# Patient Record
Sex: Male | Born: 1945 | Race: White | Hispanic: No | Marital: Married | Smoking: Former smoker
Health system: Southern US, Community
[De-identification: ages and names within clinical notes are randomized; demographics above are authoritative.]

## PROBLEM LIST (undated history)

## (undated) DIAGNOSIS — K219 Gastro-esophageal reflux disease without esophagitis: Secondary | ICD-10-CM

## (undated) DIAGNOSIS — K649 Unspecified hemorrhoids: Secondary | ICD-10-CM

## (undated) DIAGNOSIS — I10 Essential (primary) hypertension: Secondary | ICD-10-CM

## (undated) DIAGNOSIS — E119 Type 2 diabetes mellitus without complications: Secondary | ICD-10-CM

## (undated) DIAGNOSIS — C801 Malignant (primary) neoplasm, unspecified: Secondary | ICD-10-CM

## (undated) DIAGNOSIS — N400 Enlarged prostate without lower urinary tract symptoms: Secondary | ICD-10-CM

## (undated) DIAGNOSIS — E785 Hyperlipidemia, unspecified: Secondary | ICD-10-CM

## (undated) DIAGNOSIS — G4733 Obstructive sleep apnea (adult) (pediatric): Secondary | ICD-10-CM

## (undated) DIAGNOSIS — Z953 Presence of xenogenic heart valve: Secondary | ICD-10-CM

## (undated) DIAGNOSIS — F329 Major depressive disorder, single episode, unspecified: Secondary | ICD-10-CM

## (undated) DIAGNOSIS — D649 Anemia, unspecified: Secondary | ICD-10-CM

## (undated) DIAGNOSIS — N3010 Interstitial cystitis (chronic) without hematuria: Secondary | ICD-10-CM

## (undated) DIAGNOSIS — E0590 Thyrotoxicosis, unspecified without thyrotoxic crisis or storm: Secondary | ICD-10-CM

## (undated) DIAGNOSIS — K0530 Chronic periodontitis, unspecified: Secondary | ICD-10-CM

## (undated) DIAGNOSIS — K449 Diaphragmatic hernia without obstruction or gangrene: Secondary | ICD-10-CM

## (undated) DIAGNOSIS — K5730 Diverticulosis of large intestine without perforation or abscess without bleeding: Secondary | ICD-10-CM

## (undated) DIAGNOSIS — F32A Depression, unspecified: Secondary | ICD-10-CM

## (undated) DIAGNOSIS — R51 Headache: Secondary | ICD-10-CM

## (undated) DIAGNOSIS — R05 Cough: Secondary | ICD-10-CM

## (undated) DIAGNOSIS — M1990 Unspecified osteoarthritis, unspecified site: Secondary | ICD-10-CM

## (undated) DIAGNOSIS — M109 Gout, unspecified: Secondary | ICD-10-CM

## (undated) DIAGNOSIS — T8203XA Leakage of heart valve prosthesis, initial encounter: Secondary | ICD-10-CM

## (undated) DIAGNOSIS — Z9289 Personal history of other medical treatment: Secondary | ICD-10-CM

## (undated) DIAGNOSIS — K589 Irritable bowel syndrome without diarrhea: Secondary | ICD-10-CM

## (undated) DIAGNOSIS — H269 Unspecified cataract: Secondary | ICD-10-CM

## (undated) DIAGNOSIS — R053 Chronic cough: Secondary | ICD-10-CM

## (undated) DIAGNOSIS — C9000 Multiple myeloma not having achieved remission: Secondary | ICD-10-CM

## (undated) DIAGNOSIS — I350 Nonrheumatic aortic (valve) stenosis: Secondary | ICD-10-CM

## (undated) DIAGNOSIS — J45909 Unspecified asthma, uncomplicated: Secondary | ICD-10-CM

## (undated) DIAGNOSIS — K922 Gastrointestinal hemorrhage, unspecified: Secondary | ICD-10-CM

## (undated) DIAGNOSIS — G4700 Insomnia, unspecified: Secondary | ICD-10-CM

## (undated) DIAGNOSIS — G2581 Restless legs syndrome: Secondary | ICD-10-CM

## (undated) HISTORY — PX: CATARACT EXTRACTION: SUR2

## (undated) HISTORY — DX: Benign prostatic hyperplasia without lower urinary tract symptoms: N40.0

## (undated) HISTORY — DX: Essential (primary) hypertension: I10

## (undated) HISTORY — DX: Unspecified hemorrhoids: K64.9

## (undated) HISTORY — DX: Diaphragmatic hernia without obstruction or gangrene: K44.9

## (undated) HISTORY — PX: HERNIA REPAIR: SHX51

## (undated) HISTORY — DX: Unspecified osteoarthritis, unspecified site: M19.90

## (undated) HISTORY — PX: AORTA - FEMORAL ARTERY BYPASS GRAFT: SUR173

## (undated) HISTORY — DX: Chronic cough: R05.3

## (undated) HISTORY — DX: Gastro-esophageal reflux disease without esophagitis: K21.9

## (undated) HISTORY — PX: REFRACTIVE SURGERY: SHX103

## (undated) HISTORY — DX: Personal history of other medical treatment: Z92.89

## (undated) HISTORY — DX: Depression, unspecified: F32.A

## (undated) HISTORY — PX: NASAL SEPTOPLASTY W/ TURBINOPLASTY: SHX2070

## (undated) HISTORY — DX: Cough: R05

## (undated) HISTORY — PX: OTHER SURGICAL HISTORY: SHX169

## (undated) HISTORY — DX: Leakage of heart valve prosthesis, initial encounter: T82.03XA

## (undated) HISTORY — DX: Unspecified cataract: H26.9

## (undated) HISTORY — DX: Major depressive disorder, single episode, unspecified: F32.9

## (undated) HISTORY — DX: Irritable bowel syndrome, unspecified: K58.9

## (undated) HISTORY — DX: Unspecified asthma, uncomplicated: J45.909

## (undated) HISTORY — PX: CARDIAC SURGERY: SHX584

## (undated) HISTORY — DX: Interstitial cystitis (chronic) without hematuria: N30.10

## (undated) HISTORY — DX: Chronic periodontitis, unspecified: K05.30

## (undated) HISTORY — DX: Gout, unspecified: M10.9

## (undated) HISTORY — DX: Hyperlipidemia, unspecified: E78.5

## (undated) HISTORY — DX: Obstructive sleep apnea (adult) (pediatric): G47.33

## (undated) HISTORY — DX: Insomnia, unspecified: G47.00

## (undated) HISTORY — PX: BUNIONECTOMY: SHX129

---

## 1997-08-13 ENCOUNTER — Encounter: Admission: RE | Admit: 1997-08-13 | Discharge: 1997-08-13 | Payer: Self-pay | Admitting: *Deleted

## 1998-10-18 ENCOUNTER — Emergency Department (HOSPITAL_COMMUNITY): Admission: EM | Admit: 1998-10-18 | Discharge: 1998-10-18 | Payer: Self-pay | Admitting: Emergency Medicine

## 1999-03-30 ENCOUNTER — Inpatient Hospital Stay (HOSPITAL_COMMUNITY): Admission: AD | Admit: 1999-03-30 | Discharge: 1999-03-31 | Payer: Self-pay | Admitting: Cardiology

## 1999-07-09 ENCOUNTER — Encounter: Admission: RE | Admit: 1999-07-09 | Discharge: 1999-07-09 | Payer: Self-pay | Admitting: *Deleted

## 1999-12-10 ENCOUNTER — Inpatient Hospital Stay (HOSPITAL_COMMUNITY): Admission: EM | Admit: 1999-12-10 | Discharge: 1999-12-11 | Payer: Self-pay | Admitting: *Deleted

## 2000-06-07 ENCOUNTER — Encounter: Admission: RE | Admit: 2000-06-07 | Discharge: 2000-06-07 | Payer: Self-pay | Admitting: Family Medicine

## 2000-06-07 ENCOUNTER — Encounter: Payer: Self-pay | Admitting: Family Medicine

## 2002-11-30 ENCOUNTER — Emergency Department (HOSPITAL_COMMUNITY): Admission: EM | Admit: 2002-11-30 | Discharge: 2002-11-30 | Payer: Self-pay | Admitting: Emergency Medicine

## 2004-09-09 ENCOUNTER — Ambulatory Visit: Payer: Self-pay

## 2004-09-10 ENCOUNTER — Ambulatory Visit: Payer: Self-pay | Admitting: Cardiology

## 2004-09-13 ENCOUNTER — Ambulatory Visit: Payer: Self-pay | Admitting: Internal Medicine

## 2004-09-13 ENCOUNTER — Inpatient Hospital Stay (HOSPITAL_BASED_OUTPATIENT_CLINIC_OR_DEPARTMENT_OTHER): Admission: RE | Admit: 2004-09-13 | Discharge: 2004-09-13 | Payer: Self-pay | Admitting: Cardiology

## 2004-09-15 ENCOUNTER — Ambulatory Visit: Payer: Self-pay | Admitting: Cardiology

## 2004-09-30 ENCOUNTER — Encounter (HOSPITAL_COMMUNITY): Admission: RE | Admit: 2004-09-30 | Discharge: 2004-09-30 | Payer: Self-pay | Admitting: Dentistry

## 2004-09-30 ENCOUNTER — Ambulatory Visit: Payer: Self-pay | Admitting: Dentistry

## 2004-10-06 ENCOUNTER — Ambulatory Visit: Payer: Self-pay | Admitting: Dentistry

## 2004-10-13 ENCOUNTER — Inpatient Hospital Stay (HOSPITAL_COMMUNITY)
Admission: RE | Admit: 2004-10-13 | Discharge: 2004-10-18 | Payer: Self-pay | Admitting: Thoracic Surgery (Cardiothoracic Vascular Surgery)

## 2004-10-13 ENCOUNTER — Encounter (INDEPENDENT_AMBULATORY_CARE_PROVIDER_SITE_OTHER): Payer: Self-pay | Admitting: Specialist

## 2004-10-13 HISTORY — PX: AORTIC VALVE REPLACEMENT: SHX41

## 2004-10-22 ENCOUNTER — Ambulatory Visit: Payer: Self-pay | Admitting: Cardiology

## 2004-10-29 ENCOUNTER — Ambulatory Visit: Payer: Self-pay | Admitting: Cardiology

## 2004-10-31 ENCOUNTER — Emergency Department (HOSPITAL_COMMUNITY): Admission: EM | Admit: 2004-10-31 | Discharge: 2004-11-01 | Payer: Self-pay | Admitting: Emergency Medicine

## 2004-11-02 ENCOUNTER — Ambulatory Visit: Payer: Self-pay | Admitting: Internal Medicine

## 2004-11-02 ENCOUNTER — Inpatient Hospital Stay (HOSPITAL_COMMUNITY): Admission: EM | Admit: 2004-11-02 | Discharge: 2004-11-10 | Payer: Self-pay | Admitting: Emergency Medicine

## 2004-11-02 ENCOUNTER — Ambulatory Visit: Payer: Self-pay | Admitting: Cardiology

## 2004-11-03 ENCOUNTER — Encounter: Payer: Self-pay | Admitting: Cardiology

## 2004-11-08 ENCOUNTER — Encounter: Payer: Self-pay | Admitting: Cardiology

## 2004-11-24 ENCOUNTER — Ambulatory Visit: Payer: Self-pay | Admitting: Gastroenterology

## 2004-11-26 ENCOUNTER — Ambulatory Visit: Payer: Self-pay | Admitting: Cardiology

## 2004-12-02 ENCOUNTER — Encounter (HOSPITAL_COMMUNITY): Admission: RE | Admit: 2004-12-02 | Discharge: 2005-03-02 | Payer: Self-pay | Admitting: Cardiology

## 2005-01-03 ENCOUNTER — Ambulatory Visit: Payer: Self-pay | Admitting: Cardiology

## 2005-01-11 ENCOUNTER — Ambulatory Visit: Payer: Self-pay | Admitting: Cardiology

## 2005-02-03 ENCOUNTER — Ambulatory Visit: Payer: Self-pay | Admitting: Cardiology

## 2005-03-03 ENCOUNTER — Encounter (HOSPITAL_COMMUNITY): Admission: RE | Admit: 2005-03-03 | Discharge: 2005-06-01 | Payer: Self-pay | Admitting: Cardiology

## 2005-07-14 ENCOUNTER — Encounter: Admission: RE | Admit: 2005-07-14 | Discharge: 2005-07-14 | Payer: Self-pay | Admitting: Family Medicine

## 2005-07-18 ENCOUNTER — Ambulatory Visit: Payer: Self-pay | Admitting: Cardiology

## 2005-12-05 ENCOUNTER — Encounter: Admission: RE | Admit: 2005-12-05 | Discharge: 2005-12-05 | Payer: Self-pay | Admitting: Family Medicine

## 2005-12-19 ENCOUNTER — Ambulatory Visit: Payer: Self-pay | Admitting: Cardiology

## 2005-12-30 ENCOUNTER — Ambulatory Visit: Payer: Self-pay

## 2005-12-30 ENCOUNTER — Encounter: Payer: Self-pay | Admitting: Cardiology

## 2006-01-23 ENCOUNTER — Encounter: Admission: RE | Admit: 2006-01-23 | Discharge: 2006-01-23 | Payer: Self-pay | Admitting: Family Medicine

## 2006-03-17 ENCOUNTER — Ambulatory Visit: Payer: Self-pay | Admitting: Cardiology

## 2006-04-05 ENCOUNTER — Ambulatory Visit: Payer: Self-pay | Admitting: Emergency Medicine

## 2006-05-08 ENCOUNTER — Encounter: Admission: RE | Admit: 2006-05-08 | Discharge: 2006-05-08 | Payer: Self-pay | Admitting: Family Medicine

## 2006-05-17 ENCOUNTER — Ambulatory Visit: Payer: Self-pay | Admitting: Emergency Medicine

## 2006-06-20 ENCOUNTER — Ambulatory Visit: Payer: Self-pay | Admitting: Emergency Medicine

## 2006-06-27 ENCOUNTER — Ambulatory Visit (HOSPITAL_COMMUNITY): Admission: RE | Admit: 2006-06-27 | Discharge: 2006-06-27 | Payer: Self-pay | Admitting: Emergency Medicine

## 2006-08-01 ENCOUNTER — Ambulatory Visit: Payer: Self-pay | Admitting: Emergency Medicine

## 2006-08-08 ENCOUNTER — Encounter: Admission: RE | Admit: 2006-08-08 | Discharge: 2006-08-08 | Payer: Self-pay | Admitting: Neurology

## 2006-08-10 ENCOUNTER — Ambulatory Visit: Payer: Self-pay | Admitting: Internal Medicine

## 2006-09-08 ENCOUNTER — Ambulatory Visit: Payer: Self-pay | Admitting: Emergency Medicine

## 2006-09-11 ENCOUNTER — Ambulatory Visit: Payer: Self-pay | Admitting: Internal Medicine

## 2006-09-15 ENCOUNTER — Ambulatory Visit: Admission: RE | Admit: 2006-09-15 | Discharge: 2006-09-15 | Payer: Self-pay | Admitting: Emergency Medicine

## 2006-09-15 ENCOUNTER — Ambulatory Visit: Payer: Self-pay | Admitting: Emergency Medicine

## 2006-10-18 ENCOUNTER — Ambulatory Visit: Payer: Self-pay | Admitting: Internal Medicine

## 2006-10-20 ENCOUNTER — Ambulatory Visit: Payer: Self-pay | Admitting: Emergency Medicine

## 2006-11-07 ENCOUNTER — Ambulatory Visit (HOSPITAL_BASED_OUTPATIENT_CLINIC_OR_DEPARTMENT_OTHER): Admission: RE | Admit: 2006-11-07 | Discharge: 2006-11-07 | Payer: Self-pay | Admitting: Emergency Medicine

## 2006-11-18 ENCOUNTER — Ambulatory Visit: Payer: Self-pay | Admitting: Pulmonary Disease

## 2006-11-27 ENCOUNTER — Ambulatory Visit: Payer: Self-pay | Admitting: Internal Medicine

## 2006-12-02 DIAGNOSIS — N400 Enlarged prostate without lower urinary tract symptoms: Secondary | ICD-10-CM

## 2006-12-02 DIAGNOSIS — Z952 Presence of prosthetic heart valve: Secondary | ICD-10-CM | POA: Insufficient documentation

## 2006-12-02 DIAGNOSIS — R05 Cough: Secondary | ICD-10-CM

## 2006-12-02 DIAGNOSIS — M1990 Unspecified osteoarthritis, unspecified site: Secondary | ICD-10-CM | POA: Insufficient documentation

## 2006-12-04 ENCOUNTER — Ambulatory Visit: Payer: Self-pay | Admitting: Emergency Medicine

## 2006-12-05 ENCOUNTER — Encounter: Payer: Self-pay | Admitting: Internal Medicine

## 2006-12-05 ENCOUNTER — Ambulatory Visit (HOSPITAL_COMMUNITY): Admission: RE | Admit: 2006-12-05 | Discharge: 2006-12-05 | Payer: Self-pay | Admitting: Internal Medicine

## 2006-12-08 ENCOUNTER — Ambulatory Visit: Payer: Self-pay | Admitting: Internal Medicine

## 2006-12-13 ENCOUNTER — Ambulatory Visit (HOSPITAL_BASED_OUTPATIENT_CLINIC_OR_DEPARTMENT_OTHER): Admission: RE | Admit: 2006-12-13 | Discharge: 2006-12-13 | Payer: Self-pay | Admitting: Emergency Medicine

## 2006-12-19 ENCOUNTER — Ambulatory Visit: Payer: Self-pay | Admitting: Pulmonary Disease

## 2006-12-27 ENCOUNTER — Ambulatory Visit: Payer: Self-pay | Admitting: Cardiology

## 2006-12-29 ENCOUNTER — Telehealth (INDEPENDENT_AMBULATORY_CARE_PROVIDER_SITE_OTHER): Payer: Self-pay | Admitting: *Deleted

## 2007-01-01 ENCOUNTER — Encounter: Payer: Self-pay | Admitting: Cardiology

## 2007-01-01 ENCOUNTER — Ambulatory Visit: Payer: Self-pay

## 2007-01-12 ENCOUNTER — Telehealth (INDEPENDENT_AMBULATORY_CARE_PROVIDER_SITE_OTHER): Payer: Self-pay | Admitting: *Deleted

## 2007-01-15 ENCOUNTER — Ambulatory Visit: Payer: Self-pay | Admitting: Emergency Medicine

## 2007-01-15 DIAGNOSIS — G4700 Insomnia, unspecified: Secondary | ICD-10-CM | POA: Insufficient documentation

## 2007-01-15 DIAGNOSIS — G4733 Obstructive sleep apnea (adult) (pediatric): Secondary | ICD-10-CM

## 2007-01-15 DIAGNOSIS — J309 Allergic rhinitis, unspecified: Secondary | ICD-10-CM | POA: Insufficient documentation

## 2007-01-23 ENCOUNTER — Ambulatory Visit: Payer: Self-pay | Admitting: Cardiology

## 2007-01-23 LAB — CONVERTED CEMR LAB
BUN: 7 mg/dL (ref 6–23)
CO2: 31 meq/L (ref 19–32)
Calcium: 9.1 mg/dL (ref 8.4–10.5)
GFR calc Af Amer: 111 mL/min
GFR calc non Af Amer: 91 mL/min
Glucose, Bld: 187 mg/dL — ABNORMAL HIGH (ref 70–99)
Potassium: 3.3 meq/L — ABNORMAL LOW (ref 3.5–5.1)

## 2007-01-29 ENCOUNTER — Ambulatory Visit: Payer: Self-pay | Admitting: Cardiology

## 2007-01-29 LAB — CONVERTED CEMR LAB
Calcium: 9.5 mg/dL (ref 8.4–10.5)
Chloride: 95 meq/L — ABNORMAL LOW (ref 96–112)
GFR calc Af Amer: 111 mL/min
GFR calc non Af Amer: 91 mL/min
Glucose, Bld: 145 mg/dL — ABNORMAL HIGH (ref 70–99)
Sodium: 132 meq/L — ABNORMAL LOW (ref 135–145)

## 2007-02-08 DIAGNOSIS — D649 Anemia, unspecified: Secondary | ICD-10-CM

## 2007-02-08 HISTORY — DX: Anemia, unspecified: D64.9

## 2007-02-15 ENCOUNTER — Telehealth: Payer: Self-pay | Admitting: Emergency Medicine

## 2007-03-07 ENCOUNTER — Ambulatory Visit: Payer: Self-pay | Admitting: Cardiology

## 2007-03-07 ENCOUNTER — Observation Stay (HOSPITAL_COMMUNITY): Admission: EM | Admit: 2007-03-07 | Discharge: 2007-03-09 | Payer: Self-pay | Admitting: Emergency Medicine

## 2007-04-17 ENCOUNTER — Ambulatory Visit: Payer: Self-pay | Admitting: Internal Medicine

## 2007-04-25 ENCOUNTER — Telehealth: Payer: Self-pay | Admitting: Emergency Medicine

## 2007-05-16 ENCOUNTER — Ambulatory Visit: Payer: Self-pay | Admitting: Cardiovascular Disease

## 2007-05-16 ENCOUNTER — Ambulatory Visit: Payer: Self-pay | Admitting: Cardiology

## 2007-05-16 LAB — CONVERTED CEMR LAB
Calcium: 9.5 mg/dL (ref 8.4–10.5)
Creatinine, Ser: 1 mg/dL (ref 0.4–1.5)
GFR calc non Af Amer: 81 mL/min
Sodium: 138 meq/L (ref 135–145)

## 2007-06-01 ENCOUNTER — Encounter: Payer: Self-pay | Admitting: Emergency Medicine

## 2007-06-27 ENCOUNTER — Ambulatory Visit: Payer: Self-pay | Admitting: Cardiology

## 2007-07-23 ENCOUNTER — Ambulatory Visit: Payer: Self-pay | Admitting: Internal Medicine

## 2007-07-26 ENCOUNTER — Ambulatory Visit: Payer: Self-pay | Admitting: Cardiology

## 2007-10-28 ENCOUNTER — Emergency Department (HOSPITAL_COMMUNITY): Admission: EM | Admit: 2007-10-28 | Discharge: 2007-10-28 | Payer: Self-pay | Admitting: Emergency Medicine

## 2007-11-09 ENCOUNTER — Ambulatory Visit (HOSPITAL_BASED_OUTPATIENT_CLINIC_OR_DEPARTMENT_OTHER): Admission: RE | Admit: 2007-11-09 | Discharge: 2007-11-09 | Payer: Self-pay | Admitting: Urology

## 2007-11-09 ENCOUNTER — Encounter (INDEPENDENT_AMBULATORY_CARE_PROVIDER_SITE_OTHER): Payer: Self-pay | Admitting: Urology

## 2008-06-02 ENCOUNTER — Telehealth: Payer: Self-pay | Admitting: Cardiology

## 2008-07-02 DIAGNOSIS — I10 Essential (primary) hypertension: Secondary | ICD-10-CM | POA: Insufficient documentation

## 2008-07-02 DIAGNOSIS — K219 Gastro-esophageal reflux disease without esophagitis: Secondary | ICD-10-CM | POA: Insufficient documentation

## 2008-07-02 DIAGNOSIS — E785 Hyperlipidemia, unspecified: Secondary | ICD-10-CM

## 2008-09-19 ENCOUNTER — Inpatient Hospital Stay (HOSPITAL_COMMUNITY): Admission: EM | Admit: 2008-09-19 | Discharge: 2008-09-20 | Payer: Self-pay | Admitting: Emergency Medicine

## 2008-09-24 ENCOUNTER — Ambulatory Visit: Payer: Self-pay | Admitting: Gastroenterology

## 2008-11-20 ENCOUNTER — Telehealth: Payer: Self-pay | Admitting: Cardiology

## 2009-01-05 ENCOUNTER — Observation Stay (HOSPITAL_COMMUNITY): Admission: EM | Admit: 2009-01-05 | Discharge: 2009-01-06 | Payer: Self-pay | Admitting: Emergency Medicine

## 2009-02-09 ENCOUNTER — Ambulatory Visit: Payer: Self-pay | Admitting: Cardiovascular Disease

## 2009-02-10 ENCOUNTER — Ambulatory Visit: Payer: Self-pay | Admitting: Surgery

## 2009-02-10 ENCOUNTER — Inpatient Hospital Stay (HOSPITAL_COMMUNITY): Admission: EM | Admit: 2009-02-10 | Discharge: 2009-02-11 | Payer: Self-pay | Admitting: Emergency Medicine

## 2009-02-10 ENCOUNTER — Encounter (INDEPENDENT_AMBULATORY_CARE_PROVIDER_SITE_OTHER): Payer: Self-pay | Admitting: Internal Medicine

## 2009-03-30 ENCOUNTER — Telehealth: Payer: Self-pay | Admitting: Cardiology

## 2009-03-31 ENCOUNTER — Ambulatory Visit: Payer: Self-pay | Admitting: Cardiology

## 2009-06-29 ENCOUNTER — Telehealth (INDEPENDENT_AMBULATORY_CARE_PROVIDER_SITE_OTHER): Payer: Self-pay | Admitting: *Deleted

## 2009-07-02 ENCOUNTER — Encounter (INDEPENDENT_AMBULATORY_CARE_PROVIDER_SITE_OTHER): Payer: Self-pay | Admitting: *Deleted

## 2009-07-22 ENCOUNTER — Encounter: Payer: Self-pay | Admitting: Cardiology

## 2010-01-05 ENCOUNTER — Encounter (INDEPENDENT_AMBULATORY_CARE_PROVIDER_SITE_OTHER): Payer: Self-pay | Admitting: *Deleted

## 2010-02-28 ENCOUNTER — Encounter: Payer: Self-pay | Admitting: Cardiology

## 2010-03-09 ENCOUNTER — Encounter: Payer: Self-pay | Admitting: Cardiology

## 2010-03-09 NOTE — Progress Notes (Signed)
Summary: b/p machine - irregular heart beat  Phone Note Call from Patient Call back at Home Phone [phone]   Caller: Patient Reason for Call: Talk to Nurse Details for Reason: Per pt calling, pt took his  b/p  by machine , the machine telling pt he's irregular heart beat. bp reading last night 132/83.  Initial call taken by: Lorne Skeens,  March 30, 2009 9:39 AM  Follow-up for Phone Call        spoke with pt, he currently has no insurance and has been seeing the [place]. he is concerned about his bp and heart. he has been using a home monitor and when he checks his heart rate it will say irregular heart beats detected. last night and this am it was not there, he checked his pulse and it feels regular at present. his bp is 115/71. he wants to make a follow up appt to see dr Jens Som. appt made for this week.  Follow-up by: Deliah Goody, RN,  March 30, 2009 11:40 AM

## 2010-03-09 NOTE — Assessment & Plan Note (Signed)
Summary: ROV/F/U BP AND HEART RATE/DM   Primary Provider:  Marcy Panning   History of Present Illness: [name] is a pleasant gentleman who has a history of aortic valve replacement with porcine valve.  His most recent echocardiogram was performed in January of 2011. He had hyperdynamic LV function. There was a mildly elevated gradient across his prosthetic aortic valve of 18 mm of mercury. His right atrium and right ventricle were mildly enlarged.  He did have a CTA of his aorta on July 23, 2007.  There was further evolution of the postsurgical changes, status post aortic valve replacement and grafting.  There was no recurrent aneurysm or dissection.  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  Since I last saw him in June of 2009 he has been admitted with problems with orthostasis and anemia. He occasionally has dyspnea but this is not clearly with exertion. There is no associated chest pain. There is no orthopnea or PND but there is occasional mild pedal edema. He has had occasional syncopal episodes. These typically occur with cough or laughing hard. There was an episode earlier this year when he was having diarrhea and was dehydrated. He is not having palpitations but does note an irregular heart beat on his monitor.  Current Medications (verified): 1)  Cozaar 100 Mg Tabs (Losartan Potassium) .Marland Kitchen.. 1 Tab By Mouth Once Daily 2)  Metoprolol Tartrate 50 Mg Tabs (Metoprolol Tartrate) .... Take One Tablet By Mouth Twice A Day 3)  Zegerid 40-1100 Mg  Caps (Omeprazole-Sodium Bicarbonate) .... Take 1 Capsule By Mouth Two Times A Day 4)  Proair Hfa 108 (90 Base) Mcg/act  Aers (Albuterol Sulfate) .... Inhale 2 Puffs Every 4 Hrs As Needed 5)  Adult Aspirin Low Strength 81 Mg  Tbdp (Aspirin) .... Take 1 Tablet By Mouth Once A Day 6)  Amlodipine Besylate 10 Mg Tabs (Amlodipine Besylate) .... Take One Tablet By Mouth Daily 7)  Acetaminophen 500 Mg  Caps (Acetaminophen) .... 2 Tab By Mouth As Needed 8)  Finasteride 5 Mg Tabs (Finasteride) .... Monthly 9)  Ropinirole Hcl 0.5 Mg Tabs (Ropinirole Hcl) .... 3 Tabs By Mouth At Bedtime 10)  Ferrous Sulfate 325 (65 Fe) Mg  Tabs (Ferrous Sulfate) .Marland Kitchen.. 1 Tab By Mouth Once Daily 11)  Potassium 99 Mg Tabs (Potassium) .Marland Kitchen.. 1 Tab By Mouth Once Daily 12)  Vitamin C 500 Mg Tabs (Ascorbic Acid) .Marland Kitchen.. 1 Tab By Mouth Once Daily 13)  Ketotifen Fumarate 0.025 % Soln (Ketotifen Fumarate) .... Both Eyes 14)  Artificial Tears  Soln (Artificial Tear Solution) .... As Directed 15)  Tussin Dm 100-10 Mg/48ml Syrp (Dextromethorphan-Guaifenesin) .... As Needed  Allergies: 1)  ! Codeine 2)  ! Avodart (Dutasteride) 3)  Codeine Phosphate (Codeine Phosphate)  Past History:  Past Medical History: HYPERTENSION (ICD-401.9) GERD (ICD-530.81) HYPERLIPIDEMIA-MIXED (ICD-272.4) EXTERNAL HEMORRHOIDS (ICD-455.3) INSOMNIA (ICD-780.52) ALLERGIC  RHINITIS (ICD-477.9) OBSTRUCTIVE SLEEP APNEA (ICD-327.23) OSTEOARTHRITIS (ICD-715.90) HIATAL HERNIA (ICD-553.3) AORTIC VALVE REPLACEMENT, HX OF (ICD-V43.3) BENIGN PROSTATIC HYPERTROPHY, HX OF (ICD-V13.8) COUGH, CHRONIC (ICD-786.2) History of cough syncope  Past Surgical History: Reviewed history from 04/30/2007 and no changes required. NASAL SEPTOPLASTY HERNIA REPAIR RT KNEE ARTHROSCOPY BUNIONECTOMY  Social History: Reviewed history from 07/02/2008 and no changes required. Married  Tobacco Use - Former.  Alcohol Use - no Regular Exercise - no Drug Use - no  Review of Systems       Dyspnea and Nonproductive Cough but no fevers or chills, productive cough, hemoptysis, dysphasia, odynophagia,  melena, hematochezia, dysuria, hematuria, rash, seizure activity, orthopnea, PND,  claudication. Remaining systems are negative.   Vital Signs:  Patient profile:   65 year old male Height:      70 inches Weight:      215 pounds BMI:     30.96 Pulse rate:   60 /  minute Resp:     12 per minute BP sitting:   120 / 90  (left arm)  Vitals Entered By: [name] (March 31, 2009 8:41 AM)  Physical Exam  General:  Well-developed well-nourished in no acute distress.  Skin is warm and dry.  HEENT is normal.  Neck is supple. No thyromegaly.  Chest is clear to auscultation with normal expansion.  Cardiovascular exam is regular rate and rhythm. 2-3/6 systolic murmur left sternal border. No diastolic murmur. Abdominal exam nontender or distended. No masses palpated. Extremities show no edema. neuro grossly intact    EKG  Procedure date:  03/31/2009  Findings:      Sinus rhythm at a rate of 60. Occasional PVC. No ST changes.  Impression & Recommendations:  Problem # 1:  HYPERTENSION (ICD-401.9) Blood pressure mildly elevated. He will follow this at home. If his diastolic remains greater than 85 then we will consider adding additional medications. I am hesitant to do this at present given his history of syncope and apparently he has had multiple medication adjustments recently. The following medications were removed from the medication list:    Hydrochlorothiazide 12.5 Mg Tabs (Hydrochlorothiazide) .Marland Kitchen... 1 tab by mouth two times a day His updated medication list for this problem includes:    Cozaar 100 Mg Tabs (Losartan potassium) .Marland Kitchen... 1 tab by mouth once daily    Metoprolol Tartrate 50 Mg Tabs (Metoprolol tartrate) .Marland Kitchen... Take one tablet by mouth twice a day    Adult Aspirin Low Strength 81 Mg Tbdp (Aspirin) .Marland Kitchen... Take 1 tablet by mouth once a day    Amlodipine Besylate 10 Mg Tabs (Amlodipine besylate) .Marland Kitchen... Take one tablet by mouth daily  Problem # 2:  HYPERLIPIDEMIA-MIXED (ICD-272.4) Lipids and liver monitored by primary care. The following medications were removed from the medication list:    Lescol Xl 80 Mg Tb24 (Fluvastatin sodium) .Marland Kitchen... Take 3 tablets once daily  Problem # 3:  AORTIC VALVE REPLACEMENT, HX OF (ICD-V43.3) Recent  echocardiogram noted above. Continue SBE prophylaxis. I will most likely repeat his CT scan in one year when he returns.  Problem # 4:  COUGH SYNCOPE (ICD-786.2) Patient has had problems with this previously. His LV function is normal. His syncope always occurs with coughing or laughing. Treatment would be therapy for cough. His updated medication list for this problem includes:    Metoprolol Tartrate 50 Mg Tabs (Metoprolol tartrate) .Marland Kitchen... Take one tablet by mouth twice a day    Adult Aspirin Low Strength 81 Mg Tbdp (Aspirin) .Marland Kitchen... Take 1 tablet by mouth once a day    Amlodipine Besylate 10 Mg Tabs (Amlodipine besylate) .Marland Kitchen... Take one tablet by mouth daily  Problem # 5:  BENIGN PROSTATIC HYPERTROPHY, HX OF (ICD-V13.8)  Problem # 6:  OBSTRUCTIVE SLEEP APNEA (ICD-327.23)  Problem # 7:  GERD (ICD-530.81)  His updated medication list for this problem includes:    Zegerid 40-1100 Mg Caps (Omeprazole-sodium bicarbonate) .Marland Kitchen... Take 1 capsule by mouth two times a day  Patient Instructions: 1)  Your physician recommends that you schedule a follow-up appointment in: ONE YEAR

## 2010-03-09 NOTE — Progress Notes (Signed)
  Walk in Patient Form Recieved " Pt left Self typed Letter" sent to Message Nurse" [facility]  Jun 29, 2009 10:57 AM

## 2010-03-09 NOTE — Letter (Signed)
Summary: Colonoscopy Letter  [facility]  [address] [place], [place] [address]   Phone: [phone]  Fax: [phone]      January 05, 2010 MRN: [id number]   LAMERE LIGHTNER [address] [place], [place]  [address]   Dear Mr. Galeana,   According to your medical record, it is time for you to schedule a Colonoscopy. The American Cancer Society recommends this procedure as a method to detect early colon cancer. Patients with a family history of colon cancer, or a personal history of colon polyps or inflammatory bowel disease are at increased risk.  This letter has been generated based on the recommendations made at the time of your procedure. If you feel that in your particular situation this may no longer apply, please contact our office.  Please call our office at [phone] to schedule this appointment or to update your records at your earliest convenience.  Thank you for cooperating with [place] to provide you with the very best care possible.   Sincerely,   Iva Boop, M.D.  [facility] [phone]

## 2010-03-09 NOTE — Letter (Signed)
Summary: Generic Letter  [facility] Main Office  [address]   [place], [place] [address]   Phone: [phone]  Fax: [phone]        Jul 02, 2009 MRN: [id number]    [name] [address] [place], [place]  [address]    To Whom it May Concern,          [name] does not carry a diagnosis of Ischemic Heart Disease. His cardiac catherization in 2006, at the time of his valve replacement surgery, showed normal coronary arteries. Please contact us with any questions or concerns.   Sincerely,  Deliah Goody, RN/Dr [name]

## 2010-04-25 LAB — POCT CARDIAC MARKERS
CKMB, poc: 4.4 ng/mL (ref 1.0–8.0)
Myoglobin, poc: 123 ng/mL (ref 12–200)
Troponin i, poc: <0.05 ng/mL (ref 0.00–0.09)

## 2010-04-25 LAB — GLUCOSE, CAPILLARY
Glucose-Capillary: 100 mg/dL — ABNORMAL HIGH (ref 70–99)
Glucose-Capillary: 112 mg/dL — ABNORMAL HIGH (ref 70–99)
Glucose-Capillary: 113 mg/dL — ABNORMAL HIGH (ref 70–99)
Glucose-Capillary: 121 mg/dL — ABNORMAL HIGH (ref 70–99)
Glucose-Capillary: 123 mg/dL — ABNORMAL HIGH (ref 70–99)
Glucose-Capillary: 133 mg/dL — ABNORMAL HIGH (ref 70–99)
Glucose-Capillary: 133 mg/dL — ABNORMAL HIGH (ref 70–99)
Glucose-Capillary: 141 mg/dL — ABNORMAL HIGH (ref 70–99)

## 2010-04-25 LAB — DIFFERENTIAL
Basophils Absolute: 0 10*3/uL (ref 0.0–0.1)
Eosinophils Relative: 0 % (ref 0–5)
Lymphocytes Relative: 13 % (ref 12–46)
Lymphs Abs: 1.1 10*3/uL (ref 0.7–4.0)
Neutro Abs: 7.2 10*3/uL (ref 1.7–7.7)
Neutrophils Relative %: 82 % — ABNORMAL HIGH (ref 43–77)

## 2010-04-25 LAB — CBC
HCT: 35.4 % — ABNORMAL LOW (ref 39.0–52.0)
Hemoglobin: 12.5 g/dL — ABNORMAL LOW (ref 13.0–17.0)
MCHC: 35.2 g/dL (ref 30.0–36.0)
Platelets: 115 10*3/uL — ABNORMAL LOW (ref 150–400)
RBC: 4.03 MIL/uL — ABNORMAL LOW (ref 4.22–5.81)
RDW: 13.4 % (ref 11.5–15.5)
RDW: 13.9 % (ref 11.5–15.5)
WBC: 7.2 10*3/uL (ref 4.0–10.5)
WBC: 8.8 10*3/uL (ref 4.0–10.5)

## 2010-04-25 LAB — CARDIAC PANEL(CRET KIN+CKTOT+MB+TROPI)
CK, MB: 7 ng/mL (ref 0.3–4.0)
CK, MB: 7.6 ng/mL (ref 0.3–4.0)
Relative Index: 3.8 — ABNORMAL HIGH (ref 0.0–2.5)
Total CK: 177 U/L (ref 7–232)
Total CK: 264 U/L — ABNORMAL HIGH (ref 7–232)
Troponin I: 0.02 ng/mL (ref 0.00–0.06)

## 2010-04-25 LAB — FECAL LACTOFERRIN, QUANT: Fecal Lactoferrin: NEGATIVE

## 2010-04-25 LAB — URINALYSIS, ROUTINE W REFLEX MICROSCOPIC
Glucose, UA: NEGATIVE mg/dL
Ketones, ur: NEGATIVE mg/dL
Protein, ur: NEGATIVE mg/dL
Urobilinogen, UA: 1 mg/dL (ref 0.0–1.0)

## 2010-04-25 LAB — BASIC METABOLIC PANEL
CO2: 26 mEq/L (ref 19–32)
CO2: 28 mEq/L (ref 19–32)
Chloride: 102 mEq/L (ref 96–112)
GFR calc Af Amer: >60 mL/min (ref >60)
Glucose, Bld: 120 mg/dL — ABNORMAL HIGH (ref 70–99)
Potassium: 3.5 mEq/L (ref 3.5–5.1)
Sodium: 134 mEq/L — ABNORMAL LOW (ref 135–145)
Sodium: 136 mEq/L (ref 135–145)

## 2010-04-25 LAB — CK TOTAL AND CKMB (NOT AT ARMC)
CK, MB: 6.9 ng/mL (ref 0.3–4.0)
Relative Index: 5.2 — ABNORMAL HIGH (ref 0.0–2.5)
Total CK: 133 U/L (ref 7–232)

## 2010-04-25 LAB — CULTURE, BLOOD (ROUTINE X 2)
Culture: NO GROWTH
Culture: NO GROWTH

## 2010-04-25 LAB — POCT I-STAT, CHEM 8
BUN: 9 mg/dL (ref 6–23)
Calcium, Ion: 1.16 mmol/L (ref 1.12–1.32)
Chloride: 96 mEq/L (ref 96–112)
Creatinine, Ser: 0.9 mg/dL (ref 0.4–1.5)
Glucose, Bld: 166 mg/dL — ABNORMAL HIGH (ref 70–99)
HCT: 47 % (ref 39.0–52.0)
Hemoglobin: 16 g/dL (ref 13.0–17.0)
Potassium: 3.5 mEq/L (ref 3.5–5.1)
Sodium: 133 mEq/L — ABNORMAL LOW (ref 135–145)
TCO2: 28 mmol/L (ref 0–100)

## 2010-04-25 LAB — CLOSTRIDIUM DIFFICILE EIA: C difficile Toxins A+B, EIA: NEGATIVE

## 2010-04-25 LAB — HEMOGLOBIN A1C
Hgb A1c MFr Bld: 7 % — ABNORMAL HIGH (ref 4.6–6.1)
Mean Plasma Glucose: 154 mg/dL

## 2010-04-25 LAB — STOOL CULTURE

## 2010-04-25 LAB — HIV ANTIBODY (ROUTINE TESTING W REFLEX): HIV: NONREACTIVE

## 2010-04-25 LAB — TROPONIN I: Troponin I: 0.03 ng/mL (ref 0.00–0.06)

## 2010-05-12 LAB — URINALYSIS, ROUTINE W REFLEX MICROSCOPIC
Bilirubin Urine: NEGATIVE
Glucose, UA: NEGATIVE mg/dL
Hgb urine dipstick: NEGATIVE
Ketones, ur: NEGATIVE mg/dL
Protein, ur: NEGATIVE mg/dL

## 2010-05-12 LAB — CBC
MCHC: 34 g/dL (ref 30.0–36.0)
Platelets: 157 10*3/uL (ref 150–400)
RBC: 4.63 MIL/uL (ref 4.22–5.81)
RBC: 4.83 MIL/uL (ref 4.22–5.81)
WBC: 7.8 10*3/uL (ref 4.0–10.5)

## 2010-05-12 LAB — DIFFERENTIAL
Eosinophils Absolute: 0.1 10*3/uL (ref 0.0–0.7)
Eosinophils Relative: 2 % (ref 0–5)
Lymphs Abs: 1.6 10*3/uL (ref 0.7–4.0)

## 2010-05-12 LAB — COMPREHENSIVE METABOLIC PANEL
ALT: 37 U/L (ref 0–53)
AST: 44 U/L — ABNORMAL HIGH (ref 0–37)
CO2: 30 mEq/L (ref 19–32)
Calcium: 9.5 mg/dL (ref 8.4–10.5)
Chloride: 95 mEq/L — ABNORMAL LOW (ref 96–112)
GFR calc Af Amer: >60 mL/min (ref >60)
GFR calc non Af Amer: >60 mL/min (ref >60)
Sodium: 133 mEq/L — ABNORMAL LOW (ref 135–145)
Total Bilirubin: 0.6 mg/dL (ref 0.3–1.2)

## 2010-05-12 LAB — POCT CARDIAC MARKERS
CKMB, poc: 2.7 ng/mL (ref 1.0–8.0)
Myoglobin, poc: 127 ng/mL (ref 12–200)
Troponin i, poc: <0.05 ng/mL (ref 0.00–0.09)

## 2010-05-12 LAB — BASIC METABOLIC PANEL
CO2: 28 mEq/L (ref 19–32)
Calcium: 9.1 mg/dL (ref 8.4–10.5)
Chloride: 100 mEq/L (ref 96–112)
GFR calc Af Amer: >60 mL/min (ref >60)
Sodium: 135 mEq/L (ref 135–145)

## 2010-05-12 LAB — POCT I-STAT, CHEM 8
Chloride: 92 mEq/L — ABNORMAL LOW (ref 96–112)
Glucose, Bld: 153 mg/dL — ABNORMAL HIGH (ref 70–99)
HCT: 44 % (ref 39.0–52.0)
Potassium: 3.4 mEq/L — ABNORMAL LOW (ref 3.5–5.1)
Sodium: 133 mEq/L — ABNORMAL LOW (ref 135–145)

## 2010-05-12 LAB — HEMOCCULT GUIAC POC 1CARD (OFFICE): Fecal Occult Bld: POSITIVE

## 2010-05-12 LAB — PROTIME-INR: Prothrombin Time: 13.1 seconds (ref 11.6–15.2)

## 2010-05-12 LAB — CROSSMATCH: Antibody Screen: NEGATIVE

## 2010-05-12 LAB — CARDIAC PANEL(CRET KIN+CKTOT+MB+TROPI)
CK, MB: 3.9 ng/mL (ref 0.3–4.0)
Total CK: 113 U/L (ref 7–232)
Total CK: 123 U/L (ref 7–232)
Troponin I: 0.02 ng/mL (ref 0.00–0.06)
Troponin I: 0.03 ng/mL (ref 0.00–0.06)

## 2010-05-12 LAB — HEMOGLOBIN A1C: Mean Plasma Glucose: 148 mg/dL

## 2010-05-16 LAB — COMPREHENSIVE METABOLIC PANEL
ALT: 33 U/L (ref 0–53)
AST: 47 U/L — ABNORMAL HIGH (ref 0–37)
Alkaline Phosphatase: 61 U/L (ref 39–117)
BUN: 13 mg/dL (ref 6–23)
CO2: 27 mEq/L (ref 19–32)
CO2: 29 mEq/L (ref 19–32)
Calcium: 9.1 mg/dL (ref 8.4–10.5)
Chloride: 92 mEq/L — ABNORMAL LOW (ref 96–112)
GFR calc Af Amer: >60 mL/min (ref >60)
GFR calc non Af Amer: >60 mL/min (ref >60)
GFR calc non Af Amer: >60 mL/min (ref >60)
Glucose, Bld: 160 mg/dL — ABNORMAL HIGH (ref 70–99)
Glucose, Bld: 167 mg/dL — ABNORMAL HIGH (ref 70–99)
Sodium: 129 mEq/L — ABNORMAL LOW (ref 135–145)
Total Bilirubin: 0.8 mg/dL (ref 0.3–1.2)
Total Protein: 6.3 g/dL (ref 6.0–8.3)

## 2010-05-16 LAB — MAGNESIUM: Magnesium: 1.8 mg/dL (ref 1.5–2.5)

## 2010-05-16 LAB — URINALYSIS, ROUTINE W REFLEX MICROSCOPIC
Bilirubin Urine: NEGATIVE
Glucose, UA: NEGATIVE mg/dL
Hgb urine dipstick: NEGATIVE
Ketones, ur: NEGATIVE mg/dL
Protein, ur: NEGATIVE mg/dL

## 2010-05-16 LAB — CROSSMATCH

## 2010-05-16 LAB — CBC
HCT: 27.4 % — ABNORMAL LOW (ref 39.0–52.0)
Hemoglobin: 9 g/dL — ABNORMAL LOW (ref 13.0–17.0)
Hemoglobin: 9.2 g/dL — ABNORMAL LOW (ref 13.0–17.0)
MCHC: 32.5 g/dL (ref 30.0–36.0)
MCHC: 32.8 g/dL (ref 30.0–36.0)
MCHC: 33.2 g/dL (ref 30.0–36.0)
MCV: 71 fL — ABNORMAL LOW (ref 78.0–100.0)
MCV: 72.7 fL — ABNORMAL LOW (ref 78.0–100.0)
Platelets: 206 10*3/uL (ref 150–400)
RBC: 3.86 MIL/uL — ABNORMAL LOW (ref 4.22–5.81)
RBC: 3.95 MIL/uL — ABNORMAL LOW (ref 4.22–5.81)
RDW: 16.9 % — ABNORMAL HIGH (ref 11.5–15.5)
RDW: 17.5 % — ABNORMAL HIGH (ref 11.5–15.5)
WBC: 7.9 10*3/uL (ref 4.0–10.5)
WBC: 8 10*3/uL (ref 4.0–10.5)

## 2010-05-16 LAB — CARDIAC PANEL(CRET KIN+CKTOT+MB+TROPI)
CK, MB: 13.8 ng/mL — ABNORMAL HIGH (ref 0.3–4.0)
CK, MB: 15.4 ng/mL — ABNORMAL HIGH (ref 0.3–4.0)
Total CK: 366 U/L — ABNORMAL HIGH (ref 7–232)
Total CK: 378 U/L — ABNORMAL HIGH (ref 7–232)
Troponin I: 0.03 ng/mL (ref 0.00–0.06)

## 2010-05-16 LAB — BASIC METABOLIC PANEL
Chloride: 96 mEq/L (ref 96–112)
Creatinine, Ser: 0.95 mg/dL (ref 0.4–1.5)
GFR calc Af Amer: >60 mL/min (ref >60)
Potassium: 3.2 mEq/L — ABNORMAL LOW (ref 3.5–5.1)
Sodium: 131 mEq/L — ABNORMAL LOW (ref 135–145)

## 2010-05-16 LAB — CK TOTAL AND CKMB (NOT AT ARMC)
CK, MB: 11.7 ng/mL — ABNORMAL HIGH (ref 0.3–4.0)
Relative Index: 3.7 — ABNORMAL HIGH (ref 0.0–2.5)

## 2010-05-16 LAB — ABO/RH: ABO/RH(D): A NEG

## 2010-05-16 LAB — DIFFERENTIAL
Basophils Relative: 0 % (ref 0–1)
Eosinophils Absolute: 0.1 10*3/uL (ref 0.0–0.7)
Lymphs Abs: 0.9 10*3/uL (ref 0.7–4.0)
Monocytes Relative: 7 % (ref 3–12)
Neutro Abs: 5.8 10*3/uL (ref 1.7–7.7)
Neutrophils Relative %: 80 % — ABNORMAL HIGH (ref 43–77)

## 2010-05-16 LAB — APTT: aPTT: 26 seconds (ref 24–37)

## 2010-05-16 LAB — PROTIME-INR
INR: 1 (ref 0.00–1.49)
Prothrombin Time: 13.5 seconds (ref 11.6–15.2)

## 2010-05-16 LAB — CORTISOL-AM, BLOOD: Cortisol - AM: 6.6 ug/dL (ref 4.3–22.4)

## 2010-06-22 NOTE — Assessment & Plan Note (Signed)
[name], [name]                     MRN:          [id number]  DATE:08/10/2006                            DOB:          1945/12/12    CHIEF COMPLAINT:  Change in bowel habits, also reflux and cough,  referred by Dr. Delton Coombes.   ASSESSMENT:  [name] has constipation, straining the stool, and  smaller caliber bowel movements. He may have had this problem in the  past. His last colonoscopy was in 2004 by Dr. Corinda Gubler. It showed  external hemorrhoids and otherwise was unremarkable. He had some mild  reflux esophagitis changes and hiatal hernia on an EGD at that time as  well.   His other problem has been a chronic cough for many years, which is  apparently getting better on Zegerid. He still has some problems. He  does not have any particular heartburn at this time.   Please see my medical history and physical form for further details for  the assessment, history, etcetera.   PLAN:  1. I recommended a colonoscopy, but he declined. We will try fiber      supplements.  2. I have checked a TSH that is normal.  3. He will return to see me in one month. He understands that he could      have colon cancer causing this problem, but does not wish to      perform a colonoscopy at this time.  4. Regarding reflux and cough, I think that he should continue the      Zegerid and I will reassess that when he returns.   PROBLEMS:  Note that he thinks a lot of his symptoms of his bowel habits  occurred after his open heart surgery. His other problems include aortic  valve and partial aortic graft by Dr. Cornelius Moras. Original problem aortic  stenosis.  1. Hypertension.  2. Dyslipidemia.  3. Irritable bowel syndrome.  4. Hiatal hernia.  5. Benign prostatic hypertrophy.  6. Hemorrhoids.  7. Osteoarthritis.  8. Prior nasal septoplasty.  9. Prior hernia repair.  10.Right knee arthroscopy.  11.Hernia repair was bilateral inguinal many years ago.  12.Bunionectomy.  13.He is currently having problems with what sounds like cough      syncope.   MEDICATIONS:  Are listed and reviewed on the chart.   I appreciate the opportunity to care for this patient.     Iva Boop, MD,FACG  Electronically Signed    CEG/MedQ  DD: 08/10/2006  DT: 08/11/2006  Job #: [id number]   cc:   Donia Guiles, M.D.  Leslye Peer, MD

## 2010-06-22 NOTE — Consult Note (Signed)
[name], [name]              ACCOUNT NO.:  [id number]   MEDICAL RECORD NO.:  [id number]          PATIENT TYPE:  OBV   LOCATION:  2040                         FACILITY:  [facility]   PHYSICIAN:  Michiel Cowboy, MDDATE OF BIRTH:  1945-08-23   DATE OF CONSULTATION:  DATE OF DISCHARGE:                                 CONSULTATION   PRIMARY CARE PHYSICIAN:  Donia Guiles, M.D.   REQUESTING PHYSICIAN:  Rollene Rotunda, MD, [facility].   REASON FOR CONSULTATION:  Medical management of headache, urinary tract  infection, and hyponatremia.   Patient is a 65 year old gentleman whose past medical history is  significant for aortic valve replacement with porcine valve secondary to  aortic stenosis, which was done in 2006.  Patient was in regular state  of health up until about a week ago or so when he presented initially to  Dr. Clovis Riley and then subsequently to Dr. Arvilla Market with complaints of  burning with urination.  The patient was prescribed ciprofloxacin and  Pyridium.  When the patient tried to take his dose of Pyridium, he  developed diarrhea and then nausea and vomiting, which he attributed to  Pyridium.  Thereafter, the patient proceeded to still have dry heaves,  even though he discontinued his Pyridium.  On the day of admission, the  patient developed chest pain and pressure as well as numbness in the  left hand and presented to the emergency department.  Other than that,  the patient has also been complaining over the past 3-4 days of  headache, which he describes as a heavy sensation on his head as well as  pressure and congestion in his sinuses.  Otherwise, he denies any  fevers, chills.  He reports a cough, which is chronic.  Chest pain, as  above.  He reports dysuria, as described.  Overall weakness.  Denies any  bright red blood per rectum or melena.  At this time of evaluation, the  patient's chest pain, nausea and vomiting have completely resolved, but  he still has some  headache.   PAST MEDICAL HISTORY:  1. Significant for aortic valve replacement with porcine tissue valve      secondary to aortic stenosis.  2. Hypertension.  3. Hyperlipidemia.  4. Irritable bowel syndrome.  5. Hiatal hernia.  6. BPH.  7. Nephritis.  8. Sleep apnea.   ALLERGIES:  CODEINE, AVODART.   MEDICATIONS:  1. Amlodipine 5 mg p.o. daily.  2. Hyzaar 100/25 mg p.o. daily.  3. Toprol 100 mg p.o. daily.  4. Lescol 80 mg p.o. daily.  5. Zegerid 40 mg b.i.d.  6. ProAire inhaler 90 mg 2 puffs q.4h. as needed.  7. Nasacort 2 sprays per nostril a day.  8. Vicodin as needed for pain.  9. Potassium 20 mEq p.o. daily.  10.Loratadine 10 mg p.o. daily.  11.Nystatin cream.  12.Cipro 500 mg b.i.d. x10 days, recently prescribed.  13.Pyridium, recently prescribed.  14.Aspirin 81 mg p.o. daily.  15.Diazepam.  Patient is unsure of the dose but states this recently      has been prescribed for insomnia.  He is not currently  taking.   REVIEW OF SYSTEMS:  As per HPI.   SOCIAL HISTORY:  Patient is married.  Used to smoke for three years,  then quit about 35 years ago.  Patient used to drink but quit 18 years  ago.   FAMILY HISTORY:  Noncontributory.   PHYSICAL EXAMINATION:  VITALS:  Temperature 97.7, pulse 82, respirations  22, blood pressure 163/103.  Satting 96% on room air.  GENERAL:  No acute distress.  Dry mucous membranes.  Somewhat decreased  turgor.  LUNGS:  Clear to auscultation bilaterally but decreased breath sounds  bilaterally.  NECK:  No JVD can be appreciated,  but neck is obese.  HEART:  A 2/6 systolic murmur noted.  ABDOMEN:  Soft, nontender, nondistended.  GU:  No ulcers noted on the genitalia or the urethral orifices.  No  discharge noted.  NEUROLOGIC:  No acute abnormalities.  Nonfocal.   STUDIES:  CT scan showed old frontal ischemia but no acute changes.   Chest x-ray showed decreased inspiration bilaterally.  Poor air volume.   EKG:  Heart rate 57,  prolonged QTC.   LABS:  White blood cell count 11.8, hemoglobin 11.4, sodium 121,  potassium 3.5, bicarb 26, creatinine 0.9.  MB 5.6.  Troponin less than  0.05.  Urine shows positive nitrites but no white blood cells.   ASSESSMENT/PLAN:  This is a 65 year old gentleman admitted by cardiology  for observation with chest pain.  1. Hyponatremia, likely secondary to dehydration.  Gives a recent      history of nausea and vomiting.  Will rehydrate.  Check      orthostatics.  Check urine lytes.  Hold hydrochlorothiazide.  At      this point, no acute neurological changes.  Will continue to      monitor closely and recheck sodium every 6 hours to avoid over-      correction.  2. Urinary tract infection:  UA positive for nitrites.  Urine culture      pending.  Would not treat with ciprofloxacin, given the QTC      prolongation.  Instead try Macrobid 100 mg p.o. b.i.d.  Will adjust      as needed pending the studies.  3. Hypertension:  Will continue home meds except for      hydrochlorothiazide, which I will hold.  4. History of aortic stenosis, as per cardiology.  5. Chest pain, as per cardiology.  6. History of alcohol abuse:  Currently stable.  Has not drinking for      the past 18 years.  Liver function tests within normal limits.  7. Headache:  Could be possibly secondary to dehydration versus sinus      headache.  Continue home Nasacort.  Agree with Vicodin as needed      p.r.n. pain.   Thank you for this interesting consult.  Will continue to follow.      Michiel Cowboy, MD  Electronically Signed     AVD/MEDQ  D:  03/07/2007  T:  03/07/2007  Job:  [id number]

## 2010-06-22 NOTE — Assessment & Plan Note (Signed)
[facility]                         GASTROENTEROLOGY OFFICE NOTE   NAME:PUCKETTHilton, Saephan                     MRN:          [id number]  DATE:10/18/2006                            DOB:          Jun 13, 1945    PROBLEM LIST:  See previously dictated list.   Mr. Kunz returns.  His cough is doing pretty well.  He still has  spells of reflux, however.  He still remains constipated, but no  bleeding.  He says he feels like he needs a clean out.  Dr. Delton Coombes  performed a bronchoscopy.  He found collapse of the right-sided airways  which etiology was not clear.  He is using FiberCon with 1-2 bowel  movements a week.  He may or may not have tried MiraLax.  He has a great  deal of trouble with liquids and things like Metamucil, but I have  explained to him that MiraLax is not quite like that.   OBJECTIVE:  VITAL SIGNS:  Weight 217, pulse 64, blood pressure 123/80.   ASSESSMENT:  1. Reflux disease with cough, airway and breathing problems.  Etiology      not clear.  Dr. Delton Coombes has questioned whether or not his collapse is      related to a connective tissue disease.  I suppose reflux and      aspiration or other reflux mediated lung disease could be possible      as well.  He still has some breakthrough heartburn.  Maybe, he is      not on adequate control.  2. Constipation persists.   PLAN:  1. Will discuss with Dr. Delton Coombes.  I think EGD and pH probe testing      could be indicated.  2. I still think he needs a colonoscopy.  I suspect that it is not      anything serious.  He had a colonoscopy in 2004, but this is      necessary.  I am holding off because of the other workup that is      needed, particularly with the airway.  We will come up with a plan      once I discuss with Dr. Delton Coombes.  3. He is to try magnesium citrate and then MiraLax once or twice a day      after the magnesium citrate, plus or minus Dulcolax as used to try      and get him started.   Further plans pending that.  4. Modified barium swallow was normal.     Iva Boop, MD,FACG  Electronically Signed    CEG/MedQ  DD: 10/18/2006  DT: 10/19/2006  Job #: [id number]   cc:   Leslye Peer, MD  Donia Guiles, M.D.  Madolyn Frieze Jens Som, MD, [facility]

## 2010-06-22 NOTE — Assessment & Plan Note (Signed)
[facility]                             PULMONARY OFFICE NOTE   NAME:[name], [name]                     MRN:          [id number]  DATE:06/20/2006                            DOB:          [birth year]    SUBJECTIVE:  [name] is a 65 year old man with a history of  hypertension, aortic valve replacement, and hyperlipidemia.  I have been  following him for unrelenting cough.  I initially believed that the  driving force behind his cough was uncontrolled GERD.  He also has  airflow limitation with a bronchodilator responsiveness.  For these  reasons, I have started him on Zegerid twice a day, and also we  initiated a trail of Symbicort 160/4.5 mcg 2 puffs b.i.d.  Since our  last visit, he tells me that his GERD symptoms may be somewhat improved,  but that his cough is no better.  He continues to have dry cough.  Sometimes he has paroxysms of cough that can lead to presyncope.  On at  least 1 occasion he has had a syncopal episode.  He denies any  significant postnasal drip, although he does occasionally have a tickle  in the back of his throat.  He also has had some itchy eyes and runny  eyes.  His wife wonders whether this has been worse since the pollen  season over the last 2-3 weeks.  Of note, he also endures some  aspiration-type symptoms with worsening cough when he eats or drinks.  He has noticed it occasionally when he is using a cough drop or candy  that some of the medication goes down the wrong way.   CURRENT MEDICATIONS:  1. Hyzaar 100/25 mg once daily.  2. Metoprolol 100 mg daily.  3. Lescol XL 80 mg daily.  4. Aspirin 81 mg daily.  5. Alleve p.r.n.  6. Omega-3 fatty acids 1200 mg daily.  7. Zegerid 40/1100 mg b.i.d.  8. Symbicort 160/4.5 mcg 2 puffs b.i.d.  9. ProAir 2 puffs every 4 hours p.r.n. for shortness of breath.   PHYSICAL EXAMINATION:  IN GENERAL:  This is a pleasant, obese gentleman  who is in no distress on room air.   He is coughing frequently throughout  the exam.  His weight is 217 pounds, temperature 98.1, blood pressure 132/88, heart  rate 69, SPO2 98% on room air.  HEENT EXAM:  He has some dysarthria and some mild posterior pharyngeal  erythema.  His posterior pharynx is crowded.  His lungs are distant but clear.  HEART:  Regular rate and rhythm without murmur.  ABDOMEN:  Obese, soft, nontender with positive bowel sounds.  EXTREMITIES:  No cyanosis, clubbing, or edema.   IMPRESSION:  Chronic cough.  The contributors to this likely include  gastroesophageal reflux disease, which appears to be better controlled,  mild airflow limitation superimposed on some mild restrictive disease,  and, finally, some suspected allergic rhinitis.  I am also concerned  that he may have some oropharyngeal dysphagia, given his dysarthria and  also his complaints of coughing with eating and drinking.   PLAN:  1. I will  discontinue his Symbicort as I believe this may be      exacerbating his posterior oropharyngeal irritation.  2. Start Claritin once daily.  3. Nasacort 2 sprays each nostril daily.  4. Continue Zegerid 40/1100 mg b.i.d.  5. I will obtain a swallowing evaluation and a modified barium swallow      to insure that he does not have occult aspiration contributing to      his cough.  6. I will follow up with [name] in 4-6 weeks to review the      results of the above and to assess his progress on this regimen.     Leslye Peer, MD  Electronically Signed    RSB/MedQ  DD: 06/20/2006  DT: 06/20/2006  Job #: [id number]   cc:   Madolyn Frieze. Jens Som, MD, [facility]  Donia Guiles, M.D.

## 2010-06-22 NOTE — Discharge Summary (Signed)
[name], [name]              ACCOUNT NO.:  [id number]   MEDICAL RECORD NO.:  [id number]          PATIENT TYPE:  OBV   LOCATION:  2040                         FACILITY:  [facility]   PHYSICIAN:  Madolyn Frieze. Jens Som, MD, FACCDATE OF BIRTH:  1945-03-11   DATE OF ADMISSION:  03/07/2007  DATE OF DISCHARGE:  03/09/2007                               DISCHARGE SUMMARY   CARDIOLOGIST:  Madolyn Frieze. Jens Som, MD, [facility]   PRIMARY CARE PHYSICIAN:  Donia Guiles, M.D.   PULMONARY:  Leslye Peer, M.D.   UROLOGYLynelle Smoke I. Patsi Sears, M.D.   DISCHARGE DIAGNOSES:  1. Chest pain.  Negative cardiac workup this admission.  No further      cardiac workup planned.  2. Status post aortic valve replacement in 2006 secondary to aortic      stenosis.  The patient has a porcine valve.  3. Hyponatremia during this hospitalization.  4. Mild elevation in CK.  5. Hypertension.  6. Dysuria, previously treated with Cipro and Pyridium.  Pyridium      stopped secondary to extreme nausea and vomiting.  The patient      currently being treated with Macrodantin.   PAST MEDICAL HISTORY:  Hyperlipidemia, irritable bowel syndrome, hiatal  hernia, BPH, arthritis, sleep apnea with an NPS study in November 2008;  however, the patient does not wear CPAP.  Status post cardiac  catheterization showing no coronary artery disease.  Last echocardiogram  November 2008 showed EF 55% to 65% without wall motion abnormalities.  Mild LVH, mild aortic root dilatation.  Bioprosthetic aortic valve.  Recent urinary tract infection, from which the patient continues to have  symptoms.  Prolonged QTc.   HOSPITAL COURSE:  [name] is a 65 year old Caucasian gentleman who  presented to [facility] emergency room complaining of chest discomfort  without associated symptoms.  The patient was complaining, however, of  diarrhea and nausea, dry heaves after taking his first dose of Pyridium.  The patient had previously been prescribed  Cipro and Pyridium by primary  care doctor for a urinary tract infection.  In the emergency room, he  received sublingual nitroglycerin, morphine and Zofran.  Te patient was  admitted for observation.  Initial point-of-care markers and EKGs  unremarkable.  It was not felt to be cardiac in origin.  The patient  also found to be hyponatremic with a sodium of 121 and  hypokalemic with  potassium of 3.5.  Medicine was asked to consult and saw the patient on  January 28 for dysuria, recent UTI.  Recommended avoiding Cipro with  prolonged QTc, would try Macrodantin, which was initiated.  The patient  tolerated without side effects.  Cardiac enzymes:  CK elevated at 426,  517 and 426.  Troponins negative x3 sets.  Telemetry normal sinus  rhythm.  EKG without acute ST/T wave changes.  The patient continued to  complain of dysuria, was given IV __________  1 dose and then switched  to tramadol with improvement in symptoms.  Dr. Jens Som in to see the  patient on March 09, 2007.  The patient afebrile, blood pressure  122/76, sat  94% on room air.  H&H 9.8 and  29.6, which was a change.  The patient's admitting H&H was 11.4 and 34.5.  MCV 206, platelets 206.  WBC 7.2, sodium 132, potassium 3.9, BUN and creatinine 7 and 0.8.  Patient being discharged home.  Negative cardiac workup at this time.  No further cardiac workup planned.  Changes made in medication secondary  to hyponatremia.  Hyzaar has been stopped.  The patient is being  switched to Cozaar.  Will need a BMET, CBC in 1 week.  This can be done  at his primary care physician's office.  The patient to follow up with  Dr. Patsi Sears for re-evaluation of his UTI, ongoing dysuria, and follow  up with Dr. Leone Payor for further evaluation of his anemia.   At time of discharge, the patient's medications include:  1. Cozaar 100 mg daily.  2. Toprol XL 100 mg daily.  3. Flonase spray as previously.  4. Claritin as needed.  5. Aspirin 81 mg daily.   6. Macrodantin 50 mg q.i.d. x7 days.  7. Tramadol 25 mg p.o. b.i.d. p.r.n.   The patient can continue his Zegerid, ProAir inhaler, Nasacort,  hydrocodone, Niaspan as previously.   He is instructed to stop his Norvasc, Hyzaar, Lescol, Klor-Con and  Cipro.  He has been given prescriptions for the Cozaar, Macrodantin and  tramadol.  So he needs a CBC and BMET in 1 week at Dr. Roselie Skinner  office.  Prescription order has been given to the patient.  He needs  followup with Dr. Patsi Sears.  The patient to call for appointment.  Follow up with Dr. Leone Payor for anemia.  The patient to call for  appointment.   DURATION OF DISCHARGE ENCOUNTER:  Greater than 30 minutes.      Dorian Pod, ACNP      Madolyn Frieze. Jens Som, MD, [facility]  Electronically Signed    MB/MEDQ  D:  03/09/2007  T:  03/09/2007  Job:  [id number]   cc:   Donia Guiles, M.D.  Sigmund I. Patsi Sears, M.D.  Leslye Peer, MD  Iva Boop, MD,FACG

## 2010-06-22 NOTE — Assessment & Plan Note (Signed)
[facility]                             PULMONARY OFFICE NOTE   NAME:[name], [name]                     MRN:          [id number]  DATE:08/01/2006                            DOB:          24-Jun-1945    HISTORY:  [name] is a 65 year old gentleman with history of  hypertension, AVR, and hyperlipidemia.  He also has chronic, unrelenting  cough in the setting of asthma and bronchodilator responsiveness.  At  our last visit, I asked him to stop his Symbicort as I was concerned  that it was exacerbating his cough.  I started him on Claritin and  Nasacort as well as Zegerid b.i.d.  He has also had a modified barium  swallow as detailed below.  He tells me that his cough is a bit better  and his GERD symptoms are certainly better since he began the Zegerid.  He has also been taking the Claritin regularly, although he does not use  the Nasacort every day.  He has noticed some increased wheezing and some  shortness of breath with exertion since our last visit despite his  improvement in cough.   CURRENT MEDICATIONS:  1. Hyzaar 100/25 mg daily.  2. Metoprolol 100 mg daily.  3. Lescol 80 mg daily.  4. Aspirin 81 mg daily.  5. Zegerid 40 mg b.i.d.  6. Sleep aid p.r.n.  7. ProAir 2 puffs q.4 h.  8. Alleve p.r.n.   PHYSICAL EXAMINATION:  IN GENERAL:  This is a pleasant, well-appearing  man in no distress.  His weight is 218 pounds, temperature 98.2, blood pressure 120/86, heart  rate 73.  SPO2 95% on room air.  HEENT EXAM:  He speaks with a slight lisp.  His oropharynx is clear.  His neck is without stridor.  LUNGS:  Clear to auscultation bilaterally.  HEART:  Regular rate and rhythm without murmur.  ABDOMEN:  Obese but benign.  EXTREMITIES:  No cyanosis, clubbing, or edema.   A modified barium swallow has shown a normal functional swallow without  any penetration or evidence of aspiration.   IMPRESSION:  1. Chronic cough.  2. Airflow limitation  with bronchodilator responsiveness which appears      to have worsened in the absence of his standing bronchodilator      regimen.   PLAN:  1. I will ask him to continue his Zegerid b.i.d.  2. He will continue Claritin.  3. I will restart his Symbicort 160/4.5 mcg 2 puffs b.i.d., and he      will use a spacer to, hopefully, optimize delivery and to avoid      upper airway irritation.  4. [name] will follow up with me in 3-4 months or sooner should      he have any difficulty in the interim.     Leslye Peer, MD  Electronically Signed    RSB/MedQ  DD: 08/01/2006  DT: 08/01/2006  Job #: [id number]   cc:   Madolyn Frieze. Jens Som, MD, [facility]  Donia Guiles, M.D.

## 2010-06-22 NOTE — H&P (Signed)
[name], [name] NO.:  [id number]   MEDICAL RECORD NO.:  [id number]          PATIENT TYPE:  OBV   LOCATION:  1825                         FACILITY:  [facility]   PHYSICIAN:  Danny Rotunda, MD, FACCDATE OF BIRTH:  Oct 27, 1945   DATE OF ADMISSION:  03/07/2007  DATE OF DISCHARGE:                              HISTORY & PHYSICAL   SUMMARY OF HISTORY:  [name] is a 65 year old white male who  presents to [facility] emergency room complaining of chest discomfort.  He states that the onset was around 11 p.m. when he developed chest  tightness that persisted throughout the night.  It seemed to wax and  wane between an 8 and a 9; however, he was able to go to sleep.  When he  woke up this morning at approximately 4:30 a.m., he noted he continued  to have the discomfort.  He denied radiation, although his left arm was  slightly numb.  He denied diaphoresis, changes in his usual shortness of  breath, nausea or vomiting.  He did not try anything specific to  alleviate the symptoms and he cannot recall any aggravating factors.  It  is also noted that he has been recently treated by his primary care  doctor for a urinary tract infection.  He has been prescribed Cipro and  Pyridium.  After his first dose of Pyridium, he developed diarrhea.  After his 2nd dose, he developed nausea and dry heaves yesterday.  He  has also complained of a headache that has been consistent throughout  the week.  He denies specific visual changes, although he feels his  floaters are worsening.  He denied any associated nausea or vomiting  with his headache.  In the emergency room, he received sublingual  nitroglycerin, morphine and Zofran.  It is unclear what reduced his  chest discomfort to a 1 on a scale of 0 to 10.  We will admit him for  observation.   PAST MEDICAL HISTORY:  Allergies include low tolerance to CODEINE AND  ALLERGIC REACTION TO AVODART.   MEDICATIONS PRIOR TO ADMISSION:  1.  Norvasc 5 mg daily.  2. Hyzaar 100/25 daily.  3. Metoprolol ER 100 daily.  4. Lescol XL 80 daily.  5. Zegerid 40 mg b.i.d.  6. Pro-Air HFA 90 mcg 2 puffs q. 4 hours p.r.n.  7. Nasacort 2 sprays each nostril daily.  8. Hydrocodone 7.5/325 q. 6 hours.  9. Klor-Con 20 mEq daily.  10.Loratadine 10 mg daily.  11.Niaspan/triamcinolone cream twice a day.  12.Cipro 500 mg b.i.d. for the next 10 days.  13.Pyridium 100 mg t.i.d.  14.Diphenol oxalate/atropine p.r.n.  15.Aspirin 81 daily.  16.Tylenol p.r.n.  17.Aleve p.r.n.  18.Sleep aid p.r.n.   PAST MEDICAL HISTORY:  1. Notable for hypertension.  2. Hyperlipidemia, unknown last check.  3. Irritable bowel syndrome.  4. Hiatal hernia.  5. BPH.  6. Arthritis.  7. Sleep apnea with an NPS study on November, 2008; however, he does      not wear a CPAP.  8. He has also had an aortic valve replacement on October 13, 2004  secondary to aortic stenosis.  This is a porcine valve.      Catheterization prior to replacement did not show any coronary      artery disease.  Last echocardiogram on January 01, 2007 showed an      EF of 55-65% without wall motion abnormalities, mild LVH, mild      aortic root dilatation, bio-prosthetic aortic valve.  He has not      had a stress test since 2001.   SOCIAL HISTORY:  The patient resides in [place] with his wife Dennie Bible.  He was laid off in May, 2007 from [employer].  He denies any tobacco,  alcohol, drug usage.  He actually quit smoking 35 years ago.  He does  not exercise.   FAMILY HISTORY:  His mother is alive at 11 with a history of  hypertension.  Father is decreased at age 67 with myocardial infarction.  He has 3 siblings with hypertension and dyslipidemia.   REVIEW OF SYSTEMS:  In addition to the above is notable for chronic  shortness of breath and dyspnea on exertion, lower extremity edema that,  again, has not changed.  He also states that when he coughs hard he  passes out and this is  being followed by Dr. Delton Coombes.  Possible  claudication, continued dysuria, depression, arthralgias in the knees  and elbows.  All other systems are unremarkable.   PHYSICAL EXAMINATION:  GENERAL:  Well-developed, well-nourished slightly  obese white male who was somewhat anxious, in no acute distress.  VITAL SIGNS:  Temperature 97.7, blood pressure 163/103, pulse 82,  respirations 22, 96% sat on room air.  HEENT:  Grossly unremarkable.  NECK;  Supple without thyromegaly, adenopathy, JVD or carotid bruits.  CHEST:  Symmetrical excursion.  Lung sounds clear to auscultation.  HEART:  PMI is not displaced.  Regular rate and rhythm, normal S1, S2.  He does have a 2/6 systolic murmur best appreciated at the left sternal  border.  SKIN INTEGUMENT:  Appears to be intact.  ABDOMEN:  Obese.  Bowel sounds present without organomegaly, masses or  tenderness.  EXTREMITIES:  Negative clubbing, cyanosis or edema.  MUSCULOSKELETAL:  Otherwise,, unremarkable.   Chest x-ray did not show any acute finding.  Head CT also did not show any acute intracranial abnormalities; however,  without contrast, it did show remote ischemia to the right white matter.  EKG in the emergency room shows normal sinus rhythm, left axis  deviation, baseline artifact, early R-wave, non-specific ST T-wave  changes, possible prolonged QTC at 485.  H&H is 11.4, 34.5, platelets 293, WBCs slightly elevated at 11.8.  Sodium was decreased at 121, potassium 3.5, BUN 5, creatinine 0.9.  This  is an i-STAT.  Point of care marker in the ER was negative x1.   IMPRESSION:  1. Prolonged atypical chest discomfort without evidence of coronary      artery disease by catheterization in 2006.  Initial point of care      marker and EKGs are unremarkable.  Doubt cardiac etiology.  2. Prolonged headache without acute findings on CT but some      abnormalities as described above.  3. Hypertension.  4. Urinary tract infection, for which the  patient continues to have      symptoms.  5. Hyponatremia.  Sodium on January 29, 2007 at [facility] was 132      and on December 19, 2005 136.  6. Prolonged QTC.  History as noted per past medical history.  DISPOSITION:  Dr. Antoine Poche reviewed the patient's history, spoke with  and examined the patient.  We will admit him for observation, rule out  myocardial infarction; however, do not feel that any of his symptoms are  cardiac in nature.  I have contacted [facility] who will see the  patient in consultation and address his multiple medical issues.  If he  needs to remain more than 24 hours for observation from a medical  standpoint, we will transfer the patient to the [facility] Hospitalist's  practice.  Further recommendations will be based on findings.      Joellyn Rued, PA-C      Danny Rotunda, MD, [facility]  Electronically Signed    EW/MEDQ  D:  03/07/2007  T:  03/07/2007  Job:  [id number]   cc:   Donia Guiles, M.D.  Madolyn Frieze Jens Som, MD, [facility]  Leslye Peer, MD  [facility] Dr. Cornelius Moras

## 2010-06-22 NOTE — Discharge Summary (Signed)
[name], [name]              ACCOUNT NO.:  [id number]   MEDICAL RECORD NO.:  [id number]          PATIENT TYPE:  INP   LOCATION:  2023                         FACILITY:  [facility]   PHYSICIAN:  Corinna L. Lendell Caprice, MDDATE OF BIRTH:  [birth year]   DATE OF ADMISSION:  09/19/2008  DATE OF DISCHARGE:                               DISCHARGE SUMMARY   DISCHARGE DIAGNOSES:  1. Presyncope secondary to anemia and relative hypotension.  2. Heme-positive stool with probable rectal bleed, resolved.  3. Hypertension.  4. Irritable bowel syndrome.  5. Chronic hyponatremia.  6. Hypokalemia.  7. Acute blood loss anemia.  8. Obstructive sleep apnea, on CPAP.  9. Chronic cough and bronchomalacia.  10.History of recurrent syncope.  11.Chronic constipation and hemorrhoids.  12.Dyslipidemia.  13.Benign prostatic hypertrophy.  14.Right flank pain, suspect musculoskeletal.  15.Restless leg syndrome.  16.Chronic microcytic anemia, negative esophagogastroduodenoscopy and      colonoscopy in 2008.  17.Gastroesophageal reflux disease, hiatal hernia.  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  19.Chronic cough.  20.History of benign prostatic hypertrophy.   DISCHARGE MEDICATIONS:  1. Hold aspirin until cleared by physician.  2. Hold hydrochlorothiazide, metoprolol, losartan.  If systolic blood      pressure higher than 140, resume metoprolol first and losartan      second and hydrochlorothiazide last.  3. Continue albuterol MDI 2 puffs q.i.d.  4. Mometasone 2 puffs nightly.  5. Ketoconazole cream twice a day to affected area.  6. Alprazolam 0.25 mg nightly as needed.  7. Ropinirole 3 mg p.o. b.i.d.  8. Carbamide peroxide ear wash both ears twice a day.  9. Amitriptyline 25 mg nightly as needed.  10.Lomotil 2 tablets twice a day as needed for diarrhea.  11.Finasteride 5 mg a day.  12.Loratadine 10 mg a day.  13.Cyclobenzaprine 10 mg t.i.d.  14.Zegerid 20 mg  b.i.d.  15.Fish oil capsules two daily.  16.Cysta-Q twice a day.  17.Ibuprofen as needed for pain.  18.Tylenol as needed for pain.  19.Pataday eyedrops 1 drop both eyes daily.  20.Systane twice a day to both eyes.  21.GenTeal gel eyedrops both eyes twice a day.   CONDITION:  Stable.   ACTIVITY:  Ad lib.   FOLLOWUP:  Follow up in 1-2 weeks with primary care physician to check  blood pressure, basic metabolic panel, and hemoglobin/hematocrit.  Resume aspirin as appropriate.  Follow up with Dr. Leone Payor,  gastroenterologist, for capsule endoscopy as recommended by GI.  Follow  up with urologist if scrotal bleeding returns.   CONSULTATIONS:  Rachael Fee, MD   PROCEDURES:  None.   DIET:  As tolerated.   LABORATORY DATA:  CBC on admission significant for hemoglobin of 9.  After 1 unit of packed red blood cells, his hemoglobin increased to 9.7.  Sodium on admission 129, at discharge 131; potassium 3.7 on admission,  at discharge 3.2 and is being repleted; glucose on admission 160, but  this normalized.  BUN and creatinine normal.  Liver function tests  significant for an SGOT of 42, otherwise unremarkable.  Magnesium 1.8.  Serial cardiac enzymes showed normal troponins.  Total CPK and MB was  slightly elevated.  TSH 1.020.  Cortisol 6.6.  Urinalysis negative  blood, negative nitrite, negative leukocyte esterase.  Hemoccult of the  stool was positive.   SPECIAL STUDIES:  Radiology, two views of the chest showed stable  chronic basilar atelectasis.  EKG showed normal sinus rhythm.   HISTORY AND HOSPITAL COURSE:  [name] is a 65 year old white male  with multiple medical problems with a history of frequent syncope  related to chronic cough and tracheomalacia.  He presented with several  episodes of near syncope.  Please see H and P for details.  He has a  history of chronic microcytic anemia and has had upper and lower  endoscopy by Dr. Leone Payor in 2008.  He had tiny polyp  noted, mild  diverticulosis, internal hemorrhoids, irritable bowel response to scope.  He reported that he had had what he felt was profuse scrotal bleeding.  He had evidence of hemorrhoids on exam and had heme-positive brown  stool.  There were no bleeding areas on his scrotum.  There was a few  small petechial areas that were not bleeding.  His bleeding is most  likely rectal in origin, possibly hemorrhoidal.  He had a blood pressure  initially that was within normal limits, but it subsequently dropped to  90 systolic.  He reports that his blood pressure usually runs about 120  systolic and he is on multiple antihypertensives.  I suspect his  symptoms were related to both the acute on chronic anemia in the setting  of relative transient hypotension.  Nevertheless, GI was consulted and  felt that no endoscopy was needed at this time unless he continued to  bleed.  His bleeding stopped and his blood pressure normalized off  antihypertensives and on IV fluids.  They did recommend capsule  endoscopy as had been reportedly recommended previously, but the patient  had declined.   The patient had hyponatremia on admission, which upon further chart  reviews is chronic.  He also developed hypokalemia, which has been  repleted.  Total time on the day of discharge is 45 minutes.      Corinna L. Lendell Caprice, MD  Electronically Signed     CLS/MEDQ  D:  09/20/2008  T:  09/20/2008  Job:  [id number]   cc:   Donia Guiles, M.D.  Madolyn Frieze Jens Som, MD, [facility]  Iva Boop, MD,FACG  Leslye Peer, MD

## 2010-06-22 NOTE — Assessment & Plan Note (Signed)
[facility]                         GASTROENTEROLOGY OFFICE NOTE   NAME:[name], [name]                     MRN:          [id number]  DATE:04/17/2007                            DOB:          02-03-1946    CHIEF COMPLAINT:  Followup for abdominal problems.   HISTORY:  [name] has a sore belly most days.  On the weekends it  seems to be worse.  He has had some diarrhea recently, but that is not a  predominant feature.  It is essentially the same sort of problems he has  been having over the years.  In February he was diagnosed with a  microcytic anemia, MCV in the 70s, and his hemoglobin 11.4.  This is new  since at least 2007 when he had a normal hemoglobin and normal MCV.  He  is not noting any melena or rectal bleeding.  He does not eat red meat.  Lomotil helps his diarrhea.  He is stressed overall.  He is trying to  find work.  He is laid off.  His wife does have a job, and they are  making ends meet so there is no pending disaster, but times are tough.  He takes his iron only 3 times a day because it has made his stools  dark, and it sounds like it may have upset his stomach but it is not  entirely clear.  He has finished some sulfamethoxazole with trimethoprim  for prostatitis.  He is pending a cataract operation later this month.  His medications are listed and reviewed in the chart.   His allergies are CODEINE and AVODART, sensitivities or allergies.   REVIEW OF SYSTEMS:  As above, he has dyspnea on exertion which is  stable.  He feels somewhat anxious but not overtly so.   PAST MEDICAL HISTORY:  Reviewed and unchanged from my notes of October 18, 2006, September 11, 2006, and August 10, 2006.   EGD December 05, 2006, small hiatal hernia.  He had a Bravo pH probe with  mild acid reflux on Zegerid without good link to symptoms.  Colonoscopy  December 05, 2006, tiny polyp removed in the ascending colon, but not  recovered.  Mild sigmoid  diverticulosis and internal hemorrhoids and an  irritable bowel response to the scope.  He was recommended to take  MiraLax for constipation at that time.  He is not using that regularly  right now, but he is generally moving his bowels okay, I think.   PHYSICAL EXAMINATION:  Weight 214 pounds, pulse 72, blood pressure  134/88.  EYES:  Anicteric.  Pupils round and reactive to light.  MOUTH:  Posterior pharynx free of lesions.  Mucosa looks pink and  healthy.  The neck is supple without thyromegaly or mass.  The lungs are clear.  HEART:  S1, S2, no murmurs, rubs, or gallops.  ABDOMEN:  Obese, soft, diffusely tender to mild palpation in a sore  pattern.  There are no focal masses or tenderness.  LOWER EXTREMITIES:  Free of edema.  He is alert and oriented x3.  Appropriate mood and affect.  I should note he has a small, blue spot on the right lower lip which has  been there chronically.   I have reviewed the lab findings and results of his procedures with him  again.   ASSESSMENT:  1. Irritable bowel syndrome is the predominant problem.  The iron may      have exacerbated it a little bit.  2. Gastroesophageal reflux disease, does not seem to be a big problem      right now on the Zegerid.  3. Anemia, microcytic, presumably iron deficiency.  He does not eat      beef.  He has had an esophagogastroduodenoscopy and a colonoscopy      without any significant findings with respect to this within the      past 6 to 12 months.   PLAN:  1. Align for irritable bowel syndrome daily for at least a month.      Continue if he thinks it helps and he wants to.  2. He has declined Hemoccults.  I was going to check those, looking      for possible small bowel process, though that seems unlikely.  If      they were positive, we could pursue a capsule endoscopy, though I      suspect his anemia is nutritional.  3. Librax 1 to 2 q.6 h. p.r.n. abdominal pain for his irritable bowel.      I explained  the rationale for treatment with this.  4. He will try Slow FE iron supplement and needs to take that daily,      and continue followup with Dr. Arvilla Market regarding his CBC.   Looking at him overall, depending upon his response to the Librax, it  may be useful to consider an selective serotonin reuptake inhibitor in  this patient.  Will defer to Dr. Arvilla Market, probably.     Iva Boop, MD,FACG  Electronically Signed    CEG/MedQ  DD: 04/17/2007  DT: 04/17/2007  Job #: [id number]   cc:   Donia Guiles, M.D.  Sigmund I. Patsi Sears, M.D.  Leslye Peer, MD

## 2010-06-22 NOTE — Assessment & Plan Note (Signed)
[facility]                             PULMONARY OFFICE NOTE   NAME:[name], [name]                     MRN:          [id number]  DATE:10/20/2006                            DOB:          1945-06-05    SUBJECTIVE:  [name] is a 64 year old man with severe chronic cough.  Influences on his cough have included difficult to control GERD and also  postnasal drip.  I performed a fiberoptic bronchoscopy on September 15, 2006.  This showed severe bronchomalacia which I believe is probably  contributing to his cough and almost certainly contributes to his post  tussive syncope and presyncope.  He continued to have cough.  It is  nonproductive.  It may be slightly better than previously.  He had an  episode of presyncope with cough about 2 weeks ago.  He coughs  especially at night.  He also snores. He has gained about 15 pounds over  the last 2 years.  He does have daytime sleepiness.  He has been seen by  Dr. Leone Payor, in Gastroenterology, with regard to his GERD which has been  hard to manage.  He is continuing to take a proton pump inhibitor and is  planning for a pH probe study to help guide our plans.   CURRENT MEDICATIONS:  Were reviewed and are correct as indicated in his  clinic chart dated October 20, 2006.   EXAM:  GENERAL:  This is a well-appearing obese gentleman in no distress  on room air.  His weight is 219 pounds, temperature 98.1, blood pressure  136/90, heart rate 69, SPO2 95% on room air.  HEENT EXAM:  Oropharynx is clear.  He has some posterior oropharyngeal  erythema.  NECK:  Supple without lymphadenopathy or stridor.  CHEST:  Clear.  HEART:  Regular, without murmur.  ABDOMEN:  Obese, soft, nontender with positive bowel sounds.  EXTREMITIES:  Have no cyanosis, clubbing or edema.   IMPRESSION:  Chronic cough in the setting of profound bronchomalacia  which is likely a result of (as opposed to a cause) his cough, but which  may in  fact be contributing and sustaining his cough at this time.  Certainly, it is probably contributing to his post-tussive syncope.  I  would like to continue him on his therapy for gastroesophageal reflux  disease.  We will write a letter to [facility] so that he  can continue to get his Zegerid.  He is going to have a pH probe study  as arranged by Dr. Leone Payor.  I will arrange for a polysomnogram.  If he  has sleep apnea then positive pressure may actually help not only sleep  apnea but his cough if his redundant airway tissue is contributing to  upper airway irritation.   I will follow up with [name] in 6 weeks.     Leslye Peer, MD  Electronically Signed    RSB/MedQ  DD: 11/13/2006  DT: 11/13/2006  Job #: [id number]

## 2010-06-22 NOTE — Assessment & Plan Note (Signed)
[facility]                            CARDIOLOGY OFFICE NOTE   NAME:Cunnington, RODOLPHE EDMONSTON                     MRN:          [id number]  DATE:07/26/2007                            DOB:          [birth year]    Mr. Dehner is a pleasant gentleman who has a history of aortic valve  replacement with porcine valve.  His most recent echocardiogram was  performed in November 2008.  At that time, his LV function was normal.  There was a bioprosthetic aortic valve, and findings were consistent  with mild aortic stenosis but no aortic insufficiency.  There was mild  aortic root dilatation.  He did have a CTA of his aorta on July 23, 2007.  There was further involution of the postsurgical changes, status  post aortic valve replacement and grafting.  There was no recurrent  aneurysm or dissection.  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  Since I  last saw him, he is doing well.  He has mild dyspnea with more extreme  activities.  There is no orthopnea, PND, pedal edema, palpitations,  presyncope, syncope, or chest pain.  His medications include:  1. Lopressor 50 mg p.o. b.i.d.  2. HCTZ 12.5 mg p.o. b.i.d.  3. Terazosin.  4. Fish oil.  5. Sulfamethoxazole.   He also takes Cozaar 100 mg p.o. daily, Zegerid, ProAir, Nasacort,  loratadine, aspirin 81 mg p.o. daily, and iron.   His physical exam today shows blood pressure of 114/76.  His pulse is  68.  He weighs 211 pounds.  His HEENT is normal.  His neck is supple.  His chest is clear.  Cardiovascular exam reveals regular rate and  rhythm.  There is a 2/6 systolic murmur at the left sternal border.  His  abdominal exam shows no tenderness.  His extremities show no edema.   DIAGNOSIS:  1. 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  He will      continue with subacute  bacterial endocarditis prophylaxis.  Of      note, a recent CTA showed no recurrent aneurysm of his aortic root.  2. History of aortic root replacement - As per #1.  3. History of cough and syncope - He has had no further episodes of      this recently.  4. Hypertension - His blood pressure is adequately controlled on his      present medications.  5. Hyperlipidemia - Dr. Arvilla Market was managing this.  6. History of presumed allergy to AVODART.  7. History of benign prostatic hypertrophy.   I will see him back in 1 year.     Madolyn Frieze Jens Som, MD, [facility]  Electronically Signed    BSC/MedQ  DD: 07/26/2007  DT: 07/27/2007  Job #: [id number]   cc:   Donia Guiles, M.D.

## 2010-06-22 NOTE — Op Note (Signed)
NAMEEMMET, [name]              ACCOUNT NO.:  [id number]   MEDICAL RECORD NO.:  [id number]          PATIENT TYPE:  AMB   LOCATION:  [facility]                         FACILITY:  [facility]   PHYSICIAN:  Sigmund I. Patsi Sears, M.D.DATE OF BIRTH:  1945/04/11   DATE OF PROCEDURE:  11/09/2007  DATE OF DISCHARGE:                               OPERATIVE REPORT   PREOPERATIVE DIAGNOSIS:  Interstitial cystitis.   POSTOPERATIVE DIAGNOSIS:  Interstitial cystitis.   OPERATION:  Cystourethroscopy, hydrodistention of the bladder (650 mL),  cold cup bladder biopsy, cauterization of the biopsy sites, installation  of Pyridium and Marcaine, injection of Marcaine and Kenalog in the  subtrigonal space.   SURGEON:  Sigmund I. Patsi Sears, M.D.   ANESTHESIA:  General, LMA.   PREPARATION:  After appropriate preanesthesia the patient was brought to  the operating room and placed on the operating table in dorsal supine  position where general LMA anesthesia was introduced.  He was then  replaced in the dorsal lithotomy position where the pubis was prepped  with Betadine solution and draped in the usual fashion.   REVIEW OF HISTORY:  [name] is a 65 year old male with a long  history of bladder outlet obstruction, resistant to alpha blockers with  history of hematuria and prostatitis symptoms.  He is now for cysto, [facility]  and bladder biopsy to evaluate for chronic interstitial cystitis.   PROCEDURE IN DETAIL:  Cystourethroscopy was accomplished and shows a  prostate with multiple areas of yellow staining consistent with  hemosiderin staining from prior prostatic bleeding.  The patient's  bladder neck is mildly elevated.  The bladder itself shows no evidence  of trabeculation, there is thinning of the bladder, and the bladder  distends only to 650 mL.  Cold cup bladder biopsies were taken x2 and  each site is cauterized.  Following this Marcaine and Pyridium is  instilled in the bladder and Marcaine and  Kenalog solution was injected  into the periurethral subtrigonal space.  The patient tolerated the  procedure well.  He was given IV Toradol at the end of the case.  He is  awakened, taken to the recovery room in good condition.      Sigmund I. Patsi Sears, M.D.  Electronically Signed     SIT/MEDQ  D:  11/09/2007  T:  11/10/2007  Job:  [id number]

## 2010-06-22 NOTE — Assessment & Plan Note (Signed)
[facility]                         GASTROENTEROLOGY OFFICE NOTE   NAME:[name], [name]                     MRN:          [id number]  DATE:09/11/2006                            DOB:          06-05-1945    PROBLEMS:  See my note of August 10, 2006.   He is here today to follow up on constipation, gastroesophageal reflux  disease, and cough.   Dr. Delton Coombes thinks the cough might not be GERD related.  Despite suspicion  of some of that, he is planning a bronchoscopy for 5 days from now.  The  constipation is a little better.  The patient took Metamucil.  That made  him nauseated, so he is taking one fiber pill a day that seems to be  helping some, though he had trouble moving his bowels all week and took  some stool softener and then started to have some problems.  He had some  cramps with the bowel movement.  He feels better after a good bowel  movement yesterday.  Dr. Arvilla Market told him to wait for his cough workup  to finish.  He is having cough-associated syncope.  His weight is  basically stable.   VITAL SIGNS:  Weight 218 pounds, pulse 84, blood pressure 120/80.   ASSESSMENT:  1. Constipation.  Change in bowel habits.  Stable to slightly improved      on fiber supplementation.  Last colonoscopy in 2004.  2. Cough.  Unclear etiology.  3. Suspect gastroesophageal reflux disease.   PLAN:  1. Continue current medications.  He wonders if Zegerid could      constipate him.  I suppose that is possible, though typically if      anything it would cause diarrhea.  2. Increase Fibercon to 2 a day, and MiraLax if that is not working.      Samples of both given.  3. Return to see me after his cough workup is as complete as it could      be.  He does need a colonoscopy.  I agree with Dr. Arvilla Market it is      not urgent.  I do want him to follow up with me.  He will call.  I      think it does make sense to work through these other problems      first.     Iva Boop, MD,FACG  Electronically Signed    CEG/MedQ  DD: 09/11/2006  DT: 09/11/2006  Job #: [id number]   cc:   Leslye Peer, MD  Donia Guiles, M.D.

## 2010-06-22 NOTE — Assessment & Plan Note (Signed)
[facility]                         GASTROENTEROLOGY OFFICE NOTE   NAME:[name], [name]                     MRN:          [id number]  DATE:12/04/2006                            DOB:          1945/06/08    PROCEDURE:  Bravo 48-hour pH monitoring of the esophagus.   INDICATIONS:  Heartburn, reflux problems, and chronic cough.   FINDINGS:  On day #1, the patient had a DeMeester score of 20.4 with  normals less than 14.72.  The total pH less than 4% was 5.7, which was  mildly abnormal.  On day #2, the patient had fraction of timed pH less  than 0.1%, which is normal.  He had a DeMeester score of 5.5, which is  normal.  The total fraction of timed pH less than 4 was 3.4%.  He had a  total of 102 reflux episodes.  Most of these were in the upright  position and post-prandial.  There was no link to cough.  There was one  link to medication.   ASSESSMENT:  This patient does have mild acid reflux while on Zegerid.  There is not a good link to any symptom complex at this time. This is  somewhat difficult to interpret in that I am not sure if things are  linked to his cough.  He has a known bronchomalacia problem, and I  suspect that is probably what his trouble is overall.   RECOMMENDATIONS/PLANS:  Would continue the Zegerid for the time being.  I think that is helping him control his reflux.  He could go to daily  Zegerid.  He should follow up with Dr. Delton Coombes regarding his cough.  I  cannot think of any other investigations to be performed at this time.  He is to return to see me as planned.  If he has persistent GI  symptomatology, I would be happy to see him back.     Iva Boop, MD,FACG  Electronically Signed    CEG/MedQ  DD: 12/14/2006  DT: 12/15/2006  Job #: [id number]   cc:   Leslye Peer, MD  Donia Guiles, M.D.

## 2010-06-22 NOTE — Assessment & Plan Note (Signed)
[facility]                            CARDIOLOGY OFFICE NOTE   NAME:[name], [name]                     MRN:          [id number]  DATE:01/23/2007                            DOB:          12-17-1945    Mr. Rosiak is a gentleman who has a history of aortic valve replacement  with a porcine tissue valve secondary to aortic stenosis, as well as  aortic root replacement.  When I last saw him, we scheduled him to have  a repeat echocardiogram on January 01, 2007.  His left ventricular  function was normal.  There was a bioprosthetic aortic valve with a mean  gradient of 10 mmHg.  There was mild aortic root dilatation.  There was  mild left atrial enlargement.  Of note, he is also being treated for  reflux, as well as bronchiectasis.  Since I last saw him, he is doing  well.  There is no significant dyspnea on exertion, orthopnea, PND,  pedal edema, palpitations, pre-syncope, syncope, or exertional chest  pain.  He occasionally feels tight in his chest with coughing.  Of note,  he has had some syncope with coughing and laughing in the past as well.  He has not done this recently.   MEDICATIONS AT PRESENT:  1. Hyzaar 100/25 daily.  2. Toprol 100 mg p.o. daily.  3. Lescol 80 mg p.o. daily.  4. Zegerid.  5. ProAir inhaler.  6. Nasacort.  7. Baby aspirin 81 mg p.o. daily.  8. Aleve.  9. Loratadine.  10.Skelaxin.   PHYSICAL EXAM:  Blood pressure is 122/88 and his pulse is 66.  HEENT:  Normal.  NECK:  Supple.  CHEST:  Clear.  CARDIOVASCULAR:  Regular rate and rhythm.  There is a 2/6 systolic  murmur at the left sternal border.  There is no diastolic murmur noted.  ABDOMEN:  Benign.  EXTREMITIES:  No edema.   DIAGNOSES:  1. Status post aortic valve replacement - his recent echocardiogram      showed that he has a well-functioning prosthetic aortic valve.  He      will continue with subacute bacterial endocarditis prophylaxis.  2. History of  aortic root replacement - we will plan to repeat a CTA      in February.  3. History of cough syncope.  4. Hypertension - he has tracked his blood pressure at home and his      diastolic is running in the 90 range.  We will plan to add      felodipine 5 mg p.o. daily for optimal control.  5. Hyperlipidemia - he will continue on his Lescol and Dr. Arvilla Market is      monitoring his lipids and liver.  6. History of presumed allergy to Avodart.  7. History of benign prostatic hypertrophy.   He will see me back in 9 months.     Madolyn Frieze Jens Som, MD, [facility]  Electronically Signed    BSC/MedQ  DD: 01/23/2007  DT: 01/23/2007  Job #: [id number]   cc:   Donia Guiles, M.D.

## 2010-06-22 NOTE — Op Note (Signed)
[name], [name]              ACCOUNT NO.:  [id number]   MEDICAL RECORD NO.:  [id number]          PATIENT TYPE:  OUT   LOCATION:  CARD                         FACILITY:  [facility]   PHYSICIAN:  Danny Peer, MD    DATE OF BIRTH:  [birth year]   DATE OF PROCEDURE:  09/15/2006  DATE OF DISCHARGE:                               OPERATIVE REPORT   PROCEDURE:  Fiberoptic bronchoscopy.   OPERATOR:  Danny Peer, MD.   INDICATION:  Cough.   MEDICATIONS GIVEN:  Fentanyl 100 mcg IV in divided doses, Versed 4 mg IV  in divided doses, 1% lidocaine to the bronchoalveolar tree for 50 mL  total.   Consent was obtained from the patient and a signed copy is on his  hospital chart.   DESCRIPTION OF PROCEDURE:  After informed consent was obtained from the  patient, conscious sedation was initiated as indicated above. Prior to  his conscious sedation at the time of his peripheral IV placement, [name] became acutely bradycardic and felt a bit clammy.  An EKG  performed at that time showed sinus bradycardia without any other  evidence of significant abnormality.  The episode was consistent with a  vasovagal response.  He improved spontaneously and then was able to  tolerate placement of peripheral IV afterwards.  Once good IV access was  established and his sedation was initiated, he had no more problems.  The fiberoptic bronchoscope was introduced through the right nare. His  glottis was normal in appearance.  He had a large epiglottis but without  any significant evidence of upper airway obstruction. His vocal cords  moved normally with phonation and with inspiration.  The trachea was  intubated and had normal mucosa. The most significant observation was  severe ectasia and collapsibility of the posterior wall of the trachea.  This began approximately 1/3 of the way down the trachea and extended  all the way into the right main stem bronchus and then into the bronchus  intermedius and  to the right lower lobe bronchus.  The airways of the  right upper lobe, middle lobe and lower lobe were all collapsible and  significantly narrowed particularly on inspiration. They did open on  expiration and I was able to inspect them all. On expiration they all  appeared normal. During inspiration, there was almost complete right-  sided small airway collapse and at times there was near occlusion of his  upper airways as well.  Photographs of his airways during both  inspiration and expiration were taken.  There were no overt  endobronchial lesions identified and he did not have any significant  secretions.  The left-sided exam showed a small amount of collapsibility  but this was to a much lesser degree then the right. The airways of the  left were grossly normal in appearance.  The bronchoscope was withdrawn  and the patient had no great difficulty, there were no further episodes  of bradycardia. He tolerated the procedure very well. He  went to the  recovery room in good condition.   SAMPLES:  None.   PLANS:  It is not clear to me whether [name] bronchiectasia is  the cause of or the results of his longstanding frequent cough. If we  are unable to suppress his cough per our current plans to treat his  reflux disease, his postnasal drip, etc., then it may be necessary to  consider stenting if he continues to have cough syncope.  I question  whether he may merit a connective tissue disease workup given his  profound collapsibility of his posterior wall.      Danny Peer, MD  Electronically Signed     RSB/MEDQ  D:  09/15/2006  T:  09/15/2006  Job:  [id number]   cc:   Danny Boop, MD,FACG  [facility]  [address] [place], [place] [address]   Danny Frieze. Jens Som, MD, [facility]  [address]  [address]  [place]  [place] [address]

## 2010-06-22 NOTE — Assessment & Plan Note (Signed)
[facility]                            CARDIOLOGY OFFICE NOTE   NAME:Corales, ACY ORSAK                     MRN:          [id number]  DATE:06/27/2007                            DOB:          Dec 28, 1945    Mr. Vanover is a pleasant gentleman who has a history of aortic valve  replacement with a porcine valve.  He was admitted to the hospital in  January, with atypical chest pain and ruled out for myocardial  infarction.  We did not feel that further cardiac evaluation was  warranted.  He also had hyponatremia and his medications were adjusted.  He was also treated for a UTI.  Since that time, he has done reasonably  well.  There is some dyspnea on exertion which has been a chronic issue,  but there is no orthopnea, PND, pedal edema, palpitations, presyncope or  chest pain.   PRESENT MEDICATIONS:  1. Metoprolol 50 mg p.o. b.i.d.  2. Hydrochlorothiazide 12.5 mg p.o. b.i.d.  3. Terazosin 1 mg p.o. daily.  4. Fish oil.  5. Cozaar 100 mg p.o. daily.  6. Zegerid.  7. Pro-Air.  8. Nasacort.  9. Triazolam.  10.Hydrocodone.  11.Loratadine.  12.Nystatin.  13.Tramadol.  14.Sulfamethoxazole.  15.Aspirin 81 mg p.o. daily.  16.Iron.  17.__________ultra.   PHYSICAL EXAMINATION:  Today, shows a blood pressure of 118/80 and the  pulse is 54.  He weighs 212 pounds.  HEENT:  Normal.  NECK:  Supple.  CHEST:  Clear.  CARDIOVASCULAR:  Reveals a regular rate and rhythm.  There is a 1/6  systolic ejection murmur at the left sternal border.  There is no  diastolic murmur.  ABDOMEN:  Shows no tenderness.  EXTREMITIES:  Show no edema.   Electrocardiogram shows sinus bradycardia at a rate of 53.  There are no  ST changes noted.   DIAGNOSES:  1. Status post aortic valve replacement - the patient did have an      echocardiogram performed on January 01, 2007, and his valve was      functioning appropriately.  He is scheduled for follow-up CTA of      his aortic  root, and we will await those results.  He will continue      the SBE prophylaxis.  2. History of aortic replacement - as per #1.  He is scheduled for a      follow-up CTA.  3. History of cough and syncope.  4. Hypertension - his blood pressure is adequately controlled on his      present medications.  He is off his calcium blocker.  If it      increases in the future, he could resume this.  5. Hyperlipidemia - he is off of his Lescol and apparently Dr.      Arvilla Market is managing this.  6. History of presumed ALLERGY TO AVODART.  7. History of benign prostatic hypertrophy.   We will see him back after a CTA.     Madolyn Frieze Jens Som, MD, [facility]  Electronically Signed    BSC/MedQ  DD: 06/27/2007  DT: 06/27/2007  Job #:  [id number]   cc:   Donia Guiles, M.D.

## 2010-06-22 NOTE — H&P (Signed)
[name], [name]              ACCOUNT NO.:  [id number]   MEDICAL RECORD NO.:  [id number]          PATIENT TYPE:  INP   LOCATION:  2023                         FACILITY:  [facility]   PHYSICIAN:  Corinna L. Lendell Caprice, MDDATE OF BIRTH:  09/06/1945   DATE OF ADMISSION:  09/19/2008  DATE OF DISCHARGE:                              HISTORY & PHYSICAL   PRIMARY CARE PHYSICIAN:  Donia Guiles, MD   CHIEF COMPLAINT:  scrotal bleeding weakness.   HISTORY OF PRESENT ILLNESS:  Mr. 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  He presented to the ER today because of weakness,  malaise, and near syncopal symptoms.  The patient reports that he was in  his usual state of health until last night.  After showering and towel  drying, he noted a wet sensation at his scrotal region.  He observed  frank red blood on the right scrotum.  He presumed this is where the  blood was coming from.  He used pressure and ice packs, and the bleeding  stopped per his report.  By this morning, the patient had noticed no  further bleeding, but was feeling very weak and although he was sitting  on the couch, he kept feeling like he was passing out, slumping over on  the couch, and becoming very dizzy.  His family brought him to the ER at  which time his hemoglobin was found to be 9.0, last fall in 2009, it was  11.4.  He was also experiencing a lot of nausea and dizziness.  The  emergency room physician examined the patient and he noticed small  varicosities with petechial lesions on the right scrotum and presumed  that this may be a source for the bleeding that was seen by the patient  the previous night.  On digital rectal exam, the patient had heme-  positive stool.   REVIEW OF SYSTEMS:   CONSTITUTIONAL:  No fevers, chills, or myalgias.  He  has new pallor.  In addition, the patient took his usual medications  today including his antihypertensive medicines.  PSYCH:  No depression  or anxiety.  NEURO:  No focal neurological deficits.  No unilateral  numbness or tingling of extremities.  No visual deficits.  CHEST:  Chronic cough, no change, no hemoptysis.  CARDIAC:  No chest pain, no  dyspnea on exertion, no tachy palpitations.  GU:  Chronic urinary  discomfort.  No change from baseline. GI:  Until past 24 hours, no dark  or red blood from rectum.  No nausea, vomiting, diarrhea.  He does have  known hemorrhoids, but has not had any significant bleeding in the past  from these per his report.  States had colonoscopy last year by Dr.  Leone Payor with no significant abnormalities found per his report.  MUSCULOSKELETAL:  The patient has been having right flank pain without  any associated dysuria for 1 week.   SOCIAL  HISTORY:  He is married.  No tobacco.  No alcohol.  His wife  accompanies him to the bedside.   FAMILY HISTORY:  Denies family history of any cancer including GI or  colon cancer.   ALLERGIES:  AVODART, unknown reaction; CODEINE and VICODIN, nausea.   CURRENT MEDICATIONS:  Obtained from the patient's home list and  transcribed to the medication reconciliation sheet are as follows.  1. Albuterol metered-dose inhaler 2 puffs q.i.d.  2. Mometasone furoate 220 mcg 2 puffs inhaled hour sleep.  3. Ketoconazole cream 2% topical twice a day to affected areas.  4. Carbamide peroxide 6.5% ear wash both ears twice daily.  5. Losartan 100 mg daily.  6. Metoprolol 50 mg b.i.d.  7. Hydrochlorothiazide 25 mg one-half tablet daily.  8. Terazosin 2 mg b.i.d.  9. Alprazolam 0.25 mg hour sleep.  10.Amitriptyline 25 mg hour sleep.  11.Lomotil 2 tabs b.i.d.  12.Finasteride 5 mg daily.  13.Loratadine 10 mg daily.  14.Cyclobenzaprine/Flexeril 10 mg t.i.d.  15.Ropinirole 0.5 mg 3  tablets daily.  16.Zegerid 20 mg b.i.d.  17.Aspirin 81 mg daily.  18.Fish oil 2 caps daily.  19.Cysta-Q 540 mg b.i.d.  20.Ibuprofen 200 mg 1-2 tabs as needed for pain.  21.Extra-strength Tylenol p.r.n. pain.  22.Pataday 0.2% eye drops 1 drop each eye daily.  23.Systane Ultra 2.5 mL drops both eyes twice daily.  24.GenTeal gel eye drops 2% both eyes twice daily.   PAST MEDICAL HISTORY:  1. Sleep apnea, on CPAP.  2. syncope.  3. Hypertension.  4. Constipation.  5. Hiatal hernia and GERD.  6. Irritable bowel syndrome.  7. BPH with chronic dysuria.  8. Dyslipidemia.  9. Asthma and seasonal allergies.  10.Chronic cough with associated bronchomalacia found on fiberoptic      bronchoscopy.  11.Transient hyponatremia in previous admissions.  12.Cataracts.  13.Restless leg syndrome.   PAST SURGICAL HISTORY:  1. 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  2. Fiberoptic bronchoscopy in 2008.   PHYSICAL EXAMINATION:  GENERAL:  Pleasant male patient, but pale in  appearance complaining of recent lower GI bleeding symptoms and  weakness.  VITAL SIGNS:  Temp 97.4, BP 105/47, pulse 64 and regular, respirations  20.  PSYCHIATRIC:  The patient is alert and oriented x3.  Affect appropriate  to current situation.  NEUROLOGIC:  Cranial nerves II through XII are grossly intact.  He is  moving all extremities x4 without focal neurological deficits.  EYES:  Sclerae noninjected, but conjunctivae are pale bilaterally.  EARS, NOSE, THROAT:  Ears are symmetrical.  No otorrhea.  Nose is  midline.  No rhinorrhea.  Oral mucous membranes are pink and moist.  NECK:  Supple without appreciable adenopathy.  Thyroid is down and  nonpalpable.  CARDIOVASCULAR:  Soft grade 2/6 systolic murmur.  Pulses regular, not  tachycardiac.  No JVD.  No peripheral edema.  CHEST:  Bilateral lung sounds are clear to auscultation anteriorly and  posteriorly.  Respiratory effort is nonlabored.  He is on  nasal cannula  O2 sating 96%.  ABDOMEN:  Soft, nontender, nondistended.  Bowel sounds are present.  No  obvious hepatosplenomegaly, masses, or bruits.  RECTAL:  Confluence of external hemorrhoidal tags without any bulging or  thrombosed hemorrhoids noted.  No frank red blood seen.  The external  rectum, this area was explored and no obvious fissures were observed on  the external exam.  Digital rectal exam was deferred since previously  done by EDP.  This exam was also  limited by patient positioning in the  bed.  GENITOURINARY:  Male genitalia normal in appearance except for small  petechial type lesions on the right side of the scrotum.  Both testes  are descended and palpable and nontender.  No masses seen.  The patient  is uncircumcised and glans is easily visualized with pulling back of the  penile tissue.  EXTREMITIES:  Symmetrical in appearance without cyanosis or clubbing.  SKIN:  Pale diffusely.  No rashes or lesions otherwise.  MUSCULOSKELETAL:  I am unable to reproduce any right flank pain with  palpation and there is no CVAT.   LABORATORY DATA:  White count 7200; hemoglobin 9, previously 11.4 in  2009; platelets are 194,000; neutrophils 80%.  Urinalysis is negative.  INR 1.0, PT 13.5, PTT 26, total bilirubin 0.7, alkaline phosphatase 55,  AST 42, ALT 31, sodium 129, potassium 3.7, CO2 of 29, chloride 94,  glucose 160, BUN 13, creatinine 1.13.   DIAGNOSTICS:  Chest x-ray shows chronic elevation of the right  hemidiaphragm with stable bibasilar atelectasis.  EKG shows sinus rhythm  without any acute ischemic changes.   IMPRESSION:  1. Lower GI bleed of uncertain etiology, but symptomatic.  2. Acute blood loss anemia secondary to lower GI bleed.  3. Hyponatremia, history of previously inpatient on thiazide      diuretics.  4. Hypertension, currently experiencing relative hypotension.  5. History of sleep apnea, on CPAP.  6. Chronic cough secondary to bronchomalacia.  7.  Cough and laugh-induced syncope.  8. Chronic constipation and known hemorrhoids.  9. Dyslipidemia.  10.Benign prostatic hypertrophy and chronic dysuria.  11.New right flank pain, non-reproducible on exam.   PLAN:  1. Admit inpatient status, [facility], telemetry unit.  2. Consult GI [place].  The patient may need no endoscopy this      admission to clarify the source of the bleeding, most likely      related to chronic constipation and hemorrhoid issues, possibly      deep internal hemorrhoids.  3. Hydrate.  Given the patient's low sodium, we will use normal saline      with potassium, hold his hydrochlorothiazide.  Because of relative      hypotension, we will hold any antihypertensive medications and      check orthostatic vital signs frequently.  4. Followup labs in the morning.  As precaution, check cardiac      isoenzymes.  5. Clear liquids until evaluated by GI.  In the event, endoscopy is      needed this admission.  6. Otherwise continue home medications except for aspirin since he is      having bleeding issues.  No Lovenox for      DVT prophylaxis and we will reevaluate to see if we will use PAS      hose given his history of restless leg syndrome, so we will allow      the patient out of bed with assistance.  7. Continue home CPAP.      Allison L. Rennis Harding, N.P.      Corinna L. Lendell Caprice, MD  Electronically Signed    ALE/MEDQ  D:  09/19/2008  T:  09/20/2008  Job:  [id number]

## 2010-06-22 NOTE — Procedures (Signed)
NAMEONYX, SCHIRMER              ACCOUNT NO.:  [id number]   MEDICAL RECORD NO.:  [id number]          PATIENT TYPE:  OUT   LOCATION:  SLEEP CENTER                 FACILITY:  [facility]   PHYSICIAN:  Barbaraann Share, MD,FCCPDATE OF BIRTH:  05-06-1945   DATE OF STUDY:  11/07/2006                            NOCTURNAL POLYSOMNOGRAM   REFERRING PHYSICIAN:  Leslye Peer, MD   INDICATION FOR STUDY:  Hypersomnia with sleep apnea.  Epworth score  column is 3.   SLEEP ARCHITECTURE:  The patient had a total sleep time of 296 minutes  with no slow wave sleep and only 46 minutes of REM.  Sleep onset latency  was normal and REM onset was normal as well.  Sleep efficiency was  decreased at 78%.   RESPIRATORY DATA:  The patient was found to have 149 hypopneas with six  obstructive apneas and  one central apnea for an apnea/hypopnea index of  32 events per hour.  The events were increased in REM but they were not  positional.  Moderate snoring was noted throughout.   OXYGEN DATA:  The patient had O2 saturation as low as 80% with his  obstructive events.   CARDIAC DATA:  Occasional PVCs were noted but no clinically significant  arrhythmia.   MOVEMENT/PARASOMNIA:  The patient was found to have 358 leg jerks with  four per hour resulting in arousal or awakening.   IMPRESSION/RECOMMENDATION:  1. Moderate obstructive sleep apnea/hypopnea syndrome with an      apnea/hypopnea index of 32 events per hour and O2 desaturation as      low as 80%.  Treatment for this degree of sleep apnea can include      weight loss alone if applicable, upper airway surgery, oral      appliance, and also CPAP.  Clinical correlation is suggested.  2. Occasional premature ventricular contractions without clinically      significant arrhythmia.  3. Very large numbers of leg jerks with what appears to be significant      sleep disruption.  It is unclear how much of this is related to the      patient's sleep disordered  breathing, or whether he may have a      concomitant primary      movement disorder of sleep.  Clinical correlation is suggested      after the patient has been treated appropriately for his      obstructive sleep apnea.      Barbaraann Share, MD,FCCP  Diplomate, American Board of Sleep  Medicine  Electronically Signed     KMC/MEDQ  D:  11/18/2006 14:53:05  T:  11/18/2006 23:28:37  Job:  [id number]

## 2010-06-22 NOTE — Assessment & Plan Note (Signed)
[name]                            CARDIOLOGY OFFICE NOTE   NAME:[name], [name]                     MRN:          [id number]  DATE:12/27/2006                            DOB:          09/26/1945    [name] is a very pleasant gentleman who has a history of aortic  valve replacement with a porcine tissue valve secondary to aortic  stenosis as well as aortic root replacement.  Since I last saw him he  has undergone an extensive evaluation for cough and dyspnea.  This is  being performed by our gastroenterologist as well as Dr. Delton Coombes.  He has  apparently been found to have reflux.  He also has bronchiectasia.  Note, the patient does have dyspnea on exertion but there is no  orthopnea, PND or pedal edema.  He has not had chest pain.  He has had  problems with syncope with coughing.  There is no associated  palpitations.   MEDICATIONS:  1. Hyzaar 100/25 daily.  2. Toprol 100 mg p.o. daily.  3. Lescol 80 mg p.o. daily.  4. Zegerid.  5. ProAir inhaler.  6. Nasacort.  7. Baby aspirin 81 mg p.o. daily.  8. Aleve 220 mg p.o. daily.  9. Fish Oil 1200 mg p.o. daily.  10.Loratadine.  11.Zolpidem.   PHYSICAL EXAM TODAY:  Shows a blood pressure that is elevated.  His  blood pressure is 142/101 and his pulse is 72.  HEENT:  Normal.  NECK:  Supple with no bruits.  CHEST:  Clear.  CARDIOVASCULAR EXAM:  Reveals a regular rate and rhythm.  There is a 2/6  systolic murmur at the left sternal border.  There is no diastolic  murmur noted.  ABDOMINAL EXAM:  Benign.  EXTREMITIES:  No edema.   Electrocardiogram shows a sinus rhythm at a rate of 65.  There is an R  conduction delay but there are no ST changes noted.   DIAGNOSES:  1. Status post aortic valve replacement - We will check an      echocardiogram to reassess his aortic valve.  2. Status post aortic root replacement - We will schedule him to have      a CT angiogram of his aortic root in the  near future.  If it is      stable then he will most likely not need followup studies.  3. Probable cough syncope.  4. Hypertension - His blood pressure is elevated today - I have asked      him to track his blood pressure at home and I will see him back in      6 weeks.  If it is routinely elevated then we will most likely add      Norvasc.  5. Hyperlipidemia - He will continue on his Lescol and this is being      followed by Dr. Arvilla Market including his lipids and liver.  6. History of PRESUMED ALLERGY TO AVODART.  7. Benign prostatic hypertrophy.   [name] will continue with SBE prophylaxis and I will see him back  in 6 weeks as  described above.  We will most likely schedule his CT at  that time.     Madolyn Frieze Jens Som, MD, [facility]  Electronically Signed    BSC/MedQ  DD: 12/27/2006  DT: 12/27/2006  Job #: [id number]   cc:   Donia Guiles, M.D.

## 2010-06-22 NOTE — Procedures (Signed)
[name], [name]              ACCOUNT NO.:  [id number]   MEDICAL RECORD NO.:  [id number]          PATIENT TYPE:  OUT   LOCATION:  SLEEP CENTER                 FACILITY:  [facility]   PHYSICIAN:  Barbaraann Share, MD,FCCPDATE OF BIRTH:  1945/12/07   DATE OF STUDY:                            NOCTURNAL POLYSOMNOGRAM   REFERRING PHYSICIAN:  Leslye Peer, MD   INDICATION FOR STUDY:  Hypersomnia with sleep apnea.  The patient  returns for CPAP optimization.   EPWORTH SLEEPINESS SCORE:  3.   SLEEP ARCHITECTURE:  The patient had a total sleep time of 309 minutes  with no slow wave sleep and decreased REM at 62 minutes.  Sleep onset  latency was prolonged at 38 minutes and REM onset was normal.  Sleep  efficiency was decreased at 85%.   RESPIRATORY DATA:  The patient underwent CPAP titration study with a  medium ResMed Quattro full face mask.  Pressure touch ratio was  initiated at the beginning of the night and the pressure was increased  ultimately to a final level of 14 cm of water pressure for both  obstructive events and snoring.   OXYGEN DATA:  There was O2 desaturation as low as 86% prior to CPAP  optimization.  After that point, there was excellent O2 saturations  throughout.   CARDIAC DATA:  No clinically significant cardiac arrhythmias were noted.   MOVEMENT-PARASOMNIA:  The patient was found to have 399 leg jerks with  no significant arousals or awakenings.   IMPRESSIONS-RECOMMENDATIONS:  (1)  Good control of previously-diagnosed  obstructive sleep apnea with 14 cm of CPAP pressure delivered by a  medium ResMed Quattro full face mask.  (2)  Very large numbers of leg  jerks but no significant arousal or awakenings were noted.  Clinical  correlation is suggested.      Barbaraann Share, MD,FCCP  Diplomate, American Board of Sleep  Medicine  Electronically Signed     KMC/MEDQ  D:  12/19/2006 16:17:14  T:  12/20/2006 10:41:23  Job:  [id number]

## 2010-06-25 NOTE — Cardiovascular Report (Signed)
NAMEMACIO, KISSOON              ACCOUNT NO.:  [id number]   MEDICAL RECORD NO.:  [id number]          PATIENT TYPE:  OIB   LOCATION:  6501                         FACILITY:  [facility]   PHYSICIAN:  Arvilla Meres, M.D. LHCDATE OF BIRTH:  1945-03-29   DATE OF PROCEDURE:  09/13/2004  DATE OF DISCHARGE:                              CARDIAC CATHETERIZATION   PRIMARY CARE PHYSICIAN:  Dr. Arvilla Market   CARDIOLOGIST:  Dr. [name]   PATIENT IDENTIFICATION:  [name] is a 65 year old male who has  experienced progressive shortness of breath.  Echocardiogram revealed severe  aortic stenosis.  He is currently being worked up for aortic valve  replacement and is sent for catheterization to evaluate his coronary  arteries as well as to measure the pressure across his aortic valve.   PROCEDURES PERFORMED:  1.  Selective coronary angiography.  2.  Right heart catheterization with FICK cardiac output and oximetry run.   Of note, after multiple attempts using several catheters, the aortic valve  was crossed with an AL1 and a stiff exchange wire, but unfortunately we were  unable to feed the catheter into the LV due to lack of wire support and a  heavily calcified valve.   DESCRIPTION OF PROCEDURE:  The risks and benefits of catheterization  explained to Mr. Kunzler; consent was signed and placed on the chart.  A 5-  French arterial sheath was placed in right femoral artery.  Using a modified  Seldinger technique.  A 5-French JL5 was used to image the left coronary  system and a 4-French JR4 was used to image the right coronary artery.  Of  note, the right coronary artery did have a bit of an anterior takeoff but we  are able to engage and image the artery.  Multiple catheters including a  bent pigtail, right coronary catheter, and AL1 were used to attempt to cross  the aortic valve.  The valve was finally crossed with the AL1 and the stiff  exchange wire.  However, as above, we were  unable to feed the catheter into  the ventricle.  A 7-French venous sheath was placed in the right femoral  vein and the standard right heart catheterization was performed.  The  patient did have a vagal episode during catheter insertion and was treated  with 1 mg of atropine and a liter of IV fluids.  He left the catheterization  area in stable condition.   FINDINGS:   HEMODYNAMIC RESULTS:  Central aortic pressure was 99/62 with a mean of 79.  RA mean of 5, RV 33/2.  PA was 29/11.  Pulmonary capillary wedge pressure  had a mean of 13.  Oximetry:  Central aortic saturation was 93%.  High SVC  saturation was 73%.  Low SVC  saturation was 72%.  IVC saturation was 79%.  Right atrial saturation was 75%.  RV saturation was 79%.  PA saturation was  74%.  Fick cardiac output was 8.5 L/minute.  Fick cardiac index was 4.1  L/minute/sq m.   CORONARY ANATOMY:  Left main was long, but free of angiographic disease.   Left  circumflex was a large system.  It gave off two large ramus branches  and a small distal OM1.  There is no angiographic CAD.   The LAD was a moderate sized vessel without any significant diagonal  branches.  It did give off several small septal branches.  There is no  angiographic CAD.   The right coronary artery had an anterior takeoff and was tortuous.  It gave  off a large PDA and a large PL.  There was no angiographic CAD.   ASSESSMENT:  1.  Normal coronary arteries without angiographic coronary artery disease.      He does have dual ramus system.  2.  Severe aortic stenosis by echocardiogram with a mean gradient of almost      60 mmHg.  On fluoroscopy the valve is heavily calcified with a mildly      dilated aortic root.  After multiple attempts we were finally able to      cross the valve with an AL1 catheter and stiff exchange wire.  However,      we were unable to feed the catheter into the left ventricular to measure      pressures.  3.  Normal right-sided  pressures with elevated cardiac output.  Oximetry run      reveals no evidence of a shunt.   PLAN:  Refer him to Dr. Cornelius Moras in [facility] for evaluation for aortic valve  replacement.  In discussing the plan with Dr. Jens Som, we will also plan  for a CT scan of his chest to evaluate the size of his aortic root to see if  he would need possible Bental procedure.      Arvilla Meres, M.D. [facility]  Electronically Signed     DB/MEDQ  D:  09/13/2004  T:  09/13/2004  Job:  [id number]   cc:   Donia Guiles, M.D.  [address] [place]  [place] [address]  Fax: [phone]   [name], M.D. [facility]  [address]  [address]  [place]  [place] [address]

## 2010-06-25 NOTE — Op Note (Signed)
[name], [name]              ACCOUNT NO.:  [id number]   MEDICAL RECORD NO.:  [id number]          PATIENT TYPE:  INP   LOCATION:  2311                         FACILITY:  [facility]   PHYSICIAN:  Burna Forts, M.D.DATE OF BIRTH:  [birth year]   DATE OF PROCEDURE:  10/13/2004  DATE OF DISCHARGE:                                 OPERATIVE REPORT   PROCEDURE:  Interoperative transesophageal echocardiogram.   DESCRIPTION OF PROCEDURE:  [name] is a 65 year old gentleman who  presents today for aortic valve replacement by Dr. Tressie Stalker.  He is  brought to the OR on the morning of surgery where, under local anesthesia  with sedation, pulmonary artery catheter and radial arterial lines were  placed.  He is then taken to the OR for routine induction of general  anesthesia after which the trachea is intubated.  Then, the TEE probe is  passed oropharyngeally into the stomach and withdrawn for imaging of the  cardiac structures.   PRE-CORONARY BYPASS TEE EXAMINATION:   Left ventricle:  This is a concentrically hypertrophied left ventricular  chamber seen in both short and long axis.  There is good overall systolic  contraction.  There is a significantly hypertrophied septal wall noted in a  long axis view.  Overall contractility appears to be satisfactory and  adequate.   Mitral valve:  This is a thin, compliant mobile leaflets of the mitral  valve, especially on the tips, however, there is a mitral ring appearance  associated with heavy calcium in the annulus of the mitral valve.  There  appears to be a small sleeve of calcium along the proximal anterior leaflet  as it abuts the aortic valve area.  Doppler examination in a four chamber  view reveals 1+ mitral regurgitant flow, multiple views were obtained,  multiple degrees, again, demonstrating essentially trivial mitral  regurgitant flow.  There was essentially no prolapse, there were no flail  segments noted.   Left atrium:   Normal left atrial chamber.  The intra-atrial septum is  interrogated and is intact.   Aortic valve:  This is a heavily calcified aortic valvular structure.  We  could not even determine whether it was essentially a bicuspid or trileaflet  aortic due to the heavy calcium.  It was essentially fixed, significantly  immobile, and associated with 4+ aortic stenotic jet on Doppler examination  seen in a long axis view.  There is 1+ aortic insufficiency when seen with  the LDO outflow tract.  The stenosis appears slightly eccentric in that the  jet above the level of the stenotic valve appears eccentrically located in  the ascending aorta.  The ascending aorta is, indeed, dilated somewhat.  It  measures about 4.3 to 4.4 in its maximal diameter approximately 1 cm above  and this extends 3-4 cm well into the ascending aorta.   The right ventricle, tricuspid valve, and right atrium are essentially  normal chambers.   The patient was placed on cardiopulmonary bypass.  The diseased and  calcified aortic valve is excised and replaced with a pericardial tissue  valve.  The portion of  the ascending aorta is then removed and replaced with  a conduit by Dr. Tressie Stalker.  The patient was rewarmed, deairing  maneuvers are carried out, and the patient separated from cardiopulmonary  bypass with the initial attempts.   POST CARDIOPULMONARY BYPASS TEE EXAMINATION:  (limited exam)   Left ventricle reveals there is good overall contractility noted in short  and long axis views in this thickened and concentric hypertrophied left  ventricular chamber.  This was a low volume status early but with a  replacement of volume and the addition of inotropes, any dyssynergy that had  been noted in the septal wall area appeared improved, again, with good  overall contractility noted.   Aortic valve:  In the place of the diseased aortic valve could now be seen  the thin leaflets and the surrounding struts of the  pericardial tissue valve  that appeared to be seated in the aortic annular root area appropriately.  It was opened appropriately during systolic contraction and closed  appropriately during diastole.  There is essentially no obstruction to flow  and no aortic insufficiency noted in multiple views including short and long  axis views.  The area of the conduit that had been sutured in place could  also be seen, there were no problems at this area, as well.   Mitral valve:  The mitral valve apparatus appeared as previous without any  significant changes.  The rest of the cardiac exam was as previously  described and the patient returned to the cardiac intensive care unit in  stable condition.           ______________________________  Burna Forts, M.D.     JTM/MEDQ  D:  10/13/2004  T:  10/13/2004  Job:  [id number]

## 2010-06-25 NOTE — Discharge Summary (Signed)
[name], [name]              ACCOUNT NO.:  [id number]   MEDICAL RECORD NO.:  [id number]          PATIENT TYPE:  INP   LOCATION:  2039                         FACILITY:  [facility]   PHYSICIAN:  [name], M.D. [facility] OF BIRTH:  [birth year]   DATE OF ADMISSION:  11/02/2004  DATE OF DISCHARGE:  11/10/2004                                 DISCHARGE SUMMARY   PRINCIPAL DIAGNOSIS:  Fever with status post aortic valve replacement.   SECONDARY DIAGNOSES:  1.  Aortic stenosis with aortic valve replacement with a supracoronary      aortic replacement on October 13, 2004.  Patient had a pericardial      bovine tissue valve placed for this aortic stenosis.  2.  Hypertension.  3.  Hyperlipidemia.  4.  Irritable bowel syndrome.  5.  Hiatal hernia.  6.  Benign prostatic hypertrophy.  7.  Arthritis.  8.  Anemia.  9.  Fever.   ALLERGIES:  None.   PROCEDURES:  1.  Venous Doppler study on November 02, 2004.  2.  CT angiogram of the chest November 02, 2004.  3.  A 2-D echo on November 03, 2004.  4.  CT pelvis with contrast on November 05, 2004.  5.  TEE on November 08, 2004.  6.  CT angiogram of the chest on November 09, 2004.   HISTORY OF PRESENT ILLNESS:  Patient is a 65 year old male who had aortic  valve replacement on October 13, 2004.  Patient had had a pericardial  bovine tissue valve placed for aortic stenosis which was apparently  uncomplicated and had an uncomplicated postoperatively course, however,  patient developed fevers of 101 with chills and diaphoresis, fatigue and  anorexia.  Patient also had chills which prompted him to come into ER.  In  the ER he had a CT scan which had shown a fluid collection surrounding the  ascending root.  There was no clear evidence of infection involving the  valve or the root at that point.  Patient was admitted to find the etiology  for the fluid collection surrounding the aorta.  Blood cultures were drawn  and they were negative  and patient was treated empirically with vancomycin  and ceftazidime.  Echocardiogram  was performed on November 03, 2004, which  showed normal LV function at 65 to 75% and that left ventricular function  was vigorous.  Left ventricular wall thickness was mildly increased.  Left  ventricular wall thickness also showed an increase and a pattern of mild  concentric hypertrophy.  Prosthetic aortic valve at that time showed an  estimated aortic valve area by __________ was 2.03 sq cm.  There was a  mild fibrocalcific change of the aortic root and there was a mild mitral  annular calcification.  There was mild to moderate ventricular hypertrophy.  Left atrium was mildly dilated.  Patient also apparently complained of left  leg pain and swelling and on November 02, 2004, a venous Doppler study was  performed which showed no evidence of DVT, superficial thrombus or Baker's  cyst.  The CT on November 02, 2004, was the one  that indicated that there  was substantial edema and inflammation associated aortic root and ascending  aorta and apparent enhancement of the pericardium and associated tiny low  locules of gas and also a basilar atelectasis.  CVTS saw the patient on  November 02, 2004.  Patient was febrile at that point.  They agreed with IV  vancomycin and cephalosporin.  The etiology of the febrile illness remained  unclear.  Blood cultures were drawn.  UA was drawn which was negative.  Empiric antibiotics were continued and an ID consult was requested.  ID  consult was performed on November 03, 2004, and again the fever was  questionable etiology.  ID recommended CT of abdomen and pelvic with blood  cultures and CT was performed on November 05, 2004.  CT of abdomen showed  small amount of free fluid in the peritoneal cavity of indeterminate  etiology.  Study was otherwise unremarkable.  Patient continued to be low  grade fevers on November 03, 2004, and also complained of sore throats  and  muscle aches at that point.  Patient continued on empiric antibiotic  therapy.   On November 04, 2004, patient continued to have fevers which ranged to  102.2 with some decrease in myalgias.  The patient did have a drop in his  hemoglobin and hematocrit to 8.4 and 24.8 also.  On November 04, 2004, an  ID consult was requested.  Again, etiology unclear what differential of  mediastinitis versus endocarditis postoperatively versus a PE and a TEE was  requested.  Patient had continued empiric coverage of vancomycin,  ceftazidime and rifampin.  TEE was performed on November 08, 2004, which  showed a well seated bioprosthetic aortic valve without vegetations or  perivalvular leak.  The sewing ring of the aortic prosthesis appeared  normal.  A prosthetic aortic valve leaflet appeared normal.  There was no  perivalvular aortic regurgitation.  CT of the abdomen was again ordered on  November 05, 2004, which showed evolutionary changes in appearance of the  ascending aortic graft with slightly less periaortic fluid and better  definition of the margins of the graft.  This also showed resolving mild  mediastinal edema and no unusual gas collections.  On November 05, 2004,  patient became less febrile and was feeling much better by November 06, 2004.  Empiric antibiotics were continued and by November 08, 2004, blood  cultures were negative.  CT was negative.  Temperature was decreased.  TEE  was done on November 08, 2004.  Patient was stable by November 10, 2004.  Antibiotics were stopped on November 09, 2004.  Patient was afebrile.  No  more myalgias.  On November 10, 2004, Dr. Jens Som, saw patient and deemed  patient stable to be discharged to home.   DISCHARGE LABORATORY DATA:  White blood cell count 4.5, red blood cell count  of 3.30, hemoglobin 8.6, hematocrit 25.6, platelet count 485.  Protime 14.8 and INR of 1.1.  Sodium 133, potassium 3.8, chloride 100, CO2 24, glucose  93, BUN 7,  creatinine 0.9, calcium 9.3.   FOLLOW UP:  1.  Follow-up is planned with Dr. Jens Som and is scheduled on November 26, 2004, at 4 p.m.  2.  Follow-up with Dr. Cornelius Moras in approximately two weeks and that is to be      scheduled.  3.  Follow-up with Ulyess Mort, M.D., as an outpatient for patient's      bleeding.  Patient is also  to schedule that appointment.  4.  Patient was also instructed to call [facility] if he has any      increase in his temperature.   DISCHARGE MEDICATIONS:  1.  Toprol XL 50 mg p.o. daily which is a new dose.  He was taking 25      previously.  2.  Protonix 40 mg p.o. daily.  3.  Ultram 50 mg one to two tablets every four to six hours p.o. p.r.n.      pain.  4.  Aspirin 325 mg enteric coated p.o. daily.  That is a new dose.  He was      taking 81 mg previously.  5.  Nu-Iron.  6.  Niferex 150 mg p.o.  7.  Two medications were discontinued, that was Tricor 145 mg p.o. daily and      Avodart 0.5 mg daily.   DURATION OF ENCOUNTER:  Greater than 30 minutes.     ______________________________  April Humphrey, NP    ______________________________  [name], M.D. [facility]    AH/MEDQ  D:  11/10/2004  T:  11/10/2004  Job:  [id number]   cc:   Donia Guiles, M.D.  Fax: [phone]   Salvatore Decent. Cornelius Moras, M.D.  [address]  [place]  [place] [address]

## 2010-06-25 NOTE — Discharge Summary (Signed)
[name], [name]              ACCOUNT NO.:  [id number]   MEDICAL RECORD NO.:  [id number]          PATIENT TYPE:  INP   LOCATION:  2001                         FACILITY:  [facility]   PHYSICIAN:  Salvatore Decent. Cornelius Moras, M.D. DATE OF BIRTH:  1945-07-18   DATE OF ADMISSION:  10/13/2004  DATE OF DISCHARGE:  10/18/2004                                 DISCHARGE SUMMARY   HISTORY OF PRESENT ILLNESS:  The patient is a consult for aortic valve  replacement for Dr. Cornelius Moras.  He was originally seen in the office in  consultation on September 27, 2004.  He was then seen by Dr. Kristin Bruins for  dental clearance prior to surgery.  He has a known history of aortic  stenosis,  hypertension, hyperlipidemia, and benign prostatic hyperplasia as  well as degenerative arthritis.  He has had intermittent syncopal episodes  with dizzy spells in the past.  In March 2001, he underwent a 2-D  echocardiogram  which demonstrated mild aortic stenosis with a mean  transvalvular gradient estimated at 20 mmHg at that time.  He was also noted  to have mild aortic insufficiency and mild dilatation of the aortic root  with normal left ventricular systolic function and mild left ventricular  hypertrophy.  At that time also  noted is a stress nuclear exam which was  negative for coronary ischemia and ejection fraction at rest was 69%.  He  was treated medically.  Over the past year, however, [name] developed  worsening symptoms of exertional shortness of breath.  He became more  prominent around January of this year and over the last several months, his  symptoms progressed to the point where he gets short of breath with  relatively mild physical activity.  He denied any resting shortness of  breath, PND, orthopnea or lower extremity edema.  He did have symptoms of  chest tightness that were mostly positional and exacerbated by lying supine  and also were noted to be chronic and longstanding, not associated with  physical activity  and previously attributed to presence of hiatal hernia.  Over the last six months,  he also underwent arthroscopic surgery of his  right knee and treatment for a bunion on his right foot.  He continued to  experience worsening symptoms of shortness of breath and fatigue.  He was  seen by Dr. Arvilla Market and a 2-D echocardiogram  was done on September 09, 2004.  By report, this reported reprogression of the aortic stenosis to severe with  a mean and peak transvalvular gradient that were 56 and 82 mmHg,  respectively with a corresponding peak velocity across the valve of 4.5  meters per second.  There was also mild aortic insufficiency and mild  dilatation of the aortic root and findings on the valve were consistent with  a functionally bicuspid valve with heavily restricted leaflet motion and  moderate calcification.  Left ventricular systolic function remained  preserved with ejection fraction estimated at 60 to 65%.  The patient then  underwent further cardiology evaluation by Dr. Jens Som including elective  left and right heart catheterization which was performed  by Dr. Gala Romney on  September 13, 2004.  He was found to have insignificant coronary artery  disease.  There was considerable difficulty trying to cross the aortic  valve, however, it was ultimately crossed.  They were unable to measure  pressures across the valve at that time of catheterization.  Right heart  pressures were normal with PA pressures measuring 29/11 with a pulmonary  capillary wedge pressure of 13 and a normal forward cardiac output of 8.5  liters per minute.  The aortic root did look somewhat dilated at the time of  catheterization.  He subsequently underwent a CT angiogram of the chest on  September 15, 2004, and this confirmed the fusiform aneurysmal dilatation of  the proximal ascending thoracic aorta with maximal transverse diameter of  4.6 cm.  The patient was admitted this hospitalization for elective repair.   PAST  MEDICAL HISTORY:  1.  Aortic stenosis.  2.  Hypertension.  3.  Hyperlipidemia.  4.  Irritable bowel syndrome.  5.  Benign prostatic hyperplasia.  6.  Degenerative joint disease with arthritis.  7.  Question vasovagal episodes and frequent syncopal episodes in the past.   PAST SURGICAL HISTORY:  1.  Right knee arthroscopy.  2.  Bunion removal right foot.  3.  Bilateral inguinal hernia repair 17 years ago.   MEDICATIONS PRIOR TO ADMISSION:  1.  Tricor 145 mg daily.  2.  UroXatral 10 mg daily.  3.  Prevacid 30 mg daily.  4.  Hyzaar 50/12.5 one tablet daily.  5.  Toprol XL 50 mg daily.  6.  Multivitamins.  7.  Metamucil.  8.  Ultram p.r.n. for pain.  9.  Over-the-counter sleeping pill.   ALLERGIES:  NO KNOWN DRUG ALLERGIES.   For family history, social history, review of systems and physical  examination please see history and physical done at the time of admission.   HOSPITAL COURSE:  Patient was admitted electively and taken to the operating  room at which time he underwent the following procedure.  Aortic valve  replacement with a 25 mm Carpentier-Edwards PERIMOUNT pericardial tissue  valve with resection and grafting of ascending aorta with a 28 mm Hemashield  graft.  This procedure was performed by Salvatore Decent. Cornelius Moras, M.D., tolerated  well and he was taken to the surgical intensive care unit in stable  condition.   POSTOPERATIVE HOSPITAL COURSE:  Patient did quite well.  He remained  neurologically intact.  He was extubated without difficulty.  All routine  lines, monitors, drainage devices were discontinued in standard fashion.  He  did have a moderate postoperative anemia but remained clinically stable.  He  was mobilized in a routine manner.  He was restarted on his UroXatral for  BPH symptoms.  He was started on Coumadin for the aortic valve replacement. He tolerated a good response to a gentle diuresis with Lasix.  His incisions  healed well without evidence of  infection.  He tolerated routine advancement  in activity using standard protocols.  His overall status was felt to be  quite stable for discharge on October 18, 2004.   DISCHARGE MEDICATIONS:  1.  Aspirin 81 mg daily.  2.  Toprol XL 25 mg daily.  3.  Coumadin alternating 5 and 2.5 mg every other day.  4.  Ultram 50 mg one or two q.4-6h. p.r.n. pain.  5.  Tricor 145 mg daily.  6.  UroXatral 10 mg daily.  7.  Lasix 40 mg daily for seven days.  8.  K-Dur 20 mEq daily for seven days.  9.  Protonix 40 mg daily.   DISCHARGE INSTRUCTIONS:  The patient received written instructions in regard  to medications, activity, diet, wound care and follow-up.   FOLLOW UP:  PT/INR at the [facility].  Additionally, follow-up  appointment with Dr. Jens Som two weeks post discharge, Dr. Cornelius Moras three weeks  post discharge.   FINAL DIAGNOSES:  1.  Severe aortic stenosis, no status post aortic valve replacement.  2.  Ascending aortic aneurysm, now status post resection and grafting.  3.  Postoperative anemia.  4.  Other diagnoses as previously listed per the history.      Rowe Clack, P.A.-C.      Salvatore Decent. Cornelius Moras, M.D.  Electronically Signed    WEG/MEDQ  D:  01/04/2005  T:  01/04/2005  Job:  [id number]   cc:   [name], M.D. [facility]  [address]  [address]  [place]  [place] [address]   Donia Guiles, M.D.  Fax: [phone]

## 2010-06-25 NOTE — Assessment & Plan Note (Signed)
[facility]                              CARDIOLOGY OFFICE NOTE   NAME:[name], [name]                     MRN:          [id number]  DATE:12/19/2005                            DOB:          [birth year]    [name] is a pleasant gentleman who has a history of aortic valve  replacement with a porcine tissue valve secondary to aortic stenosis.  Note,  his catheterization prior to his procedure showed normal coronary arteries.  Since I last saw him he has noticed dyspnea with more moderate activities  but not with routine activities around the house.  This is new.  There is no  orthopnea, PND or pedal edema.  There is no chest pain or syncope.  Note, he  was seen by Dr. Arvilla Market and laboratories were obtained.  His hemoglobin was  13.  He also had a chest x-ray on December 05, 2005, that showed no acute  disease.   His medications at present include:  1. Hyzaar 100/25 mg tablets one p.o. daily.  2. Toprol 100 mg p.o. daily.  3. Lescol XL 80 mg p.o. daily.  4. Aspirin.  5. Aleve.  6. Omega-3 fish oil.   PHYSICAL EXAMINATION:  VITAL SIGNS:  His physical exam today shows a blood  pressure of 140/82 and his pulse is 62.  NECK:  Supple.  CHEST:  Clear.  CARDIOVASCULAR:  A regular rate and rhythm.  There is a 2/6 systolic  ejection murmur but there is no diastolic murmur noted.  EXTREMITIES:  No edema.   His electrocardiogram shows a sinus rhythm at a rate of 62.  There is no RV  conduction delay and nonspecific ST changes are noted.   DIAGNOSES:  1. Dyspnea of uncertain etiology.  2. Status post aortic valve replacement with a pericardial tissue valve.  3. Hypertension.  4. Hyperlipidemia.  5. History of presumed allergic reaction to AVODART.  6. Benign prostatic hypertrophy.   PLAN:  [name] is complaining of dyspnea of uncertain etiology.  He does  not appear to be volume-overloaded on exam, and his cardiac exam shows no  diastolic  murmur.  We will schedule him to have an echocardiogram to  reassess his aortic valve and his LV function.  I will also check a BMET and  a BNP.  Note his chest x-ray showed no acute disease.  If the above is  unremarkable, then I do not think we need to pursue a further cardiac  workup.  We will see him  back in 3 months to make sure that he is stable.  We will also plan to do  follow-up CTs in the future to size his aortic root.     Madolyn Frieze Jens Som, MD, [facility]  Electronically Signed    BSC/MedQ  DD: 12/19/2005  DT: 12/19/2005  Job #: [id number]   cc:   Donia Guiles, M.D.

## 2010-06-25 NOTE — Discharge Summary (Signed)
[place]  Patient:    [name], [name]                     MRN: [id number] Adm. Date:  [id number] Disc. Date: [id number] Attending:  Junious Silk Dictator:   Abelino Derrick, P.A.C. [facility]                           Discharge Summary  DISCHARGE DIAGNOSES: 1. Chest pain of unclear etiology with no ischemia by Cardiolite this    admission. 2. Hypertension.  HOSPITAL COURSE:  The patient is a 65 year old male followed by Dr. Ulyess Mort, who was referred to Dr. Jens Som back in 1999 for systolic murmur. Echocardiogram was done, which showed aortic stenosis and mild aortic insufficiency.  He was admitted on March 30, 1999 with chest pain for further evaluation.  An IV was placed, and he became vagal.  He was given atropine and fluids, and he improved.  An exercise Cardiolite was obtained, which was negative for ischemia.  He was sent home later on March 31, 1999. He will follow up with Dr. Jens Som as an outpatient.  DISCHARGE MEDICATIONS: 1. Toprol XL 100 mg a day. 2. Hyzaar 100/25 once a day. 3. Prevacid 30 mg a day. 4. A baby aspirin q.d.  LABORATORY DATA:  CK-MB and troponin were negative.  White count of 6.1, hemoglobin 13.6, hematocrit 36.9, and platelet count 287.  Sodium 132, potassium of 3.4, BUN 10, and creatinine 0.8.  Chest x-ray showed mild pulmonary vascular congestion.  EKG showed normal sinus rhythm with no acute changes.  DISPOSITION:  The patient was discharged in stable condition.  FOLLOWUP:  He will follow up with Dr. Jens Som as noted. DD:  04/21/99 TD:  04/21/99 Job: [id number]

## 2010-06-25 NOTE — Op Note (Signed)
[name], [name]              ACCOUNT NO.:  [id number]   MEDICAL RECORD NO.:  [id number]          PATIENT TYPE:  INP   LOCATION:  2311                         FACILITY:  [facility]   PHYSICIAN:  Salvatore Decent. Cornelius Moras, M.D. DATE OF BIRTH:  06-02-1945   DATE OF PROCEDURE:  10/13/2004  DATE OF DISCHARGE:                                 OPERATIVE REPORT   PREOPERATIVE DIAGNOSIS:  1.  Severe aortic stenosis  2.  Aneurysm of the ascending thoracic aorta.   POSTOPERATIVE DIAGNOSIS:  1.  Severe aortic stenosis  2.  Aneurysm of the ascending thoracic aorta.   PROCEDURE:  Median sternotomy for aortic valve replacement (25 mm Edwards  bovine pericardial tissue prosthesis) and supracoronary straight graft  resection and grafting of the ascending thoracic aorta.   SURGEON:  Dr. Purcell Nails.   ASSISTANT:  Coral Ceo, P.A.   ANESTHESIA:  General.   BRIEF CLINICAL NOTE:  The patient is a 65 year old gentleman with known  history of aortic stenosis with probable bicuspid aortic valve,  hypertension, hyperlipidemia, and benign prostatic hypertrophy. The patient  has also recently undergone arthroscopic surgery on his right knee. He  presents with worsening symptoms of exertional shortness of breath. A 2-D  echocardiogram demonstrates severe aortic stenosis with peak and mean  transvalvular gradients of 82 and 56 mmHg, respectively. Left ventricular  function is preserved. Cardiac catheterization demonstrates normal coronary  artery anatomy with insignificant coronary artery disease. CT angiogram of  the chest demonstrates aneurysmal dilatation of the ascending thoracic  aorta. A full consultation has been dictated previously.   OPERATIVE CONSENT:  The patient and his wife have been counseled at length  regarding the indications and potential benefits of the aortic valve  replacement. The rationale for concomitant resection and grafting of the  ascending thoracic aorta has also been  reviewed. Alternative choices for  valve replacement have been discussed at length including and most notably  the relative risks and benefits of use of mechanical prosthesis with need  for lifelong anticoagulation with Coumadin versus use of a bioprosthetic  tissue valve with the potential for late valve degeneration and failure.  After considerable discussion, the patient elects to have valve replacement  performed with a bioprosthetic tissue valve in an effort to avoid the need  for anticoagulation with Coumadin. He specifically understands and accepts  all associated risks of surgery including, but not limited to, risk of  death, stroke, myocardial infarction, congestive heart failure, respiratory  failure, pneumonia, bleeding requiring blood transfusion, arrhythmia, heart  block or bradycardia requiring permanent pacemaker, late complications  related to valve replacement and in particular, the possibility of late  valve degeneration and failure requiring redo surgery. All of  his questions  have been addressed.   OPERATIVE NOTE IN DETAIL:  The patient is brought to the operating room on  the above-mentioned date and central monitoring is established by the  anesthesia service under the care and direction of Dr. Sharee Holster.  Specifically, a Swan-Ganz catheter is placed through the right internal  jugular approach. A radial arterial line is placed. Intravenous antibiotics  are administered. Following induction with general endotracheal anesthesia,  Foley catheter is placed. The patient's chest, abdomen, both groins, and  both lower extremities are prepared and draped in sterile manner.   Baseline transesophageal echocardiogram is performed by Dr. Jacklynn Bue. The  patient had severe aortic stenosis with bicuspid aortic valve. There is  heavy calcification in the valve. There is calcification in the mitral valve  but only mild (1+) mitral regurgitation. Left ventricular systolic  function  is normal although there is moderate left ventricular hypertrophy. No other  abnormalities are noted other than the aneurysmal dilatation of the  ascending thoracic aorta.   A median sternotomy incision is performed. The pericardium is opened. The  ascending aorta is aneurysmal but there is no significant atherosclerotic  plaque or calcification appreciated. The aorta tapers down towards normal by  the level of the innominate artery. The patient is heparinized systemically.  The aortic arch is cannulated using a 24-French straight cannula placed on  the anterior surface of the aorta beyond the takeoff of the innominate  artery. The venous cannula is placed through the tip of the right atrial  appendage. Retrograde cardioplegic catheter is placed through the right  atrium into the coronary sinus. Adequate heparinization is verified.  Cardiopulmonary bypass is begun. A left ventricular vent is placed through  the right superior pulmonary vein. An antegrade cardioplegic catheter is  placed in the ascending aorta.   The patient is cooled to 32 degrees systemic temperature. Aortic crossclamp  is applied immediately proximal to the takeoff of the innominate artery.  Cardioplegia is delivered initially in the antegrade fashion through the  aortic root. Supplemental cardioplegia is administered retrograde through  the coronary sinus catheter. Iced saline slush is applied for topical  hypothermia. Initial cardioplegic arrest and myocardial cooling are felt to  be excellent. Repeat doses of cardioplegia are administered intermittently  throughout the crossclamp portion of the operation retrograde through the  coronary sinus catheter to maintain left ventricular septal temperature  below 15 degrees centigrade.   The ascending aorta is transected immediately proximal to the aortic crossclamp just below the level of the takeoff of the innominate artery. The  aneurysmal proximal portion of  the ascending thoracic aorta is now also  transected just at the level of this sinotubular junction. The intervening  segment of the aneurysmal aorta is sent to pathology for routine evaluation.  The aortic valve is inspected. The aortic valve is congenitally bicuspid and  severely stenotic. The valve is heavily calcified and both leaflets are  essentially immobile. The coronary arteries are 180 degrees opposed to each  other in their anatomical origins with the left main coronary artery being  somewhat close to the cusp between the left and noncoronary sinuses of  Valsalva. Aortic valve is excised sharply. There is severe calcification in  the aortic annulus. The aortic annulus is extensively debrided and some of  this calcium extends down into the anterior leaflet of mitral valve.  Decalcification of the annulus is somewhat tedious, but this is completed  uneventfully without complication. Aortic root is now irrigated with copious  iced saline solution. Aortic annulus is sized to accept a 25 mm stented  bioprosthetic tissue valve.   The aortic valve replacement is performed using interrupted 2-0 Ethibond  horizontal mattress pledgeted sutures with pledgets in the subannular  position. A 25 mm Edwards pericardial tissue valve (model number 2700,  serial number [id number]) is secured in place uneventfully. After the valve is  completely  secured in place, the left main and right coronary arteries are  inspected to make sure that they are well away from the sewing cuff of the  valve.   A 28 mm Hemashield straight double velour vascular graft is chosen for  replacement of the ascending thoracic aorta. The 28 mm graft corresponds  almost precisely to the diameter of the aorta at the sinotubular junction.  The proximal anastomoses is constructed using interrupted 2-0 Ethibond  horizontal mattress pledgeted sutures. After this anastomoses was completed,  it is reinforced with BioGlue. The aortic  graft is then trimmed to an  appropriate length with a bevel for the distal anastomoses. The distal  anastomosis is now completed using running 4-0 Prolene suture. A Magoon  needle is placed in the middle of the ascending thoracic aortic graft for  evacuation of all residual air. The patient is placed in steep Trendelenburg  position. One final dose of warm retrograde hot shot cardioplegia is  administered. The lungs are ventilated and the heart allowed to felt to  evacuate any residual air. Aortic crossclamp is removed after total  crossclamp time of 97 minutes.   The retrograde cardioplegic catheter is removed. The heart begins to beat  spontaneously without need for cardioversion. The proximal and distal  anastomosis of the aortic graft is inspected for hemostasis. Epicardial  pacing wires are fixed to the right ventricular outflow tract into the right atrial appendage. The patient is rewarmed to 37 degrees centigrade  temperature. Left ventricular vent is removed.   The patient is weaned from cardiopulmonary bypass without difficulty. The  patient's rhythm at separation from bypass is sinus bradycardia with long  first degree AV block. AV sequential pacing is employed. No inotropic  support is required. Total cardiopulmonary bypass time for the operation is  120 minutes. Follow-up transesophageal echocardiogram performed by Dr.  Jacklynn Bue after separation from bypass demonstrates normal left ventricular  systolic function. There is a well seated bioprosthetic valve in the aortic  position. There is no sign of perivalvular leak and the valve is functioning  normally. There remains trivial mitral regurgitation at best and no other  abnormalities are noted. There is insignificant residual air.   The aortic root vent is removed. The venous and arterial cannula are both  removed uneventfully. Protamine is administered to reverse the  anticoagulation. The mediastinum is irrigated with  saline solution  containing vancomycin. Meticulous surgical hemostasis is ascertained. The  mediastinum is drained using three chest tubes exited through separate stab  incisions inferiorly. The median sternotomy is closed in routine fashion  after reapproximating the pericardium anteriorly over the aortic graft. The  soft tissues anterior to the sternum are closed in multiple layers and the  skin is closed with running subcuticular skin closure.   The patient tolerated the procedure well and is transported to the surgical  intensive care unit in stable condition. There are no intraoperative  complications. All sponge, instrument and needle counts are verified correct  at completion of the operation. No blood products were administered.      Salvatore Decent. Cornelius Moras, M.D.  Electronically Signed     CHO/MEDQ  D:  10/13/2004  T:  10/13/2004  Job:  [id number]   cc:   Donia Guiles, M.D.  [address] [place]  [place] [address]  Fax: [phone]   [name], M.D. [facility]  [address]  [address]  [place]  [place] [address]

## 2010-06-25 NOTE — Discharge Summary (Signed)
NAMECAYNE, [name]              ACCOUNT NO.:  [id number]   MEDICAL RECORD NO.:  [id number]          PATIENT TYPE:  INP   LOCATION:  2039                         FACILITY:  [facility]   PHYSICIAN:  [name], M.D. [facility] OF BIRTH:  December 10, 1945   DATE OF ADMISSION:  11/02/2004  DATE OF DISCHARGE:  11/10/2004                                 DISCHARGE SUMMARY   PRINCIPAL DIAGNOSIS:  Patient was admitted for fever, status post aortic  valve replacement on October 13, 2004.   SECONDARY DIAGNOSES:  1.  Aortic stenosis, status post aortic replacement with pericardial bovine      tissue valve.  2.  Hypertension.  3.  Hyperlipidemia.  4.  Irritable bowel syndrome.  5.  Hiatal hernia.  6.  Benign prostatic hypertrophy.   ALLERGIES:  None.   PROCEDURES PERFORMED:  1.  Venous Doppler study on November 02, 2004, on the lower extremity for      left leg pain and swelling with no evidence of DVT, superficial thrombus      or Baker's cyst.  2.  A 2-D echo on November 03, 2004.  3.  CT of abdomen and pelvis on November 03, 2004.   HISTORY OF PRESENT ILLNESS:  Patient is a 65 year old male who was admitted  on November 02, 2004, for fevers status post aortic valve replacement with  supracoronary aortic root replacement on October 13, 2004, whereby he had  had a pericardial bovine valve placed for aortic stenosis.  Patient has  developed fevers at home about 101 to 100 and was admitted to rule out  etiology of fevers.  Blood cultures were obtained and an echocardiogram  was  obtained. Patient had also developed left leg pain and swelling and venous  Doppler studies were performed on November 02, 2004, which showed no  evidence of DVT, superficial thrombus or Baker's cyst.  Patient remained  febrile with chills and feeling poorly with diffuse myalgias whereby an ID  consult was obtained on November 03, 2004.  ID also ordered a CT of abdomen  and pelvis on November 03, 2004, and  were unable to determine reasons for  fevers at that point.  A 2-D echo was completed on November 03, 2004, which  revealed an overall left ventricular systolic function that was very  vigorous.  EF was estimated at 65 to 75%.  There was no diagnostic evidence  of left ventricular regional wall motion abnormalities.  The left  ventricular wall thickness was mildly increased.  There was a prosthetic  aortic valve with mild fibrocalcification change of the aortic root.  There  was mild mitral annular calcification and mild to moderate ventricular  hypertrophy.  Patient remained febrile with temperatures going up to 102 on  November 03, 2004.  A CT scan was performed on November 02, 2004, and  again  on November 09, 2004.  First CT of November 02, 2004, showed substantial  edema, inflammation associated with aortic root   Dictation ended at this point.     ______________________________  April Humphrey, NP    ______________________________  [name], M.D. [facility]  AH/MEDQ  D:  11/10/2004  T:  11/10/2004  Job:  [id number]   cc:   Donia Guiles, M.D.  Fax: [phone]

## 2010-06-25 NOTE — H&P (Signed)
NAMEDIERRE, CREVIER              ACCOUNT NO.:  [id number]   MEDICAL RECORD NO.:  [id number]          PATIENT TYPE:  INP   LOCATION:  2926                         FACILITY:  [facility]   PHYSICIAN:  Rollene Rotunda, M.D.   DATE OF BIRTH:  22-Oct-1945   DATE OF ADMISSION:  11/02/2004  DATE OF DISCHARGE:                                HISTORY & PHYSICAL   REASON FOR PRESENTATION:  Evaluate patient with fever status post aortic  valve replacement.   HISTORY OF PRESENT ILLNESS:  Patient is a 65 year old patient who had aortic  valve replacement with supracoronary aortic root replacement on October 13, 2004.  He had a pericardial bovine tissue valve placed for aortic stenosis.  The surgery was apparently uncomplicated with a relatively uncomplicated  postoperative course.  The patient had been doing well until Sunday.  He  developed fevers up to 101 per his wife.  He did have chills and  diaphoresis.  He presented to the emergency room.  Blood cultures were drawn  on that day and thus far have been negative.  He was discharged and for the  last two days has felt fatigued and anorexic.  He has had low grade  temperatures to 99 or 100.  He has had intermittent chills.  He has had no  new cough other than what he had postoperatively.  He has had no burning  urination, though he has had difficulty urinating.  He has developed some  difficulty swallowing and a dry mouth.  He presented tonight and ER  evaluation included a CT scan.  This demonstrated a fluid collection  surrounding the ascending root.  There is no evidence of extravasation of  contrast.  There was one small pocket of air within the fluid.  There is no  clear evidence of infection involving the valve or root from this.   The patient has had some muscle aches as well.  He has had bilateral  shoulder pain in his deltoids.  He has also complained of sacral pain but  this has been ongoing since he had to lay in the hospital beds.  He  has also  complained of pain in the left popliteal fossa.  There has been no calf  tenderness or swelling.  Of note, an ultrasound of his leg in the ER  demonstrated no evidence of a Baker's cyst or thrombus.  Patient has been  adequately anticoagulated.   PAST MEDICAL HISTORY:  1.  Aortic stenosis, no coronary obstruction.  2.  Hypertension.  3.  Hyperlipidemia.  4.  Irritable bowel syndrome.  5.  Hiatal hernia.  6.  Benign prostatic hypertrophy.  7.  Arthritis.   PAST SURGICAL HISTORY:  1.  Septoplasty.  2.  Hernia repair.   ALLERGIES:  None.   MEDICATIONS ON DISCHARGE FROM THE HOSPITAL:  1.  Ultram.  2.  Toprol XL 25 mg daily.  3.  Coumadin.  4.  Protonix.   SOCIAL HISTORY:  The patient works in a warehouse.  He is married.  He does  not smoke cigarettes.  He does not drink alcohol.  He quit smoking in 1973.   FAMILY HISTORY:  Noncontributory for early coronary disease, though his  father did have some vague heart problems.   REVIEW OF SYSTEMS:  As stated in the HPI, otherwise negative for other  systems.   PHYSICAL EXAMINATION:  GENERAL:  The patient looks mildly acutely ill, but  is not in distress.  He has a rather flat affect.  VITAL SIGNS:  Temperature 100 orally, heart rate 100, blood pressure 108/83.  HEENT:  Eyelids unremarkable.  Pupils are equal, round, and reactive to  light.  Fundi not visualized.  Oral mucosa unremarkable.  NECK:  No jugular venous distention.  Wave form within normal limits.  Carotid upstroke brisk and symmetric.  No bruits.  No thyromegaly.  LYMPHATICS:  No cervical, axillary, or inguinal adenopathy.  LUNGS:  Clear to auscultation bilaterally.  BACK:  Well healed sternotomy scar without sternal mobility and erythema,  tenderness, or drainage.  HEART:  PMI not displaced or sustained.  S1 and S2 within normal limits.  No  S3.  No S4.  No rubs.  3/6 apical systolic murmur radiating out the aortic  outflow tract.  No diastolic murmurs.   ABDOMEN:  Flat.  Positive bowel sounds.  Normal in frequency and pitch.  No  bruits, rebound, guarding, midline pulsatile mass, hepatomegaly,  splenomegaly.  SKIN:  No rashes.  No nodules.  EXTREMITIES:  2+ upper pulses, 2+ popliteals, 1+ posterior tibialis  bilaterally.  No cyanosis.  No clubbing.  No edema.  No Osler's nodes,  Janeway lesions.  NEUROLOGIC:  Oriented to person, place, and time.  Cranial nerves II-XII  grossly intact.  Motor grossly intact throughout.   EKG:  Sinus rhythm, rate 88, axis within normal limits, intervals within  normal limits, RSR prime V1, no acute ST-T wave changes.   Laboratories pending.   Chest CT as described.   Lower extremity Doppler as described.   ASSESSMENT/PLAN:  Fevers.  The patient does have fevers with chills and  appears somewhat uncomfortable and acutely ill.  Dr. Dorris Fetch has been in  to evaluate him and review the chest CT with me.  At this time there is no  obvious etiology and no clear evidence that the fluid collection surrounding  the aorta is infected.  First set of blood cultures on Sunday are negative.  He has had another one this evening and I will repeat a third.  I will  empirically  cover him with antibiotics choosing vancomycin and ceftazidime.  Will get an  ultrasound of his heart in the morning.  Will check a urinalysis.  He will  be followed by Dr. Cornelius Moras and Dr. Jens Som going forward.  We may need  infectious disease consult if this continues.           ______________________________  Rollene Rotunda, M.D.     JH/MEDQ  D:  11/02/2004  T:  11/03/2004  Job:  [id number]

## 2010-06-25 NOTE — Assessment & Plan Note (Signed)
[facility]                            CARDIOLOGY OFFICE NOTE   NAME:[name], [name]                     MRN:          [id number]  DATE:03/17/2006                            DOB:          10-04-1945    [name] is a pleasant gentleman who has a history of aortic valve  replacement with a porcine tissue valve secondary to aortic stenosis.  When I last saw him he was complaining of some degree of dyspnea.  Note  he did have a chest x-ray performed on December 05, 2005, that showed low  lung volumes but no acute disease.  He also had laboratories drawn at  that time that showed a BNP of 82.  His renal function was normal.  Finally, we scheduled him to have an echocardiogram which was performed  on December 30, 2005.  His LV function was normal.  There was a  bioprosthetic aortic valve with no significant aortic stenosis and a  mean gradient of 4.4 mmHg which is normal.  Since that time, he does  have some dyspnea with more moderate activities but not with routine  activities around the house.  There is no orthopnea, PND, pedal edema or  exertional chest pain.  He is complaining of a cough.  It is not related  to food, nor is it positional.  It is nonproductive and there is no  hemoptysis.   His medications at present include:  1. Hyzaar 100/25 mg tablets one p.o. daily.  2. Toprol-XL 100 mg p.o. daily.  3. Lescol XL 80 mg p.o. daily.  4. Aspirin 81 mg p.o. daily.  5. Aleve 220 mg p.o. daily.  6. Omega 3 vitamin.  7. Nexium 40 mg p.o. daily.  8. Tussionex.   PHYSICAL EXAMINATION TODAY:  VITAL SIGNS:  Shows a blood pressure of  144/95 and his pulse is 87.  NECK:  Supple.  CHEST:  Clear.  CARDIOVASCULAR:  Reveals a regular rate and rhythm.  There is a 2/6  systolic ejection murmur at the left sternal border.  There is no  diastolic murmur noted.  ABDOMEN:  Benign.  EXTREMITIES:  Show no edema.   Electrocardiogram shows a sinus rhythm at a  rate of 87.  Prior lateral  infarct cannot be excluded.   DIAGNOSES:  1. Cough of uncertain etiology.  2. Status post aortic valve replacement with a pericardial tissue      valve.  3. Hypertension.  4. Hyperlipidemia.  5. Presumed allergy to AVODART.  6. Benign prostatic hypertrophy.   PLAN:  [name] is complaining of a cough.  He is not on an ACE  inhibitor and the etiology is not clear to me.  We will ask him to be  seen by one of our pulmonologists for further evaluation.  Note a chest  x-ray in late October was unremarkable.  His blood pressure is elevated  today but he states that it has been in the normal range.  I have asked  him to track this at home and if it continues to be elevated then we can  adjust his  medications as indicated.  We will see him back in  approximately 9 months.  At that time we will repeat his CT to reassess  his aortic root.  He will continue with SBE prophylaxis.     Madolyn Frieze Jens Som, MD, [facility]  Electronically Signed    BSC/MedQ  DD: 03/17/2006  DT: 03/17/2006  Job #: [id number]   cc:   Donia Guiles, M.D.

## 2010-06-25 NOTE — Assessment & Plan Note (Signed)
[facility]                             PULMONARY OFFICE NOTE   NAME:[name], [name]                     MRN:          [id number]  DATE:04/05/2006                            DOB:          11-May-1945    This is a 65 year old white male with past medical history significant  for hypertension, hyperlipidemia, BPH, status post aortic valve  replacement with a pericardial tissue valve, presumed allergic to  Avodart, who was sent by Dr. Jens Ray due to chronic cough.  The patient  stated that he has been having dry cough for about a year with sometimes  some spells that almost makes him to pass out.  Sometimes the cough is  worse at night.  Sometimes the cough wakes him up at night or in the  morning.  Also, he has had some wheezing for the last 3 to 4 months that  he thinks is coming mainly from his throat.  Also, he has history of  heartburn for a long period time that has been treated with different  medications on and off.  He also takes over-the-counter antacid like  Maalox, Rolaids, TUMS.  Recently, he was treated with Nexium, but he  took it only for 3 weeks because he said he did not feel any relief.  He  denies any postnasal drip or nasal congestion.  Sometimes he has morning  hoarseness.  It is noted that the patient is not taking ACE inhibitors.   PAST MEDICAL HISTORY:  Reviewed.   MEDICATIONS:  Reviewed.   PHYSICAL EXAMINATION:  VITAL SIGNS:  Weight 215.  Blood pressure 126/88.  Temperature 97.7.  Pulse 72.  Oxygen saturation 98% on room air.  GENERAL:  This is very pleasant man, in no acute distress with some dry  cough during the exam.  HEENT:  Nose:  His mucosa is normal with septum in the midline with no  deviations.  Pharynx is mildly erythematous.  NECK:  Supple.  No JVD.  No thyromegaly.  No adenopathy.  LUNGS:  Clear to auscultation bilaterally with good air movement.  HEART:  Regular rate and rhythm.  No murmurs.  LOWER  EXTREMITIES:  With no edema.   IMPRESSION:  Chronic cough, probably secondary to uncontrolled  gastroesophageal reflux disease.   PLAN:  At this point, we will treat aggressively his GERD with Zegerid 1  tablet p.o. b.i.d. for 7 days, and then 1 daily.  Also, we will schedule  him for PFTs to rule out any other bronchial cause of his cough.  We  will see him back with the PFTs.      Dennis Bast, MD      Leslye Peer, MD  Electronically Signed   YC/MedQ  DD: 04/05/2006  DT: 04/05/2006  Job #: [id number]   cc:   [name]. Danny Som, MD, [facility]

## 2010-06-25 NOTE — H&P (Signed)
[facility]  Patient:    [name], [name]                     MRN: [id number] Adm. Date:  [id number] Disc. Date: [id number] Attending:  Feliciana Rossetti                         History and Physical  CHIEF COMPLAINT:  Arm pain and fever.  HISTORY OF PRESENT ILLNESS:  This is a 65 year old white male with chief complaint of acute onset of right elbow pain beginning the day following an intramuscular injection to the right shoulder.  The patient had acute onset of fevers to 102 and chills and inability to bend elbow.  He was treated initially as an outpatient with intramuscular Rocephin and patient worsened despite this therapy.  Patient denies nausea, vomiting, diarrhea, headache, lightheadedness, focal numbness, paresthesias, or weakness.  No chest pain, no palpitations, no skin breakdown, no recent arthropod exposures.  No change in urine habits.  PHYSICAL EXAMINATION:  VITAL SIGNS:  Temperature 100.2, heart rate 110, respiratory rate 12, blood pressure 130/70.  GENERAL:  Nontoxic, in no acute distress.  HEENT:  Pupils equal, round, and reactive to light.  Extraocular movements intact.  Fundi without exudate or hemorrhage.  Normal disk edges.  Mucous membranes are moist.  Oropharynx without lesions.  NECK:  Supple.  Lymph nodes fairly negative.  No carotid bruits.  HEART:  Regular rhythm and rate of 110.  No murmur, rub, or gallop.  LUNGS:  Clear to auscultation.  Good symmetric air movement.  ABDOMEN:  Soft.  No hepatosplenomegaly.  No mass.  No rebound tenderness.  No guarding.  Normal bowel sounds.  EXTREMITIES:  No clubbing, cyanosis, or edema.  NEUROLOGICAL:  Cranial nerves II-XII are grossly within normal limits.  SKIN:  No skin breakdown.  Red, hot, tender, swollen right elbow.  RECTAL:  Rectal tone is normal, with no blood in vault.  DATA:  Elbow film shows questionable fracture and otherwise is within normal limits.  Complete  blood cell count is within normal limits.  Metabolic profile reveals low potassium.  PAST MEDICAL HISTORY: 1. Hypertension. 2. Irritable bowel syndrome. 3. GERD. 4. sNeurocardiogenic syncope.  PAST SURGICAL HISTORY: 1.  Bilateral herniorrhaphy. 2.  Nasal surgery.  FAMILY HISTORY:  Notable for early marked myocardial infarction and catheter of unknown origin.  SOCIAL HISTORY:  No tobacco.  No ethanol.  No illicit drug use.  The patient lives with wife.  CURRENT MEDICATIONS:  Hyzaar, Toprol, Rocephin.  ALLERGIES:  CODEINE.  REVIEW OF SYSTEMS:  No trauma.  No visual change.  See HPI.  ASSESSMENT AND PLAN: 1. Cellulitis:  Intravenous Rocephin and Tequin given concern of septic joint. 2. Hypokalemia:  Replete. 3. Hypertension:  Discontinue Hyzaar.  Use Micardis and Toprol. DD:  12/11/99 TD:  12/12/99 Job: [id number]

## 2010-06-25 NOTE — Assessment & Plan Note (Signed)
[facility]                             PULMONARY OFFICE NOTE   NAME:PUCKETTParrish, Bonn                       MRN:          [id number]  DATE:05/17/2006                            DOB:          Nov 27, 1945    SUBJECTIVE:  [name] is a 65 year old gentleman with a history of  hypertension, and aortic valve replacement, and hyperlipidemia who is  experiencing chronic cough. At our last visit we initiated Zegerid twice  a day for a week and then decreased to once a day. We also arranged to  have pulmonary function testing preformed and he is here to review the  results of these today. He tells me that his cough was decreased to some  extent while he was on the Zegerid twice a day. He is now on it once a  day and does still have some occasional heart burn symptoms but it is  better then when he was on no medication at all. His cough unfortunately  appears to be somewhat increased since he decreased the dose. He has had  1 episode of paroxysmal cough that was significant enough to cause  syncope. This happened about 4 weeks ago. His cough remains  nonproductive and is bothersome most days. As mentioned he continues to  have heartburn symptoms usually after eating.   CURRENT MEDICATIONS:  1. Hyzaar 100/25 mg daily.  2. Metoprolol 100 mg daily.  3. __________ 80 mg daily.  4. Aspirin 81 mg daily.  5. Zegerid 40/1100 mg once daily.  6. Sleep aid at bedtime p.r.n.   PHYSICAL EXAMINATION:  IN GENERAL: This is a pleasant overweight  gentleman who is in no distress, his weight is 217 pounds, temperature  98.0, blood pressure 138/90, heart rate 63, SPO2 on initial check was  88% on room air, improving to 92% on room air on recheck.  HEENT: His oropharynx is clear. He does have some posterior pharyngeal  erythema. He has slightly slurred speech versus mild dysarthria.  LUNGS: Distant but clear bilaterally.  HEART: Regular rate and rhythm without murmur. He is  borderline  bradycardiac.  ABDOMEN: Obese, soft, nontender, and positive bowel sounds.  EXTREMITIES: No cyanosis, clubbing, or edema.   STUDIES:  PFTs show evidence of mixed lung disease with airflow  limitation confirmed by the presence of a positive broncho dilator  response. His lung volumes are mildly restricted. His DLCO is decreased  but corrects when adjusted for alveolar volume.   IMPRESSION:  1. Chronic cough, I still believe that the driving force behind this      is his gastroesophageal reflux disease but he does have newly      identified airflow limitation with bronchodilator responsiveness      and certainly this may be a component as well. I will increase his      Zegerid back to twice a day and refer him back to see Dr. Victorino Dike who has seen him in the past for other issues. I will      initiate a trial of Symbicort 160/4.5 twice a day  and albuterol      p.r.n. to see if treatment of his airflow limitation helps his      cough. I have warned him that the      medication can actually irritate the upper airway and if his cough      is primarily due to upper      airway irritation his cough may actually worsen on the Symbicort.  2. I will follow up with Mr. Laforte in 1 month to review his      progress.     Leslye Peer, MD  Electronically Signed    RSB/MedQ  DD: 05/17/2006  DT: 05/17/2006  Job #: [id number]

## 2010-06-25 NOTE — Letter (Signed)
September 09, 2005      RE:  TEREZ, FREIMARK  MRN:  [id number]  /  DOB:  January 03, 1946   To whom it may concern:   [name] is a patient of mine in [place], [place].  He  had aortic valve replacement in September 2006.  He was admitted later that  same month on November 02, 2004, to November 10, 2004.  He was noted to have  fever and diaphoresis as well as fatigue and anorexia.  There was concern  about whether he potentially had infection of his recently replaced aortic  valve.  However, this turned out not to be the case.  His blood cultures  were negative as were his urine.  He was treated with antibiotics for a  brief amount of time, but these were discontinued after his cultures were  negative.  It was unclear why the patient developed his fevers and other  symptoms.  However, it was felt to possibly be related to a medication  reaction versus unknown etiology.  It did not appear to be related to his  previous aortic valve replacement.  Therefore, this hospitalization does not  appear to be related to his previous heart surgery and I feel should be  covered.  Please feel free to contact me if there are any concerns or  questions.    Sincerely,      Madolyn Frieze. Jens Som, MD, [facility]   BSC/MedQ  DD:  09/09/2005  DT:  09/10/2005  Job #:  [id number]

## 2010-08-14 ENCOUNTER — Emergency Department (HOSPITAL_COMMUNITY): Payer: Self-pay

## 2010-08-14 ENCOUNTER — Emergency Department (HOSPITAL_COMMUNITY)
Admission: EM | Admit: 2010-08-14 | Discharge: 2010-08-14 | Disposition: A | Discharge status: Home / Self Care | Payer: Self-pay | Attending: Emergency Medicine | Admitting: Emergency Medicine

## 2010-08-14 DIAGNOSIS — E119 Type 2 diabetes mellitus without complications: Secondary | ICD-10-CM | POA: Insufficient documentation

## 2010-08-14 DIAGNOSIS — R55 Syncope and collapse: Secondary | ICD-10-CM | POA: Insufficient documentation

## 2010-08-14 DIAGNOSIS — R110 Nausea: Secondary | ICD-10-CM | POA: Insufficient documentation

## 2010-08-14 DIAGNOSIS — Z79899 Other long term (current) drug therapy: Secondary | ICD-10-CM | POA: Insufficient documentation

## 2010-08-14 DIAGNOSIS — I10 Essential (primary) hypertension: Secondary | ICD-10-CM | POA: Insufficient documentation

## 2010-08-14 DIAGNOSIS — G2581 Restless legs syndrome: Secondary | ICD-10-CM | POA: Insufficient documentation

## 2010-08-14 DIAGNOSIS — K219 Gastro-esophageal reflux disease without esophagitis: Secondary | ICD-10-CM | POA: Insufficient documentation

## 2010-08-14 DIAGNOSIS — Z954 Presence of other heart-valve replacement: Secondary | ICD-10-CM | POA: Insufficient documentation

## 2010-08-14 DIAGNOSIS — E7800 Pure hypercholesterolemia, unspecified: Secondary | ICD-10-CM | POA: Insufficient documentation

## 2010-08-14 LAB — POCT I-STAT, CHEM 8
BUN: 13 mg/dL (ref 6–23)
Chloride: 101 mEq/L (ref 96–112)
Creatinine, Ser: 0.7 mg/dL (ref 0.50–1.35)
Potassium: 3.7 mEq/L (ref 3.5–5.1)
Sodium: 133 mEq/L — ABNORMAL LOW (ref 135–145)

## 2010-08-14 LAB — CBC
MCH: 24.3 pg — ABNORMAL LOW (ref 26.0–34.0)
Platelets: 168 10*3/uL (ref 150–400)
RBC: 4.77 MIL/uL (ref 4.22–5.81)
WBC: 6.8 10*3/uL (ref 4.0–10.5)

## 2010-08-14 LAB — D-DIMER, QUANTITATIVE: D-Dimer, Quant: 0.85 ug/mL-FEU — ABNORMAL HIGH (ref 0.00–0.48)

## 2010-08-14 LAB — DIFFERENTIAL
Basophils Relative: 0 % (ref 0–1)
Eosinophils Absolute: 0.1 10*3/uL (ref 0.0–0.7)
Eosinophils Relative: 1 % (ref 0–5)
Neutrophils Relative %: 75 % (ref 43–77)

## 2010-08-14 LAB — CK TOTAL AND CKMB (NOT AT ARMC)
CK, MB: 4.7 ng/mL — ABNORMAL HIGH (ref 0.3–4.0)
Relative Index: 3.5 — ABNORMAL HIGH (ref 0.0–2.5)
Total CK: 135 U/L (ref 7–232)

## 2010-08-14 MED ORDER — IOHEXOL 300 MG/ML  SOLN: 100.0000 mL | Freq: Once | INTRAMUSCULAR | Status: DC | PRN

## 2010-10-07 ENCOUNTER — Encounter: Payer: Self-pay | Admitting: Cardiology

## 2010-10-13 ENCOUNTER — Encounter: Payer: Self-pay | Admitting: Cardiology

## 2010-10-13 ENCOUNTER — Ambulatory Visit (INDEPENDENT_AMBULATORY_CARE_PROVIDER_SITE_OTHER): Payer: Self-pay | Admitting: Cardiology

## 2010-10-13 DIAGNOSIS — R05 Cough: Secondary | ICD-10-CM | POA: Insufficient documentation

## 2010-10-13 DIAGNOSIS — Z954 Presence of other heart-valve replacement: Secondary | ICD-10-CM

## 2010-10-13 DIAGNOSIS — E785 Hyperlipidemia, unspecified: Secondary | ICD-10-CM

## 2010-10-13 DIAGNOSIS — I359 Nonrheumatic aortic valve disorder, unspecified: Secondary | ICD-10-CM

## 2010-10-13 DIAGNOSIS — I10 Essential (primary) hypertension: Secondary | ICD-10-CM

## 2010-10-13 NOTE — Assessment & Plan Note (Signed)
Patient brought records concerning his blood pressure. He does have occasional spikes but more so is in the normal to low range. Continue present medications and adjust as needed. Potassium and renal function monitored by primary care.

## 2010-10-13 NOTE — Progress Notes (Signed)
HPI: [name] is a pleasant gentleman who has a history of aortic valve replacement with porcine valve.  His most recent echocardiogram was performed in January of 2011. He had hyperdynamic LV function. There was a mildly elevated gradient across his prosthetic aortic valve of 18 mm of mercury. His right atrium and right ventricle were mildly enlarged.  He did have a CTA of his aorta on July 23, 2007.  There was further evolution of the postsurgical changes, status post aortic valve replacement and grafting.  There was no recurrent aneurysm or dissection.  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  Patient also with h/o cough syncope. Since I last saw him in Feb 2011 he denies orthopnea, PND, pedal edema, palpitations, syncope or chest pain. He has dyspnea with more moderate activities relieved with rest. He occasionally has problems with headaches.  Current Outpatient Prescriptions  Medication Sig Dispense Refill  . acetaminophen (TYLENOL) 500 MG tablet Take 1,000 mg by mouth as needed.        Marland Kitchen albuterol (PROAIR HFA) 108 (90 BASE) MCG/ACT inhaler Inhale 2 puffs into the lungs every 4 (four) hours as needed.        Marland Kitchen amoxicillin (AMOXIL) 500 MG capsule Four capsules prior to dentist       . Artificial Tear (GENTEAL) GEL Apply to eye as needed.        Marland Kitchen aspirin 81 MG tablet Take 81 mg by mouth daily.        . carbidopa-levodopa (SINEMET) 25-100 MG per tablet Take 1 tablet by mouth 2 (two) times daily.        . carboxymethylcellulose (REFRESH PLUS) 0.5 % SOLN Place 1 drop into both eyes at bedtime.        . cholecalciferol (VITAMIN D) 1000 UNITS tablet Take 2,000 Units by mouth daily.        . cloNIDine (CATAPRES) 0.2 MG tablet Take 0.2 mg by mouth 2 (two) times daily.        . cyclopentolate (CYCLODRYL) 0.5 % ophthalmic solution Place 1 drop into both eyes 2 (two) times daily.        Marland Kitchen dextromethorphan-guaiFENesin (ROBITUSSIN-DM) 10-100  MG/5ML liquid Take 5 mLs by mouth every 4 (four) hours as needed.        . diphenhydrAMINE (BENADRYL) 50 MG capsule Take 50 mg by mouth every 6 (six) hours as needed.        . docusate sodium (COLACE) 100 MG capsule Take 100 mg by mouth as needed.        . ferrous sulfate 325 (65 FE) MG tablet Take 325 mg by mouth daily with breakfast.        . finasteride (PROSCAR) 5 MG tablet Take 5 mg by mouth every 30 (thirty) days.        . fish oil-omega-3 fatty acids 1000 MG capsule Take 1 g by mouth daily.        Marland Kitchen gabapentin (NEURONTIN) 300 MG capsule Take 300 mg by mouth 2 (two) times daily.        . hydrochlorothiazide 25 MG tablet Take 12.5 mg by mouth daily.        Marland Kitchen ketotifen (ZADITOR) 0.025 % ophthalmic solution Place 1 drop into both eyes daily.        Marland Kitchen loratadine (CLARITIN) 10 MG tablet Take 10 mg by mouth daily.        Marland Kitchen losartan (COZAAR) 100 MG tablet Take 100 mg by  mouth daily.        . metFORMIN (GLUCOPHAGE) 500 MG tablet Take 500 mg by mouth 2 (two) times daily with a meal.        . metoprolol (LOPRESSOR) 50 MG tablet Take 50 mg by mouth 2 (two) times daily.        Marland Kitchen omeprazole-sodium bicarbonate (ZEGERID) 40-1100 MG per capsule Take 1 capsule by mouth 2 (two) times daily.        Bertram Gala Glycol-Propyl Glycol (SYSTANE OP) Apply to eye as needed.        . vitamin B-12 (CYANOCOBALAMIN) 500 MCG tablet Take 500 mcg by mouth as needed.           Past Medical History  Diagnosis Date  . HTN (hypertension)   . GERD (gastroesophageal reflux disease)   . Hyperlipidemia   . External hemorrhoids   . Insomnia   . Allergic rhinitis   . OSA (obstructive sleep apnea)   . OA (osteoarthritis)   . Hiatal hernia   . BPH (benign prostatic hypertrophy)   . Chronic cough     Past Surgical History  Procedure Date  . Aortic valve replacement   . Nasal septoplasty w/ turbinoplasty   . Hernia repair   . Right knee arthroscopy   . Bunionectomy     History   Social History  . Marital  Status: Married    Spouse Name: N/A    Number of Children: N/A  . Years of Education: N/A   Occupational History  . Not on file.   Social History Main Topics  . Smoking status: Former Games developer  . Smokeless tobacco: Not on file  . Alcohol Use: No  . Drug Use: No  . Sexually Active: Not on file   Other Topics Concern  . Not on file   Social History Narrative  . No narrative on file    ROS: no fevers or chills, productive cough, hemoptysis, dysphasia, odynophagia, melena, hematochezia, dysuria, hematuria, rash, seizure activity, orthopnea, PND, pedal edema, claudication. Remaining systems are negative.  Physical Exam: Well-developed well-nourished in no acute distress.  Skin is warm and dry.  HEENT is normal.  Neck is supple. No thyromegaly.  Chest is clear to auscultation with normal expansion.  Cardiovascular exam is regular rate and rhythm. 3/6 systolic murmur left sternal border. No diastolic murmur. Abdominal exam nontender or distended. No masses palpated. Extremities show no edema. neuro grossly intact  ECG 08/16/10 - Sinus rhythm, LAD, nonspecific ST changes.

## 2010-10-13 NOTE — Assessment & Plan Note (Signed)
Continue SBE prophylaxis. We will obtain recent echocardiogram results from the [facility].

## 2010-10-13 NOTE — Assessment & Plan Note (Signed)
No recent episodes

## 2010-10-13 NOTE — Patient Instructions (Signed)
Your physician wants you to follow-up in: ONE YEAR You will receive a reminder letter in the mail two months in advance. If you don't receive a letter, please call our office to schedule the follow-up appointment.  

## 2010-10-13 NOTE — Assessment & Plan Note (Signed)
Management per primary care. 

## 2010-10-29 LAB — POCT I-STAT CREATININE: Operator id: 282201

## 2010-10-29 LAB — COMPREHENSIVE METABOLIC PANEL
ALT: 28
Albumin: 3.8
Alkaline Phosphatase: 65
BUN: 5 — ABNORMAL LOW
CO2: 25
Chloride: 95 — ABNORMAL LOW
Creatinine, Ser: 0.76
GFR calc Af Amer: >60
GFR calc non Af Amer: >60
Glucose, Bld: 92
Potassium: 4.3
Sodium: 127 — ABNORMAL LOW
Total Protein: 6.1

## 2010-10-29 LAB — PROTIME-INR
INR: 1.1
Prothrombin Time: 14.4

## 2010-10-29 LAB — I-STAT 8, (EC8 V) (CONVERTED LAB)
Chloride: 87 — ABNORMAL LOW
Glucose, Bld: 153 — ABNORMAL HIGH
Hemoglobin: 13.6
Potassium: 3.5
Sodium: 121 — ABNORMAL LOW
TCO2: 27
pH, Ven: 7.415 — ABNORMAL HIGH

## 2010-10-29 LAB — URINALYSIS, ROUTINE W REFLEX MICROSCOPIC
Glucose, UA: NEGATIVE
Hgb urine dipstick: NEGATIVE
Hgb urine dipstick: NEGATIVE
Ketones, ur: NEGATIVE
Leukocytes, UA: NEGATIVE
Protein, ur: NEGATIVE
Urobilinogen, UA: 0.2
pH: 7.5

## 2010-10-29 LAB — CBC
Hemoglobin: 10.2 — ABNORMAL LOW
Hemoglobin: 9.8 — ABNORMAL LOW
MCHC: 33
MCV: 74.6 — ABNORMAL LOW
Platelets: 206
RBC: 4.09 — ABNORMAL LOW
RBC: 4.63
RDW: 17.1 — ABNORMAL HIGH
RDW: 17.2 — ABNORMAL HIGH
WBC: 7.9

## 2010-10-29 LAB — BASIC METABOLIC PANEL
BUN: 7
BUN: 8
CO2: 27
CO2: 29
Calcium: 8.5
Calcium: 8.6
Calcium: 8.6
Calcium: 8.7
Calcium: 9.1
Creatinine, Ser: 0.84
Creatinine, Ser: 0.91
Creatinine, Ser: 0.96
GFR calc Af Amer: >60
GFR calc Af Amer: >60
GFR calc Af Amer: >60
GFR calc non Af Amer: >60
GFR calc non Af Amer: >60
Glucose, Bld: 109 — ABNORMAL HIGH
Glucose, Bld: 120 — ABNORMAL HIGH
Potassium: 3.6
Potassium: 3.9
Sodium: 123 — ABNORMAL LOW
Sodium: 129 — ABNORMAL LOW
Sodium: 132 — ABNORMAL LOW

## 2010-10-29 LAB — DIFFERENTIAL
Basophils Absolute: 0
Basophils Relative: 0
Eosinophils Absolute: 0.1
Monocytes Relative: 7
Neutrophils Relative %: 75

## 2010-10-29 LAB — CARDIAC PANEL(CRET KIN+CKTOT+MB+TROPI)
CK, MB: 10 — ABNORMAL HIGH
Relative Index: 2.3
Total CK: 517 — ABNORMAL HIGH

## 2010-10-29 LAB — POCT CARDIAC MARKERS: Operator id: 282201

## 2010-10-29 LAB — HEMOGLOBIN A1C: Mean Plasma Glucose: 193

## 2010-10-29 LAB — TROPONIN I: Troponin I: 0.01

## 2010-10-29 LAB — SODIUM, URINE, RANDOM: Sodium, Ur: 60

## 2010-10-29 LAB — VITAMIN B12: Vitamin B-12: 221 (ref 211–911)

## 2010-10-29 LAB — CK TOTAL AND CKMB (NOT AT ARMC)
CK, MB: 10.2 — ABNORMAL HIGH
Relative Index: 2.4

## 2010-10-29 LAB — OSMOLALITY, URINE: Osmolality, Ur: 224 — ABNORMAL LOW

## 2010-10-29 LAB — CREATININE, URINE, RANDOM: Creatinine, Urine: 43.3

## 2010-10-29 LAB — URINE MICROSCOPIC-ADD ON

## 2010-10-29 LAB — FOLATE: Folate: 13.5

## 2010-10-29 LAB — APTT: aPTT: 28

## 2010-11-08 LAB — URINALYSIS, ROUTINE W REFLEX MICROSCOPIC
Bilirubin Urine: NEGATIVE
Glucose, UA: NEGATIVE
Hgb urine dipstick: NEGATIVE
Ketones, ur: NEGATIVE
Nitrite: NEGATIVE
Protein, ur: NEGATIVE
Specific Gravity, Urine: 1.019
Urobilinogen, UA: 1
pH: 7

## 2010-11-08 LAB — POCT I-STAT 4, (NA,K, GLUC, HGB,HCT)
Glucose, Bld: 97
HCT: 39
Hemoglobin: 13.3
Potassium: 3.8
Sodium: 131 — ABNORMAL LOW

## 2010-11-08 LAB — URINE CULTURE
Colony Count: NO GROWTH
Culture: NO GROWTH

## 2010-12-14 ENCOUNTER — Encounter: Payer: Self-pay | Admitting: *Deleted

## 2010-12-14 NOTE — Telephone Encounter (Signed)
This encounter was created in error - please disregard.

## 2010-12-23 ENCOUNTER — Other Ambulatory Visit: Payer: Self-pay

## 2010-12-23 MED ORDER — AMOXICILLIN 500 MG PO CAPS: 500.0000 mg | ORAL_CAPSULE | Freq: Four times a day (QID) | ORAL | Status: DC

## 2011-03-10 ENCOUNTER — Other Ambulatory Visit: Payer: Self-pay | Admitting: *Deleted

## 2011-03-10 ENCOUNTER — Telehealth: Payer: Self-pay | Admitting: Cardiology

## 2011-03-10 MED ORDER — AMOXICILLIN 500 MG PO CAPS: ORAL_CAPSULE | ORAL | Status: DC

## 2011-03-10 NOTE — Telephone Encounter (Signed)
Walk in pt Form " Pt Dropped Off Letter For [facility]" sent to [facility]  03/10/11/KM

## 2011-08-29 ENCOUNTER — Emergency Department (HOSPITAL_COMMUNITY): Payer: Medicare Other

## 2011-08-29 ENCOUNTER — Encounter (HOSPITAL_COMMUNITY): Payer: Self-pay | Admitting: *Deleted

## 2011-08-29 ENCOUNTER — Inpatient Hospital Stay (HOSPITAL_COMMUNITY)
Admission: EM | Admit: 2011-08-29 | Discharge: 2011-09-02 | DRG: 377 | Disposition: A | Discharge status: Home / Self Care | Payer: Medicare Other | Attending: Internal Medicine | Admitting: Internal Medicine

## 2011-08-29 DIAGNOSIS — N400 Enlarged prostate without lower urinary tract symptoms: Secondary | ICD-10-CM | POA: Diagnosis present

## 2011-08-29 DIAGNOSIS — R079 Chest pain, unspecified: Secondary | ICD-10-CM | POA: Diagnosis not present

## 2011-08-29 DIAGNOSIS — D649 Anemia, unspecified: Secondary | ICD-10-CM | POA: Diagnosis not present

## 2011-08-29 DIAGNOSIS — R1110 Vomiting, unspecified: Secondary | ICD-10-CM | POA: Diagnosis not present

## 2011-08-29 DIAGNOSIS — E871 Hypo-osmolality and hyponatremia: Secondary | ICD-10-CM | POA: Diagnosis present

## 2011-08-29 DIAGNOSIS — Z7982 Long term (current) use of aspirin: Secondary | ICD-10-CM

## 2011-08-29 DIAGNOSIS — E785 Hyperlipidemia, unspecified: Secondary | ICD-10-CM | POA: Diagnosis present

## 2011-08-29 DIAGNOSIS — K644 Residual hemorrhoidal skin tags: Secondary | ICD-10-CM | POA: Diagnosis present

## 2011-08-29 DIAGNOSIS — K219 Gastro-esophageal reflux disease without esophagitis: Secondary | ICD-10-CM | POA: Diagnosis present

## 2011-08-29 DIAGNOSIS — I10 Essential (primary) hypertension: Secondary | ICD-10-CM | POA: Diagnosis present

## 2011-08-29 DIAGNOSIS — E782 Mixed hyperlipidemia: Secondary | ICD-10-CM | POA: Diagnosis present

## 2011-08-29 DIAGNOSIS — R404 Transient alteration of awareness: Secondary | ICD-10-CM | POA: Diagnosis not present

## 2011-08-29 DIAGNOSIS — K922 Gastrointestinal hemorrhage, unspecified: Secondary | ICD-10-CM | POA: Diagnosis not present

## 2011-08-29 DIAGNOSIS — Z79899 Other long term (current) drug therapy: Secondary | ICD-10-CM | POA: Diagnosis not present

## 2011-08-29 DIAGNOSIS — R300 Dysuria: Secondary | ICD-10-CM | POA: Diagnosis not present

## 2011-08-29 DIAGNOSIS — R55 Syncope and collapse: Secondary | ICD-10-CM | POA: Diagnosis present

## 2011-08-29 DIAGNOSIS — R51 Headache: Secondary | ICD-10-CM | POA: Diagnosis not present

## 2011-08-29 DIAGNOSIS — K5730 Diverticulosis of large intestine without perforation or abscess without bleeding: Secondary | ICD-10-CM | POA: Diagnosis present

## 2011-08-29 DIAGNOSIS — K920 Hematemesis: Secondary | ICD-10-CM | POA: Diagnosis present

## 2011-08-29 DIAGNOSIS — K589 Irritable bowel syndrome without diarrhea: Secondary | ICD-10-CM | POA: Diagnosis present

## 2011-08-29 DIAGNOSIS — Z954 Presence of other heart-valve replacement: Secondary | ICD-10-CM

## 2011-08-29 DIAGNOSIS — J189 Pneumonia, unspecified organism: Secondary | ICD-10-CM | POA: Diagnosis not present

## 2011-08-29 DIAGNOSIS — R109 Unspecified abdominal pain: Secondary | ICD-10-CM | POA: Diagnosis not present

## 2011-08-29 DIAGNOSIS — J9819 Other pulmonary collapse: Secondary | ICD-10-CM | POA: Diagnosis not present

## 2011-08-29 DIAGNOSIS — K449 Diaphragmatic hernia without obstruction or gangrene: Secondary | ICD-10-CM | POA: Diagnosis present

## 2011-08-29 DIAGNOSIS — G4700 Insomnia, unspecified: Secondary | ICD-10-CM | POA: Diagnosis present

## 2011-08-29 DIAGNOSIS — Z952 Presence of prosthetic heart valve: Secondary | ICD-10-CM

## 2011-08-29 DIAGNOSIS — M1990 Unspecified osteoarthritis, unspecified site: Secondary | ICD-10-CM | POA: Diagnosis present

## 2011-08-29 DIAGNOSIS — G4733 Obstructive sleep apnea (adult) (pediatric): Secondary | ICD-10-CM | POA: Diagnosis present

## 2011-08-29 DIAGNOSIS — D500 Iron deficiency anemia secondary to blood loss (chronic): Secondary | ICD-10-CM | POA: Diagnosis present

## 2011-08-29 DIAGNOSIS — R918 Other nonspecific abnormal finding of lung field: Secondary | ICD-10-CM | POA: Diagnosis not present

## 2011-08-29 DIAGNOSIS — R0602 Shortness of breath: Secondary | ICD-10-CM | POA: Diagnosis not present

## 2011-08-29 HISTORY — DX: Headache: R51

## 2011-08-29 HISTORY — DX: Diverticulosis of large intestine without perforation or abscess without bleeding: K57.30

## 2011-08-29 HISTORY — DX: Anemia, unspecified: D64.9

## 2011-08-29 LAB — CBC
HCT: 27.5 % — ABNORMAL LOW (ref 39.0–52.0)
Hemoglobin: 8.7 g/dL — ABNORMAL LOW (ref 13.0–17.0)
MCV: 84.1 fL (ref 78.0–100.0)
Platelets: 222 10*3/uL (ref 150–400)
Platelets: 193 10*3/uL (ref 150–400)
RBC: 3.4 MIL/uL — ABNORMAL LOW (ref 4.22–5.81)
RBC: 3.28 MIL/uL — ABNORMAL LOW (ref 4.22–5.81)
RDW: 15.5 % (ref 11.5–15.5)
RDW: 15.4 % (ref 11.5–15.5)
WBC: 6 10*3/uL (ref 4.0–10.5)
WBC: 5.4 10*3/uL (ref 4.0–10.5)
WBC: 5.7 10*3/uL (ref 4.0–10.5)

## 2011-08-29 LAB — GLUCOSE, CAPILLARY: Glucose-Capillary: 103 mg/dL — ABNORMAL HIGH (ref 70–99)

## 2011-08-29 LAB — BASIC METABOLIC PANEL
CO2: 25 mEq/L (ref 19–32)
Chloride: 96 mEq/L (ref 96–112)
Creatinine, Ser: 0.63 mg/dL (ref 0.50–1.35)
GFR calc Af Amer: >90 mL/min (ref >90)
Sodium: 131 mEq/L — ABNORMAL LOW (ref 135–145)

## 2011-08-29 LAB — IRON AND TIBC
Saturation Ratios: 6 % — ABNORMAL LOW (ref 20–55)
UIBC: 366 ug/dL (ref 125–400)

## 2011-08-29 LAB — OCCULT BLOOD X 1 CARD TO LAB, STOOL: Fecal Occult Bld: POSITIVE

## 2011-08-29 LAB — RETICULOCYTES
RBC.: 3.27 MIL/uL — ABNORMAL LOW (ref 4.22–5.81)
Retic Count, Absolute: 173.3 10*3/uL (ref 19.0–186.0)
Retic Ct Pct: 5.3 % — ABNORMAL HIGH (ref 0.4–3.1)

## 2011-08-29 LAB — CARDIAC PANEL(CRET KIN+CKTOT+MB+TROPI)
CK, MB: 3.7 ng/mL (ref 0.3–4.0)
Relative Index: INVALID (ref 0.0–2.5)
Troponin I: <0.3 ng/mL (ref <0.30)

## 2011-08-29 LAB — URINALYSIS, ROUTINE W REFLEX MICROSCOPIC
Glucose, UA: NEGATIVE mg/dL
Ketones, ur: NEGATIVE mg/dL
Leukocytes, UA: NEGATIVE
Nitrite: NEGATIVE
Protein, ur: NEGATIVE mg/dL
pH: 6.5 (ref 5.0–8.0)

## 2011-08-29 LAB — FOLATE: Folate: >20 ng/mL

## 2011-08-29 LAB — FERRITIN: Ferritin: 14 ng/mL — ABNORMAL LOW (ref 22–322)

## 2011-08-29 MED ORDER — INSULIN ASPART 100 UNIT/ML ~~LOC~~ SOLN: 0.0000 [IU] | Freq: Three times a day (TID) | SUBCUTANEOUS | Status: DC

## 2011-08-29 MED ORDER — AMITRIPTYLINE HCL 25 MG PO TABS
25.0000 mg | ORAL_TABLET | Freq: Every day | ORAL | Status: DC
  Administered 2011-08-29 – 2011-09-01 (×4): 25 mg via ORAL
  Filled 2011-08-29 (×5): qty 1

## 2011-08-29 MED ORDER — SODIUM CHLORIDE 0.9 % IV BOLUS (SEPSIS)
500.0000 mL | Freq: Once | INTRAVENOUS | Status: AC
  Administered 2011-08-29: 500 mL via INTRAVENOUS

## 2011-08-29 MED ORDER — INSULIN ASPART 100 UNIT/ML ~~LOC~~ SOLN
0.0000 [IU] | Freq: Every day | SUBCUTANEOUS | Status: DC
  Filled 2011-08-29: qty 0.05

## 2011-08-29 MED ORDER — ONDANSETRON HCL 4 MG/2ML IJ SOLN: 4.0000 mg | Freq: Three times a day (TID) | INTRAMUSCULAR | Status: DC | PRN

## 2011-08-29 MED ORDER — SODIUM CHLORIDE 0.9 % IV SOLN: INTRAVENOUS | Status: DC

## 2011-08-29 MED ORDER — DEXTROMETHORPHAN-GUAIFENESIN 10-100 MG/5ML PO LIQD
5.0000 mL | ORAL | Status: DC | PRN
  Filled 2011-08-29: qty 5

## 2011-08-29 MED ORDER — ONDANSETRON HCL 4 MG/2ML IJ SOLN
4.0000 mg | Freq: Four times a day (QID) | INTRAMUSCULAR | Status: DC | PRN
  Administered 2011-08-31: 4 mg via INTRAVENOUS
  Filled 2011-08-29: qty 2

## 2011-08-29 MED ORDER — FERROUS GLUCONATE 324 (38 FE) MG PO TABS: 324.0000 mg | ORAL_TABLET | Freq: Every day | ORAL | Status: DC

## 2011-08-29 MED ORDER — ACETAMINOPHEN 650 MG RE SUPP: 650.0000 mg | Freq: Four times a day (QID) | RECTAL | Status: DC | PRN

## 2011-08-29 MED ORDER — SODIUM CHLORIDE 0.9 % IJ SOLN
3.0000 mL | Freq: Two times a day (BID) | INTRAMUSCULAR | Status: DC
  Administered 2011-08-31 – 2011-09-01 (×3): 3 mL via INTRAVENOUS

## 2011-08-29 MED ORDER — SODIUM CHLORIDE 0.9 % IV SOLN: INTRAVENOUS | Status: AC

## 2011-08-29 MED ORDER — SODIUM CHLORIDE 0.9 % IV SOLN
INTRAVENOUS | Status: DC
  Administered 2011-08-30: 21:00:00 via INTRAVENOUS

## 2011-08-29 MED ORDER — ALBUTEROL SULFATE HFA 108 (90 BASE) MCG/ACT IN AERS
2.0000 | INHALATION_SPRAY | RESPIRATORY_TRACT | Status: DC | PRN
  Administered 2011-08-31: 2 via RESPIRATORY_TRACT
  Filled 2011-08-29: qty 6.7

## 2011-08-29 MED ORDER — CARBIDOPA-LEVODOPA 25-100 MG PO TABS
1.0000 | ORAL_TABLET | Freq: Two times a day (BID) | ORAL | Status: DC
  Administered 2011-08-29 – 2011-09-02 (×7): 1 via ORAL
  Filled 2011-08-29 (×10): qty 1

## 2011-08-29 MED ORDER — GABAPENTIN 300 MG PO CAPS
300.0000 mg | ORAL_CAPSULE | Freq: Two times a day (BID) | ORAL | Status: DC
Start: 2011-08-29 — End: 2011-09-02
  Administered 2011-08-29 – 2011-09-02 (×8): 300 mg via ORAL
  Filled 2011-08-29 (×9): qty 1

## 2011-08-29 MED ORDER — LORAZEPAM 0.5 MG PO TABS
0.5000 mg | ORAL_TABLET | Freq: Once | ORAL | Status: AC
  Administered 2011-08-30: 0.5 mg via ORAL
  Filled 2011-08-29: qty 1

## 2011-08-29 MED ORDER — DOCUSATE SODIUM 100 MG PO CAPS
100.0000 mg | ORAL_CAPSULE | ORAL | Status: DC | PRN
  Filled 2011-08-29: qty 1

## 2011-08-29 MED ORDER — ONDANSETRON HCL 4 MG PO TABS: 4.0000 mg | ORAL_TABLET | Freq: Four times a day (QID) | ORAL | Status: DC | PRN

## 2011-08-29 MED ORDER — PANTOPRAZOLE SODIUM 40 MG IV SOLR
40.0000 mg | Freq: Two times a day (BID) | INTRAVENOUS | Status: DC
  Administered 2011-08-29 – 2011-09-01 (×7): 40 mg via INTRAVENOUS
  Filled 2011-08-29 (×9): qty 40

## 2011-08-29 MED ORDER — METOPROLOL TARTRATE 100 MG PO TABS
100.0000 mg | ORAL_TABLET | Freq: Two times a day (BID) | ORAL | Status: DC
  Administered 2011-08-29 – 2011-09-02 (×8): 100 mg via ORAL
  Filled 2011-08-29 (×9): qty 1

## 2011-08-29 MED ORDER — ACETAMINOPHEN 325 MG PO TABS
650.0000 mg | ORAL_TABLET | Freq: Four times a day (QID) | ORAL | Status: DC | PRN
  Administered 2011-08-30 – 2011-08-31 (×4): 650 mg via ORAL
  Filled 2011-08-29 (×3): qty 2

## 2011-08-29 NOTE — Progress Notes (Signed)
Initial review for inpatient status is complete. 

## 2011-08-29 NOTE — Consult Note (Signed)
[name]: 3:41 PM 08/29/2011   Referring Provider: Zannie Cove Primary Care Physician:  No primary provider on file. [facility] (no visits since 2009)  Reason for Consultation:  Anemia, dark emesis  HPI: [name] is a 66 y.o. male.  He got dizzy this am walking trash to curb.  Syncope once he was back in [place].  20 minutes or so later, had 2 separate episodes dark emesis, did not see any of the food he had for breakfast.  Takes Pantoprazole once daily.  Still has breakthrough acid reflux 4 or so times a week.  No dysphagia.  No chronic nausea.  Latest EGD and Colon at [facility] in [place] in 11/2010.  No bad findings on EGD per wife.    ROS. Always constipated, BMs every 7 to 14 days, sometimes has diarrhea.  He calls his problems IBD.  Has Miralax to use but elects not to use this, can't say why he does not use this.  Though he has rectal bleeding from hemorrhoids when he has a BM.  Has Mesalamine suppository for PRN use, also does not use this but does use Prep H wipes.  He would like to get the hemorrhoids "fixed" so they will stop bleeding. Burning with urination and incomplete voiding. No fever No sores, rash, itching Restless legs and numb, burining feet Incsomnia. Frequent headaches, uses firicet 2 x day. No blurry vision No trauma from this AMs fall. No swelling in feet.   No chest pain No SOB or cough.  No exercise.      Past Medical History  Diagnosis Date  . HTN (hypertension)   . GERD (gastroesophageal reflux disease)   . Hyperlipidemia   . External hemorrhoids   . Insomnia   . Allergic rhinitis   . OSA (obstructive sleep apnea)   . OA (osteoarthritis)   . Hiatal hernia   . BPH (benign prostatic hypertrophy)   . Chronic cough     Past Surgical History  Procedure Date  . Aortic valve replacement   . Nasal septoplasty w/ turbinoplasty   . Hernia repair    . Right knee arthroscopy   . Bunionectomy     Prior to Admission medications   Medication Sig Start Date End Date Taking? Authorizing Provider  albuterol (PROAIR HFA) 108 (90 BASE) MCG/ACT inhaler Inhale 2 puffs into the lungs every 4 (four) hours as needed.     Yes Historical Provider, MD  amitriptyline (ELAVIL) 25 MG tablet Take 25 mg by mouth at bedtime.   Yes Historical Provider, MD  aspirin 81 MG tablet Take 81 mg by mouth daily.     Yes Historical Provider, MD  butalbital-acetaminophen-caffeine (FIORICET, ESGIC) 50-325-40 MG per tablet Take 1 tablet by mouth 2 (two) times daily.   Yes Historical Provider, MD  carbidopa-levodopa (SINEMET) 25-100 MG per tablet Take 1 tablet by mouth 2 (two) times daily.     Yes Historical Provider, MD  cholecalciferol (VITAMIN D) 1000 UNITS tablet Take 2,000 Units by mouth daily.     Yes Historical Provider, MD  cloNIDine (CATAPRES) 0.2 MG tablet Take 0.2 mg by mouth 2 (two) times daily.     Yes Historical Provider, MD  ferrous gluconate (FERGON) 324 MG tablet Take 324 mg by mouth daily with lunch.   Yes Historical Provider, MD  finasteride (PROSCAR) 5 MG tablet Take 5 mg by mouth daily.    Yes Historical Provider, MD  fish oil-omega-3 fatty acids 1000  MG capsule Take 1 g by mouth daily.     Yes Historical Provider, MD  gabapentin (NEURONTIN) 300 MG capsule Take 300 mg by mouth 2 (two) times daily.     Yes Historical Provider, MD  hydrochlorothiazide 25 MG tablet Take 12.5 mg by mouth daily.     Yes Historical Provider, MD  losartan (COZAAR) 100 MG tablet Take 100 mg by mouth daily.     Yes Historical Provider, MD  metFORMIN (GLUCOPHAGE) 500 MG tablet Take 500 mg by mouth 2 (two) times daily with a meal.     Yes Historical Provider, MD  metoprolol (LOPRESSOR) 100 MG tablet Take 100 mg by mouth 2 (two) times daily.   Yes Historical Provider, MD  pantoprazole (PROTONIX) 40 MG tablet Take 40 mg by mouth daily.   Yes Historical Provider, MD  amoxicillin  (AMOXIL) 500 MG capsule Four capsules prior to dentist 03/10/11   Lewayne Bunting, MD  carboxymethylcellulose (REFRESH PLUS) 0.5 % SOLN Place 1 drop into both eyes at bedtime.      Historical Provider, MD  dextromethorphan-guaiFENesin (ROBITUSSIN-DM) 10-100 MG/5ML liquid Take 5 mLs by mouth every 4 (four) hours as needed.      Historical Provider, MD  diphenhydrAMINE (BENADRYL) 50 MG capsule Take 50 mg by mouth every 6 (six) hours as needed. allergies    Historical Provider, MD  docusate sodium (COLACE) 100 MG capsule Take 100 mg by mouth as needed. constipation    Historical Provider, MD    Scheduled Meds:    . pantoprazole (PROTONIX) IV  40 mg Intravenous Q12H  . sodium chloride  500 mL Intravenous Once   Infusions:    . sodium chloride     PRN Meds:    Allergies as of 08/29/2011 - Review Complete 08/29/2011  Allergen Reaction Noted  . Codeine    . Codeine phosphate  08/10/2006  . Dutasteride      Family History  Problem Relation Age of Onset  . Coronary artery disease    . Hyperlipidemia    . Hypertension        PHYSICAL EXAM: Vital signs in last 24 hours: Pulse Rate:  [55-59] 58  (07/22 1400) Resp:  [17] 17  (07/22 1400) BP: (108-131)/(65-78) 115/71 mmHg (07/22 1400) SpO2:  [19 %-100 %] 99 % (07/22 1400)  General: pale older wm.  nad Head:  No signs trauma, face symmetric  Eyes:  Conj pale Ears:  Not HOH.  Nose:  No discharge Mouth:  Speaks with lisp Neck:  No mass or bruit Lungs:  Clear B.  No cough or SOB Heart: RRR.  Grade 3 to 4 Syst murmer Abdomen:  Soft, NT, ND, no HSM or mass.  Active BS.   Rectal: visual exam noted for external, non-bleeding hemorrhoids   Musc/Skeltl: no joint reddness or swelling Extremities:  No pedal edema  Neurologic:  Pleasant, not a great historian.  Not confused.  Moves all 4s.  Minor anxiety.  No tremor.  Moves all 4s Skin:  No rash or sores.  No purpura or bruises Tattoos:  None seen   Psych:  Pleasant,  cooperative  LAB RESULTS:   Ref. Range 08/14/2010 02:51 08/14/2010 03:42 08/29/2011 10:09  Hemoglobin Latest Range: 13.0-17.0 g/dL 16.1 09.6 (L) 8.9 (L)  HCT Latest Range: 39.0-52.0 % 43.0 35.5 (L) 28.5 (L)  MCV Latest Range: 78.0-100.0 fL  74.4 (L) 83.8  MCH Latest Range: 26.0-34.0 pg  24.3 (L) 26.2  MCHC Latest Range: 30.0-36.0 g/dL  32.7  31.2  RDW Latest Range: 11.5-15.5 %   15.5  Platelets Latest Range: 150-400 K/uL  168 222   BMET Lab Results  Component Value Date   NA 131* 08/29/2011   NA 133* 08/14/2010   NA 136 02/11/2009   K 4.3 08/29/2011   K 3.7 08/14/2010   K 3.5 02/11/2009   CL 96 08/29/2011   CL 101 08/14/2010   CL 102 02/11/2009   CO2 25 08/29/2011   CO2 28 02/11/2009   CO2 26 02/10/2009   GLUCOSE 146* 08/29/2011   GLUCOSE 110* 08/14/2010   GLUCOSE 120* 02/11/2009   BUN 12 08/29/2011   BUN 13 08/14/2010   BUN 4* 02/11/2009   CREATININE 0.63 08/29/2011   CREATININE 0.70 08/14/2010   CREATININE 0.83 02/11/2009   CALCIUM 9.8 08/29/2011   CALCIUM 8.3* 02/11/2009   CALCIUM 8.4 02/10/2009   PT/INR Lab Results  Component Value Date   INR 1.00 01/05/2009   INR 1.0 09/19/2008   INR 1.0 09/19/2008    RADIOLOGY STUDIES: Dg Chest 2 View 08/29/2011  *RADIOLOGY REPORT*  Clinical Data: Syncope, shortness of breath and chest pain  CHEST - 2 VIEW  Comparison: CT PE protocol 08/14/2010, chest radiography 02/09/2009, 09/19/2008  Findings: Persistent low volumes with crowding of the bronchovascular markings is noted.  Left lower lobe atelectasis or early airspace disease noted.  Heart size is upper limits of normal.  Evidence of median sternotomy and aortic valvuloplasty. No pleural effusion.  Mild kyphosis of the approximate thoracolumbar junction.  Stable mild compression deformity at this level.  IMPRESSION: Minimal left lower lobe patchy airspace opacity which could reflect atelectasis but early pneumonia could have a similar appearance. If the patient's symptoms continue, consider PA and lateral chest radiographs  obtained at full inspiration when the patient is clinically able.  Original Report Authenticated By: Harrel Lemon, M.D.    ENDOSCOPIC STUDIES: Colon and E[facility] at [facility]  11/2010  Do not have the records  2008   Bravo pH study ASSESSMENT: This patient does have mild acid reflux while on Zegerid.  There is not a good link to any symptom complex at this time. This is  somewhat difficult to interpret in that I am not sure if things are  linked to his cough. He has a known bronchomalacia problem, and I  suspect that is probably what his trouble is overall.  RECOMMENDATIONS/PLANS: Would continue the Zegerid for the time being.  I think that is helping him control his reflux. He could go to daily  Zegerid. He should follow up with Dr. Delton Coombes regarding his cough. I  cannot think of any other investigations to be performed at this time.  He is to return to see me as planned. If he has persistent GI  symptomatology, I would be happy to see him back.  EGD December 05, 2006 small hiatal hernia.  Colonoscopy   December 05, 2006,  tiny polyp removed in the ascending colon, but not recovered.  Mild sigmoid diverticulosis and internal hemorrhoids and an  irritable bowel response to the scope. He was recommended to take MiraLax for constipation at that time.       IMPRESSION: *  Anemia.  Acute on chronic.  On oral Iron at home *  Dark emesis. *  Syncope. *  Pneumonia.  *  Hx GERD, on daily Protonix. Still a lot of breakthrough sxs.   *  Chronic Coumadin.  Waiting on coags.  *  S/P prosthetic Aortic valve  replacement *  Hyponatremia.  *  IDDM *  Chronic minor bleeding PR assoc with infrequent BMS.  Not compliant with meds that would relieve constipation.  PLAN: *  egd tomorrow mid to late AM.   *  Clears *  transfuse as indicated.  Keep hgb above 8.0.  Follow CBC *  Stop po Iron for now.  This can cause GI upset and cause emesis to be dark, so will confuse  Matters.     LOS: 0  days   [name]  08/29/2011, 3:41 PM Pager: [phone]

## 2011-08-29 NOTE — ED Notes (Signed)
MD at bedside. 

## 2011-08-29 NOTE — Progress Notes (Signed)
Alann Avey, is a 66 y.o. male,   MRN: [id number]  -  DOB - May 15, 1945  Outpatient Primary MD for the patient is No primary provider on file.  in for    Chief Complaint  Patient presents with  . Loss of Consciousness     Blood pressure 131/66, pulse 59, SpO2 100.00%.  Principal Problem:  *Cough syncope Active Problems:  HYPERLIPIDEMIA-MIXED  OBSTRUCTIVE SLEEP APNEA  HYPERTENSION  HIATAL HERNIA  OSTEOARTHRITIS  BENIGN PROSTATIC HYPERTROPHY, HX OF  AORTIC VALVE REPLACEMENT, HX OF  Hyponatremia  Anemia   66 yo hx aortic valve replacement, HTN, BPH presents to ED cc syncope with nausea/vomiting. No complaints CP/palpitation, sob. Reports syncopal events in past and attributes to meds. In ED chest xray with LLL patchy opacity which could reflect early pna, HG 8.9, FOBT neg. Given fluids in ED. Hemodynamically stable. Afebrile, pale appearing.

## 2011-08-29 NOTE — ED Notes (Signed)
Family at bedside. 

## 2011-08-29 NOTE — Consult Note (Signed)
Chart was reviewed and patient was examined. X-rays were reviewed.    I agree with management and plans.  Pt appears to have had a subacute GI bleed.  Suspect UGI source.  His INR is normal and he claims not to be taking coumadin.  Need to r/o active, PUD, AVMs EGD scheduled for tomorrow.  Barbette Hair. Arlyce Dice, M.D., [facility]

## 2011-08-29 NOTE — ED Provider Notes (Addendum)
History     CSN: [id number]  Arrival date & time 08/29/11  4098   First MD Initiated Contact with Patient 08/29/11 1022      Chief Complaint  Patient presents with  . Loss of Consciousness    (Consider location/radiation/quality/duration/timing/severity/associated sxs/prior treatment) HPI Comments: [name] is a 66 y.o. Male who was at home today.  He felt near syncopal, walked into his house, and passed out.  His wife is with him just before this happened and after he awoke, he called her to come back.  The estimated time for loss of consciousness is between 5 and 10 minutes.  He had no injuries and a fall with syncope.  He feels like he slid down.  While holding the counter.  He had eaten prior to the syncope.  Recently, he has had dysuria and urinary frequency.  There is no associated chest pain, cough, shortness of breath, back pain, or headache.  He has previously had syncope.  He is using his medications as usual.  There are no aggravating or palliative factors.  Patient is a 66 y.o. male presenting with syncope. The history is provided by the patient.  Loss of Consciousness    Past Medical History  Diagnosis Date  . HTN (hypertension)   . GERD (gastroesophageal reflux disease)   . Hyperlipidemia   . External hemorrhoids   . Insomnia   . Allergic rhinitis   . OSA (obstructive sleep apnea)   . OA (osteoarthritis)   . Hiatal hernia   . BPH (benign prostatic hypertrophy)   . Chronic cough     Past Surgical History  Procedure Date  . Aortic valve replacement   . Nasal septoplasty w/ turbinoplasty   . Hernia repair   . Right knee arthroscopy   . Bunionectomy     Family History  Problem Relation Age of Onset  . Coronary artery disease    . Hyperlipidemia    . Hypertension      History  Substance Use Topics  . Smoking status: Former Games developer  . Smokeless tobacco: Not on file  . Alcohol Use: No      Review of Systems  Cardiovascular: Positive for  syncope.  All other systems reviewed and are negative.    Allergies  Codeine; Codeine phosphate; and Dutasteride  Home Medications   Current Outpatient Rx  Name Route Sig Dispense Refill  . ALBUTEROL SULFATE HFA 108 (90 BASE) MCG/ACT IN AERS Inhalation Inhale 2 puffs into the lungs every 4 (four) hours as needed.      Marland Kitchen AMITRIPTYLINE HCL 25 MG PO TABS Oral Take 25 mg by mouth at bedtime.    . ASPIRIN 81 MG PO TABS Oral Take 81 mg by mouth daily.      Marland Kitchen BUTALBITAL-APAP-CAFFEINE 50-325-40 MG PO TABS Oral Take 1 tablet by mouth 2 (two) times daily.    Marland Kitchen CARBIDOPA-LEVODOPA 25-100 MG PO TABS Oral Take 1 tablet by mouth 2 (two) times daily.      Marland Kitchen VITAMIN D 1000 UNITS PO TABS Oral Take 2,000 Units by mouth daily.      Marland Kitchen CLONIDINE HCL 0.2 MG PO TABS Oral Take 0.2 mg by mouth 2 (two) times daily.      Marland Kitchen FERROUS GLUCONATE 324 (38 FE) MG PO TABS Oral Take 324 mg by mouth daily with lunch.    Marland Kitchen FINASTERIDE 5 MG PO TABS Oral Take 5 mg by mouth daily.     . OMEGA-3 FATTY ACIDS 1000  MG PO CAPS Oral Take 1 g by mouth daily.      Marland Kitchen GABAPENTIN 300 MG PO CAPS Oral Take 300 mg by mouth 2 (two) times daily.      Marland Kitchen HYDROCHLOROTHIAZIDE 25 MG PO TABS Oral Take 12.5 mg by mouth daily.      Marland Kitchen LOSARTAN POTASSIUM 100 MG PO TABS Oral Take 100 mg by mouth daily.      Marland Kitchen METFORMIN HCL 500 MG PO TABS Oral Take 500 mg by mouth 2 (two) times daily with a meal.      . METOPROLOL TARTRATE 100 MG PO TABS Oral Take 100 mg by mouth 2 (two) times daily.    Marland Kitchen PANTOPRAZOLE SODIUM 40 MG PO TBEC Oral Take 40 mg by mouth daily.    . AMOXICILLIN 500 MG PO CAPS  Four capsules prior to dentist 4 capsule 6  . CARBOXYMETHYLCELLULOSE SODIUM 0.5 % OP SOLN Both Eyes Place 1 drop into both eyes at bedtime.      Marland Kitchen DEXTROMETHORPHAN-GUAIFENESIN 10-100 MG/5ML PO LIQD Oral Take 5 mLs by mouth every 4 (four) hours as needed.      Marland Kitchen DIPHENHYDRAMINE HCL 50 MG PO CAPS Oral Take 50 mg by mouth every 6 (six) hours as needed. allergies    . DOCUSATE  SODIUM 100 MG PO CAPS Oral Take 100 mg by mouth as needed. constipation      BP 115/71  Pulse 58  Resp 17  SpO2 99%  Physical Exam  Nursing note and vitals reviewed. Constitutional: He is oriented to person, place, and time. He appears well-developed and well-nourished.  HENT:  Head: Normocephalic and atraumatic.  Right Ear: External ear normal.  Left Ear: External ear normal.  Eyes: Conjunctivae and EOM are normal. Pupils are equal, round, and reactive to light.  Neck: Normal range of motion and phonation normal. Neck supple.  Cardiovascular: Normal rate, regular rhythm, normal heart sounds and intact distal pulses.   Pulmonary/Chest: Effort normal and breath sounds normal. He exhibits no bony tenderness.  Abdominal: Soft. Normal appearance. There is no tenderness.  Genitourinary:       Nonthrombosed external hemorrhoids.  Normal sphincter tone.  Prostate was not palpable and there is no tenderness in the prostatic bed region.  Stool brown in color.  No impaction  Musculoskeletal: Normal range of motion.  Neurological: He is alert and oriented to person, place, and time. He has normal strength. No cranial nerve deficit or sensory deficit. He exhibits normal muscle tone. Coordination normal.  Skin: Skin is warm, dry and intact.  Psychiatric: He has a normal mood and affect. His behavior is normal. Judgment and thought content normal.    ED Course  Procedures (including critical care time)  Emergency department treatment IV bolus and drip.   Orthostatic vital signs are negative.  Labs Reviewed  CBC - Abnormal; Notable for the following:    RBC 3.40 (*)     Hemoglobin 8.9 (*)     HCT 28.5 (*)     All other components within normal limits  BASIC METABOLIC PANEL - Abnormal; Notable for the following:    Sodium 131 (*)     Glucose, Bld 146 (*)     All other components within normal limits  URINALYSIS, ROUTINE W REFLEX MICROSCOPIC  OCCULT BLOOD X 1 CARD TO LAB, STOOL   Dg  Chest 2 View  08/29/2011  *RADIOLOGY REPORT*  Clinical Data: Syncope, shortness of breath and chest pain  CHEST - 2 VIEW  Comparison: CT PE protocol 08/14/2010, chest radiography 02/09/2009, 09/19/2008  Findings: Persistent low volumes with crowding of the bronchovascular markings is noted.  Left lower lobe atelectasis or early airspace disease noted.  Heart size is upper limits of normal.  Evidence of median sternotomy and aortic valvuloplasty. No pleural effusion.  Mild kyphosis of the approximate thoracolumbar junction.  Stable mild compression deformity at this level.  IMPRESSION: Minimal left lower lobe patchy airspace opacity which could reflect atelectasis but early pneumonia could have a similar appearance. If the patient's symptoms continue, consider PA and lateral chest radiographs obtained at full inspiration when the patient is clinically able.  Original Report Authenticated By: Harrel Lemon, M.D.    Date: 08/29/2011  Rate: 56  Rhythm: normal sinus rhythm  QRS Axis: normal  Intervals: normal  ST/T Wave abnormalities: normal  Conduction Disutrbances:left anterior fascicular block  Narrative Interpretation:   Old EKG Reviewed: unchanged   1. Syncope   2. Anemia   3. GI bleeding       MDM  Nonspecific syncope, with prolonged loss of consciousness.  Without CVA, as his neurologic exam is nonfocal.  Possible cardiac arrhythmia.  No distinct metabolic or infectious processes  Suspected.  Hemoglobin is almost 3 g lower than prior in July 2012.  No known source of blood loss.   Plan: Admit for monitoring and evaluation of anemia        Flint Melter, MD 08/29/11 1420  Flint Melter, MD 08/29/11 1446

## 2011-08-29 NOTE — H&P (Addendum)
PCP: Dr.Okwubbunka at [facility], [facility] Cardiologist: Dr. [name], [place] Gastroenterologist: Dr. Stan Head  Chief Complaint:  Passed out  HPI: [name] is a 66 year old male with history of aortic valve replacement, hypertension, external hemorrhoids, BPH, osteoarthritis was taking his garbage out today, noticed that it is starting to feel very weak and came back into the house and doesn't recall anything else, he then found himself awake on the floor, went up to the phone and called his wife. This was followed by 2 episodes of vomiting, he noted that his vomitus contained brownish material. In addition he also noticed that his blood pressure had been running on the lower side for the last couple of days, had a week upper abdominal discomfort on Saturday which he associated with his IBS. He denies any melena or hematochezia. He reports spontaneous bleeding from his hemorrhoids intermittently for the last 2 weeks. Upon evaluation the emergency room was noted to have hemoglobin of 8.9 which is a drop of 3 g from 2 weeks prior. He reports using only a couple of Aleve over the last 2 weeks for her headaches, denies any chest pain or palpitations over the last 2 days. He also reports awake burning discomfort going up his chest earlier today which resolved. Last EGD and colonoscopy per patient report in the fall of 2012, reportedly unremarkable, this was done at the [place]  Allergies:   Allergies  Allergen Reactions  . Codeine   . Codeine Phosphate     REACTION: unspecified  . Dutasteride       Past Medical History  Diagnosis Date  . HTN (hypertension)   . GERD (gastroesophageal reflux disease)   . Hyperlipidemia   . External hemorrhoids   . Insomnia   . Allergic rhinitis   . OSA (obstructive sleep apnea)   . OA (osteoarthritis)   . Hiatal hernia   . BPH (benign prostatic hypertrophy)   . Chronic cough     Past Surgical History  Procedure Date  . Aortic valve  replacement   . Nasal septoplasty w/ turbinoplasty   . Hernia repair   . Right knee arthroscopy   . Bunionectomy     Prior to Admission medications   Medication Sig Start Date End Date Taking? Authorizing Provider  albuterol (PROAIR HFA) 108 (90 BASE) MCG/ACT inhaler Inhale 2 puffs into the lungs every 4 (four) hours as needed.     Yes Historical Provider, MD  amitriptyline (ELAVIL) 25 MG tablet Take 25 mg by mouth at bedtime.   Yes Historical Provider, MD  aspirin 81 MG tablet Take 81 mg by mouth daily.     Yes Historical Provider, MD  butalbital-acetaminophen-caffeine (FIORICET, ESGIC) 50-325-40 MG per tablet Take 1 tablet by mouth 2 (two) times daily.   Yes Historical Provider, MD  carbidopa-levodopa (SINEMET) 25-100 MG per tablet Take 1 tablet by mouth 2 (two) times daily.     Yes Historical Provider, MD  cholecalciferol (VITAMIN D) 1000 UNITS tablet Take 2,000 Units by mouth daily.     Yes Historical Provider, MD  cloNIDine (CATAPRES) 0.2 MG tablet Take 0.2 mg by mouth 2 (two) times daily.     Yes Historical Provider, MD  ferrous gluconate (FERGON) 324 MG tablet Take 324 mg by mouth daily with lunch.   Yes Historical Provider, MD  finasteride (PROSCAR) 5 MG tablet Take 5 mg by mouth daily.    Yes Historical Provider, MD  fish oil-omega-3 fatty acids 1000 MG capsule Take 1 g by  mouth daily.     Yes Historical Provider, MD  gabapentin (NEURONTIN) 300 MG capsule Take 300 mg by mouth 2 (two) times daily.     Yes Historical Provider, MD  hydrochlorothiazide 25 MG tablet Take 12.5 mg by mouth daily.     Yes Historical Provider, MD  losartan (COZAAR) 100 MG tablet Take 100 mg by mouth daily.     Yes Historical Provider, MD  metFORMIN (GLUCOPHAGE) 500 MG tablet Take 500 mg by mouth 2 (two) times daily with a meal.     Yes Historical Provider, MD  metoprolol (LOPRESSOR) 100 MG tablet Take 100 mg by mouth 2 (two) times daily.   Yes Historical Provider, MD  pantoprazole (PROTONIX) 40 MG tablet Take  40 mg by mouth daily.   Yes Historical Provider, MD  amoxicillin (AMOXIL) 500 MG capsule Four capsules prior to dentist 03/10/11   Lewayne Bunting, MD  carboxymethylcellulose (REFRESH PLUS) 0.5 % SOLN Place 1 drop into both eyes at bedtime.      Historical Provider, MD  dextromethorphan-guaiFENesin (ROBITUSSIN-DM) 10-100 MG/5ML liquid Take 5 mLs by mouth every 4 (four) hours as needed.      Historical Provider, MD  diphenhydrAMINE (BENADRYL) 50 MG capsule Take 50 mg by mouth every 6 (six) hours as needed. allergies    Historical Provider, MD  docusate sodium (COLACE) 100 MG capsule Take 100 mg by mouth as needed. constipation    Historical Provider, MD    Social History:  reports that he has quit smoking. He does not have any smokeless tobacco history on file. He reports that he does not drink alcohol or use illicit drugs.  Family History  Problem Relation Age of Onset  . Coronary artery disease    . Hyperlipidemia    . Hypertension      Review of Systems:  Constitutional: Denies fever, chills, diaphoresis, appetite change and fatigue.  HEENT: Denies photophobia, eye pain, redness, hearing loss, ear pain, congestion, sore throat, rhinorrhea, sneezing, mouth sores, trouble swallowing, neck pain, neck stiffness and tinnitus.   Respiratory: Denies SOB, DOE, cough, chest tightness,  and wheezing.   Cardiovascular: Denies chest pain, palpitations and leg swelling.  Gastrointestinal: Denies nausea, vomiting, abdominal pain, diarrhea, constipation, blood in stool and abdominal distention.  Genitourinary: Denies dysuria, urgency, frequency, hematuria, flank pain and difficulty urinating.  Musculoskeletal: Denies myalgias, back pain, joint swelling, arthralgias and gait problem.  Skin: Denies pallor, rash and wound.  Neurological: Denies dizziness, seizures, syncope, weakness, light-headedness, numbness and headaches.  Hematological: Denies adenopathy. Easy bruising, personal or family bleeding  history  Psychiatric/Behavioral: Denies suicidal ideation, mood changes, confusion, nervousness, sleep disturbance and agitation   Physical Exam: Blood pressure 115/71, pulse 58, resp. rate 17, SpO2 99.00%. Gen.: Alert awake oriented x3 in no acute distress, pale appearing H. EENT: Pupils equal and reactive, oral mucosa is moist, pallor noted, no JVD or lymphadenopathy CVS S1-S2 regular rate rhythm, grade 4 ejection systolic murmur appreciated Lungs clear to auscultation bilaterally Abdomen soft slightly distended nontender no organomegaly no flank tenderness Extremities no edema clubbing or cyanosis warm, 2+ peripheral pulses Neuro no localizing signs Skin: without rashes or braekdown  Labs on Admission:  Results for orders placed during the hospital encounter of 08/29/11 (from the past 48 hour(s))  CBC     Status: Abnormal   Collection Time   08/29/11 10:09 AM      Component Value Range Comment   WBC 6.0  4.0 - 10.5 K/uL    RBC  3.40 (*) 4.22 - 5.81 MIL/uL    Hemoglobin 8.9 (*) 13.0 - 17.0 g/dL    HCT 16.1 (*) 09.6 - 52.0 %    MCV 83.8  78.0 - 100.0 fL    MCH 26.2  26.0 - 34.0 pg    MCHC 31.2  30.0 - 36.0 g/dL    RDW 04.5  40.9 - 81.1 %    Platelets 222  150 - 400 K/uL   BASIC METABOLIC PANEL     Status: Abnormal   Collection Time   08/29/11 10:09 AM      Component Value Range Comment   Sodium 131 (*) 135 - 145 mEq/L    Potassium 4.3  3.5 - 5.1 mEq/L    Chloride 96  96 - 112 mEq/L    CO2 25  19 - 32 mEq/L    Glucose, Bld 146 (*) 70 - 99 mg/dL    BUN 12  6 - 23 mg/dL    Creatinine, Ser 9.14  0.50 - 1.35 mg/dL    Calcium 9.8  8.4 - 78.2 mg/dL    GFR calc non Af Amer >90  >90 mL/min    GFR calc Af Amer >90  >90 mL/min   OCCULT BLOOD X 1 CARD TO LAB, STOOL     Status: Normal   Collection Time   08/29/11 11:24 AM      Component Value Range Comment   Fecal Occult Bld POSITIVE     URINALYSIS, ROUTINE W REFLEX MICROSCOPIC     Status: Normal   Collection Time   08/29/11 12:49  PM      Component Value Range Comment   Color, Urine YELLOW  YELLOW    APPearance CLEAR  CLEAR    Specific Gravity, Urine 1.018  1.005 - 1.030    pH 6.5  5.0 - 8.0    Glucose, UA NEGATIVE  NEGATIVE mg/dL    Hgb urine dipstick NEGATIVE  NEGATIVE    Bilirubin Urine NEGATIVE  NEGATIVE    Ketones, ur NEGATIVE  NEGATIVE mg/dL    Protein, ur NEGATIVE  NEGATIVE mg/dL    Urobilinogen, UA 0.2  0.0 - 1.0 mg/dL    Nitrite NEGATIVE  NEGATIVE    Leukocytes, UA NEGATIVE  NEGATIVE MICROSCOPIC NOT DONE ON URINES WITH NEGATIVE PROTEIN, BLOOD, LEUKOCYTES, NITRITE, OR GLUCOSE <1000 mg/dL.    Radiological Exams on Admission: Dg Chest 2 View  08/29/2011  *RADIOLOGY REPORT*  Clinical Data: Syncope, shortness of breath and chest pain  CHEST - 2 VIEW  Comparison: CT PE protocol 08/14/2010, chest radiography 02/09/2009, 09/19/2008  Findings: Persistent low volumes with crowding of the bronchovascular markings is noted.  Left lower lobe atelectasis or early airspace disease noted.  Heart size is upper limits of normal.  Evidence of median sternotomy and aortic valvuloplasty. No pleural effusion.  Mild kyphosis of the approximate thoracolumbar junction.  Stable mild compression deformity at this level.  IMPRESSION: Minimal left lower lobe patchy airspace opacity which could reflect atelectasis but early pneumonia could have a similar appearance. If the patient's symptoms continue, consider PA and lateral chest radiographs obtained at full inspiration when the patient is clinically able.  Original Report Authenticated By: Harrel Lemon, M.D.    Assessment/Plan  1. Suspected upper GI bleed with concomitant hemorrhoidal bleeding Admit to telemetry bed, he's not actively bleeding anymore and vital signs were stable Start IV PPI twice a day Transfuse 1 unit of PRBC today, type and screen, check CBC every 8 hours  Clear liquid diet I have consulted [facility] today  2. Syncope: I suspect this is secondary to GI  bleed, blood loss, possibly vagal response from 1 Monitor on telemetry, EKG unchanged from prior, Will check 2 sets of cardiac enzymes   3. Anemia secondary to 1, also has history of iron deficiency anemia on supplemental iron  4. Abnormal chest x-ray: Atelectasis versus pneumonia, patient is completely asymptomatic without leukocytosis, will monitor for now and repeat 2 view film in a.m.  5. Hypertension: I will continue his metoprolol with holding parameters, and for now hold his losartan, HCTZ  6. Diabetes mellitus type 2: Hold metformin, sliding scale insulin for now  7. DVT prophylaxis: SCDs  8. Code status: FULL CODE     Time Spent on Admission:  [name] [facility] Pager: [phone] 08/29/2011, 3:50 PM

## 2011-08-29 NOTE — ED Notes (Signed)
Patient was at home, had breakfast and everything was fine.  Pt states that he felt like he was going to pass out.  Next thing he knows he was on the floor.  Unsure of downtime.  Patient called his wife to come home and then they called EMS.  Pt was incontinent and did have vomiting afterwards.  Pt denies any confusion.  Pt feels "out of it"  Patient has previous history of syncope and reason was never determined. Patient hasn't had syncope x 1 year.  Patient was sinus brady on monitor.  Patient has a 18g in LAC.  Pt denies any pain at this time.

## 2011-08-29 NOTE — ED Notes (Signed)
Patient is pale in color. Pt states that he has had increase in fatigue and SOB with exertion over the past couple of weeks. Patient has had bleeding from his hemorrhoids. Pt had syncopal episode.  Pt was incontinent of stool and urine afterwards.  Patient states that he also had emesis x 2 after syncope with dark colored emesis.  Pt denies any pain.  Pt states that he did have abd pain today prior to vomiting.

## 2011-08-30 ENCOUNTER — Encounter (HOSPITAL_COMMUNITY)
Admission: EM | Disposition: A | Discharge status: Home / Self Care | Payer: Self-pay | Source: Home / Self Care | Attending: Internal Medicine

## 2011-08-30 ENCOUNTER — Encounter (HOSPITAL_COMMUNITY): Payer: Self-pay | Admitting: General Practice

## 2011-08-30 DIAGNOSIS — Z954 Presence of other heart-valve replacement: Secondary | ICD-10-CM

## 2011-08-30 DIAGNOSIS — K920 Hematemesis: Secondary | ICD-10-CM

## 2011-08-30 DIAGNOSIS — K922 Gastrointestinal hemorrhage, unspecified: Principal | ICD-10-CM

## 2011-08-30 DIAGNOSIS — R55 Syncope and collapse: Secondary | ICD-10-CM

## 2011-08-30 HISTORY — PX: ESOPHAGOGASTRODUODENOSCOPY: SHX5428

## 2011-08-30 LAB — CBC
Hemoglobin: 9 g/dL — ABNORMAL LOW (ref 13.0–17.0)
MCH: 27 pg (ref 26.0–34.0)
MCV: 84.6 fL (ref 78.0–100.0)
Platelets: 196 10*3/uL (ref 150–400)
RBC: 3.37 MIL/uL — ABNORMAL LOW (ref 4.22–5.81)
RBC: 3.63 MIL/uL — ABNORMAL LOW (ref 4.22–5.81)
RDW: 15.5 % (ref 11.5–15.5)

## 2011-08-30 LAB — URINALYSIS, ROUTINE W REFLEX MICROSCOPIC
Glucose, UA: NEGATIVE mg/dL
Hgb urine dipstick: NEGATIVE
Protein, ur: NEGATIVE mg/dL

## 2011-08-30 LAB — COMPREHENSIVE METABOLIC PANEL
ALT: 5 U/L (ref 0–53)
Alkaline Phosphatase: 48 U/L (ref 39–117)
CO2: 25 mEq/L (ref 19–32)
Chloride: 98 mEq/L (ref 96–112)
GFR calc Af Amer: >90 mL/min (ref >90)
GFR calc non Af Amer: >90 mL/min (ref >90)
Glucose, Bld: 90 mg/dL (ref 70–99)
Potassium: 4 mEq/L (ref 3.5–5.1)
Sodium: 131 mEq/L — ABNORMAL LOW (ref 135–145)
Total Bilirubin: 0.6 mg/dL (ref 0.3–1.2)

## 2011-08-30 LAB — CARDIAC PANEL(CRET KIN+CKTOT+MB+TROPI)
CK, MB: 4 ng/mL (ref 0.3–4.0)
Total CK: 115 U/L (ref 7–232)
Troponin I: <0.3 ng/mL (ref <0.30)

## 2011-08-30 LAB — TYPE AND SCREEN: Unit division: 0

## 2011-08-30 SURGERY — EGD (ESOPHAGOGASTRODUODENOSCOPY)
Anesthesia: Moderate Sedation

## 2011-08-30 MED ORDER — HYDROCODONE-ACETAMINOPHEN 5-325 MG PO TABS
1.0000 | ORAL_TABLET | Freq: Once | ORAL | Status: AC
  Administered 2011-08-30: 1 via ORAL
  Filled 2011-08-30: qty 1

## 2011-08-30 MED ORDER — FENTANYL CITRATE 0.05 MG/ML IJ SOLN
INTRAMUSCULAR | Status: DC | PRN
  Administered 2011-08-30 (×2): 25 ug via INTRAVENOUS

## 2011-08-30 MED ORDER — PEG-KCL-NACL-NASULF-NA ASC-C 100 G PO SOLR
1.0000 | Freq: Once | ORAL | Status: AC
  Administered 2011-08-30: 100 g via ORAL
  Filled 2011-08-30: qty 1

## 2011-08-30 MED ORDER — GLYCOPYRROLATE 0.2 MG/ML IJ SOLN
INTRAMUSCULAR | Status: AC
  Filled 2011-08-30: qty 1

## 2011-08-30 MED ORDER — MIDAZOLAM HCL 10 MG/2ML IJ SOLN
INTRAMUSCULAR | Status: AC
  Filled 2011-08-30: qty 2

## 2011-08-30 MED ORDER — FENTANYL CITRATE 0.05 MG/ML IJ SOLN
INTRAMUSCULAR | Status: AC
  Filled 2011-08-30: qty 2

## 2011-08-30 MED ORDER — PHENOL 1.4 % MT LIQD
1.0000 | OROMUCOSAL | Status: DC | PRN
  Filled 2011-08-30: qty 177

## 2011-08-30 MED ORDER — BUTAMBEN-TETRACAINE-BENZOCAINE 2-2-14 % EX AERO
INHALATION_SPRAY | CUTANEOUS | Status: DC | PRN
  Administered 2011-08-30: 2 via TOPICAL

## 2011-08-30 MED ORDER — MIDAZOLAM HCL 5 MG/5ML IJ SOLN
INTRAMUSCULAR | Status: DC | PRN
  Administered 2011-08-30: 2 mg via INTRAVENOUS
  Administered 2011-08-30 (×2): 1 mg via INTRAVENOUS

## 2011-08-30 MED ORDER — BISACODYL 5 MG PO TBEC
5.0000 mg | DELAYED_RELEASE_TABLET | Freq: Four times a day (QID) | ORAL | Status: AC
  Administered 2011-08-30 (×2): 5 mg via ORAL
  Filled 2011-08-30 (×2): qty 1

## 2011-08-30 MED ORDER — GLYCOPYRROLATE 0.2 MG/ML IJ SOLN
INTRAMUSCULAR | Status: DC | PRN
  Administered 2011-08-30: 0.2 mg via INTRAVENOUS

## 2011-08-30 NOTE — Op Note (Signed)
[facility] [address] [place], [place]  [address]  ENDOSCOPY PROCEDURE REPORT  PATIENT:  [name], [name]  MR#:  [id number] BIRTHDATE:  1945/12/24, 66 yrs. old  GENDER:  male  ENDOSCOPIST:  Barbette Hair. Arlyce Dice, MD Referred by:  PROCEDURE DATE:  08/30/2011 PROCEDURE:  EGD, diagnostic 43235 ASA CLASS:  Class II INDICATIONS:  anemia  MEDICATIONS:   These medications were titrated to patient response per physician's verbal order, Fentanyl 50 mcg IV, Versed 3 mg IV, glycopyrrolate (Robinal) 0.2 mg IV TOPICAL ANESTHETIC:  Cetacaine Spray  DESCRIPTION OF PROCEDURE:   After the risks and benefits of the procedure were explained, informed consent was obtained.  The Pentax Gastroscope [id number] endoscope was introduced through the mouth and advanced to the third portion of the duodenum.  The instrument was slowly withdrawn as the mucosa was fully examined. <<PROCEDUREIMAGES>>  The upper, middle, and distal third of the esophagus were carefully inspected and no abnormalities were noted. The z-line was well seen at the GEJ. The endoscope was pushed into the fundus which was normal including a retroflexed view. The antrum,gastric body, first and second part of the duodenum were unremarkable (see image001, image002, image003, and image004).    Retroflexed views revealed no abnormalities.    The scope was then withdrawn from the patient and the procedure completed.  COMPLICATIONS:  None  ENDOSCOPIC IMPRESSION: 1) Normal EGD RECOMMENDATIONS: 1)  colonoscopy  ______________________________ Barbette Hair. Arlyce Dice, MD  CC:  n. eSIGNED:   Barbette Hair. Ottilie Wigglesworth at 08/30/2011 11:15 AM  Ashby Dawes, [phone]

## 2011-08-30 NOTE — Interval H&P Note (Signed)
History and Physical Interval Note:  08/30/2011 10:53 AM  [name]  has presented today for surgery, with the diagnosis of dark emesis, anemia  The various methods of treatment have been discussed with the patient and family. After consideration of risks, benefits and other options for treatment, the patient has consented to  Procedure(s) (LRB): ESOPHAGOGASTRODUODENOSCOPY (EGD) (N/A) as a surgical intervention .  The patient's history has been reviewed, patient examined, no change in status, stable for surgery.  I have reviewed the patient's chart and labs.  Questions were answered to the patient's satisfaction.    The recent H&P (dated *08/29/11**) was reviewed, the patient was examined and there is no change in the patients condition since that H&P was completed.   [name]  08/30/2011, 10:54 AM    [name]

## 2011-08-30 NOTE — Progress Notes (Signed)
Subjective: Feels ok, just waking up, couldn't sleep well last pm from restless leg syndrome, denies CP/N/V/Abd pain/SoB/Cough  Objective: Vital signs in last 24 hours: Temp:  [98.2 F (36.8 C)-99.7 F (37.6 C)] 98.8 F (37.1 C) (07/23 0936) Pulse Rate:  [58-80] 69  (07/23 0500) Resp:  [13-27] 16  (07/23 1140) BP: (108-169)/(62-90) 132/67 mmHg (07/23 1140) SpO2:  [90 %-100 %] 92 % (07/23 1140) Weight:  [89.359 kg (197 lb)] 89.359 kg (197 lb) (07/23 0936) Weight change:  Last BM Date: 08/29/11  Intake/Output from previous day: 07/22 0701 - 07/23 0700 In: 252.5 [Blood:252.5] Out: -      Physical Exam: General: Alert, awake, oriented x3, in no acute distress. HEENT: No bruits, no goiter. Heart: Regular rate and rhythm, without murmurs, rubs, gallops. Lungs: Clear to auscultation bilaterally. Abdomen: Soft, nontender, nondistended, positive bowel sounds. Extremities: No clubbing cyanosis or edema with positive pedal pulses. Neuro: Grossly intact, nonfocal.    Lab Results: Basic Metabolic Panel:  Basename 08/30/11 0510 08/29/11 1009  NA 131* 131*  K 4.0 4.3  CL 98 96  CO2 25 25  GLUCOSE 90 146*  BUN 7 12  CREATININE 0.62 0.63  CALCIUM 9.0 9.8  MG -- --  PHOS -- --   Liver Function Tests:  Basename 08/30/11 0510  AST 43*  ALT 5  ALKPHOS 48  BILITOT 0.6  PROT 5.9*  ALBUMIN 3.7   No results found for this basename: LIPASE:2,AMYLASE:2 in the last 72 hours No results found for this basename: AMMONIA:2 in the last 72 hours CBC:  Basename 08/30/11 0510 08/29/11 1832  WBC 7.4 5.7  NEUTROABS -- --  HGB 9.0* 8.8*  HCT 28.2* 27.6*  MCV 83.7 84.1  PLT 193 193   Cardiac Enzymes:  Basename 08/29/11 2353 08/29/11 1605  CKTOTAL 115 80  CKMB 4.0 3.7  CKMBINDEX -- --  TROPONINI <0.30 <0.30   BNP: No results found for this basename: PROBNP:3 in the last 72 hours D-Dimer: No results found for this basename: DDIMER:2 in the last 72 hours CBG:  Basename  08/30/11 0717 08/29/11 2129 08/29/11 1633  GLUCAP 106* 104* 103*   Hemoglobin A1C: No results found for this basename: HGBA1C in the last 72 hours Fasting Lipid Panel: No results found for this basename: CHOL,HDL,LDLCALC,TRIG,CHOLHDL,LDLDIRECT in the last 72 hours Thyroid Function Tests: No results found for this basename: TSH,T4TOTAL,FREET4,T3FREE,THYROIDAB in the last 72 hours Anemia Panel:  Basename 08/29/11 1605  VITAMINB12 272  FOLATE >20.0  FERRITIN 14*  TIBC 389  IRON 23*  RETICCTPCT 5.3*   Coagulation: No results found for this basename: LABPROT:2,INR:2 in the last 72 hours Urine Drug Screen: Drugs of Abuse  No results found for this basename: labopia, cocainscrnur, labbenz, amphetmu, thcu, labbarb    Alcohol Level: No results found for this basename: ETH:2 in the last 72 hours Urinalysis:  Basename 08/30/11 0814 08/29/11 1249  COLORURINE YELLOW YELLOW  LABSPEC 1.013 1.018  PHURINE 7.0 6.5  GLUCOSEU NEGATIVE NEGATIVE  HGBUR NEGATIVE NEGATIVE  BILIRUBINUR NEGATIVE NEGATIVE  KETONESUR NEGATIVE NEGATIVE  PROTEINUR NEGATIVE NEGATIVE  UROBILINOGEN 0.2 0.2  NITRITE NEGATIVE NEGATIVE  LEUKOCYTESUR NEGATIVE NEGATIVE    No results found for this or any previous visit (from the past 240 hour(s)).  Studies/Results: Dg Chest 2 View  08/29/2011  *RADIOLOGY REPORT*  Clinical Data: Syncope, shortness of breath and chest pain  CHEST - 2 VIEW  Comparison: CT PE protocol 08/14/2010, chest radiography 02/09/2009, 09/19/2008  Findings: Persistent low volumes with crowding  of the bronchovascular markings is noted.  Left lower lobe atelectasis or early airspace disease noted.  Heart size is upper limits of normal.  Evidence of median sternotomy and aortic valvuloplasty. No pleural effusion.  Mild kyphosis of the approximate thoracolumbar junction.  Stable mild compression deformity at this level.  IMPRESSION: Minimal left lower lobe patchy airspace opacity which could reflect  atelectasis but early pneumonia could have a similar appearance. If the patient's symptoms continue, consider PA and lateral chest radiographs obtained at full inspiration when the patient is clinically able.  Original Report Authenticated By: Harrel Lemon, M.D.    Medications: Scheduled Meds:   . sodium chloride   Intravenous STAT  . amitriptyline  25 mg Oral QHS  . bisacodyl  5 mg Oral Q6H  . carbidopa-levodopa  1 tablet Oral BID  . gabapentin  300 mg Oral BID  . insulin aspart  0-5 Units Subcutaneous QHS  . insulin aspart  0-9 Units Subcutaneous TID WC  . LORazepam  0.5 mg Oral Once  . metoprolol  100 mg Oral BID  . pantoprazole (PROTONIX) IV  40 mg Intravenous Q12H  . peg 3350 powder  1 kit Oral Once  . sodium chloride  3 mL Intravenous Q12H  . DISCONTD: ferrous gluconate  324 mg Oral Q lunch   Continuous Infusions:   . sodium chloride    . DISCONTD: sodium chloride     PRN Meds:.acetaminophen, acetaminophen, albuterol, dextromethorphan-guaiFENesin, docusate sodium, ondansetron (ZOFRAN) IV, ondansetron, DISCONTD: butamben-tetracaine-benzocaine, DISCONTD: fentaNYL, DISCONTD: glycopyrrolate, DISCONTD: midazolam, DISCONTD: ondansetron (ZOFRAN) IV  Assessment/Plan: 1. Coffee ground emesis/Suspected upper GI bleed with concomitant hemorrhoidal bleeding  For EGD this am  IV PPI twice a day  Transfused 1 unit of PRBC 7/22, type and screen, CBC every 12hours  NPO for endo now per [facility]   2. Syncope:  I suspect this is secondary to GI bleed, blood loss, possibly vagal response from 1  Continue to monitor on telemetry, EKG unchanged from prior,   2 sets of cardiac enzymes negative  3. Anemia secondary to 1, also has history of iron deficiency anemia on supplemental iron, transfused 1 unit  4. Abnormal chest x-ray: Atelectasis versus pneumonia, patient is completely asymptomatic without leukocytosis, will monitor for now and repeat 2 view film in a.m.   5. Hypertension:  I will continue his metoprolol with holding parameters, and for now hold his losartan, HCTZ   6. Diabetes mellitus type 2: Hold metformin, sliding scale insulin for now   7. DVT prophylaxis: SCDs   8. Code status: FULL CODE    LOS: 1 day   [facility] Pager: [phone] 08/30/2011, 1:44 PM

## 2011-08-30 NOTE — Progress Notes (Signed)
Endoscopy was negative for source for GI bleeding. Plan colonoscopy in a.m.

## 2011-08-31 ENCOUNTER — Inpatient Hospital Stay (HOSPITAL_COMMUNITY): Payer: Medicare Other

## 2011-08-31 ENCOUNTER — Encounter (HOSPITAL_COMMUNITY): Payer: Self-pay | Admitting: *Deleted

## 2011-08-31 ENCOUNTER — Encounter (HOSPITAL_COMMUNITY)
Admission: EM | Disposition: A | Discharge status: Home / Self Care | Payer: Self-pay | Source: Home / Self Care | Attending: Internal Medicine

## 2011-08-31 ENCOUNTER — Other Ambulatory Visit: Payer: Self-pay | Admitting: Gastroenterology

## 2011-08-31 DIAGNOSIS — R300 Dysuria: Secondary | ICD-10-CM

## 2011-08-31 DIAGNOSIS — K644 Residual hemorrhoidal skin tags: Secondary | ICD-10-CM

## 2011-08-31 DIAGNOSIS — G4700 Insomnia, unspecified: Secondary | ICD-10-CM

## 2011-08-31 DIAGNOSIS — D649 Anemia, unspecified: Secondary | ICD-10-CM

## 2011-08-31 HISTORY — PX: COLONOSCOPY: SHX5424

## 2011-08-31 LAB — GLUCOSE, CAPILLARY
Glucose-Capillary: 111 mg/dL — ABNORMAL HIGH (ref 70–99)
Glucose-Capillary: 84 mg/dL (ref 70–99)
Glucose-Capillary: 96 mg/dL (ref 70–99)

## 2011-08-31 LAB — CBC
Hemoglobin: 9.6 g/dL — ABNORMAL LOW (ref 13.0–17.0)
MCH: 27 pg (ref 26.0–34.0)
MCH: 26.2 pg (ref 26.0–34.0)
MCHC: 32.3 g/dL (ref 30.0–36.0)
MCHC: 31.3 g/dL (ref 30.0–36.0)
MCV: 83.5 fL (ref 78.0–100.0)
Platelets: 192 10*3/uL (ref 150–400)
RBC: 3.52 MIL/uL — ABNORMAL LOW (ref 4.22–5.81)

## 2011-08-31 SURGERY — COLONOSCOPY
Anesthesia: Moderate Sedation

## 2011-08-31 MED ORDER — MORPHINE SULFATE 2 MG/ML IJ SOLN
0.5000 mg | INTRAMUSCULAR | Status: AC | PRN
  Administered 2011-08-31 (×2): 0.5 mg via INTRAVENOUS
  Filled 2011-08-31 (×2): qty 1

## 2011-08-31 MED ORDER — MIDAZOLAM HCL 10 MG/2ML IJ SOLN
INTRAMUSCULAR | Status: AC
  Filled 2011-08-31: qty 4

## 2011-08-31 MED ORDER — FENTANYL CITRATE 0.05 MG/ML IJ SOLN
INTRAMUSCULAR | Status: AC
  Filled 2011-08-31: qty 4

## 2011-08-31 MED ORDER — ZOLPIDEM TARTRATE 5 MG PO TABS
5.0000 mg | ORAL_TABLET | Freq: Every evening | ORAL | Status: DC | PRN
  Administered 2011-09-01 (×2): 5 mg via ORAL
  Filled 2011-08-31 (×2): qty 1

## 2011-08-31 MED ORDER — LOSARTAN POTASSIUM 50 MG PO TABS: 100.0000 mg | ORAL_TABLET | Freq: Every day | ORAL | Status: DC

## 2011-08-31 MED ORDER — MIDAZOLAM HCL 5 MG/5ML IJ SOLN
INTRAMUSCULAR | Status: DC | PRN
  Administered 2011-08-31: 1 mg via INTRAVENOUS
  Administered 2011-08-31 (×2): 2 mg via INTRAVENOUS

## 2011-08-31 MED ORDER — TRAMADOL HCL 50 MG PO TABS: 50.0000 mg | ORAL_TABLET | Freq: Once | ORAL | Status: DC

## 2011-08-31 MED ORDER — FENTANYL CITRATE 0.05 MG/ML IJ SOLN
INTRAMUSCULAR | Status: DC | PRN
  Administered 2011-08-31: 15 ug via INTRAVENOUS
  Administered 2011-08-31 (×2): 25 ug via INTRAVENOUS

## 2011-08-31 MED ORDER — HYDROCORTISONE ACETATE 25 MG RE SUPP
25.0000 mg | Freq: Two times a day (BID) | RECTAL | Status: DC
  Administered 2011-09-01 – 2011-09-02 (×3): 25 mg via RECTAL
  Filled 2011-08-31 (×6): qty 1

## 2011-08-31 MED ORDER — DIPHENHYDRAMINE HCL 50 MG/ML IJ SOLN
INTRAMUSCULAR | Status: AC
  Filled 2011-08-31: qty 1

## 2011-08-31 MED ORDER — BISACODYL 10 MG RE SUPP
10.0000 mg | Freq: Once | RECTAL | Status: AC
  Administered 2011-08-31: 10 mg via RECTAL
  Filled 2011-08-31: qty 1

## 2011-08-31 MED ORDER — LOSARTAN POTASSIUM 50 MG PO TABS
100.0000 mg | ORAL_TABLET | Freq: Every day | ORAL | Status: DC
  Administered 2011-08-31 – 2011-09-02 (×3): 100 mg via ORAL
  Filled 2011-08-31 (×3): qty 2

## 2011-08-31 MED ORDER — LEVOFLOXACIN 750 MG PO TABS
750.0000 mg | ORAL_TABLET | Freq: Every day | ORAL | Status: DC
  Administered 2011-08-31 – 2011-09-01 (×2): 750 mg via ORAL
  Filled 2011-08-31 (×3): qty 1

## 2011-08-31 MED ORDER — HYOSCYAMINE SULFATE 0.125 MG SL SUBL
0.2500 mg | SUBLINGUAL_TABLET | SUBLINGUAL | Status: DC | PRN
  Administered 2011-08-31: 0.25 mg via SUBLINGUAL
  Filled 2011-08-31: qty 2

## 2011-08-31 MED ORDER — BUTALBITAL-APAP-CAFFEINE 50-325-40 MG PO TABS
1.0000 | ORAL_TABLET | Freq: Two times a day (BID) | ORAL | Status: DC | PRN
  Administered 2011-08-31 (×2): 1 via ORAL
  Filled 2011-08-31 (×2): qty 1

## 2011-08-31 NOTE — Progress Notes (Signed)
Colonoscopy demonstrated internal hemorrhoids. To date etiology for GI bleeding and anemia has not been determined. It is possible that he has a Dielafoy's ulcer which may be very difficult to see in the absence of active bleeding. A small bowel source is also a possibility.  Recommendations #1 and 2 new proton is #2 capsule endoscopy-this can be done as an outpatient

## 2011-08-31 NOTE — Progress Notes (Addendum)
Patient returned from colonoscopy with abdominal cramping and gaseous pain.  Abdomen distended with hyperactive bowel sounds.  Patient up in room walking.  [name] PA paged and notified, orders received for dulcolax suppository, hold diet until pain better, and to hold off on anusol supp for now.  Patient ambulated in the hallway with some relief and passing of flatus; still has pain.  Dulcolax suppository given and patient instructed to ambulate more.  Will continue to monitor.  [name]   Patient continues to complain of abdominal pain, now moved from right abdomen to the lower abdomen.  Abdomen distended and a little firm.  Still hyperactive bowel sounds.  Blase Mess PA made aware, orders received will continue to monitor.  [name]

## 2011-08-31 NOTE — Progress Notes (Signed)
Subjective: Patient relates abdominal gas, cramping after colonoscopy and abdominal distension.  He had small amount of blood tissue paper.  He relates some cough, no worse than usual.  He relates pain with urination , penes area, no drainage.  He would like pill to helping sleep.  Objective: Filed Vitals:   08/31/11 1050 08/31/11 1100 08/31/11 1148 08/31/11 1413  BP: 137/71 127/69 138/88 151/89  Pulse:   66 63  Temp:   97.9 F (36.6 C) 98.2 F (36.8 C)  TempSrc:    Oral  Resp: 15 16  20   Height:      Weight:      SpO2: 98% 98% 94% 95%   Weight change:    General: Alert, awake, oriented x3, in no acute distress.  HEENT: No bruits, no goiter.  Heart: Regular rate and rhythm, without murmurs, rubs, gallops.  Lungs: Crackles left side, bilateral air movement.  Abdomen: Soft, mild tender, obesed, nondistended, positive bowel sounds. No rigidity. Neuro: Grossly intact, nonfocal. Extremities; no edema.   Lab Results:  Basename 08/30/11 0510 08/29/11 1009  NA 131* 131*  K 4.0 4.3  CL 98 96  CO2 25 25  GLUCOSE 90 146*  BUN 7 12  CREATININE 0.62 0.63  CALCIUM 9.0 9.8  MG -- --  PHOS -- --    Basename 08/30/11 0510  AST 43*  ALT 5  ALKPHOS 48  BILITOT 0.6  PROT 5.9*  ALBUMIN 3.7    Basename 08/31/11 0749 08/30/11 1740  WBC 7.4 6.7  NEUTROABS -- --  HGB 9.5* 9.8*  HCT 29.4* 30.7*  MCV 83.5 84.6  PLT 192 196    Basename 08/29/11 2353 08/29/11 1605  CKTOTAL 115 80  CKMB 4.0 3.7  CKMBINDEX -- --  TROPONINI <0.30 <0.30    Basename 08/29/11 1605  VITAMINB12 272  FOLATE >20.0  FERRITIN 14*  TIBC 389  IRON 23*  RETICCTPCT 5.3*    Micro Results: No results found for this or any previous visit (from the past 240 hour(s)).  Studies/Results: Dg Chest 2 View  08/31/2011  *RADIOLOGY REPORT*  Clinical Data: Shortness of breath.  Previous aortic valve replacement.  CHEST - 2 VIEW  Comparison: None.  Findings: Cardiac and mediastinal silhouette appear  unchanged with overall cardiac enlargement and prior median sternotomy.  Aortic valve prosthesis noted centrally.  Slight increased markings in the right mid and lower lung zone have worsened compared with the prior film.  Early infiltrate not excluded.  No effusion or pneumothorax.  IMPRESSION: Slight increased markings right mid and lower lung zone have worsened.  Early pneumonia would be a consideration.  Original Report Authenticated By: Elsie Stain, M.D.    Medications: I have reviewed the patient's current medications.  1. Coffee ground emesis/ For EGD norma, no source of bleeding. IV PPI twice a day  Transfused 1 unit of PRBC 7/22, type and screen, CBC every 12hours  S/P colonoscopy hemorrhoids, no source of bleed. GI recommend capsule endoscopy outpatient. Patient with gas pain after colonoscopy, GI order X ray.  GI following.  2. Syncope:  I suspect this is secondary to GI bleed, blood loss, possibly vagal response from 1  Continue to monitor on telemetry, EKG unchanged from prior,  cardiac enzymes negative  3. Anemia secondary to 1, also has history of iron deficiency anemia on supplemental iron, transfused 1 unit  4. Abnormal chest x-ray: Atelectasis versus pneumonia, Worsening aeration. Incentive spirometry, Levaquin.   5. Hypertension: I will continue his metoprolol  with holding parameters, and for now hold his losartan, HCTZ  6. Diabetes mellitus type 2: Hold metformin, sliding scale insulin for now  7. DVT prophylaxis: SCDs  8-Dysuria: Start Levaquin. Needs to follow up with urology.  9-Mild hyponatremia: asymptomatic. Monitor.      LOS: 2 days   Jamesia Linnen M.D.  [facility] 08/31/2011, 3:34 PM

## 2011-08-31 NOTE — Interval H&P Note (Signed)
History and Physical Interval Note:  08/31/2011 10:16 AM  [name]  has presented today for surgery, with the diagnosis of anemia  The various methods of treatment have been discussed with the patient and family. After consideration of risks, benefits and other options for treatment, the patient has consented to  Procedure(s) (LRB): COLONOSCOPY (N/A) as a surgical intervention .  The patient's history has been reviewed, patient examined, no change in status, stable for surgery.  I have reviewed the patient's chart and labs.  Questions were answered to the patient's satisfaction.    The recent H&P (dated **08/29/11*) was reviewed, the patient was examined and there is no change in the patients condition since that H&P was completed.   [name]  08/31/2011, 10:16 AM    [name]

## 2011-08-31 NOTE — Op Note (Signed)
[facility] [address] [place], [place]  [address]  COLONOSCOPY PROCEDURE REPORT  PATIENT:  [name], [name]  MR#:  [id number] BIRTHDATE:  1945/04/04, 66 yrs. old  GENDER:  male ENDOSCOPIST:  Barbette Hair. Arlyce Dice, MD REF. BY: PROCEDURE DATE:  08/31/2011 PROCEDURE:  Diagnostic Colonoscopy ASA CLASS:  Class II INDICATIONS:  Gastrointestinal hemorrhage MEDICATIONS:   These medications were titrated to patient response per physician's verbal order, Fentanyl 65 mcg IV, Versed 6 mg IV  DESCRIPTION OF PROCEDURE:   After the risks benefits and alternatives of the procedure were thoroughly explained, informed consent was obtained.  Digital rectal exam was performed and revealed prolasped hemorrhoids.  grade 3 hemorrhoids The Pentax Colonoscope [id number] endoscope was introduced through the anus and advanced to the cecum, which was identified by both the appendix and ileocecal valve, limited by poor preparation.  There was a moderate amount of retained liquid brown stool.  The quality of the prep was Moviprep fair.  The instrument was then slowly withdrawn as the colon was fully examined. <<PROCEDUREIMAGES>>  FINDINGS:  Scattered diverticula were found in the transverse colon.  Internal Hemorrhoids were found (see image008).  This was otherwise a normal examination of the colon (see image006 and image007).   Retroflexed views in the rectum revealed no abnormalities.    The time to cecum =  minutes. The scope was then withdrawn in  1) 12.0  minutes from the cecum and the procedure completed. COMPLICATIONS:  None ENDOSCOPIC IMPRESSION: 1) Diverticula, scattered in the transverse colon 2) Internal hemorrhoids 3) Otherwise normal examination  Etiology for GI bleeding not determined by endoscopy or colonoscopy    RECOMMENDATIONS:1) Capsule endoscopy REPEAT EXAM:  10 years  ______________________________ Barbette Hair. Arlyce Dice, MD  CC:  n. eSIGNED:   Barbette Hair. Kaplan  at 08/31/2011 10:48 AM  Ashby Dawes, [phone]

## 2011-08-31 NOTE — OR Nursing (Signed)
Patient up to bathroom with assistance,  Passed gas and liquid stool without relief of pain.  Patient describes pain as cramping and gaseous.  Patient encouraged to walk and move around in room to help expelled flatus.  Encouraged to notify RN or MD if pain increases or isn't relieved in a few hours. Patient noticed small amount of blood on toilet paper.

## 2011-08-31 NOTE — Progress Notes (Signed)
Results of KUB called to Dr. Rhea Belton, new orders received.  Patient given Levsin per prn orders and states he is having some relief and starting to pass flatus.  Dr. Margretta Sidle also stated not to give Anusol suppository today and to start in the morning.  Information given to [employer].  Colman Cater

## 2011-09-01 ENCOUNTER — Encounter (HOSPITAL_COMMUNITY): Payer: Self-pay | Admitting: Gastroenterology

## 2011-09-01 LAB — CBC
MCH: 26.3 pg (ref 26.0–34.0)
MCV: 83.9 fL (ref 78.0–100.0)
Platelets: 210 10*3/uL (ref 150–400)
Platelets: 213 10*3/uL (ref 150–400)
RBC: 3.8 MIL/uL — ABNORMAL LOW (ref 4.22–5.81)
RDW: 15.6 % — ABNORMAL HIGH (ref 11.5–15.5)
WBC: 7.1 10*3/uL (ref 4.0–10.5)
WBC: 6.6 10*3/uL (ref 4.0–10.5)

## 2011-09-01 LAB — GLUCOSE, CAPILLARY: Glucose-Capillary: 102 mg/dL — ABNORMAL HIGH (ref 70–99)

## 2011-09-01 MED ORDER — PANTOPRAZOLE SODIUM 40 MG PO TBEC: 40.0000 mg | DELAYED_RELEASE_TABLET | Freq: Every day | ORAL | Status: DC

## 2011-09-01 MED ORDER — BISACODYL 10 MG RE SUPP
10.0000 mg | Freq: Once | RECTAL | Status: AC
  Administered 2011-09-01: 10 mg via RECTAL
  Filled 2011-09-01: qty 1

## 2011-09-01 NOTE — Progress Notes (Signed)
Subjective: Patient relates abdominal pain , gas better. He will try diet.  He was able to sleep well last night.  Still complaining of dysuria.  Objective: Filed Vitals:   08/31/11 1413 08/31/11 2159 08/31/11 2236 09/01/11 0607  BP: 151/89 176/99 157/91 135/86  Pulse: 63 67 70 73  Temp: 98.2 F (36.8 C) 98 F (36.7 C)  98.4 F (36.9 C)  TempSrc: Oral   Oral  Resp: 20 17  18   Height:      Weight:      SpO2: 95% 92%  94%   Weight change:    General: Alert, awake, oriented x3, in no acute distress.  HEENT: No bruits, no goiter.  Heart: Regular rate and rhythm, without murmurs, rubs, gallops.  Lungs: CTA, bilateral air movement.  Abdomen: Soft, mild tender, mild distended, positive bowel sounds.  Neuro: Grossly intact, nonfocal. Extremities; no edema.   Lab Results:  [facility] 08/30/11 0510  NA 131*  K 4.0  CL 98  CO2 25  GLUCOSE 90  BUN 7  CREATININE 0.62  CALCIUM 9.0  MG --  PHOS --    Basename 08/30/11 0510  AST 43*  ALT 5  ALKPHOS 48  BILITOT 0.6  PROT 5.9*  ALBUMIN 3.7    Basename 09/01/11 0819 08/31/11 1747  WBC 7.1 8.4  NEUTROABS -- --  HGB 9.9* 9.6*  HCT 31.8* 30.7*  MCV 83.9 83.9  PLT 210 205    Basename 08/29/11 2353 08/29/11 1605  CKTOTAL 115 80  CKMB 4.0 3.7  CKMBINDEX -- --  TROPONINI <0.30 <0.30    Basename 08/29/11 1605  VITAMINB12 272  FOLATE >20.0  FERRITIN 14*  TIBC 389  IRON 23*  RETICCTPCT 5.3*    Micro Results: No results found for this or any previous visit (from the past 240 hour(s)).  Studies/Results: Dg Chest 2 View  08/31/2011  *RADIOLOGY REPORT*  Clinical Data: Shortness of breath.  Previous aortic valve replacement.  CHEST - 2 VIEW  Comparison: None.  Findings: Cardiac and mediastinal silhouette appear unchanged with overall cardiac enlargement and prior median sternotomy.  Aortic valve prosthesis noted centrally.  Slight increased markings in the right mid and lower lung zone have worsened compared with the  prior film.  Early infiltrate not excluded.  No effusion or pneumothorax.  IMPRESSION: Slight increased markings right mid and lower lung zone have worsened.  Early pneumonia would be a consideration.  Original Report Authenticated By: Elsie Stain, M.D.   Dg Abd Acute W/chest  08/31/2011  *RADIOLOGY REPORT*  Clinical Data: 66 year old male with severe abdominal pain after colonoscopy.  Ileus or perforation.  ACUTE ABDOMEN SERIES (ABDOMEN 2 VIEW & CHEST 1 VIEW)  Comparison: 10/28/2007 and earlier.  Findings: Chronically low lung volumes, slightly improved since comparison.  Chronic elevation of the right hemidiaphragm.  No pneumoperitoneum identified.  No pneumothorax.  No pulmonary edema or definite acute pulmonary opacity.  Stable cardiac size and mediastinal contours.  Gas throughout the large bowel from the cecum to the rectum.  Gas in small bowel loops, but none are abnormally dilated.  No acute osseous abnormality identified.  Stable pelvic phleboliths.  IMPRESSION: 1.  Gas throughout nondilated loops of colon and small bowel. Favor ileus or resolving gas insufflation from colonoscopy. 2.  No free air. 3.  Chronically low lung volumes. No acute cardiopulmonary abnormality.  Original Report Authenticated By: Harley Hallmark, M.D.    Medications: I have reviewed the patient's current medications.  1. Coffee ground  emesis/  For EGD norma, no source of bleeding. IV PPI change to PO.  Transfused 1 unit of PRBC 7/22, type and screen, CBC every 12hours  S/P colonoscopy hemorrhoids, no source of bleed. GI recommend capsule endoscopy outpatient. Patient with gas pain after colonoscopy, KUB negative for perforation. Discharge tomorrow if patient tolerates diet and ileus resolved.   2. Syncope:  I suspect this is secondary to GI bleed, blood loss, possibly vagal response from 1  Continue to monitor on telemetry, EKG unchanged from prior, cardiac enzymes negative. Will discontinue telemetry.   3. Anemia  secondary to 1, also has history of iron deficiency anemia on supplemental iron, transfused 1 unit  4. Abnormal chest x-ray: Atelectasis versus pneumonia, Worsening aeration. Incentive spirometry, Levaquin day 2 to cover for possible infection.  5. Hypertension: I will continue his metoprolol,  losartan, Hold HCTZ  6. Diabetes mellitus type 2: Hold metformin, sliding scale insulin for now  7. DVT prophylaxis: SCDs  8-Dysuria: Start Levaquin. Needs to follow up with urology.  9-Mild hyponatremia: asymptomatic. Monitor.      LOS: 3 days   REGALADO,BELKYS M.D.  [facility] 09/01/2011, 10:09 AM

## 2011-09-01 NOTE — Progress Notes (Signed)
Pt c/o constipation two cups of prune juice given.

## 2011-09-01 NOTE — Progress Notes (Signed)
Utilization review completed.  

## 2011-09-01 NOTE — Progress Notes (Signed)
Abdominal pain significantly reduced. He remains concerned about hemorrhoidal bleeding.  Recommend: 1) ppi therapy 2) anusol HC supp 3) capsule endoscopy as outpatient 4) t/c band ligation of hemorrhoids if bleeding persists  OK for d/c in am if no further bleeding.

## 2011-09-01 NOTE — Progress Notes (Signed)
[facility] Daily Rounding Note 09/01/2011, 8:31 AM  SUBJECTIVE:       Still with some discomfort and bloating in abdomen.  Was worse yesterday after colonoscopy.  Minor blood noted from rectum. No nausea on clears. Passing a little bit of flatus  OBJECTIVE:         Vital signs in last 24 hours:    Temp:  [97.9 F (36.6 C)-98.4 F (36.9 C)] 98.4 F (36.9 C) (07/25 0607) Pulse Rate:  [63-73] 73  (07/25 0607) Resp:  [11-87] 18  (07/25 0607) BP: (127-176)/(68-100) 135/86 mmHg (07/25 0607) SpO2:  [92 %-99 %] 94 % (07/25 0607) Last BM Date: 09-30-11 General: looks chronically unwell, pale   Heart: RRR Chest: clear B Abdomen: soft, NT,  Tympanitic BS  Extremities:  Neuro/Psych:  Depressed, no anxiety, agitation, confusion  Lab Results:  Basename 09-30-11 1747 09/30/2011 0749 08/30/11 1740  WBC 8.4 7.4 6.7  HGB 9.6* 9.5* 9.8*  HCT 30.7* 29.4* 30.7*  PLT 205 192 196   BMET  Basename 08/30/11 0510 08/29/11 1009  NA 131* 131*  K 4.0 4.3  CL 98 96  CO2 25 25  GLUCOSE 90 146*  BUN 7 12  CREATININE 0.62 0.63  CALCIUM 9.0 9.8   LFT  Basename 08/30/11 0510  PROT 5.9*  ALBUMIN 3.7  AST 43*  ALT 5  ALKPHOS 48  BILITOT 0.6  BILIDIR --  IBILI --     Studies/Results: Dg Chest 2 View 09-30-2011  *RADIOLOGY REPORT*  Clinical Data: Shortness of breath.  Previous aortic valve replacement.  CHEST - 2 VIEW  Comparison: None.  Findings: Cardiac and mediastinal silhouette appear unchanged with overall cardiac enlargement and prior median sternotomy.  Aortic valve prosthesis noted centrally.  Slight increased markings in the right mid and lower lung zone have worsened compared with the prior film.  Early infiltrate not excluded.  No effusion or pneumothorax.  IMPRESSION: Slight increased markings right mid and lower lung zone have worsened.  Early pneumonia would be a consideration.  Original Report Authenticated By: Elsie Stain, M.D.   Dg Abd Acute W/chest 30-Sep-2011  *RADIOLOGY  REPORT*  Clinical Data: 66 year old male with severe abdominal pain after colonoscopy.  Ileus or perforation.  ACUTE ABDOMEN SERIES (ABDOMEN 2 VIEW & CHEST 1 VIEW)  Comparison: 10/28/2007 and earlier.  Findings: Chronically low lung volumes, slightly improved since comparison.  Chronic elevation of the right hemidiaphragm.  No pneumoperitoneum identified.  No pneumothorax.  No pulmonary edema or definite acute pulmonary opacity.  Stable cardiac size and mediastinal contours.  Gas throughout the large bowel from the cecum to the rectum.  Gas in small bowel loops, but none are abnormally dilated.  No acute osseous abnormality identified.  Stable pelvic phleboliths.  IMPRESSION: 1.  Gas throughout nondilated loops of colon and small bowel. Favor ileus or resolving gas insufflation from colonoscopy. 2.  No free air. 3.  Chronically low lung volumes. No acute cardiopulmonary abnormality.  Original Report Authenticated By: Harley Hallmark, M.D.    ASSESMENT: *  Anemia, dark emesis.  Stool hemoccult status not established.  HH stable.  EGD 7/23:  Normal Colonoscopy Sep 30, 2022:  Scattered tics, int rrhoids, no lesion to explain the anemia. Prep was poor.  *  Abdominal pain post colonoscopy.  X ray shows ileus, no evidence perforation.  *  Pneumonia *  Chronic constipation   PLAN: *  Outpatient capsule endo *  Dulcolax PR, ambulation.  Will allow full liquids  now.    LOS: 3 days   [name]  09/01/2011, 8:31 AM Pager: [phone]

## 2011-09-02 LAB — CBC
Hemoglobin: 10.4 g/dL — ABNORMAL LOW (ref 13.0–17.0)
MCH: 26.9 pg (ref 26.0–34.0)
MCV: 83.7 fL (ref 78.0–100.0)
Platelets: 199 10*3/uL (ref 150–400)
RBC: 3.86 MIL/uL — ABNORMAL LOW (ref 4.22–5.81)

## 2011-09-02 LAB — GLUCOSE, CAPILLARY: Glucose-Capillary: 98 mg/dL (ref 70–99)

## 2011-09-02 MED ORDER — BISACODYL 10 MG RE SUPP: 10.0000 mg | RECTAL | Status: AC | PRN

## 2011-09-02 MED ORDER — BISACODYL 10 MG RE SUPP
10.0000 mg | Freq: Once | RECTAL | Status: DC
  Filled 2011-09-02: qty 1

## 2011-09-02 MED ORDER — LEVOFLOXACIN 750 MG PO TABS: 750.0000 mg | ORAL_TABLET | Freq: Every day | ORAL | Status: DC

## 2011-09-02 MED ORDER — HYDROCORTISONE ACETATE 25 MG RE SUPP: 25.0000 mg | Freq: Two times a day (BID) | RECTAL | Status: AC

## 2011-09-02 NOTE — Discharge Summary (Signed)
Physician Discharge Summary  [name] ZOX:[id number] DOB: 1945/06/16 DOA: 08/29/2011  PCP: No primary provider on file.  Admit date: 08/29/2011 Discharge date: 09/02/2011  Discharge Diagnoses:   HYPERLIPIDEMIA-MIXED  OBSTRUCTIVE SLEEP APNEA  HYPERTENSION  HIATAL HERNIA  OSTEOARTHRITIS  BENIGN PROSTATIC HYPERTROPHY, HX OF  AORTIC VALVE REPLACEMENT, HX OF  Hyponatremia  Anemia  Coffee ground emesis  Syncope   Discharge Condition: Stable.  Disposition: needs to follow up with GI for capsule endoscopy.   Diet: Bland, hearth healthy.   History of present illness:  Mr. Holcomb is a 66 year old male with history of aortic valve replacement, hypertension, external hemorrhoids, BPH, osteoarthritis was taking his garbage out today, noticed that it is starting to feel very weak and came back into the house and doesn't recall anything else, he then found himself awake on the floor, went up to the phone and called his wife.  This was followed by 2 episodes of vomiting, he noted that his vomitus contained brownish material.  In addition he also noticed that his blood pressure had been running on the lower side for the last couple of days, had a week upper abdominal discomfort on Saturday which he associated with his IBS.  He denies any melena or hematochezia.  He reports spontaneous bleeding from his hemorrhoids intermittently for the last 2 weeks.  Upon evaluation the emergency room was noted to have hemoglobin of 8.9 which is a drop of 3 g from 2 weeks prior.  He reports using only a couple of Aleve over the last 2 weeks for her headaches, denies any chest pain or palpitations over the last 2 days.  He also reports awake burning discomfort going up his chest earlier today which resolved.  Last EGD and colonoscopy per patient report in the fall of 2012, reportedly unremarkable, this was done at the [facility] Course:  1. Coffee ground emesis Patient had  EGD that was normal, no  source of bleeding. IV PPI change to PO. He received  1 unit of PRBC 7/22, type and screen, S/P colonoscopy hemorrhoids, no source of bleed. GI recommend capsule endoscopy outpatient. Patient with gas pain after colonoscopy, KUB negative for perforation. Discharge today if patient tolerates diet and ileus resolved.  2. Syncope:  I suspect this is secondary to GI bleed, blood loss, possibly vagal response from 1  Continue to monitor on telemetry, EKG unchanged from prior, cardiac enzymes negative. Will discontinue telemetry.  3. Anemia secondary to 1, also has history of iron deficiency anemia on supplemental iron, transfused 1 unit  4. Abnormal chest x-ray: Atelectasis versus pneumonia, Worsening aeration. Incentive spirometry, Levaquin day 3 to cover for possible infection.  5. Hypertension: I will continue his metoprolol, losartan, Hold HCTZ  6. Diabetes mellitus type 2: Hold metformin, sliding scale insulin for now  7. DVT prophylaxis: SCDs  8-Dysuria: Start Levaquin. Needs to follow up with urology.  9-Mild hyponatremia: asymptomatic. Monitor.   Discharge today if ok with GI.    Discharge Exam: Filed Vitals:   09/02/11 0513  BP: 130/85  Pulse: 66  Temp: 98.3 F (36.8 C)  Resp: 18   Filed Vitals:   09/01/11 1338 09/01/11 2036 09/01/11 2204 09/02/11 0513  BP: 146/88 156/92 150/83 130/85  Pulse: 65 65 66 66  Temp: 98.4 F (36.9 C) 98.1 F (36.7 C)  98.3 F (36.8 C)  TempSrc: Oral Oral  Oral  Resp: 20 18  18   Height:      Weight:  SpO2: 97% 98%  95%   General: No distress. Cardiovascular: S1,S2 RRR Respiratory: CTA Abdomen: soft, no rigidity obese, mild right side tender.  Discharge Instructions  Discharge Orders    Future Appointments: Provider: Department: Dept Phone: Center:   09/15/2011 9:00 AM Iva Boop, MD [facility] [place] Office [phone] [facility]     Future Orders Please Complete By Expires   Diet - low sodium heart healthy      Increase activity slowly         Medication List  As of 09/02/2011 10:11 AM   STOP taking these medications         amoxicillin 500 MG capsule      aspirin 81 MG tablet      cloNIDine 0.2 MG tablet         TAKE these medications         amitriptyline 25 MG tablet   Commonly known as: ELAVIL   Take 25 mg by mouth at bedtime.      bisacodyl 10 MG suppository   Commonly known as: DULCOLAX   Place 1 suppository (10 mg total) rectally as needed for constipation.      butalbital-acetaminophen-caffeine 50-325-40 MG per tablet   Commonly known as: FIORICET, ESGIC   Take 1 tablet by mouth 2 (two) times daily.      carbidopa-levodopa 25-100 MG per tablet   Commonly known as: SINEMET IR   Take 1 tablet by mouth 2 (two) times daily.      carboxymethylcellulose 0.5 % Soln   Commonly known as: REFRESH PLUS   Place 1 drop into both eyes at bedtime.      cholecalciferol 1000 UNITS tablet   Commonly known as: VITAMIN D   Take 2,000 Units by mouth daily.      dextromethorphan-guaiFENesin 10-100 MG/5ML liquid   Commonly known as: ROBITUSSIN-DM   Take 5 mLs by mouth every 4 (four) hours as needed.      diphenhydrAMINE 50 MG capsule   Commonly known as: BENADRYL   Take 50 mg by mouth every 6 (six) hours as needed. allergies      docusate sodium 100 MG capsule   Commonly known as: COLACE   Take 100 mg by mouth as needed. constipation      ferrous gluconate 324 MG tablet   Commonly known as: FERGON   Take 324 mg by mouth daily with lunch.      finasteride 5 MG tablet   Commonly known as: PROSCAR   Take 5 mg by mouth daily.      fish oil-omega-3 fatty acids 1000 MG capsule   Take 1 g by mouth daily.      gabapentin 300 MG capsule   Commonly known as: NEURONTIN   Take 300 mg by mouth 2 (two) times daily.      hydrochlorothiazide 25 MG tablet   Commonly known as: HYDRODIURIL   Take 12.5 mg by mouth daily.      hydrocortisone 25 MG suppository   Commonly known as: ANUSOL-HC   Place 1 suppository  (25 mg total) rectally 2 (two) times daily.      levofloxacin 750 MG tablet   Commonly known as: LEVAQUIN   Take 1 tablet (750 mg total) by mouth daily at 6 PM.      losartan 100 MG tablet   Commonly known as: COZAAR   Take 100 mg by mouth daily.      metFORMIN 500 MG tablet   Commonly known as:  GLUCOPHAGE   Take 500 mg by mouth 2 (two) times daily with a meal.      metoprolol 100 MG tablet   Commonly known as: LOPRESSOR   Take 100 mg by mouth 2 (two) times daily.      pantoprazole 40 MG tablet   Commonly known as: PROTONIX   Take 40 mg by mouth daily.      PROAIR HFA 108 (90 BASE) MCG/ACT inhaler   Generic drug: albuterol   Inhale 2 puffs into the lungs every 4 (four) hours as needed.              The results of significant diagnostics from this hospitalization (including imaging, microbiology, ancillary and laboratory) are listed below for reference.    Significant Diagnostic Studies: Dg Chest 2 View  08/31/2011  *RADIOLOGY REPORT*  Clinical Data: Shortness of breath.  Previous aortic valve replacement.  CHEST - 2 VIEW  Comparison: None.  Findings: Cardiac and mediastinal silhouette appear unchanged with overall cardiac enlargement and prior median sternotomy.  Aortic valve prosthesis noted centrally.  Slight increased markings in the right mid and lower lung zone have worsened compared with the prior film.  Early infiltrate not excluded.  No effusion or pneumothorax.  IMPRESSION: Slight increased markings right mid and lower lung zone have worsened.  Early pneumonia would be a consideration.  Original Report Authenticated By: Elsie Stain, M.D.   Dg Chest 2 View  08/29/2011  *RADIOLOGY REPORT*  Clinical Data: Syncope, shortness of breath and chest pain  CHEST - 2 VIEW  Comparison: CT PE protocol 08/14/2010, chest radiography 02/09/2009, 09/19/2008  Findings: Persistent low volumes with crowding of the bronchovascular markings is noted.  Left lower lobe atelectasis or  early airspace disease noted.  Heart size is upper limits of normal.  Evidence of median sternotomy and aortic valvuloplasty. No pleural effusion.  Mild kyphosis of the approximate thoracolumbar junction.  Stable mild compression deformity at this level.  IMPRESSION: Minimal left lower lobe patchy airspace opacity which could reflect atelectasis but early pneumonia could have a similar appearance. If the patient's symptoms continue, consider PA and lateral chest radiographs obtained at full inspiration when the patient is clinically able.  Original Report Authenticated By: Harrel Lemon, M.D.   Dg Abd Acute W/chest  08/31/2011  *RADIOLOGY REPORT*  Clinical Data: 66 year old male with severe abdominal pain after colonoscopy.  Ileus or perforation.  ACUTE ABDOMEN SERIES (ABDOMEN 2 VIEW & CHEST 1 VIEW)  Comparison: 10/28/2007 and earlier.  Findings: Chronically low lung volumes, slightly improved since comparison.  Chronic elevation of the right hemidiaphragm.  No pneumoperitoneum identified.  No pneumothorax.  No pulmonary edema or definite acute pulmonary opacity.  Stable cardiac size and mediastinal contours.  Gas throughout the large bowel from the cecum to the rectum.  Gas in small bowel loops, but none are abnormally dilated.  No acute osseous abnormality identified.  Stable pelvic phleboliths.  IMPRESSION: 1.  Gas throughout nondilated loops of colon and small bowel. Favor ileus or resolving gas insufflation from colonoscopy. 2.  No free air. 3.  Chronically low lung volumes. No acute cardiopulmonary abnormality.  Original Report Authenticated By: Harley Hallmark, M.D.    Microbiology: No results found for this or any previous visit (from the past 240 hour(s)).   Labs: Basic Metabolic Panel:  Lab 08/30/11 1610 08/29/11 1009  NA 131* 131*  K 4.0 4.3  CL 98 96  CO2 25 25  GLUCOSE 90 146*  BUN 7  12  CREATININE 0.62 0.63  CALCIUM 9.0 9.8  MG -- --  PHOS -- --   Liver Function Tests:  Lab  08/30/11 0510  AST 43*  ALT 5  ALKPHOS 48  BILITOT 0.6  PROT 5.9*  ALBUMIN 3.7   CBC:  Lab 09/02/11 0852 09/01/11 1800 09/01/11 0819 08/31/11 1747 08/31/11 0749  WBC 6.0 6.6 7.1 8.4 7.4  NEUTROABS -- -- -- -- --  HGB 10.4* 10.0* 9.9* 9.6* 9.5*  HCT 32.3* 31.6* 31.8* 30.7* 29.4*  MCV 83.7 83.2 83.9 83.9 83.5  PLT 199 213 210 205 192   Cardiac Enzymes:  Lab 08/29/11 2353 08/29/11 1605  CKTOTAL 115 80  CKMB 4.0 3.7  CKMBINDEX -- --  TROPONINI <0.30 <0.30   BNP: No components found with this basename: POCBNP:5 CBG:  Lab 09/02/11 0738 09/01/11 2031 09/01/11 1648 09/01/11 1142 09/01/11 0846  GLUCAP 98 77 117* 98 102*    Time coordinating discharge: 30 minutes  Signed:  Anas Reister  [facility] 09/02/2011, 10:11 AM

## 2011-09-02 NOTE — Progress Notes (Signed)
Pt has GI rov for Aug 8th 9 AM.  With Dr Leone Payor. At that time he can be reassessed and, if indicated, set up for capsule endoscopy. Note that pt had made this appt on his own earlier this month for eval of abd pain. He is discharging home today.  GI does not need to round on pt before discharge.   Jennye Moccasin PA-C

## 2011-09-04 ENCOUNTER — Emergency Department (HOSPITAL_COMMUNITY)
Admission: EM | Admit: 2011-09-04 | Discharge: 2011-09-04 | Disposition: A | Discharge status: Home / Self Care | Payer: Medicare Other | Attending: Emergency Medicine | Admitting: Emergency Medicine

## 2011-09-04 ENCOUNTER — Emergency Department (HOSPITAL_COMMUNITY): Payer: Medicare Other

## 2011-09-04 ENCOUNTER — Encounter (HOSPITAL_COMMUNITY): Payer: Self-pay | Admitting: Physical Medicine and Rehabilitation

## 2011-09-04 DIAGNOSIS — E785 Hyperlipidemia, unspecified: Secondary | ICD-10-CM | POA: Insufficient documentation

## 2011-09-04 DIAGNOSIS — N489 Disorder of penis, unspecified: Secondary | ICD-10-CM | POA: Diagnosis not present

## 2011-09-04 DIAGNOSIS — E119 Type 2 diabetes mellitus without complications: Secondary | ICD-10-CM | POA: Diagnosis not present

## 2011-09-04 DIAGNOSIS — Z87891 Personal history of nicotine dependence: Secondary | ICD-10-CM | POA: Insufficient documentation

## 2011-09-04 DIAGNOSIS — N508 Other specified disorders of male genital organs: Secondary | ICD-10-CM | POA: Diagnosis not present

## 2011-09-04 DIAGNOSIS — Z79899 Other long term (current) drug therapy: Secondary | ICD-10-CM | POA: Diagnosis not present

## 2011-09-04 DIAGNOSIS — I861 Scrotal varices: Secondary | ICD-10-CM | POA: Diagnosis not present

## 2011-09-04 DIAGNOSIS — K219 Gastro-esophageal reflux disease without esophagitis: Secondary | ICD-10-CM | POA: Diagnosis not present

## 2011-09-04 DIAGNOSIS — R1084 Generalized abdominal pain: Secondary | ICD-10-CM | POA: Diagnosis not present

## 2011-09-04 DIAGNOSIS — G4730 Sleep apnea, unspecified: Secondary | ICD-10-CM | POA: Diagnosis not present

## 2011-09-04 DIAGNOSIS — N419 Inflammatory disease of prostate, unspecified: Secondary | ICD-10-CM

## 2011-09-04 DIAGNOSIS — I10 Essential (primary) hypertension: Secondary | ICD-10-CM | POA: Diagnosis not present

## 2011-09-04 DIAGNOSIS — N433 Hydrocele, unspecified: Secondary | ICD-10-CM | POA: Diagnosis not present

## 2011-09-04 DIAGNOSIS — R300 Dysuria: Secondary | ICD-10-CM | POA: Diagnosis not present

## 2011-09-04 LAB — URINALYSIS, ROUTINE W REFLEX MICROSCOPIC
Ketones, ur: NEGATIVE mg/dL
Leukocytes, UA: NEGATIVE
Nitrite: NEGATIVE
Protein, ur: 30 mg/dL — AB

## 2011-09-04 LAB — BASIC METABOLIC PANEL
CO2: 27 mEq/L (ref 19–32)
Calcium: 9.5 mg/dL (ref 8.4–10.5)
Chloride: 96 mEq/L (ref 96–112)
Glucose, Bld: 116 mg/dL — ABNORMAL HIGH (ref 70–99)
Sodium: 132 mEq/L — ABNORMAL LOW (ref 135–145)

## 2011-09-04 LAB — CBC WITH DIFFERENTIAL/PLATELET
Eosinophils Relative: 2 % (ref 0–5)
HCT: 30.1 % — ABNORMAL LOW (ref 39.0–52.0)
Lymphocytes Relative: 17 % (ref 12–46)
Lymphs Abs: 0.8 10*3/uL (ref 0.7–4.0)
MCV: 83.4 fL (ref 78.0–100.0)
Neutro Abs: 3.6 10*3/uL (ref 1.7–7.7)
Platelets: 193 10*3/uL (ref 150–400)
RBC: 3.61 MIL/uL — ABNORMAL LOW (ref 4.22–5.81)
WBC: 5 10*3/uL (ref 4.0–10.5)

## 2011-09-04 LAB — URINE MICROSCOPIC-ADD ON

## 2011-09-04 MED ORDER — CIPROFLOXACIN HCL 500 MG PO TABS: 500.0000 mg | ORAL_TABLET | Freq: Two times a day (BID) | ORAL | Status: AC

## 2011-09-04 MED ORDER — HYDROCODONE-ACETAMINOPHEN 5-325 MG PO TABS: 1.0000 | ORAL_TABLET | ORAL | Status: AC | PRN

## 2011-09-04 MED ORDER — MORPHINE SULFATE 4 MG/ML IJ SOLN
4.0000 mg | Freq: Once | INTRAMUSCULAR | Status: AC
  Administered 2011-09-04: 4 mg via INTRAVENOUS
  Filled 2011-09-04: qty 1

## 2011-09-04 NOTE — ED Notes (Signed)
Pt presents to department for evaluation of penile pain and scrotum swelling. States he was released from hospital on Friday and symptoms started that evening. Reports scrotum swelling, penile pain and discomfort with urination. Pt states "I feel like I have to strain myself to urinate." denies hematuria. 8/10 pain at the time, states "It hurts to sit down."

## 2011-09-04 NOTE — ED Notes (Signed)
Pt states he feels much better. 3/10 pain at the time.

## 2011-09-04 NOTE — ED Notes (Signed)
Pt remains in ultrasound at the time. Will medicate when he returns. Wife waiting in room.

## 2011-09-04 NOTE — ED Notes (Signed)
Pt discharged. Encouraged to follow up with [facility]. Had no further questions at the time.

## 2011-09-04 NOTE — ED Provider Notes (Signed)
History     CSN: [id number]  Arrival date & time 09/04/11  0825   First MD Initiated Contact with Patient 09/04/11 [phone]      Chief Complaint  Patient presents with  . Groin Swelling  . Penis Pain    (Consider location/radiation/quality/duration/timing/severity/associated sxs/prior treatment) HPI Comments: Patient reports he has been having pain in his penis and both testicles for 3 days, also having pain with urination and difficulty starting flow.  Has some suprapubic pain.  Denies fever, penile discharge, hematuria.  Pain is also worse with palpation.   Pt was recently admitted to the hospital for syncope, anemia, GI bleeding.  In the hospital he developed an ileus following his colonoscopy but has been tolerating PO and passing flatus since the hospital. Last BM 4 days ago. Though he had not had a bowel movement, he states it is normal for him to only have a bowel movement 1-2 per week.    Patient is a 66 y.o. male presenting with penile pain. The history is provided by the patient and medical records.  Penis Pain Associated symptoms include abdominal pain. Pertinent negatives include no chills, coughing, fever, nausea or vomiting.    Past Medical History  Diagnosis Date  . HTN (hypertension)   . GERD (gastroesophageal reflux disease)     bravo pH study 2008  . Hyperlipidemia   . External hemorrhoids   . Insomnia   . Allergic rhinitis   . OA (osteoarthritis)   . Hiatal hernia   . BPH (benign prostatic hypertrophy)   . Chronic cough   . Anemia 2009  . Diverticulosis of colon     on colonoscopy 2008  . Heart murmur   . Shortness of breath   . OSA (obstructive sleep apnea)     USES CPAP AS NEEDED  . Diabetes mellitus     type 2  . Headache     Past Surgical History  Procedure Date  . Aortic valve replacement   . Nasal septoplasty w/ turbinoplasty   . Hernia repair   . Right knee arthroscopy   . Bunionectomy   . Cataract extraction 2009    &   2012   BILATERAL  . Esophagogastroduodenoscopy 08/30/2011    Procedure: ESOPHAGOGASTRODUODENOSCOPY (EGD);  Surgeon: Louis Meckel, MD;  Location: [facility] ENDOSCOPY;  Service: Endoscopy;  Laterality: N/A;  Rm 3005   . Colonoscopy 08/31/2011    Procedure: COLONOSCOPY;  Surgeon: Louis Meckel, MD;  Location: [facility] ENDOSCOPY;  Service: Endoscopy;  Laterality: N/A;    Family History  Problem Relation Age of Onset  . Coronary artery disease    . Hyperlipidemia    . Hypertension      History  Substance Use Topics  . Smoking status: Former Smoker    Quit date: 09/30/1971  . Smokeless tobacco: Not on file  . Alcohol Use: No      Review of Systems  Constitutional: Negative for fever and chills.  Respiratory: Negative for cough and shortness of breath.   Gastrointestinal: Positive for abdominal pain. Negative for nausea and vomiting.  Genitourinary: Positive for dysuria, scrotal swelling, penile pain and testicular pain. Negative for urgency, frequency and discharge.  All other systems reviewed and are negative.    Allergies  Codeine; Codeine phosphate; and Dutasteride  Home Medications   Current Outpatient Rx  Name Route Sig Dispense Refill  . ALBUTEROL SULFATE HFA 108 (90 BASE) MCG/ACT IN AERS Inhalation Inhale 2 puffs into the lungs every 4 (four) hours  as needed.      Marland Kitchen AMITRIPTYLINE HCL 25 MG PO TABS Oral Take 25 mg by mouth at bedtime.    Marland Kitchen BUTALBITAL-APAP-CAFFEINE 50-325-40 MG PO TABS Oral Take 1 tablet by mouth 2 (two) times daily.    Marland Kitchen CARBIDOPA-LEVODOPA 25-100 MG PO TABS Oral Take 1 tablet by mouth 2 (two) times daily.      Marland Kitchen CARBOXYMETHYLCELLULOSE SODIUM 0.5 % OP SOLN Both Eyes Place 1 drop into both eyes at bedtime.      Marland Kitchen VITAMIN D 1000 UNITS PO TABS Oral Take 2,000 Units by mouth daily.      Marland Kitchen DOCUSATE SODIUM 100 MG PO CAPS Oral Take 100 mg by mouth as needed. constipation    . FERROUS GLUCONATE 324 (38 FE) MG PO TABS Oral Take 324 mg by mouth daily with lunch.    Marland Kitchen FINASTERIDE  5 MG PO TABS Oral Take 5 mg by mouth daily.     . OMEGA-3 FATTY ACIDS 1000 MG PO CAPS Oral Take 1 g by mouth daily.      Marland Kitchen GABAPENTIN 300 MG PO CAPS Oral Take 300 mg by mouth 2 (two) times daily.      Marland Kitchen HYDROCHLOROTHIAZIDE 25 MG PO TABS Oral Take 12.5 mg by mouth daily.      Marland Kitchen HYDROCORTISONE ACETATE 25 MG RE SUPP Rectal Place 1 suppository (25 mg total) rectally 2 (two) times daily. 6 suppository 0  . LEVOFLOXACIN 750 MG PO TABS Oral Take 1 tablet (750 mg total) by mouth daily at 6 PM. 5 tablet 0  . LOSARTAN POTASSIUM 100 MG PO TABS Oral Take 100 mg by mouth daily.      Marland Kitchen METFORMIN HCL 500 MG PO TABS Oral Take 500 mg by mouth 2 (two) times daily with a meal.      . METOPROLOL TARTRATE 100 MG PO TABS Oral Take 100 mg by mouth 2 (two) times daily.    Marland Kitchen PANTOPRAZOLE SODIUM 40 MG PO TBEC Oral Take 40 mg by mouth daily.    Marland Kitchen BISACODYL 10 MG RE SUPP Rectal Place 1 suppository (10 mg total) rectally as needed for constipation. 12 suppository 0  . DEXTROMETHORPHAN-GUAIFENESIN 10-100 MG/5ML PO LIQD Oral Take 5 mLs by mouth every 4 (four) hours as needed.      Marland Kitchen DIPHENHYDRAMINE HCL 50 MG PO CAPS Oral Take 50 mg by mouth every 6 (six) hours as needed. allergies      BP 151/88  Pulse 72  Temp 97.9 F (36.6 C) (Oral)  Resp 16  SpO2 98%  Physical Exam  Nursing note and vitals reviewed. Constitutional: He appears well-developed and well-nourished. No distress.  Cardiovascular: Normal rate and regular rhythm.   Pulmonary/Chest: Effort normal and breath sounds normal. No respiratory distress. He has no wheezes. He has no rales.  Abdominal: Soft. He exhibits no distension and no mass. There is tenderness. There is no rigidity, no rebound and no guarding. Hernia confirmed negative in the right inguinal area and confirmed negative in the left inguinal area.       Diffuse lower abdominal tenderness  Genitourinary: Rectal exam shows external hemorrhoid, internal hemorrhoid and tenderness. Rectal exam shows no  mass and anal tone normal. Right testis shows tenderness. Right testis shows no mass and no swelling. Left testis shows tenderness. Left testis shows no mass and no swelling. Uncircumcised. No phimosis, paraphimosis or penile tenderness. No discharge found.  Lymphadenopathy:       Right: No inguinal adenopathy present.  Left: No inguinal adenopathy present.  Skin: He is not diaphoretic.    ED Course  Procedures (including critical care time)  Labs Reviewed  URINALYSIS, ROUTINE W REFLEX MICROSCOPIC - Abnormal; Notable for the following:    Protein, ur 30 (*)     All other components within normal limits  CBC WITH DIFFERENTIAL - Abnormal; Notable for the following:    RBC 3.61 (*)     Hemoglobin 9.7 (*)     HCT 30.1 (*)     All other components within normal limits  BASIC METABOLIC PANEL - Abnormal; Notable for the following:    Sodium 132 (*)     Glucose, Bld 116 (*)     BUN 5 (*)     All other components within normal limits  URINE MICROSCOPIC-ADD ON  URINE CULTURE   US Scrotum  09/04/2011  *RADIOLOGY REPORT*  Clinical Data:  66 year old male with testicular pain and swelling.  SCROTAL ULTRASOUND DOPPLER ULTRASOUND OF THE TESTICLES  Technique: Complete ultrasound examination of the testicles, epididymis, and other scrotal structures was performed.  Color and spectral Doppler ultrasound were also utilized to evaluate blood flow to the testicles.  Comparison:  None  Findings:  Right testis:  The right testicle is normal in appearance and size. No focal masses are identified.  Normal color Doppler flow and arterial/venous waveforms are noted.  Left testis:  The left testicle is normal in appearance and size. No focal masses are identified.  Normal color Doppler flow and arterial/venous waveforms are noted.  Right epididymis:  Unremarkable except for a 4 mm epididymal cyst/spermatocele.  Left epididymis:  Unremarkable except for 3 small epididymal cysts/spermatoceles.  Hydrocele:  Small  to moderate bilateral hydroceles are noted.  Varicocele:  Present, right greater than left.  Pulsed Doppler interrogation of both testes demonstrates low resistance flow bilaterally.  IMPRESSION: Normal testicles bilaterally.  No evidence of testicular torsion or mass.  Small to moderate bilateral hydroceles.  Bilateral varicoceles.  Original Report Authenticated By: Rosendo Gros, M.D.   [place] Art/ven Flow Abd Pelv Doppler  09/04/2011  *RADIOLOGY REPORT*  Clinical Data:  66 year old male with testicular pain and swelling.  SCROTAL ULTRASOUND DOPPLER ULTRASOUND OF THE TESTICLES  Technique: Complete ultrasound examination of the testicles, epididymis, and other scrotal structures was performed.  Color and spectral Doppler ultrasound were also utilized to evaluate blood flow to the testicles.  Comparison:  None  Findings:  Right testis:  The right testicle is normal in appearance and size. No focal masses are identified.  Normal color Doppler flow and arterial/venous waveforms are noted.  Left testis:  The left testicle is normal in appearance and size. No focal masses are identified.  Normal color Doppler flow and arterial/venous waveforms are noted.  Right epididymis:  Unremarkable except for a 4 mm epididymal cyst/spermatocele.  Left epididymis:  Unremarkable except for 3 small epididymal cysts/spermatoceles.  Hydrocele:  Small to moderate bilateral hydroceles are noted.  Varicocele:  Present, right greater than left.  Pulsed Doppler interrogation of both testes demonstrates low resistance flow bilaterally.  IMPRESSION: Normal testicles bilaterally.  No evidence of testicular torsion or mass.  Small to moderate bilateral hydroceles.  Bilateral varicoceles.  Original Report Authenticated By: Rosendo Gros, M.D.    Discussed patient with Dr Lorenso Courier.   12:01 PM Discussed exam and results with Dr Lorenso Courier.  Plan is to treat for prostatitis.  F/U with urology.    1. Prostatitis       MDM  Pt with dysuria,  penile and testicular pain, rectal tenderness.  Pt is afebrile with normal WBC count.  UA is unremarkable, sent for culture.  US shows hydroceles and varicoceles, cysts, no evidence of infection or torsion.  Given rectal tenderness and symptoms, will treat patient for prostatitis with close urology follow up.  Pt has seen Dr Patsi Sears in the past.   Results and plan discussed with patient by Dr Lorenso Courier.  Return precautions given.  Pt verbalizes understanding and agrees with plan.          [place], [place] 09/04/11 1600

## 2011-09-04 NOTE — ED Notes (Signed)
Pt returned to exam room. Medicated for scrotum and penile pain, rating 8/10. Updated on plan of care and waiting for ultrasound results. Wife remains at bedside. Vital signs stable.

## 2011-09-04 NOTE — ED Notes (Signed)
Pt unable to void at the time. Will try again later. 

## 2011-09-05 LAB — URINE CULTURE: Colony Count: NO GROWTH

## 2011-09-15 ENCOUNTER — Ambulatory Visit (INDEPENDENT_AMBULATORY_CARE_PROVIDER_SITE_OTHER): Payer: Medicare Other | Admitting: Internal Medicine

## 2011-09-15 ENCOUNTER — Other Ambulatory Visit (INDEPENDENT_AMBULATORY_CARE_PROVIDER_SITE_OTHER): Payer: Medicare Other

## 2011-09-15 ENCOUNTER — Encounter: Payer: Self-pay | Admitting: Internal Medicine

## 2011-09-15 VITALS — BP 106/70 | HR 56 | Ht 67.5 in | Wt 195.0 lb

## 2011-09-15 DIAGNOSIS — K589 Irritable bowel syndrome without diarrhea: Secondary | ICD-10-CM

## 2011-09-15 DIAGNOSIS — D509 Iron deficiency anemia, unspecified: Secondary | ICD-10-CM

## 2011-09-15 LAB — CBC WITH DIFFERENTIAL/PLATELET
Basophils Relative: 0.6 % (ref 0.0–3.0)
Eosinophils Relative: 1.5 % (ref 0.0–5.0)
Lymphocytes Relative: 17 % (ref 12.0–46.0)
MCV: 81.4 fl (ref 78.0–100.0)
Monocytes Relative: 9 % (ref 3.0–12.0)
Neutrophils Relative %: 71.9 % (ref 43.0–77.0)
Platelets: 246 10*3/uL (ref 150.0–400.0)
RBC: 3.99 Mil/uL — ABNORMAL LOW (ref 4.22–5.81)
WBC: 7.3 10*3/uL (ref 4.5–10.5)

## 2011-09-15 LAB — IGA: IgA: 143 mg/dL (ref 68–378)

## 2011-09-15 NOTE — Progress Notes (Signed)
Patient ID: [name], male   DOB: 17-Aug-1945, 66 y.o.   MRN: [id number]   The patient presents for followup of iron deficiency anemia. He is here with his wife.  He was hospitalized in July, he had a presyncopal or syncopal episode was found to be anemic and iron deficient. He vomited dark brown material. He underwent an EGD and a colonoscopy by Dr. Oscar La that were unrevealing though he does have hemorrhoids and will have some rectal bleeding associated with constipation from them. He has not had any rectal bleeding since discharge. Iron supplementation constipate him. He has had recurrent anemia over the years, going back to 2008] earlier, he was also anemic last year and had an EGD and colonoscopy at the [place] in [place]. He was without health insurance and was pursuing care through the [place], he is now on Medicare and wants to followup here and with his other [facility] physicians. These are reportedly unremarkable as well or least without significant problems. I do not see where Hemoccult of the stool was performed when he was hospitalized.  Medications, allergies, past medical history, past surgical history, family history and social history are reviewed and updated in the EMR.   Exam:  Pale, elderly no acute distress   Lab Results  Component Value Date   WBC 5.0 09/04/2011   HGB 9.7* 09/04/2011   HCT 30.1* 09/04/2011   MCV 83.4 09/04/2011   PLT 193 09/04/2011   Lab Results  Component Value Date   FERRITIN 14* 08/29/2011   Lab Results  Component Value Date   VITAMINB12 272 08/29/2011    Assessment and plan:  1. Iron deficiency anemia, unspecified   Multiple upper endoscopy and colonoscopy procedures have failed to reveal a source of anemia. He apparently eats iron, he was on iron supplementation when he presented to the hospital.   2. IBS (irritable bowel syndrome)   He has been diagnosed with this with a constipation predominance at this point though that could be from iron  supplementation. The possibility of occult celiac disease does exist.    1. He could be having angiodysplasia of the small bowel leaking blood. He has never been tested for celiac disease. As worthwhile. 2. I will recheck a CBC, also check a tissue transglutaminase antibody and IgA level. If he does not have serologic evidence of celiac disease we have decided to proceed with a small bowel capsule endoscopy looking for occult small bowel lesions that are leaking blood. Risks benefits and indications of the procedure explained he understands and agrees to proceed.   15 minutes time spent over at which was in counseling and coordination of care.  I appreciate the opportunity to care for this patient.

## 2011-09-15 NOTE — Patient Instructions (Addendum)
Your physician has requested that you go to the basement for the following lab work before leaving today: CBC, TTG, IGA  We will call you with plans once lab work results are in.  Thank you for choosing me and [facility].  Iva Boop, M.D., [facility]

## 2011-09-16 LAB — TISSUE TRANSGLUTAMINASE, IGA: Tissue Transglutaminase Ab, IgA: 3.2 U/mL (ref <20)

## 2011-09-18 NOTE — Progress Notes (Signed)
Quick Note:  Celiac testing is negative  Proceed with capsule endo small bowel  Dx is 280.9 and 792.1  Leave him on all meds (do not stop NSAID's) He is a surgical candidate Had multiple EGD/colonoscopy unrevealing  Main ? Would be ? AVM's or other source recurrent Fe defic anemia ______

## 2011-09-22 ENCOUNTER — Telehealth: Payer: Self-pay | Admitting: *Deleted

## 2011-09-22 NOTE — Telephone Encounter (Signed)
Pt here for Capsule Endo Teaching. Explained why test was necessary, what areas the capsule will record and the prep for the procedure. Pt is very concerned that the pill will get stuck and he will need surgery to remove it. Explained that I will send this note to Dr Leone Payor and have him decide if maybe he can take a laxative after the procedure. He is also concerned with his heart and wants to be assured that Dr Jens Som will know about the procedure and OK. Promised pt I will send Dr Jens Som a note, but there is a chance he is out this week so it may be a few days before I hear from him; pt stated understanding. Dr Leone Payor, please advise. Dr Jens Som, please reply.

## 2011-09-22 NOTE — Telephone Encounter (Signed)
Ok for procedure Brian Crenshaw  

## 2011-09-23 NOTE — Telephone Encounter (Signed)
Informed pt Dr Jens Som answered back and stated it's OK to do the Capsule Endo. I will call him when I hear from Dr Leone Payor; pt stated understanding. Pt asked whether he has to shave his abdomen and Instructed him not to since he will wear the sensor belt.

## 2011-09-26 NOTE — Telephone Encounter (Signed)
I think it makes sense for hm to have the capsule endoscopy procedure as explained at visit. There is a very rare risk of the capsule lodging but if it does it would represent a problem that needs to be fixed so I believe it would be helpful in that regard.

## 2011-09-27 NOTE — Telephone Encounter (Signed)
He does not have to do it  We should cancel it and schedule a follow-up non-urgent with me

## 2011-09-27 NOTE — Telephone Encounter (Signed)
Phoned pt to inform him Dr Leone Payor states there is a very rare risk of the capsule lodging or getting stuck in his colon. Pt states he's glad I called and he's very reluctant about having this procedure. He states he has so many health problems and is anemic and he just doesn't think he can handle surgery. He wants to know what was done prior to the pill cam and I informed him I didn't know; exploratory surgery? Anyway, is there something else I can tell pt or can you call him back, Dr Leone Payor? Thanks.

## 2011-09-28 NOTE — Telephone Encounter (Signed)
Called pt to inform him Dr Leone Payor states he doesn't have to have the capsule if he is this uncomfortable; just schedule him for a visit to discuss. Pt stated understanding and was relieved. Mailed him the appt time and he may call to change it.

## 2011-10-21 ENCOUNTER — Encounter: Payer: Self-pay | Admitting: Internal Medicine

## 2011-10-21 ENCOUNTER — Ambulatory Visit (INDEPENDENT_AMBULATORY_CARE_PROVIDER_SITE_OTHER): Payer: Medicare Other | Admitting: Internal Medicine

## 2011-10-21 ENCOUNTER — Other Ambulatory Visit (INDEPENDENT_AMBULATORY_CARE_PROVIDER_SITE_OTHER): Payer: Medicare Other

## 2011-10-21 VITALS — BP 98/74 | HR 56 | Ht 68.0 in | Wt 201.0 lb

## 2011-10-21 DIAGNOSIS — K5900 Constipation, unspecified: Secondary | ICD-10-CM | POA: Diagnosis not present

## 2011-10-21 DIAGNOSIS — D509 Iron deficiency anemia, unspecified: Secondary | ICD-10-CM | POA: Diagnosis not present

## 2011-10-21 LAB — CBC WITH DIFFERENTIAL/PLATELET
Basophils Relative: 0.4 % (ref 0.0–3.0)
Eosinophils Absolute: 0.1 10*3/uL (ref 0.0–0.7)
Eosinophils Relative: 0.9 % (ref 0.0–5.0)
HCT: 26.2 % — ABNORMAL LOW (ref 39.0–52.0)
Hemoglobin: 8.1 g/dL — ABNORMAL LOW (ref 13.0–17.0)
MCHC: 30.9 g/dL (ref 30.0–36.0)
MCV: 80.4 fl (ref 78.0–100.0)
Monocytes Absolute: 0.8 10*3/uL (ref 0.1–1.0)
Neutro Abs: 5 10*3/uL (ref 1.4–7.7)
RBC: 3.26 Mil/uL — ABNORMAL LOW (ref 4.22–5.81)
WBC: 7.1 10*3/uL (ref 4.5–10.5)

## 2011-10-21 MED ORDER — POLYETHYLENE GLYCOL 3350 17 GM/SCOOP PO POWD: 17.0000 g | Freq: Every day | ORAL | Status: AC

## 2011-10-21 NOTE — Progress Notes (Signed)
Quick Note:  Hgb lower  Needs ferritin Iron/tibc Methylmalonic acid Homocysteine b12   ______

## 2011-10-21 NOTE — Patient Instructions (Addendum)
Your physician has requested that you go to the basement for the following lab work before leaving today: CBC  Please take the Miralax everyday for your constipation.  You may adjust the dosage up or down as needed but don't stop it.  Thank you for choosing me and [facility].  Iva Boop, M.D., [facility]

## 2011-10-21 NOTE — Progress Notes (Signed)
Patient ID: [name], male   DOB: 17-Apr-1945, 66 y.o.   MRN: [id number]   Patient presents for followup iron deficiency anemia. At last visit we have decided to proceed with capsule endoscopy of the small bowel if celiac testing were negative. It was, and we attempted to schedule a capsule endoscopy of the small bowel but he decided not to do it because of the fear of possible complication of capsule obstruction of the intestine.  He returns he feels okay, he continues on his iron but it constipates them. He uses a stool softener but he still straining and only moving his bowels 3 or 4 times a week. Stools are dark, iron-colored but no bleeding. He has MiraLax at home but hasn't used it.  Medications, allergies, past medical history, past surgical history, family history and social history are reviewed and updated in the EMR.  1. Iron deficiency anemia, cause unclear   2. Constipation    1. He is decided not to pursue a capsule endoscopy of the small bowel. He's had recurrent anemia problems for years so I don't think a malignancy is likely. There is a potentially correctable problem but if he had something like AVMs those are really difficult to eradicate so this is not an unreasonable course. 2. He needs lifelong iron supplementation and periodic measurement of CBCs 3. We'll check a CBC today 4. Use MiraLax on a daily basis adjust dose for affect 5. Office visit here as needed though I will continue to follow him to normalization of his CBC and once he strains his primary care, most that's been through the [place] he is going to try to get a local primary care physician as well, that can be taken over there. 6. If he ever has signs of bleeding we can consider repeat endoscopic evaluation, he was purported to have coffee ground emesis with his last admission on not convinced that was blood. EGD and colonoscopy did not show a cause for anemia.

## 2011-10-24 ENCOUNTER — Other Ambulatory Visit: Payer: Self-pay

## 2011-10-24 DIAGNOSIS — D649 Anemia, unspecified: Secondary | ICD-10-CM

## 2011-10-24 DIAGNOSIS — R6889 Other general symptoms and signs: Secondary | ICD-10-CM

## 2011-10-26 ENCOUNTER — Other Ambulatory Visit (INDEPENDENT_AMBULATORY_CARE_PROVIDER_SITE_OTHER): Payer: Medicare Other

## 2011-10-26 DIAGNOSIS — R6889 Other general symptoms and signs: Secondary | ICD-10-CM

## 2011-10-26 DIAGNOSIS — N3010 Interstitial cystitis (chronic) without hematuria: Secondary | ICD-10-CM | POA: Diagnosis not present

## 2011-10-26 DIAGNOSIS — D649 Anemia, unspecified: Secondary | ICD-10-CM | POA: Diagnosis not present

## 2011-10-26 DIAGNOSIS — R300 Dysuria: Secondary | ICD-10-CM | POA: Diagnosis not present

## 2011-10-26 DIAGNOSIS — R3916 Straining to void: Secondary | ICD-10-CM | POA: Diagnosis not present

## 2011-10-26 DIAGNOSIS — N476 Balanoposthitis: Secondary | ICD-10-CM | POA: Diagnosis not present

## 2011-10-26 LAB — VITAMIN B12: Vitamin B-12: 276 pg/mL (ref 211–911)

## 2011-10-26 LAB — FERRITIN: Ferritin: 8.5 ng/mL — ABNORMAL LOW (ref 22.0–322.0)

## 2011-11-01 ENCOUNTER — Other Ambulatory Visit: Payer: Self-pay

## 2011-11-01 DIAGNOSIS — D509 Iron deficiency anemia, unspecified: Secondary | ICD-10-CM

## 2011-11-01 NOTE — Progress Notes (Signed)
Quick Note:  His iron levels remain low  His B12 testing is ok  I recommend he get an iv iron infusion per protocol at short stay  Repeat CBC that day also  I remain concerned about losing blood from small bowel and would like him to reconsider the capsule endoscopy procedure - can schedule a non-urgent office visit to discuss again if he wants ______

## 2011-11-03 ENCOUNTER — Telehealth: Payer: Self-pay | Admitting: Internal Medicine

## 2011-11-03 NOTE — Telephone Encounter (Signed)
Discussed with the patient his concerns with Feraheme infusion.  He wants to know what Dr. Jens Som thinks about IV iron.  I have advised him to contact his office and I will send a copy to Dr. Jens Som with the plans.

## 2011-11-07 ENCOUNTER — Telehealth: Payer: Self-pay | Admitting: Cardiology

## 2011-11-07 NOTE — Telephone Encounter (Signed)
Spoke with pt, per dr Jens Som IV iron infusion will be fine.

## 2011-11-07 NOTE — Telephone Encounter (Signed)
Pt calling re procedure 10-2 iv for iron, will this be a problem with his heart?

## 2011-11-09 ENCOUNTER — Encounter (HOSPITAL_COMMUNITY): Payer: Self-pay

## 2011-11-09 ENCOUNTER — Encounter (HOSPITAL_COMMUNITY)
Admission: RE | Admit: 2011-11-09 | Discharge: 2011-11-09 | Disposition: A | Discharge status: Home / Self Care | Payer: Medicare Other | Source: Ambulatory Visit | Attending: Internal Medicine | Admitting: Internal Medicine

## 2011-11-09 VITALS — BP 115/65 | HR 51 | Temp 98.8°F | Resp 18

## 2011-11-09 DIAGNOSIS — D509 Iron deficiency anemia, unspecified: Secondary | ICD-10-CM | POA: Diagnosis not present

## 2011-11-09 LAB — CBC
Hemoglobin: 8.5 g/dL — ABNORMAL LOW (ref 13.0–17.0)
MCH: 24.7 pg — ABNORMAL LOW (ref 26.0–34.0)
MCHC: 29.7 g/dL — ABNORMAL LOW (ref 30.0–36.0)
MCV: 83.1 fL (ref 78.0–100.0)
Platelets: 198 10*3/uL (ref 150–400)

## 2011-11-09 MED ORDER — FERUMOXYTOL INJECTION 510 MG/17 ML
510.0000 mg | INTRAVENOUS | Status: DC
  Administered 2011-11-09: 510 mg via INTRAVENOUS
  Filled 2011-11-09: qty 17

## 2011-11-09 MED ORDER — SODIUM CHLORIDE 0.9 % IV SOLN
INTRAVENOUS | Status: DC
  Administered 2011-11-09: 10:00:00 via INTRAVENOUS

## 2011-11-10 NOTE — Progress Notes (Signed)
Quick Note:  Hgb a little better No new recommendations ______

## 2011-11-15 ENCOUNTER — Telehealth: Payer: Self-pay | Admitting: Cardiology

## 2011-11-15 ENCOUNTER — Telehealth: Payer: Self-pay | Admitting: Internal Medicine

## 2011-11-15 DIAGNOSIS — D509 Iron deficiency anemia, unspecified: Secondary | ICD-10-CM

## 2011-11-15 MED ORDER — FUROSEMIDE 40 MG PO TABS: 40.0000 mg | ORAL_TABLET | Freq: Once | ORAL | Status: DC

## 2011-11-15 NOTE — Telephone Encounter (Signed)
Peripheral edema is a side effect of the feraheme Cancel the next one elevate legs and see cardiologist (or PCP) about the edema  F/u CBC as planned or in 1 month  Add feraheme to allergies

## 2011-11-15 NOTE — Telephone Encounter (Signed)
Spoke with [name], he had an iron infusion yesterday and is due another infusion tomorrow. His feet and legs are swollen. He is keeping them propped up. He reports SOB but it is no different than his normal. He wants to make sure he should do the infusion tomorrow and if he should take a fluid pill. The [name] is currently due for a follow up appt. Will forward for dr Jens Som review

## 2011-11-15 NOTE — Telephone Encounter (Signed)
Spoke with pt, Aware of dr crenshaw's recommendations. Script sent to pharm 

## 2011-11-15 NOTE — Telephone Encounter (Signed)
New problem:  Went to hospital last week for iron infusion. Another appt on 10/9. Dr. Leone Payor office advise patient to call Dr. Jens Som. C/O feet swelling.

## 2011-11-15 NOTE — Telephone Encounter (Signed)
Patient reports significant pedal edema.  He reports that "this has never happened this bad before", he is scheduled for 2nd Feraheme tomorrow.  Is it ok for him to proceed? I have asked that he call his cardiologist about his pedal edema

## 2011-11-15 NOTE — Telephone Encounter (Signed)
Lasix 40 mg po x 1 after infusion; arrange fu ov [name]

## 2011-11-15 NOTE — Telephone Encounter (Signed)
Patient advised new labs entered for November 

## 2011-11-16 ENCOUNTER — Encounter (HOSPITAL_COMMUNITY): Admission: RE | Admit: 2011-11-16 | Payer: Medicare Other | Source: Ambulatory Visit

## 2011-11-18 ENCOUNTER — Telehealth: Payer: Self-pay | Admitting: Cardiology

## 2011-11-18 NOTE — Telephone Encounter (Signed)
Patient called because he said was given Lasix 40 mg one time only on 11/15/11 by Dr Jens Som, after Iron infusion. Patient states medication helped some , but he still has some swelling in feet, toes and ankles. Pt would like to know if he can have more lasix prescription send to his pharmacy. Pt has an appointment with Dr. Jens Som on 12/08/11.

## 2011-11-18 NOTE — Telephone Encounter (Signed)
Lasix 20 mg po daily prn edema, bmet one week [name]

## 2011-11-18 NOTE — Telephone Encounter (Signed)
Pt calling re fluid pill, seems to be working some, but feet and ankles still swollen, only was given one pill, took that and wants to know if should get more? pls advise

## 2011-11-21 MED ORDER — FUROSEMIDE 20 MG PO TABS: ORAL_TABLET | ORAL | Status: DC

## 2011-11-21 NOTE — Telephone Encounter (Signed)
Left message for pt to call.

## 2011-11-21 NOTE — Telephone Encounter (Signed)
Spoke with pt, Aware of dr Ludwig Clarks recommendations. Script sent to [facility]

## 2011-11-28 ENCOUNTER — Inpatient Hospital Stay (HOSPITAL_COMMUNITY)
Admission: EM | Admit: 2011-11-28 | Discharge: 2011-12-13 | DRG: 216 | Disposition: A | Discharge status: Home / Self Care | Payer: Medicare Other | Attending: Thoracic Surgery (Cardiothoracic Vascular Surgery) | Admitting: Thoracic Surgery (Cardiothoracic Vascular Surgery)

## 2011-11-28 ENCOUNTER — Emergency Department (HOSPITAL_COMMUNITY): Payer: Medicare Other

## 2011-11-28 ENCOUNTER — Encounter (HOSPITAL_COMMUNITY): Payer: Self-pay | Admitting: Emergency Medicine

## 2011-11-28 DIAGNOSIS — I059 Rheumatic mitral valve disease, unspecified: Secondary | ICD-10-CM | POA: Diagnosis not present

## 2011-11-28 DIAGNOSIS — G4700 Insomnia, unspecified: Secondary | ICD-10-CM | POA: Diagnosis present

## 2011-11-28 DIAGNOSIS — K560 Paralytic ileus: Secondary | ICD-10-CM | POA: Diagnosis not present

## 2011-11-28 DIAGNOSIS — Z7982 Long term (current) use of aspirin: Secondary | ICD-10-CM

## 2011-11-28 DIAGNOSIS — D509 Iron deficiency anemia, unspecified: Secondary | ICD-10-CM | POA: Diagnosis present

## 2011-11-28 DIAGNOSIS — E782 Mixed hyperlipidemia: Secondary | ICD-10-CM | POA: Diagnosis present

## 2011-11-28 DIAGNOSIS — I509 Heart failure, unspecified: Secondary | ICD-10-CM | POA: Diagnosis present

## 2011-11-28 DIAGNOSIS — E871 Hypo-osmolality and hyponatremia: Secondary | ICD-10-CM | POA: Diagnosis present

## 2011-11-28 DIAGNOSIS — R55 Syncope and collapse: Secondary | ICD-10-CM | POA: Diagnosis not present

## 2011-11-28 DIAGNOSIS — K219 Gastro-esophageal reflux disease without esophagitis: Secondary | ICD-10-CM | POA: Diagnosis present

## 2011-11-28 DIAGNOSIS — I359 Nonrheumatic aortic valve disorder, unspecified: Principal | ICD-10-CM | POA: Diagnosis present

## 2011-11-28 DIAGNOSIS — Z954 Presence of other heart-valve replacement: Secondary | ICD-10-CM

## 2011-11-28 DIAGNOSIS — R519 Headache, unspecified: Secondary | ICD-10-CM | POA: Diagnosis present

## 2011-11-28 DIAGNOSIS — F411 Generalized anxiety disorder: Secondary | ICD-10-CM | POA: Diagnosis present

## 2011-11-28 DIAGNOSIS — E119 Type 2 diabetes mellitus without complications: Secondary | ICD-10-CM | POA: Diagnosis present

## 2011-11-28 DIAGNOSIS — J984 Other disorders of lung: Secondary | ICD-10-CM | POA: Diagnosis not present

## 2011-11-28 DIAGNOSIS — Z87891 Personal history of nicotine dependence: Secondary | ICD-10-CM

## 2011-11-28 DIAGNOSIS — R51 Headache: Secondary | ICD-10-CM | POA: Diagnosis present

## 2011-11-28 DIAGNOSIS — D649 Anemia, unspecified: Secondary | ICD-10-CM

## 2011-11-28 DIAGNOSIS — I10 Essential (primary) hypertension: Secondary | ICD-10-CM | POA: Diagnosis present

## 2011-11-28 DIAGNOSIS — I350 Nonrheumatic aortic (valve) stenosis: Secondary | ICD-10-CM | POA: Diagnosis present

## 2011-11-28 DIAGNOSIS — I5043 Acute on chronic combined systolic (congestive) and diastolic (congestive) heart failure: Secondary | ICD-10-CM | POA: Diagnosis present

## 2011-11-28 DIAGNOSIS — G2581 Restless legs syndrome: Secondary | ICD-10-CM | POA: Diagnosis present

## 2011-11-28 DIAGNOSIS — K5900 Constipation, unspecified: Secondary | ICD-10-CM | POA: Diagnosis present

## 2011-11-28 DIAGNOSIS — Z952 Presence of prosthetic heart valve: Secondary | ICD-10-CM

## 2011-11-28 DIAGNOSIS — G4733 Obstructive sleep apnea (adult) (pediatric): Secondary | ICD-10-CM | POA: Diagnosis present

## 2011-11-28 DIAGNOSIS — R404 Transient alteration of awareness: Secondary | ICD-10-CM | POA: Diagnosis not present

## 2011-11-28 DIAGNOSIS — D62 Acute posthemorrhagic anemia: Secondary | ICD-10-CM | POA: Diagnosis not present

## 2011-11-28 DIAGNOSIS — I498 Other specified cardiac arrhythmias: Secondary | ICD-10-CM | POA: Diagnosis present

## 2011-11-28 DIAGNOSIS — M1990 Unspecified osteoarthritis, unspecified site: Secondary | ICD-10-CM | POA: Diagnosis present

## 2011-11-28 DIAGNOSIS — Z953 Presence of xenogenic heart valve: Secondary | ICD-10-CM

## 2011-11-28 DIAGNOSIS — E785 Hyperlipidemia, unspecified: Secondary | ICD-10-CM | POA: Diagnosis present

## 2011-11-28 HISTORY — DX: Presence of xenogenic heart valve: Z95.3

## 2011-11-28 HISTORY — DX: Nonrheumatic aortic (valve) stenosis: I35.0

## 2011-11-28 LAB — PROTIME-INR: Prothrombin Time: 15.2 seconds (ref 11.6–15.2)

## 2011-11-28 LAB — LIPID PANEL
HDL: 40 mg/dL (ref >39)
LDL Cholesterol: 50 mg/dL (ref 0–99)
Total CHOL/HDL Ratio: 2.6 RATIO
VLDL: 15 mg/dL (ref 0–40)

## 2011-11-28 LAB — URINE MICROSCOPIC-ADD ON

## 2011-11-28 LAB — BASIC METABOLIC PANEL
Calcium: 9.6 mg/dL (ref 8.4–10.5)
Creatinine, Ser: 0.75 mg/dL (ref 0.50–1.35)
GFR calc non Af Amer: >90 mL/min (ref >90)
Glucose, Bld: 135 mg/dL — ABNORMAL HIGH (ref 70–99)
Sodium: 132 mEq/L — ABNORMAL LOW (ref 135–145)

## 2011-11-28 LAB — URINALYSIS, ROUTINE W REFLEX MICROSCOPIC
Glucose, UA: NEGATIVE mg/dL
Ketones, ur: NEGATIVE mg/dL
Protein, ur: 30 mg/dL — AB
Urobilinogen, UA: 1 mg/dL (ref 0.0–1.0)

## 2011-11-28 LAB — CBC WITH DIFFERENTIAL/PLATELET
Basophils Relative: 0 % (ref 0–1)
Eosinophils Absolute: 0.1 10*3/uL (ref 0.0–0.7)
Eosinophils Relative: 1 % (ref 0–5)
HCT: 27 % — ABNORMAL LOW (ref 39.0–52.0)
Hemoglobin: 8.5 g/dL — ABNORMAL LOW (ref 13.0–17.0)
Lymphs Abs: 1 10*3/uL (ref 0.7–4.0)
MCH: 28.6 pg (ref 26.0–34.0)
MCHC: 31.5 g/dL (ref 30.0–36.0)
MCV: 90.9 fL (ref 78.0–100.0)
Monocytes Absolute: 0.6 10*3/uL (ref 0.1–1.0)
Neutro Abs: 7.3 10*3/uL (ref 1.7–7.7)
Neutrophils Relative %: 81 % — ABNORMAL HIGH (ref 43–77)
RBC: 2.97 MIL/uL — ABNORMAL LOW (ref 4.22–5.81)

## 2011-11-28 LAB — HEMOGLOBIN A1C
Hgb A1c MFr Bld: 4.4 % (ref <5.7)
Mean Plasma Glucose: 80 mg/dL (ref <117)

## 2011-11-28 LAB — PRO B NATRIURETIC PEPTIDE: Pro B Natriuretic peptide (BNP): 2223 pg/mL — ABNORMAL HIGH (ref 0–125)

## 2011-11-28 LAB — D-DIMER, QUANTITATIVE: D-Dimer, Quant: 0.6 ug/mL-FEU — ABNORMAL HIGH (ref 0.00–0.48)

## 2011-11-28 LAB — GLUCOSE, CAPILLARY

## 2011-11-28 LAB — PREPARE RBC (CROSSMATCH)

## 2011-11-28 MED ORDER — ASPIRIN 81 MG PO TABS: 81.0000 mg | ORAL_TABLET | Freq: Every day | ORAL | Status: DC

## 2011-11-28 MED ORDER — PANTOPRAZOLE SODIUM 40 MG PO TBEC
40.0000 mg | DELAYED_RELEASE_TABLET | Freq: Every day | ORAL | Status: DC
  Administered 2011-11-28 – 2011-12-05 (×7): 40 mg via ORAL
  Filled 2011-11-28 (×7): qty 1

## 2011-11-28 MED ORDER — GABAPENTIN 300 MG PO CAPS
300.0000 mg | ORAL_CAPSULE | Freq: Two times a day (BID) | ORAL | Status: DC
  Administered 2011-11-28 – 2011-12-05 (×15): 300 mg via ORAL
  Filled 2011-11-28 (×18): qty 1

## 2011-11-28 MED ORDER — LOSARTAN POTASSIUM 50 MG PO TABS
100.0000 mg | ORAL_TABLET | Freq: Every day | ORAL | Status: DC
  Filled 2011-11-28: qty 2

## 2011-11-28 MED ORDER — ONDANSETRON HCL 4 MG/2ML IJ SOLN: 4.0000 mg | Freq: Four times a day (QID) | INTRAMUSCULAR | Status: DC | PRN

## 2011-11-28 MED ORDER — CLONIDINE HCL 0.2 MG PO TABS
0.2000 mg | ORAL_TABLET | Freq: Two times a day (BID) | ORAL | Status: DC
  Administered 2011-11-28 – 2011-11-29 (×4): 0.2 mg via ORAL
  Filled 2011-11-28 (×6): qty 1

## 2011-11-28 MED ORDER — ACETAMINOPHEN 325 MG PO TABS
650.0000 mg | ORAL_TABLET | Freq: Four times a day (QID) | ORAL | Status: DC | PRN
  Administered 2011-11-28 – 2011-11-30 (×4): 650 mg via ORAL
  Filled 2011-11-28 (×4): qty 2

## 2011-11-28 MED ORDER — FUROSEMIDE 10 MG/ML IJ SOLN
40.0000 mg | Freq: Once | INTRAMUSCULAR | Status: AC
  Administered 2011-11-28: 40 mg via INTRAVENOUS

## 2011-11-28 MED ORDER — METOPROLOL TARTRATE 100 MG PO TABS
100.0000 mg | ORAL_TABLET | Freq: Two times a day (BID) | ORAL | Status: DC
  Filled 2011-11-28 (×2): qty 1

## 2011-11-28 MED ORDER — INSULIN ASPART 100 UNIT/ML ~~LOC~~ SOLN
0.0000 [IU] | SUBCUTANEOUS | Status: DC
  Administered 2011-11-28 – 2011-11-29 (×3): 1 [IU] via SUBCUTANEOUS
  Administered 2011-11-29: 2 [IU] via SUBCUTANEOUS

## 2011-11-28 MED ORDER — ASPIRIN EC 81 MG PO TBEC
81.0000 mg | DELAYED_RELEASE_TABLET | Freq: Every day | ORAL | Status: DC
  Administered 2011-11-28 – 2011-12-05 (×7): 81 mg via ORAL
  Filled 2011-11-28 (×9): qty 1

## 2011-11-28 MED ORDER — ONDANSETRON HCL 4 MG PO TABS: 4.0000 mg | ORAL_TABLET | Freq: Four times a day (QID) | ORAL | Status: DC | PRN

## 2011-11-28 MED ORDER — SODIUM CHLORIDE 0.9 % IJ SOLN
3.0000 mL | Freq: Two times a day (BID) | INTRAMUSCULAR | Status: DC
  Administered 2011-11-28 – 2011-12-03 (×10): 3 mL via INTRAVENOUS
  Administered 2011-12-03: 10:00:00 via INTRAVENOUS
  Administered 2011-12-04 – 2011-12-05 (×4): 3 mL via INTRAVENOUS

## 2011-11-28 MED ORDER — ALUM & MAG HYDROXIDE-SIMETH 200-200-20 MG/5ML PO SUSP: 30.0000 mL | Freq: Four times a day (QID) | ORAL | Status: DC | PRN

## 2011-11-28 MED ORDER — OMEGA-3-ACID ETHYL ESTERS 1 G PO CAPS
1.0000 g | ORAL_CAPSULE | Freq: Two times a day (BID) | ORAL | Status: DC
  Administered 2011-11-28 – 2011-12-05 (×15): 1 g via ORAL
  Filled 2011-11-28 (×18): qty 1

## 2011-11-28 MED ORDER — CARBIDOPA-LEVODOPA 25-100 MG PO TABS
1.0000 | ORAL_TABLET | Freq: Two times a day (BID) | ORAL | Status: DC
  Administered 2011-11-28 – 2011-12-01 (×6): 1 via ORAL
  Filled 2011-11-28 (×9): qty 1

## 2011-11-28 MED ORDER — FINASTERIDE 5 MG PO TABS
5.0000 mg | ORAL_TABLET | Freq: Every day | ORAL | Status: DC
  Administered 2011-11-28 – 2011-12-05 (×7): 5 mg via ORAL
  Filled 2011-11-28 (×9): qty 1

## 2011-11-28 MED ORDER — ACETAMINOPHEN 650 MG RE SUPP: 650.0000 mg | Freq: Four times a day (QID) | RECTAL | Status: DC | PRN

## 2011-11-28 MED ORDER — OMEGA-3 FATTY ACIDS 1000 MG PO CAPS
1.0000 g | ORAL_CAPSULE | Freq: Two times a day (BID) | ORAL | Status: DC
Start: 2011-11-28 — End: 2011-11-28

## 2011-11-28 MED ORDER — HYDROCHLOROTHIAZIDE 12.5 MG PO CAPS
12.5000 mg | ORAL_CAPSULE | Freq: Every day | ORAL | Status: DC
  Filled 2011-11-28: qty 1

## 2011-11-28 MED ORDER — DOCUSATE SODIUM 100 MG PO CAPS
100.0000 mg | ORAL_CAPSULE | Freq: Two times a day (BID) | ORAL | Status: DC
  Administered 2011-11-28 – 2011-11-29 (×3): 100 mg via ORAL
  Filled 2011-11-28 (×6): qty 1

## 2011-11-28 NOTE — ED Notes (Signed)
Pt report syncopal episode at home witnessed by wife,  Pt reports short period of chest pain just prior to syncope and pt passed out and lips turned blue, EMS arrival pt was A&O x3 c/o headache and nausea stroke scale negative at scene CBg 76 negative orthostatics LVH on monitor

## 2011-11-28 NOTE — H&P (Signed)
Internal Medicine Teaching Service Attending Note Date: 11/28/2011  Patient name: [name]  Medical record number: [id number]  Date of birth: 1945-07-15   I have seen and evaluated [name] and discussed their care with the Residency Team.  66 yo M with hx of AS with porcine AVR 2006, DM2 x 3 years and recurrent syncopal episodes. He was at home overnight and had a severe L frontal headache. He was unable to sleep. He stated that he felt like passing out. He got up and was trying to awaken his wife when he became unconscious. Per his wife, this episode was different than previous episodes- she felt he was apneic, was out for 4-6 minutes. He felt stiff to her and had urinary incontinence.  ROS- no SOB. Has had chest pain in his upper chest. No palpitations. No recent dizziness. Checks his FSG at home ~ weekly (did not check after episode). did have ophtho this year. Has some numbness/paresthesias in LLE.    Marland Kitchen aspirin EC  81 mg Oral Daily  . carbidopa-levodopa  1 tablet Oral BID  . cloNIDine  0.2 mg Oral BID  . docusate sodium  100 mg Oral BID  . finasteride  5 mg Oral Daily  . gabapentin  300 mg Oral BID  . insulin aspart  0-9 Units Subcutaneous Q4H  . omega-3 acid ethyl esters  1 g Oral BID  . pantoprazole  40 mg Oral Daily  . sodium chloride  3 mL Intravenous Q12H  . DISCONTD: aspirin  81 mg Oral Daily  . DISCONTD: fish oil-omega-3 fatty acids  1 g Oral BID  . DISCONTD: hydrochlorothiazide  12.5 mg Oral Daily  . DISCONTD: losartan  100 mg Oral Daily  . DISCONTD: metoprolol  100 mg Oral BID     Physical Exam: Blood pressure 132/74, pulse 58, temperature 98.5 F (36.9 C), temperature source Oral, resp. rate 18, height 5\' 9"  (1.753 m), weight 86.818 kg (191 lb 6.4 oz), SpO2 100.00%. General appearance: alert, cooperative and no distress Eyes: negative findings: pupils equal, round, reactive to light and accomodation Throat: normal findings: oropharynx pink & moist without  lesions or evidence of thrush Back: symetric.  Lungs: rhonchi bibasilar and mild Heart: systolic murmur: early systolic 4/6, crescendo at 2nd right intercostal space and bradycardia Abdomen: normal findings: bowel sounds normal and soft, non-tender Extremities: edema trace No dizziness with rotation of head/neck. No dizziness with sitting up.   Lab results: Results for orders placed during the hospital encounter of 11/28/11 (from the past 24 hour(s))  BASIC METABOLIC PANEL     Status: Abnormal   Collection Time   11/28/11  6:01 AM      Component Value Range   Sodium 132 (*) 135 - 145 mEq/L   Potassium 4.3  3.5 - 5.1 mEq/L   Chloride 98  96 - 112 mEq/L   CO2 27  19 - 32 mEq/L   Glucose, Bld 135 (*) 70 - 99 mg/dL   BUN 14  6 - 23 mg/dL   Creatinine, Ser 6.21  0.50 - 1.35 mg/dL   Calcium 9.6  8.4 - 30.8 mg/dL   GFR calc non Af Amer >90  >90 mL/min   GFR calc Af Amer >90  >90 mL/min  PROTIME-INR     Status: Normal   Collection Time   11/28/11  6:01 AM      Component Value Range   Prothrombin Time 15.2  11.6 - 15.2 seconds   INR  1.22  0.00 - 1.49  CBC WITH DIFFERENTIAL     Status: Abnormal   Collection Time   11/28/11  6:01 AM      Component Value Range   WBC 9.0  4.0 - 10.5 K/uL   RBC 2.97 (*) 4.22 - 5.81 MIL/uL   Hemoglobin 8.5 (*) 13.0 - 17.0 g/dL   HCT 16.1 (*) 09.6 - 04.5 %   MCV 90.9  78.0 - 100.0 fL   MCH 28.6  26.0 - 34.0 pg   MCHC 31.5  30.0 - 36.0 g/dL   RDW 40.9 (*) 81.1 - 91.4 %   Platelets 191  150 - 400 K/uL   Neutrophils Relative 81 (*) 43 - 77 %   Lymphocytes Relative 11 (*) 12 - 46 %   Monocytes Relative 7  3 - 12 %   Eosinophils Relative 1  0 - 5 %   Basophils Relative 0  0 - 1 %   Neutro Abs 7.3  1.7 - 7.7 K/uL   Lymphs Abs 1.0  0.7 - 4.0 K/uL   Monocytes Absolute 0.6  0.1 - 1.0 K/uL   Eosinophils Absolute 0.1  0.0 - 0.7 K/uL   Basophils Absolute 0.0  0.0 - 0.1 K/uL   RBC Morphology POLYCHROMASIA PRESENT    D-DIMER, QUANTITATIVE     Status: Abnormal    Collection Time   11/28/11  6:01 AM      Component Value Range   D-Dimer, Quant 0.60 (*) 0.00 - 0.48 ug/mL-FEU  PRO B NATRIURETIC PEPTIDE     Status: Abnormal   Collection Time   11/28/11  6:02 AM      Component Value Range   Pro B Natriuretic peptide (BNP) 2223.0 (*) 0 - 125 pg/mL  TROPONIN I     Status: Normal   Collection Time   11/28/11  6:02 AM      Component Value Range   Troponin I <0.30  <0.30 ng/mL  URINALYSIS, ROUTINE W REFLEX MICROSCOPIC     Status: Abnormal   Collection Time   11/28/11  6:12 AM      Component Value Range   Color, Urine YELLOW  YELLOW   APPearance CLOUDY (*) CLEAR   Specific Gravity, Urine 1.018  1.005 - 1.030   pH 5.5  5.0 - 8.0   Glucose, UA NEGATIVE  NEGATIVE mg/dL   Hgb urine dipstick TRACE (*) NEGATIVE   Bilirubin Urine NEGATIVE  NEGATIVE   Ketones, ur NEGATIVE  NEGATIVE mg/dL   Protein, ur 30 (*) NEGATIVE mg/dL   Urobilinogen, UA 1.0  0.0 - 1.0 mg/dL   Nitrite NEGATIVE  NEGATIVE   Leukocytes, UA NEGATIVE  NEGATIVE  URINE MICROSCOPIC-ADD ON     Status: Abnormal   Collection Time   11/28/11  6:12 AM      Component Value Range   Squamous Epithelial / LPF RARE  RARE   RBC / HPF 0-2  <3 RBC/hpf   Bacteria, UA MANY (*) RARE   Urine-Other AMORPHOUS MATERIAL PRESENT    TROPONIN I     Status: Normal   Collection Time   11/28/11 10:56 AM      Component Value Range   Troponin I <0.30  <0.30 ng/mL  LIPID PANEL     Status: Normal   Collection Time   11/28/11 11:00 AM      Component Value Range   Cholesterol 105  0 - 200 mg/dL   Triglycerides 77  <782 mg/dL   HDL  40  >39 mg/dL   Total CHOL/HDL Ratio 2.6     VLDL 15  0 - 40 mg/dL   LDL Cholesterol 50  0 - 99 mg/dL  PREPARE RBC (CROSSMATCH)     Status: Normal   Collection Time   11/28/11 11:40 AM      Component Value Range   Order Confirmation ORDER PROCESSED BY BLOOD BANK    TYPE AND SCREEN     Status: Normal (Preliminary result)   Collection Time   11/28/11 11:40 AM      Component  Value Range   ABO/RH(D) A NEG     Antibody Screen NEG     Sample Expiration 12/01/2011     Unit Number [id number]     Blood Component Type RED CELLS,LR     Unit division 00     Status of Unit ALLOCATED     Transfusion Status OK TO TRANSFUSE     Crossmatch Result Compatible    GLUCOSE, CAPILLARY     Status: Abnormal   Collection Time   11/28/11 11:59 AM      Component Value Range   Glucose-Capillary 108 (*) 70 - 99 mg/dL   Comment 1 Notify RN      Imaging results:  Dg Chest Port 1 View  11/28/2011  *RADIOLOGY REPORT*  Clinical Data: Syncope.  PORTABLE CHEST - 1 VIEW  Comparison: 08/31/2011.  Findings: Trachea is midline.  Heart is enlarged.  Lungs are low in volume with diffuse mixed interstitial and air space disease.  No definite pleural fluid.  IMPRESSION: Low lung volumes with presumed pulmonary edema.   Original Report Authenticated By: Reyes Ivan, M.D.     Assessment and Plan: I agree with the formulated Assessment and Plan with the following changes:  Syncopal Episode Bradycardia DM2 Anemia  Will- f/uhis cardiac enzymes Check a TTE Watch his temp curve  Follow FSGs Appreciate Dr Ludwig Clarks f/u  Comment- etiology of his recurrent syndrome is ? Would wonder if CV w/u is (-) if neuro eval would be worthwhile. Certainly valve failure could be a part if this and will hope his TTE will help with that eval.   Ginnie Smart, MD 10/21/20132:34 PM

## 2011-11-28 NOTE — Consult Note (Signed)
Reason for Consult: Syncope Referring Physician: Dr. Ninetta Lights Primary cardiologist: Dr. Lowanda Foster is an 66 y.o. male.  HPI: This 66 year old gentleman was admitted earlier today after having another episode of syncope.  He has had recurrent syncopal episodes in the past. No definite cause has been found.  The patient also has a history of severe iron deficiency anemia and no specific source of bleeding has been found despite thorough workup.  The patient is severely anemic again on this admission with a hemoglobin of 8.5.  The patient has a history of previous severe aortic stenosis and had a porcine aortic valve replacement in 2006.  Did not require coronary bypass surgery.  Patient also has a history of obstructive sleep apnea.  The patient is a type II diabetic Last night the patient awoke at about 2:30 in the morning because of a headache.  He stayed up and watched television until about 4 AM.  As he was climbing back into bed he awakened his wife to tell her that he felt like he might pass out and then he did pass out.  According to his wife he was unresponsive for about 4 or 6 minutes and he did have urinary incontinence. The patient denies any history to suggest angina pectoris.  He has had exertional dyspnea and peripheral edema which he has attributed to his severe anemia.  He does have a past history of hypertension and has been on clonidine 0.2 mg twice a day  Past Medical History  Diagnosis Date  . HTN (hypertension)   . GERD (gastroesophageal reflux disease)     bravo pH study 2008  . Hyperlipidemia   . Hemorrhoids     external and internal  . Insomnia   . Allergic rhinitis   . OA (osteoarthritis)   . Hiatal hernia   . BPH (benign prostatic hypertrophy)   . Chronic cough   . Anemia 2009  . Diverticulosis of colon     on colonoscopy 2008  . Heart murmur   . Shortness of breath   . OSA (obstructive sleep apnea)     USES CPAP AS NEEDED  . Headache   . IBS  (irritable bowel syndrome)   . Periodontitis     chronic with bone loss  . Gout   . Asthma   . Depression     pt denies  . Cataract   . Hypercholesterolemia   . Chronic interstitial cystitis   . Aortic stenosis   . H/O aortic valve replacement with porcine valve 2006  . Diabetes mellitus     type 2    Past Surgical History  Procedure Date  . Aortic valve replacement   . Nasal septoplasty w/ turbinoplasty   . Hernia repair   . Right knee arthroscopy   . Bunionectomy     right  . Cataract extraction 2009    &   2012    BILATERAL  . Esophagogastroduodenoscopy 08/30/2011    Procedure: ESOPHAGOGASTRODUODENOSCOPY (EGD);  Surgeon: Louis Meckel, MD;  Location: [facility] ENDOSCOPY;  Service: Endoscopy;  Laterality: N/A;  Rm 3005   . Colonoscopy 08/31/2011    Procedure: COLONOSCOPY;  Surgeon: Louis Meckel, MD;  Location: [facility] ENDOSCOPY;  Service: Endoscopy;  Laterality: N/A;  . Refractive surgery     bilateral    Family History  Problem Relation Age of Onset  . Heart disease Father   . Osteoarthritis Mother   . Hypertension Sister   . Hyperlipidemia  Social History:  reports that he quit smoking about 40 years ago. He has never used smokeless tobacco. He reports that he does not drink alcohol or use illicit drugs.  Allergies:  Allergies  Allergen Reactions  . Codeine   . Codeine Phosphate     REACTION: unspecified  . Dutasteride   . Feraheme (Ferumoxytol) Swelling    Pedal edema    Medications:  Scheduled:   . aspirin EC  81 mg Oral Daily  . carbidopa-levodopa  1 tablet Oral BID  . cloNIDine  0.2 mg Oral BID  . docusate sodium  100 mg Oral BID  . finasteride  5 mg Oral Daily  . furosemide  40 mg Intravenous Once  . gabapentin  300 mg Oral BID  . insulin aspart  0-9 Units Subcutaneous Q4H  . omega-3 acid ethyl esters  1 g Oral BID  . pantoprazole  40 mg Oral Daily  . sodium chloride  3 mL Intravenous Q12H  . DISCONTD: aspirin  81 mg Oral Daily  . DISCONTD:  fish oil-omega-3 fatty acids  1 g Oral BID  . DISCONTD: hydrochlorothiazide  12.5 mg Oral Daily  . DISCONTD: losartan  100 mg Oral Daily  . DISCONTD: metoprolol  100 mg Oral BID    Results for orders placed during the hospital encounter of 11/28/11 (from the past 48 hour(s))  BASIC METABOLIC PANEL     Status: Abnormal   Collection Time   11/28/11  6:01 AM      Component Value Range Comment   Sodium 132 (*) 135 - 145 mEq/L    Potassium 4.3  3.5 - 5.1 mEq/L    Chloride 98  96 - 112 mEq/L    CO2 27  19 - 32 mEq/L    Glucose, Bld 135 (*) 70 - 99 mg/dL    BUN 14  6 - 23 mg/dL    Creatinine, Ser 1.61  0.50 - 1.35 mg/dL    Calcium 9.6  8.4 - 09.6 mg/dL    GFR calc non Af Amer >90  >90 mL/min    GFR calc Af Amer >90  >90 mL/min   PROTIME-INR     Status: Normal   Collection Time   11/28/11  6:01 AM      Component Value Range Comment   Prothrombin Time 15.2  11.6 - 15.2 seconds    INR 1.22  0.00 - 1.49   CBC WITH DIFFERENTIAL     Status: Abnormal   Collection Time   11/28/11  6:01 AM      Component Value Range Comment   WBC 9.0  4.0 - 10.5 K/uL    RBC 2.97 (*) 4.22 - 5.81 MIL/uL    Hemoglobin 8.5 (*) 13.0 - 17.0 g/dL    HCT 04.5 (*) 40.9 - 52.0 %    MCV 90.9  78.0 - 100.0 fL    MCH 28.6  26.0 - 34.0 pg    MCHC 31.5  30.0 - 36.0 g/dL    RDW 81.1 (*) 91.4 - 15.5 %    Platelets 191  150 - 400 K/uL    Neutrophils Relative 81 (*) 43 - 77 %    Lymphocytes Relative 11 (*) 12 - 46 %    Monocytes Relative 7  3 - 12 %    Eosinophils Relative 1  0 - 5 %    Basophils Relative 0  0 - 1 %    Neutro Abs 7.3  1.7 - 7.7 K/uL  Lymphs Abs 1.0  0.7 - 4.0 K/uL    Monocytes Absolute 0.6  0.1 - 1.0 K/uL    Eosinophils Absolute 0.1  0.0 - 0.7 K/uL    Basophils Absolute 0.0  0.0 - 0.1 K/uL    RBC Morphology POLYCHROMASIA PRESENT   BASOPHILIC STIPPLING  D-DIMER, QUANTITATIVE     Status: Abnormal   Collection Time   11/28/11  6:01 AM      Component Value Range Comment   D-Dimer, Quant 0.60 (*)  0.00 - 0.48 ug/mL-FEU   PRO B NATRIURETIC PEPTIDE     Status: Abnormal   Collection Time   11/28/11  6:02 AM      Component Value Range Comment   Pro B Natriuretic peptide (BNP) 2223.0 (*) 0 - 125 pg/mL   TROPONIN I     Status: Normal   Collection Time   11/28/11  6:02 AM      Component Value Range Comment   Troponin I <0.30  <0.30 ng/mL   URINALYSIS, ROUTINE W REFLEX MICROSCOPIC     Status: Abnormal   Collection Time   11/28/11  6:12 AM      Component Value Range Comment   Color, Urine YELLOW  YELLOW    APPearance CLOUDY (*) CLEAR    Specific Gravity, Urine 1.018  1.005 - 1.030    pH 5.5  5.0 - 8.0    Glucose, UA NEGATIVE  NEGATIVE mg/dL    Hgb urine dipstick TRACE (*) NEGATIVE    Bilirubin Urine NEGATIVE  NEGATIVE    Ketones, ur NEGATIVE  NEGATIVE mg/dL    Protein, ur 30 (*) NEGATIVE mg/dL    Urobilinogen, UA 1.0  0.0 - 1.0 mg/dL    Nitrite NEGATIVE  NEGATIVE    Leukocytes, UA NEGATIVE  NEGATIVE   URINE MICROSCOPIC-ADD ON     Status: Abnormal   Collection Time   11/28/11  6:12 AM      Component Value Range Comment   Squamous Epithelial / LPF RARE  RARE    RBC / HPF 0-2  <3 RBC/hpf    Bacteria, UA MANY (*) RARE    Urine-Other AMORPHOUS MATERIAL PRESENT     TSH     Status: Normal   Collection Time   11/28/11 10:56 AM      Component Value Range Comment   TSH 1.233  0.350 - 4.500 uIU/mL   TROPONIN I     Status: Normal   Collection Time   11/28/11 10:56 AM      Component Value Range Comment   Troponin I <0.30  <0.30 ng/mL   LIPID PANEL     Status: Normal   Collection Time   11/28/11 11:00 AM      Component Value Range Comment   Cholesterol 105  0 - 200 mg/dL    Triglycerides 77  <161 mg/dL    HDL 40  >09 mg/dL    Total CHOL/HDL Ratio 2.6      VLDL 15  0 - 40 mg/dL    LDL Cholesterol 50  0 - 99 mg/dL   PREPARE RBC (CROSSMATCH)     Status: Normal   Collection Time   11/28/11 11:40 AM      Component Value Range Comment   Order Confirmation ORDER PROCESSED BY BLOOD  BANK     TYPE AND SCREEN     Status: Normal (Preliminary result)   Collection Time   11/28/11 11:40 AM      Component Value Range  Comment   ABO/RH(D) A NEG      Antibody Screen NEG      Sample Expiration 12/01/2011      Unit Number [id number]      Blood Component Type RED CELLS,LR      Unit division 00      Status of Unit ALLOCATED      Transfusion Status OK TO TRANSFUSE      Crossmatch Result Compatible     GLUCOSE, CAPILLARY     Status: Abnormal   Collection Time   11/28/11 11:59 AM      Component Value Range Comment   Glucose-Capillary 108 (*) 70 - 99 mg/dL    Comment 1 Notify RN     GLUCOSE, CAPILLARY     Status: Abnormal   Collection Time   11/28/11  4:22 PM      Component Value Range Comment   Glucose-Capillary 124 (*) 70 - 99 mg/dL    Comment 1 Notify RN     TROPONIN I     Status: Normal   Collection Time   11/28/11  4:52 PM      Component Value Range Comment   Troponin I <0.30  <0.30 ng/mL     Dg Chest Port 1 View  11/28/2011  *RADIOLOGY REPORT*  Clinical Data: Syncope.  PORTABLE CHEST - 1 VIEW  Comparison: 08/31/2011.  Findings: Trachea is midline.  Heart is enlarged.  Lungs are low in volume with diffuse mixed interstitial and air space disease.  No definite pleural fluid.  IMPRESSION: Low lung volumes with presumed pulmonary edema.   Original Report Authenticated By: Reyes Ivan, M.D.     Review of systems is positive for chronic anemia and for multiple prior episodes of syncope Blood pressure 115/69, pulse 64, temperature 98.2 F (36.8 C), temperature source Oral, resp. rate 19, height 5\' 9"  (1.753 m), weight 191 lb 6.4 oz (86.818 kg), SpO2 96.00%. The patient appears to be in no distress.  Skin color is pale.  Head and neck exam reveals that the pupils are equal and reactive.  The extraocular movements are full.  There is no scleral icterus.  Mouth and pharynx are benign.  No lymphadenopathy.  There are bilateral carotid bruits transmitted from the  heart.  Carotid upstroke appears to be normal.  The jugular venous pressure is normal.  Thyroid is not enlarged or tender.  Chest is clear to percussion and auscultation.  No rales or rhonchi.  Expansion of the chest is symmetrical.  Heart reveals a grade 3/6 harsh systolic ejection murmur loudest at the base and radiating to the neck.  No gallop or rub  The abdomen is soft and nontender.  Bowel sounds are normoactive.  There is no hepatosplenomegaly or mass.  There are no abdominal bruits.  Extremities reveal no phlebitis or edema.  Pedal pulses are good.  There is no cyanosis or clubbing.  Neurologic exam is normal strength and no lateralizing weakness.  No sensory deficits.  Integument reveals no rash EKG shows normal sinus rhythm and no ischemic changes.  Assessment/Plan: 1.  Recurrent episodes of syncope which may be multifactorial.  Certainly his severe anemia may be playing a role.  He may be having vasovagal reactions secondary to headache pain and possibly aggravated by his blood pressure medications including clonidine. 2. status post bovine aortic valve replacement in 2006 with a harsh precordial murmur.  Echocardiogram will be of great interest in evaluating his current prosthetic valve gradient.  Recommendation: Agree with  workup as outlined by internal medicine including his being transfused with packed red cells for his symptomatic anemia.  We will watch him closely for arrhythmias on the telemetry monitor although I do not strongly suspect an arrhythmic etiology. Will follow with you  Cassell Clement 11/28/2011, 8:02 PM

## 2011-11-28 NOTE — H&P (Signed)
Pt seen, discussed with h/0's. Agree with their a/p. Please see my note as well

## 2011-11-28 NOTE — ED Notes (Signed)
Lab called and D-dimer add on requested.

## 2011-11-28 NOTE — H&P (Signed)
Hospital Admission Note Date: 11/28/2011  Patient name: [name] Medical record number: [id number] Date of birth: 01-07-1946 Age: 66 y.o. Gender: male PCP: Provider Not In System  Medical Service: Internal medicine  Attending physician: Dr. Ninetta Lights    1st Contact: Earlene Plater    [phone] 2nd Contact: Ho    Pager:[phone] After 5 pm or weekends: 1st Contact:      Pager: [phone] 2nd Contact:      Pager: [phone]  Chief Complaint: syncope  History of Present Illness: 66 year old man with past medical history significant for aortic stenosis status post porcine valve replacement in 2006, type 2 diabetes, iron deficiency anemia, multiple admissions for syncopal events in the past last one was attributed to his anemia back in July 2013 presents to the ER with chief complaint of syncope.  History was obtained from the patient as well as his wife at the bedside. He was in his usual state of health until Saturday when the couple drove to [place] to see one of their friends and got back the same night. He was not feeling well on Sunday but didn't know what was exactly wrong with him. Patient reports that he woke up around 2 AM to go to the bathroom but couldn't fall back to sleep because of severe headache ( that has been persistent problem for him for last 1 year or so) .  He started watching TV and took some Tylenol with Dr. Reino Kent and his daily dose of metoprolol. He decided to go to bed around 4 AM when he started feeling that he is about to pass out and tried to wake up his wife. Next moment, he passed out on the bed hitting his head on his wife knees. His wife reports that he was unconscious for about 5-6 minutes. His whole body was very stiff and he wasn't breathing at all. She also reports that he turned blue and she had to give mouth to mouth breaths to get him back. She reports that his tongue was very swollen during the episode and he lost control of his bladder. Denies any shaking  or bowel incontinence or head injury. When he got back,  he was very slow to respond but was not confused. His wife reports that he was very cold and clammy during the episode.  He reports worsening of his baseline intermittent chronic chest pain ( for 1 year) since yesterday.  He states that he felt nauseated in the EMS but didn't throw up. Reports a history of having dry hacking cough for several years.Denies any palpitations, fevers or chills, hematemesis or melena.  Meds: Current Outpatient Rx  Name Route Sig Dispense Refill  . ACETAMINOPHEN 500 MG PO TABS Oral Take 500 mg by mouth every 6 (six) hours as needed. For pain    . ASPIRIN 81 MG PO TABS Oral Take 81 mg by mouth daily.    Marland Kitchen CARBIDOPA-LEVODOPA 25-100 MG PO TABS Oral Take 1 tablet by mouth 2 (two) times daily.      Marland Kitchen CLONIDINE HCL 0.2 MG PO TABS Oral Take 0.2 mg by mouth 2 (two) times daily.    Marland Kitchen DOCUSATE SODIUM 100 MG PO CAPS Oral Take 100 mg by mouth as needed. constipation    . FINASTERIDE 5 MG PO TABS Oral Take 5 mg by mouth daily.     . OMEGA-3 FATTY ACIDS 1000 MG PO CAPS Oral Take 1 g by mouth 2 (two) times daily.     . FUROSEMIDE 20  MG PO TABS  Take one tablet daily as needed for swelling 30 tablet 6  . FUROSEMIDE 40 MG PO TABS Oral Take 1 tablet (40 mg total) by mouth once. 1 tablet 0  . GABAPENTIN 300 MG PO CAPS Oral Take 300 mg by mouth 2 (two) times daily.      Marland Kitchen HYDROCHLOROTHIAZIDE 12.5 MG PO CAPS Oral Take 12.5 mg by mouth daily.    Marland Kitchen LOSARTAN POTASSIUM 100 MG PO TABS Oral Take 100 mg by mouth daily.      Marland Kitchen METFORMIN HCL 500 MG PO TABS Oral Take 500 mg by mouth 2 (two) times daily with a meal.      . METOPROLOL TARTRATE 100 MG PO TABS Oral Take 100 mg by mouth 2 (two) times daily.    Marland Kitchen PANTOPRAZOLE SODIUM 40 MG PO TBEC Oral Take 40 mg by mouth daily.      Allergies: Allergies as of 11/28/2011 - Review Complete 11/28/2011  Allergen Reaction Noted  . Codeine    . Codeine phosphate  08/10/2006  . Dutasteride    .  Feraheme (ferumoxytol) Swelling 11/15/2011   Past Medical History  Diagnosis Date  . HTN (hypertension)   . GERD (gastroesophageal reflux disease)     bravo pH study 2008  . Hyperlipidemia   . Hemorrhoids     external and internal  . Insomnia   . Allergic rhinitis   . OA (osteoarthritis)   . Hiatal hernia   . BPH (benign prostatic hypertrophy)   . Chronic cough   . Anemia 2009  . Diverticulosis of colon     on colonoscopy 2008  . Heart murmur   . Shortness of breath   . OSA (obstructive sleep apnea)     USES CPAP AS NEEDED  . Diabetes mellitus     type 2  . Headache   . IBS (irritable bowel syndrome)   . Periodontitis     chronic with bone loss  . Gout   . Asthma   . Depression     pt denies  . Cataract   . Hypercholesterolemia   . Chronic interstitial cystitis    Past Surgical History  Procedure Date  . Aortic valve replacement   . Nasal septoplasty w/ turbinoplasty   . Hernia repair   . Right knee arthroscopy   . Bunionectomy     right  . Cataract extraction 2009    &   2012    BILATERAL  . Esophagogastroduodenoscopy 08/30/2011    Procedure: ESOPHAGOGASTRODUODENOSCOPY (EGD);  Surgeon: Louis Meckel, MD;  Location: [facility] ENDOSCOPY;  Service: Endoscopy;  Laterality: N/A;  Rm 3005   . Colonoscopy 08/31/2011    Procedure: COLONOSCOPY;  Surgeon: Louis Meckel, MD;  Location: [facility] ENDOSCOPY;  Service: Endoscopy;  Laterality: N/A;  . Refractive surgery     bilateral   Family History  Problem Relation Age of Onset  . Heart disease Father   . Osteoarthritis Mother   . Hypertension Sister    History   Social History  . Marital Status: Married    Spouse Name: N/A    Number of Children: 0  . Years of Education: N/A   Occupational History  . retired    Social History Main Topics  . Smoking status: Former Smoker    Quit date: 09/30/1971  . Smokeless tobacco: Never Used  . Alcohol Use: No  . Drug Use: No  . Sexually Active: Not Currently   Other Topics  Concern  .  Not on file   Social History Narrative  . No narrative on file    Review of Systems: Pertinent items are noted in HPI.  Physical Exam: Blood pressure 127/73, pulse 55, temperature 97.8 F (36.6 C), temperature source Oral, resp. rate 16, SpO2 96.00%. BP 127/73  Pulse 55  Temp 97.8 F (36.6 C) (Oral)  Resp 16  SpO2 96%  General Appearance:    Alert, cooperative, no distress, appears stated age  Head:    Normocephalic, without obvious abnormality, atraumatic  Eyes:    PERRL, conjunctival pallor + EOM's intact, fundi    benign, both eyes       Ears:    Normal TM's and external ear canals, both ears  Nose:   Nares normal, septum midline, mucosa normal, no drainage    or sinus tenderness  Throat:   Lips, mucosa, and tongue normal; teeth and gums normal  Neck:   Supple, symmetrical, trachea midline, no adenopathy;       thyroid:  No enlargement/tenderness/nodules; no carotid   bruit or JVD  Back:     Symmetric, no curvature, ROM normal, no CVA tenderness  Lungs:     Clear to auscultation bilaterally, respirations unlabored  Chest wall:    No tenderness or deformity  Heart:    Regular rate and rhythm, 3/6 diastolic murmur hear in pulmonary and aortic area, S1 and S2 normal,  rub   or gallop  Abdomen:     Soft, non-tender, bowel sounds active all four quadrants,    no masses, no organomegaly  Genitalia:    Normal male without lesion, discharge or tenderness  Rectal:    Normal tone, normal prostate, no masses or tenderness;   guaiac negative stool  Extremities:   Extremities normal, atraumatic, no cyanosis or edema  Pulses:   2+ and symmetric all extremities  Skin:   Pale appearing, no rashes or lesions  Lymph nodes:   Cervical, supraclavicular, and axillary nodes normal  Neurologic:   CNII-XII intact. Normal strength, sensation and reflexes      throughout    Lab results: Basic Metabolic Panel:  Basename 11/28/11 0601  NA 132*  K 4.3  CL 98  CO2 27  GLUCOSE  135*  BUN 14  CREATININE 0.75  CALCIUM 9.6  MG --  PHOS --     Basename 11/28/11 0601  WBC 9.0  NEUTROABS 7.3  HGB 8.5*  HCT 27.0*  MCV 90.9  PLT 191   Cardiac Enzymes:  Basename 11/28/11 0602  CKTOTAL --  CKMB --  CKMBINDEX --  TROPONINI <0.30   BNP:  Basename 11/28/11 0602  PROBNP 2223.0*   D-Dimer:  Alvira Philips 11/28/11 0601  DDIMER 0.60*   Coagulation:  Basename 11/28/11 0601  LABPROT 15.2  INR 1.22   Urine Drug Screen: Drugs of Abuse  No results found for this basename: labopia, cocainscrnur, labbenz, amphetmu, thcu, labbarb    Alcohol Level: No results found for this basename: ETH:2 in the last 72 hours Urinalysis:  Basename 11/28/11 0612  COLORURINE YELLOW  LABSPEC 1.018  PHURINE 5.5  GLUCOSEU NEGATIVE  HGBUR TRACE*  BILIRUBINUR NEGATIVE  KETONESUR NEGATIVE  PROTEINUR 30*  UROBILINOGEN 1.0  NITRITE NEGATIVE  LEUKOCYTESUR NEGATIVE    Imaging results:  Dg Chest Port 1 View  11/28/2011  *RADIOLOGY REPORT*  Clinical Data: Syncope.  PORTABLE CHEST - 1 VIEW  Comparison: 08/31/2011.  Findings: Trachea is midline.  Heart is enlarged.  Lungs are low in volume with diffuse mixed interstitial  and air space disease.  No definite pleural fluid.  IMPRESSION: Low lung volumes with presumed pulmonary edema.   Original Report Authenticated By: Reyes Ivan, M.D.     Other results: EKG: normal sinus rhythm, HR ~60 bpm, LVH, T waves are more pronounced in anterior leads,compared with old EKG from 07/13.   Assessment & Plan by Problem: 66 year old gentleman with multiple admissions for syncope in the past, aortic valve replacement with porcine valve in 2006, iron deficiency anemia presents to the ER with another syncopal event on the morning of his admission.   #Syncope: Patient has multiple admissions for syncope in the past attributed to vasovagal versus polypharmacy who presents with another episode of syncope on the morning of his admission.  Differentials include cardiac causes (structural versus arrhythmogenic) versus neurological causes versus vasovagal versus medications.Etiology is unclear at this time. I think this could be vasovagal or related to his worsening anemia again but given his aortic valve replacement , would repeat 2 D echo to evaluate his aortic valve. He had a similar admission in 07/13  when he presented with Hb of 8.8. He was not orthostatic on exam. Neurological causes seems to be less likely in the absence of any focal neurological deficits on exam. He has slightly elevated pro- BNP which could be related to his anemia. His chest x-ray is also suggestive of findings consistent with pulmonary edema. His D-dimer is also slightly elevated but this could in turn be  related to his heart failure. Although he has a a history of travel but he is not hypoxic or tachycardic at this time. His well's score is 0, he falls under low prob of PE with revised [place] sore, therefore would hold off on CT - angio for now.  - Admit to telemetry bed. -Cycle cardiac enzymes x3 -Check TSH -Repeat EKG in a.m. -Repeat 2 D echo to assess his valves. - Continue metoprolol - Hold HCTZ and cozaar for  Now.    # Diabetes: Check AIC. His last AIC from 01/11 was 7.0.  - SSI  #Iron deficiency anemia; with iron of 23 and ferritin of 14 in 07/13. His baseline is around 9. He is currently close to his baseline with Hb- 8.5. Denies any hematemesis or melena. Status post IV iron infusion about 2 weeks ago( with Hb of 8.1). Etiology for his anemia is unclear. Status post multiple EGDs and colonoscopy last colonoscopy in 07/13 demonstrated hemorrhoids. Follows up with Dr. Leone Payor as an outpatient and was recently scheduled for capsule endoscopy that he refused to look for AVM's - Continue to monitor CBC. - daily protonix - Transfuse him 1 unit PRBC.   #Elevated proBNP: Likely  high output failure from his anemia. Chest x-ray also shows changes  consistent with pulmonary edema. Apparently patient developed dizziness and depression from Lasix that was introduced about a week or 2 ago. No evidence of pedal edema or elevated JVD on exam. - Hold HCTZ for now. - would give a dose of lasix with transfusion - NSL  #HYPERLIPIDEMIA-MIXED: Chek FLP. Continue home meds.   #HYPERTENSION: Blood pressure under control. Continue home meds-  Metoprolol. -Hold HCTZ and cozaar for now  # Hyponatremia: His sodium always runs around 131-132. He's currently at his baseline. -Continue to monitor for now  # DVT: SCD's    Signed: Laynee Lockamy 11/28/2011, 8:55 AM

## 2011-11-28 NOTE — ED Notes (Signed)
Admitting at bedside 

## 2011-11-28 NOTE — Progress Notes (Signed)
Utilization review completed.  

## 2011-11-28 NOTE — ED Provider Notes (Signed)
History     CSN: [id number]  Arrival date & time 11/28/11  2536   First MD Initiated Contact with Patient 11/28/11 0532      Chief Complaint  Patient presents with  . Chest Pain  . Nausea  . Headache  . Near Syncope    (Consider location/radiation/quality/duration/timing/severity/associated sxs/prior treatment) HPI 66 year old male presents to the emergency department complaining of syncopal event. Patient reports he woke up as usual around 2 AM with a headache. Patient got up, sat in his recliner, took a Tylenol and his metoprolol. Patient came back to bed around 4 AM, was sitting on the side of the bed when he had syncopal episode. Wife reports she woke to find him  "stiff as a board" lying across her legs. She reports his lips turned blue and she gave him CPR. She reports she gave him several breaths until he woke up, no chest compressions. She thinks he was unconscious for 6-7 minutes. No prior history of seizure. Patient and wife report long history of syncopal episodes, none requiring rescue breaths in the past. Most recent syncopal episode was July 22, at that time patient was found to be significantly anemic and thought to have GI bleed. He reports a syncopal episodes prior to that time were usually due to medications. He reports no missed medications, no change in his medications. He reports he had some tightness in his chest which he has had on and off for some time just prior to syncopal event. This tightness is in his right chest at the top lateral aspect of his sternum. He denies shortness of breath, diaphoresis, or radiation of the pain. After recurrent from a syncopal event, patient had nausea and vomiting. He reports this is not per his norm after having recovery from syncopal event. Patient reports this headache is still present. It is no different than his chronic daily headaches that he gets when he wakes from sleep at 2 AM.   Past Medical History  Diagnosis Date  . HTN  (hypertension)   . GERD (gastroesophageal reflux disease)     bravo pH study 2008  . Hyperlipidemia   . Hemorrhoids     external and internal  . Insomnia   . Allergic rhinitis   . OA (osteoarthritis)   . Hiatal hernia   . BPH (benign prostatic hypertrophy)   . Chronic cough   . Anemia 2009  . Diverticulosis of colon     on colonoscopy 2008  . Heart murmur   . Shortness of breath   . OSA (obstructive sleep apnea)     USES CPAP AS NEEDED  . Diabetes mellitus     type 2  . Headache   . IBS (irritable bowel syndrome)   . Periodontitis     chronic with bone loss  . Gout   . Asthma   . Depression     pt denies  . Cataract   . Hypercholesterolemia   . Chronic interstitial cystitis     Past Surgical History  Procedure Date  . Aortic valve replacement   . Nasal septoplasty w/ turbinoplasty   . Hernia repair   . Right knee arthroscopy   . Bunionectomy     right  . Cataract extraction 2009    &   2012    BILATERAL  . Esophagogastroduodenoscopy 08/30/2011    Procedure: ESOPHAGOGASTRODUODENOSCOPY (EGD);  Surgeon: Louis Meckel, MD;  Location: [facility] ENDOSCOPY;  Service: Endoscopy;  Laterality: N/A;  Rm 3005   .  Colonoscopy 08/31/2011    Procedure: COLONOSCOPY;  Surgeon: Louis Meckel, MD;  Location: [facility] ENDOSCOPY;  Service: Endoscopy;  Laterality: N/A;  . Refractive surgery     bilateral    Family History  Problem Relation Age of Onset  . Heart disease Father   . Osteoarthritis Mother   . Hypertension Sister     History  Substance Use Topics  . Smoking status: Former Smoker    Quit date: 09/30/1971  . Smokeless tobacco: Never Used  . Alcohol Use: No      Review of Systems  All other systems reviewed and are negative.    Allergies  Codeine; Codeine phosphate; Dutasteride; and Feraheme  Home Medications   Current Outpatient Rx  Name Route Sig Dispense Refill  . ACETAMINOPHEN 500 MG PO TABS Oral Take 500 mg by mouth every 6 (six) hours as needed. For  pain    . ASPIRIN 81 MG PO TABS Oral Take 81 mg by mouth daily.    Marland Kitchen CARBIDOPA-LEVODOPA 25-100 MG PO TABS Oral Take 1 tablet by mouth 2 (two) times daily.      Marland Kitchen CLONIDINE HCL 0.2 MG PO TABS Oral Take 0.2 mg by mouth 2 (two) times daily.    Marland Kitchen DOCUSATE SODIUM 100 MG PO CAPS Oral Take 100 mg by mouth as needed. constipation    . FINASTERIDE 5 MG PO TABS Oral Take 5 mg by mouth daily.     . OMEGA-3 FATTY ACIDS 1000 MG PO CAPS Oral Take 1 g by mouth 2 (two) times daily.     . FUROSEMIDE 20 MG PO TABS  Take one tablet daily as needed for swelling 30 tablet 6  . FUROSEMIDE 40 MG PO TABS Oral Take 1 tablet (40 mg total) by mouth once. 1 tablet 0  . GABAPENTIN 300 MG PO CAPS Oral Take 300 mg by mouth 2 (two) times daily.      Marland Kitchen HYDROCHLOROTHIAZIDE 12.5 MG PO CAPS Oral Take 12.5 mg by mouth daily.    Marland Kitchen LOSARTAN POTASSIUM 100 MG PO TABS Oral Take 100 mg by mouth daily.      Marland Kitchen METFORMIN HCL 500 MG PO TABS Oral Take 500 mg by mouth 2 (two) times daily with a meal.      . METOPROLOL TARTRATE 100 MG PO TABS Oral Take 100 mg by mouth 2 (two) times daily.    Marland Kitchen PANTOPRAZOLE SODIUM 40 MG PO TBEC Oral Take 40 mg by mouth daily.      BP 120/66  Pulse 55  Temp 97.8 F (36.6 C) (Oral)  Resp 18  SpO2 98%  Physical Exam  Nursing note and vitals reviewed. Constitutional: He is oriented to person, place, and time. He appears well-developed and well-nourished. No distress.  HENT:  Head: Normocephalic and atraumatic.  Nose: Nose normal.  Mouth/Throat: Oropharynx is clear and moist. No oropharyngeal exudate.  Eyes: Conjunctivae normal and EOM are normal. Pupils are equal, round, and reactive to light.  Neck: Normal range of motion. Neck supple. No JVD present. No tracheal deviation present. No thyromegaly present.  Cardiovascular: Normal rate and regular rhythm.  Exam reveals no gallop and no friction rub.   Murmur heard. Pulmonary/Chest: Effort normal and breath sounds normal. No stridor. No respiratory  distress. He has no wheezes. He has no rales. He exhibits no tenderness.  Abdominal: Soft. Bowel sounds are normal. He exhibits no distension and no mass. There is no tenderness. There is no rebound and no guarding.  Musculoskeletal:  Normal range of motion. He exhibits edema. He exhibits no tenderness.  Lymphadenopathy:    He has no cervical adenopathy.  Neurological: He is alert and oriented to person, place, and time. He exhibits normal muscle tone. Coordination normal.  Skin: Skin is warm and dry. No rash noted. He is not diaphoretic. No erythema. There is pallor.    ED Course  Procedures (including critical care time)  Labs Reviewed  URINALYSIS, ROUTINE W REFLEX MICROSCOPIC - Abnormal; Notable for the following:    APPearance CLOUDY (*)     Hgb urine dipstick TRACE (*)     Protein, ur 30 (*)     All other components within normal limits  CBC WITH DIFFERENTIAL - Abnormal; Notable for the following:    RBC 2.97 (*)     Hemoglobin 8.5 (*)     HCT 27.0 (*)     RDW 21.8 (*)     All other components within normal limits  URINE MICROSCOPIC-ADD ON - Abnormal; Notable for the following:    Bacteria, UA MANY (*)     All other components within normal limits  BASIC METABOLIC PANEL  PROTIME-INR  PRO B NATRIURETIC PEPTIDE  TROPONIN I   No results found.   Date: 11/28/2011  Rate: 54  Rhythm: normal sinus rhythm  QRS Axis: left  Intervals: normal  ST/T Wave abnormalities: nonspecific ST/T changes  Conduction Disutrbances:none  Narrative Interpretation: LVH  Old EKG Reviewed: unchanged    1. Syncope   2. Anemia       MDM  66 year old male with prolonged unconsciousness/syncopal event. We'll get labs, EKG, orthostatic vital signs. Will discuss with hospitalist for admission.        Olivia Mackie, MD 11/28/11 [phone]

## 2011-11-29 ENCOUNTER — Encounter (HOSPITAL_COMMUNITY): Payer: Self-pay | Admitting: Thoracic Surgery (Cardiothoracic Vascular Surgery)

## 2011-11-29 ENCOUNTER — Inpatient Hospital Stay (HOSPITAL_COMMUNITY): Payer: Medicare Other

## 2011-11-29 DIAGNOSIS — K089 Disorder of teeth and supporting structures, unspecified: Secondary | ICD-10-CM | POA: Diagnosis not present

## 2011-11-29 DIAGNOSIS — R55 Syncope and collapse: Secondary | ICD-10-CM | POA: Diagnosis not present

## 2011-11-29 DIAGNOSIS — I359 Nonrheumatic aortic valve disorder, unspecified: Secondary | ICD-10-CM

## 2011-11-29 LAB — URINALYSIS, ROUTINE W REFLEX MICROSCOPIC
Bilirubin Urine: NEGATIVE
Glucose, UA: NEGATIVE mg/dL
Hgb urine dipstick: NEGATIVE
Specific Gravity, Urine: 1.004 — ABNORMAL LOW (ref 1.005–1.030)
pH: 6.5 (ref 5.0–8.0)

## 2011-11-29 LAB — COMPREHENSIVE METABOLIC PANEL
ALT: <5 U/L (ref 0–53)
Alkaline Phosphatase: 56 U/L (ref 39–117)
BUN: 14 mg/dL (ref 6–23)
CO2: 29 mEq/L (ref 19–32)
Chloride: 95 mEq/L — ABNORMAL LOW (ref 96–112)
GFR calc Af Amer: >90 mL/min (ref >90)
GFR calc non Af Amer: >90 mL/min (ref >90)
Glucose, Bld: 148 mg/dL — ABNORMAL HIGH (ref 70–99)
Potassium: 3.6 mEq/L (ref 3.5–5.1)
Sodium: 135 mEq/L (ref 135–145)
Total Bilirubin: 1.4 mg/dL — ABNORMAL HIGH (ref 0.3–1.2)

## 2011-11-29 LAB — CBC
MCHC: 30.5 g/dL (ref 30.0–36.0)
Platelets: 208 10*3/uL (ref 150–400)
RDW: 21.8 % — ABNORMAL HIGH (ref 11.5–15.5)
WBC: 6.6 10*3/uL (ref 4.0–10.5)

## 2011-11-29 LAB — GLUCOSE, CAPILLARY: Glucose-Capillary: 128 mg/dL — ABNORMAL HIGH (ref 70–99)

## 2011-11-29 MED ORDER — OXYCODONE HCL 5 MG PO TABS
5.0000 mg | ORAL_TABLET | ORAL | Status: DC | PRN
  Administered 2011-11-29: 5 mg via ORAL
  Filled 2011-11-29: qty 1

## 2011-11-29 MED ORDER — ASPIRIN 81 MG PO CHEW
324.0000 mg | CHEWABLE_TABLET | ORAL | Status: AC
  Administered 2011-11-30: 324 mg via ORAL
  Filled 2011-11-29 (×2): qty 4

## 2011-11-29 MED ORDER — SODIUM CHLORIDE 0.9 % IJ SOLN: 3.0000 mL | Freq: Two times a day (BID) | INTRAMUSCULAR | Status: DC

## 2011-11-29 MED ORDER — SODIUM CHLORIDE 0.9 % IJ SOLN
3.0000 mL | Freq: Two times a day (BID) | INTRAMUSCULAR | Status: DC
  Administered 2011-11-29: 3 mL via INTRAVENOUS

## 2011-11-29 MED ORDER — ASPIRIN 81 MG PO CHEW: 324.0000 mg | CHEWABLE_TABLET | ORAL | Status: DC

## 2011-11-29 MED ORDER — SODIUM CHLORIDE 0.9 % IJ SOLN: 3.0000 mL | INTRAMUSCULAR | Status: DC | PRN

## 2011-11-29 MED ORDER — SODIUM CHLORIDE 0.9 % IV SOLN
INTRAVENOUS | Status: AC
  Administered 2011-11-29: 11:00:00 via INTRAVENOUS

## 2011-11-29 MED ORDER — DIAZEPAM 5 MG PO TABS: 5.0000 mg | ORAL_TABLET | ORAL | Status: DC

## 2011-11-29 MED ORDER — SODIUM CHLORIDE 0.9 % IV SOLN: 250.0000 mL | INTRAVENOUS | Status: DC | PRN

## 2011-11-29 MED ORDER — OXYCODONE HCL 5 MG PO TABS: 5.0000 mg | ORAL_TABLET | ORAL | Status: DC | PRN

## 2011-11-29 MED ORDER — FERUMOXYTOL INJECTION 510 MG/17 ML
510.0000 mg | INTRAVENOUS | Status: DC
  Filled 2011-11-29: qty 17

## 2011-11-29 MED ORDER — FUROSEMIDE 20 MG PO TABS
20.0000 mg | ORAL_TABLET | Freq: Every day | ORAL | Status: DC
  Administered 2011-11-29 – 2011-12-05 (×6): 20 mg via ORAL
  Filled 2011-11-29 (×9): qty 1

## 2011-11-29 NOTE — Progress Notes (Signed)
Pt states he does not wish to wear CPAP tonight. He says that he has a headache and the machine makes it worse. He has sleep apnea and states he does not wear machine at home and does not wish to wear it hear. He also complained of facial numbness and stated he did not want the mask on his face. RT explained importance of machine and the pt understands. PT states that he will talk to DR in AM. Nurse has been notified.

## 2011-11-29 NOTE — Progress Notes (Signed)
Subjective:    Interval Events:  Patient continues to have a headache but has had no dizziness or syncopal episodes.  The headaches have been an issue for him for years, but it has only been in the past few months that they have become more frequent.  They are now almost daily for the past month.  He cannot identify any provoking stimulus.  At first it seemed like they were worse in the morning and day, but now they are also bad in the evening.  The quality is described as "burning".  The pain does not radiate into the neck or face.  It is usually confined to the left frontoparietal area but they are occasionally bilaterally.  The range in severity from mild to severe 10 out of 10.  They last for hours.  They are associated with photophobia but no sensory deficits.    Objective:    Vital Signs:   Temp:  [97.4 F (36.3 C)-98.9 F (37.2 C)] 98.5 F (36.9 C) (10/22 0442) Pulse Rate:  [57-70] 70  (10/22 0442) Resp:  [18-22] 22  (10/22 0442) BP: (95-126)/(56-72) 110/72 mmHg (10/22 1039) SpO2:  [94 %-96 %] 94 % (10/22 0442) Weight:  [184 lb 11.9 oz (83.8 kg)] 184 lb 11.9 oz (83.8 kg) (10/22 0442) Last BM Date: 11/27/11   Weights: Filed Weights   11/28/11 1029 11/29/11 0442  Weight: 191 lb 6.4 oz (86.818 kg) 184 lb 11.9 oz (83.8 kg)   Net since admission:  -3kg   Intake/Output:   Intake/Output Summary (Last 24 hours) at 11/29/11 1414 Last data filed at 11/29/11 1139  Gross per 24 hour  Intake   1514 ml  Output   2975 ml  Net  -1461 ml     Net since admission:  -1.2L   Physical Exam: GENERAL:  alert and oriented; resting in bed in some distress LUNGS:  clear to auscultation bilaterally, normal work of breathing HEART:  normal rate; regular rhythm; normal S1 and S2, no S3 or S4 appreciated; no murmurs, rubs, or clicks ABDOMEN:  soft, non-tender, normal bowel sounds, no masses palpated EXTREMITIES:  trace edema SKIN:  normal turgor MOTOR:  grip strength is 5/5 bilaterally,  plantarflexion is 5/5 bilaterally. SENSATION:  decreased sensation in the left 4th and 5th fingers, sensation in the arms and legs is intact and equal bilaterally. CRANIAL NERVES:  pupils reactive to light bilaterally; extra occular muscles are intact; facial sensation is intact and equal bilaterally in V1, V2, and V3, and masseter and temporalis function is intact; forehead wrinkles symmetrically, orbicularis oculi strength is intact and equal bilaterally, and smile is symmetric; hearing is decreased on the left but Weber lateralized to right (conductive loss); uvula is midline and palate elevates symmetrically; trapezius and sternocleidomastoid strength is normal and equal bilaterally; tongue protrudes midline.    Labs: Basic Metabolic Panel:  Lab 11/29/11 4540 11/28/11 0601  NA 135 132*  K 3.6 4.3  CL 95* 98  CO2 29 27  GLUCOSE 148* 135*  BUN 14 14  CREATININE 0.81 0.75  CALCIUM 10.1 9.6  MG -- --  PHOS -- --   Liver Function Tests:  Lab 11/29/11 0615  AST 62*  ALT <5  ALKPHOS 56  BILITOT 1.4*  PROT 6.7  ALBUMIN 4.0   CBC:  Lab 11/29/11 0615 11/28/11 0601  WBC 6.6 9.0  NEUTROABS -- 7.3  HGB 8.7* 8.5*  HCT 28.5* 27.0*  MCV 92.5 90.9  PLT 208 191   Cardiac  Enzymes:  Lab 11/28/11 2212 11/28/11 1652 11/28/11 1056 11/28/11 0602  CKTOTAL -- -- -- --  CKMB -- -- -- --  CKMBINDEX -- -- -- --  TROPONINI <0.30 <0.30 <0.30 <0.30   CBG:  Lab 11/29/11 1332 11/29/11 0821 11/29/11 0439 11/29/11 0010 11/28/11 2108  GLUCAP 98 107* 105* 128* 122*     Medications:    Infusions:     Scheduled Medications:    . sodium chloride   Intravenous Weekly  . aspirin EC  81 mg Oral Daily  . carbidopa-levodopa  1 tablet Oral BID  . cloNIDine  0.2 mg Oral BID  . docusate sodium  100 mg Oral BID  . finasteride  5 mg Oral Daily  . furosemide  40 mg Intravenous Once  . furosemide  20 mg Oral Daily  . gabapentin  300 mg Oral BID  . insulin aspart  0-9 Units Subcutaneous Q4H  .  omega-3 acid ethyl esters  1 g Oral BID  . pantoprazole  40 mg Oral Daily  . sodium chloride  3 mL Intravenous Q12H  . DISCONTD: ferumoxytol  510 mg Intravenous Q7 days  . DISCONTD: metoprolol  100 mg Oral BID     PRN Medications: acetaminophen, acetaminophen, alum & mag hydroxide-simeth, ondansetron (ZOFRAN) IV, ondansetron, oxyCODONE    Assessment/ Plan:    1.   Syncope:  This is multifactorial.  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  This, coupled with 100mg  of metoprolol BID and chronic anemia with hemoglobins in the 8s has resulted in syncope.  Arrhythmias are unlikely since these episodes he has been having have not been sudden and there is always warning signs.  Seizure activity is less likely, as one would expect seizure-like activity and post-ictal confusion.  He will undergo left heart catheterization tomorrow to better visualize the aortic valve and cardiology will consult with cardiothoracic surgery. - stopped metoprolol - left heart catheterization tomorrow - consultation with cardiothoracic surgery   2.   Headache:  This too is likely multifactorial.  Parts of his history are consistent with morning headaches secondary to sleep apnea.  He has documented sleep apnea, but has refused to consistently use CPAP at night.  This does not, however, explain all of his headaches.  His history is consistent with migraine headaches or rebound headaches.  He may have a long history of tension headaches, for which he has self medicated with over the counter analgesia, leading now to rebound headaches.  Alternatively, he may be experiencing migraine headaches.  Photophobia and unilateral pain support this.  His history is not consistent with cluster headaches.  We will consider stopping NSAID and APAP therapy for these.  Ergotamines are probably not a good alternative treatment with his heart issues, but prochlorperazine with diphenhydramine may be a  reasonable alternative. - we will keep him on as needed oxycodone and acetaminophen overnight and alternatives tomorrow  3.   Iron deficiency anemia:  Patient is followed by Dr. Leone Payor for a gastrointestinal tract bleed that Dr. Leone Payor has been unable to identify.  The patient has previously refused capsule endoscopy.  In September, iron panel was consistent with iron deficiency.  An infusion of ferumoxytol was administered by Dr. Leone Payor, but the patient experienced lower extremity edema.  I have talked with hematology and they suggest any of the other iron preperations, either iron dextran or iron sucrose.  Alternatively, he may need a blood transfusion.  The patient was quite overwhelmed with all that  happened today, so we will proceed with left heart catheterization tomorrow and wait to hear from cardiothoracic surgery before treating the anemia.  - consider IV iron dextran, iron sucrose, or blood tranfusion  4.   Elevated proBNP:  This may represent new congestive heart failure, secondary to critical aortic stenosis.  Echocardiography demonstrated normal systolic function but critical aortic stenosis and grade I diastolic dysfunction.  Chest roentgenogram is also consistent with pulmonary edema and he has trace pedal edema.  According to his records, he experienced some edema and depression from oral furosemide that was prescribed around 2 weeks prior to admission.  He responded well to IV furosemide yesterday, so we will restart him on daily furosemide 20mg . - furosemide 20mg  daily  5.   Hypertension:  Blood pressure is under control currently.  At home, he takes clonidine 0.2mg  BID, HCTZ 12.5mg  daily, losartan 100mg  daily, and metoprolol 100mg  BID.  He was also recently started on furosemide 20mg  daily for peripheral edema.  We have stopped his metoprolol as was bradycardic and symptomatic with syncope.  We have also held his HCTZ and losartan.  We have restarted his furosemide at 20mg  daily and  kept him on clonidine 0.2mg  BID. - continue clonidine 0.2mg  BID - hold HCTZ and losartan - stopped metoprolol  6.   Diabetes:  Glucose has been well controlled here with minimal insulin.  We have him on a sensitive scale correctional insulin.  Last hemoglobin A1c is 7, it is currently 4.4 as a result of his anemia. - continue correctional insulin  7.   Dyslipidemia:  At home, he takes fish oil.  Fasting lipid panel is normal with an LDL of 50. - continue fish oil  8.   Hyponatremia:  Was low at 132 at admission and he has run low in the past around 131 to 132.  After a dose of IV furosemide yesterday, it has increased to 135.  9.   Prophylaxis: - SCDs for VTE prophylaxis - docusate BID for bowel regimen   Length of Stay: 1 days   Signed by:  Dorthula Rue. Earlene Plater, MD PGY-I, Internal Medicine Pager [phone]  11/29/2011, 2:14 PM

## 2011-11-29 NOTE — Progress Notes (Signed)
Subjective:  No further episodes of syncope. No chest pain. Complains of headache.  Objective:  Vital Signs in the last 24 hours: Temp:  [97.4 F (36.3 C)-98.9 F (37.2 C)] 98.5 F (36.9 C) (10/22 0442) Pulse Rate:  [57-70] 70  (10/22 0442) Resp:  [18-22] 22  (10/22 0442) BP: (95-132)/(56-74) 95/64 mmHg (10/22 0442) SpO2:  [94 %-100 %] 94 % (10/22 0442) Weight:  [184 lb 11.9 oz (83.8 kg)-191 lb 6.4 oz (86.818 kg)] 184 lb 11.9 oz (83.8 kg) (10/22 0442)  Intake/Output from previous day: 10/21 0701 - 10/22 0700 In: 1297 [P.O.:1291; I.V.:6] Out: 2850 [Urine:2850] Intake/Output from this shift: Total I/O In: 457 [P.O.:457] Out: 25 [Urine:25]     . sodium chloride   Intravenous Weekly  . aspirin EC  81 mg Oral Daily  . carbidopa-levodopa  1 tablet Oral BID  . cloNIDine  0.2 mg Oral BID  . docusate sodium  100 mg Oral BID  . ferumoxytol  510 mg Intravenous Q7 days  . finasteride  5 mg Oral Daily  . furosemide  40 mg Intravenous Once  . gabapentin  300 mg Oral BID  . insulin aspart  0-9 Units Subcutaneous Q4H  . omega-3 acid ethyl esters  1 g Oral BID  . pantoprazole  40 mg Oral Daily  . sodium chloride  3 mL Intravenous Q12H  . DISCONTD: aspirin  81 mg Oral Daily  . DISCONTD: fish oil-omega-3 fatty acids  1 g Oral BID  . DISCONTD: hydrochlorothiazide  12.5 mg Oral Daily  . DISCONTD: losartan  100 mg Oral Daily  . DISCONTD: metoprolol  100 mg Oral BID      Physical Exam: The patient appears to be in no distress. Skin color is pale.  Head and neck exam reveals that the pupils are equal and reactive. The extraocular movements are full. There is no scleral icterus. Mouth and pharynx are benign. No lymphadenopathy. There are bilateral carotid bruits transmitted from the heart. Carotid upstroke appears to be normal. The jugular venous pressure is normal. Thyroid is not enlarged or tender.  Chest is clear to percussion and auscultation. No rales or rhonchi. Expansion of the  chest is symmetrical.  Heart reveals a grade 3/6 harsh systolic ejection murmur loudest at the base and radiating to the neck. No gallop or rub  The abdomen is soft and nontender. Bowel sounds are normoactive. There is no hepatosplenomegaly or mass. There are no abdominal bruits.  Extremities reveal no phlebitis or edema. Pedal pulses are good. There is no cyanosis or clubbing.  Neurologic exam is normal strength and no lateralizing weakness. No sensory deficits.  Integument reveals no rash  Lab Results:  Basename 11/29/11 0615 11/28/11 0601  WBC 6.6 9.0  HGB 8.7* 8.5*  PLT 208 191    Basename 11/29/11 0615 11/28/11 0601  NA 135 132*  K 3.6 4.3  CL 95* 98  CO2 29 27  GLUCOSE 148* 135*  BUN 14 14  CREATININE 0.81 0.75    Basename 11/28/11 2212 11/28/11 1652  TROPONINI <0.30 <0.30   Hepatic Function Panel  Basename 11/29/11 0615  PROT 6.7  ALBUMIN 4.0  AST 62*  ALT <5  ALKPHOS 56  BILITOT 1.4*  BILIDIR --  IBILI --    Basename 11/28/11 1100  CHOL 105   No results found for this basename: PROTIME in the last 72 hours  Imaging: Dg Chest Port 1 View  11/28/2011  *RADIOLOGY REPORT*  Clinical Data: Syncope.  PORTABLE  CHEST - 1 VIEW  Comparison: 08/31/2011.  Findings: Trachea is midline.  Heart is enlarged.  Lungs are low in volume with diffuse mixed interstitial and air space disease.  No definite pleural fluid.  IMPRESSION: Low lung volumes with presumed pulmonary edema.   Original Report Authenticated By: Reyes Ivan, M.D.     Cardiac Studies: Telemetry reiviewed. NSR. No arrhythmia to explain syncope so far.  Assessment/Plan:  Patient Active Hospital Problem List: Syncope (08/29/2011)   Assessment: No further events.   Plan: Continue telemetry.  Await echo results ?consider further neuro w/u and EEG in view of persistent headaches ?    LOS: 1 day    Cassell Clement 11/29/2011, 10:15 AM

## 2011-11-29 NOTE — Progress Notes (Signed)
The echo shows critical aortic stenosis with fused immobile prosthetic valve leaflets and with peak gradient of 130 mm HG and mean gradient of 80 mm Hg. Carotid pulse is parvus et tardus. He will in all likelihood require a repeat aortic valve replacement. In view of his chronic iron deficiency anemia it will probably be another bioprosthetic valve to avoid the need for long term warfarin. I will put him on cath schedule for tomorrow.  I will ask cardiac surgeons to consult.

## 2011-11-29 NOTE — Plan of Care (Signed)
Problem: Phase I Progression Outcomes Goal: Voiding-avoid urinary catheter unless indicated Outcome: Not Applicable Date Met:  11/29/11 Patient voids with urinal

## 2011-11-29 NOTE — Consult Note (Signed)
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  The patient did well following this initial surgery until recently and he has been followed intermittently by Dr Jens Som for his valvular heart disease, who saw him most recently on 10/13/2010.  His most recent echocardiogram prior to this admission was in January of 2011 and notable for hyperdynamic LV systolic function with a mean transvalvular gradient across the aortic valve of 18 mm Hg. Other medical problems include hypertension, chronic iron deficient anemia with history of GI bleeding, GE reflux disease, obstructive sleep apnea, type II diabetes, urinary incontinence and arthritis.  The patient reports that over the past year he has developed gradual progression of exertional shortness of breath and orthopnea. In July he suffered a syncopal episode. This was associated with an episode of coffee ground emesis and he was noted to have baseline iron deficient anemia with hemoglobin 8.9 at the time of his admission. He was transfused 1 unit packed red blood cells and underwent both upper GI endoscopy and colonoscopy. No source for GI bleeding was identified. An echocardiogram was not performed during that hospitalization.  The patient has been followed since then by Dr. Leone Payor in the office in consideration has been made for possible capsule endoscopy.  Hemoglobin has remained essentially stable over the past month at 8.5.  Yesterday the patient was awoken from  his sleep at 2:30 in the morning because of a headache. Watch television for several hours but when he started to go back to bed he suffered another syncopal episode. He states that on both occasions he could feel something coming on although he denies feeling dizzy per se. He passed out and according to his wife was unresponsive for somewhere between 4 and 6 minutes. He did have urinary incontinence. He subsequently awoke and was brought to the emergency room. He has never had any chest pain.  EKG revealed sinus rhythm and troponins were negative. The patient underwent transthoracic echocardiogram demonstrating severe aortic stenosis with preserved left ventricular systolic function. The patient has been scheduled for cardiac catheterization and cardiothoracic surgical consultation has been requested.  The patient describes a one year history of progressive exertional shortness of breath. He denies resting shortness of breath. He denies any history of chest pain chest tightness chest pressure either with activity or at rest. He reports some episodes of PND and orthopnea. He has not had lower extremity edema.  Past Medical History  Diagnosis Date  . HTN (hypertension)   . GERD (gastroesophageal reflux disease)     bravo pH study 2008  . Hyperlipidemia   . Hemorrhoids     external and internal  . Insomnia   . Allergic rhinitis   . OA (osteoarthritis)   . Hiatal hernia   . BPH (benign prostatic hypertrophy)   . Chronic cough   . Anemia 2009  . Diverticulosis of colon     on colonoscopy 2008  . Heart murmur   . Shortness of breath   . OSA (obstructive sleep apnea)     USES CPAP  AS NEEDED  . Headache   . IBS (irritable bowel syndrome)   . Periodontitis     chronic with bone loss  . Gout   . Asthma   . Depression     pt denies  . Cataract   . Hypercholesterolemia   . Chronic interstitial cystitis   . Aortic stenosis   . H/O aortic valve replacement with porcine valve 2006  . Diabetes  mellitus     type 2    Past Surgical History  Procedure Date  . Aortic valve replacement 10/13/2004    25mm Edwards Perimount pericardial tissue valve  . Nasal septoplasty w/ turbinoplasty   . Hernia repair   . Right knee arthroscopy   . Bunionectomy     right  . Cataract extraction 2009    &   2012    BILATERAL  . Esophagogastroduodenoscopy 08/30/2011    Procedure: ESOPHAGOGASTRODUODENOSCOPY (EGD);  Surgeon: Louis Meckel, MD;  Location: [facility] ENDOSCOPY;  Service: Endoscopy;  Laterality: N/A;  Rm 3005   . Colonoscopy 08/31/2011    Procedure: COLONOSCOPY;  Surgeon: Louis Meckel, MD;  Location: [facility] ENDOSCOPY;  Service: Endoscopy;  Laterality: N/A;  . Refractive surgery     bilateral    Family History  Problem Relation Age of Onset  . Heart disease Father   . Osteoarthritis Mother   . Hypertension Sister   . Hyperlipidemia      Social History History  Substance Use Topics  . Smoking status: Former Smoker    Quit date: 09/30/1971  . Smokeless tobacco: Never Used  . Alcohol Use: No    Prior to Admission medications   Medication Sig Start Date End Date Taking? Authorizing Provider  acetaminophen (TYLENOL) 500 MG tablet Take 500 mg by mouth every 6 (six) hours as needed. For pain   Yes Historical Provider, MD  aspirin 81 MG tablet Take 81 mg by mouth daily.   Yes Historical Provider, MD  carbidopa-levodopa (SINEMET) 25-100 MG per tablet Take 1 tablet by mouth 2 (two) times daily.     Yes Historical Provider, MD  cloNIDine (CATAPRES) 0.2 MG tablet Take 0.2 mg by mouth 2 (two) times daily.   Yes Historical Provider, MD  docusate sodium (COLACE) 100 MG capsule Take 100 mg by mouth as needed. constipation   Yes Historical Provider, MD  finasteride (PROSCAR) 5 MG tablet Take 5 mg by mouth daily.    Yes Historical Provider, MD  fish oil-omega-3 fatty acids 1000 MG capsule Take 1 g by mouth 2 (two) times daily.    Yes Historical Provider, MD  furosemide (LASIX) 20 MG tablet Take  one tablet daily as needed for swelling 11/21/11  Yes Lewayne Bunting, MD  furosemide (LASIX) 40 MG tablet Take 1 tablet (40 mg total) by mouth once. 11/15/11  Yes Lewayne Bunting, MD  gabapentin (NEURONTIN) 300 MG capsule Take 300 mg by mouth 2 (two) times daily.     Yes Historical Provider, MD  hydrochlorothiazide (MICROZIDE) 12.5 MG capsule Take 12.5 mg by mouth daily.   Yes Historical Provider, MD  losartan (COZAAR) 100 MG tablet Take 100 mg by mouth daily.     Yes Historical Provider, MD  metFORMIN (GLUCOPHAGE) 500 MG tablet Take 500 mg by mouth 2 (two) times daily with a meal.     Yes Historical Provider, MD  metoprolol (LOPRESSOR) 100 MG tablet Take 100 mg by mouth 2 (two) times daily.   Yes Historical Provider, MD  pantoprazole (PROTONIX)  40 MG tablet Take 40 mg by mouth daily.   Yes Historical Provider, MD    Current Facility-Administered Medications  Medication Dose Route Frequency Provider Last Rate Last Dose  . 0.9 %  sodium chloride infusion   Intravenous Weekly Iva Boop, MD 20 mL/hr at 11/29/11 1036    . 0.9 %  sodium chloride infusion  250 mL Intravenous PRN Cassell Clement, MD      . 0.9 %  sodium chloride infusion  250 mL Intravenous PRN Cassell Clement, MD      . acetaminophen (TYLENOL) tablet 650 mg  650 mg Oral Q6H PRN Elyse Jarvis, MD   650 mg at 11/29/11 1344   Or  . acetaminophen (TYLENOL) suppository 650 mg  650 mg Rectal Q6H PRN Elyse Jarvis, MD      . alum & mag hydroxide-simeth (MAALOX/MYLANTA) 200-200-20 MG/5ML suspension 30 mL  30 mL Oral Q6H PRN Elyse Jarvis, MD      . aspirin chewable tablet 324 mg  324 mg Oral Pre-Cath Cassell Clement, MD      . aspirin EC tablet 81 mg  81 mg Oral Daily Kendra P Hiatt, PHARMD   81 mg at 11/29/11 1039  . carbidopa-levodopa (SINEMET IR) 25-100 MG per tablet immediate release 1 tablet  1 tablet Oral BID Elyse Jarvis, MD   1 tablet at 11/29/11 1039  . cloNIDine (CATAPRES) tablet 0.2 mg  0.2 mg Oral BID Elyse Jarvis, MD    0.2 mg at 11/29/11 1039  . diazepam (VALIUM) tablet 5 mg  5 mg Oral On Call Cassell Clement, MD      . docusate sodium (COLACE) capsule 100 mg  100 mg Oral BID Elyse Jarvis, MD   100 mg at 11/29/11 1039  . finasteride (PROSCAR) tablet 5 mg  5 mg Oral Daily Elyse Jarvis, MD   5 mg at 11/29/11 1039  . furosemide (LASIX) tablet 20 mg  20 mg Oral Daily Lollie Sails, MD   20 mg at 11/29/11 1344  . gabapentin (NEURONTIN) capsule 300 mg  300 mg Oral BID Elyse Jarvis, MD   300 mg at 11/29/11 1039  . insulin aspart (novoLOG) injection 0-9 Units  0-9 Units Subcutaneous Q4H Elyse Jarvis, MD   2 Units at 11/29/11 1740  . omega-3 acid ethyl esters (LOVAZA) capsule 1 g  1 g Oral BID Kendra P Hiatt, PHARMD   1 g at 11/29/11 1039  . ondansetron (ZOFRAN) tablet 4 mg  4 mg Oral Q6H PRN Elyse Jarvis, MD       Or  . ondansetron (ZOFRAN) injection 4 mg  4 mg Intravenous Q6H PRN Elyse Jarvis, MD      . oxyCODONE (Oxy IR/ROXICODONE) immediate release tablet 5-10 mg  5-10 mg Oral Q4H PRN Lollie Sails, MD      . pantoprazole (PROTONIX) EC tablet 40 mg  40 mg Oral Daily Elyse Jarvis, MD   40 mg at 11/29/11 1046  . sodium chloride 0.9 % injection 3 mL  3 mL Intravenous Q12H Elyse Jarvis, MD   3 mL at 11/29/11 1038  . sodium chloride 0.9 % injection 3 mL  3 mL Intravenous Q12H Cassell Clement, MD      . sodium chloride 0.9 % injection 3 mL  3 mL Intravenous PRN Cassell Clement, MD      . sodium chloride 0.9 % injection 3 mL  3 mL Intravenous Q12H Cassell Clement, MD      . sodium chloride 0.9 %  injection 3 mL  3 mL Intravenous PRN Cassell Clement, MD      . DISCONTD: aspirin chewable tablet 324 mg  324 mg Oral Pre-Cath Cassell Clement, MD      . DISCONTD: ferumoxytol [facility]) injection 510 mg  510 mg Intravenous Q7 days Iva Boop, MD      . DISCONTD: oxyCODONE (Oxy IR/ROXICODONE) immediate release tablet 5 mg  5 mg Oral Q4H PRN Lollie Sails, MD   5 mg at 11/29/11 1344    Allergies    Allergen Reactions  . Codeine   . Codeine Phosphate     REACTION: unspecified  . Dutasteride   . Feraheme (Ferumoxytol) Swelling    Pedal edema    Review of Systems:  General:  normal appetite, normal energy   Respiratory:  no cough, no wheezing, no hemoptysis, no pain with inspiration or cough, + exertional shortness of breath, has used CPAP at night in the past but not recently   Cardiac:   no chest pain or tightness, + exertional SOB, no resting SOB, + PND, + orthopnea, no LE edema, no palpitations, + syncope  GI:   no difficulty swallowing, + chronic reflux and heartburn, no hematochezia, no hematemesis, no melena, no constipation, + intermittent loose stools and IBS  GU:   no dysuria, + urgency, + frequency, + incontinence  Musculoskeletal: + arthritis especially lower back  Vascular:  no pain suggestive of claudication   Neuro:   no symptoms suggestive of TIA's, no seizures, + headaches, no peripheral neuropathy   Endocrine:  Checks CBGs at home regularly and reports good diabetes control  HEENT:  + loose teeth or painful teeth, hasn't seen a dentist in years, no recent vision changes  Psych:   no anxiety, no depression    Physical Exam:   BP 129/73  Pulse 71  Temp 98.2 F (36.8 C) (Oral)  Resp 18  Ht 5\' 9"  (1.753 m)  Wt 83.8 kg (184 lb 11.9 oz)  BMI 27.28 kg/m2  SpO2 96%  General:  Mildly obese  well-appearing  HEENT:  Unremarkable   Neck:   no JVD, no bruits, no adenopathy   Chest:   clear to auscultation, symmetrical breath sounds, no wheezes, no rhonchi, well healed median sternotomy  CV:   RRR, harsh crescendo/decrescendo systolic murmur best at RUSB  Abdomen:  soft, non-tender, no masses   Extremities:  warm, well-perfused, pulses palpable in groin  Rectal/GU  Deferred  Neuro:   Grossly non-focal and symmetrical throughout  Skin:   Clean and dry, no rashes, no breakdown  Diagnostic Tests:  Transthoracic Echocardiography  Patient: Koy, Lamp MR #:  [id number] Study Date: 11/29/2011 Gender: M Age: 28 Height: 175.3cm Weight: 83.8kg BSA: 2.39m^2 Pt. Status: Room: 4742  PERFORMING [facility] SONOGRAPHER Cathie Beams cc:  ------------------------------------------------------------ LV EF: 60% - 65%  ------------------------------------------------------------ Indications: Previous study 02/10/2009. Syncope 780.2.  ------------------------------------------------------------ History: PMH: Obstructive sleep apnea. Risk factors: Hypertension. Dyslipidemia.  ------------------------------------------------------------ Study Conclusions  - Left ventricle: Wall thickness was increased in a pattern of moderate LVH. There was moderate concentric hypertrophy. Systolic function was normal. The estimated ejection fraction was in the range of 60% to 65%. Wall motion was normal; there were no regional wall motion abnormalities. Doppler parameters are consistent with abnormal left ventricular relaxation (grade 1 diastolic dysfunction). - Aortic valve: Trivial regurgitation. Valve area: 0.65cm^2(VTI). Valve area: 0.59cm^2 (Vmax). Valve area: 0.55cm^2 (Vmean). Transthoracic echocardiography. M-mode, complete 2D, spectral Doppler, and color  Doppler. Height: Height: 175.3cm. Height: 69in. Weight: Weight: 83.8kg. Weight: 184.4lb. Body mass index: BMI: 27.3kg/m^2. Body surface area: BSA: 2.54m^2. Blood pressure: 110/72. Patient status: Inpatient. Location: Echo laboratory.  ------------------------------------------------------------  ------------------------------------------------------------ Left ventricle: Wall thickness was increased in a pattern of moderate LVH. There was moderate concentric hypertrophy. Systolic function was normal. The estimated ejection fraction was in the range of 60% to 65%. Wall motion was normal; there were no regional wall motion abnormalities. Doppler parameters  are consistent with abnormal left ventricular relaxation (grade 1 diastolic dysfunction).  ------------------------------------------------------------ Aortic valve: There is a known bovine aortic valve prosthesis. There is critical aortic stenosis with peak gradient 130 mm Hg and mean gradient of 80 mm Hg. The prosthetic valve leaflets show essentially no opening motion during systole. Doppler: Trivial regurgitation. VTI ratio of LVOT to aortic valve: 0.19. Valve area: 0.65cm^2(VTI). Indexed valve area: 0.32cm^2/m^2 (VTI). Peak velocity ratio of LVOT to aortic valve: 0.17. Valve area: 0.59cm^2 (Vmax). Indexed valve area: 0.29cm^2/m^2 (Vmax). Mean velocity ratio of LVOT to aortic valve: 0.16. Valve area: 0.55cm^2 (Vmean). Indexed valve area: 0.27cm^2/m^2 (Vmean). Mean gradient: 80mm Hg (S). Peak gradient: Hg (S).  ------------------------------------------------------------ Aorta: Aortic root: The aortic root was normal in size. Ascending aorta: The ascending aorta was normal in size.  ------------------------------------------------------------ Mitral valve: Mildly thickened leaflets . Doppler: No significant regurgitation. Peak gradient: 3mm Hg (D).  ------------------------------------------------------------ Left atrium: The atrium was at the upper limits of normal in size.  ------------------------------------------------------------ Right ventricle: The cavity size was normal. Wall thickness was normal. Systolic function was normal.  ------------------------------------------------------------ Pulmonic valve: Poorly visualized.  ------------------------------------------------------------ Tricuspid valve: Structurally normal valve. Leaflet separation was normal. Doppler: Transvalvular velocity was within the normal range. No significant regurgitation.  ------------------------------------------------------------ Right atrium: The atrium was normal in  size.  ------------------------------------------------------------ Pericardium: There was no pericardial effusion.  ------------------------------------------------------------  2D measurements Normal Doppler measurements Normal Left ventricle Left ventricle LVID ED, 43 mm 43-52 Ea, lat 8.19 cm/s ------ chord, ann, tiss PLAX DP LVID ES, 29 mm 23-38 E/Ea, lat 9.71 ------ chord, ann, tiss PLAX DP FS, chord, 33 % >29 Ea, med 5.46 cm/s ------ PLAX ann, tiss LVPW, ED 15 mm ------ DP IVS/LVPW 1.2 <1.3 E/Ea, med 14.5 ------ ratio, ED ann, tiss 6 Ventricular septum DP IVS, ED 18 mm ------ LVOT LVOT Peak vel, 97.8 cm/s ------ Diam, S 21 mm ------ S Area 3.46 cm^2 ------ Mean vel, 66.9 cm/s ------ Aorta S Root diam, 36 mm ------ VTI, S 29.3 cm ------ ED Aortic valve Left atrium Peak vel, 570 cm/s ------ AP dim 40 mm ------ S AP dim 1.97 cm/m^2 <2.2 Mean vel, 420 cm/s ------ index S VTI, S 155 cm ------ Mean 80 mm Hg ------ gradient, S Peak 130 mm Hg ------ gradient, S VTI ratio 0.19 ------ LVOT/AV Area, VTI 0.65 cm^2 ------ Area index 0.32 cm^2/m ------ (VTI) ^2 Peak vel 0.17 ------ ratio, LVOT/AV Area, Vmax 0.59 cm^2 ------ Area index 0.29 cm^2/m ------ (Vmax) ^2 Vmean 0.16 ------ ratio LVOT/AV Area, 0.55 cm^2 ------ Vmean Area index 0.27 cm^2/m ------ (Vmean) ^2 Mitral valve Peak E vel 79.5 cm/s ------ Peak A vel 85.7 cm/s ------ Decelerati 208 ms 150-23 on time 0 Peak 3 mm Hg ------ gradient, D Peak E/A 0.9 ------ ratio Right ventricle Sa vel, 14.8 cm/s ------ lat ann, tiss DP  ------------------------------------------------------------ Prepared and Electronically Authenticated by  Cassell Clement 2013-10-22T14:30:45.250   STS Risk Calculator  Procedure    Redo AVR  Risk of Mortality  3.0% Morbidity or Mortality  18% Prolonged LOS   7% Short LOS    39% Permanent Stroke   1.1% Prolonged Vent Support  12% DSW  Infection    0.2% Renal Failure    5% Reoperation    7%    Impression:  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  The patient has preserved left ventricular systolic function and presents with progressive symptoms of exertional shortness of breath with 2 syncopal episodes. Symptoms likely are substantially exacerbated by the presence of chronic severe iron deficient anemia. Risks associated with redo aortic valve replacement will be considerable and probably higher than that predicted by the STS risk calculator because of the previous replacement of the ascending aorta and possible need for aortic root replacement.  I doubt that operative risks will be insurmountable and given the patient's relatively young age I would tend to favor conventional redo aortic valve replacement rather than consideration for off label use of transcatheter aortic valve replacement.  However, the patient's coronary anatomy and pulmonary artery pressures have yet to be defined.     Plan:  I discussed matters at length with the patient and his wife this evening.  Alternative treatment strategies been discussed and all their questions been addressed.  I favor left and right heart catheterization with direct assessment of the patient's transvalvular gradient at catheterization and coronary arteriography.  In addition, I would favor catheterization via the radial artery or left femoral artery approach as right femoral or axillary artery cannulation may be necessary at the time of surgery do to the patient's previous aneurysm surgery.  Imaging of the descending thoracic and abdominal aorta would be useful to rule out significant aortoiliac occlusive disease which might preclude femoral artery cannulation for surgery.  The patient has poor dentition and needs a dental service consult.   Finally, the patient's underlying chronic anemia remains a problem and we'll need to be monitored carefully for signs of recurrent GI bleeding.  I will be out of town for the next 2 days and plan to followup the patient's cath results this Friday. We could potentially plan for surgery sometime next week depending upon his progress.     Salvatore Decent. Cornelius Moras, MD 11/29/2011 9:24 PM

## 2011-11-29 NOTE — Progress Notes (Addendum)
Internal Medicine Teaching Service Attending Note Date: 11/29/2011  Patient name: [name]  Medical record number: [id number]  Date of birth: 1945-06-12   I have seen and evaluated [name] and discussed their care with the Residency Team.  No further CP.  Continued headache.     . sodium chloride   Intravenous Weekly  . aspirin EC  81 mg Oral Daily  . carbidopa-levodopa  1 tablet Oral BID  . cloNIDine  0.2 mg Oral BID  . docusate sodium  100 mg Oral BID  . ferumoxytol  510 mg Intravenous Q7 days  . finasteride  5 mg Oral Daily  . furosemide  40 mg Intravenous Once  . gabapentin  300 mg Oral BID  . insulin aspart  0-9 Units Subcutaneous Q4H  . omega-3 acid ethyl esters  1 g Oral BID  . pantoprazole  40 mg Oral Daily  . sodium chloride  3 mL Intravenous Q12H  . DISCONTD: aspirin  81 mg Oral Daily  . DISCONTD: fish oil-omega-3 fatty acids  1 g Oral BID  . DISCONTD: hydrochlorothiazide  12.5 mg Oral Daily  . DISCONTD: losartan  100 mg Oral Daily  . DISCONTD: metoprolol  100 mg Oral BID    Physical Exam: Blood pressure 95/64, pulse 70, temperature 98.5 F (36.9 C), temperature source Oral, resp. rate 22, height 5\' 9"  (1.753 m), weight 83.8 kg (184 lb 11.9 oz), SpO2 94.00%. General appearance: alert, cooperative and no distress Resp: clear to auscultation bilaterally Cardio: regular rate and rhythm and systolic murmur: systolic ejection 3/6, crescendo at 2nd right intercostal space GI: normal findings: bowel sounds normal and soft, non-tender  Lab results: Results for orders placed during the hospital encounter of 11/28/11 (from the past 24 hour(s))  HEMOGLOBIN A1C     Status: Normal   Collection Time   11/28/11 10:56 AM      Component Value Range   Hemoglobin A1C 4.4  <5.7 %   Mean Plasma Glucose 80  <117 mg/dL  TSH     Status: Normal   Collection Time   11/28/11 10:56 AM      Component Value Range   TSH 1.233  0.350 - 4.500 uIU/mL  TROPONIN I     Status:  Normal   Collection Time   11/28/11 10:56 AM      Component Value Range   Troponin I <0.30  <0.30 ng/mL  LIPID PANEL     Status: Normal   Collection Time   11/28/11 11:00 AM      Component Value Range   Cholesterol 105  0 - 200 mg/dL   Triglycerides 77  <782 mg/dL   HDL 40  >95 mg/dL   Total CHOL/HDL Ratio 2.6     VLDL 15  0 - 40 mg/dL   LDL Cholesterol 50  0 - 99 mg/dL  PREPARE RBC (CROSSMATCH)     Status: Normal   Collection Time   11/28/11 11:40 AM      Component Value Range   Order Confirmation ORDER PROCESSED BY BLOOD BANK    TYPE AND SCREEN     Status: Normal (Preliminary result)   Collection Time   11/28/11 11:40 AM      Component Value Range   ABO/RH(D) A NEG     Antibody Screen NEG     Sample Expiration 12/01/2011     Unit Number [id number]     Blood Component Type RED CELLS,LR     Unit division 00  Status of Unit ALLOCATED     Transfusion Status OK TO TRANSFUSE     Crossmatch Result Compatible    GLUCOSE, CAPILLARY     Status: Abnormal   Collection Time   11/28/11 11:59 AM      Component Value Range   Glucose-Capillary 108 (*) 70 - 99 mg/dL   Comment 1 Notify RN    GLUCOSE, CAPILLARY     Status: Abnormal   Collection Time   11/28/11  4:22 PM      Component Value Range   Glucose-Capillary 124 (*) 70 - 99 mg/dL   Comment 1 Notify RN    TROPONIN I     Status: Normal   Collection Time   11/28/11  4:52 PM      Component Value Range   Troponin I <0.30  <0.30 ng/mL  GLUCOSE, CAPILLARY     Status: Abnormal   Collection Time   11/28/11  9:08 PM      Component Value Range   Glucose-Capillary 122 (*) 70 - 99 mg/dL   Comment 1 Notify RN    TROPONIN I     Status: Normal   Collection Time   11/28/11 10:12 PM      Component Value Range   Troponin I <0.30  <0.30 ng/mL  GLUCOSE, CAPILLARY     Status: Abnormal   Collection Time   11/29/11 12:10 AM      Component Value Range   Glucose-Capillary 128 (*) 70 - 99 mg/dL   Comment 1 Notify RN    GLUCOSE,  CAPILLARY     Status: Abnormal   Collection Time   11/29/11  4:39 AM      Component Value Range   Glucose-Capillary 105 (*) 70 - 99 mg/dL   Comment 1 Notify RN    COMPREHENSIVE METABOLIC PANEL     Status: Abnormal   Collection Time   11/29/11  6:15 AM      Component Value Range   Sodium 135  135 - 145 mEq/L   Potassium 3.6  3.5 - 5.1 mEq/L   Chloride 95 (*) 96 - 112 mEq/L   CO2 29  19 - 32 mEq/L   Glucose, Bld 148 (*) 70 - 99 mg/dL   BUN 14  6 - 23 mg/dL   Creatinine, Ser 7.82  0.50 - 1.35 mg/dL   Calcium 95.6  8.4 - 21.3 mg/dL   Total Protein 6.7  6.0 - 8.3 g/dL   Albumin 4.0  3.5 - 5.2 g/dL   AST 62 (*) 0 - 37 U/L   ALT <5  0 - 53 U/L   Alkaline Phosphatase 56  39 - 117 U/L   Total Bilirubin 1.4 (*) 0.3 - 1.2 mg/dL   GFR calc non Af Amer >90  >90 mL/min   GFR calc Af Amer >90  >90 mL/min  CBC     Status: Abnormal   Collection Time   11/29/11  6:15 AM      Component Value Range   WBC 6.6  4.0 - 10.5 K/uL   RBC 3.08 (*) 4.22 - 5.81 MIL/uL   Hemoglobin 8.7 (*) 13.0 - 17.0 g/dL   HCT 08.6 (*) 57.8 - 46.9 %   MCV 92.5  78.0 - 100.0 fL   MCH 28.2  26.0 - 34.0 pg   MCHC 30.5  30.0 - 36.0 g/dL   RDW 62.9 (*) 52.8 - 41.3 %   Platelets 208  150 - 400 K/uL  GLUCOSE, CAPILLARY  Status: Abnormal   Collection Time   11/29/11  8:21 AM      Component Value Range   Glucose-Capillary 107 (*) 70 - 99 mg/dL   Comment 1 Notify RN      Imaging results:  Dg Chest Port 1 View  11/28/2011  *RADIOLOGY REPORT*  Clinical Data: Syncope.  PORTABLE CHEST - 1 VIEW  Comparison: 08/31/2011.  Findings: Trachea is midline.  Heart is enlarged.  Lungs are low in volume with diffuse mixed interstitial and air space disease.  No definite pleural fluid.  IMPRESSION: Low lung volumes with presumed pulmonary edema.   Original Report Authenticated By: Reyes Ivan, M.D.     Assessment and Plan: I agree with the formulated Assessment and Plan with the following changes:   Recurrent Syncopal  episodes  CE's (-)  F/u tele monitoring, no events noted in chart   AVR (porcine)  Await TTE  Will check BCx  Appreciate Dr Yevonne Pax f/u  Anemia  follow up h/h  Headache  Consider imaging of head

## 2011-11-29 NOTE — Progress Notes (Signed)
  Echocardiogram 2D Echocardiogram has been performed.  [name] 11/29/2011, 1:15 PM

## 2011-11-30 ENCOUNTER — Encounter (HOSPITAL_COMMUNITY)
Admission: EM | Disposition: A | Discharge status: Home / Self Care | Payer: Self-pay | Source: Home / Self Care | Attending: Infectious Diseases

## 2011-11-30 ENCOUNTER — Ambulatory Visit: Payer: Medicare Other | Admitting: Internal Medicine

## 2011-11-30 ENCOUNTER — Encounter (HOSPITAL_COMMUNITY): Payer: Medicare Other

## 2011-11-30 ENCOUNTER — Inpatient Hospital Stay (HOSPITAL_COMMUNITY): Payer: Medicare Other

## 2011-11-30 ENCOUNTER — Other Ambulatory Visit: Payer: Self-pay

## 2011-11-30 ENCOUNTER — Encounter (HOSPITAL_COMMUNITY): Payer: Self-pay | Admitting: Dentistry

## 2011-11-30 DIAGNOSIS — I517 Cardiomegaly: Secondary | ICD-10-CM | POA: Diagnosis not present

## 2011-11-30 DIAGNOSIS — R51 Headache: Secondary | ICD-10-CM

## 2011-11-30 DIAGNOSIS — Z954 Presence of other heart-valve replacement: Secondary | ICD-10-CM

## 2011-11-30 DIAGNOSIS — I350 Nonrheumatic aortic (valve) stenosis: Secondary | ICD-10-CM | POA: Diagnosis present

## 2011-11-30 DIAGNOSIS — D509 Iron deficiency anemia, unspecified: Secondary | ICD-10-CM | POA: Diagnosis not present

## 2011-11-30 DIAGNOSIS — I359 Nonrheumatic aortic valve disorder, unspecified: Secondary | ICD-10-CM | POA: Diagnosis not present

## 2011-11-30 DIAGNOSIS — I059 Rheumatic mitral valve disease, unspecified: Secondary | ICD-10-CM

## 2011-11-30 DIAGNOSIS — I10 Essential (primary) hypertension: Secondary | ICD-10-CM

## 2011-11-30 DIAGNOSIS — R55 Syncope and collapse: Secondary | ICD-10-CM | POA: Diagnosis not present

## 2011-11-30 DIAGNOSIS — Z01811 Encounter for preprocedural respiratory examination: Secondary | ICD-10-CM | POA: Diagnosis not present

## 2011-11-30 DIAGNOSIS — E119 Type 2 diabetes mellitus without complications: Secondary | ICD-10-CM

## 2011-11-30 HISTORY — PX: RIGHT HEART CATHETERIZATION: SHX5447

## 2011-11-30 HISTORY — PX: LEFT HEART CATHETERIZATION WITH CORONARY ANGIOGRAM: SHX5451

## 2011-11-30 LAB — COMPREHENSIVE METABOLIC PANEL
ALT: <5 U/L (ref 0–53)
AST: 52 U/L — ABNORMAL HIGH (ref 0–37)
CO2: 28 mEq/L (ref 19–32)
Chloride: 97 mEq/L (ref 96–112)
GFR calc Af Amer: >90 mL/min (ref >90)
GFR calc non Af Amer: >90 mL/min (ref >90)
Glucose, Bld: 106 mg/dL — ABNORMAL HIGH (ref 70–99)
Sodium: 135 mEq/L (ref 135–145)
Total Bilirubin: 1.3 mg/dL — ABNORMAL HIGH (ref 0.3–1.2)

## 2011-11-30 LAB — GLUCOSE, CAPILLARY
Glucose-Capillary: 101 mg/dL — ABNORMAL HIGH (ref 70–99)
Glucose-Capillary: 143 mg/dL — ABNORMAL HIGH (ref 70–99)

## 2011-11-30 LAB — CBC
Hemoglobin: 8.7 g/dL — ABNORMAL LOW (ref 13.0–17.0)
MCH: 28.6 pg (ref 26.0–34.0)
MCV: 91.1 fL (ref 78.0–100.0)
RBC: 3.04 MIL/uL — ABNORMAL LOW (ref 4.22–5.81)

## 2011-11-30 LAB — POCT I-STAT 3, ART BLOOD GAS (G3+): Acid-Base Excess: 2 mmol/L (ref 0.0–2.0)

## 2011-11-30 LAB — POCT ACTIVATED CLOTTING TIME: Activated Clotting Time: 164 seconds

## 2011-11-30 LAB — POCT I-STAT 3, VENOUS BLOOD GAS (G3P V)
Acid-Base Excess: 3 mmol/L — ABNORMAL HIGH (ref 0.0–2.0)
pCO2, Ven: 41.6 mmHg — ABNORMAL LOW (ref 45.0–50.0)
pH, Ven: 7.432 — ABNORMAL HIGH (ref 7.250–7.300)
pO2, Ven: 36 mmHg (ref 30.0–45.0)

## 2011-11-30 SURGERY — LEFT HEART CATHETERIZATION WITH CORONARY ANGIOGRAM
Anesthesia: LOCAL | Site: Groin

## 2011-11-30 MED ORDER — VERAPAMIL HCL 2.5 MG/ML IV SOLN
INTRAVENOUS | Status: AC
  Filled 2011-11-30: qty 2

## 2011-11-30 MED ORDER — HEPARIN (PORCINE) IN NACL 2-0.9 UNIT/ML-% IJ SOLN
INTRAMUSCULAR | Status: AC
  Filled 2011-11-30: qty 1000

## 2011-11-30 MED ORDER — MIDAZOLAM HCL 2 MG/2ML IJ SOLN
INTRAMUSCULAR | Status: AC
  Filled 2011-11-30: qty 2

## 2011-11-30 MED ORDER — CLONIDINE HCL 0.1 MG PO TABS
0.1000 mg | ORAL_TABLET | Freq: Two times a day (BID) | ORAL | Status: DC
  Administered 2011-11-30 – 2011-12-05 (×12): 0.1 mg via ORAL
  Filled 2011-11-30 (×15): qty 1

## 2011-11-30 MED ORDER — FENTANYL CITRATE 0.05 MG/ML IJ SOLN
INTRAMUSCULAR | Status: AC
  Filled 2011-11-30: qty 2

## 2011-11-30 MED ORDER — SENNOSIDES-DOCUSATE SODIUM 8.6-50 MG PO TABS
1.0000 | ORAL_TABLET | Freq: Two times a day (BID) | ORAL | Status: DC
  Administered 2011-11-30 – 2011-12-02 (×4): 1 via ORAL
  Filled 2011-11-30 (×5): qty 1

## 2011-11-30 MED ORDER — LIDOCAINE HCL (PF) 1 % IJ SOLN
INTRAMUSCULAR | Status: AC
  Filled 2011-11-30: qty 30

## 2011-11-30 MED ORDER — SODIUM CHLORIDE 0.9 % IV SOLN
INTRAVENOUS | Status: AC
  Administered 2011-11-30: 10:00:00 via INTRAVENOUS

## 2011-11-30 MED ORDER — CHLORHEXIDINE GLUCONATE 0.12 % MT SOLN
15.0000 mL | Freq: Two times a day (BID) | OROMUCOSAL | Status: DC
  Administered 2011-11-30 – 2011-12-05 (×11): 15 mL via OROMUCOSAL
  Filled 2011-11-30 (×15): qty 15

## 2011-11-30 MED ORDER — TRAZODONE HCL 50 MG PO TABS
50.0000 mg | ORAL_TABLET | Freq: Every day | ORAL | Status: DC
Start: 2011-11-30 — End: 2011-12-01
  Administered 2011-11-30: 50 mg via ORAL
  Filled 2011-11-30 (×2): qty 1

## 2011-11-30 MED ORDER — CLONIDINE HCL 0.1 MG PO TABS: 0.1000 mg | ORAL_TABLET | Freq: Two times a day (BID) | ORAL | Status: DC

## 2011-11-30 MED ORDER — NITROGLYCERIN 0.2 MG/ML ON CALL CATH LAB
INTRAVENOUS | Status: AC
  Filled 2011-11-30: qty 1

## 2011-11-30 NOTE — Progress Notes (Signed)
Subjective:    Interval Events:  Did not sleep well last night because of anxiety and frequent urination.  No other events. Denies headache currently, but still feels a "tightness" over left face.  Left leg is bothering him with restless leg.  No dizziness, near-syncope, chest pain, or dyspnea.  Last BM was Sunday.    Objective:    Vital Signs:   Temp:  [97.8 F (36.6 C)-98.2 F (36.8 C)] 97.9 F (36.6 C) (10/23 0609) Pulse Rate:  [69-81] 81  (10/23 0609) Resp:  [18-20] 18  (10/23 0609) BP: (90-129)/(57-73) 121/68 mmHg (10/23 0609) SpO2:  [95 %-96 %] 96 % (10/23 0609) Weight:  [184 lb 8 oz (83.689 kg)] 184 lb 8 oz (83.689 kg) (10/23 0609) Last BM Date: 11/27/11   Weights: 24-hour Weight change: -6 lb 14.4 oz (-3.13 kg)  Filed Weights   11/28/11 1029 11/29/11 0442 11/30/11 0609  Weight: 191 lb 6.4 oz (86.818 kg) 184 lb 11.9 oz (83.8 kg) 184 lb 8 oz (83.689 kg)   Net since admission:  -3.1kg   Intake/Output:   Intake/Output Summary (Last 24 hours) at 11/30/11 0744 Last data filed at 11/30/11 0130  Gross per 24 hour  Intake   1876 ml  Output   1775 ml  Net    101 ml     Net since admission:  -1.5L    Physical Exam: GENERAL:  alert and oriented; resting comfortably in bed and in no distress EYES:  pupils equal, round, and reactive to light; sclera anicteric; EOMI ENT:  moist mucosa LUNGS:  clear to auscultation bilaterally, normal work of breathing HEART:  normal rate; regular rhythm; normal S1 and S2, no S3 or S4 appreciated; no murmurs, rubs, or clicks ABDOMEN:  soft, non-tender, normal bowel sounds, no masses palpated EXTREMITIES:  no edema SKIN:  normal turgor    Labs: Basic Metabolic Panel:  Lab 11/30/11 4540 11/29/11 0615 11/28/11 0601  NA 135 135 132*  K 3.5 3.6 4.3  CL 97 95* 98  CO2 28 29 27   GLUCOSE 106* 148* 135*  BUN 10 14 14   CREATININE 0.72 0.81 0.75  CALCIUM 9.9 10.1 9.6  MG -- -- --  PHOS -- -- --   Liver Function Tests:  Lab  11/30/11 0635 11/29/11 0615  AST 52* 62*  ALT <5 <5  ALKPHOS 54 56  BILITOT 1.3* 1.4*  PROT 6.3 6.7  ALBUMIN 3.8 4.0   CBC:  Lab 11/30/11 0635 11/29/11 0615 11/28/11 0601  WBC 5.9 6.6 9.0  NEUTROABS -- -- 7.3  HGB 8.7* 8.7* 8.5*  HCT 27.7* 28.5* 27.0*  MCV 91.1 92.5 90.9  PLT 213 208 191   CBG:  Lab 11/30/11 0501 11/30/11 0046 11/29/11 1958 11/29/11 1626 11/29/11 1332  GLUCAP 104* 101* 108* 166* 98    Other results: Imaging: Transthoracic Echocardiography 11/29/2011 Left ventricle: Wall thickness was increased in a pattern of moderate LVH. There was moderate concentric hypertrophy. Systolic function was normal. The estimated ejection fraction was in the range of 60% to 65%. Wall motion was normal; there were no regional wall motion abnormalities. Doppler parameters are consistent with abnormal left ventricular relaxation (grade 1 diastolic dysfunction). - Aortic valve: Trivial regurgitation.  Critical stenosis. Valve area: 0.65cm^2(VTI). Valve area: 0.59cm^2 (Vmax). Valve area: 0.55cm^2 (Vmean).  Heart Catheterization 11/30/2011 1. No angiographic evidence of CAD  2. 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 (110 mm peak to peak gradient with AVA of 0.94)    Medications:  Infusions:     Scheduled Medications:    . sodium chloride   Intravenous Weekly  . aspirin  324 mg Oral Pre-Cath  . aspirin EC  81 mg Oral Daily  . carbidopa-levodopa  1 tablet Oral BID  . cloNIDine  0.2 mg Oral BID  . diazepam  5 mg Oral On Call  . docusate sodium  100 mg Oral BID  . finasteride  5 mg Oral Daily  . furosemide  20 mg Oral Daily  . gabapentin  300 mg Oral BID  . insulin aspart  0-9 Units Subcutaneous Q4H  . omega-3 acid ethyl esters  1 g Oral BID  . pantoprazole  40 mg Oral Daily  . sodium chloride  3 mL Intravenous Q12H  . sodium chloride  3 mL Intravenous Q12H  . sodium chloride  3 mL Intravenous Q12H  . DISCONTD: aspirin  324 mg Oral Pre-Cath  .  DISCONTD: ferumoxytol  510 mg Intravenous Q7 days     PRN Medications: sodium chloride, sodium chloride, acetaminophen, acetaminophen, alum & mag hydroxide-simeth, ondansetron (ZOFRAN) IV, ondansetron, oxyCODONE, sodium chloride, sodium chloride, DISCONTD: oxyCODONE    Assessment/ Plan:     1.   Syncope:  This is multifactorial.  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  This, coupled with 100mg  of metoprolol BID and chronic anemia with hemoglobins in the 8s has resulted in syncope.  Arrhythmias are unlikely since these episodes he has been having have not been sudden and there is always warning signs.  Seizure activity is less likely, as one would expect seizure-like activity and post-ictal confusion. Cardiothoracic surgery has seen the patient and will follow. - stopped metoprolol - need recs from CTS regarding valve revision   2.   Chronic daily headache:  This too is likely multifactorial.  Parts of his history are consistent with morning headaches secondary to sleep apnea.  He has documented sleep apnea, but has refused to consistently use CPAP at night.  This does not, however, explain all of his headaches.  His history is consistent with migraine headaches and rebound headaches.  He may have a long history of tension headaches, for which he has self medicated with over the counter analgesia, leading now to rebound headaches; indeed he reports frequent acetaminophen use over the past few weeks.  Alternatively, he may be experiencing migraine headaches.  Photophobia and unilateral pain support this.  His history is not consistent with cluster headaches.  Additionally, he has not had a headaches since this morning; this morning he did not receive his clonidine and his blood pressures are in the 140s systolic now.  Perhaps clonidine and relative hypotension is contributing.  We will stop APAP and oxycodone.  Ergotamines  are probably not a good alternative treatment with his heart issues, but prochlorperazine (5mg  every 6 hours) with diphenhydramine (12.5mg  every 6 hours) may be a reasonable alternative.  We will also taper the clonidine dose to 0.1mg  BID. - patient has no PRN analgesics ordered, will try 5mg  prochlorperazine + 12.5mg  diphenhydramine if headache recurs - taper clonidine  3.   Iron deficiency anemia:  Patient is followed by Dr. Leone Payor for a gastrointestinal tract bleed that Dr. Leone Payor has been unable to identify.  The patient has previously refused capsule endoscopy.  In September, iron panel was consistent with iron deficiency.  An infusion of ferumoxytol was administered by Dr. Leone Payor, but the patient experienced lower extremity edema.  I have talked with hematology  and they suggest any of the other iron preperations, either iron dextran or iron sucrose.  Alternatively, he may need a blood transfusion.  We will wait to hear from cardiothoracic surgery before treating the anemia. - consider IV iron dextran, iron sucrose, or blood tranfusion  4.   Elevated proBNP:  This may represent new congestive heart failure, secondary to critical aortic stenosis.  Echocardiography demonstrated normal systolic function but critical aortic stenosis and grade I diastolic dysfunction.  Chest roentgenogram is also consistent with pulmonary edema and he has trace pedal edema.  According to his records, he experienced some edema and depression from oral furosemide that was prescribed around 2 weeks prior to admission.  He responded well to IV furosemide on 10/21, so we have restarted him on daily furosemide 20mg . - furosemide 20mg  daily  5.   Hypertension:  Blood pressure is under control currently.  At home, he takes clonidine 0.2mg  BID, HCTZ 12.5mg  daily, losartan 100mg  daily, and metoprolol 100mg  BID.  He was also recently started on furosemide 20mg  daily for peripheral edema.  We have stopped his metoprolol as was  bradycardic and symptomatic with syncope.  We have also held his HCTZ and losartan.  We have restarted his furosemide at 20mg  daily.  We will taper his clonidine dose to 0.1mg  - decrease clonidine to 0.1mg  BID - hold HCTZ and losartan - stopped metoprolol  6.   Diabetes:  Glucose has been well controlled here with minimal insulin.  Last hemoglobin A1c is 7, it is currently 4.4 as a result of his anemia.  We will stop correctional insulin and CBG checks. - stop correctional insulin  7.   Dyslipidemia:  At home, he takes fish oil.  Fasting lipid panel is normal with an LDL of 50. - continue fish oil  8.   Hyponatremia:  Was low at 132 at admission and he has run low in the past around 131 to 132.  After a dose of IV furosemide yesterday, it has increased to 135.  9.   Restless leg syndrome:  Clonidine may have been prescribed for restless leg syndrome rather than hypertension.  After a held dose of clonidine this morning, his restless leg syndrome is worse.  His blood pressure have been running low-normal, so we will restart the clonidine this afternoon at 0.1mg  BID.  He is also on carbidopa-levodopa and gabapentin for this, which was also held this morning. - continue carbidopa-levodopa - continue gabapentin - taper clonidine, monitor for worsened RLS  10. Prophylaxis: - SCDs for VTE prophylaxis - senna-docusate BID for bowel regimen    Length of Stay: 2 days   Signed by:  Dorthula Rue. Earlene Plater, MD PGY-I, Internal Medicine Pager [phone]  11/30/2011, 7:44 AM

## 2011-11-30 NOTE — Progress Notes (Signed)
0930 To cardiac cath dept with cardiac cath staff . Spouse in attendance

## 2011-11-30 NOTE — Progress Notes (Signed)
0007 MD on call for [facility] notified for clarification on cardiac cath consent. Currently two similar orders listed. Was told that MD would need to address this in the am prior to procedure for consent. Physician sticky note written.

## 2011-11-30 NOTE — Progress Notes (Signed)
Patient refusing CPAP at this time. RT will continue to monitor.  

## 2011-11-30 NOTE — CV Procedure (Signed)
    Cardiac Catheterization Operative Report  [name] [id number] 10/23/20139:31 AM Provider Not In System  Procedure Performed:  1. Left Heart Catheterization 2. Selective Coronary Angiography 3. Left ventricular pressues 4. Right heart catheterization  Operator: Verne Carrow, MD  Arterial access site:  Right radial artery. Left femoral vein.  Indication:  Severe aortic valve stenosis. Syncope.                                      Procedure Details: The risks, benefits, complications, treatment options, and expected outcomes were discussed with the patient. The patient and/or family concurred with the proposed plan, giving informed consent. The patient was brought to the cath lab after IV hydration was begun and oral premedication was given. The patient was further sedated with Versed and Fentanyl. The left groin was prepped and draped in a sterile fashion. 1% Lidocaine was used for local anesthesia. The right wrist was assessed with an Allens test which was positive. The right wrist was prepped and draped in a sterile fashion. 1% lidocaine was used for local anesthesia. Using the modified Seldinger access technique, a 5 French sheath was placed in the right radial artery. 3 mg Verapamil was given through the sheath. 4500 units IV heparin was given.  A right heart catheterization was performed with a multi-purpose catheter. Standard diagnostic catheters were used to perform selective coronary angiography. I was able to cross the bioprosthetic aortic valve with a straight stiff wire using the JR4 catheter for angulation. The sheath was removed from the right radial artery and a Terumo hemostasis band was applied at the arteriotomy site on the right wrist.   There were no immediate complications. The patient was taken to the recovery area in stable condition.   Hemodynamic Findings: Central aortic pressure: 111/64 Left ventricular pressure: 214/11/21 RA: 6 RV: 36/5/10 PA:  37/8 (mean 23) PCWP: 15 AO: 96% PA: 71% CO: 8.9 L/min (by Fick) CI: 4.5 L/min/m2 (by Fick  AVA: 0.94 cm2  Angiographic Findings:  Left main: No obstructive disease.   Left Anterior Descending Artery: Large caliber vessel that courses to the apex. No significant diagonal branches. No obstructive disease.   Circumflex Artery: Large caliber vessel with no obstructive disease. Moderate caliber intermediate branch with no obstructive disease.   Right Coronary Artery: Large dominant vessel with no obstructive disease.   Left Ventricular Angiogram: Deferred.    Impression: 1. No angiographic evidence of CAD 2. 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 (110 mm peak to peak gradient with AVA of 0.94)  Recommendations: Would proceed with planning for aortic valve revision.        Complications:  None. The patient tolerated the procedure well.

## 2011-11-30 NOTE — Progress Notes (Signed)
1030 telephone report received from Chip, cardiac cath RN

## 2011-11-30 NOTE — Progress Notes (Signed)
Internal Medicine Teaching Service Attending Note Date: 11/30/2011  Patient name: [name]  Medical record number: [id number]  Date of birth: Aug 04, 1945   I have seen and evaluated [name] and discussed their care with the Residency Team.   Has had some dental pain today.  S/p cath: 1. No angiographic evidence of CAD  2. 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 (110 mm peak to peak gradient with AVA of 0.94)    . aspirin  324 mg Oral Pre-Cath  . aspirin EC  81 mg Oral Daily  . carbidopa-levodopa  1 tablet Oral BID  . cloNIDine  0.2 mg Oral BID  . docusate sodium  100 mg Oral BID  . fentaNYL      . finasteride  5 mg Oral Daily  . furosemide  20 mg Oral Daily  . gabapentin  300 mg Oral BID  . heparin      . insulin aspart  0-9 Units Subcutaneous Q4H  . lidocaine      . midazolam      . midazolam      . nitroGLYCERIN      . omega-3 acid ethyl esters  1 g Oral BID  . pantoprazole  40 mg Oral Daily  . sodium chloride  3 mL Intravenous Q12H  . verapamil      . DISCONTD: aspirin  324 mg Oral Pre-Cath  . DISCONTD: diazepam  5 mg Oral On Call  . DISCONTD: ferumoxytol  510 mg Intravenous Q7 days  . DISCONTD: sodium chloride  3 mL Intravenous Q12H  . DISCONTD: sodium chloride  3 mL Intravenous Q12H     Physical Exam: Blood pressure 124/76, pulse 74, temperature 98 F (36.7 C), temperature source Oral, resp. rate 18, height 5\' 9"  (1.753 m), weight 83.689 kg (184 lb 8 oz), SpO2 98.00%. General appearance: alert, cooperative and no distress Resp: clear to auscultation bilaterally Cardio: regular rate and rhythm and systolic murmur: systolic ejection 3/6, crescendo at 2nd right intercostal space GI: normal findings: bowel sounds normal and soft, non-tender Extremities: edema none  Lab results: Results for orders placed during the hospital encounter of 11/28/11 (from the past 24 hour(s))  GLUCOSE, CAPILLARY     Status: Normal   Collection Time   11/29/11   1:32 PM      Component Value Range   Glucose-Capillary 98  70 - 99 mg/dL  GLUCOSE, CAPILLARY     Status: Abnormal   Collection Time   11/29/11  4:26 PM      Component Value Range   Glucose-Capillary 166 (*) 70 - 99 mg/dL   Comment 1 Notify RN    GLUCOSE, CAPILLARY     Status: Abnormal   Collection Time   11/29/11  7:58 PM      Component Value Range   Glucose-Capillary 108 (*) 70 - 99 mg/dL   Comment 1 Notify RN     Comment 2 Documented in Chart    URINALYSIS, ROUTINE W REFLEX MICROSCOPIC     Status: Abnormal   Collection Time   11/29/11 10:20 PM      Component Value Range   Color, Urine YELLOW  YELLOW   APPearance CLEAR  CLEAR   Specific Gravity, Urine 1.004 (*) 1.005 - 1.030   pH 6.5  5.0 - 8.0   Glucose, UA NEGATIVE  NEGATIVE mg/dL   Hgb urine dipstick NEGATIVE  NEGATIVE   Bilirubin Urine NEGATIVE  NEGATIVE   Ketones, ur NEGATIVE  NEGATIVE  mg/dL   Protein, ur NEGATIVE  NEGATIVE mg/dL   Urobilinogen, UA 1.0  0.0 - 1.0 mg/dL   Nitrite NEGATIVE  NEGATIVE   Leukocytes, UA NEGATIVE  NEGATIVE  GLUCOSE, CAPILLARY     Status: Abnormal   Collection Time   11/30/11 12:46 AM      Component Value Range   Glucose-Capillary 101 (*) 70 - 99 mg/dL   Comment 1 Documented in Chart     Comment 2 Notify RN    GLUCOSE, CAPILLARY     Status: Abnormal   Collection Time   11/30/11  5:01 AM      Component Value Range   Glucose-Capillary 104 (*) 70 - 99 mg/dL   Comment 1 Documented in Chart     Comment 2 Notify RN    CBC     Status: Abnormal   Collection Time   11/30/11  6:35 AM      Component Value Range   WBC 5.9  4.0 - 10.5 K/uL   RBC 3.04 (*) 4.22 - 5.81 MIL/uL   Hemoglobin 8.7 (*) 13.0 - 17.0 g/dL   HCT 16.1 (*) 09.6 - 04.5 %   MCV 91.1  78.0 - 100.0 fL   MCH 28.6  26.0 - 34.0 pg   MCHC 31.4  30.0 - 36.0 g/dL   RDW 40.9 (*) 81.1 - 91.4 %   Platelets 213  150 - 400 K/uL  COMPREHENSIVE METABOLIC PANEL     Status: Abnormal   Collection Time   11/30/11  6:35 AM      Component  Value Range   Sodium 135  135 - 145 mEq/L   Potassium 3.5  3.5 - 5.1 mEq/L   Chloride 97  96 - 112 mEq/L   CO2 28  19 - 32 mEq/L   Glucose, Bld 106 (*) 70 - 99 mg/dL   BUN 10  6 - 23 mg/dL   Creatinine, Ser 7.82  0.50 - 1.35 mg/dL   Calcium 9.9  8.4 - 95.6 mg/dL   Total Protein 6.3  6.0 - 8.3 g/dL   Albumin 3.8  3.5 - 5.2 g/dL   AST 52 (*) 0 - 37 U/L   ALT <5  0 - 53 U/L   Alkaline Phosphatase 54  39 - 117 U/L   Total Bilirubin 1.3 (*) 0.3 - 1.2 mg/dL   GFR calc non Af Amer >90  >90 mL/min   GFR calc Af Amer >90  >90 mL/min  GLUCOSE, CAPILLARY     Status: Abnormal   Collection Time   11/30/11  9:54 AM      Component Value Range   Glucose-Capillary 111 (*) 70 - 99 mg/dL    Imaging results:  Dg Orthopantogram  11/30/2011  *RADIOLOGY REPORT*  Clinical Data: Pain.  Possible abscess.  ORTHOPANTOGRAM/PANORAMIC  Comparison: None.  Findings: There has been considerable previous dental work with root canals, fillings and caps.  There is no evidence of significant periodontal disease and certainly no evidence of peri apical abscess affecting either the maxillary or mandibular dentition.  This technique is not sensitive for minor caries.  IMPRESSION: No evidence of advanced decay or advanced periodontal disease. Extensive previous dental work as above.   Original Report Authenticated By: Thomasenia Sales, M.D.    Dg Chest 2 View  11/30/2011  *RADIOLOGY REPORT*  Clinical Data: Preop for cardiac surgery  CHEST - 2 VIEW  Comparison: Portable chest x-ray of 11/28/2011  Findings: No active infiltrate or effusion is  seen.  There is mild cardiomegaly present.  An aortic valve replacement is noted.  IMPRESSION: Stable mild cardiomegaly.  No active lung disease.   Original Report Authenticated By: Juline Patch, M.D.     Assessment and Plan: I agree with the formulated Assessment and Plan with the following changes:  Syncope Possible re-do AVR Appreciate dental eval Await BCx Diuretic timing  changed to promote sleep

## 2011-11-30 NOTE — Consult Note (Signed)
DENTAL CONSULTATION  Date of Consultation:  11/30/2011 Patient Name:   [name] Date of Birth:   [birth year] Medical Record Number: [id number]  VITALS: BP 143/75  Pulse 77  Temp 98 F (36.7 C) (Oral)  Resp 18  Ht 5\' 9"  (1.753 m)  Wt 184 lb 8 oz (83.689 kg)  BMI 27.25 kg/m2  SpO2 98%   CHIEF COMPLAINT: Dental consultation requested prior to revision of a previous aortic valve replacement.  HPI: [name] presented to the hospital with severe aortic stenosis. Patient with previous aortic valve replacement in 2006. Consultation requested as part of a pre-heart valve surgery dental protocol to rule out dental infection that may affect the patient's systemic health and anticipated revision of the aortic valve replacement.  Patient currently is complaining of nonspecific pain coming from the upper right quadrant. Patient indicates that it may be coming from tooth #2, but he is not absolutely sure.  Patient describes the pain as being dull and achy in nature. He also indicates that pain reached an intensity of 8/10. Patient indicates that the pain is currently 0/10 right now.  Patient indicates that it hurts this morning, and previously 2-3 weeks ago. Patient has not seen a dentist since he had a cleaning prior to his previous heart valve surgery in 2006 inability was instructed to receive periodontal therapy every 6 months with routine dental care to prevent dental infection from affect in his previous heart valve surgery.  Patient indicates that his last dentist of record was Dr. Assunta Found.   PMH: Past Medical History  Diagnosis Date  . HTN (hypertension)   . GERD (gastroesophageal reflux disease)     bravo pH study 2008  . Hyperlipidemia   . Hemorrhoids     external and internal  . Insomnia   . Allergic rhinitis   . OA (osteoarthritis)   . Hiatal hernia   . BPH (benign prostatic hypertrophy)   . Chronic cough   . Anemia 2009  . Diverticulosis of colon     on  colonoscopy 2008  . Heart murmur   . Shortness of breath   . OSA (obstructive sleep apnea)     USES CPAP AS NEEDED  . Headache   . IBS (irritable bowel syndrome)   . Periodontitis     chronic with bone loss  . Gout   . Asthma   . Depression     pt denies  . Cataract   . Hypercholesterolemia   . Chronic interstitial cystitis   . Aortic stenosis   . H/O aortic valve replacement with porcine valve 2006  . Diabetes mellitus     type 2    PSH: Past Surgical History  Procedure Date  . Aortic valve replacement 10/13/2004    25mm [place] pericardial tissue valve  . Nasal septoplasty w/ turbinoplasty   . Hernia repair   . Right knee arthroscopy   . Bunionectomy     right  . Cataract extraction 2009    &   2012    BILATERAL  . Esophagogastroduodenoscopy 08/30/2011    Procedure: ESOPHAGOGASTRODUODENOSCOPY (EGD);  Surgeon: Louis Meckel, MD;  Location: [facility] ENDOSCOPY;  Service: Endoscopy;  Laterality: N/A;  Rm 3005   . Colonoscopy 08/31/2011    Procedure: COLONOSCOPY;  Surgeon: Louis Meckel, MD;  Location: [facility] ENDOSCOPY;  Service: Endoscopy;  Laterality: N/A;  . Refractive surgery     bilateral    ALLERGIES: Allergies  Allergen Reactions  . Codeine   .  Codeine Phosphate     REACTION: unspecified  . Dutasteride   . Feraheme (Ferumoxytol) Swelling    Pedal edema    MEDICATIONS: Current Facility-Administered Medications  Medication Dose Route Frequency Provider Last Rate Last Dose  . 0.9 %  sodium chloride infusion   Intravenous Continuous Kathleene Hazel, MD 50 mL/hr at 11/30/11 1000    . alum & mag hydroxide-simeth (MAALOX/MYLANTA) 200-200-20 MG/5ML suspension 30 mL  30 mL Oral Q6H PRN Elyse Jarvis, MD      . aspirin chewable tablet 324 mg  324 mg Oral Pre-Cath Cassell Clement, MD   324 mg at 11/30/11 0507  . aspirin EC tablet 81 mg  81 mg Oral Daily Kendra P Hiatt, PHARMD   81 mg at 11/29/11 1039  . carbidopa-levodopa (SINEMET IR) 25-100 MG per  tablet immediate release 1 tablet  1 tablet Oral BID Elyse Jarvis, MD   1 tablet at 11/29/11 2142  . cloNIDine (CATAPRES) tablet 0.2 mg  0.2 mg Oral BID Elyse Jarvis, MD   0.2 mg at 11/29/11 2142  . docusate sodium (COLACE) capsule 100 mg  100 mg Oral BID Elyse Jarvis, MD   100 mg at 11/29/11 2142  . fentaNYL (SUBLIMAZE) 0.05 MG/ML injection           . finasteride (PROSCAR) tablet 5 mg  5 mg Oral Daily Elyse Jarvis, MD   5 mg at 11/29/11 1039  . furosemide (LASIX) tablet 20 mg  20 mg Oral Daily Lollie Sails, MD   20 mg at 11/29/11 1344  . gabapentin (NEURONTIN) capsule 300 mg  300 mg Oral BID Elyse Jarvis, MD   300 mg at 11/29/11 2142  . heparin 2-0.9 UNIT/ML-% infusion           . insulin aspart (novoLOG) injection 0-9 Units  0-9 Units Subcutaneous Q4H Elyse Jarvis, MD   2 Units at 11/29/11 1740  . lidocaine (XYLOCAINE) 1 % injection           . midazolam (VERSED) 2 MG/2ML injection           . midazolam (VERSED) 2 MG/2ML injection           . nitroGLYCERIN (NTG ON-CALL) 0.2 mg/mL injection           . omega-3 acid ethyl esters (LOVAZA) capsule 1 g  1 g Oral BID Marlane Mingle Hiatt, PHARMD   1 g at 11/29/11 2142  . ondansetron (ZOFRAN) tablet 4 mg  4 mg Oral Q6H PRN Elyse Jarvis, MD       Or  . ondansetron (ZOFRAN) injection 4 mg  4 mg Intravenous Q6H PRN Elyse Jarvis, MD      . pantoprazole (PROTONIX) EC tablet 40 mg  40 mg Oral Daily Elyse Jarvis, MD   40 mg at 11/29/11 1046  . sodium chloride 0.9 % injection 3 mL  3 mL Intravenous Q12H Elyse Jarvis, MD   3 mL at 11/29/11 2144  . verapamil (ISOPTIN) 2.5 MG/ML injection           . DISCONTD: 0.9 %  sodium chloride infusion  250 mL Intravenous PRN Cassell Clement, MD      . DISCONTD: 0.9 %  sodium chloride infusion  250 mL Intravenous PRN Cassell Clement, MD      . DISCONTD: acetaminophen (TYLENOL) suppository 650 mg  650 mg Rectal Q6H PRN Elyse Jarvis, MD      . DISCONTD: acetaminophen (TYLENOL) tablet 650 mg  650 mg Oral Q6H  PRN Elyse Jarvis, MD   650 mg at 11/30/11 0507  . DISCONTD: aspirin chewable tablet 324 mg  324 mg Oral Pre-Cath Cassell Clement, MD      . DISCONTD: diazepam (VALIUM) tablet 5 mg  5 mg Oral On Call Cassell Clement, MD      . DISCONTD: ferumoxytol [facility]) injection 510 mg  510 mg Intravenous Q7 days Iva Boop, MD      . DISCONTD: oxyCODONE (Oxy IR/ROXICODONE) immediate release tablet 5 mg  5 mg Oral Q4H PRN Lollie Sails, MD   5 mg at 11/29/11 1344  . DISCONTD: oxyCODONE (Oxy IR/ROXICODONE) immediate release tablet 5-10 mg  5-10 mg Oral Q4H PRN Lollie Sails, MD      . DISCONTD: sodium chloride 0.9 % injection 3 mL  3 mL Intravenous Q12H Cassell Clement, MD   3 mL at 11/29/11 2144  . DISCONTD: sodium chloride 0.9 % injection 3 mL  3 mL Intravenous PRN Cassell Clement, MD      . DISCONTD: sodium chloride 0.9 % injection 3 mL  3 mL Intravenous Q12H Cassell Clement, MD      . DISCONTD: sodium chloride 0.9 % injection 3 mL  3 mL Intravenous PRN Cassell Clement, MD        LABS: Lab Results  Component Value Date   WBC 5.9 11/30/2011   HGB 8.7* 11/30/2011   HCT 27.7* 11/30/2011   MCV 91.1 11/30/2011   PLT 213 11/30/2011      Component Value Date/Time   NA 135 11/30/2011 0635   K 3.5 11/30/2011 0635   CL 97 11/30/2011 0635   CO2 28 11/30/2011 0635   GLUCOSE 106* 11/30/2011 0635   BUN 10 11/30/2011 0635   CREATININE 0.72 11/30/2011 0635   CALCIUM 9.9 11/30/2011 0635   GFRNONAA >90 11/30/2011 0635   GFRAA >90 11/30/2011 0635   Lab Results  Component Value Date   INR 1.22 11/28/2011   INR 1.00 01/05/2009   INR 1.0 09/19/2008   No results found for this basename: PTT    SOCIAL HISTORY: History   Social History  . Marital Status: Married    Spouse Name: N/A    Number of Children: 0  . Years of Education: N/A   Occupational History  . retired    Social History Main Topics  . Smoking status: Former Smoker    Quit date: 09/30/1971  . Smokeless tobacco:  Never Used  . Alcohol Use: No  . Drug Use: No  . Sexually Active: Not Currently   Other Topics Concern  . Not on file   Social History Narrative  . No narrative on file    FAMILY HISTORY: Family History  Problem Relation Age of Onset  . Heart disease Father   . Osteoarthritis Mother   . Hypertension Sister   . Hyperlipidemia       REVIEW OF SYSTEMS: Reviewed from chart for this admission.  DENTAL HISTORY: CHIEF COMPLAINT: Dental consultation requested prior to revision of a previous aortic valve replacement.  HPI: [name] presented to the hospital with severe aortic stenosis. Patient with previous aortic valve replacement in 2006. Consultation requested as part of a pre-heart valve surgery dental protocol to rule out dental infection that may affect the patient's systemic health and anticipated revision of the aortic valve replacement.  Patient currently is complaining of nonspecific pain coming from the upper right quadrant. Patient indicates that it may be coming from tooth #2, but he is  not absolutely sure.  Patient describes the pain as being dull and achy in nature. He also indicates that pain reached an intensity of 8/10. Patient indicates that the pain is currently 0/10 right now.  Patient indicates that it hurts this morning, and previously 2-3 weeks ago. Patient has not seen a dentist since he had a cleaning prior to his previous heart valve surgery in 2006 inability was instructed to receive periodontal therapy every 6 months with routine dental care to prevent dental infection from affect in his previous heart valve surgery.  Patient indicates that his last dentist of record was Dr. Assunta Found.  DENTAL EXAMINATION:  GENERAL: Patient is a well-developed, well-nourished male in no acute distress. HEAD AND NECK: Her is no obvious neck lymphadenopathy. The patient denies acute TMJ symptoms. INTRAORAL EXAM: The patient has normal saliva. I do not see any evidence  of abscess formation. DENTITION: Patient has multiple missing teeth murmurs 1, 12, 14, 16, 19 and 30. PERIODONTAL: Patient with chronic periodontitis with plaque accumulations, selective areas gingival recession and no significant tooth mobility. DENTAL CARIES/SUBOPTIMAL RESTORATIONS: Patient has facial caries involving tooth #9. Would need a full series of dental radius to rule out other incipient dental caries. ENDODONTIC: Patient currently with questionable acute pulpitis symptoms. Patient with nonspecific pain that may actually be representing in a headache by his report.  No obvious periapical radiolucency or pathology is noted at this time. CROWN AND BRIDGE: Patient with multiple crown restorations. PROSTHODONTIC: The patient denies presence of partial dentures. OCCLUSION: Patient with a poor occlusal scheme secondary to multiple missing teeth, supra-eruption and drifting of the unopposed teeth into the edentulous areas and lack of replacement of missing teeth with dental prostheses.  RADIOGRAPHIC INTERPRETATION: And orthopantogram was taken on 11/29/2011. There multiple missing teeth. There is supra-eruption and drifting of the unopposed teeth into the edentulous areas. There is incipient to moderate bone loss. Her multiple dental restorations noted. There is no obvious periapical pathology or radiolucencies. There multiple root canal therapies noted.   ASSESSMENTS: 1. Nonspecific pain from the upper right quadrant. Ideally this would be evaluated by an endodontist to rule out possible acute pulpitis. However, this would need to be done as an outpatient. Patient more than likely will not be able to be discharged prior to the anticipated aortic valve replacement. 2. chronic periodontitis with bone loss.  This may be contributing to the dull aching nonspecific upper right quadrant pain. 3. plaque and calculus accumulations 4. Gingival recession 5. Multiple missing teeth 6. Supra-eruption and  drifting of the unopposed teeth into the edentulous areas 7. Poor occlusal scheme and malocclusion. This may be also contributing to possible nonspecific upper right quadrant pain if he is experiencing trauma from occlusion. 8. Dental caries 9. History of oral neglect 10. Need for an about a premedication prior to invasive dental procedures    PLAN/RECOMMENDATIONS: 1. I discussed the risks, benefits, and complications of various treatment options with the patient in relationship to his medical and dental conditions. We discussed various treatment options to include no treatment, extractions with alveoloplasty, pre-prosthetic surgery as indicated, periodontal therapy, dental restorations, root canal therapy, crown and bridge therapy, implant therapy, and replacement of missing teeth as indicated. In light of the patient's current nonspecific upper right quadrant pain and the inability to have an outpatient endodontic evaluation at this time, the patient is currently considering deferment of all dental treatment at this time with followup evaluation once he is medically stable from a heart valve  surgery. Will be followed for development of further acute problems and may consider dental extraction with alveoloplasty prior to heart valve surgery as indicated. The lateral need to discuss the options further with the patient as well as Dr. Cornelius Moras concerning the plan they wish to pursue at this time.  The patient is to be started on chlorhexidine rinses twice daily use.  2. Discussion of findings with medical team and coordination of future medical and dental care.  Charlynne Pander, DDS

## 2011-11-30 NOTE — H&P (View-Only) (Signed)
 Subjective:  No further episodes of syncope. No chest pain. Complains of headache.  Objective:  Vital Signs in the last 24 hours: Temp:  [97.4 F (36.3 C)-98.9 F (37.2 C)] 98.5 F (36.9 C) (10/22 0442) Pulse Rate:  [57-70] 70  (10/22 0442) Resp:  [18-22] 22  (10/22 0442) BP: (95-132)/(56-74) 95/64 mmHg (10/22 0442) SpO2:  [94 %-100 %] 94 % (10/22 0442) Weight:  [184 lb 11.9 oz (83.8 kg)-191 lb 6.4 oz (86.818 kg)] 184 lb 11.9 oz (83.8 kg) (10/22 0442)  Intake/Output from previous day: 10/21 0701 - 10/22 0700 In: 1297 [P.O.:1291; I.V.:6] Out: 2850 [Urine:2850] Intake/Output from this shift: Total I/O In: 457 [P.O.:457] Out: 25 [Urine:25]     . sodium chloride   Intravenous Weekly  . aspirin EC  81 mg Oral Daily  . carbidopa-levodopa  1 tablet Oral BID  . cloNIDine  0.2 mg Oral BID  . docusate sodium  100 mg Oral BID  . ferumoxytol  510 mg Intravenous Q7 days  . finasteride  5 mg Oral Daily  . furosemide  40 mg Intravenous Once  . gabapentin  300 mg Oral BID  . insulin aspart  0-9 Units Subcutaneous Q4H  . omega-3 acid ethyl esters  1 g Oral BID  . pantoprazole  40 mg Oral Daily  . sodium chloride  3 mL Intravenous Q12H  . DISCONTD: aspirin  81 mg Oral Daily  . DISCONTD: fish oil-omega-3 fatty acids  1 g Oral BID  . DISCONTD: hydrochlorothiazide  12.5 mg Oral Daily  . DISCONTD: losartan  100 mg Oral Daily  . DISCONTD: metoprolol  100 mg Oral BID      Physical Exam: The patient appears to be in no distress. Skin color is pale.  Head and neck exam reveals that the pupils are equal and reactive. The extraocular movements are full. There is no scleral icterus. Mouth and pharynx are benign. No lymphadenopathy. There are bilateral carotid bruits transmitted from the heart. Carotid upstroke appears to be normal. The jugular venous pressure is normal. Thyroid is not enlarged or tender.  Chest is clear to percussion and auscultation. No rales or rhonchi. Expansion of the  chest is symmetrical.  Heart reveals a grade 3/6 harsh systolic ejection murmur loudest at the base and radiating to the neck. No gallop or rub  The abdomen is soft and nontender. Bowel sounds are normoactive. There is no hepatosplenomegaly or mass. There are no abdominal bruits.  Extremities reveal no phlebitis or edema. Pedal pulses are good. There is no cyanosis or clubbing.  Neurologic exam is normal strength and no lateralizing weakness. No sensory deficits.  Integument reveals no rash  Lab Results:  Basename 11/29/11 0615 11/28/11 0601  WBC 6.6 9.0  HGB 8.7* 8.5*  PLT 208 191    Basename 11/29/11 0615 11/28/11 0601  NA 135 132*  K 3.6 4.3  CL 95* 98  CO2 29 27  GLUCOSE 148* 135*  BUN 14 14  CREATININE 0.81 0.75    Basename 11/28/11 2212 11/28/11 1652  TROPONINI <0.30 <0.30   Hepatic Function Panel  Basename 11/29/11 0615  PROT 6.7  ALBUMIN 4.0  AST 62*  ALT <5  ALKPHOS 56  BILITOT 1.4*  BILIDIR --  IBILI --    Basename 11/28/11 1100  CHOL 105   No results found for this basename: PROTIME in the last 72 hours  Imaging: Dg Chest Port 1 View  11/28/2011  *RADIOLOGY REPORT*  Clinical Data: Syncope.  PORTABLE   CHEST - 1 VIEW  Comparison: 08/31/2011.  Findings: Trachea is midline.  Heart is enlarged.  Lungs are low in volume with diffuse mixed interstitial and air space disease.  No definite pleural fluid.  IMPRESSION: Low lung volumes with presumed pulmonary edema.   Original Report Authenticated By: MELINDA A. BLIETZ, M.D.     Cardiac Studies: Telemetry reiviewed. NSR. No arrhythmia to explain syncope so far.  Assessment/Plan:  Patient Active Hospital Problem List: Syncope (08/29/2011)   Assessment: No further events.   Plan: Continue telemetry.  Await echo results ?consider further neuro w/u and EEG in view of persistent headaches ?    LOS: 1 day    Bekim Werntz 11/29/2011, 10:15 AM    

## 2011-11-30 NOTE — Interval H&P Note (Signed)
History and Physical Interval Note:  11/30/2011 8:39 AM  [name]  has presented today for cardiac cath with diagnosis of severe aortic valve stenosis.  The various methods of treatment have been discussed with the patient and family. After consideration of risks, benefits and other options for treatment, the patient has consented to  Procedure(s) (LRB) with comments: LEFT HEART CATHETERIZATION WITH CORONARY ANGIOGRAM (N/A) as a surgical intervention .  The patient's history has been reviewed, patient examined, no change in status, stable for surgery.  I have reviewed the patient's chart and labs.  Questions were answered to the patient's satisfaction.     Damacio Weisgerber

## 2011-12-01 ENCOUNTER — Inpatient Hospital Stay (HOSPITAL_COMMUNITY): Payer: Medicare Other

## 2011-12-01 DIAGNOSIS — Z01810 Encounter for preprocedural cardiovascular examination: Secondary | ICD-10-CM

## 2011-12-01 DIAGNOSIS — E871 Hypo-osmolality and hyponatremia: Secondary | ICD-10-CM | POA: Diagnosis not present

## 2011-12-01 DIAGNOSIS — D509 Iron deficiency anemia, unspecified: Secondary | ICD-10-CM | POA: Diagnosis not present

## 2011-12-01 DIAGNOSIS — Z954 Presence of other heart-valve replacement: Secondary | ICD-10-CM

## 2011-12-01 DIAGNOSIS — I5031 Acute diastolic (congestive) heart failure: Secondary | ICD-10-CM | POA: Diagnosis not present

## 2011-12-01 DIAGNOSIS — D62 Acute posthemorrhagic anemia: Secondary | ICD-10-CM | POA: Diagnosis not present

## 2011-12-01 DIAGNOSIS — R51 Headache: Secondary | ICD-10-CM | POA: Diagnosis not present

## 2011-12-01 DIAGNOSIS — R55 Syncope and collapse: Secondary | ICD-10-CM | POA: Diagnosis not present

## 2011-12-01 DIAGNOSIS — I359 Nonrheumatic aortic valve disorder, unspecified: Secondary | ICD-10-CM | POA: Diagnosis not present

## 2011-12-01 DIAGNOSIS — I10 Essential (primary) hypertension: Secondary | ICD-10-CM | POA: Diagnosis not present

## 2011-12-01 LAB — GLUCOSE, CAPILLARY
Glucose-Capillary: 114 mg/dL — ABNORMAL HIGH (ref 70–99)
Glucose-Capillary: 117 mg/dL — ABNORMAL HIGH (ref 70–99)
Glucose-Capillary: 119 mg/dL — ABNORMAL HIGH (ref 70–99)

## 2011-12-01 LAB — PULMONARY FUNCTION TEST

## 2011-12-01 MED ORDER — LORAZEPAM 0.5 MG PO TABS
0.5000 mg | ORAL_TABLET | Freq: Once | ORAL | Status: DC | PRN
  Filled 2011-12-01: qty 1

## 2011-12-01 MED ORDER — ZOLPIDEM TARTRATE 5 MG PO TABS
5.0000 mg | ORAL_TABLET | Freq: Every day | ORAL | Status: DC
  Administered 2011-12-01 – 2011-12-05 (×5): 5 mg via ORAL
  Filled 2011-12-01 (×5): qty 1

## 2011-12-01 MED ORDER — FERROUS GLUCONATE 324 (38 FE) MG PO TABS
324.0000 mg | ORAL_TABLET | Freq: Two times a day (BID) | ORAL | Status: DC
  Administered 2011-12-01 – 2011-12-05 (×9): 324 mg via ORAL
  Filled 2011-12-01 (×12): qty 1

## 2011-12-01 MED ORDER — DIPHENHYDRAMINE HCL 50 MG/ML IJ SOLN
12.5000 mg | Freq: Once | INTRAMUSCULAR | Status: AC
  Administered 2011-12-01: 12.5 mg via INTRAVENOUS
  Filled 2011-12-01: qty 1

## 2011-12-01 MED ORDER — CARBIDOPA-LEVODOPA 25-100 MG PO TABS
2.0000 | ORAL_TABLET | Freq: Every day | ORAL | Status: DC
  Administered 2011-12-02 – 2011-12-05 (×4): 2 via ORAL
  Filled 2011-12-01 (×6): qty 2

## 2011-12-01 MED ORDER — ALBUTEROL SULFATE (5 MG/ML) 0.5% IN NEBU
2.5000 mg | INHALATION_SOLUTION | Freq: Once | RESPIRATORY_TRACT | Status: AC
  Administered 2011-12-01: 2.5 mg via RESPIRATORY_TRACT

## 2011-12-01 MED ORDER — ENOXAPARIN SODIUM 40 MG/0.4ML ~~LOC~~ SOLN
40.0000 mg | SUBCUTANEOUS | Status: DC
  Administered 2011-12-01 – 2011-12-04 (×4): 40 mg via SUBCUTANEOUS
  Filled 2011-12-01 (×5): qty 0.4

## 2011-12-01 MED ORDER — PROCHLORPERAZINE EDISYLATE 5 MG/ML IJ SOLN
5.0000 mg | Freq: Once | INTRAMUSCULAR | Status: AC
  Administered 2011-12-01: 5 mg via INTRAVENOUS
  Filled 2011-12-01: qty 1

## 2011-12-01 NOTE — Progress Notes (Signed)
SUBJECTIVE: Pt anxious overnight. Felt his heart fluttering but telemetry only shows sinus tach. No chest pain, SOB or syncope.   BP 129/89  Pulse 111  Temp 98.5 F (36.9 C) (Oral)  Resp 22  Ht 5\' 9"  (1.753 m)  Wt 182 lb 8.7 oz (82.8 kg)  BMI 26.96 kg/m2  SpO2 97%  Intake/Output Summary (Last 24 hours) at 12/01/11 0737 Last data filed at 12/01/11 4098  Gross per 24 hour  Intake   1683 ml  Output   1925 ml  Net   -242 ml    PHYSICAL EXAM General: Well developed, well nourished, in no acute distress. Alert and oriented x 3.  Psych:  Good affect, responds appropriately Neck: No JVD. No masses noted.  Lungs: Clear bilaterally with no wheezes or rhonci noted.  Heart: RRR with loud systolic murmur  Abdomen: Bowel sounds are present. Soft, non-tender.  Extremities: No lower extremity edema.   LABS: Basic Metabolic Panel:  Basename 11/30/11 0635 11/29/11 0615  NA 135 135  K 3.5 3.6  CL 97 95*  CO2 28 29  GLUCOSE 106* 148*  BUN 10 14  CREATININE 0.72 0.81  CALCIUM 9.9 10.1  MG -- --  PHOS -- --   CBC:  Basename 11/30/11 0635 11/29/11 0615  WBC 5.9 6.6  NEUTROABS -- --  HGB 8.7* 8.7*  HCT 27.7* 28.5*  MCV 91.1 92.5  PLT 213 208   Cardiac Enzymes:  Basename 11/28/11 2212 11/28/11 1652 11/28/11 1056  CKTOTAL -- -- --  CKMB -- -- --  CKMBINDEX -- -- --  TROPONINI <0.30 <0.30 <0.30   Fasting Lipid Panel:  Basename 11/28/11 1100  CHOL 105  HDL 40  LDLCALC 50  TRIG 77  CHOLHDL 2.6  LDLDIRECT --    Current Meds:    . aspirin EC  81 mg Oral Daily  . carbidopa-levodopa  1 tablet Oral BID  . chlorhexidine  15 mL Mouth/Throat BID  . cloNIDine  0.1 mg Oral BID  . diphenhydrAMINE  12.5 mg Intravenous Once  . fentaNYL      . finasteride  5 mg Oral Daily  . furosemide  20 mg Oral Daily  . gabapentin  300 mg Oral BID  . heparin      . lidocaine      . midazolam      . midazolam      . nitroGLYCERIN      . omega-3 acid ethyl esters  1 g Oral BID    . pantoprazole  40 mg Oral Daily  . prochlorperazine  5 mg Intravenous Once  . senna-docusate  1 tablet Oral BID  . sodium chloride  3 mL Intravenous Q12H  . traZODone  50 mg Oral QHS  . verapamil      . DISCONTD: cloNIDine  0.1 mg Oral BID  . DISCONTD: cloNIDine  0.2 mg Oral BID  . DISCONTD: diazepam  5 mg Oral On Call  . DISCONTD: docusate sodium  100 mg Oral BID  . DISCONTD: insulin aspart  0-9 Units Subcutaneous Q4H  . DISCONTD: sodium chloride  3 mL Intravenous Q12H  . DISCONTD: sodium chloride  3 mL Intravenous Q12H     ASSESSMENT AND PLAN:   1. Severe aortic valve stenosis, bioprosthetic aortic valve: Cardiac cath yesterday with 110 mm gradient across aortic valve secondary to stenosis of valve.  No evidence of obstructive CAD. Further plans for valve replacement per Dr. Cornelius Moras.   2. Syncope: Presumed to  be secondary to valvular disease.   3. Palpitations: Sinus tach overnight. No evidence of SVT or atrial fib.       [name]  10/24/20137:37 AM

## 2011-12-01 NOTE — Progress Notes (Signed)
Patient refusing CPAP at this time. RT will continue to monitor.  

## 2011-12-01 NOTE — Progress Notes (Signed)
Pre-op Cardiac Surgery  Carotid Findings:   Bilateral:  No evidence of hemodynamically significant internal carotid artery stenosis.   Vertebral artery flow is antegrade.      Upper Extremity Right Left  Brachial Pressures 112 114  Radial Waveforms Tri Tri  Ulnar Waveforms Tri Tri  Palmar Arch (Allen's Test) Normal with radial compression, decreases >50% with ulnar compression Normal with radial and ulnar compression    Farrel Demark, RDMS, RVT 12/01/2011

## 2011-12-01 NOTE — Progress Notes (Signed)
Subjective:    Interval Events:  Pt anxious overnight. Felt his heart fluttering but telemetry only shows sinus tach. No chest pain, SOB or syncope.  His headache recurred around 21:00 last night.  It persisted until around 03:00 when he finally fell asleep.  It was gone when he woke up.  He is still experiencing the "tightness" on the left side of his face. He expressed two new complaints today.  First, he experiences a burning sensation in the tip of his penis when he urinates.  He denies foul smelling urine, discolored urine, or hematuria.  Second, he has erectile dysfunction but is still able to ejaculate.  When he does ejaculate, he experiences pain in his right groin.  He has a history of double hernia repair in the 1990s.    Objective:    Vital Signs:   Temp:  [98.3 F (36.8 C)-98.5 F (36.9 C)] 98.5 F (36.9 C) (10/24 0607) Pulse Rate:  [74-111] 111  (10/24 0607) Resp:  [18-22] 22  (10/24 0607) BP: (124-150)/(68-92) 129/89 mmHg (10/24 0607) SpO2:  [96 %-99 %] 97 % (10/24 0607) Weight:  [182 lb 8.7 oz (82.8 kg)] 182 lb 8.7 oz (82.8 kg) (10/24 0607) Last BM Date: 11/30/11   Weights: 24-hour Weight change: -1 lb 15.4 oz (-0.889 kg)  Filed Weights   11/29/11 0442 11/30/11 0609 12/01/11 0607  Weight: 184 lb 11.9 oz (83.8 kg) 184 lb 8 oz (83.689 kg) 182 lb 8.7 oz (82.8 kg)   Net since admission:  -4kg   Intake/Output:   Gross per 24 hour  Intake   1683 ml  Output   1925 ml  Net    -242 ml     Net since admission:  -1.7L   Physical Exam: GENERAL:  alert and oriented; resting comfortably, sitting on the edge of his bed and in no distress EYES:  pupils equal, round, and reactive to light; sclera anicteric ENT:  moist mucosa, oropharynx clear LUNGS:  clear to auscultation bilaterally, normal work of breathing HEART: normal rate; regular rhythm; 3/6 systolic murmur; no rubs, clicks, or gallops ABDOMEN:  soft, non-tender, normal bowel sounds, no masses palpated GU:   normal uncircumcised penis, urethral orifice is normal without drainage or erythema, testicles are normal and non-tender, no hernias appreciated SKIN:  normal turgor CRANIAL NERVES:  Cranial nerves V is intact.  Facial sensation is intact and equal bilaterally in V1, V2, and V3; masseter and temporalis function is intact.    Labs: CBG:  Lab 12/01/11 0024 11/30/11 2038 11/30/11 1617 11/30/11 1218 11/30/11 0954  GLUCAP 114* 117* 119* 143* 111*   Microbiology: Results for orders placed during the hospital encounter of 11/28/11  CULTURE, BLOOD (ROUTINE X 2)     Status: Normal (Preliminary result)   Collection Time   11/29/11 10:30 AM      Component Value Range Status Comment   Specimen Description BLOOD LEFT FOREARM   Final    Special Requests BOTTLES DRAWN AEROBIC AND ANAEROBIC 10CC   Final    Culture  Setup Time 11/29/2011 14:45   Final    Culture     Final    Value:        BLOOD CULTURE RECEIVED NO GROWTH TO DATE CULTURE WILL BE HELD FOR 5 DAYS BEFORE ISSUING A FINAL NEGATIVE REPORT   Report Status PENDING   Incomplete   CULTURE, BLOOD (ROUTINE X 2)     Status: Normal (Preliminary result)   Collection Time   11/29/11  10:32 AM      Component Value Range Status Comment   Specimen Description BLOOD RIGHT WRIST   Final    Special Requests BOTTLES DRAWN AEROBIC ONLY 10CC   Final    Culture  Setup Time 11/29/2011 14:46   Final    Culture     Final    Value:        BLOOD CULTURE RECEIVED NO GROWTH TO DATE CULTURE WILL BE HELD FOR 5 DAYS BEFORE ISSUING A FINAL NEGATIVE REPORT   Report Status PENDING   Incomplete     Other results: Imaging: Dg Orthopantogram 11/30/2011   FINDINGS: There has been considerable previous dental work with root canals, fillings and caps.  There is no evidence of significant periodontal disease and certainly no evidence of peri apical abscess affecting either the maxillary or mandibular dentition.  This technique is not sensitive for minor caries.     IMPRESSION: No evidence of advanced decay or advanced periodontal disease. Extensive previous dental work as above.  Dg Chest 2 View 11/30/2011  FINDINGS: No active infiltrate or effusion is seen.  There is mild cardiomegaly present.  An aortic valve replacement is noted.   IMPRESSION: Stable mild cardiomegaly.  No active lung disease.     Medications:    Infusions:    . sodium chloride Stopped (11/30/11 1418)     Scheduled Medications:    . aspirin EC  81 mg Oral Daily  . carbidopa-levodopa  1 tablet Oral BID  . chlorhexidine  15 mL Mouth/Throat BID  . cloNIDine  0.1 mg Oral BID  . diphenhydrAMINE  12.5 mg Intravenous Once  . finasteride  5 mg Oral Daily  . furosemide  20 mg Oral Daily  . gabapentin  300 mg Oral BID  . omega-3 acid ethyl esters  1 g Oral BID  . pantoprazole  40 mg Oral Daily  . prochlorperazine  5 mg Intravenous Once  . senna-docusate  1 tablet Oral BID  . sodium chloride  3 mL Intravenous Q12H  . traZODone  50 mg Oral QHS  . DISCONTD: cloNIDine  0.1 mg Oral BID  . DISCONTD: cloNIDine  0.2 mg Oral BID  . DISCONTD: diazepam  5 mg Oral On Call  . DISCONTD: docusate sodium  100 mg Oral BID  . DISCONTD: insulin aspart  0-9 Units Subcutaneous Q4H  . DISCONTD: sodium chloride  3 mL Intravenous Q12H  . DISCONTD: sodium chloride  3 mL Intravenous Q12H     PRN Medications: alum & mag hydroxide-simeth, LORazepam, ondansetron (ZOFRAN) IV, ondansetron, DISCONTD: sodium chloride, DISCONTD: sodium chloride, DISCONTD: acetaminophen, DISCONTD: acetaminophen, DISCONTD: oxyCODONE, DISCONTD: sodium chloride, DISCONTD: sodium chloride    Assessment/ Plan:     1.   Syncope:  This is multifactorial.  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  This, coupled with 100mg  of metoprolol BID and chronic anemia with hemoglobins in the 8s has resulted in syncope.  Arrhythmias are unlikely since these  episodes he has been having have not been sudden and there is always warning signs.  Seizure activity is less likely, as one would expect seizure-like activity and post-ictal confusion. Cardiothoracic surgery has seen the patient and will follow. - stopped metoprolol - need recs from CTS regarding valve revision   2.   Chronic daily headache:  This too is likely multifactorial.  Parts of his history are consistent with morning headaches secondary to sleep apnea.  He has documented sleep apnea, but  has refused to consistently use CPAP at night.  This does not, however, explain all of his headaches.  His history is consistent with migraine headaches and rebound headaches.  He may have a long history of tension headaches, for which he has self medicated with over the counter analgesia, leading now to rebound headaches; indeed he reports frequent acetaminophen use over the past few weeks.  Alternatively, he may be experiencing migraine headaches.  Photophobia and unilateral pain support this.  His history is not consistent with cluster headaches.  Perhaps clonidine and relative hypotension is contributing.  We will stop APAP and oxycodone.  Ergotamines are probably not a good alternative treatment with his heart issues, but prochlorperazine (5 to 10 mg every 6 hours) with diphenhydramine (12.5 mg every 6 hours) may be a reasonable alternative.  We have also tapered the clonidine dose to 0.1mg  BID. - patient has no PRN analgesics ordered, will try 10mg  prochlorperazine + 12.5mg  diphenhydramine if headache recurs  3.   Iron deficiency anemia:  Patient is followed by Dr. Leone Payor for a gastrointestinal tract bleed that Dr. Leone Payor has been unable to identify.  The patient has previously refused capsule endoscopy.  In September, iron panel was consistent with iron deficiency.  An infusion of ferumoxytol was administered by Dr. Leone Payor, but the patient experienced lower extremity edema.  I have talked with hematology  and they suggest any of the other iron preperations, either iron dextran or iron sucrose.  Alternatively, he may need a blood transfusion.  We will wait to hear from cardiothoracic surgery before treating the anemia intravenously.  He does take 324mg  of ferrous gluconate daily at home.  Will start this BID. - consider IV iron dextran, iron sucrose, or blood tranfusion - ferrous gluconate 324mg  BID   4.   Elevated proBNP:  This may represent congestive heart failure, secondary to critical aortic stenosis.  Echocardiography demonstrated normal systolic function but critical aortic stenosis and grade I diastolic dysfunction.  Chest roentgenogram is also consistent with pulmonary edema and he has trace pedal edema.  According to his records, he experienced some edema and depression from oral furosemide that was prescribed around 2 weeks prior to admission.  He responded well to IV furosemide on 10/21, so we have restarted him on daily furosemide 20mg . - furosemide 20mg  daily  5.   Hypertension:  Blood pressure is under control currently.  At home, he takes clonidine 0.2mg  BID, HCTZ 12.5mg  daily, losartan 100mg  daily, and metoprolol 100mg  BID.  He was also recently started on furosemide 20mg  daily for peripheral edema.  We have stopped his metoprolol as was bradycardic and symptomatic with syncope.  We have also held his HCTZ and losartan.  We have restarted his furosemide at 20mg  daily, and we have decreased the clonidine dose to 0.1mg  BID. - continue clonidine 0.1mg  BID - hold HCTZ and losartan - stopped metoprolol  6.   Diabetes:  Glucose has been well controlled here with minimal insulin.  Last hemoglobin A1c is 7, it is currently 4.4 as a result of his anemia.  We will stop correctional insulin and CBG checks. - stop correctional insulin  7.   Dyslipidemia:  At home, he takes fish oil.  Fasting lipid panel is normal with an LDL of 50. - continue fish oil  8.   Hyponatremia:  Was low at 132 at  admission and he has run low in the past around 131 to 132.  After a dose of IV furosemide, it had  increased to 135.  9.   Restless leg syndrome:  Patient says the clonidine was prescribed for hypertension not restless leg.  The medicines he takes for restless leg are carbidopa-levodopa and gabapentin.  For some time now, he has been taking 2 tablets of carbidopa-levodopa with breakfast rather than the BID dosing we have been using here.  He says the once a day dosing works better.  We will try this. - switch carbidopa-levodopa to 2 tablets with breakfast - continue gabapentin BID  10. Insomnia:  Anxiety is likely playing a big part in this.  His headaches are also probably contributing to this and maybe caused by this.  He did not respond well to trazodone last night, but he says he has used Ambien before with good effect.  We will try this tonight. - zolpidem 5mg  qHS  11. Dysuria:  Exam of genitalia is normal and urinalysis from October 22 is entirely normal, negative for hemoglobin, leukocyte esterase, and nitrite.  He is established with a urologist and plans to follow up with him once he is squared away with his heart.  I do not suspect a urinary tract infection.  12. Prophylaxis: - enoxaparin for VTE prophylaxis - senna-docusate BID for bowel regimen    Length of Stay: 3 days   Signed by:  Dorthula Rue. Earlene Plater, MD PGY-I, Internal Medicine Pager [phone]  12/01/2011, 9:06 AM

## 2011-12-01 NOTE — Progress Notes (Signed)
Internal Medicine Teaching Service Attending Note Date: 12/01/2011  Patient name: [name]  Medical record number: [id number]  Date of birth: [birth year]   I have seen and evaluated [name] and discussed their care with the Residency Team.  Significant anxiety last night keeping pt awake    . albuterol  2.5 mg Nebulization Once  . aspirin EC  81 mg Oral Daily  . carbidopa-levodopa  2 tablet Oral Q breakfast  . chlorhexidine  15 mL Mouth/Throat BID  . cloNIDine  0.1 mg Oral BID  . diphenhydrAMINE  12.5 mg Intravenous Once  . enoxaparin (LOVENOX) injection  40 mg Subcutaneous Q24H  . ferrous gluconate  324 mg Oral BID WC  . finasteride  5 mg Oral Daily  . furosemide  20 mg Oral Daily  . gabapentin  300 mg Oral BID  . omega-3 acid ethyl esters  1 g Oral BID  . pantoprazole  40 mg Oral Daily  . prochlorperazine  5 mg Intravenous Once  . senna-docusate  1 tablet Oral BID  . sodium chloride  3 mL Intravenous Q12H  . zolpidem  5 mg Oral QHS  . DISCONTD: carbidopa-levodopa  1 tablet Oral BID  . DISCONTD: traZODone  50 mg Oral QHS    Physical Exam: Blood pressure 117/88, pulse 117, temperature 98.2 F (36.8 C), temperature source Oral, resp. rate 20, height 5\' 9"  (1.753 m), weight 82.8 kg (182 lb 8.7 oz), SpO2 99.00%. General appearance: alert, cooperative and no distress Resp: clear to auscultation bilaterally Cardio: regular rate and rhythm GI: normal findings: bowel sounds normal and soft, non-tender mumur unchanged. minimal anxiety  Lab results: Results for orders placed during the hospital encounter of 11/28/11 (from the past 24 hour(s))  GLUCOSE, CAPILLARY     Status: Abnormal   Collection Time   11/30/11  4:17 PM      Component Value Range   Glucose-Capillary 119 (*) 70 - 99 mg/dL   Comment 1 Notify RN    GLUCOSE, CAPILLARY     Status: Abnormal   Collection Time   11/30/11  8:38 PM      Component Value Range   Glucose-Capillary 117 (*) 70 - 99 mg/dL    GLUCOSE, CAPILLARY     Status: Abnormal   Collection Time   12/01/11 12:24 AM      Component Value Range   Glucose-Capillary 114 (*) 70 - 99 mg/dL   Comment 1 Notify RN     Comment 2 Documented in Chart      Imaging results:  Dg Orthopantogram  11/30/2011  *RADIOLOGY REPORT*  Clinical Data: Pain.  Possible abscess.  ORTHOPANTOGRAM/PANORAMIC  Comparison: None.  Findings: There has been considerable previous dental work with root canals, fillings and caps.  There is no evidence of significant periodontal disease and certainly no evidence of peri apical abscess affecting either the maxillary or mandibular dentition.  This technique is not sensitive for minor caries.  IMPRESSION: No evidence of advanced decay or advanced periodontal disease. Extensive previous dental work as above.   Original Report Authenticated By: Thomasenia Sales, M.D.    Dg Chest 2 View  11/30/2011  *RADIOLOGY REPORT*  Clinical Data: Preop for cardiac surgery  CHEST - 2 VIEW  Comparison: Portable chest x-ray of 11/28/2011  Findings: No active infiltrate or effusion is seen.  There is mild cardiomegaly present.  An aortic valve replacement is noted.  IMPRESSION: Stable mild cardiomegaly.  No active lung disease.   Original Report  Authenticated By: Juline Patch, M.D.     Assessment and Plan: I agree with the formulated Assessment and Plan with the following changes:  Syncope  Severe AS Possible re-do AVR  Appreciate dental eval, Cv and CVTS Await BCx- no growth so far Improve his anxiety and sleep rx

## 2011-12-01 NOTE — Progress Notes (Signed)
12/01/2011  Patient:            [name] Date of Birth:  July 18, 1945 MRN:                [id number]  BP 117/88  Pulse 117  Temp 98.2 F (36.8 C) (Oral)  Resp 20  Ht 5\' 9"  (1.753 m)  Wt 182 lb 8.7 oz (82.8 kg)  BMI 26.96 kg/m2  SpO2 99%  Lab Results  Component Value Date   WBC 5.9 11/30/2011   HGB 8.7* 11/30/2011   HCT 27.7* 11/30/2011   MCV 91.1 11/30/2011   PLT 213 11/30/2011   BMET    Component Value Date/Time   NA 135 11/30/2011 0635   K 3.5 11/30/2011 0635   CL 97 11/30/2011 0635   CO2 28 11/30/2011 0635   GLUCOSE 106* 11/30/2011 0635   BUN 10 11/30/2011 0635   CREATININE 0.72 11/30/2011 0635   CALCIUM 9.9 11/30/2011 0635   GFRNONAA >90 11/30/2011 0635   GFRAA >90 11/30/2011 0635   [name] is now seen to further discuss dental treatment options. Dental evaluation by an endodontist as an outpatient is not an option due to the patient's severe aortic stenosis and current symptomatology per discussion with cardiology team. Patient currently is denying acute dental pain.  Patient denies having any previous sinus infections or sinus disorders.  Patient denies having any previous symptoms from traumatic occlusion or chewing dysfunction. Patient denies having any acute or temporal mandibular joint problems.  Exam: No acute dental changes.  Assessments: Chronic periodontitis, accretions, and history of nonspecific upper right quadrant pain which has been asymptomatic for the past 48 hours by patient report.  Plan/recommendations:  1. I discussed the risks, benefits, and complications of various treatment options with the patient at this time. We discussed no treatment, selective extractions tooth #2 with alveoloplasty with periodontal therapy in the operating room, or deferring all dental treatment until the aortic valve has been revised.  Patient currently wishes not to have any dental treatment at this time and would like to proceed with the aortic valve  revision with Dr. Cornelius Moras.  Patient will then followup for dental care once medically stable from the  heart valve surgery.  In the meantime, the patient will continue on chlorhexidine rinses on a twice daily basis. This will be used over the next 2-3 months.  The patient is to contact dental medicine if acute problems arise in the interim time frame.  2. Discussion with medical team and coordination of future dental care as indicated

## 2011-12-02 DIAGNOSIS — I359 Nonrheumatic aortic valve disorder, unspecified: Secondary | ICD-10-CM

## 2011-12-02 DIAGNOSIS — R55 Syncope and collapse: Secondary | ICD-10-CM | POA: Diagnosis not present

## 2011-12-02 LAB — TYPE AND SCREEN

## 2011-12-02 LAB — CBC
MCH: 27.3 pg (ref 26.0–34.0)
MCHC: 30.9 g/dL (ref 30.0–36.0)
MCV: 88.5 fL (ref 78.0–100.0)
Platelets: 223 10*3/uL (ref 150–400)
RBC: 3.22 MIL/uL — ABNORMAL LOW (ref 4.22–5.81)

## 2011-12-02 LAB — BASIC METABOLIC PANEL
CO2: 24 mEq/L (ref 19–32)
Calcium: 9.9 mg/dL (ref 8.4–10.5)
Creatinine, Ser: 0.69 mg/dL (ref 0.50–1.35)

## 2011-12-02 MED ORDER — LORATADINE 10 MG PO TABS
10.0000 mg | ORAL_TABLET | Freq: Every day | ORAL | Status: DC
  Administered 2011-12-02 – 2011-12-05 (×4): 10 mg via ORAL
  Filled 2011-12-02 (×5): qty 1

## 2011-12-02 MED ORDER — DOCUSATE SODIUM 100 MG PO CAPS
100.0000 mg | ORAL_CAPSULE | Freq: Two times a day (BID) | ORAL | Status: DC
  Administered 2011-12-02 – 2011-12-03 (×4): 100 mg via ORAL
  Filled 2011-12-02 (×7): qty 1

## 2011-12-02 NOTE — Progress Notes (Signed)
Subjective:    Interval Events:  Patient slept well last night with zolpidem.  He also reports only a very mild headache overnight.  He is still complaining of the face "tightness".  He denies chest pain and dyspnea.  He is having regular BMs but the stool is hard and it requires straining.  He is also complaining of a "skin tag" on his left lumbar back that is starting to bother him.  He is also complaining of some post-nasal drip.    Objective:    Vital Signs:   Filed Vitals:   12/01/11 1353 12/01/11 2158 12/01/11 2300 12/02/11 0358  BP: 117/88 150/95 121/84 104/72  Pulse: 117 95  88  Temp: 98.2 F (36.8 C) 98.5 F (36.9 C)  98.3 F (36.8 C)  TempSrc: Oral Oral  Oral  Resp: 20 18  18   Height:      Weight:    181 lb 3.5 oz (82.2 kg)  SpO2: 99% 100%  95%   Last BM Date: 11/30/11   Weights: 24-hour Weight change: -1 lb 5.2 oz (-0.6 kg)  Filed Weights   11/30/11 0609 12/01/11 0607 12/02/11 0358  Weight: 184 lb 8 oz (83.689 kg) 182 lb 8.7 oz (82.8 kg) 181 lb 3.5 oz (82.2 kg)   Net since admission:  -4.6kg   Intake/Output:   Intake/Output Summary (Last 24 hours) at 12/02/11 1610 Last data filed at 12/02/11 0500  Gross per 24 hour  Intake   1320 ml  Output   1700 ml  Net   -380 ml     Net since admission:  -2.1L   Physical Exam: GENERAL:  alert and oriented; resting comfortably in bed and in no distress EYES:  pupils equal, round, and reactive to light; sclera anicteric ENT:  moist mucosa, oropharynx clear LUNGS:  bibasilar rales, normal work of breathing HEART:  normal rate; regular rhythm; 3/6 systolic murmur; no rubs, clicks, or gallops ABDOMEN:  soft, non-tender, normal bowel sounds, no masses palpated EXTREMITIES:  no edema SKIN:  normal turgor    Labs: Basic Metabolic Panel:  Lab 12/02/11 9604 11/30/11 0635 11/29/11 0615 11/28/11 0601  NA 135 135 135 132*  K 3.5 3.5 3.6 4.3  CL 100 97 95* 98  CO2 24 28 29 27   GLUCOSE 96 106* 148* 135*  BUN 10  10 14 14   CREATININE 0.69 0.72 0.81 0.75  CALCIUM 9.9 9.9 10.1 --  MG -- -- -- --  PHOS -- -- -- --   CBC:  Lab 12/02/11 0515 11/30/11 0635 11/29/11 0615 11/28/11 0601  WBC 5.4 5.9 6.6 9.0  NEUTROABS -- -- -- 7.3  HGB 8.8* 8.7* 8.7* 8.5*  HCT 28.5* 27.7* 28.5* 27.0*  MCV 88.5 91.1 92.5 90.9  PLT 223 213 208 191   Microbiology: Results for orders placed during the hospital encounter of 11/28/11  CULTURE, BLOOD (ROUTINE X 2)     Status: Normal (Preliminary result)   Collection Time   11/29/11 10:30 AM      Component Value Range Status Comment   Specimen Description BLOOD LEFT FOREARM   Final    Special Requests BOTTLES DRAWN AEROBIC AND ANAEROBIC 10CC   Final    Culture  Setup Time 11/29/2011 14:45   Final    Culture     Final    Value:        BLOOD CULTURE RECEIVED NO GROWTH TO DATE CULTURE WILL BE HELD FOR 5 DAYS BEFORE ISSUING A FINAL NEGATIVE REPORT  Report Status PENDING   Incomplete   CULTURE, BLOOD (ROUTINE X 2)     Status: Normal (Preliminary result)   Collection Time   11/29/11 10:32 AM      Component Value Range Status Comment   Specimen Description BLOOD RIGHT WRIST   Final    Special Requests BOTTLES DRAWN AEROBIC ONLY 10CC   Final    Culture  Setup Time 11/29/2011 14:46   Final    Culture     Final    Value:        BLOOD CULTURE RECEIVED NO GROWTH TO DATE CULTURE WILL BE HELD FOR 5 DAYS BEFORE ISSUING A FINAL NEGATIVE REPORT   Report Status PENDING   Incomplete     Other results: Imaging: No results found.    Medications:    Infusions:     Scheduled Medications:    . albuterol  2.5 mg Nebulization Once  . aspirin EC  81 mg Oral Daily  . carbidopa-levodopa  2 tablet Oral Q breakfast  . chlorhexidine  15 mL Mouth/Throat BID  . cloNIDine  0.1 mg Oral BID  . enoxaparin (LOVENOX) injection  40 mg Subcutaneous Q24H  . ferrous gluconate  324 mg Oral BID WC  . finasteride  5 mg Oral Daily  . furosemide  20 mg Oral Daily  . gabapentin  300 mg Oral BID   . omega-3 acid ethyl esters  1 g Oral BID  . pantoprazole  40 mg Oral Daily  . senna-docusate  1 tablet Oral BID  . sodium chloride  3 mL Intravenous Q12H  . zolpidem  5 mg Oral QHS  . DISCONTD: carbidopa-levodopa  1 tablet Oral BID  . DISCONTD: traZODone  50 mg Oral QHS     PRN Medications: alum & mag hydroxide-simeth, ondansetron (ZOFRAN) IV, ondansetron, DISCONTD: LORazepam    Assessment/ Plan:     1.   Syncope:  This is multifactorial.  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  This, coupled with 100mg  of metoprolol BID and chronic anemia with hemoglobins in the 8s has resulted in syncope.  Arrhythmias are unlikely since these episodes he has been having have not been sudden and there is always warning signs.  Seizure activity is less likely, as one would expect seizure-like activity and post-ictal confusion. Cardiothoracic surgery has seen the patient and will follow. - stopped metoprolol - need recs from CTS regarding valve revision   2.   Chronic daily headache:  This too is likely multifactorial.  Parts of his history are consistent with morning headaches secondary to sleep apnea.  He has documented sleep apnea, but has refused to consistently use CPAP at night.  This does not, however, explain all of his headaches.  His history is consistent with migraine headaches and rebound headaches.  He may have a long history of tension headaches, for which he has self medicated with over the counter analgesia, leading now to rebound headaches; indeed he reports frequent acetaminophen use over the past few weeks.  Alternatively, he may be experiencing migraine headaches.  Photophobia and unilateral pain support this.  His history is not consistent with cluster headaches.  Perhaps clonidine and relative hypotension is contributing as well.  We have stopped APAP and oxycodone.  Ergotamines are probably not a good  alternative treatment with his heart issues, but prochlorperazine (5 to 10 mg every 6 hours) with diphenhydramine (12.5 mg every 6 hours) may be a reasonable alternative.  We  have also tapered the clonidine dose to 0.1mg  BID.  His headaches are decreasing in intensity. - patient has no PRN analgesics ordered, will try 10mg  prochlorperazine + 12.5mg  diphenhydramine if headache recurs  3.   Iron deficiency anemia:  Patient is followed by Dr. Leone Payor for a gastrointestinal tract bleed that Dr. Leone Payor has been unable to identify.  The patient has previously refused capsule endoscopy.  In September, iron panel was consistent with iron deficiency.  An infusion of ferumoxytol was administered by Dr. Leone Payor, but the patient experienced lower extremity edema.  I have talked with hematology and they suggest any of the other iron preperations, either iron dextran or iron sucrose.  Alternatively, he may need a blood transfusion.  We will wait to hear from cardiothoracic surgery before treating the anemia intravenously.  He does take 324mg  of ferrous gluconate daily at home.  Will start this BID.  Hemoglobin is stable. - consider IV iron dextran, iron sucrose, or blood tranfusion - ferrous gluconate 324mg  BID   4.   Elevated proBNP:  This may represent congestive heart failure secondary to critical aortic stenosis.  Echocardiography demonstrated normal systolic function but critical aortic stenosis and grade I diastolic dysfunction.  Chest roentgenogram was also consistent with pulmonary edema and he had trace pedal edema.  According to his records, he experienced some edema and depression from oral furosemide that was prescribed around 2 weeks prior to admission.  He responded well to IV furosemide on 10/21, so we have restarted him on daily furosemide 20mg .  His pulmonary exam demonstrates some bibasilar rales, but no peripheral edema is appreciated. - continue furosemide 20mg  daily  5.   Hypertension:  Blood  pressure is under control currently.  At home, he takes clonidine 0.2mg  BID, HCTZ 12.5mg  daily, losartan 100mg  daily, and metoprolol 100mg  BID.  He was also recently started on furosemide 20mg  daily for peripheral edema.  We have stopped his metoprolol as was bradycardic and symptomatic with syncope.  We have also held his HCTZ and losartan.  We have restarted his furosemide at 20mg  daily, and we have decreased the clonidine dose to 0.1mg  BID.  Blood pressures well controlled. - continue clonidine 0.1mg  BID - hold HCTZ and losartan - stopped metoprolol  6.   Diabetes:  Glucose has been well controlled here with minimal insulin.  Last hemoglobin A1c is 7, it is currently 4.4 as a result of his anemia.  We will stop correctional insulin and CBG checks.  Fasting blood glucose this morning was 96. - stop correctional insulin  7.   Dyslipidemia:  At home, he takes fish oil.  Fasting lipid panel is normal with an LDL of 50. - continue fish oil  8.   Hyponatremia:  Was low at 132 at admission and he has run low in the past around 131 to 132.  After restarting furosemide, it has increased and remained stable at 135.  9.   Restless leg syndrome:  Patient says the clonidine was prescribed for hypertension not restless leg.  The medicines he takes for restless leg are carbidopa-levodopa and gabapentin.  For some time now, he has been taking 2 tablets of carbidopa-levodopa with breakfast rather than the BID dosing we have been using here.  He says the once a day dosing works better. - continue carbidopa-levodopa 2 tablets with breakfast - continue gabapentin BID  10. Insomnia:  Anxiety is likely playing a big part in this.  His headaches are also probably contributing to this and  maybe caused by this.  He did not respond well to trazodone last night, but he says he has used Ambien before with good effect.  5mg  zolpidem worked well last night. - continue zolpidem 5mg  qHS  11. Dysuria:  Exam of genitalia is  normal and urinalysis from October 22 is entirely normal, negative for hemoglobin, leukocyte esterase, and nitrite.  He is established with a urologist and plans to follow up with him once he is squared away with his heart.  I do not suspect a urinary tract infection.  12. Constipation:  Patient is having regular bowel movements but the stool is hard and requires straining to pass.  He is currently on senna-docusate BID which has 50mg  of docusate per tablet.  We will switch to just docusate but 100mg  BID. - docusate 100mg  BID  13. Post-nasal drainage:  No purulance or evidence of viral or bacterial infection.  Likely allergic rhinosinusitis.  - loratadine 10mg  daily  14. Prophylaxis: - enoxaparin for VTE prophylaxis  15. Disposition: deferred to cardiology and cardiothoracic surgery    Length of Stay: 4 days   Signed by:  Dorthula Rue. Earlene Plater, MD PGY-I, Internal Medicine Pager [phone]  12/02/2011, 11:03 AM

## 2011-12-02 NOTE — Progress Notes (Signed)
Internal Medicine Teaching Service Attending Note Date: 12/02/2011  Patient name: [name]  Medical record number: [id number]  Date of birth: 11-02-1945   I have seen and evaluated [name] and discussed their care with the Residency Team.   Concerned about tightness on L side of face.     Marland Kitchen aspirin EC  81 mg Oral Daily  . carbidopa-levodopa  2 tablet Oral Q breakfast  . chlorhexidine  15 mL Mouth/Throat BID  . cloNIDine  0.1 mg Oral BID  . docusate sodium  100 mg Oral BID  . enoxaparin (LOVENOX) injection  40 mg Subcutaneous Q24H  . ferrous gluconate  324 mg Oral BID WC  . finasteride  5 mg Oral Daily  . furosemide  20 mg Oral Daily  . gabapentin  300 mg Oral BID  . loratadine  10 mg Oral Daily  . omega-3 acid ethyl esters  1 g Oral BID  . pantoprazole  40 mg Oral Daily  . sodium chloride  3 mL Intravenous Q12H  . zolpidem  5 mg Oral QHS  . DISCONTD: senna-docusate  1 tablet Oral BID     Physical Exam: Blood pressure 108/70, pulse 88, temperature 98.3 F (36.8 C), temperature source Oral, resp. rate 18, height 5\' 9"  (1.753 m), weight 82.2 kg (181 lb 3.5 oz), SpO2 95.00%. General appearance: alert, cooperative and no distress Head: Normocephalic, without obvious abnormality, no temporal artery tenderness.  Resp: clear to auscultation bilaterally Cardio: regular rate and rhythm and SEM unchanged GI: normal findings: bowel sounds normal and soft, non-tender  Lab results: Results for orders placed during the hospital encounter of 11/28/11 (from the past 24 hour(s))  CBC     Status: Abnormal   Collection Time   12/02/11  5:15 AM      Component Value Range   WBC 5.4  4.0 - 10.5 K/uL   RBC 3.22 (*) 4.22 - 5.81 MIL/uL   Hemoglobin 8.8 (*) 13.0 - 17.0 g/dL   HCT 40.9 (*) 81.1 - 91.4 %   MCV 88.5  78.0 - 100.0 fL   MCH 27.3  26.0 - 34.0 pg   MCHC 30.9  30.0 - 36.0 g/dL   RDW 78.2 (*) 95.6 - 21.3 %   Platelets 223  150 - 400 K/uL  BASIC METABOLIC PANEL      Status: Normal   Collection Time   12/02/11  5:15 AM      Component Value Range   Sodium 135  135 - 145 mEq/L   Potassium 3.5  3.5 - 5.1 mEq/L   Chloride 100  96 - 112 mEq/L   CO2 24  19 - 32 mEq/L   Glucose, Bld 96  70 - 99 mg/dL   BUN 10  6 - 23 mg/dL   Creatinine, Ser 0.86  0.50 - 1.35 mg/dL   Calcium 9.9  8.4 - 57.8 mg/dL   GFR calc non Af Amer >90  >90 mL/min   GFR calc Af Amer >90  >90 mL/min    Imaging results:  No results found.  Assessment and Plan: I agree with the formulated Assessment and Plan with the following changes:  AS- severe,  Plan is for him to have AVR on 10-29.  Greatly appreciate Dr Orvan July f/u

## 2011-12-02 NOTE — Care Management Note (Addendum)
    Page 1 of 2   12/13/2011     3:13:05 PM   CARE MANAGEMENT NOTE 12/13/2011  Patient:  [name], [name]   Account Number:  [id number]  Date Initiated:  12/02/2011  Documentation initiated by:  Tera Mater  Subjective/Objective Assessment:   66yo male admitted with  Syncope.  Pt. lives at home with spouse.     Action/Plan:   Pt. had cardiac cath on 10/23 which revealed severe stenosis of Mitral Valve and TCTS was consulted. Pt. will have AVR on 10/29.   Anticipated DC Date:  12/13/2011   Anticipated DC Plan:  HOME W HOME HEALTH SERVICES      DC Planning Services  CM consult      [facility] Choice  HOME HEALTH   Choice offered to / List presented to:  C-1 Patient   DME arranged  Levan Hurst      DME agency  [facility]     [facility] arranged  HH-1 RN      [facility] agency  [facility]   Status of service:  Completed, signed off Medicare Important Message given?   (If response is "NO", the following Medicare IM given date fields will be blank) Date Medicare IM given:   Date Additional Medicare IM given:    Discharge Disposition:  HOME W HOME HEALTH SERVICES  Per UR Regulation:  Reviewed for med. necessity/level of care/duration of stay  If discussed at Long Length of Stay Meetings, dates discussed:   12/06/2011  12/08/2011    Comments:  12/12/11 Alashia Brownfield,RN,BSN [phone] MET WITH PT AND WIFE TO FINALIZE DC PLANS.  WIFE TO PROVIDE 24H CARE AT DC.  PT AND WIFE AGREEABLE TO HHRN FOR RESTORATIVE CARE AT DC; REFERRAL TO [facility], PER PT CHOICE. START OF CARE 24-48H POST DC DATE.  12/11/2011 1600 NCM spoke to pt and gave permission to speak with wife, Dennie Bible. States he did receive RW to his room. No additional DME requested. Provided pt with [facility] list for possible d/c home with [facility] RN to follow up post hospital d/c. Isidoro Donning RN CCM Case Mgmt phone [phone]  12-07-11 8:10am Avie Arenas, RNBSN - [phone] Post op AVR on 12-05-11 - progressing - CM  will continue to follow.  12/05/11 1557 Will have surgery tomorrow.  Tera Mater, RN, BSN [place] [phone]   12/02/11 1631 Pt. will have AVR on 10/29.  Will mostly likely go home on dc with HH services.  Pt. has supportive spouse and lives here in [place].  NCM to follow for dc needs.  Tera Mater, RN, BSN NCM [phone]

## 2011-12-02 NOTE — Progress Notes (Signed)
Patient: [name] Date of Encounter: 12/02/2011, 8:49 AM Admit date: 11/28/2011     Subjective  [name] has no new complaints this AM.   Objective  Physical Exam: Vitals: BP 104/72  Pulse 88  Temp 98.3 F (36.8 C) (Oral)  Resp 18  Ht 5\' 9"  (1.753 m)  Wt 181 lb 3.5 oz (82.2 kg)  BMI 26.76 kg/m2  SpO2 95% General: Well developed, pale 66 year old male in no acute distress. Neck: Supple. JVD not elevated. Lungs: Clear bilaterally to auscultation without wheezes, rales, or rhonchi. Breathing is unlabored. Heart: RRR S1 S2 with harsh holosystolic murmur, heard best at right 2nd ICS. No diastolic murmur, rub or gallop.  Abdomen: Soft, non-distended. Extremities: No clubbing or cyanosis. No edema.  Distal pedal pulses are 2+ and equal bilaterally. Neuro: Alert and oriented X 3. Moves all extremities spontaneously. No focal deficits.  Intake/Output:  Intake/Output Summary (Last 24 hours) at 12/02/11 0849 Last data filed at 12/02/11 0834  Gross per 24 hour  Intake   1320 ml  Output   1700 ml  Net   -380 ml    Inpatient Medications:     . albuterol  2.5 mg Nebulization Once  . aspirin EC  81 mg Oral Daily  . carbidopa-levodopa  2 tablet Oral Q breakfast  . chlorhexidine  15 mL Mouth/Throat BID  . cloNIDine  0.1 mg Oral BID  . enoxaparin (LOVENOX) injection  40 mg Subcutaneous Q24H  . ferrous gluconate  324 mg Oral BID WC  . finasteride  5 mg Oral Daily  . furosemide  20 mg Oral Daily  . gabapentin  300 mg Oral BID  . omega-3 acid ethyl esters  1 g Oral BID  . pantoprazole  40 mg Oral Daily  . senna-docusate  1 tablet Oral BID  . sodium chloride  3 mL Intravenous Q12H  . zolpidem  5 mg Oral QHS  . DISCONTD: carbidopa-levodopa  1 tablet Oral BID  . DISCONTD: traZODone  50 mg Oral QHS    Labs:  [facility] 12/02/11 0515 11/30/11 0635  NA 135 135  K 3.5 3.5  CL 100 97  CO2 24 28  GLUCOSE 96 106*  BUN 10 10  CREATININE 0.69 0.72  CALCIUM 9.9 9.9    MG -- --  PHOS -- --    Basename 11/30/11 0635  AST 52*  ALT <5  ALKPHOS 54  BILITOT 1.3*  PROT 6.3  ALBUMIN 3.8    Basename 12/02/11 0515 11/30/11 0635  WBC 5.4 5.9  NEUTROABS -- --  HGB 8.8* 8.7*  HCT 28.5* 27.7*  MCV 88.5 91.1  PLT 223 213    Radiology/Studies: Dg Orthopantogram  11/30/2011  *RADIOLOGY REPORT*  Clinical Data: Pain.  Possible abscess.  ORTHOPANTOGRAM/PANORAMIC  Comparison: None.  Findings: There has been considerable previous dental work with root canals, fillings and caps.  There is no evidence of significant periodontal disease and certainly no evidence of peri apical abscess affecting either the maxillary or mandibular dentition.  This technique is not sensitive for minor caries.  IMPRESSION: No evidence of advanced decay or advanced periodontal disease. Extensive previous dental work as above.   Original Report Authenticated By: Thomasenia Sales, M.D.    Dg Chest 2 View  11/30/2011  *RADIOLOGY REPORT*  Clinical Data: Preop for cardiac surgery  CHEST - 2 VIEW  Comparison: Portable chest x-ray of 11/28/2011  Findings: No active infiltrate or effusion is seen.  There  is mild cardiomegaly present.  An aortic valve replacement is noted.  IMPRESSION: Stable mild cardiomegaly.  No active lung disease.   Original Report Authenticated By: Juline Patch, M.D.    Dg Chest Port 1 View  11/28/2011  *RADIOLOGY REPORT*  Clinical Data: Syncope.  PORTABLE CHEST - 1 VIEW  Comparison: 08/31/2011.  Findings: Trachea is midline.  Heart is enlarged.  Lungs are low in volume with diffuse mixed interstitial and air space disease.  No definite pleural fluid.  IMPRESSION: Low lung volumes with presumed pulmonary edema.   Original Report Authenticated By: Reyes Ivan, M.D.     Telemetry: sinus rhythm; no arrhythmias   Assessment and Plan  1. Severe aortic valve stenosis, bioprosthetic aortic valve: Cardiac cath 10/23 with 110 mmHg gradient across aortic valve secondary to  stenosis. No evidence of obstructive CAD. Further plans for valve replacement per Dr. Cornelius Moras.  2. Syncope: Presumed secondary to severe valvular disease, aggravated by antihypertensives and anemia.   Further plans per CT surgery, Dr. Cornelius Moras. Will sign off. Please call with any further questions. Signed, Exie Parody  Cardiology Attending  Agree with above exam, assessment and plan.  Leonia Reeves.D.

## 2011-12-02 NOTE — Progress Notes (Signed)
Spoke with [name] about his CPAP. He says he won't be wearing it tonight. Says sometimes CPAP gives him a headache.

## 2011-12-02 NOTE — Progress Notes (Addendum)
CARDIOTHORACIC SURGERY PROGRESS NOTE  2 Days Post-Op  S/P Procedure(s) (LRB): LEFT HEART CATHETERIZATION WITH CORONARY ANGIOGRAM (N/A) RIGHT HEART CATH (Bilateral)  Subjective: No complaints  Objective: Vital signs in last 24 hours: Temp:  [98.2 F (36.8 C)-98.5 F (36.9 C)] 98.3 F (36.8 C) (10/25 0358) Pulse Rate:  [88-117] 88  (10/25 0358) Cardiac Rhythm:  [-] Normal sinus rhythm (10/25 0800) Resp:  [18-20] 18  (10/25 0358) BP: (104-150)/(70-95) 108/70 mmHg (10/25 1019) SpO2:  [95 %-100 %] 95 % (10/25 0358) Weight:  [82.2 kg (181 lb 3.5 oz)] 82.2 kg (181 lb 3.5 oz) (10/25 0358)  Physical Exam:  Rhythm:   sinus  Breath sounds: clear  Heart sounds:  RRR w/ prominent systolic murmur  Incisions:  n/a  Abdomen:  soft  Extremities:  warm   Intake/Output from previous day: 10/24 0701 - 10/25 0700 In: 1320 [P.O.:1320] Out: 1700 [Urine:1700] Intake/Output this shift: Total I/O In: 240 [P.O.:240] Out: -   Lab Results:  Basename 12/02/11 0515 11/30/11 0635  WBC 5.4 5.9  HGB 8.8* 8.7*  HCT 28.5* 27.7*  PLT 223 213   BMET:  Basename 12/02/11 0515 11/30/11 0635  NA 135 135  K 3.5 3.5  CL 100 97  CO2 24 28  GLUCOSE 96 106*  BUN 10 10  CREATININE 0.69 0.72  CALCIUM 9.9 9.9    CBG (last 3)   Basename 12/01/11 0024 11/30/11 2038 11/30/11 1617  GLUCAP 114* 117* 119*   PT/INR:  No results found for this basename: LABPROT,INR in the last 72 hours  CXR:  *RADIOLOGY REPORT*  Clinical Data: Preop for cardiac surgery  CHEST - 2 VIEW  Comparison: Portable chest x-ray of 11/28/2011  Findings: No active infiltrate or effusion is seen. There is mild  cardiomegaly present. An aortic valve replacement is noted.  IMPRESSION:  Stable mild cardiomegaly. No active lung disease.  Original Report Authenticated By: Juline Patch, M.D.     Noninvasive Vascular Lab  Preoperative Vascular Evaluation  Patient: Muktar, Blomberg MR #: [id number] Study Date:  12/01/2011 Gender: M Age: 66 Height: Weight: BSA: Pt. Status: Room: 4742  Rutherford Limerick MD REFERRING Tressie Stalker MD ADMITTING Ginnie Smart ATTENDING Ginnie Smart SONOGRAPHER Farrel Demark, RVT, RDMS Reports also to:  ------------------------------------------------------------ History and indications:  Indications  424.1 Aortic stenosis/insufficiency. V72.81 Screening Pre-operative. History  Pre-op evaluation  ------------------------------------------------------------ Study information:  Study status: Routine. Procedure: A vascular evaluation was performed. Image quality was adequate. Preoperative vascular evaluation for heart surgery. Carotid duplex exam per standard extablished protocols, limited upper extremity arterial evaluation and ABIs as indicated. Location: Vascular laboratory. Patient status: Inpatient.  Arterial flow:  ------------------------------------------------------------ Summary:  - No significant extracranial carotid artery stenosis demonstrated. Vertebrals are patent with antegrade flow. - Right ICA/ CCA ratio= 1.15. Left ICA/ CCA ratio= 1.71. Prepared and Electronically Authenticated by  Cari Caraway    Cardiac Catheterization Operative Report  JAMARR PERSAD  [id number]  10/23/20139:31 AM  Provider Not In System  Procedure Performed:  1. Left Heart Catheterization 2. Selective Coronary Angiography 3. Left ventricular pressues 4. Right heart catheterization Operator: Verne Carrow, MD  Arterial access site: Right radial artery. Left femoral vein.  Indication: Severe aortic valve stenosis. Syncope.  Procedure Details:  The risks, benefits, complications, treatment options, and expected outcomes were discussed with the patient. The patient and/or family concurred with the proposed plan, giving informed consent. The patient was brought to the cath lab after IV hydration was  begun and oral  premedication was given. The patient was further sedated with Versed and Fentanyl. The left groin was prepped and draped in a sterile fashion. 1% Lidocaine was used for local anesthesia. The right wrist was assessed with an Allens test which was positive. The right wrist was prepped and draped in a sterile fashion. 1% lidocaine was used for local anesthesia. Using the modified Seldinger access technique, a 5 French sheath was placed in the right radial artery. 3 mg Verapamil was given through the sheath. 4500 units IV heparin was given. A right heart catheterization was performed with a multi-purpose catheter. Standard diagnostic catheters were used to perform selective coronary angiography. I was able to cross the bioprosthetic aortic valve with a straight stiff wire using the JR4 catheter for angulation. The sheath was removed from the right radial artery and a Terumo hemostasis band was applied at the arteriotomy site on the right wrist.  There were no immediate complications. The patient was taken to the recovery area in stable condition.  Hemodynamic Findings:  Central aortic pressure: 111/64  Left ventricular pressure: 214/11/21  RA: 6  RV: 36/5/10  PA: 37/8 (mean 23)  PCWP: 15  AO: 96%  PA: 71%  CO: 8.9 L/min (by Fick)  CI: 4.5 L/min/m2 (by Fick  AVA: 0.94 cm2  Angiographic Findings:  Left main: No obstructive disease.  Left Anterior Descending Artery: Large caliber vessel that courses to the apex. No significant diagonal branches. No obstructive disease.  Circumflex Artery: Large caliber vessel with no obstructive disease. Moderate caliber intermediate branch with no obstructive disease.  Right Coronary Artery: Large dominant vessel with no obstructive disease.  Left Ventricular Angiogram: Deferred.  Impression:  1. No angiographic evidence of CAD  2. 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 (110 mm peak to peak gradient with AVA of 0.94)  Recommendations: Would proceed with  planning for aortic valve revision.  Complications: None. The patient tolerated the procedure well.   [id number]   Assessment/Plan: S/P Procedure(s) (LRB): LEFT HEART CATHETERIZATION WITH CORONARY ANGIOGRAM (N/A) RIGHT HEART CATH (Bilateral)  Dental consultation and cath results noted and appreciated.  We plan redo AVR with possible need for aortic root replacement on Tuesday 10/29 using a bioprosthetic tissue valve.  I spent in excess of 30 minutes at the bedside with Mr Khera and his wife.  The patient understands and accepts all potential associated risks of surgery including but not limited to risk of death, stroke, myocardial infarction, congestive heart failure, respiratory failure, renal failure, bleeding requiring blood transfusion and/or reexploration, arrhythmia, heart block or bradycardia requiring permanent pacemaker, pneumonia, pleural effusion, wound infection, pulmonary embolus or other thromboembolic complication, chronic pain or other delayed complications including structural valve deterioration and failure.  All questions answered.      Dejean Tribby H 12/02/2011 12:56 PM

## 2011-12-03 MED ORDER — NAPROXEN 500 MG PO TABS
500.0000 mg | ORAL_TABLET | Freq: Once | ORAL | Status: AC
  Administered 2011-12-03: 500 mg via ORAL
  Filled 2011-12-03: qty 1

## 2011-12-03 NOTE — Progress Notes (Signed)
Subjective:    Interval Events:  Patient did not sleep well last night.  He awoke several times during the night.  He had a mild headache this morning after waking but his resolved quickly without intervention.  He is frustrated by the time it takes his nurses to attend to him when he needs to use the bathroom.    Objective:    Vital Signs:   Temp:  [97.7 F (36.5 C)-98.4 F (36.9 C)] 97.7 F (36.5 C) (10/26 0508) Pulse Rate:  [89-104] 104  (10/26 0508) Resp:  [18-20] 18  (10/26 0508) BP: (108-129)/(69-81) 108/72 mmHg (10/26 0508) SpO2:  [97 %-100 %] 100 % (10/26 0508) Weight:  [181 lb 7 oz (82.3 kg)] 181 lb 7 oz (82.3 kg) (10/26 0508) Last BM Date: 11/30/11   Weights: 24-hour Weight change: 3.5 oz (0.1 kg)  Filed Weights   12/01/11 0607 12/02/11 0358 12/03/11 0508  Weight: 182 lb 8.7 oz (82.8 kg) 181 lb 3.5 oz (82.2 kg) 181 lb 7 oz (82.3 kg)   Net since admission:  -4.5kg   Intake/Output:   Intake/Output Summary (Last 24 hours) at 12/03/11 1029 Last data filed at 12/03/11 5784  Gross per 24 hour  Intake    540 ml  Output   2825 ml  Net  -2285 ml     Net since admission:  -4.1L   Physical Exam: GENERAL:  alert and oriented; resting comfortably in bed and in no distress LUNGS:  clear to auscultation bilaterally, normal work of breathing HEART:  normal rate; regular rhythm; 3/6 systolic murmur; no rubs, clicks, or gallops EXTREMITIES:  no edema SKIN:  normal turgor    Labs: Microbiology: Results for orders placed during the hospital encounter of 11/28/11  CULTURE, BLOOD (ROUTINE X 2)     Status: Normal (Preliminary result)   Collection Time   11/29/11 10:30 AM      Component Value Range Status Comment   Specimen Description BLOOD LEFT FOREARM   Final    Special Requests BOTTLES DRAWN AEROBIC AND ANAEROBIC 10CC   Final    Culture  Setup Time 11/29/2011 14:45   Final    Culture     Final    Value:        BLOOD CULTURE RECEIVED NO GROWTH TO DATE CULTURE  WILL BE HELD FOR 5 DAYS BEFORE ISSUING A FINAL NEGATIVE REPORT   Report Status PENDING   Incomplete   CULTURE, BLOOD (ROUTINE X 2)     Status: Normal (Preliminary result)   Collection Time   11/29/11 10:32 AM      Component Value Range Status Comment   Specimen Description BLOOD RIGHT WRIST   Final    Special Requests BOTTLES DRAWN AEROBIC ONLY 10CC   Final    Culture  Setup Time 11/29/2011 14:46   Final    Culture     Final    Value:        BLOOD CULTURE RECEIVED NO GROWTH TO DATE CULTURE WILL BE HELD FOR 5 DAYS BEFORE ISSUING A FINAL NEGATIVE REPORT   Report Status PENDING   Incomplete     Other results: Imaging: No results found.    Medications:    Infusions:     Scheduled Medications:    . aspirin EC  81 mg Oral Daily  . carbidopa-levodopa  2 tablet Oral Q breakfast  . chlorhexidine  15 mL Mouth/Throat BID  . cloNIDine  0.1 mg Oral BID  . docusate sodium  100 mg Oral BID  . enoxaparin (LOVENOX) injection  40 mg Subcutaneous Q24H  . ferrous gluconate  324 mg Oral BID WC  . finasteride  5 mg Oral Daily  . furosemide  20 mg Oral Daily  . gabapentin  300 mg Oral BID  . loratadine  10 mg Oral Daily  . omega-3 acid ethyl esters  1 g Oral BID  . pantoprazole  40 mg Oral Daily  . sodium chloride  3 mL Intravenous Q12H  . zolpidem  5 mg Oral QHS  . DISCONTD: senna-docusate  1 tablet Oral BID     PRN Medications: alum & mag hydroxide-simeth, ondansetron (ZOFRAN) IV, ondansetron    Assessment/ Plan:     1.   Syncope:  This is multifactorial.  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  This, coupled with 100mg  of metoprolol BID and chronic anemia with hemoglobins in the 8s has resulted in syncope.  Arrhythmias are unlikely since these episodes he has been having have not been sudden and there is always warning signs.  Seizure activity is less likely, as one would expect seizure-like activity and  post-ictal confusion. Cardiothoracic surgery has seen the patient and will follow. - stopped metoprolol - surgery on Tuesday   2.   Chronic daily headache:  This too is likely multifactorial.  Parts of his history are consistent with morning headaches secondary to sleep apnea.  He has documented sleep apnea, but has refused to consistently use CPAP at night.  This does not, however, explain all of his headaches.  His history is consistent with migraine headaches and rebound headaches.  He may have a long history of tension headaches, for which he has self medicated with over the counter analgesia, leading now to rebound headaches; indeed he reports frequent acetaminophen use over the past few weeks.  Alternatively, he may be experiencing migraine headaches.  Photophobia and unilateral pain support this.  His history is not consistent with cluster headaches.  Perhaps clonidine and relative hypotension is contributing as well.  We have stopped APAP and oxycodone.  His headaches are decreasing in intensity.  This makes me think they were rebound in nature. - avoid acetaminophen  3.   Iron deficiency anemia:  Patient is followed by Dr. Leone Payor for a gastrointestinal tract bleed that Dr. Leone Payor has been unable to identify.  The patient has previously refused capsule endoscopy.  In September, iron panel was consistent with iron deficiency.  An infusion of ferumoxytol was administered by Dr. Leone Payor, but the patient experienced lower extremity edema.  I have talked with hematology and they suggest any of the other iron preperations, either iron dextran or iron sucrose.  Alternatively, he may need a blood transfusion.  We will wait to hear from cardiothoracic surgery before treating the anemia intravenously.  He does take 324mg  of ferrous gluconate daily at home.  Will start this BID.  Hemoglobin is stable. - consider IV iron dextran, iron sucrose, or blood tranfusion - oral ferrous gluconate 324mg  BID   4.    Elevated proBNP:  This may represent congestive heart failure secondary to critical aortic stenosis.  Echocardiography demonstrated normal systolic function but critical aortic stenosis and grade I diastolic dysfunction.  Chest roentgenogram was also consistent with pulmonary edema and he had trace pedal edema.  According to his records, he experienced some edema and depression from oral furosemide that was prescribed around 2 weeks prior to admission.  He responded well to  IV furosemide on 10/21, so we have restarted him on daily furosemide 20mg .   - continue furosemide 20mg  daily  5.   Hypertension:  Blood pressure is under control currently.  At home, he takes clonidine 0.2mg  BID, HCTZ 12.5mg  daily, losartan 100mg  daily, and metoprolol 100mg  BID.  He was also recently started on furosemide 20mg  daily for peripheral edema.  We have stopped his metoprolol as was bradycardic and symptomatic with syncope.  We have also held his HCTZ and losartan.  We have restarted his furosemide at 20mg  daily, and we have decreased the clonidine dose to 0.1mg  BID.  Blood pressures well controlled. - continue clonidine 0.1mg  BID - hold HCTZ and losartan - stopped metoprolol  6.   Diabetes:  Glucose has been well controlled here with minimal insulin.  Last hemoglobin A1c is 7, it is currently 4.4 as a result of his anemia.  We will stop correctional insulin and CBG checks.  Fasting blood glucose this morning was 96. - stop correctional insulin  7.   Dyslipidemia:  At home, he takes fish oil.  Fasting lipid panel is normal with an LDL of 50. - continue fish oil  8.   Hyponatremia:  Was low at 132 at admission and he has run low in the past around 131 to 132.  After restarting furosemide, it has increased and remained stable at 135.  9.   Restless leg syndrome:  Patient says the clonidine was prescribed for hypertension not restless leg.  The medicines he takes for restless leg are carbidopa-levodopa and gabapentin.  For  some time now, he has been taking 2 tablets of carbidopa-levodopa with breakfast rather than the BID dosing we have been using here.  He says the once a day dosing works better. - continue carbidopa-levodopa 2 tablets with breakfast - continue gabapentin BID  10. Insomnia:  Anxiety is likely playing a big part in this.  His headaches are also probably contributing to this and maybe caused by this.  He did not respond well to trazodone last night, but he says he has used Ambien before with good effect. - continue zolpidem 5mg  qHS  11. Dysuria:  Exam of genitalia is normal and urinalysis from October 22 is entirely normal, negative for hemoglobin, leukocyte esterase, and nitrite.  He is established with a urologist and plans to follow up with him once he is squared away with his heart.  I do not suspect a urinary tract infection.  12. Constipation:  Patient is having regular bowel movements but the stool is hard and requires straining to pass.  He is currently on senna-docusate BID which has 50mg  of docusate per tablet.  We will switch to just docusate but 100mg  BID. - docusate 100mg  BID  13. Post-nasal drainage:  No purulance or evidence of viral or bacterial infection.  Likely allergic rhinosinusitis.  Improving with loratadine. - loratadine 10mg  daily  14. Prophylaxis: - enoxaparin for VTE prophylaxis  15. Disposition: surgery Tuesday    Length of Stay: 5 days   Signed by:  Dorthula Rue. Earlene Plater, MD PGY-I, Internal Medicine Pager [phone]  12/03/2011, 10:29 AM

## 2011-12-04 MED ORDER — NAPROXEN 500 MG PO TABS
500.0000 mg | ORAL_TABLET | Freq: Once | ORAL | Status: AC
  Administered 2011-12-04: 500 mg via ORAL
  Filled 2011-12-04: qty 1

## 2011-12-04 MED ORDER — POLYETHYLENE GLYCOL 3350 17 G PO PACK
17.0000 g | PACK | Freq: Every day | ORAL | Status: DC
  Administered 2011-12-04 – 2011-12-05 (×2): 17 g via ORAL
  Filled 2011-12-04 (×3): qty 1

## 2011-12-04 MED ORDER — SENNOSIDES-DOCUSATE SODIUM 8.6-50 MG PO TABS
1.0000 | ORAL_TABLET | Freq: Two times a day (BID) | ORAL | Status: DC
  Administered 2011-12-04 – 2011-12-05 (×4): 1 via ORAL
  Filled 2011-12-04 (×6): qty 1

## 2011-12-04 NOTE — Progress Notes (Signed)
Patient states that he is not interested in wearing CPAP tonight. He has a headache and states that is makes him worse. RT discussed the importance of machine and pt still was not interested. RT will continue to monitor.

## 2011-12-04 NOTE — Progress Notes (Signed)
Subjective:    Currently, the patient is feeling fairly well, did have a HA this AM, resolved spontaneously. Also did have neck pain last night, treated with naproxen.  Interval Events: None.   Objective:    Vital Signs:   Temp:  [98.3 F (36.8 C)-98.4 F (36.9 C)] 98.4 F (36.9 C) (10/27 0500) Pulse Rate:  [82-91] 82  (10/27 0500) Resp:  [18] 18  (10/27 0500) BP: (98)/(66-73) 98/66 mmHg (10/27 0500) SpO2:  [97 %-98 %] 97 % (10/27 0500) Weight:  [179 lb 10.8 oz (81.5 kg)] 179 lb 10.8 oz (81.5 kg) (10/27 0500) Last BM Date: 11/30/11  24-hour weight change: Weight change: -1 lb 12.2 oz (-0.8 kg)  Intake/Output:   Intake/Output Summary (Last 24 hours) at 12/04/11 0725 Last data filed at 12/04/11 1610  Gross per 24 hour  Intake   1120 ml  Output    925 ml  Net    195 ml      Physical Exam: General: Vital signs reviewed and noted. Well-developed, well-nourished, in no acute distress; alert, appropriate and cooperative throughout examination.  Lungs:  Normal respiratory effort. Clear to auscultation BL without crackles or wheezes.  Heart: RRR. S1 and S2 normal. (+) systolic murmur.  Abdomen:  BS normoactive. Soft, Nondistended, mild pubic tenderness.  No masses or organomegaly.  Extremities: No pretibial edema.     Labs:  Basic Metabolic Panel:  Lab 12/02/11 9604 11/30/11 0635 11/29/11 0615 11/28/11 0601  NA 135 135 135 132*  K 3.5 3.5 3.6 4.3  CL 100 97 95* 98  CO2 24 28 29 27   GLUCOSE 96 106* 148* 135*  BUN 10 10 14 14   CREATININE 0.69 0.72 0.81 0.75  CALCIUM 9.9 9.9 10.1 --  MG -- -- -- --  PHOS -- -- -- --    Liver Function Tests:  Lab 11/30/11 0635 11/29/11 0615  AST 52* 62*  ALT <5 <5  ALKPHOS 54 56  BILITOT 1.3* 1.4*  PROT 6.3 6.7  ALBUMIN 3.8 4.0    CBC:  Lab 12/02/11 0515 11/30/11 0635 11/29/11 0615 11/28/11 0601  WBC 5.4 5.9 6.6 9.0  NEUTROABS -- -- -- 7.3  HGB 8.8* 8.7* 8.7* 8.5*  HCT 28.5* 27.7* 28.5* 27.0*  MCV 88.5 91.1 92.5  90.9  PLT 223 213 208 191    Cardiac Enzymes:  Lab 11/28/11 2212 11/28/11 1652 11/28/11 1056 11/28/11 0602  CKTOTAL -- -- -- --  CKMB -- -- -- --  CKMBINDEX -- -- -- --  TROPONINI <0.30 <0.30 <0.30 <0.30    CBG:  Lab 12/01/11 0024 11/30/11 2038 11/30/11 1617 11/30/11 1218 11/30/11 0954  GLUCAP 114* 117* 119* 143* 111*    Microbiology: Results for orders placed during the hospital encounter of 11/28/11  CULTURE, BLOOD (ROUTINE X 2)     Status: Normal (Preliminary result)   Collection Time   11/29/11 10:30 AM      Component Value Range Status Comment   Specimen Description BLOOD LEFT FOREARM   Final    Special Requests BOTTLES DRAWN AEROBIC AND ANAEROBIC 10CC   Final    Culture  Setup Time 11/29/2011 14:45   Final    Culture     Final    Value:        BLOOD CULTURE RECEIVED NO GROWTH TO DATE CULTURE WILL BE HELD FOR 5 DAYS BEFORE ISSUING A FINAL NEGATIVE REPORT   Report Status PENDING   Incomplete   CULTURE, BLOOD (ROUTINE X 2)  Status: Normal (Preliminary result)   Collection Time   11/29/11 10:32 AM      Component Value Range Status Comment   Specimen Description BLOOD RIGHT WRIST   Final    Special Requests BOTTLES DRAWN AEROBIC ONLY 10CC   Final    Culture  Setup Time 11/29/2011 14:46   Final    Culture     Final    Value:        BLOOD CULTURE RECEIVED NO GROWTH TO DATE CULTURE WILL BE HELD FOR 5 DAYS BEFORE ISSUING A FINAL NEGATIVE REPORT   Report Status PENDING   Incomplete     Coagulation Studies: No results found for this basename: LABPROT:5,INR:5 in the last 72 hours   Imaging: No results found.     Medications:    Infusions:    Scheduled Medications:    . aspirin EC  81 mg Oral Daily  . carbidopa-levodopa  2 tablet Oral Q breakfast  . chlorhexidine  15 mL Mouth/Throat BID  . cloNIDine  0.1 mg Oral BID  . docusate sodium  100 mg Oral BID  . enoxaparin (LOVENOX) injection  40 mg Subcutaneous Q24H  . ferrous gluconate  324 mg Oral BID WC  .  finasteride  5 mg Oral Daily  . furosemide  20 mg Oral Daily  . gabapentin  300 mg Oral BID  . loratadine  10 mg Oral Daily  . naproxen  500 mg Oral Once  . omega-3 acid ethyl esters  1 g Oral BID  . pantoprazole  40 mg Oral Daily  . sodium chloride  3 mL Intravenous Q12H  . zolpidem  5 mg Oral QHS    PRN Medications: alum & mag hydroxide-simeth, ondansetron (ZOFRAN) IV, ondansetron   Assessment/ Plan:    1.   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 - Appreciate CVTS assistance and input in the management of our patient.  - stopped metoprolol - surgery on Tuesday  2.   Chronic daily headache:  This too is likely multifactorial.  Parts of his history are consistent with morning headaches secondary to sleep apnea.  He has documented sleep apnea, but has refused to consistently use CPAP at night.  This does not, however, explain all of his headaches.  His history is consistent with migraine headaches and rebound headaches.  He may have a long history of tension headaches, for which he has self medicated with over the counter analgesia, leading now to rebound headaches; indeed he reports frequent acetaminophen use over the past few weeks.  Alternatively, he may be experiencing migraine headaches.  Photophobia and unilateral pain support this.  His history is not consistent with cluster headaches.  Perhaps clonidine and relative hypotension is contributing as well.  We have stopped APAP and oxycodone.  His headaches are decreasing in intensity.  This makes me think they were rebound in nature. - avoid acetaminophen - Continue PRN NSAID for now, monitoring renal function.  3.   Iron deficiency anemia:  Patient is followed by Dr. Leone Payor for a  gastrointestinal tract bleed that Dr. Leone Payor has been unable to identify.  The patient has previously refused capsule endoscopy.  In September, iron panel was consistent with iron deficiency.  An infusion of ferumoxytol was administered by Dr. Leone Payor, but the patient experienced lower extremity  edema.  I have talked with hematology and they suggest any of the other iron preperations, either iron dextran or iron sucrose.  Alternatively, he may need a blood transfusion.  We will wait to hear from cardiothoracic surgery before treating the anemia intravenously.  He does take 324mg  of ferrous gluconate daily at home.  Will start this BID.  Hemoglobin is stable. - consider IV iron dextran, iron sucrose, or blood tranfusion - oral ferrous gluconate 324mg  BID   4.   Elevated proBNP:  This may represent congestive heart failure secondary to critical aortic stenosis.  Echocardiography demonstrated normal systolic function but critical aortic stenosis and grade I diastolic dysfunction.  Chest roentgenogram was also consistent with pulmonary edema and he had trace pedal edema.  According to his records, he experienced some edema and depression from oral furosemide that was prescribed around 2 weeks prior to admission as he only takes it PRN at home.  He responded well to IV furosemide on 10/21, so we have restarted him on daily furosemide 20mg .   - continue furosemide 20mg  daily for now. - Will check orthostatics, if (+) consider to dc lasix for now.  5.   Hypertension:  Blood pressure is under control currently, mildly low.  At home, he takes clonidine 0.2mg  BID, HCTZ 12.5mg  daily, losartan 100mg  daily, and metoprolol 100mg  BID.  He was also recently started on furosemide 20mg  daily for peripheral edema.  We have stopped his metoprolol as was bradycardic and symptomatic with syncope.  We have also held his HCTZ and losartan.  We have restarted his furosemide at 20mg  daily, and we have decreased the clonidine dose to  0.1mg  BID.  Blood pressures well controlled. - continue clonidine 0.1mg  BID - hold HCTZ and losartan - stopped metoprolol - Check orthostatics.  6.   Diabetes:  Glucose has been well controlled here with minimal insulin.  Last hemoglobin A1c is 7, it is currently 4.4 as a result of his anemia.  We will stop correctional insulin and CBG checks.  Fasting blood glucose this morning was 96. - Will resume blood sugar checks, especially surrounding his surgery.  7.   Dyslipidemia:  At home, he takes fish oil.  Fasting lipid panel is normal with an LDL of 50. - continue fish oil  8.   Hyponatremia:  Was low at 132 at admission and he has run low in the past around 131 to 132.  After restarting furosemide, it has increased and remained stable at 135.  9.   Restless leg syndrome:  Patient says the clonidine was prescribed for hypertension not restless leg.  The medicines he takes for restless leg are carbidopa-levodopa and gabapentin.  For some time now, he has been taking 2 tablets of carbidopa-levodopa with breakfast rather than the BID dosing we have been using here.  He says the once a day dosing works better. - continue carbidopa-levodopa 2 tablets with breakfast - continue gabapentin BID  10. Insomnia:  Anxiety is likely playing a big part in this.  His headaches are also probably contributing to this and maybe caused by this.  He did not respond well to trazodone last night, but he says he has used Ambien before with good effect. - continue zolpidem 5mg  qHS  11. Dysuria:  Exam of genitalia is normal and urinalysis from October 22 is entirely normal, negative for hemoglobin, leukocyte esterase, and nitrite.  He is established with a urologist and plans to follow up with him once he is squared  away with his heart.    12. Constipation:  Patient is having regular bowel movements but the stool is hard and requires straining to pass.  He is currently on senna-docusate BID which has 50mg  of docusate  per tablet.  We will switch to just docusate but 100mg  BID. - Change to Senna-S and add miralax.  13. Post-nasal drainage:  No purulance or evidence of viral or bacterial infection.  Likely allergic rhinosinusitis.  Improving with loratadine. - loratadine 10mg  daily  14. Prophylaxis: - enoxaparin for VTE prophylaxis  15. Disposition: surgery Tuesday   Length of Stay: 6 days   Signed: Johnette Abraham, Roma Schanz, Internal Medicine Resident Pager: [phone] (7AM-5PM) 12/04/2011, 7:25 AM

## 2011-12-04 NOTE — Progress Notes (Signed)
Client asleep in chair and stated, " awakened by ringing in ears and dizziness.  Called for nurse at 1440 when this occurred.   No call out noted.  Came to check on client he is anxious, although, he is cooperative.  Fidgety in bed, rubbing head constantly.  Checked monitor, no change in rhythm noted.  Vital signs unchanged.    Thanks, [employer]

## 2011-12-05 DIAGNOSIS — R55 Syncope and collapse: Secondary | ICD-10-CM | POA: Diagnosis not present

## 2011-12-05 DIAGNOSIS — I359 Nonrheumatic aortic valve disorder, unspecified: Secondary | ICD-10-CM

## 2011-12-05 LAB — CULTURE, BLOOD (ROUTINE X 2): Culture: NO GROWTH

## 2011-12-05 LAB — CBC
HCT: 33.3 % — ABNORMAL LOW (ref 39.0–52.0)
MCHC: 30.9 g/dL (ref 30.0–36.0)
MCV: 87.2 fL (ref 78.0–100.0)
RDW: 17.9 % — ABNORMAL HIGH (ref 11.5–15.5)

## 2011-12-05 LAB — BASIC METABOLIC PANEL
BUN: 12 mg/dL (ref 6–23)
CO2: 27 mEq/L (ref 19–32)
Calcium: 10.6 mg/dL — ABNORMAL HIGH (ref 8.4–10.5)
Chloride: 98 mEq/L (ref 96–112)
Creatinine, Ser: 0.71 mg/dL (ref 0.50–1.35)
GFR calc Af Amer: >90 mL/min (ref >90)

## 2011-12-05 MED ORDER — MAGNESIUM SULFATE 50 % IJ SOLN
40.0000 meq | INTRAMUSCULAR | Status: DC
  Filled 2011-12-05: qty 10

## 2011-12-05 MED ORDER — CHLORHEXIDINE GLUCONATE 4 % EX LIQD
60.0000 mL | Freq: Once | CUTANEOUS | Status: AC
  Administered 2011-12-06: 4 via TOPICAL
  Filled 2011-12-05: qty 60

## 2011-12-05 MED ORDER — POTASSIUM CHLORIDE 2 MEQ/ML IV SOLN
80.0000 meq | INTRAVENOUS | Status: DC
  Filled 2011-12-05: qty 40

## 2011-12-05 MED ORDER — EPINEPHRINE HCL 1 MG/ML IJ SOLN
0.5000 ug/min | INTRAVENOUS | Status: DC
  Filled 2011-12-05: qty 4

## 2011-12-05 MED ORDER — DOPAMINE-DEXTROSE 3.2-5 MG/ML-% IV SOLN
2.0000 ug/kg/min | INTRAVENOUS | Status: DC
  Filled 2011-12-05: qty 250

## 2011-12-05 MED ORDER — TEMAZEPAM 15 MG PO CAPS: 15.0000 mg | ORAL_CAPSULE | Freq: Once | ORAL | Status: AC | PRN

## 2011-12-05 MED ORDER — DEXMEDETOMIDINE HCL IN NACL 400 MCG/100ML IV SOLN
0.1000 ug/kg/h | INTRAVENOUS | Status: AC
  Administered 2011-12-06: 0.2 ug/kg/h via INTRAVENOUS
  Filled 2011-12-05: qty 100

## 2011-12-05 MED ORDER — METOPROLOL TARTRATE 12.5 MG HALF TABLET
12.5000 mg | ORAL_TABLET | Freq: Once | ORAL | Status: AC
  Administered 2011-12-06: 12.5 mg via ORAL
  Filled 2011-12-05: qty 1

## 2011-12-05 MED ORDER — DEXTROSE 5 % IV SOLN
750.0000 mg | INTRAVENOUS | Status: DC
  Filled 2011-12-05: qty 750

## 2011-12-05 MED ORDER — PHENYLEPHRINE HCL 10 MG/ML IJ SOLN
30.0000 ug/min | INTRAVENOUS | Status: AC
  Administered 2011-12-06: 20 ug/min via INTRAVENOUS
  Filled 2011-12-05: qty 2

## 2011-12-05 MED ORDER — NITROGLYCERIN IN D5W 200-5 MCG/ML-% IV SOLN
2.0000 ug/min | INTRAVENOUS | Status: AC
  Administered 2011-12-06: 5 ug/min via INTRAVENOUS
  Filled 2011-12-05: qty 250

## 2011-12-05 MED ORDER — VANCOMYCIN HCL 1000 MG IV SOLR
1250.0000 mg | INTRAVENOUS | Status: AC
  Administered 2011-12-06: 1250 mg via INTRAVENOUS
  Filled 2011-12-05: qty 1250

## 2011-12-05 MED ORDER — PLASMA-LYTE 148 IV SOLN
INTRAVENOUS | Status: AC
  Administered 2011-12-06: 08:00:00
  Filled 2011-12-05: qty 2.5

## 2011-12-05 MED ORDER — SODIUM CHLORIDE 0.9 % IV SOLN
INTRAVENOUS | Status: AC
  Administered 2011-12-06: 1.8 [IU]/h via INTRAVENOUS
  Filled 2011-12-05: qty 1

## 2011-12-05 MED ORDER — BISACODYL 5 MG PO TBEC
5.0000 mg | DELAYED_RELEASE_TABLET | Freq: Once | ORAL | Status: AC
  Administered 2011-12-05: 5 mg via ORAL
  Filled 2011-12-05: qty 1

## 2011-12-05 MED ORDER — SODIUM CHLORIDE 0.9 % IV SOLN
INTRAVENOUS | Status: AC
  Administered 2011-12-06: 14 mL/h via INTRAVENOUS
  Filled 2011-12-05: qty 40

## 2011-12-05 MED ORDER — DEXTROSE 5 % IV SOLN
1.5000 g | INTRAVENOUS | Status: AC
  Administered 2011-12-06: .75 g via INTRAVENOUS
  Administered 2011-12-06: 1.5 g via INTRAVENOUS
  Filled 2011-12-05: qty 1.5

## 2011-12-05 NOTE — Progress Notes (Signed)
Pt has refused cpap at this time. 

## 2011-12-05 NOTE — Progress Notes (Signed)
Internal Medicine Teaching Service Attending Note Date: 12/05/2011  Patient name: [name]  Medical record number: [id number]  Date of birth: 1945-06-19   I have seen and evaluated [name] and discussed their care with the Residency Team.   Without complaints  Physical Exam: Blood pressure 108/70, pulse 80, temperature 98.5 F (36.9 C), temperature source Oral, resp. rate 18, height 5\' 9"  (1.753 m), weight 81 kg (178 lb 9.2 oz), SpO2 98.00%. General appearance: alert, cooperative and no distress  Lab results: Results for orders placed during the hospital encounter of 11/28/11 (from the past 24 hour(s))  BASIC METABOLIC PANEL     Status: Abnormal   Collection Time   12/05/11 12:35 PM      Component Value Range   Sodium 135  135 - 145 mEq/L   Potassium 4.0  3.5 - 5.1 mEq/L   Chloride 98  96 - 112 mEq/L   CO2 27  19 - 32 mEq/L   Glucose, Bld 99  70 - 99 mg/dL   BUN 12  6 - 23 mg/dL   Creatinine, Ser 4.09  0.50 - 1.35 mg/dL   Calcium 81.1 (*) 8.4 - 10.5 mg/dL   GFR calc non Af Amer >90  >90 mL/min   GFR calc Af Amer >90  >90 mL/min  CBC     Status: Abnormal   Collection Time   12/05/11 12:35 PM      Component Value Range   WBC 5.0  4.0 - 10.5 K/uL   RBC 3.82 (*) 4.22 - 5.81 MIL/uL   Hemoglobin 10.3 (*) 13.0 - 17.0 g/dL   HCT 91.4 (*) 78.2 - 95.6 %   MCV 87.2  78.0 - 100.0 fL   MCH 27.0  26.0 - 34.0 pg   MCHC 30.9  30.0 - 36.0 g/dL   RDW 21.3 (*) 08.6 - 57.8 %   Platelets 251  150 - 400 K/uL    Imaging results:  No results found.  Assessment and Plan: I agree with the formulated Assessment and Plan with the following changes: Awaiting AVR in AM

## 2011-12-05 NOTE — Progress Notes (Signed)
Subjective:    Interval Events:  Had a headache this morning but has resolved without intervention.  No complaints.  No events.  Denies constipation.  Denies dizziness.    Objective:    Vital Signs:   Temp:  [98 F (36.7 C)-98.4 F (36.9 C)] 98.3 F (36.8 C) (10/28 0511) Pulse Rate:  [79-86] 82  (10/28 1028) Resp:  [16-20] 16  (10/28 1028) BP: (105-117)/(63-75) 109/75 mmHg (10/28 1028) SpO2:  [96 %-99 %] 96 % (10/28 1028) Weight:  [178 lb 9.2 oz (81 kg)] 178 lb 9.2 oz (81 kg) (10/28 0511) Last BM Date: 12/04/11   Weights: 24-hour Weight change: -1 lb 1.6 oz (-0.5 kg)  Filed Weights   12/03/11 0508 12/04/11 0500 12/05/11 0511  Weight: 181 lb 7 oz (82.3 kg) 179 lb 10.8 oz (81.5 kg) 178 lb 9.2 oz (81 kg)   Net since admission:  -5.8kg, steadily trending down   Intake/Output:   Intake/Output Summary (Last 24 hours) at 12/05/11 1147 Last data filed at 12/05/11 1145  Gross per 24 hour  Intake   1160 ml  Output   1575 ml  Net   -415 ml     Net since admission:  -4.3L   Physical Exam: GENERAL:  alert and oriented; resting comfortably in bed and in no distress LUNGS:  clear to auscultation bilaterally, normal work of breathing HEART:  normal rate; regular rhythm; 3/6 systolic murmur; no rubs, clicks, or gallops ABDOMEN:  soft, non-tender, normal bowel sounds, no masses palpated EXTREMITIES:  no edema SKIN:  normal turgor    Labs: Basic Metabolic Panel:  Pending  CBC:  pending  Microbiology: Results for orders placed during the hospital encounter of 11/28/11  CULTURE, BLOOD (ROUTINE X 2)     Status: Normal   Collection Time   11/29/11 10:30 AM      Component Value Range Status Comment   Specimen Description BLOOD LEFT FOREARM   Final    Special Requests BOTTLES DRAWN AEROBIC AND ANAEROBIC 10CC   Final    Culture  Setup Time 11/29/2011 14:45   Final    Culture NO GROWTH 5 DAYS   Final    Report Status 12/05/2011 FINAL   Final   CULTURE, BLOOD (ROUTINE X  2)     Status: Normal   Collection Time   11/29/11 10:32 AM      Component Value Range Status Comment   Specimen Description BLOOD RIGHT WRIST   Final    Special Requests BOTTLES DRAWN AEROBIC ONLY 10CC   Final    Culture  Setup Time 11/29/2011 14:46   Final    Culture NO GROWTH 5 DAYS   Final    Report Status 12/05/2011 FINAL   Final     Other results: Imaging: No results found.    Medications:    Infusions:     Scheduled Medications:    . aspirin EC  81 mg Oral Daily  . carbidopa-levodopa  2 tablet Oral Q breakfast  . chlorhexidine  15 mL Mouth/Throat BID  . cloNIDine  0.1 mg Oral BID  . ferrous gluconate  324 mg Oral BID WC  . finasteride  5 mg Oral Daily  . furosemide  20 mg Oral Daily  . gabapentin  300 mg Oral BID  . loratadine  10 mg Oral Daily  . omega-3 acid ethyl esters  1 g Oral BID  . pantoprazole  40 mg Oral Daily  . polyethylene glycol  17 g Oral  Daily  . senna-docusate  1 tablet Oral BID  . sodium chloride  3 mL Intravenous Q12H  . zolpidem  5 mg Oral QHS  . DISCONTD: enoxaparin (LOVENOX) injection  40 mg Subcutaneous Q24H     PRN Medications: alum & mag hydroxide-simeth, ondansetron (ZOFRAN) IV, ondansetron    Assessment/ Plan:     1.   Syncope:  Echocardiography demonstrated critical stenosis of the bovine aortic valve; heart catheterization findings consistent.  This, coupled with 100mg  of metoprolol BID and chronic anemia with hemoglobins in the 8s has resulted in syncope.  Arrhythmias are unlikely since these episodes he has been having have not been sudden and there is always warning signs.  Seizure activity is unlikely, as one would expect seizure-like activity and post-ictal confusion. Cardiothoracic surgery will replace the valve tomorrow. - stopped metoprolol - surgery   2.   Chronic daily headache:  Parts of his history are consistent with morning headaches secondary to sleep apnea.  He has documented sleep apnea, but has refused to  consistently use CPAP at night.  This does not, however, explain all of his headaches.  His history is consistent with migraine headaches and rebound headaches.  He may have a long history of tension headaches, for which he has self medicated with over the counter analgesia, leading now to rebound headaches; indeed he reports frequent acetaminophen use over the past few weeks.  Alternatively, he may be experiencing migraine headaches.  Photophobia and unilateral pain support this.  His history is not consistent with cluster headaches.  Perhaps clonidine and relative hypotension is contributing as well.  We have stopped APAP and oxycodone.  His headaches are decreasing in intensity.  This makes me think they were rebound in nature. - avoid acetaminophen  3.   Iron deficiency anemia:  Patient is followed by Dr. Leone Payor for a gastrointestinal tract bleed that Dr. Leone Payor has been unable to identify.  The patient has previously refused capsule endoscopy.  In September, iron panel was consistent with iron deficiency.  An infusion of ferumoxytol was administered by Dr. Leone Payor, but the patient experienced lower extremity edema.  I have talked with hematology and they suggest any of the other iron preperations, either iron dextran or iron sucrose.  Alternatively, he may need a blood transfusion.  We will wait to hear from cardiothoracic surgery before treating the anemia intravenously.  He does take 324mg  of ferrous gluconate daily at home.  Will start this BID.  Hemoglobin is stable. - oral ferrous gluconate 324mg  BID  - check CBC today  4.   Elevated proBNP:  This may represent congestive heart failure secondary to critical aortic stenosis.  Echocardiography demonstrated normal systolic function but critical aortic stenosis and grade I diastolic dysfunction.  Chest roentgenogram was also consistent with pulmonary edema and he had trace pedal edema.  According to his records, he experienced some edema and  depression from oral furosemide that was prescribed around 2 weeks prior to admission.  He responded well to IV furosemide on 10/21, so we have restarted him on daily furosemide 20mg .   - continue furosemide 20mg  daily  5.   Hypertension:  Blood pressure is under control currently.  At home, he takes clonidine 0.2mg  BID, HCTZ 12.5mg  daily, losartan 100mg  daily, and metoprolol 100mg  BID.  He was also recently started on furosemide 20mg  daily for peripheral edema.  We have stopped his metoprolol as was bradycardic and symptomatic with syncope.  We have also held his HCTZ and losartan.  We have restarted his furosemide at 20mg  daily, and we have decreased the clonidine dose to 0.1mg  BID.  Blood pressures well controlled.  Stopping clonidine all together is a possibility as his blood pressures are low normal now, but with an operation tomorrow, we will make no changes today. - continue clonidine 0.1mg  BID - hold HCTZ and losartan - stopped metoprolol  6.   Diabetes:  Glucose has been well controlled here with minimal insulin.  Last hemoglobin A1c is 7, it is currently 4.4 as a result of his anemia.  We will stop correctional insulin and CBG checks. - stop correctional insulin  7.   Dyslipidemia:  At home, he takes fish oil.  Fasting lipid panel is normal with an LDL of 50. - continue fish oil  8.   Hyponatremia:  Was low at 132 at admission and he has run low in the past around 131 to 132.  After restarting furosemide, it has increased and remained stable at 135. - check BMET today  9.   Restless leg syndrome:  Patient says the clonidine was prescribed for hypertension not restless leg.  The medicines he takes for restless leg are carbidopa-levodopa and gabapentin.  For some time now, he has been taking 2 tablets of carbidopa-levodopa with breakfast rather than the BID dosing we have been using here.  He says the once a day dosing works better. - continue carbidopa-levodopa 2 tablets with breakfast -  continue gabapentin BID  10. Insomnia:  Anxiety is likely playing a big part in this.  His headaches are also probably contributing to this and maybe caused by this.  He did not respond well to trazodone last night, but he says he has used Ambien before with good effect. - continue zolpidem 5mg  qHS  11. Dysuria:  Exam of genitalia is normal and urinalysis from October 22 is entirely normal, negative for hemoglobin, leukocyte esterase, and nitrite.  He is established with a urologist and plans to follow up with him once he is squared away with his heart.  I do not suspect a urinary tract infection.  12. Constipation:  Patient is having regular bowel movements but the stool is hard and requires straining to pass.  He was on senna-docusate BID which has 50mg  of docusate per tablet.  We switched to just docusate, 100mg  BID, but he now back on senna-docusate plus daily polyethylene glycol.  He has had a BM and denies current constipation. - senna-docusate BID  13. Post-nasal drainage:  No purulance or evidence of viral or bacterial infection.  Likely allergic rhinosinusitis.  Improving with loratadine. - continue loratadine 10mg  daily  14. Prophylaxis: - enoxaparin for VTE prophylaxis  15. Disposition: surgery Tuesday    Length of Stay: 7 days   Signed by:  Dorthula Rue. Earlene Plater, MD PGY-I, Internal Medicine Pager [phone]  12/05/2011, 11:47 AM

## 2011-12-05 NOTE — Progress Notes (Signed)
   CARDIOTHORACIC SURGERY PROGRESS NOTE  5 Days Post-Op  S/P Procedure(s) (LRB): LEFT HEART CATHETERIZATION WITH CORONARY ANGIOGRAM (N/A) RIGHT HEART CATH (Bilateral)  Subjective: No complaints  Objective: Vital signs in last 24 hours: Temp:  [98 F (36.7 C)-98.4 F (36.9 C)] 98.3 F (36.8 C) (10/28 0511) Pulse Rate:  [79-98] 79  (10/28 0511) Cardiac Rhythm:  [-] Normal sinus rhythm (10/27 2127) Resp:  [20] 20  (10/28 0511) BP: (95-117)/(63-79) 105/63 mmHg (10/28 0511) SpO2:  [96 %-99 %] 96 % (10/28 0511) Weight:  [81 kg (178 lb 9.2 oz)] 81 kg (178 lb 9.2 oz) (10/28 0511)  Physical Exam:  Rhythm:   sinus  Breath sounds: clear  Heart sounds:  RRR w/ prominent systolic murmur  Incisions:  n/a  Abdomen:  soft  Extremities:  warm   Intake/Output from previous day: 10/27 0701 - 10/28 0700 In: 1000 [P.O.:920; I.V.:80] Out: 775 [Urine:775] Intake/Output this shift:    Lab Results: No results found for this basename: WBC:2,HGB:2,HCT:2,PLT:2 in the last 72 hours BMET: No results found for this basename: NA:2,K:2,CL:2,CO2:2,GLUCOSE:2,BUN:2,CREATININE:2,CALCIUM:2 in the last 72 hours  CBG (last 3)  No results found for this basename: GLUCAP:3 in the last 72 hours PT/INR:  No results found for this basename: LABPROT,INR in the last 72 hours  CXR:  *RADIOLOGY REPORT*  Clinical Data: Preop for cardiac surgery  CHEST - 2 VIEW  Comparison: Portable chest x-ray of 11/28/2011  Findings: No active infiltrate or effusion is seen. There is mild  cardiomegaly present. An aortic valve replacement is noted.  IMPRESSION:  Stable mild cardiomegaly. No active lung disease.  Original Report Authenticated By: Juline Patch, M.D.    Assessment/Plan: S/P Procedure(s) (LRB): LEFT HEART CATHETERIZATION WITH CORONARY ANGIOGRAM (N/A) RIGHT HEART CATH (Bilateral)  I have again reviewed the indications, risks and potential benefits of surgery with Mr Hackbart and his wife.  Alternative  treatment strategies discussed.  We plan to proceed with redo aortic valve replacement or possible aortic root replacement using a bioprosthetic tissue valve in the OR tomorrow morning.  They understand and accept all potential associated risks of surgery including but not limited to risk of death, stroke, myocardial infarction, congestive heart failure, respiratory failure, renal failure, bleeding requiring blood transfusion and/or reexploration, arrhythmia, heart block or bradycardia requiring permanent pacemaker, pneumonia, pleural effusion, wound infection, pulmonary embolus or other thromboembolic complication, chronic pain or other delayed complications including the possibility of late structural valve deterioration and failure.  All questions answered.   OWEN,CLARENCE H 12/05/2011 8:51 AM

## 2011-12-06 ENCOUNTER — Encounter (HOSPITAL_COMMUNITY): Payer: Self-pay | Admitting: Thoracic Surgery (Cardiothoracic Vascular Surgery)

## 2011-12-06 ENCOUNTER — Inpatient Hospital Stay (HOSPITAL_COMMUNITY): Payer: Medicare Other

## 2011-12-06 ENCOUNTER — Encounter (HOSPITAL_COMMUNITY): Payer: Self-pay | Admitting: Anesthesiology

## 2011-12-06 ENCOUNTER — Inpatient Hospital Stay (HOSPITAL_COMMUNITY): Payer: Medicare Other | Admitting: Anesthesiology

## 2011-12-06 ENCOUNTER — Encounter (HOSPITAL_COMMUNITY)
Admission: EM | Disposition: A | Discharge status: Home / Self Care | Payer: Self-pay | Source: Home / Self Care | Attending: Infectious Diseases

## 2011-12-06 DIAGNOSIS — E119 Type 2 diabetes mellitus without complications: Secondary | ICD-10-CM | POA: Diagnosis not present

## 2011-12-06 DIAGNOSIS — Z953 Presence of xenogenic heart valve: Secondary | ICD-10-CM

## 2011-12-06 DIAGNOSIS — T82897A Other specified complication of cardiac prosthetic devices, implants and grafts, initial encounter: Secondary | ICD-10-CM | POA: Diagnosis not present

## 2011-12-06 DIAGNOSIS — N401 Enlarged prostate with lower urinary tract symptoms: Secondary | ICD-10-CM | POA: Diagnosis not present

## 2011-12-06 DIAGNOSIS — R55 Syncope and collapse: Secondary | ICD-10-CM | POA: Diagnosis not present

## 2011-12-06 DIAGNOSIS — I10 Essential (primary) hypertension: Secondary | ICD-10-CM | POA: Diagnosis not present

## 2011-12-06 DIAGNOSIS — I359 Nonrheumatic aortic valve disorder, unspecified: Secondary | ICD-10-CM

## 2011-12-06 DIAGNOSIS — Z09 Encounter for follow-up examination after completed treatment for conditions other than malignant neoplasm: Secondary | ICD-10-CM | POA: Diagnosis not present

## 2011-12-06 HISTORY — DX: Presence of xenogenic heart valve: Z95.3

## 2011-12-06 HISTORY — PX: AORTIC VALVE REPLACEMENT: SHX41

## 2011-12-06 LAB — POCT I-STAT GLUCOSE: Operator id: 361721

## 2011-12-06 LAB — POCT I-STAT 4, (NA,K, GLUC, HGB,HCT)
Glucose, Bld: 120 mg/dL — ABNORMAL HIGH (ref 70–99)
Glucose, Bld: 154 mg/dL — ABNORMAL HIGH (ref 70–99)
Glucose, Bld: 97 mg/dL (ref 70–99)
HCT: 26 % — ABNORMAL LOW (ref 39.0–52.0)
HCT: 26 % — ABNORMAL LOW (ref 39.0–52.0)
HCT: 32 % — ABNORMAL LOW (ref 39.0–52.0)
Hemoglobin: 9.9 g/dL — ABNORMAL LOW (ref 13.0–17.0)
Hemoglobin: 8.8 g/dL — ABNORMAL LOW (ref 13.0–17.0)
Hemoglobin: 8.8 g/dL — ABNORMAL LOW (ref 13.0–17.0)
Hemoglobin: 8.8 g/dL — ABNORMAL LOW (ref 13.0–17.0)
Hemoglobin: 8.8 g/dL — ABNORMAL LOW (ref 13.0–17.0)
Potassium: 3.9 mEq/L (ref 3.5–5.1)
Potassium: 4 mEq/L (ref 3.5–5.1)
Potassium: 4.2 mEq/L (ref 3.5–5.1)
Potassium: 4.2 mEq/L (ref 3.5–5.1)
Potassium: 3.7 mEq/L (ref 3.5–5.1)
Sodium: 135 mEq/L (ref 135–145)
Sodium: 133 mEq/L — ABNORMAL LOW (ref 135–145)
Sodium: 137 mEq/L (ref 135–145)

## 2011-12-06 LAB — GLUCOSE, CAPILLARY
Glucose-Capillary: 101 mg/dL — ABNORMAL HIGH (ref 70–99)
Glucose-Capillary: 90 mg/dL (ref 70–99)
Glucose-Capillary: 91 mg/dL (ref 70–99)
Glucose-Capillary: 109 mg/dL — ABNORMAL HIGH (ref 70–99)
Glucose-Capillary: 83 mg/dL (ref 70–99)

## 2011-12-06 LAB — POCT I-STAT 3, ART BLOOD GAS (G3+)
Acid-base deficit: 3 mmol/L — ABNORMAL HIGH (ref 0.0–2.0)
Acid-base deficit: 2 mmol/L (ref 0.0–2.0)
Bicarbonate: 25.1 mEq/L — ABNORMAL HIGH (ref 20.0–24.0)
Bicarbonate: 22.4 mEq/L (ref 20.0–24.0)
Bicarbonate: 23.1 mEq/L (ref 20.0–24.0)
O2 Saturation: 99 %
O2 Saturation: 95 %
Patient temperature: 36.7
Patient temperature: 37.5
Patient temperature: 37.7
TCO2: 26 mmol/L (ref 0–100)
TCO2: 24 mmol/L (ref 0–100)
pCO2 arterial: 41.3 mmHg (ref 35.0–45.0)
pCO2 arterial: 43.5 mmHg (ref 35.0–45.0)
pCO2 arterial: 39.8 mmHg (ref 35.0–45.0)
pH, Arterial: 7.391 (ref 7.350–7.450)
pH, Arterial: 7.329 — ABNORMAL LOW (ref 7.350–7.450)
pH, Arterial: 7.357 (ref 7.350–7.450)
pO2, Arterial: 145 mmHg — ABNORMAL HIGH (ref 80.0–100.0)
pO2, Arterial: 62 mmHg — ABNORMAL LOW (ref 80.0–100.0)

## 2011-12-06 LAB — POCT I-STAT, CHEM 8
BUN: 10 mg/dL (ref 6–23)
Calcium, Ion: 1.24 mmol/L (ref 1.13–1.30)
Chloride: 105 mEq/L (ref 96–112)
Glucose, Bld: 116 mg/dL — ABNORMAL HIGH (ref 70–99)
TCO2: 21 mmol/L (ref 0–100)

## 2011-12-06 LAB — CBC
HCT: 30.9 % — ABNORMAL LOW (ref 39.0–52.0)
HCT: 31 % — ABNORMAL LOW (ref 39.0–52.0)
Hemoglobin: 10.1 g/dL — ABNORMAL LOW (ref 13.0–17.0)
MCH: 27.2 pg (ref 26.0–34.0)
MCHC: 31.4 g/dL (ref 30.0–36.0)
MCV: 86.6 fL (ref 78.0–100.0)
MCV: 86.4 fL (ref 78.0–100.0)
Platelets: 220 10*3/uL (ref 150–400)
RDW: 17.9 % — ABNORMAL HIGH (ref 11.5–15.5)
RDW: 17.3 % — ABNORMAL HIGH (ref 11.5–15.5)
WBC: 4.9 10*3/uL (ref 4.0–10.5)
WBC: 14.1 10*3/uL — ABNORMAL HIGH (ref 4.0–10.5)

## 2011-12-06 LAB — CREATININE, SERUM
Creatinine, Ser: 0.63 mg/dL (ref 0.50–1.35)
GFR calc non Af Amer: >90 mL/min (ref >90)

## 2011-12-06 LAB — BASIC METABOLIC PANEL
BUN: 15 mg/dL (ref 6–23)
Calcium: 10.2 mg/dL (ref 8.4–10.5)
Creatinine, Ser: 0.82 mg/dL (ref 0.50–1.35)
GFR calc Af Amer: >90 mL/min (ref >90)
GFR calc non Af Amer: >90 mL/min (ref >90)

## 2011-12-06 LAB — CBC WITH DIFFERENTIAL/PLATELET
Hemoglobin: 8 g/dL — ABNORMAL LOW (ref 13.0–17.0)
Lymphocytes Relative: 1 % — ABNORMAL LOW (ref 12–46)
Lymphs Abs: 0.1 10*3/uL — ABNORMAL LOW (ref 0.7–4.0)
MCH: 27.5 pg (ref 26.0–34.0)
Monocytes Relative: 2 % — ABNORMAL LOW (ref 3–12)
Neutro Abs: 9.9 10*3/uL — ABNORMAL HIGH (ref 1.7–7.7)
Neutrophils Relative %: 96 % — ABNORMAL HIGH (ref 43–77)
RBC: 2.91 MIL/uL — ABNORMAL LOW (ref 4.22–5.81)
WBC: 10.2 10*3/uL (ref 4.0–10.5)

## 2011-12-06 LAB — MAGNESIUM: Magnesium: 2.9 mg/dL — ABNORMAL HIGH (ref 1.5–2.5)

## 2011-12-06 LAB — SURGICAL PCR SCREEN: MRSA, PCR: NEGATIVE

## 2011-12-06 LAB — APTT: aPTT: 36 seconds (ref 24–37)

## 2011-12-06 SURGERY — REDO AORTIC VALVE REPLACEMENT (AVR)
Anesthesia: General | Site: Chest | Wound class: Clean

## 2011-12-06 MED ORDER — INSULIN REGULAR HUMAN 100 UNIT/ML IJ SOLN
INTRAMUSCULAR | Status: DC
  Filled 2011-12-06: qty 1

## 2011-12-06 MED ORDER — PHENYLEPHRINE HCL 10 MG/ML IJ SOLN
0.0000 ug/min | INTRAVENOUS | Status: DC
  Filled 2011-12-06: qty 2

## 2011-12-06 MED ORDER — VECURONIUM BROMIDE 10 MG IV SOLR
INTRAVENOUS | Status: DC | PRN
  Administered 2011-12-06 (×6): 5 mg via INTRAVENOUS

## 2011-12-06 MED ORDER — FAMOTIDINE IN NACL 20-0.9 MG/50ML-% IV SOLN
20.0000 mg | Freq: Two times a day (BID) | INTRAVENOUS | Status: DC
  Administered 2011-12-06: 20 mg via INTRAVENOUS

## 2011-12-06 MED ORDER — ACETAMINOPHEN 500 MG PO TABS
1000.0000 mg | ORAL_TABLET | Freq: Four times a day (QID) | ORAL | Status: AC
  Administered 2011-12-06 – 2011-12-11 (×18): 1000 mg via ORAL
  Filled 2011-12-06 (×20): qty 2

## 2011-12-06 MED ORDER — PANTOPRAZOLE SODIUM 40 MG PO TBEC
40.0000 mg | DELAYED_RELEASE_TABLET | Freq: Every day | ORAL | Status: DC
  Administered 2011-12-08 – 2011-12-13 (×6): 40 mg via ORAL
  Filled 2011-12-06 (×6): qty 1

## 2011-12-06 MED ORDER — BISACODYL 10 MG RE SUPP
10.0000 mg | Freq: Every day | RECTAL | Status: DC
  Filled 2011-12-06 (×2): qty 1

## 2011-12-06 MED ORDER — SODIUM CHLORIDE 0.9 % IJ SOLN
3.0000 mL | Freq: Two times a day (BID) | INTRAMUSCULAR | Status: DC
  Administered 2011-12-07 (×2): 3 mL via INTRAVENOUS

## 2011-12-06 MED ORDER — PHENYLEPHRINE HCL 10 MG/ML IJ SOLN
20.0000 mg | INTRAVENOUS | Status: DC | PRN
  Administered 2011-12-06: 20 ug/min via INTRAVENOUS

## 2011-12-06 MED ORDER — PROPOFOL 10 MG/ML IV BOLUS
INTRAVENOUS | Status: DC | PRN
  Administered 2011-12-06: 80 mg via INTRAVENOUS

## 2011-12-06 MED ORDER — SODIUM CHLORIDE 0.9 % IV SOLN
INTRAVENOUS | Status: DC | PRN
  Administered 2011-12-06: 14:00:00 via INTRAVENOUS

## 2011-12-06 MED ORDER — MAGNESIUM SULFATE 40 MG/ML IJ SOLN
4.0000 g | Freq: Once | INTRAMUSCULAR | Status: AC
  Administered 2011-12-06: 4 g via INTRAVENOUS
  Filled 2011-12-06: qty 100

## 2011-12-06 MED ORDER — LACTATED RINGERS IV SOLN
INTRAVENOUS | Status: DC | PRN
  Administered 2011-12-06 (×2): via INTRAVENOUS

## 2011-12-06 MED ORDER — SODIUM CHLORIDE 0.9 % IV SOLN
250.0000 mL | INTRAVENOUS | Status: DC
  Administered 2011-12-07: 250 mL via INTRAVENOUS

## 2011-12-06 MED ORDER — POTASSIUM CHLORIDE 10 MEQ/50ML IV SOLN
10.0000 meq | INTRAVENOUS | Status: AC
  Administered 2011-12-06 (×3): 10 meq via INTRAVENOUS

## 2011-12-06 MED ORDER — OXYCODONE HCL 5 MG PO TABS
5.0000 mg | ORAL_TABLET | ORAL | Status: DC | PRN
  Administered 2011-12-07 – 2011-12-09 (×9): 10 mg via ORAL
  Administered 2011-12-10: 5 mg via ORAL
  Administered 2011-12-10 – 2011-12-13 (×5): 10 mg via ORAL
  Filled 2011-12-06 (×15): qty 2

## 2011-12-06 MED ORDER — ALBUMIN HUMAN 5 % IV SOLN: 250.0000 mL | INTRAVENOUS | Status: AC | PRN

## 2011-12-06 MED ORDER — ASPIRIN EC 325 MG PO TBEC
325.0000 mg | DELAYED_RELEASE_TABLET | Freq: Every day | ORAL | Status: DC
  Administered 2011-12-07 – 2011-12-13 (×7): 325 mg via ORAL
  Filled 2011-12-06 (×7): qty 1

## 2011-12-06 MED ORDER — SODIUM CHLORIDE 0.9 % IJ SOLN
OROMUCOSAL | Status: DC | PRN
  Administered 2011-12-06: 08:00:00 via TOPICAL

## 2011-12-06 MED ORDER — MORPHINE SULFATE 2 MG/ML IJ SOLN
2.0000 mg | INTRAMUSCULAR | Status: DC | PRN
  Administered 2011-12-06: 4 mg via INTRAVENOUS
  Administered 2011-12-06 (×2): 2 mg via INTRAVENOUS
  Administered 2011-12-07 (×4): 4 mg via INTRAVENOUS
  Filled 2011-12-06: qty 2
  Filled 2011-12-06: qty 1
  Filled 2011-12-06 (×2): qty 2
  Filled 2011-12-06: qty 1
  Filled 2011-12-06 (×2): qty 2

## 2011-12-06 MED ORDER — ACETAMINOPHEN 160 MG/5ML PO SOLN: 975.0000 mg | Freq: Four times a day (QID) | ORAL | Status: DC

## 2011-12-06 MED ORDER — DEXTROSE 5 % IV SOLN
1.5000 g | Freq: Two times a day (BID) | INTRAVENOUS | Status: DC
  Administered 2011-12-06 – 2011-12-07 (×3): 1.5 g via INTRAVENOUS
  Filled 2011-12-06 (×4): qty 1.5

## 2011-12-06 MED ORDER — ALBUMIN HUMAN 5 % IV SOLN
INTRAVENOUS | Status: DC | PRN
  Administered 2011-12-06 (×2): via INTRAVENOUS

## 2011-12-06 MED ORDER — CALCIUM CHLORIDE 10 % IV SOLN
1.0000 g | Freq: Once | INTRAVENOUS | Status: AC | PRN
  Filled 2011-12-06: qty 10

## 2011-12-06 MED ORDER — ARTIFICIAL TEARS OP OINT
TOPICAL_OINTMENT | OPHTHALMIC | Status: DC | PRN
  Administered 2011-12-06: 1 via OPHTHALMIC

## 2011-12-06 MED ORDER — SODIUM CHLORIDE 0.9 % IJ SOLN: 3.0000 mL | INTRAMUSCULAR | Status: DC | PRN

## 2011-12-06 MED ORDER — SODIUM CHLORIDE 0.9 % IR SOLN
Status: DC | PRN
  Administered 2011-12-06: 3000 mL

## 2011-12-06 MED ORDER — MIDAZOLAM HCL 2 MG/2ML IJ SOLN
2.0000 mg | INTRAMUSCULAR | Status: DC | PRN
  Filled 2011-12-06: qty 2

## 2011-12-06 MED ORDER — PHENYLEPHRINE HCL 10 MG/ML IJ SOLN
INTRAMUSCULAR | Status: DC | PRN
  Administered 2011-12-06 (×3): 40 ug via INTRAVENOUS

## 2011-12-06 MED ORDER — ACETAMINOPHEN 10 MG/ML IV SOLN
1000.0000 mg | Freq: Once | INTRAVENOUS | Status: AC
  Administered 2011-12-06: 1000 mg via INTRAVENOUS
  Filled 2011-12-06: qty 100

## 2011-12-06 MED ORDER — INSULIN REGULAR BOLUS VIA INFUSION
0.0000 [IU] | Freq: Three times a day (TID) | INTRAVENOUS | Status: DC
  Filled 2011-12-06: qty 10

## 2011-12-06 MED ORDER — BISACODYL 5 MG PO TBEC
10.0000 mg | DELAYED_RELEASE_TABLET | Freq: Every day | ORAL | Status: DC
  Administered 2011-12-08 – 2011-12-13 (×4): 10 mg via ORAL
  Filled 2011-12-06 (×5): qty 2

## 2011-12-06 MED ORDER — NITROGLYCERIN IN D5W 200-5 MCG/ML-% IV SOLN: 0.0000 ug/min | INTRAVENOUS | Status: DC

## 2011-12-06 MED ORDER — SODIUM CHLORIDE 0.9 % IV SOLN
INTRAVENOUS | Status: DC
  Administered 2011-12-06: 20 mL/h via INTRAVENOUS

## 2011-12-06 MED ORDER — METOPROLOL TARTRATE 1 MG/ML IV SOLN
2.5000 mg | INTRAVENOUS | Status: DC | PRN
  Administered 2011-12-07: 5 mg via INTRAVENOUS

## 2011-12-06 MED ORDER — SODIUM CHLORIDE 0.45 % IV SOLN
INTRAVENOUS | Status: DC
  Administered 2011-12-06: 20 mL/h via INTRAVENOUS

## 2011-12-06 MED ORDER — AMINOCAPROIC ACID 250 MG/ML IV SOLN
INTRAVENOUS | Status: DC | PRN
  Administered 2011-12-06: 5 g via INTRAVENOUS

## 2011-12-06 MED ORDER — ASPIRIN 81 MG PO CHEW: 324.0000 mg | CHEWABLE_TABLET | Freq: Every day | ORAL | Status: DC

## 2011-12-06 MED ORDER — PROTAMINE SULFATE 10 MG/ML IV SOLN
INTRAVENOUS | Status: DC | PRN
  Administered 2011-12-06: 50 mg via INTRAVENOUS
  Administered 2011-12-06: 190 mg via INTRAVENOUS
  Administered 2011-12-06: 50 mg via INTRAVENOUS
  Administered 2011-12-06: 10 mg via INTRAVENOUS

## 2011-12-06 MED ORDER — MIDAZOLAM HCL 5 MG/5ML IJ SOLN
INTRAMUSCULAR | Status: DC | PRN
  Administered 2011-12-06 (×5): 2 mg via INTRAVENOUS
  Administered 2011-12-06: 1 mg via INTRAVENOUS
  Administered 2011-12-06: 3 mg via INTRAVENOUS
  Administered 2011-12-06 (×2): 1 mg via INTRAVENOUS

## 2011-12-06 MED ORDER — CALCIUM CHLORIDE 10 % IV SOLN
INTRAVENOUS | Status: DC | PRN
  Administered 2011-12-06: 0.5 g via INTRAVENOUS

## 2011-12-06 MED ORDER — LACTATED RINGERS IV SOLN
INTRAVENOUS | Status: DC | PRN
  Administered 2011-12-06 (×2): via INTRAVENOUS

## 2011-12-06 MED ORDER — DEXMEDETOMIDINE HCL IN NACL 200 MCG/50ML IV SOLN
0.1000 ug/kg/h | INTRAVENOUS | Status: DC
  Administered 2011-12-06: 0.4 ug/kg/h via INTRAVENOUS
  Filled 2011-12-06: qty 50

## 2011-12-06 MED ORDER — HEPARIN SODIUM (PORCINE) 1000 UNIT/ML IJ SOLN
INTRAMUSCULAR | Status: DC | PRN
  Administered 2011-12-06: 36000 [IU] via INTRAVENOUS

## 2011-12-06 MED ORDER — VANCOMYCIN HCL IN DEXTROSE 1-5 GM/200ML-% IV SOLN
1000.0000 mg | Freq: Once | INTRAVENOUS | Status: AC
  Administered 2011-12-06: 1000 mg via INTRAVENOUS
  Filled 2011-12-06: qty 200

## 2011-12-06 MED ORDER — ROCURONIUM BROMIDE 100 MG/10ML IV SOLN
INTRAVENOUS | Status: DC | PRN
  Administered 2011-12-06: 50 mg via INTRAVENOUS

## 2011-12-06 MED ORDER — SODIUM CHLORIDE 0.9 % IR SOLN
Status: DC | PRN
  Administered 2011-12-06: 1000 mL

## 2011-12-06 MED ORDER — HEMOSTATIC AGENTS (NO CHARGE) OPTIME
TOPICAL | Status: DC | PRN
  Administered 2011-12-06: 1 via TOPICAL

## 2011-12-06 MED ORDER — LACTATED RINGERS IV SOLN
INTRAVENOUS | Status: DC
  Administered 2011-12-06: 20 mL/h via INTRAVENOUS

## 2011-12-06 MED ORDER — METOPROLOL TARTRATE 25 MG/10 ML ORAL SUSPENSION
12.5000 mg | Freq: Two times a day (BID) | ORAL | Status: DC
  Filled 2011-12-06 (×3): qty 5

## 2011-12-06 MED ORDER — MORPHINE SULFATE 2 MG/ML IJ SOLN
1.0000 mg | INTRAMUSCULAR | Status: AC | PRN
  Administered 2011-12-06: 2 mg via INTRAVENOUS
  Filled 2011-12-06: qty 1

## 2011-12-06 MED ORDER — ONDANSETRON HCL 4 MG/2ML IJ SOLN
4.0000 mg | Freq: Four times a day (QID) | INTRAMUSCULAR | Status: DC | PRN
  Administered 2011-12-08: 4 mg via INTRAVENOUS
  Filled 2011-12-06 (×2): qty 2

## 2011-12-06 MED ORDER — LACTATED RINGERS IV SOLN
INTRAVENOUS | Status: DC | PRN
  Administered 2011-12-06: 07:00:00 via INTRAVENOUS

## 2011-12-06 MED ORDER — FENTANYL CITRATE 0.05 MG/ML IJ SOLN
INTRAMUSCULAR | Status: DC | PRN
  Administered 2011-12-06: 500 ug via INTRAVENOUS
  Administered 2011-12-06 (×2): 100 ug via INTRAVENOUS
  Administered 2011-12-06: 50 ug via INTRAVENOUS
  Administered 2011-12-06: 100 ug via INTRAVENOUS
  Administered 2011-12-06: 200 ug via INTRAVENOUS
  Administered 2011-12-06: 50 ug via INTRAVENOUS
  Administered 2011-12-06: 150 ug via INTRAVENOUS

## 2011-12-06 MED ORDER — DOCUSATE SODIUM 100 MG PO CAPS
200.0000 mg | ORAL_CAPSULE | Freq: Every day | ORAL | Status: DC
  Administered 2011-12-07 – 2011-12-13 (×6): 200 mg via ORAL
  Filled 2011-12-06 (×6): qty 2

## 2011-12-06 MED ORDER — ARTIFICIAL TEARS OP OINT: TOPICAL_OINTMENT | OPHTHALMIC | Status: DC | PRN

## 2011-12-06 MED ORDER — METOPROLOL TARTRATE 12.5 MG HALF TABLET
12.5000 mg | ORAL_TABLET | Freq: Two times a day (BID) | ORAL | Status: DC
  Administered 2011-12-06: 12.5 mg via ORAL
  Filled 2011-12-06 (×3): qty 1

## 2011-12-06 SURGICAL SUPPLY — 133 items
ADAPTER CARDIO PERF ANTE/RETRO (ADAPTER) ×2 IMPLANT
ADH SRG 12 PREFL SYR 3 SPRDR (MISCELLANEOUS)
ADPR PRFSN 84XANTGRD RTRGD (ADAPTER) ×1
APPLICATOR TIP BIOGLUE STANDRD (MISCELLANEOUS) IMPLANT
ATTRACTOMAT 16X20 MAGNETIC DRP (DRAPES) ×2 IMPLANT
BAG DECANTER FOR FLEXI CONT (MISCELLANEOUS) ×2 IMPLANT
BANDAGE ELASTIC 4 VELCRO ST LF (GAUZE/BANDAGES/DRESSINGS) ×1 IMPLANT
BANDAGE ELASTIC 6 VELCRO ST LF (GAUZE/BANDAGES/DRESSINGS) ×1 IMPLANT
BANDAGE GAUZE ELAST BULKY 4 IN (GAUZE/BANDAGES/DRESSINGS) ×1 IMPLANT
BASKET HEART (ORDER IN 25'S) (MISCELLANEOUS)
BASKET HEART (ORDER IN 25S) (MISCELLANEOUS) ×1 IMPLANT
BLADE CORE FAN STRYKER (BLADE) ×3 IMPLANT
BLADE OSCILLATING /SAGITTAL (BLADE) ×2 IMPLANT
BLADE SAW SAG 29X58X.64 (BLADE) IMPLANT
BLADE STERNUM SYSTEM 6 (BLADE) ×2 IMPLANT
BLADE SURG 11 STRL SS (BLADE) ×4 IMPLANT
BLADE SURG 15 STRL LF DISP TIS (BLADE) ×1 IMPLANT
BLADE SURG 15 STRL SS (BLADE) ×2
CANISTER SUCTION 2500CC (MISCELLANEOUS) ×2 IMPLANT
CANNULA AORTIC ROOT 20012 (MISCELLANEOUS) IMPLANT
CANNULA FEMORAL ART 14 SM (MISCELLANEOUS) ×1 IMPLANT
CANNULA GUNDRY RCSP 15FR (MISCELLANEOUS) ×2 IMPLANT
CARDIOBLATE CARDIAC ABLATION (MISCELLANEOUS)
CATH CPB KIT OWEN (MISCELLANEOUS) ×2 IMPLANT
CATH ENDOVENT PULMONARY (CATHETERS) ×1 IMPLANT
CATH HEART VENT LEFT (CATHETERS) ×1 IMPLANT
CATH ROBINSON RED A/P 18FR (CATHETERS) ×6 IMPLANT
CATH THORACIC 36FR (CATHETERS) ×2 IMPLANT
CATH THORACIC 36FR RT ANG (CATHETERS) ×1 IMPLANT
CAUTERY EYE LOW TEMP 1300F FIN (OPHTHALMIC RELATED) ×1 IMPLANT
CLIP FOGARTY SPRING 6M (CLIP) IMPLANT
CLOTH BEACON ORANGE TIMEOUT ST (SAFETY) ×2 IMPLANT
CONN 1/2X1/2X1/2  BEN (MISCELLANEOUS) ×1
CONN 1/2X1/2X1/2 BEN (MISCELLANEOUS) ×1 IMPLANT
CONN 3/8X1/2 ST GISH (MISCELLANEOUS) ×4 IMPLANT
CONN ST 1/4X3/8  BEN (MISCELLANEOUS) ×3
CONN ST 1/4X3/8 BEN (MISCELLANEOUS) IMPLANT
CONN Y 3/8X3/8X3/8  BEN (MISCELLANEOUS)
CONN Y 3/8X3/8X3/8 BEN (MISCELLANEOUS) IMPLANT
CONT SPECI 4OZ STER CLIK (MISCELLANEOUS) ×1 IMPLANT
COVER PROBE W GEL 5X96 (DRAPES) ×1 IMPLANT
COVER SURGICAL LIGHT HANDLE (MISCELLANEOUS) ×3 IMPLANT
CRADLE DONUT ADULT HEAD (MISCELLANEOUS) ×2 IMPLANT
DEVICE CARDIOBLATE CARDIAC ABL (MISCELLANEOUS) IMPLANT
DRAIN CHANNEL 28F RND 3/8 FF (WOUND CARE) ×2 IMPLANT
DRAIN CHANNEL 32F RND 10.7 FF (WOUND CARE) ×1 IMPLANT
DRAPE BILATERAL SPLIT (DRAPES) ×1 IMPLANT
DRAPE CARDIOVASCULAR INCISE (DRAPES) ×2
DRAPE CV SPLIT W-CLR ANES SCRN (DRAPES) ×1 IMPLANT
DRAPE INCISE IOBAN 66X45 STRL (DRAPES) ×4 IMPLANT
DRAPE SLUSH/WARMER DISC (DRAPES) ×2 IMPLANT
DRAPE SRG 135X102X78XABS (DRAPES) ×1 IMPLANT
DRSG COVADERM 4X14 (GAUZE/BANDAGES/DRESSINGS) ×2 IMPLANT
ELECT REM PT RETURN 9FT ADLT (ELECTROSURGICAL) ×4
ELECTRODE REM PT RTRN 9FT ADLT (ELECTROSURGICAL) ×2 IMPLANT
GLOVE BIO SURGEON STRL SZ 6 (GLOVE) ×5 IMPLANT
GLOVE BIO SURGEON STRL SZ 6.5 (GLOVE) ×1 IMPLANT
GLOVE BIO SURGEON STRL SZ7 (GLOVE) IMPLANT
GLOVE BIO SURGEON STRL SZ7.5 (GLOVE) IMPLANT
GLOVE BIOGEL PI IND STRL 6 (GLOVE) IMPLANT
GLOVE BIOGEL PI IND STRL 6.5 (GLOVE) IMPLANT
GLOVE BIOGEL PI IND STRL 7.0 (GLOVE) IMPLANT
GLOVE BIOGEL PI INDICATOR 6 (GLOVE) ×2
GLOVE BIOGEL PI INDICATOR 6.5 (GLOVE) ×2
GLOVE BIOGEL PI INDICATOR 7.0 (GLOVE) ×2
GLOVE ORTHO TXT STRL SZ7.5 (GLOVE) ×6 IMPLANT
GOWN STRL NON-REIN LRG LVL3 (GOWN DISPOSABLE) ×11 IMPLANT
GRAFT PTCH CORMATRIX 7X10 4PLY (Prosthesis & Implant Heart) ×1 IMPLANT
HEMOSTAT POWDER SURGIFOAM 1G (HEMOSTASIS) ×6 IMPLANT
INSERT FOGARTY XLG (MISCELLANEOUS) ×3 IMPLANT
KIT BASIN OR (CUSTOM PROCEDURE TRAY) ×2 IMPLANT
KIT CATH SUCT 8FR (CATHETERS) IMPLANT
KIT DILATOR VASC 18G NDL (KITS) ×1 IMPLANT
KIT DRAINAGE VACCUM ASSIST (KITS) ×1 IMPLANT
KIT PAIN CUSTOM (MISCELLANEOUS) IMPLANT
KIT ROOM TURNOVER OR (KITS) ×2 IMPLANT
KIT SUCTION CATH 14FR (SUCTIONS) ×6 IMPLANT
LEAD PACING MYOCARDI (MISCELLANEOUS) ×2 IMPLANT
LINE VENT (MISCELLANEOUS) ×1 IMPLANT
LOOP VESSEL SUPERMAXI WHITE (MISCELLANEOUS) ×2 IMPLANT
MARKER GRAFT CORONARY BYPASS (MISCELLANEOUS) ×3 IMPLANT
NS IRRIG 1000ML POUR BTL (IV SOLUTION) ×12 IMPLANT
PACK OPEN HEART (CUSTOM PROCEDURE TRAY) ×2 IMPLANT
PAD ARMBOARD 7.5X6 YLW CONV (MISCELLANEOUS) ×4 IMPLANT
PAD DEFIB R2 (MISCELLANEOUS) ×1 IMPLANT
PENCIL BUTTON HOLSTER BLD 10FT (ELECTRODE) ×2 IMPLANT
SEALANT SURG COSEAL 4ML (VASCULAR PRODUCTS) ×1 IMPLANT
SET CARDIOPLEGIA MPS 5001102 (MISCELLANEOUS) ×1 IMPLANT
SET IRRIG TUBING LAPAROSCOPIC (IRRIGATION / IRRIGATOR) ×3 IMPLANT
SOLUTION ANTI FOG 6CC (MISCELLANEOUS) ×1 IMPLANT
SPONGE GAUZE 4X4 12PLY (GAUZE/BANDAGES/DRESSINGS) ×4 IMPLANT
SUCKER INTRACARDIAC WEIGHTED (SUCKER) ×2 IMPLANT
SUT BONE WAX W31G (SUTURE) ×2 IMPLANT
SUT ETHIBON 2 0 V 52N 30 (SUTURE) ×2 IMPLANT
SUT ETHIBON EXCEL 2-0 V-5 (SUTURE) ×2 IMPLANT
SUT ETHIBOND 2 0 SH (SUTURE) ×6 IMPLANT
SUT ETHIBOND 2 0 SH 36X2 (SUTURE) ×2 IMPLANT
SUT ETHIBOND 2 0 V4 (SUTURE) IMPLANT
SUT ETHIBOND 2 0V4 GREEN (SUTURE) IMPLANT
SUT ETHIBOND 4 0 RB 1 (SUTURE) IMPLANT
SUT ETHIBOND V-5 VALVE (SUTURE) IMPLANT
SUT ETHIBOND X763 2 0 SH 1 (SUTURE) ×8 IMPLANT
SUT MNCRL AB 3-0 PS2 18 (SUTURE) ×4 IMPLANT
SUT PDS AB 1 CTX 36 (SUTURE) ×4 IMPLANT
SUT PROLENE 3 0 SH DA (SUTURE) ×2 IMPLANT
SUT PROLENE 3 0 SH1 36 (SUTURE) ×4 IMPLANT
SUT PROLENE 4 0 RB 1 (SUTURE) ×18
SUT PROLENE 4 0 SH DA (SUTURE) ×1 IMPLANT
SUT PROLENE 4-0 RB1 .5 CRCL 36 (SUTURE) ×2 IMPLANT
SUT PROLENE 5 0 C 1 36 (SUTURE) ×3 IMPLANT
SUT PROLENE 6 0 C 1 30 (SUTURE) ×2 IMPLANT
SUT PROLENE 7.0 RB 3 (SUTURE) ×6 IMPLANT
SUT PROLENE BLUE 7 0 (SUTURE) ×2 IMPLANT
SUT SILK  1 MH (SUTURE) ×4
SUT SILK 1 MH (SUTURE) ×3 IMPLANT
SUT SILK 2 0 SH CR/8 (SUTURE) IMPLANT
SUT SILK 3 0 SH CR/8 (SUTURE) IMPLANT
SUT STEEL 6MS V (SUTURE) IMPLANT
SUT STEEL STERNAL CCS#1 18IN (SUTURE) ×1 IMPLANT
SUT STEEL SZ 6 DBL 3X14 BALL (SUTURE) ×2 IMPLANT
SUT VIC AB 2-0 CTX 27 (SUTURE) ×2 IMPLANT
SUTURE E-PAK OPEN HEART (SUTURE) ×2 IMPLANT
SYR 10ML KIT SKIN ADHESIVE (MISCELLANEOUS) IMPLANT
SYSTEM SAHARA CHEST DRAIN ATS (WOUND CARE) ×3 IMPLANT
TAPE CLOTH SURG 4X10 WHT LF (GAUZE/BANDAGES/DRESSINGS) ×1 IMPLANT
TOWEL OR 17X24 6PK STRL BLUE (TOWEL DISPOSABLE) ×3 IMPLANT
TOWEL OR 17X26 10 PK STRL BLUE (TOWEL DISPOSABLE) ×4 IMPLANT
TRAY FOLEY IC TEMP SENS 14FR (CATHETERS) ×2 IMPLANT
TUBE SUCT INTRACARD DLP 20F (MISCELLANEOUS) ×2 IMPLANT
UNDERPAD 30X30 INCONTINENT (UNDERPADS AND DIAPERS) ×2 IMPLANT
VALVE MAGNA EASE AORTIC 23MM (Prosthesis & Implant Heart) ×1 IMPLANT
VENT LEFT HEART 12002 (CATHETERS) ×2
WATER STERILE IRR 1000ML POUR (IV SOLUTION) ×4 IMPLANT

## 2011-12-06 NOTE — Progress Notes (Signed)
[facility] BRIEF SICU PROGRESS NOTE  Day of Surgery  S/P Procedure(s) (LRB): REDO AORTIC VALVE REPLACEMENT (AVR) (N/A)   Extubated uneventfully Sinus rhythm, BP stable O2 sats 97% Chest tube output low UOP adequate Labs okay  Plan: Continue routine early postop  OWEN,CLARENCE H 12/06/2011 8:07 PM

## 2011-12-06 NOTE — Brief Op Note (Signed)
11/28/2011 - 12/06/2011  2:41 PM  PATIENT:  [name]  66 y.o. 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

## 2011-12-06 NOTE — Op Note (Addendum)
CARDIOTHORACIC SURGERY OPERATIVE NOTE  Date of Procedure:  12/06/2011  Preoperative Diagnosis: Severe Recurrent Aortic Stenosis   Postoperative Diagnosis: Same   Procedure:    Redo Aortic Valve Replacement  Edwards Magna Ease Pericardial Tissue Valve (size 23 mm, model # 3300TFX, serial # [id number])   Surgeon: Salvatore Decent. 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  The patient did well following this initial surgery until recently and he has been followed intermittently by Dr Jens Som for his valvular heart disease, who saw him most recently on 10/13/2010.  His most recent echocardiogram prior to this admission was in January of 2011 and notable for hyperdynamic LV systolic function with a mean transvalvular gradient across the aortic valve of 18 mm Hg. Other medical problems include hypertension, chronic iron deficient anemia with history of GI bleeding, GE reflux disease, obstructive sleep apnea, type II diabetes, urinary incontinence and arthritis.  The patient reports that over the past year he has developed gradual progression of exertional shortness of breath and orthopnea. In July he suffered a syncopal episode. This was associated with an episode of coffee ground emesis and he was noted to have baseline iron deficient anemia with hemoglobin 8.9 at the time of his admission. He was transfused 1 unit packed red blood  cells and underwent both upper GI endoscopy and colonoscopy. No source for GI bleeding was identified. An echocardiogram was not performed during that hospitalization.  The patient has been followed since then by Dr. Leone Payor in the office in consideration has been made for possible capsule endoscopy.  Hemoglobin has remained essentially stable over the past month at 8.5.  Yesterday the patient was awoken from his sleep at 2:30 in the morning because of a headache. Watch television for several hours but when he started to go back to bed he suffered another syncopal episode. He states that on both occasions he could feel something coming on although he denies feeling dizzy per se. He passed out and according to his wife was unresponsive for somewhere between 4 and 6 minutes. He did have urinary incontinence. He subsequently awoke and was brought to the emergency room. He has never had any chest pain.  EKG revealed sinus rhythm and troponins were negative. The patient underwent transthoracic echocardiogram demonstrating severe aortic stenosis with preserved left ventricular systolic function.  Left and right heart catheterization was performed and notable for the presence of severe aortic stenosis with no significant coronary artery disease.  The patient has been seen in consultation and counseled at length regarding the indications, risks and potential benefits of surgery.  All questions have been answered, and the patient provides full informed consent for the operation as described.     DETAILS OF THE OPERATIVE PROCEDURE  The patient is brought to the operating room on the above mentioned date and central monitoring was established by the anesthesia team including placement of Swan-Ganz catheter and radial arterial line. The patient is placed  in the supine position on the operating table.  Intravenous antibiotics are administered. General endotracheal anesthesia is induced uneventfully. A Foley catheter is  placed.  Baseline transesophageal echocardiogram was performed.  Findings were notable for severe aortic stenosis with normal left ventricular systolic function and moderate left ventricular hypertrophy.  The patient's chest, abdomen, both groins, and both lower extremities are prepared and draped in a sterile manner. A time out procedure is performed.  A redo median sternotomy incision was performed and all of the old sternal wires removed.  The sternum is divided with an oscillating saw. Sternal reentry was uneventful. Dissection was now begun anteriorly to free up the undersurface of the sternum away from the structures of the mediastinum. Attention is now directed towards the aorta. There is dense fibrosis surrounding the ascending aortic graft. The distal anastomosis of the old graft is identified in the distal portion of the ascending aorta is identified at the level of the takeoff of the innominate artery. The patient is heparinized systemically. The right common femoral vein is cannulated with Seldinger technique and a flexible guidewire is advanced under TEE guidance through the right atrium into the superior vena cava. The femoral vein is dilated with serial dilators and cannulated using a 22 French femoral venous cannula. The ascending aorta is cannulated directly just below the takeoff of the innominate artery.  The entire pre-bypass portion of the operation was notable for stable hemodynamics.  Cardiopulmonary bypass was begun.  Dissection was now continued overlying the right atrium separating it carefully from the surrounding pericardium. Again there are dense adhesions. A retrograde cardioplegia cannula is placed directly through the right atrium into the coronary sinus. A 14 French single-stage venous cannula was also placed directly into the right atrium and advanced up the superior vena cava to supplement venous drainage finally, an Endo vent catheter is passed through the right atrium  and floated through the right ventricle into the proximal pulmonary artery.  Dissection was now continued overlying the ascending aortic graft in between the graft and the pulmonary artery until adequate room this obtained to allow application of the aortic cross-clamp. An antegrade cardioplegia cannula is placed directly into the ascending aortic graft.  The patient is cooled to 32C systemic temperature.  The aortic cross clamp is applied and cold blood cardioplegia is delivered initially in an antegrade fashion through the aortic root.  Supplemental cardioplegia is given retrograde through the coronary sinus catheter.  Iced saline slush is applied for topical hypothermia.  The initial cardioplegic arrest is rapid with early diastolic arrest.  Repeat doses of cardioplegia are administered intermittently throughout the entire cross clamp portion of the operation through the coronary sinus catheter in order to maintain completely flat electrocardiogram.  Myocardial protection was felt to be excellent.  The antegrade cardioplegia cannula is removed and an oblique incision is made into the ascending aortic graft. Through the graft the old bioprosthetic tissue valve is examined. The valve leaflets are all heavily calcified. There is severe stenosis. The old bioprosthetic tissue valve is removed using an 11 blade knife to carefully excise it from the aortic annulus. After the old valve is been removed it is given directly to a representative from [employer] for explant analysis. The aortic annulus is debris to carefully remove the remaining sutures pledgets and portions of the old valve sewing ring. The native annulus itself is heavily calcified and not very distensible. The aortic annulus sized to accept a 23 mm bioprosthesis. The aortic root  is irrigated with copious saline solution.  Aortic valve replacement was performed using interrupted horizontal mattress 2-0 Ethibond pledgeted sutures with  pledgets in the subannular position.  An [facility] Ease pericardial tissue valve (size 23 mm, model # 3300TFX, serial # [id number]) was implanted uneventfully. The valve seated appropriately with adequate space beneath the left main and right coronary artery.  The ascending aortic graft was closed using a 2-layer closure of running 4-0 Prolene suture.  One final dose of warm retrograde "hot shot" cardioplegia was administered retrograde through the coronary sinus catheter while all air was evacuated through the aortic root.  The aortic cross clamp was removed after a total cross clamp time of 111 minutes.  Epicardial pacing wires are fixed to the right ventricular outflow tract and to the right atrial appendage. The patient is rewarmed to 37C temperature. The pulmonary artery vent, retrograde cannula and SVC cannula are removed.  The patient is weaned and disconnected from cardiopulmonary bypass.  The patient's rhythm at separation from bypass was AV paced.  The patient was weaned from cardioplegic bypass without any inotropic support. Total cardiopulmonary bypass time for the operation was 198 minutes.  The patient was transfused 2 units packed red blood cells during cardioplegia bypass to 2 anemia which was present prior to surgery and exacerbated with hemodilution.  Followup transesophageal echocardiogram performed after separation from bypass revealed a well-seated aortic valve prosthesis that was functioning normally and without any sign of perivalvular leak.  Left ventricular function was unchanged from preoperatively.  The aortic cannula was removed uneventfully. Protamine was administered to reverse the anticoagulation. The femoral venous cannula is removed and manual pressure held on the groin for 30 minutes.  The mediastinum and pleural space were inspected for hemostasis and irrigated with saline solution. The mediastinum and both pleural spaces were drained using 4 chest tubes placed through  separate stab incisions inferiorly.  The soft tissues anterior to the aorta were reapproximated using a patch of Cormatrix to close the pericardium. The sternum is closed with double strength sternal wire. The soft tissues anterior to the sternum were closed in multiple layers and the skin is closed with a running subcuticular skin closure.  The post-bypass portion of the operation was notable for stable rhythm and hemodynamics.  The patient tolerated the procedure well and is transported to the surgical intensive care in stable condition. There are no intraoperative complications. All sponge instrument and needle counts are verified correct at completion of the operation.    Salvatore Decent. Cornelius Moras MD 12/06/2011 3:45 PM

## 2011-12-06 NOTE — Progress Notes (Signed)
  Echocardiogram Echocardiogram Transesophageal has been performed.  [name] 12/06/2011, 9:13 AM

## 2011-12-06 NOTE — Anesthesia Postprocedure Evaluation (Signed)
  Anesthesia Post-op Note  Patient: [name]  Procedure(s) Performed: Procedure(s) (LRB) with comments: REDO AORTIC VALVE REPLACEMENT (AVR) (N/A)  Patient Location: SICU  Anesthesia Type:General  Level of Consciousness: sedated and Patient remains intubated per anesthesia plan  Airway and Oxygen Therapy: Patient remains intubated per anesthesia plan and Patient placed on Ventilator (see vital sign flow sheet for setting)  Post-op Pain: none  Post-op Assessment: Post-op Vital signs reviewed, Patient's Cardiovascular Status Stable, Respiratory Function Stable, Patent Airway, No signs of Nausea or vomiting and Pain level controlled  Post-op Vital Signs: stable  Complications: No apparent anesthesia complications

## 2011-12-06 NOTE — Transfer of Care (Signed)
Immediate Anesthesia Transfer of Care Note  Patient: [name]  Procedure(s) Performed: Procedure(s) (LRB) with comments: REDO AORTIC VALVE REPLACEMENT (AVR) (N/A)  Patient Location: SICU  Anesthesia Type:General  Level of Consciousness: Patient remains intubated per anesthesia plan  Airway & Oxygen Therapy: Patient remains intubated per anesthesia plan and Patient placed on Ventilator (see vital sign flow sheet for setting)  Post-op Assessment: Report given to PACU RN and Post -op Vital signs reviewed and stable  Post vital signs: Reviewed and stable  Complications: No apparent anesthesia complications

## 2011-12-06 NOTE — OR Nursing (Signed)
Explanted aortic valve prosthesis sent down to pathology per policy for sales rep Jorene Minors to retrieve.

## 2011-12-06 NOTE — Preoperative (Signed)
Beta Blockers   Reason not to administer Beta Blockers:Not Applicable. Dose given at 0506 on 10/29.

## 2011-12-06 NOTE — Anesthesia Preprocedure Evaluation (Addendum)
Anesthesia Evaluation  Patient identified by MRN, date of birth, ID band Patient awake    Reviewed: Allergy & Precautions, H&P , NPO status , Patient's Chart, lab work & pertinent test results  Airway Mallampati: I TM Distance: >3 FB Neck ROM: full    Dental  (+) Dental Advisory Given and Teeth Intact   Pulmonary asthma , sleep apnea ,          Cardiovascular hypertension, + Valvular Problems/Murmurs AS Rhythm:regular Rate:Normal     Neuro/Psych  Headaches, PSYCHIATRIC DISORDERS Depression    GI/Hepatic hiatal hernia, GERD-  ,  Endo/Other  diabetes  Renal/GU      Musculoskeletal   Abdominal   Peds  Hematology   Anesthesia Other Findings   Reproductive/Obstetrics                          Anesthesia Physical Anesthesia Plan  ASA: III  Anesthesia Plan: General   Post-op Pain Management:    Induction: Intravenous  Airway Management Planned: Oral ETT  Additional Equipment: Arterial line, CVP, PA Cath and TEE  Intra-op Plan:   Post-operative Plan: Post-operative intubation/ventilation  Informed Consent: I have reviewed the patients History and Physical, chart, labs and discussed the procedure including the risks, benefits and alternatives for the proposed anesthesia with the patient or authorized representative who has indicated his/her understanding and acceptance.     Plan Discussed with: CRNA, Anesthesiologist and Surgeon  Anesthesia Plan Comments:         Anesthesia Quick Evaluation

## 2011-12-06 NOTE — OR Nursing (Signed)
Per Dr. Cornelius Moras, old aortic valve prosthesis removed and given to [facility] sales rep Jorene Minors.

## 2011-12-06 NOTE — Procedures (Signed)
Extubation Procedure Note  Patient Details:   Name: [name] DOB: [birth year] MRN: [id number]   Airway Documentation:   Patient extubated to 4 lpm nasal cannula.  VC 850 ml, NIF -30, able to pick and hold head off bed.  Patient able to breathe around deflated cuff and vocalize post procedure.  Tolerated well, no complications.   Evaluation  O2 sats: stable throughout Complications: No apparent complications Patient did tolerate procedure well. Bilateral Breath Sounds: Clear   Yes  Willean Schurman, Aloha Gell 12/06/2011, 7:42 PM

## 2011-12-06 NOTE — Progress Notes (Signed)
Dr. Cornelius Moras paged with result of 1 hour post extubation abg.  Advised to deep breath.

## 2011-12-06 NOTE — Anesthesia Procedure Notes (Addendum)
Procedure Name: Intubation Date/Time: 12/06/2011 8:24 AM Performed by: Hermelinda Dellen A Pre-anesthesia Checklist: Patient identified, Timeout performed, Emergency Drugs available, Suction available and Patient being monitored Patient Re-evaluated:Patient Re-evaluated prior to inductionOxygen Delivery Method: Circle system utilized Preoxygenation: Pre-oxygenation with 100% oxygen Intubation Type: IV induction Ventilation: Mask ventilation without difficulty and Oral airway inserted - appropriate to patient size Tube size: 8.0 mm Number of attempts: 1 Airway Equipment and Method: Stylet Secured at: 24 cm Tube secured with: Tape Dental Injury: Teeth and Oropharynx as per pre-operative assessment

## 2011-12-07 ENCOUNTER — Inpatient Hospital Stay (HOSPITAL_COMMUNITY): Payer: Medicare Other

## 2011-12-07 DIAGNOSIS — J9819 Other pulmonary collapse: Secondary | ICD-10-CM | POA: Diagnosis not present

## 2011-12-07 DIAGNOSIS — R55 Syncope and collapse: Secondary | ICD-10-CM | POA: Diagnosis not present

## 2011-12-07 DIAGNOSIS — I359 Nonrheumatic aortic valve disorder, unspecified: Secondary | ICD-10-CM | POA: Diagnosis not present

## 2011-12-07 DIAGNOSIS — Z09 Encounter for follow-up examination after completed treatment for conditions other than malignant neoplasm: Secondary | ICD-10-CM | POA: Diagnosis not present

## 2011-12-07 LAB — POCT I-STAT, CHEM 8
BUN: 10 mg/dL (ref 6–23)
Calcium, Ion: 1.25 mmol/L (ref 1.13–1.30)
Creatinine, Ser: 0.8 mg/dL (ref 0.50–1.35)
Hemoglobin: 8.5 g/dL — ABNORMAL LOW (ref 13.0–17.0)
Sodium: 134 mEq/L — ABNORMAL LOW (ref 135–145)
TCO2: 25 mmol/L (ref 0–100)

## 2011-12-07 LAB — PREPARE FRESH FROZEN PLASMA: Unit division: 0

## 2011-12-07 LAB — PREPARE PLATELET PHERESIS
Unit division: 0
Unit division: 0

## 2011-12-07 LAB — CREATININE, SERUM: GFR calc non Af Amer: >90 mL/min (ref >90)

## 2011-12-07 LAB — CBC
HCT: 24.9 % — ABNORMAL LOW (ref 39.0–52.0)
HCT: 25.2 % — ABNORMAL LOW (ref 39.0–52.0)
Hemoglobin: 7.9 g/dL — ABNORMAL LOW (ref 13.0–17.0)
MCH: 27.4 pg (ref 26.0–34.0)
MCHC: 31.3 g/dL (ref 30.0–36.0)
RDW: 17.7 % — ABNORMAL HIGH (ref 11.5–15.5)
RDW: 17.8 % — ABNORMAL HIGH (ref 11.5–15.5)
WBC: 11.7 10*3/uL — ABNORMAL HIGH (ref 4.0–10.5)

## 2011-12-07 LAB — GLUCOSE, CAPILLARY
Glucose-Capillary: 108 mg/dL — ABNORMAL HIGH (ref 70–99)
Glucose-Capillary: 113 mg/dL — ABNORMAL HIGH (ref 70–99)
Glucose-Capillary: 103 mg/dL — ABNORMAL HIGH (ref 70–99)
Glucose-Capillary: 95 mg/dL (ref 70–99)
Glucose-Capillary: 137 mg/dL — ABNORMAL HIGH (ref 70–99)
Glucose-Capillary: 126 mg/dL — ABNORMAL HIGH (ref 70–99)

## 2011-12-07 LAB — MAGNESIUM
Magnesium: 2.6 mg/dL — ABNORMAL HIGH (ref 1.5–2.5)
Magnesium: 2.2 mg/dL (ref 1.5–2.5)

## 2011-12-07 LAB — BASIC METABOLIC PANEL
BUN: 10 mg/dL (ref 6–23)
Chloride: 102 mEq/L (ref 96–112)
GFR calc Af Amer: >90 mL/min (ref >90)
Potassium: 3.9 mEq/L (ref 3.5–5.1)
Sodium: 135 mEq/L (ref 135–145)

## 2011-12-07 MED ORDER — FUROSEMIDE 10 MG/ML IJ SOLN: 40.0000 mg | Freq: Once | INTRAMUSCULAR | Status: DC

## 2011-12-07 MED ORDER — METOPROLOL TARTRATE 25 MG PO TABS
25.0000 mg | ORAL_TABLET | Freq: Two times a day (BID) | ORAL | Status: DC
  Administered 2011-12-07 – 2011-12-10 (×7): 25 mg via ORAL
  Filled 2011-12-07 (×7): qty 1

## 2011-12-07 MED ORDER — CLONIDINE HCL 0.2 MG PO TABS
0.2000 mg | ORAL_TABLET | Freq: Two times a day (BID) | ORAL | Status: DC
  Administered 2011-12-07 – 2011-12-10 (×7): 0.2 mg via ORAL
  Filled 2011-12-07 (×8): qty 1

## 2011-12-07 MED ORDER — FINASTERIDE 5 MG PO TABS
5.0000 mg | ORAL_TABLET | Freq: Every day | ORAL | Status: DC
  Administered 2011-12-07 – 2011-12-13 (×7): 5 mg via ORAL
  Filled 2011-12-07 (×7): qty 1

## 2011-12-07 MED ORDER — INSULIN ASPART 100 UNIT/ML ~~LOC~~ SOLN
0.0000 [IU] | SUBCUTANEOUS | Status: DC
  Administered 2011-12-07: 2 [IU] via SUBCUTANEOUS

## 2011-12-07 MED ORDER — FUROSEMIDE 10 MG/ML IJ SOLN
20.0000 mg | Freq: Four times a day (QID) | INTRAMUSCULAR | Status: AC
  Administered 2011-12-07 (×3): 20 mg via INTRAVENOUS
  Filled 2011-12-07: qty 2

## 2011-12-07 MED ORDER — INSULIN ASPART 100 UNIT/ML ~~LOC~~ SOLN: 0.0000 [IU] | SUBCUTANEOUS | Status: DC

## 2011-12-07 MED ORDER — MORPHINE SULFATE 2 MG/ML IJ SOLN: 2.0000 mg | INTRAMUSCULAR | Status: DC | PRN

## 2011-12-07 MED ORDER — LABETALOL HCL 5 MG/ML IV SOLN: 10.0000 mg | INTRAVENOUS | Status: DC | PRN

## 2011-12-07 MED ORDER — GABAPENTIN 300 MG PO CAPS
300.0000 mg | ORAL_CAPSULE | Freq: Two times a day (BID) | ORAL | Status: DC
  Administered 2011-12-07 – 2011-12-13 (×13): 300 mg via ORAL
  Filled 2011-12-07 (×14): qty 1

## 2011-12-07 MED ORDER — POTASSIUM CHLORIDE CRYS ER 20 MEQ PO TBCR: 20.0000 meq | EXTENDED_RELEASE_TABLET | Freq: Once | ORAL | Status: DC

## 2011-12-07 MED ORDER — CARBIDOPA-LEVODOPA 25-100 MG PO TABS
1.0000 | ORAL_TABLET | Freq: Two times a day (BID) | ORAL | Status: DC
  Administered 2011-12-07 – 2011-12-13 (×13): 1 via ORAL
  Filled 2011-12-07 (×14): qty 1

## 2011-12-07 MED ORDER — POTASSIUM CHLORIDE 10 MEQ/50ML IV SOLN
10.0000 meq | INTRAVENOUS | Status: AC
  Administered 2011-12-07 (×3): 10 meq via INTRAVENOUS
  Filled 2011-12-07: qty 150

## 2011-12-07 MED ORDER — INSULIN ASPART 100 UNIT/ML ~~LOC~~ SOLN
0.0000 [IU] | SUBCUTANEOUS | Status: DC
  Administered 2011-12-07 (×2): 2 [IU] via SUBCUTANEOUS

## 2011-12-07 MED FILL — Aminocaproic Acid Inj 250 MG/ML: INTRAVENOUS | Qty: 20 | Status: AC

## 2011-12-07 MED FILL — Magnesium Sulfate Inj 50%: INTRAMUSCULAR | Qty: 2 | Status: AC

## 2011-12-07 MED FILL — Potassium Chloride Inj 2 mEq/ML: INTRAVENOUS | Qty: 40 | Status: AC

## 2011-12-07 NOTE — Progress Notes (Signed)
Subjective:  Doing well post aortic valve replacement. Rhythm stable NSR. Minimal pain.  Objective:  Vital Signs in the last 24 hours: Temp:  [97.2 F (36.2 C)-100 F (37.8 C)] 99.5 F (37.5 C) (10/30 0915) Pulse Rate:  [64-91] 89  (10/30 0915) Resp:  [10-26] 18  (10/30 0915) BP: (73-159)/(51-82) 119/82 mmHg (10/30 0915) SpO2:  [90 %-100 %] 91 % (10/30 0915) Arterial Line BP: (73-180)/(42-76) 169/66 mmHg (10/30 0915) FiO2 (%):  [39.8 %-50.2 %] 39.8 % (10/29 1937) Weight:  [195 lb 1.7 oz (88.5 kg)] 195 lb 1.7 oz (88.5 kg) (10/30 0300)  Intake/Output from previous day: 10/29 0701 - 10/30 0700 In: 6560.7 [I.V.:4758.7; Blood:752; IV Piggyback:1050] Out: 6001 [Urine:3380; Blood:1800; Chest Tube:821] Intake/Output from this shift: Total I/O In: 140.7 [I.V.:140.7] Out: -      . acetaminophen  1,000 mg Intravenous Once  . acetaminophen  1,000 mg Oral Q6H   Or  . acetaminophen (TYLENOL) oral liquid 160 mg/5 mL  975 mg Per Tube Q6H  . aminocaproic acid (AMICAR) for OHS   Intravenous To OR  . aspirin EC  325 mg Oral Daily   Or  . aspirin  324 mg Per Tube Daily  . bisacodyl  10 mg Oral Daily   Or  . bisacodyl  10 mg Rectal Daily  . cefUROXime (ZINACEF)  IV  1.5 g Intravenous To OR  . cefUROXime (ZINACEF)  IV  1.5 g Intravenous Q12H  . docusate sodium  200 mg Oral Daily  . furosemide  40 mg Intravenous Once  . insulin aspart  0-24 Units Subcutaneous Q4H  . magnesium sulfate  4 g Intravenous Once  . metoprolol tartrate  12.5 mg Oral BID   Or  . metoprolol tartrate  12.5 mg Per Tube BID  . pantoprazole  40 mg Oral Daily  . phenylephrine (NEO-SYNEPHRINE) Adult infusion  30-200 mcg/min Intravenous To OR  . potassium chloride  10 mEq Intravenous Q1 Hr x 3  . potassium chloride  20 mEq Oral Once  . sodium chloride  3 mL Intravenous Q12H  . vancomycin  1,000 mg Intravenous Once  . DISCONTD: aspirin EC  81 mg Oral Daily  . DISCONTD: carbidopa-levodopa  2 tablet Oral Q breakfast    . DISCONTD: cefUROXime (ZINACEF)  IV  750 mg Intravenous To OR  . DISCONTD: chlorhexidine  15 mL Mouth/Throat BID  . DISCONTD: cloNIDine  0.1 mg Oral BID  . DISCONTD: DOPamine  2-20 mcg/kg/min Intravenous To OR  . DISCONTD: epinephrine  0.5-20 mcg/min Intravenous To OR  . DISCONTD: famotidine (PEPCID) IV  20 mg Intravenous Q12H  . DISCONTD: ferrous gluconate  324 mg Oral BID WC  . DISCONTD: finasteride  5 mg Oral Daily  . DISCONTD: furosemide  20 mg Oral Daily  . DISCONTD: gabapentin  300 mg Oral BID  . DISCONTD: insulin aspart  0-24 Units Subcutaneous Q2H  . DISCONTD: insulin aspart  0-24 Units Subcutaneous Q4H  . DISCONTD: insulin regular  0-10 Units Intravenous TID WC  . DISCONTD: loratadine  10 mg Oral Daily  . DISCONTD: magnesium sulfate  40 mEq Other To OR  . DISCONTD: omega-3 acid ethyl esters  1 g Oral BID  . DISCONTD: pantoprazole  40 mg Oral Daily  . DISCONTD: polyethylene glycol  17 g Oral Daily  . DISCONTD: potassium chloride  80 mEq Other To OR  . DISCONTD: senna-docusate  1 tablet Oral BID  . DISCONTD: sodium chloride  3 mL Intravenous Q12H  .  DISCONTD: zolpidem  5 mg Oral QHS      . sodium chloride 20 mL/hr at 12/07/11 0900  . sodium chloride 20 mL/hr at 12/07/11 0900  . sodium chloride 250 mL (12/07/11 0900)  . dexmedetomidine Stopped (12/06/11 1945)  . lactated ringers 20 mL/hr at 12/07/11 0900  . nitroGLYCERIN 30 mcg/min (12/07/11 0922)  . phenylephrine (NEO-SYNEPHRINE) Adult infusion Stopped (12/06/11 1808)  . DISCONTD: insulin (NOVOLIN-R) infusion Stopped (12/07/11 0300)    Physical Exam: The patient appears to be in no distress.  Heart sounds of good quality.  No rub. No gallop. No murmur.   Lab Results:  Basename 12/07/11 0415 12/06/11 2137 12/06/11 2130  WBC 11.7* -- 10.2  HGB 8.0* 7.8* --  PLT 180 -- 159    Basename 12/07/11 0415 12/06/11 2137 12/06/11 0515  NA 135 139 --  K 3.9 4.0 --  CL 102 105 --  CO2 24 -- 25  GLUCOSE 111* 116* --   BUN 10 10 --  CREATININE 0.65 0.70 --   No results found for this basename: TROPONINI:2,CK,MB:2 in the last 72 hours Hepatic Function Panel No results found for this basename: PROT,ALBUMIN,AST,ALT,ALKPHOS,BILITOT,BILIDIR,IBILI in the last 72 hours No results found for this basename: CHOL in the last 72 hours No results found for this basename: PROTIME in the last 72 hours  Imaging: Dg Chest Portable 1 View In Am  12/07/2011  *RADIOLOGY REPORT*  Clinical Data: Postop times 1 day  PORTABLE CHEST - 1 VIEW  Comparison: Portable chest x-ray of 12/06/2011  Findings: The lungs are not as well aerated with some increase in bibasilar atelectasis.  Cardiomegaly and perhaps mild pulmonary vascular congestion is noted.  Swan-Ganz catheter and left chest tube remain, with the endotracheal tube having been removed.  IMPRESSION: Endotracheal tube removed.  Decreased aeration with increase in basilar atelectasis.  Question mild pulmonary vascular congestion.   Original Report Authenticated By: Juline Patch, M.D.    Dg Chest Portable 1 View  12/06/2011  *RADIOLOGY REPORT*  Clinical Data: Postoperative.  PORTABLE CHEST - 1 VIEW  Comparison: 11/30/2011.  Findings: The patient is now status post cardiac valve repair.  An endotracheal tube is in position with the tip 4.2 cm above the carina.  A pulmonary catheter has been inserted through a the right IJ Cordis sheath.  The tip of the pulmonary artery catheter is in the right pulmonary artery.  A nasogastric tube extends through the mediastinum into the stomach with the tip in the fundus.  There is a mediastinal drain in place to the left of the spine with the tip at the axial level of the carina.  There is a left thoracic tube in place with its tip along the lateral margin of the left fifth rib. There is right-sided thoracic tube  overlying the medial right hemidiaphragm  with its tip along the inferior aspect of the right heart border.  The lungs are not well  expanded and there are patchy atelectatic changes present; but, there is no evidence of edema, effusions or pneumothoraces.  There are no acute bony changes.  IMPRESSION: Postoperative findings with support equipment in position as described.  No pneumothorax, edema or consolidation.   Original Report Authenticated By: Mervin Hack, M.D.     Cardiac Studies:  Assessment/Plan:  Patient Active Hospital Problem List: S/P aortic valve replacement with bioprosthetic valve (12/06/2011)   Assessment: Doing well.   Plan: As per TCTS     LOS: 9 days  Cassell Clement 12/07/2011, 9:26 AM

## 2011-12-07 NOTE — Progress Notes (Addendum)
TCTS DAILY PROGRESS NOTE                   [address]            [place] [address]          [phone]      1 Day Post-Op Procedure(s) (LRB): REDO AORTIC VALVE REPLACEMENT (AVR) (N/A)  Total Length of Stay:  LOS: 9 days   Subjective: Some neck discomfort, chronic but mostly feels well   Objective: Vital signs in last 24 hours: Temp:  [97.2 F (36.2 C)-100 F (37.8 C)] 99.3 F (37.4 C) (10/30 0800) Pulse Rate:  [64-91] 89  (10/30 0800) Cardiac Rhythm:  [-] Normal sinus rhythm (10/30 0800) Resp:  [10-26] 20  (10/30 0800) BP: (73-159)/(51-77) 151/63 mmHg (10/30 0800) SpO2:  [90 %-100 %] 95 % (10/30 0800) Arterial Line BP: (73-178)/(42-76) 178/74 mmHg (10/30 0800) FiO2 (%):  [39.8 %-50.2 %] 39.8 % (10/29 1937) Weight:  [195 lb 1.7 oz (88.5 kg)] 195 lb 1.7 oz (88.5 kg) (10/30 0300)  Filed Weights   12/05/11 0511 12/06/11 0414 12/07/11 0300  Weight: 178 lb 9.2 oz (81 kg) 178 lb 12.7 oz (81.1 kg) 195 lb 1.7 oz (88.5 kg)    Weight change: 16 lb 5 oz (7.4 kg)   Hemodynamic parameters for last 24 hours: PAP: (41-72)/(17-33) 59/25 mmHg CO:  [4.1 L/min-7.1 L/min] 6.5 L/min CI:  [2.1 L/min/m2-3.6 L/min/m2] 3.3 L/min/m2  Intake/Output from previous day: 10/29 0701 - 10/30 0700 In: 6560.7 [I.V.:4758.7; Blood:752; IV Piggyback:1050] Out: 6001 [Urine:3380; Blood:1800; Chest Tube:821]  Intake/Output this shift: Total I/O In: 70 [I.V.:70] Out: -   Current Meds: Scheduled Meds:   . acetaminophen  1,000 mg Intravenous Once  . acetaminophen  1,000 mg Oral Q6H   Or  . acetaminophen (TYLENOL) oral liquid 160 mg/5 mL  975 mg Per Tube Q6H  . aminocaproic acid (AMICAR) for OHS   Intravenous To OR  . aspirin EC  325 mg Oral Daily   Or  . aspirin  324 mg Per Tube Daily  . bisacodyl  10 mg Oral Daily   Or  . bisacodyl  10 mg Rectal Daily  . cefUROXime (ZINACEF)  IV  1.5 g Intravenous To OR  . cefUROXime (ZINACEF)  IV  1.5 g Intravenous Q12H  . docusate  sodium  200 mg Oral Daily  . insulin aspart  0-24 Units Subcutaneous Q2H   Followed by  . insulin aspart  0-24 Units Subcutaneous Q4H  . insulin (NOVOLIN-R) infusion   Intravenous To OR  . magnesium sulfate  4 g Intravenous Once  . metoprolol tartrate  12.5 mg Oral BID   Or  . metoprolol tartrate  12.5 mg Per Tube BID  . pantoprazole  40 mg Oral Daily  . phenylephrine (NEO-SYNEPHRINE) Adult infusion  30-200 mcg/min Intravenous To OR  . potassium chloride  10 mEq Intravenous Q1 Hr x 3  . sodium chloride  3 mL Intravenous Q12H  . vancomycin  1,000 mg Intravenous Once  . DISCONTD: aspirin EC  81 mg Oral Daily  . DISCONTD: carbidopa-levodopa  2 tablet Oral Q breakfast  . DISCONTD: cefUROXime (ZINACEF)  IV  750 mg Intravenous To OR  . DISCONTD: chlorhexidine  15 mL Mouth/Throat BID  . DISCONTD: cloNIDine  0.1 mg Oral BID  . DISCONTD: DOPamine  2-20 mcg/kg/min Intravenous To OR  . DISCONTD: epinephrine  0.5-20 mcg/min Intravenous To OR  . DISCONTD: famotidine (PEPCID) IV  20 mg Intravenous Q12H  . DISCONTD: ferrous gluconate  324 mg Oral BID WC  . DISCONTD: finasteride  5 mg Oral Daily  . DISCONTD: furosemide  20 mg Oral Daily  . DISCONTD: gabapentin  300 mg Oral BID  . DISCONTD: insulin regular  0-10 Units Intravenous TID WC  . DISCONTD: loratadine  10 mg Oral Daily  . DISCONTD: magnesium sulfate  40 mEq Other To OR  . DISCONTD: omega-3 acid ethyl esters  1 g Oral BID  . DISCONTD: pantoprazole  40 mg Oral Daily  . DISCONTD: polyethylene glycol  17 g Oral Daily  . DISCONTD: potassium chloride  80 mEq Other To OR  . DISCONTD: senna-docusate  1 tablet Oral BID  . DISCONTD: sodium chloride  3 mL Intravenous Q12H  . DISCONTD: zolpidem  5 mg Oral QHS   Continuous Infusions:   . sodium chloride 20 mL/hr at 12/07/11 0800  . sodium chloride 20 mL/hr at 12/07/11 0800  . sodium chloride 250 mL (12/07/11 0847)  . dexmedetomidine Stopped (12/06/11 1945)  . lactated ringers 20 mL/hr at  12/07/11 0800  . nitroGLYCERIN 33.333 mcg/min (12/07/11 0800)  . phenylephrine (NEO-SYNEPHRINE) Adult infusion Stopped (12/06/11 1808)  . DISCONTD: insulin (NOVOLIN-R) infusion Stopped (12/07/11 0300)   PRN Meds:.albumin human, calcium chloride, metoprolol, morphine injection, morphine injection, ondansetron (ZOFRAN) IV, oxyCODONE, sodium chloride, DISCONTD: alum & mag hydroxide-simeth, DISCONTD: hemostatic agents, DISCONTD: midazolam, DISCONTD: ondansetron (ZOFRAN) IV, DISCONTD: ondansetron, DISCONTD: sodium chloride irrigation, DISCONTD: sodium chloride irrigation, DISCONTD: Surgifoam 1 Gm with 0.9% sodium chloride (4 ml) topical solution  General appearance: alert, cooperative and no distress Neurologic: intact Heart: regular rate and rhythm and soft systolic flow murmur Lungs: clear anteriorly Abdomen: soft, nontender Extremities: min edema Wound: ressings CDI  Lab Results: CBC: Basename 12/07/11 0415 12/06/11 2137 12/06/11 2130  WBC 11.7* -- 10.2  HGB 8.0* 7.8* --  HCT 24.9* 23.0* --  PLT 180 -- 159   BMET:  Basename 12/07/11 0415 12/06/11 2137 12/06/11 0515  NA 135 139 --  K 3.9 4.0 --  CL 102 105 --  CO2 24 -- 25  GLUCOSE 111* 116* --  BUN 10 10 --  CREATININE 0.65 0.70 --  CALCIUM 8.7 -- 10.2    PT/INR:  Basename 12/06/11 1530  LABPROT 17.9*  INR 1.52*   Radiology: Dg Chest Portable 1 View In Am  12/07/2011  *RADIOLOGY REPORT*  Clinical Data: Postop times 1 day  PORTABLE CHEST - 1 VIEW  Comparison: Portable chest x-ray of 12/06/2011  Findings: The lungs are not as well aerated with some increase in bibasilar atelectasis.  Cardiomegaly and perhaps mild pulmonary vascular congestion is noted.  Swan-Ganz catheter and left chest tube remain, with the endotracheal tube having been removed.  IMPRESSION: Endotracheal tube removed.  Decreased aeration with increase in basilar atelectasis.  Question mild pulmonary vascular congestion.   Original Report Authenticated By: Juline Patch, M.D.    Dg Chest Portable 1 View  12/06/2011  *RADIOLOGY REPORT*  Clinical Data: Postoperative.  PORTABLE CHEST - 1 VIEW  Comparison: 11/30/2011.  Findings: The patient is now status post cardiac valve repair.  An endotracheal tube is in position with the tip 4.2 cm above the carina.  A pulmonary catheter has been inserted through a the right IJ Cordis sheath.  The tip of the pulmonary artery catheter is in the right pulmonary artery.  A nasogastric tube extends through the mediastinum into the stomach with the tip in the fundus.  There is a mediastinal drain in place to the left of the spine with the tip at the axial level of the carina.  There is a left thoracic tube in place with its tip along the lateral margin of the left fifth rib. There is right-sided thoracic tube  overlying the medial right hemidiaphragm  with its tip along the inferior aspect of the right heart border.  The lungs are not well expanded and there are patchy atelectatic changes present; but, there is no evidence of edema, effusions or pneumothoraces.  There are no acute bony changes.  IMPRESSION: Postoperative findings with support equipment in position as described.  No pneumothorax, edema or consolidation.   Original Report Authenticated By: Mervin Hack, M.D.      Assessment/Plan: S/P Procedure(s) (LRB): REDO AORTIC VALVE REPLACEMENT (AVR) (N/A)  1. Overall, doing well 2 wean nitro as able- aline bp higher than cuff, d/c aline 3 moderate chest tube drainage , d/c MT 4 expected ABL anemia- monitor 5 nl renal function- volume overload, gentle diuresis 6 analgesics as needed- acute on chronic pain 7 cbg's controlled 8 see progression orders GOLD,WAYNE E 12/07/2011 8:50 AM    I have seen and examined the patient and agree with the assessment and plan as outlined.  Doing well POD1 although hypertensive.  Restart clonidine, finasteride and Sinemet, increase beta blocker, and start diuretics.  Keep chest  tubes in for now.  Supplement K+ intravenously rather than oral this early postop.  Fahima Cifelli H 12/07/2011 10:58 AM

## 2011-12-07 NOTE — Progress Notes (Signed)
TCTS PM rounds  NSR resting comfortably Pm labs   Lab Results  Component Value Date   CREATININE 0.80 12/07/2011   BUN 10 12/07/2011   NA 134* 12/07/2011   K 4.4 12/07/2011   CL 97 12/07/2011   CO2 24 12/07/2011   Doing well

## 2011-12-07 NOTE — Plan of Care (Signed)
Problem: Phase II Progression Outcomes Goal: Patient extubated within - Outcome: Completed/Met Date Met:  12/07/11 Within 6 hours

## 2011-12-08 ENCOUNTER — Inpatient Hospital Stay (HOSPITAL_COMMUNITY): Payer: Medicare Other

## 2011-12-08 ENCOUNTER — Encounter (HOSPITAL_COMMUNITY): Payer: Self-pay | Admitting: Thoracic Surgery (Cardiothoracic Vascular Surgery)

## 2011-12-08 ENCOUNTER — Ambulatory Visit: Payer: Medicare Other | Admitting: Cardiology

## 2011-12-08 DIAGNOSIS — Z48812 Encounter for surgical aftercare following surgery on the circulatory system: Secondary | ICD-10-CM | POA: Diagnosis not present

## 2011-12-08 LAB — GLUCOSE, CAPILLARY
Glucose-Capillary: 112 mg/dL — ABNORMAL HIGH (ref 70–99)
Glucose-Capillary: 117 mg/dL — ABNORMAL HIGH (ref 70–99)
Glucose-Capillary: 114 mg/dL — ABNORMAL HIGH (ref 70–99)

## 2011-12-08 LAB — BASIC METABOLIC PANEL
BUN: 10 mg/dL (ref 6–23)
Calcium: 8.6 mg/dL (ref 8.4–10.5)
GFR calc Af Amer: >90 mL/min (ref >90)
GFR calc non Af Amer: >90 mL/min (ref >90)
Glucose, Bld: 97 mg/dL (ref 70–99)
Potassium: 3.9 mEq/L (ref 3.5–5.1)

## 2011-12-08 LAB — CBC
Hemoglobin: 7 g/dL — ABNORMAL LOW (ref 13.0–17.0)
MCH: 28.1 pg (ref 26.0–34.0)
MCHC: 31.8 g/dL (ref 30.0–36.0)
Platelets: 152 10*3/uL (ref 150–400)
RDW: 17.5 % — ABNORMAL HIGH (ref 11.5–15.5)

## 2011-12-08 MED ORDER — SODIUM CHLORIDE 0.9 % IJ SOLN: 3.0000 mL | INTRAMUSCULAR | Status: DC | PRN

## 2011-12-08 MED ORDER — POTASSIUM CHLORIDE CRYS ER 20 MEQ PO TBCR
20.0000 meq | EXTENDED_RELEASE_TABLET | Freq: Every day | ORAL | Status: DC
  Administered 2011-12-09 – 2011-12-13 (×5): 20 meq via ORAL
  Filled 2011-12-08 (×6): qty 1

## 2011-12-08 MED ORDER — FUROSEMIDE 40 MG PO TABS
40.0000 mg | ORAL_TABLET | Freq: Every day | ORAL | Status: DC
  Administered 2011-12-09 – 2011-12-13 (×5): 40 mg via ORAL
  Filled 2011-12-08 (×5): qty 1

## 2011-12-08 MED ORDER — SODIUM CHLORIDE 0.9 % IV SOLN: 250.0000 mL | INTRAVENOUS | Status: DC | PRN

## 2011-12-08 MED ORDER — TRAMADOL HCL 50 MG PO TABS
50.0000 mg | ORAL_TABLET | ORAL | Status: DC | PRN
  Administered 2011-12-11 – 2011-12-13 (×7): 100 mg via ORAL
  Filled 2011-12-08 (×7): qty 2

## 2011-12-08 MED ORDER — FUROSEMIDE 10 MG/ML IJ SOLN
20.0000 mg | Freq: Four times a day (QID) | INTRAMUSCULAR | Status: AC
  Administered 2011-12-08 (×2): 20 mg via INTRAVENOUS
  Filled 2011-12-08 (×2): qty 2

## 2011-12-08 MED ORDER — FUROSEMIDE 10 MG/ML IJ SOLN: 20.0000 mg | Freq: Four times a day (QID) | INTRAMUSCULAR | Status: DC

## 2011-12-08 MED ORDER — MOVING RIGHT ALONG BOOK
Freq: Once | Status: AC
  Administered 2011-12-08: 11:00:00
  Filled 2011-12-08: qty 1

## 2011-12-08 MED ORDER — POTASSIUM CHLORIDE 10 MEQ/50ML IV SOLN
10.0000 meq | INTRAVENOUS | Status: AC
  Administered 2011-12-08 (×3): 10 meq via INTRAVENOUS
  Filled 2011-12-08: qty 150

## 2011-12-08 MED ORDER — FUROSEMIDE 10 MG/ML IJ SOLN: 20.0000 mg | Freq: Once | INTRAMUSCULAR | Status: DC

## 2011-12-08 MED ORDER — MIDAZOLAM HCL 2 MG/2ML IJ SOLN: 2.0000 mg | Freq: Once | INTRAMUSCULAR | Status: DC

## 2011-12-08 MED ORDER — LOSARTAN POTASSIUM 50 MG PO TABS
50.0000 mg | ORAL_TABLET | Freq: Every day | ORAL | Status: DC
  Administered 2011-12-10: 50 mg via ORAL
  Filled 2011-12-08 (×2): qty 1

## 2011-12-08 MED ORDER — SODIUM CHLORIDE 0.9 % IJ SOLN
3.0000 mL | Freq: Two times a day (BID) | INTRAMUSCULAR | Status: DC
Start: 2011-12-08 — End: 2011-12-13
  Administered 2011-12-08 – 2011-12-12 (×9): 3 mL via INTRAVENOUS

## 2011-12-08 MED ORDER — INSULIN ASPART 100 UNIT/ML ~~LOC~~ SOLN
0.0000 [IU] | Freq: Three times a day (TID) | SUBCUTANEOUS | Status: DC
  Administered 2011-12-08: 2 [IU] via SUBCUTANEOUS

## 2011-12-08 NOTE — Progress Notes (Signed)
   CARDIOTHORACIC SURGERY PROGRESS NOTE   R2 Days Post-Op Procedure(s) (LRB): REDO AORTIC VALVE REPLACEMENT (AVR) (N/A)  Subjective: Feels well except for mild soreness in chest.  Objective: Vital signs: BP Readings from Last 1 Encounters:  12/08/11 104/67   Pulse Readings from Last 1 Encounters:  12/08/11 67   Resp Readings from Last 1 Encounters:  12/08/11 13   Temp Readings from Last 1 Encounters:  12/08/11 98.8 F (37.1 C) Oral    Hemodynamics: PAP: (35-73)/(15-41) 40/17 mmHg CO:  [8.6 L/min-8.7 L/min] 8.6 L/min CI:  [4.2 L/min/m2-4.4 L/min/m2] 4.2 L/min/m2  Physical Exam:  Rhythm:   sinus  Breath sounds: clear  Heart sounds:  RRR  Incisions:  Dressings dry, intact  Abdomen:  Soft, non distended, non tender  Extremities:  Warm, well perfused   Intake/Output from previous day: 10/30 0701 - 10/31 0700 In: 1322.3 [P.O.:400; I.V.:668.3; IV Piggyback:254] Out: 2605 [Urine:2195; Chest Tube:410] Intake/Output this shift:    Lab Results:  Basename 12/08/11 0340 12/07/11 1637 12/07/11 1620  WBC 7.3 -- 10.4  HGB 7.0* 8.5* --  HCT 22.0* 25.0* --  PLT 152 -- 183   BMET:  Basename 12/08/11 0340 12/07/11 1637 12/07/11 0415  NA 130* 134* --  K 3.9 4.4 --  CL 97 97 --  CO2 28 -- 24  GLUCOSE 97 143* --  BUN 10 10 --  CREATININE 0.67 0.80 --  CALCIUM 8.6 -- 8.7    CBG (last 3)   Basename 12/08/11 0427 12/08/11 0041 12/07/11 1947  GLUCAP 79 99 126*   ABG    Component Value Date/Time   PHART 7.329* 12/06/2011 2017   HCO3 23.1 12/06/2011 2017   TCO2 25 12/07/2011 1637   ACIDBASEDEF 3.0* 12/06/2011 2017   O2SAT 88.0 12/06/2011 2017   CXR: Clear, low lung volumes  Assessment/Plan: S/P Procedure(s) (LRB): REDO AORTIC VALVE REPLACEMENT (AVR) (N/A)  Doing well POD2 Expected post op acute blood loss anemia, hemoglobin decreased to 7.0 Expected post op volume excess, mild, diuresing Type II diabetes mellitus, good glycemic control HTN under good  control   Will transfuse 1 u PRBC's today for worsened acute blood loss anemia  Continue diuresis  Mobilize  Transfer step down  Restart metformin once po intake improves  Restart ARB at half home preop dose    Ray,[name] 12/08/2011 8:04 AM

## 2011-12-09 ENCOUNTER — Inpatient Hospital Stay (HOSPITAL_COMMUNITY): Payer: Medicare Other

## 2011-12-09 DIAGNOSIS — I359 Nonrheumatic aortic valve disorder, unspecified: Secondary | ICD-10-CM | POA: Diagnosis not present

## 2011-12-09 DIAGNOSIS — J9819 Other pulmonary collapse: Secondary | ICD-10-CM | POA: Diagnosis not present

## 2011-12-09 LAB — TYPE AND SCREEN
ABO/RH(D): A NEG
Antibody Screen: NEGATIVE
Unit division: 0
Unit division: 0
Unit division: 0

## 2011-12-09 LAB — BASIC METABOLIC PANEL
CO2: 27 mEq/L (ref 19–32)
GFR calc non Af Amer: >90 mL/min (ref >90)
Glucose, Bld: 100 mg/dL — ABNORMAL HIGH (ref 70–99)
Potassium: 3.7 mEq/L (ref 3.5–5.1)
Sodium: 132 mEq/L — ABNORMAL LOW (ref 135–145)

## 2011-12-09 LAB — CBC
Hemoglobin: 8.2 g/dL — ABNORMAL LOW (ref 13.0–17.0)
RBC: 2.99 MIL/uL — ABNORMAL LOW (ref 4.22–5.81)
WBC: 7.9 10*3/uL (ref 4.0–10.5)

## 2011-12-09 LAB — GLUCOSE, CAPILLARY: Glucose-Capillary: 113 mg/dL — ABNORMAL HIGH (ref 70–99)

## 2011-12-09 MED ORDER — TRAMADOL HCL 50 MG PO TABS: 50.0000 mg | ORAL_TABLET | Freq: Four times a day (QID) | ORAL | Status: DC | PRN

## 2011-12-09 MED ORDER — BOOST / RESOURCE BREEZE PO LIQD
1.0000 | Freq: Every day | ORAL | Status: DC
  Administered 2011-12-09 – 2011-12-12 (×2): 1 via ORAL

## 2011-12-09 MED ORDER — ASPIRIN 325 MG PO TBEC: 325.0000 mg | DELAYED_RELEASE_TABLET | Freq: Every day | ORAL | Status: DC

## 2011-12-09 MED ORDER — OXYCODONE HCL 5 MG PO TABS: 5.0000 mg | ORAL_TABLET | ORAL | Status: DC | PRN

## 2011-12-09 MED ORDER — LOSARTAN POTASSIUM 50 MG PO TABS: 50.0000 mg | ORAL_TABLET | Freq: Every day | ORAL | Status: DC

## 2011-12-09 MED ORDER — METOPROLOL TARTRATE 25 MG PO TABS: 25.0000 mg | ORAL_TABLET | Freq: Two times a day (BID) | ORAL | Status: DC

## 2011-12-09 NOTE — Progress Notes (Signed)
INITIAL ADULT NUTRITION ASSESSMENT Date: 12/09/2011   Time: 11:47 AM   INTERVENTION:  Resource Breeze (berry) supplement daily (250 kcals, 9 gm protein per 8 fl oz carton) RD to follow for nutrition care plan  DOCUMENTATION CODES Per approved criteria  -Not Applicable   Reason for Assessment: poor PO intake  ASSESSMENT: Male 66 y.o.  Dx: S/P aortic valve replacement with bioprosthetic valve  Hx:  Past Medical History  Diagnosis Date  . HTN (hypertension)   . GERD (gastroesophageal reflux disease)     bravo pH study 2008  . Hyperlipidemia   . Hemorrhoids     external and internal  . Insomnia   . Allergic rhinitis   . OA (osteoarthritis)   . Hiatal hernia   . BPH (benign prostatic hypertrophy)   . Chronic cough   . Anemia 2009  . Diverticulosis of colon     on colonoscopy 2008  . Heart murmur   . Shortness of breath   . OSA (obstructive sleep apnea)     USES CPAP AS NEEDED  . Headache   . IBS (irritable bowel syndrome)   . Periodontitis     chronic with bone loss  . Gout   . Asthma   . Depression     pt denies  . Cataract   . Hypercholesterolemia   . Chronic interstitial cystitis   . Aortic stenosis   . H/O aortic valve replacement with porcine valve 2006  . Diabetes mellitus     type 2  . S/P aortic valve replacement with bioprosthetic valve 12/06/2011    Redo AVR using 23 mm [facility] Ease pericardial tissue valve    Related Meds:     . acetaminophen  1,000 mg Oral Q6H  . aspirin EC  325 mg Oral Daily  . bisacodyl  10 mg Oral Daily   Or  . bisacodyl  10 mg Rectal Daily  . carbidopa-levodopa  1 tablet Oral BID  . cloNIDine  0.2 mg Oral BID  . docusate sodium  200 mg Oral Daily  . finasteride  5 mg Oral Daily  . furosemide  20 mg Intravenous Q6H  . furosemide  40 mg Oral Daily  . gabapentin  300 mg Oral BID  . insulin aspart  0-15 Units Subcutaneous TID WC  . losartan  50 mg Oral Daily  . metoprolol tartrate  25 mg Oral BID  . midazolam   2 mg Intravenous Once  . pantoprazole  40 mg Oral Daily  . potassium chloride  10 mEq Intravenous Q1 Hr x 3  . potassium chloride  20 mEq Oral Daily  . sodium chloride  3 mL Intravenous Q12H    Ht: 5\' 9"  (175.3 cm)  Wt: 196 lb 6.9 oz (89.1 kg)  Ideal Wt: 72.7 kg % Ideal Wt: 122%  Usual Wt: 201 lb -- per office visit 10/21/11 % Usual Wt: 97%  Body mass index is 29.01 kg/(m^2).  Food/Nutrition Related Hx: no triggers per admission nutrition screen  Labs:  CMP     Component Value Date/Time   NA 132* 12/09/2011 0514   K 3.7 12/09/2011 0514   CL 96 12/09/2011 0514   CO2 27 12/09/2011 0514   GLUCOSE 100* 12/09/2011 0514   BUN 11 12/09/2011 0514   CREATININE 0.61 12/09/2011 0514   CALCIUM 9.0 12/09/2011 0514   PROT 6.3 11/30/2011 0635   ALBUMIN 3.8 11/30/2011 0635   AST 52* 11/30/2011 0635   ALT <5 11/30/2011 8119  ALKPHOS 54 11/30/2011 0635   BILITOT 1.3* 11/30/2011 0635   GFRNONAA >90 12/09/2011 0514   GFRAA >90 12/09/2011 0514     Intake/Output Summary (Last 24 hours) at 12/09/11 1147 Last data filed at 12/08/11 2300  Gross per 24 hour  Intake    482 ml  Output    725 ml  Net   -243 ml    CBG (last 3)   Basename 12/09/11 0630 12/08/11 2147 12/08/11 1619  GLUCAP 100* 114* 133*    Diet Order: Carb Control  Supplements/Tube Feeding: N/A  IVF: N/A  Estimated Nutritional Needs:   Kcal: 2100-2300 Protein: 110-120 gm Fluid: 2.1-2.3 L  Patient s/p redo aortic valve replacement 10/29; reports he's had a poor appetite since his surgery; no % PO intake recorded per flowsheet records; states he ate very little this AM (< 25%); patient at nutrition risk given poor intake, post-op state; would benefit from addition of a nutrition supplement -- amenable to trying berry [employer] supplement -- RD to order.  NUTRITION DIAGNOSIS: -Inadequate oral intake (NI-2.1).  Status: Ongoing  RELATED TO: poor appetite  AS EVIDENCE BY: patient  report  MONITORING/EVALUATION(Goals): Goal: Oral intake with meals & supplements to meet >/= 90% of estimated nutrition needs Monitor: PO & supplemental intake, weight, labs, I/O's  EDUCATION NEEDS: -No education needs identified at this time  Kirkland Hun, RD, LDN Pager #: [phone] After-Hours Pager #: [phone]

## 2011-12-09 NOTE — Progress Notes (Signed)
CARDIAC REHAB PHASE I   PRE:  Rate/Rhythm: 71SR  BP:  Supine: 108/76  Sitting:   Standing:    SaO2: 93%RA  MODE:  Ambulation: 350 ft   POST:  Rate/Rhythem: 79SR  BP:  Supine:   Sitting: 129/66  Standing:    SaO2: 98%RA 1025-1127 Pt walked 350 ft on RA with rolling walker and asst x 1 with steady gait. Tolerated well. Pt having gas pains. Education completed with pt and wife . Permission given to refer to [facility] Phase 2. Pt would like rolling walker for home use. To bed after walk.  Duanne Limerick

## 2011-12-09 NOTE — Progress Notes (Signed)
Pacing wires dc'ed per unit protocol pt. tolerated well 

## 2011-12-09 NOTE — Discharge Summary (Addendum)
Physician Discharge Summary  Patient ID: KAHLEEL FADELEY MRN: [id number] DOB/AGE: 10/01/1945 66 y.o.  Admit date: 11/28/2011 Discharge date: 12/09/2011  Admission Diagnoses:  Patient Active Problem List  Diagnosis  . HYPERLIPIDEMIA-MIXED  . OBSTRUCTIVE SLEEP APNEA  . HYPERTENSION  . ALLERGIC  RHINITIS  . GERD  . OSTEOARTHRITIS  . INSOMNIA  . BENIGN PROSTATIC HYPERTROPHY, HX OF  . AORTIC VALVE REPLACEMENT, HX OF  . Cough syncope  . Syncope  . Iron deficiency anemia, unspecified  . IBS (irritable bowel syndrome)  . Aortic stenosis  . Chronic daily headache   Discharge Diagnoses:   Patient Active Problem List  Diagnosis  . HYPERLIPIDEMIA-MIXED  . OBSTRUCTIVE SLEEP APNEA  . HYPERTENSION  . ALLERGIC  RHINITIS  . GERD  . OSTEOARTHRITIS  . INSOMNIA  . BENIGN PROSTATIC HYPERTROPHY, HX OF  . AORTIC VALVE REPLACEMENT, HX OF  . Cough syncope  . Syncope  . Iron deficiency anemia, unspecified  . IBS (irritable bowel syndrome)  . Aortic stenosis  . Chronic daily headache  . S/P aortic valve replacement with bioprosthetic valve   Discharged Condition: good  History of Present Illness:   [name] is a 66 yo white male with known history of Aortic Stenosis.  He is S/P Aortic Valve Replacement performed in 2006 utilizing a 25 mm Edwards Perimount Bovine Pericardial Tissue Valve with concomitant supracoronary resection and grafting of an ascending aortic aneurysm.  The patient did well following his surgery and has been routinely followed by Dr. Jens Som.  However in July the patient suffered a syncopal episode which was associated with an episode of coffee ground emesis.  He was noted to have iron deficiency anemia at the time of hospital admission.  He was transfused reds cells and underwent Gastroenterology evaluation with Endoscopy and Colonoscopy.  There was no source identified for an acute GI bleed.  His hemoglobin has remained stable since discharge at 8.5.  He has been set  up for possible capsule endoscopy.  On 11/28/2011 the patient was awoken from sleep with a headache.  He got up and watched some television and on his way back to bed he suffered another syncopal episode and according to the patient's wife was unresponsive for several minutes.  Also during this he suffered from urinary incontinence.  He was awoken brought to the Emergency Department for evaluation.  EKG obtained revealed NSR with negative cardiac enzymes.  Echocardiogram was obtained and showed severe aortic stenosis with preserved LV function.  The patient was admitted to the hospital for further workup.    Hospital Course:   Upon admission Cardiac surgery consult was requested.  The patient was evaluated by Dr. Cornelius Moras on HD #1 at which time the patient admitted to approximately a 1 year history of exertional shortness of breath.  At that time it was felt the patient wound benefit from cardiac catheterization and further imaging to assess the patients aorta.  It was also felt the patient would require dental consultation and close monitoring for signs of GI bleeding.  HD #3 the patient underwent cardiac catheterization which did not reveal evidence of coronary artery disease.  He was also evaluated by Dentistry, and the patient wished to have outpatient dental care after his heart surgery.  HD #5 the patient was again evaluated by Dr. Cornelius Moras who was agreeable to proceeding with surgery.  The risks and benefits of the procedure were explained to the patient and he was agreeable to proceed with surgery.  The patient was  taken to the operating room on 12/06/2011.  He underwent Redo Aortic Valve Replacement with a 23 mm Edwards Magna Ease Pericardial Tissue Valve.  The patient tolerated the procedure well and was taken to the SICU in stable condition.  POD #0 the patient was extubated.  POD #1 the patient was weaned off nitroglycerin as tolerated.  His arterial line was removed.  He was hypertensive and was restarted  on his home Clonidine and Finasteride.  His beta blocker was also increased.  POD #2 patient with post operative anemia.  He was transfused 1 unit of packed red cells.  He was medically stable and transferred to the step down unit in stable condition.  POD #3 patient is maintaining NSR.  He is mildly febrile this morning with no Leukocytosis present.  This is likely due to atelectasis.  His hemoglobin is stable at 8.2  His external pacing wires were removed today.  He had complaints of intermittent lower abdominal pain. He also had loose stools for a couple of days as well. C Dif was ordered, but he had no more stools.Amylase and LFTs were within normal limits. US of the abdomen showed sludge in the gall bladder, but no gallstones, pericholecystic fluid, or gall bladder wall thickening. He has been eating (although does not like the taste of the food much)and denied nausea or emesis. His last WBC was within normal limits and he has had one low grade fever to 99, but has otherwise been afebrile.He does have a history of IBS-questionable etiology. Currently, he is feeling better. His abdomen is not distended and is non tender. He is anxious to go home. He was seen and evaluated by Dr. Laneta Simmers. He will be discharged today.  He will need to follow up with Dr. Cornelius Moras on 01/02/2012.  He will also need to set up a follow up appointment with his Cardiologist office in 2 weeks time.   Significant Diagnostic Studies:   [facility]* *[facility]* [address] [place], [place] [address] [phone]  ------------------------------------------------------------ Transthoracic Echocardiography  Patient: Carter, Kassel MR #: [id number] Study Date: 11/29/2011 Gender: M Age: 32 Height: 175.3cm Weight: 83.8kg BSA: 2.102m^2 Pt. Status: Room: 4742  PERFORMING [facility] Ginnie Smart SONOGRAPHER Cathie Beams cc:  ------------------------------------------------------------ LV EF: 60% - 65%  ------------------------------------------------------------ Indications: Previous study 02/10/2009. Syncope 780.2.  ------------------------------------------------------------ History: PMH: Obstructive sleep apnea. Risk factors: Hypertension. Dyslipidemia.  ------------------------------------------------------------ Study Conclusions  - Left ventricle: Wall thickness was increased in a pattern of moderate LVH. There was moderate concentric hypertrophy. Systolic function was normal. The estimated ejection fraction was in the range of 60% to 65%. Wall motion was normal; there were no regional wall motion abnormalities. Doppler parameters are consistent with abnormal left ventricular relaxation (grade 1 diastolic dysfunction). - Aortic valve: Trivial regurgitation. Valve area: 0.65cm^2(VTI). Valve area: 0.59cm^2 (Vmax). Valve area: 0.55cm^2 (Vmean).  Cardiac Catheterization:  Central aortic pressure: 111/64  Left ventricular pressure: 214/11/21  RA: 6  RV: 36/5/10  PA: 37/8 (mean 23)  PCWP: 15  AO: 96%  PA: 71%  CO: 8.9 L/min (by Fick)  CI: 4.5 L/min/m2 (by Fick  AVA: 0.94 cm2  Angiographic Findings:  Left main: No obstructive disease.  Left Anterior Descending Artery: Large caliber vessel that courses to the apex. No significant diagonal branches. No obstructive disease.  Circumflex Artery: Large caliber vessel with no obstructive disease. Moderate caliber intermediate branch with no obstructive disease.  Right Coronary Artery: Large dominant vessel  with no obstructive disease.   Treatments: surgery:   Redo Aortic Valve Replacement Edwards Magna Ease Pericardial Tissue Valve (size 23 mm, model # 3300TFX, serial # [id number])  The patient has been discharged on:   1.Beta Blocker:  Yes [  x ]                              No   [   ]                              If No,  reason:  2.Ace Inhibitor/ARB: Yes [ x  ]                                     No  [    ]                                     If No, reason: Labile Blood pressure, preserved EF  3.Statin:   Yes [   ]                  No  [x   ]                  If No, reason:Normal Cholesterol, No CAD  4.Marlowe KaysCarlyle Basques   ]                  No   [   ]                  If No, reason:     Disposition: 01-Home or Self Care  Discharge Orders    Future Appointments: Provider: Department: Dept Phone: Center:   01/02/2012 12:30 PM Purcell Nails, MD [facility] [phone] [facility]     Future Orders Please Complete By Expires   Amb Referral to Cardiac Rehabilitation           Medication List     As of 12/13/2011  9:30 AM    STOP taking these medications         aspirin 81 MG tablet      CATAPRES 0.2 MG tablet   Generic drug: cloNIDine      hydrochlorothiazide 12.5 MG capsule   Commonly known as: MICROZIDE      TAKE these medications         acetaminophen 500 MG tablet   Commonly known as: TYLENOL   Take 500 mg by mouth every 6 (six) hours as needed. For pain      aspirin 325 MG EC tablet   Take 1 tablet (325 mg total) by mouth daily.      carbidopa-levodopa 25-100 MG per tablet   Commonly known as: SINEMET IR   Take 1 tablet by mouth 2 (two) times daily.      docusate sodium 100 MG capsule   Commonly known as: COLACE   Take 100 mg by mouth as needed. constipation      finasteride 5 MG tablet   Commonly known as: PROSCAR   Take 5 mg by mouth daily.      fish oil-omega-3 fatty acids 1000 MG capsule   Take 1 g by mouth 2 (two)  times daily.      furosemide 40 MG tablet   Commonly known as: LASIX   Take 1 tablet (40 mg total) by mouth daily. For one week then stop.      gabapentin 300 MG capsule   Commonly known as: NEURONTIN   Take 300 mg by mouth 2 (two) times daily.      losartan 50 MG tablet   Commonly known as: COZAAR   Take 1 tablet (50 mg total) by mouth  daily.      metFORMIN 500 MG tablet   Commonly known as: GLUCOPHAGE   Take 500 mg by mouth 2 (two) times daily with a meal.      metoprolol tartrate 25 MG tablet   Commonly known as: LOPRESSOR   Take 0.5 tablets (12.5 mg total) by mouth 2 (two) times daily.      pantoprazole 40 MG tablet   Commonly known as: PROTONIX   Take 40 mg by mouth daily.      potassium chloride SA 20 MEQ tablet   Commonly known as: K-DUR,KLOR-CON   Take 1 tablet (20 mEq total) by mouth daily. For one week then stop.      traMADol 50 MG tablet   Commonly known as: ULTRAM   Take 1-2 tablets (50-100 mg total) by mouth every 6 (six) hours as needed.         SignedLowella Dandy 12/09/2011, 2:13 PM

## 2011-12-09 NOTE — Progress Notes (Signed)
Subjective:  The patient is now on 2000.  He is doing well.  Rhythm remains normal sinus rhythm.  The patient has not been experiencing any unusual chest pain.  Tolerating walking to the bathroom without unusual dyspnea.  Objective:  Vital Signs in the last 24 hours: Temp:  [97.1 F (36.2 C)-100.2 F (37.9 C)] 100.2 F (37.9 C) (11/01 0816) Pulse Rate:  [64-131] 83  (11/01 0816) Resp:  [12-23] 23  (11/01 0816) BP: (85-143)/(50-87) 93/59 mmHg (11/01 0816) SpO2:  [94 %-100 %] 98 % (11/01 0816) Weight:  [196 lb 6.9 oz (89.1 kg)] 196 lb 6.9 oz (89.1 kg) (11/01 0443)  Intake/Output from previous day: 10/31 0701 - 11/01 0700 In: 1146.5 [P.O.:630; Blood:362.5; IV Piggyback:154] Out: 1815 [Urine:1735; Chest Tube:80] Intake/Output from this shift:       . acetaminophen  1,000 mg Oral Q6H  . aspirin EC  325 mg Oral Daily  . bisacodyl  10 mg Oral Daily   Or  . bisacodyl  10 mg Rectal Daily  . carbidopa-levodopa  1 tablet Oral BID  . cloNIDine  0.2 mg Oral BID  . docusate sodium  200 mg Oral Daily  . finasteride  5 mg Oral Daily  . furosemide  20 mg Intravenous Q6H  . furosemide  40 mg Oral Daily  . gabapentin  300 mg Oral BID  . insulin aspart  0-15 Units Subcutaneous TID WC  . losartan  50 mg Oral Daily  . metoprolol tartrate  25 mg Oral BID  . midazolam  2 mg Intravenous Once  . moving right along book   Does not apply Once  . pantoprazole  40 mg Oral Daily  . potassium chloride  10 mEq Intravenous Q1 Hr x 3  . potassium chloride  20 mEq Oral Daily  . sodium chloride  3 mL Intravenous Q12H      Physical Exam: The patient appears to be in no distress.  Head and neck exam reveals that the pupils are equal and reactive.  The extraocular movements are full.  There is no scleral icterus.  Mouth and pharynx are benign.  No lymphadenopathy.  No carotid bruits.  The jugular venous pressure is normal.  Thyroid is not enlarged or tender.  Chest reveals decreased breath sounds at  the bases.  Heart reveals no abnormal lift or heave.  First and second heart sounds are normal.  There is no  gallop rub or click.  There is a faint systolic ejection murmur across the prosthetic aortic valve  The abdomen is soft and nontender.  Bowel sounds are normoactive.  There is no hepatosplenomegaly or mass.  There are no abdominal bruits.  Extremities reveal mild edema bilaterally.  Pedal pulses are good.  There is no cyanosis or clubbing.  Neurologic exam is normal strength and no lateralizing weakness.  No sensory deficits.  Integument reveals no rash  Lab Results:  Basename 12/09/11 0514 12/08/11 0340  WBC 7.9 7.3  HGB 8.2* 7.0*  PLT 176 152    Basename 12/09/11 0514 12/08/11 0340  NA 132* 130*  K 3.7 3.9  CL 96 97  CO2 27 28  GLUCOSE 100* 97  BUN 11 10  CREATININE 0.61 0.67   No results found for this basename: TROPONINI:2,CK,MB:2 in the last 72 hours Hepatic Function Panel No results found for this basename: PROT,ALBUMIN,AST,ALT,ALKPHOS,BILITOT,BILIDIR,IBILI in the last 72 hours No results found for this basename: CHOL in the last 72 hours No results found for this basename:  PROTIME in the last 72 hours  Imaging: Dg Chest 2 View  12/09/2011  *RADIOLOGY REPORT*  Clinical Data: 66 year old male status post CABG with shortness of breath and atelectasis.  CHEST - 2 VIEW  Comparison: 12/08/2011 and earlier.  Findings: Chest tubes and right IJ introducer sheath have been removed.  Epicardial pacer wires remain in place.  Lung volumes remain slightly lower than baseline.  No pneumothorax. Trace pleural effusions.  No pulmonary edema.  Mild bibasilar atelectasis.  Stable cardiac size and mediastinal contours. Moderate gaseous distention of bowel in the abdomen. Stable visualized osseous structures.  IMPRESSION: 1.  Chest tubes and right IJ catheter removed.  No pneumothorax. 2.  Mildly lower lung volumes than the preoperative study with trace effusions and mild bibasilar  atelectasis.   Original Report Authenticated By: Erskine Speed, M.D.    Dg Chest Portable 1 View In Am  12/08/2011  *RADIOLOGY REPORT*  Clinical Data: Postop cardiac surgery.  PORTABLE CHEST - 1 VIEW  Comparison: 12/07/2011  Findings: There are three chest drains.  Surgical changes consistent with an aortic valve replacement.  There are low lung volumes without pneumothorax.  Right jugular central venous catheter is present.  Heart size is stable. Swan-Ganz catheter has been removed.  IMPRESSION: Persistent low lung volumes.  Stable position of the chest drains without a pneumothorax.   Original Report Authenticated By: Richarda Overlie, M.D.     Cardiac Studies: Telemetry shows normal sinus rhythm Assessment/Plan:  Patient Active Hospital Problem List: S/P aortic valve replacement with bioprosthetic valve (12/06/2011)   Assessment: Doing well.     Plan: Continue to mobilize      LOS: 11 days    Cassell Clement 12/09/2011, 8:55 AM

## 2011-12-09 NOTE — Progress Notes (Addendum)
3 Days Post-Op Procedure(s) (LRB): REDO AORTIC VALVE REPLACEMENT (AVR) (N/A) Subjective:  [name] states he is ready to get out of here.  He has no other complaints.  He states he has not ambulated much.  +BM  Objective: Vital signs in last 24 hours: Temp:  [97.1 F (36.2 C)-100.2 F (37.9 C)] 100.2 F (37.9 C) (11/01 0816) Pulse Rate:  [64-131] 83  (11/01 0816) Cardiac Rhythm:  [-] Normal sinus rhythm (11/01 0816) Resp:  [12-23] 23  (11/01 0816) BP: (85-143)/(50-87) 93/59 mmHg (11/01 0816) SpO2:  [94 %-100 %] 98 % (11/01 0816) Weight:  [196 lb 6.9 oz (89.1 kg)] 196 lb 6.9 oz (89.1 kg) (11/01 0443)  Intake/Output from previous day: 10/31 0701 - 11/01 0700 In: 1146.5 [P.O.:630; Blood:362.5; IV Piggyback:154] Out: 1815 [Urine:1735; Chest Tube:80]  General appearance: alert, cooperative and no distress Heart: regular rate and rhythm and systolic murmur: early systolic 3/6, blowing  Lungs: clear to auscultation bilaterally Abdomen: soft, non-tender; bowel sounds normal; no masses,  no organomegaly Extremities: edema trace Wound: clean and dry  Lab Results:  [facility] 12/09/11 0514 12/08/11 0340  WBC 7.9 7.3  HGB 8.2* 7.0*  HCT 25.9* 22.0*  PLT 176 152   BMET:  Basename 12/09/11 0514 12/08/11 0340  NA 132* 130*  K 3.7 3.9  CL 96 97  CO2 27 28  GLUCOSE 100* 97  BUN 11 10  CREATININE 0.61 0.67  CALCIUM 9.0 8.6    PT/INR:  Basename 12/06/11 1530  LABPROT 17.9*  INR 1.52*   ABG    Component Value Date/Time   PHART 7.329* 12/06/2011 2017   HCO3 23.1 12/06/2011 2017   TCO2 25 12/07/2011 1637   ACIDBASEDEF 3.0* 12/06/2011 2017   O2SAT 88.0 12/06/2011 2017   CBG (last 3)   Basename 12/09/11 0630 12/08/11 2147 12/08/11 1619  GLUCAP 100* 114* 133*    Assessment/Plan: S/P Procedure(s) (LRB): REDO AORTIC VALVE REPLACEMENT (AVR) (N/A)  1. CV- NSR rate and pressure controlled, on Lopressor and Cozaar 2. Pulm- off oxygen continue IS- CXR no pneumothorax,  effusion 3. Febrile- no leukocytosis, likely SIR/Atelectasis will follow 4. LOC Constipation- patient states having bowel movement, last one primarily liquid.+ gaseous distention of bowel seen on CXR- on colace, dulcolax prn 5. Acute post operative anemia- transfused yesterday, Hgb 8.2 today, will repeat in AM 6. DM- CBGs controlled for now, will restart Metformin once PO intake improves 7. Dispo- will d/c EPW this morning, patient making progress likely d/c in next 24-48 hours  LOS: 11 days    BARRETT, ERIN 12/09/2011   Feels more nauseated this afternoon with mild abdominal discomfort.  Abdominal exam remains benign.  Will check KUB in am tomorrow.  BP also running a bit lower than typical.  I am doubtful that he will be ready for d/c home tomorrow, but perhaps Sunday if he improves.  Overall doing fairly well.  Jonel Weldon H 12/09/2011 4:41 PM

## 2011-12-09 NOTE — Discharge Summary (Signed)
I agree with the above discharge summary and plan for follow-up.  Gedeon Brandow H  

## 2011-12-10 ENCOUNTER — Inpatient Hospital Stay (HOSPITAL_COMMUNITY): Payer: Medicare Other

## 2011-12-10 DIAGNOSIS — J9819 Other pulmonary collapse: Secondary | ICD-10-CM | POA: Diagnosis not present

## 2011-12-10 MED ORDER — SODIUM CHLORIDE 0.9 % IV BOLUS (SEPSIS)
250.0000 mL | Freq: Once | INTRAVENOUS | Status: AC
  Administered 2011-12-10: 250 mL via INTRAVENOUS

## 2011-12-10 MED ORDER — METOPROLOL TARTRATE 12.5 MG HALF TABLET
12.5000 mg | ORAL_TABLET | Freq: Two times a day (BID) | ORAL | Status: DC
  Administered 2011-12-10 – 2011-12-13 (×6): 12.5 mg via ORAL
  Filled 2011-12-10 (×7): qty 1

## 2011-12-10 NOTE — Progress Notes (Signed)
Pt ambulated in hallway 550 ft with rolling walker and wife. Pt tolerated activity well. Will continue to monitor.  

## 2011-12-10 NOTE — Progress Notes (Signed)
Pt ambulated in hallway 550 ft with rolling walker and tolerated activity well. Will continue to monitor.  

## 2011-12-10 NOTE — Progress Notes (Signed)
Pt stated he "felt bad" and was "very sleepy" . VS taken and BP was 82/48 manually.Pa Notified and orders given and activated.  Will continue to monitor.

## 2011-12-10 NOTE — Progress Notes (Signed)
CARDIAC REHAB NOTE  Patient has already ambulated several times with his wife. Patient plans to do his third walk tonight with staff. Patient is doing well and his wife is very encouraging. Patient hopes to go home in the morning and will continue his walks tomorrow if not. Cardiac rehab staff checked in on patient 2 times (morning and lunch time). We will continue to follow patient if needed on Monday.

## 2011-12-10 NOTE — Progress Notes (Addendum)
                    [address]            [place] [address]          [phone]     4 Days Post-Op Procedure(s) (LRB): REDO AORTIC VALVE REPLACEMENT (AVR) (N/A)  Subjective: Still having abdominal discomfort, but is passing flatus and having some loose stools.  Appetite ok. No nausea.     Objective: Vital signs in last 24 hours: Patient Vitals for the past 24 hrs:  BP Temp Temp src Pulse Resp SpO2 Weight  12/10/11 0637 114/65 mmHg 98.6 F (37 C) Oral 66  18  92 % 190 lb 9.6 oz (86.456 kg)  12/09/11 2022 118/73 mmHg 98.6 F (37 C) Oral 75  18  94 % -  12/09/11 1750 98/67 mmHg - - 68  - - -  12/09/11 1700 101/60 mmHg - - 71  - - -  12/09/11 1555 97/60 mmHg - - 69  - - -  12/09/11 1534 98/55 mmHg - - 66  - - -  12/09/11 1518 92/76 mmHg - - 66  - - -  12/09/11 1500 108/67 mmHg - - 76  - - -  12/09/11 1300 90/50 mmHg 98.7 F (37.1 C) Oral 69  18  92 % -   Current Weight  12/10/11 190 lb 9.6 oz (86.456 kg)   Pre-op wt= 81.1 kg  Intake/Output from previous day: 11/01 0701 - 11/02 0700 In: 540 [P.O.:540] Out: -   CBGs 100-113-98  PHYSICAL EXAM:  Heart: RRR, +systolic murmur Lungs: Clear Wound: Clean and dry Extremities: Trace LE edema Abdomen: soft, NT/ND, +BS    Lab Results: CBC: Basename 12/09/11 0514 12/08/11 0340  WBC 7.9 7.3  HGB 8.2* 7.0*  HCT 25.9* 22.0*  PLT 176 152   BMET:  Basename 12/09/11 0514 12/08/11 0340  NA 132* 130*  K 3.7 3.9  CL 96 97  CO2 27 28  GLUCOSE 100* 97  BUN 11 10  CREATININE 0.61 0.67  CALCIUM 9.0 8.6    PT/INR: No results found for this basename: LABPROT,INR in the last 72 hours  KUB: Findings: Medial sternotomy changes noted for aortic valve  replacement. Stable cardiomegaly and mild vascular congestion.  Low lung volumes evident with basilar atelectasis. No significant  or enlarging effusion. No pneumothorax. Trachea is midline.  No free air evident. Scattered air and stool throughout the bowel.   Mild distention of the transverse colon and left descending colon.  Mild ileus not excluded. No abnormal calcifications.  IMPRESSION:  Stable postoperative appearance of the chest.  No free air or obstruction.  Mild gaseous distention of bowel, suspect residual ileus.    Assessment/Plan: S/P Procedure(s) (LRB): REDO AORTIC VALVE REPLACEMENT (AVR) (N/A) CV- still running low SBPs.  On Cozaar, Lopressor.  May need to consider decreasing doses. GI- abdominal exam negative, AXR shows residual ileus.  Continue to monitor. DM- CBGs stable. Resume po meds when eating well. Continue IS/pulm toilet.   Home once GI issues improved.  ? In am.  LOS: 12 days    COLLINS,GINA H 12/10/2011   no tenderness over abdomen, pat has gallbladder but not tender, ileus on xray I have seen and examined [name] and agree with the above assessment  and plan.  Delight Ovens MD Beeper [phone] Office [phone] 12/10/2011 11:27 AM

## 2011-12-11 ENCOUNTER — Inpatient Hospital Stay (HOSPITAL_COMMUNITY): Payer: Medicare Other

## 2011-12-11 DIAGNOSIS — K829 Disease of gallbladder, unspecified: Secondary | ICD-10-CM | POA: Diagnosis not present

## 2011-12-11 LAB — COMPREHENSIVE METABOLIC PANEL
ALT: 12 U/L (ref 0–53)
Alkaline Phosphatase: 61 U/L (ref 39–117)
BUN: 9 mg/dL (ref 6–23)
CO2: 29 mEq/L (ref 19–32)
Chloride: 98 mEq/L (ref 96–112)
GFR calc Af Amer: >90 mL/min (ref >90)
Glucose, Bld: 132 mg/dL — ABNORMAL HIGH (ref 70–99)
Potassium: 3.4 mEq/L — ABNORMAL LOW (ref 3.5–5.1)
Sodium: 134 mEq/L — ABNORMAL LOW (ref 135–145)
Total Bilirubin: 0.6 mg/dL (ref 0.3–1.2)

## 2011-12-11 LAB — GLUCOSE, CAPILLARY: Glucose-Capillary: 111 mg/dL — ABNORMAL HIGH (ref 70–99)

## 2011-12-11 LAB — AMYLASE: Amylase: 26 U/L (ref 0–105)

## 2011-12-11 MED ORDER — LOSARTAN POTASSIUM 25 MG PO TABS
25.0000 mg | ORAL_TABLET | Freq: Every day | ORAL | Status: DC
  Administered 2011-12-11 – 2011-12-12 (×2): 25 mg via ORAL
  Filled 2011-12-11 (×3): qty 1

## 2011-12-11 MED ORDER — BISACODYL 10 MG RE SUPP
10.0000 mg | Freq: Once | RECTAL | Status: AC
  Administered 2011-12-11: 10 mg via RECTAL

## 2011-12-11 NOTE — Progress Notes (Signed)
12/11/2011 1600 NCM spoke to pt and gave permission to speak with wife, Dennie Bible. States he did receive RW to his room. No additional DME requested. Provided pt with [facility] list for possible d/c home with [facility] RN to follow up post hospital d/c. Isidoro Donning RN CCM Case Mgmt phone [phone]

## 2011-12-11 NOTE — Progress Notes (Addendum)
                    [address]            [place] [address]          [phone]     5 Days Post-Op Procedure(s) (LRB): REDO AORTIC VALVE REPLACEMENT (AVR) (N/A)  Subjective: Just back from walking in halls. Feels weak, SOB, c/o increased swelling.  Still with loose, watery stools and abdominal discomfort.   Objective: Vital signs in last 24 hours: Patient Vitals for the past 24 hrs:  BP Temp Temp src Pulse Resp SpO2 Weight  12/11/11 0604 158/76 mmHg 97.9 F (36.6 C) Oral 80  18  99 % 196 lb 4.8 oz (89.041 kg)  12/10/11 2047 149/71 mmHg 98.6 F (37 C) Oral 70  18  98 % -  12/10/11 1619 119/69 mmHg - - 67  18  97 % -  12/10/11 1500 125/63 mmHg - - 70  - 94 % -  12/10/11 1449 114/65 mmHg - - 66  18  - -  12/10/11 1432 97/61 mmHg - - 67  18  97 % -  12/10/11 1417 100/59 mmHg - - 68  18  98 % -  12/10/11 1411 98/59 mmHg - - 68  18  98 % -  12/10/11 1407 96/54 mmHg - - 68  18  97 % -  12/10/11 1400 96/55 mmHg - - 69  18  - -  12/10/11 1332 82/48 mmHg - - 70  18  95 % -  12/10/11 1330 84/41 mmHg 98.4 F (36.9 C) - 67  18  93 % -  12/10/11 1100 120/76 mmHg - - - - - -  12/10/11 1057 117/72 mmHg - - 80  - - -   Current Weight  12/11/11 196 lb 4.8 oz (89.041 kg)   Pre-op wt= 81.1 kg   Intake/Output from previous day: 11/02 0701 - 11/03 0700 In: 1220 [P.O.:720; IV Piggyback:500] Out: -   CBGs 159-114-101  PHYSICAL EXAM:  Heart: RRR, +systolic murmur Lungs: decreased BS in bases bilaterally Wound: Clean and dry Extremities: + LE edema/pedal edema Abdomen: soft, diffuse tenderness to palpation, not distended, +BS    Lab Results: CBC: Basename 12/09/11 0514  WBC 7.9  HGB 8.2*  HCT 25.9*  PLT 176   BMET:  Basename 12/09/11 0514  NA 132*  K 3.7  CL 96  CO2 27  GLUCOSE 100*  BUN 11  CREATININE 0.61  CALCIUM 9.0    PT/INR: No results found for this basename: LABPROT,INR in the last 72 hours    Assessment/Plan: S/P Procedure(s)  (LRB): REDO AORTIC VALVE REPLACEMENT (AVR) (N/A) CV- BPs dropped into the 80s yesterday requiring multiple fluid boluses and meds were held.  Now, BPs trending up.  Will restart Cozaar at low dose and monitor closely. GI- probable resolving ileus.  Passing flatus and some watery stools, but no formed stools yet, and appears more tender on exam today.  Will check CMET, amylase, abdominal u/s and watch. DM- stable.  Resume po meds once eating better. Vol overload- continue diuresis.   LOS: 13 days    Ray,[name] 12/11/2011   vague abdominal com[plaints, will check [place] of gb I have seen and examined [name] and agree with the above assessment  and plan.  Danny Ovens MD Beeper [phone] Office [phone] 12/11/2011 10:57 AM

## 2011-12-11 NOTE — Progress Notes (Signed)
Pt ambulated in hallway 500 ft with rolling walker and tolerated activity well. Will continue to monitor.  

## 2011-12-12 LAB — GLUCOSE, CAPILLARY: Glucose-Capillary: 93 mg/dL (ref 70–99)

## 2011-12-12 NOTE — Progress Notes (Signed)
CARDIAC REHAB PHASE I   PRE:  Rate/Rhythm: 79SR PVC  BP:  Supine: 170/98 left arm, 160/94 right arm  Sitting:   Standing:    SaO2: 94%RA  MODE:  Ambulation: 550 ft   POST:  Rate/Rhythem: 92  BP:  Supine:   Sitting: 170/98  Standing:    SaO2: 98%RA 1100-1123 Pt walked 550 ft with rolling walker on RA.  Sats good on RA. BP elevated. Notified pt's RN of elevated BP. To sitting on side of bed after walk.  Duanne Limerick

## 2011-12-12 NOTE — Progress Notes (Addendum)
                   [address]            [place] [address]          [phone]      6 Days Post-Op Procedure(s) (LRB): REDO AORTIC VALVE REPLACEMENT (AVR) (N/A)  Subjective: Patient with complaints of intermittent lower abdominal discomfort. He denies nausea or emesis. He states he is eating fairly well. Has had loose stools.  Objective: Vital signs in last 24 hours: Temp:  [98 F (36.7 C)-99.6 F (37.6 C)] 98.9 F (37.2 C) (11/04 0531) Pulse Rate:  [77-90] 83  (11/04 0531) Cardiac Rhythm:  [-] Normal sinus rhythm (11/03 2006) Resp:  [18-19] 19  (11/04 0531) BP: (150-165)/(74-89) 165/89 mmHg (11/04 0531) SpO2:  [93 %-97 %] 95 % (11/04 0531) Weight:  [191 lb 6.4 oz (86.818 kg)] 191 lb 6.4 oz (86.818 kg) (11/04 0531)  Pre op weight  81.1 kg Current Weight  12/12/11 191 lb 6.4 oz (86.818 kg)     Intake/Output from previous day: 11/03 0701 - 11/04 0700 In: 480 [P.O.:480] Out: 4 [Urine:4]   Physical Exam:  Cardiovascular: RRR; systolic murmur Pulmonary: Diminished breath sounds at bases; no rales, wheezes, or rhonchi. Abdomen: Soft, mild distention, bowel sounds present. Extremities: Mild bilateral lower extremity edema. Wound: Clean and dry.  No erythema or signs of infection.  Lab Results: CBC:No results found for this basename: WBC:2,HGB:2,HCT:2,PLT:2 in the last 72 hours BMET:  Basename 12/11/11 1000  NA 134*  K 3.4*  CL 98  CO2 29  GLUCOSE 132*  BUN 9  CREATININE 0.56  CALCIUM 9.7    PT/INR:  Lab Results  Component Value Date   INR 1.52* 12/06/2011   INR 1.22 11/28/2011   INR 1.00 01/05/2009   ABG:  INR: Will add last result for INR, ABG once components are confirmed Will add last 4 CBG results once components are confirmed  Assessment/Plan:  1. CV - Had a brief run of SVT around 11:29 pm.Maintaining SR since then. On Lopressor 12.5 bid, Cozaar 25 daily. Will increase Lopressor to 25 bid for better bp control. 2.  Pulmonary  - Encourage incentive spirometer 3. Volume Overload - Continue with diuresis. 4.  Acute blood loss anemia - Last H and H 8.2 and 25.9. 5.GI-US of abdomen showed sludge in gallbladder, but no stones, pericholecystic fluid, or gb wall thickening. Amylase and LFTs within normal limits.He has a history of IBS as well as diverticulosis. Questionable if flare up of IBS. Will discuss with surgeon if needs CT. 6.DM-CBGs 74/111/93. Not on Metformin yet as previously not taking much po.  ZIMMERMAN,DONIELLE MPA-C 12/12/2011,6:50 AM    Patient seen and examined and I agree with above.  His abdomen is mildly distended but soft and nontender. He says he had 2 liquid stools this AM, will check c diff.

## 2011-12-13 LAB — GLUCOSE, CAPILLARY: Glucose-Capillary: 103 mg/dL — ABNORMAL HIGH (ref 70–99)

## 2011-12-13 MED ORDER — METOPROLOL TARTRATE 25 MG PO TABS: 12.5000 mg | ORAL_TABLET | Freq: Two times a day (BID) | ORAL | Status: DC

## 2011-12-13 MED ORDER — FUROSEMIDE 40 MG PO TABS: 40.0000 mg | ORAL_TABLET | Freq: Every day | ORAL | Status: DC

## 2011-12-13 MED ORDER — LOSARTAN POTASSIUM 50 MG PO TABS
50.0000 mg | ORAL_TABLET | Freq: Every day | ORAL | Status: DC
  Administered 2011-12-13: 50 mg via ORAL
  Filled 2011-12-13: qty 1

## 2011-12-13 MED ORDER — LOSARTAN POTASSIUM 50 MG PO TABS: 50.0000 mg | ORAL_TABLET | Freq: Every day | ORAL | Status: DC

## 2011-12-13 MED ORDER — POTASSIUM CHLORIDE CRYS ER 20 MEQ PO TBCR: 20.0000 meq | EXTENDED_RELEASE_TABLET | Freq: Every day | ORAL | Status: DC

## 2011-12-13 MED ORDER — ASPIRIN 325 MG PO TBEC: 325.0000 mg | DELAYED_RELEASE_TABLET | Freq: Every day | ORAL | Status: DC

## 2011-12-13 NOTE — Progress Notes (Signed)
CARDIAC REHAB PHASE I   PRE:  Rate/Rhythm: 82 SR  BP:  Supine:   Sitting: 146/80  Standing:    SaO2: 97 RA  MODE:  Ambulation: 890 ft   POST:  Rate/Rhythem: 96  BP:  Supine:   Sitting: 150/84  Standing:    SaO2: 95 RA 1125-1150 Assisted X 1 and used walker to ambulate. Gait steady with walker. VS stable. Pt's BP better. Pt back to side of bed after walk with call light in reach and wife present.  Beatrix Fetters

## 2011-12-13 NOTE — Progress Notes (Addendum)
                   [address]            [place] [address]          [phone]      7 Days Post-Op Procedure(s) (LRB): REDO AORTIC VALVE REPLACEMENT (AVR) (N/A)  Subjective: Patient still with complaints of intermittent lower abdominal discomfort. He denies nausea or emesis. He did not each much yesterday because "food did not taste good". No loose stools yesterday.  Objective: Vital signs in last 24 hours: Temp:  [97.4 F (36.3 C)-99.2 F (37.3 C)] 98 F (36.7 C) (11/05 0418) Pulse Rate:  [73-90] 73  (11/05 0418) Cardiac Rhythm:  [-] Normal sinus rhythm (11/04 1929) Resp:  [16-18] 16  (11/05 0418) BP: (155-178)/(83-92) 178/83 mmHg (11/05 0418) SpO2:  [95 %-100 %] 95 % (11/05 0418) Weight:  [185 lb 11.2 oz (84.233 kg)] 185 lb 11.2 oz (84.233 kg) (11/05 0418)  Pre op weight  81.1 kg Current Weight  12/13/11 185 lb 11.2 oz (84.233 kg)     Intake/Output from previous day: 11/04 0701 - 11/05 0700 In: 1320 [P.O.:1320] Out: 302 [Urine:302]   Physical Exam:  Cardiovascular: RRR; systolic murmur Pulmonary: Diminished breath sounds at bases; no rales, wheezes, or rhonchi. Abdomen: Soft, mild distention, non tender,bowel sounds present. Extremities: Mild bilateral lower extremity edema. Wound: Clean and dry.  No erythema or signs of infection.  Lab Results: CBC:No results found for this basename: WBC:2,HGB:2,HCT:2,PLT:2 in the last 72 hours BMET:   Basename 12/11/11 1000  NA 134*  K 3.4*  CL 98  CO2 29  GLUCOSE 132*  BUN 9  CREATININE 0.56  CALCIUM 9.7    PT/INR:  Lab Results  Component Value Date   INR 1.52* 12/06/2011   INR 1.22 11/28/2011   INR 1.00 01/05/2009   ABG:  INR: Will add last result for INR, ABG once components are confirmed Will add last 4 CBG results once components are confirmed  Assessment/Plan:  1. CV - Had a brief run of SVT around 11:29 pm.Maintaining SR since then. On Lopressor 12.5 bid, Cozaar 25 daily. Will  increase Cozaar to 50 bid for better bp control. 2.  Pulmonary - Encourage incentive spirometer 3. Volume Overload - Continue with diuresis. 4.  Acute blood loss anemia - Last H and H 8.2 and 25.9. 5.GI-US of abdomen showed sludge in gallbladder, but no stones, pericholecystic fluid, or gb wall thickening. Amylase and LFTs within normal limits.He has a history of IBS as well as diverticulosis. Questionable if flare up of IBS.C dif ordered  6.DM-CBGs 98/119/102. Only ate breakfast yesterday. Will restart Metformin upon discharge. 7.Will discuss discharge disposition with surgeon  ZIMMERMAN,DONIELLE MPA-C 12/13/2011,7:04 AM     Chart reviewed, patient examined, agree with above. His abdomen is benign, having some BM, afebrile with essentially normal wbc ct. I think he can go home.

## 2011-12-14 DIAGNOSIS — Z48812 Encounter for surgical aftercare following surgery on the circulatory system: Secondary | ICD-10-CM | POA: Diagnosis not present

## 2011-12-14 DIAGNOSIS — M1990 Unspecified osteoarthritis, unspecified site: Secondary | ICD-10-CM | POA: Diagnosis not present

## 2011-12-14 DIAGNOSIS — F329 Major depressive disorder, single episode, unspecified: Secondary | ICD-10-CM | POA: Diagnosis not present

## 2011-12-14 DIAGNOSIS — D509 Iron deficiency anemia, unspecified: Secondary | ICD-10-CM | POA: Diagnosis not present

## 2011-12-14 DIAGNOSIS — E119 Type 2 diabetes mellitus without complications: Secondary | ICD-10-CM | POA: Diagnosis not present

## 2011-12-14 DIAGNOSIS — I10 Essential (primary) hypertension: Secondary | ICD-10-CM | POA: Diagnosis not present

## 2011-12-14 MED FILL — Heparin Sodium (Porcine) Inj 1000 Unit/ML: INTRAMUSCULAR | Qty: 30 | Status: AC

## 2011-12-14 MED FILL — Heparin Sodium (Porcine) Inj 1000 Unit/ML: INTRAMUSCULAR | Qty: 10 | Status: AC

## 2011-12-14 MED FILL — Lidocaine HCl IV Inj 20 MG/ML: INTRAVENOUS | Qty: 5 | Status: AC

## 2011-12-14 MED FILL — Sodium Bicarbonate IV Soln 8.4%: INTRAVENOUS | Qty: 50 | Status: AC

## 2011-12-14 MED FILL — Mannitol IV Soln 20%: INTRAVENOUS | Qty: 500 | Status: AC

## 2011-12-14 MED FILL — Sodium Chloride IV Soln 0.9%: INTRAVENOUS | Qty: 1000 | Status: AC

## 2011-12-14 MED FILL — Sodium Chloride Irrigation Soln 0.9%: Qty: 3000 | Status: AC

## 2011-12-14 MED FILL — Electrolyte-R (PH 7.4) Solution: INTRAVENOUS | Qty: 4000 | Status: AC

## 2011-12-16 DIAGNOSIS — Z48812 Encounter for surgical aftercare following surgery on the circulatory system: Secondary | ICD-10-CM | POA: Diagnosis not present

## 2011-12-16 DIAGNOSIS — D509 Iron deficiency anemia, unspecified: Secondary | ICD-10-CM | POA: Diagnosis not present

## 2011-12-16 DIAGNOSIS — M1990 Unspecified osteoarthritis, unspecified site: Secondary | ICD-10-CM | POA: Diagnosis not present

## 2011-12-16 DIAGNOSIS — I10 Essential (primary) hypertension: Secondary | ICD-10-CM | POA: Diagnosis not present

## 2011-12-16 DIAGNOSIS — F329 Major depressive disorder, single episode, unspecified: Secondary | ICD-10-CM | POA: Diagnosis not present

## 2011-12-16 DIAGNOSIS — E119 Type 2 diabetes mellitus without complications: Secondary | ICD-10-CM | POA: Diagnosis not present

## 2011-12-19 ENCOUNTER — Telehealth: Payer: Self-pay

## 2011-12-19 DIAGNOSIS — D509 Iron deficiency anemia, unspecified: Secondary | ICD-10-CM

## 2011-12-19 DIAGNOSIS — I10 Essential (primary) hypertension: Secondary | ICD-10-CM | POA: Diagnosis not present

## 2011-12-19 DIAGNOSIS — E119 Type 2 diabetes mellitus without complications: Secondary | ICD-10-CM | POA: Diagnosis not present

## 2011-12-19 DIAGNOSIS — Z48812 Encounter for surgical aftercare following surgery on the circulatory system: Secondary | ICD-10-CM | POA: Diagnosis not present

## 2011-12-19 DIAGNOSIS — F329 Major depressive disorder, single episode, unspecified: Secondary | ICD-10-CM | POA: Diagnosis not present

## 2011-12-19 DIAGNOSIS — M1990 Unspecified osteoarthritis, unspecified site: Secondary | ICD-10-CM | POA: Diagnosis not present

## 2011-12-19 NOTE — Telephone Encounter (Signed)
Told patient to come back in next week to have a repeat CBC. Pt agreed.

## 2011-12-19 NOTE — Telephone Encounter (Signed)
Message copied by Jessee Avers on Mon Dec 19, 2011  4:45 PM ------      Message from: Stan Head E      Created: Mon Dec 19, 2011  4:39 PM       Have him get a CBC next week please                  ----- Message -----         From: Jessee Avers, CMA         Sent: 12/16/2011  10:23 AM           To: Iva Boop, MD            See phone note from 11/15/11. You wanted a repeat CBC in one month. Was in the hospital and had CBC on 12/09/11. Please review.

## 2011-12-21 ENCOUNTER — Encounter: Payer: Self-pay | Admitting: Thoracic Surgery (Cardiothoracic Vascular Surgery)

## 2011-12-22 ENCOUNTER — Other Ambulatory Visit (INDEPENDENT_AMBULATORY_CARE_PROVIDER_SITE_OTHER): Payer: Medicare Other

## 2011-12-22 DIAGNOSIS — D509 Iron deficiency anemia, unspecified: Secondary | ICD-10-CM

## 2011-12-22 LAB — CBC WITH DIFFERENTIAL/PLATELET
Eosinophils Relative: 2.1 % (ref 0.0–5.0)
HCT: 31.8 % — ABNORMAL LOW (ref 39.0–52.0)
Lymphs Abs: 1.4 10*3/uL (ref 0.7–4.0)
Monocytes Relative: 9 % (ref 3.0–12.0)
Neutrophils Relative %: 70.9 % (ref 43.0–77.0)
Platelets: 429 10*3/uL — ABNORMAL HIGH (ref 150.0–400.0)
WBC: 7.8 10*3/uL (ref 4.5–10.5)

## 2011-12-22 NOTE — Progress Notes (Signed)
Quick Note:  Cc PCP on this result and the notes ______

## 2011-12-22 NOTE — Progress Notes (Signed)
Quick Note:  Hgb better Needs to be on ferrous sulfate 325 mg bid Needs PCP follow-up of anemia in 1-2 months and if not responding - back to me  ______

## 2011-12-27 DIAGNOSIS — Z48812 Encounter for surgical aftercare following surgery on the circulatory system: Secondary | ICD-10-CM | POA: Diagnosis not present

## 2011-12-27 DIAGNOSIS — E119 Type 2 diabetes mellitus without complications: Secondary | ICD-10-CM | POA: Diagnosis not present

## 2011-12-27 DIAGNOSIS — F329 Major depressive disorder, single episode, unspecified: Secondary | ICD-10-CM | POA: Diagnosis not present

## 2011-12-27 DIAGNOSIS — I10 Essential (primary) hypertension: Secondary | ICD-10-CM | POA: Diagnosis not present

## 2011-12-27 DIAGNOSIS — M1990 Unspecified osteoarthritis, unspecified site: Secondary | ICD-10-CM | POA: Diagnosis not present

## 2011-12-27 DIAGNOSIS — D509 Iron deficiency anemia, unspecified: Secondary | ICD-10-CM | POA: Diagnosis not present

## 2011-12-28 ENCOUNTER — Ambulatory Visit (INDEPENDENT_AMBULATORY_CARE_PROVIDER_SITE_OTHER): Payer: Medicare Other | Admitting: Physician Assistant

## 2011-12-28 ENCOUNTER — Encounter: Payer: Self-pay | Admitting: Physician Assistant

## 2011-12-28 VITALS — BP 132/78 | HR 79 | Ht 69.0 in | Wt 186.8 lb

## 2011-12-28 DIAGNOSIS — Z954 Presence of other heart-valve replacement: Secondary | ICD-10-CM

## 2011-12-28 DIAGNOSIS — I359 Nonrheumatic aortic valve disorder, unspecified: Secondary | ICD-10-CM | POA: Diagnosis not present

## 2011-12-28 DIAGNOSIS — I10 Essential (primary) hypertension: Secondary | ICD-10-CM | POA: Diagnosis not present

## 2011-12-28 DIAGNOSIS — D509 Iron deficiency anemia, unspecified: Secondary | ICD-10-CM

## 2011-12-28 MED ORDER — METOPROLOL TARTRATE 25 MG PO TABS: 25.0000 mg | ORAL_TABLET | Freq: Two times a day (BID) | ORAL | Status: DC

## 2011-12-28 NOTE — Patient Instructions (Addendum)
INCREASE METOPROLOL TO 25 MG 1 TABLET TWICE DAILY  ECHO DX AVR, 401.1  CARDIAC REHAB @ [facility] DX 401.1

## 2011-12-28 NOTE — Progress Notes (Signed)
[address], [address] [place], [place]  [address] Phone: [phone], Fax:  [phone]  Date:  12/28/2011   Name:  [name]   DOB:  August 23, 1945   MRN:  [id number]  PCP:  Provider Not In System  Primary Cardiologist:  Dr. [name]  Primary Electrophysiologist:  None    History of Present Illness: [name] is a 66 y.o. male who returns for followup after recent admission to the hospital for aortic valve replacement.  He has a history of aortic valve replacement in 2006 with a porcine valve, HTN, HL, GERD, DM2. The patient had a syncopal episode in the setting of upper GI bleed in July 2013 without identifiable source. He was then admitted to the hospital 10/21-11/1 after suffering another syncopal episode. Echocardiogram 11/29/11: Moderate LVH, EF 60-65%, grade 1 diastolic dysfunction, critical aortic stenosis with a mean gradient of 80 mmHg. LHC 11/28/11: Normal coronary arteries, severe aortic stenosis. He was referred for surgery. Patient underwent redo aortic valve replacement with a pericardial tissue valve 12/06/11 with Dr. Tressie Stalker.  He did require transfusion of packed blood cells due to blood loss anemia.  He had some abdominal pain and US demonstrated some GB sludge.  Also had some SVT, but no record of AFib.    Since d/c, he is doing ok.  Still has a lot of chest soreness, but this is getting better.  No significant dyspnea.  No orthopnea, PND, edema.  No syncope.  No fevers, chills.  He does note occasional palpitations that are brief.    Labs (10/13:     TSH 1.233 Labs (11/13):    K 3.4, creatinine 0.56, ALT 12, Hgb 10,   Wt Readings from Last 3 Encounters:  12/28/11 186 lb 12.8 oz (84.732 kg)  12/13/11 185 lb 11.2 oz (84.233 kg)  12/13/11 185 lb 11.2 oz (84.233 kg)     Past Medical History  Diagnosis Date  . HTN (hypertension)   . GERD (gastroesophageal reflux disease)     bravo pH study 2008  . Hyperlipidemia   . Hemorrhoids      external and internal  . Insomnia   . Allergic rhinitis   . OA (osteoarthritis)   . Hiatal hernia   . BPH (benign prostatic hypertrophy)   . Chronic cough   . Anemia 2009  . Diverticulosis of colon     on colonoscopy 2008  . OSA (obstructive sleep apnea)     USES CPAP AS NEEDED  . Headache   . IBS (irritable bowel syndrome)   . Periodontitis     chronic with bone loss  . Gout   . Asthma   . Depression     pt denies  . Cataract   . Chronic interstitial cystitis   . Aortic stenosis     a. s/p porcine AVR 2006;  b. s/p redo tissue AVR 12/2011 (Dr. Cornelius Moras) - preAVR LHC with no CAD  . H/O aortic valve replacement with porcine valve     2006, 2013  . Diabetes mellitus     type 2  . S/P aortic valve replacement with bioprosthetic valve 12/06/2011    Redo AVR using 23 mm [facility] Ease pericardial tissue valve    Current Outpatient Prescriptions  Medication Sig Dispense Refill  . aspirin 325 MG EC tablet Take 1 tablet (325 mg total) by mouth daily.  30 tablet    . carbidopa-levodopa (SINEMET) 25-100 MG per tablet Take 1 tablet by  mouth 2 (two) times daily.        . finasteride (PROSCAR) 5 MG tablet Take 5 mg by mouth daily.       . fish oil-omega-3 fatty acids 1000 MG capsule Take 1 g by mouth 2 (two) times daily.       . furosemide (LASIX) 40 MG tablet Take 1 tablet (40 mg total) by mouth daily. For one week then stop.  7 tablet  0  . gabapentin (NEURONTIN) 300 MG capsule Take 300 mg by mouth 2 (two) times daily.        . hydrocodone-acetaminophen (ZYDONE) 5-400 MG per tablet Take 3 tablets by mouth every 4 (four) hours as needed.      Marland Kitchen losartan (COZAAR) 50 MG tablet Take 1 tablet (50 mg total) by mouth daily.  30 tablet  1  . metFORMIN (GLUCOPHAGE) 500 MG tablet Take 500 mg by mouth 2 (two) times daily with a meal.        . metoprolol tartrate (LOPRESSOR) 25 MG tablet Take 0.5 tablets (12.5 mg total) by mouth 2 (two) times daily.  30 tablet  1  . pantoprazole (PROTONIX)  40 MG tablet Take 40 mg by mouth daily.      . potassium chloride SA (K-DUR,KLOR-CON) 20 MEQ tablet Take 1 tablet (20 mEq total) by mouth daily. For one week then stop.  7 tablet  0  . zolpidem (AMBIEN) 10 MG tablet Take 5 mg by mouth at bedtime as needed.      Marland Kitchen acetaminophen (TYLENOL) 500 MG tablet Take 500 mg by mouth every 6 (six) hours as needed. For pain      . docusate sodium (COLACE) 100 MG capsule Take 100 mg by mouth as needed. constipation      . traMADol (ULTRAM) 50 MG tablet Take 1-2 tablets (50-100 mg total) by mouth every 6 (six) hours as needed.  30 tablet  0    Allergies: Allergies  Allergen Reactions  . Codeine   . Codeine Phosphate     REACTION: unspecified  . Dutasteride   . Feraheme (Ferumoxytol) Swelling    Pedal edema    Social History:  The patient  reports that he quit smoking about 40 years ago. He has never used smokeless tobacco. He reports that he does not drink alcohol or use illicit drugs.   ROS:  Please see the history of present illness.   All other systems reviewed and negative.   PHYSICAL EXAM: VS:  BP 132/78  Pulse 79  Ht 5\' 9"  (1.753 m)  Wt 186 lb 12.8 oz (84.732 kg)  BMI 27.59 kg/m2 Well nourished, well developed, in no acute distress HEENT: normal Neck: no JVD Cardiac:  normal S1, S2; RRR; 1-2/6 systolic murmur RUSB and along LSB Chest:  Median sternotomy well healed without erythema or d/c Lungs:  clear to auscultation bilaterally, no wheezing, rhonchi or rales Abd: soft, nontender, no hepatomegaly Ext: no edema Skin: warm and dry Neuro:  CNs 2-12 intact, no focal abnormalities noted  EKG:  NSR, HR 79, NSSTTW changes, RAD      ASSESSMENT AND PLAN:  1. Aortic Stenosis:  Doing well s/p tissue AVR.  He sees Dr. Cornelius Moras next week.  He is interested in cardiac rehab.  He had some brief palpitations.  He is in NSR today on ECG.    -  Refer to Cardiac Rehab    -  Increase metoprolol to 25 mg bid.    -  Arrange follow  up 2D echo.    -   Continue SBE prophylaxis.  2. Hypertension:   Fair control.  Increase Metoprolol as noted.  3. Anemia:  Improved.  Followed by GI.   Luna Glasgow, PA-C  3:25 PM 12/28/2011

## 2011-12-29 ENCOUNTER — Other Ambulatory Visit: Payer: Self-pay | Admitting: Thoracic Surgery (Cardiothoracic Vascular Surgery)

## 2011-12-29 DIAGNOSIS — Z952 Presence of prosthetic heart valve: Secondary | ICD-10-CM

## 2011-12-30 ENCOUNTER — Ambulatory Visit (HOSPITAL_COMMUNITY): Payer: Medicare Other | Attending: Cardiology

## 2011-12-30 DIAGNOSIS — E785 Hyperlipidemia, unspecified: Secondary | ICD-10-CM | POA: Diagnosis not present

## 2011-12-30 DIAGNOSIS — I359 Nonrheumatic aortic valve disorder, unspecified: Secondary | ICD-10-CM | POA: Diagnosis not present

## 2011-12-30 DIAGNOSIS — I517 Cardiomegaly: Secondary | ICD-10-CM | POA: Insufficient documentation

## 2011-12-30 DIAGNOSIS — I10 Essential (primary) hypertension: Secondary | ICD-10-CM | POA: Diagnosis not present

## 2011-12-30 DIAGNOSIS — R55 Syncope and collapse: Secondary | ICD-10-CM | POA: Diagnosis not present

## 2011-12-30 DIAGNOSIS — G4733 Obstructive sleep apnea (adult) (pediatric): Secondary | ICD-10-CM | POA: Insufficient documentation

## 2011-12-30 DIAGNOSIS — Z87891 Personal history of nicotine dependence: Secondary | ICD-10-CM | POA: Diagnosis not present

## 2011-12-30 NOTE — Progress Notes (Signed)
Echocardiogram performed.  

## 2012-01-02 ENCOUNTER — Telehealth: Payer: Self-pay | Admitting: *Deleted

## 2012-01-02 ENCOUNTER — Ambulatory Visit
Admission: RE | Admit: 2012-01-02 | Discharge: 2012-01-02 | Disposition: A | Discharge status: Home / Self Care | Payer: Medicare Other | Source: Ambulatory Visit | Attending: Thoracic Surgery (Cardiothoracic Vascular Surgery) | Admitting: Thoracic Surgery (Cardiothoracic Vascular Surgery)

## 2012-01-02 ENCOUNTER — Ambulatory Visit (INDEPENDENT_AMBULATORY_CARE_PROVIDER_SITE_OTHER): Payer: Self-pay | Admitting: Thoracic Surgery (Cardiothoracic Vascular Surgery)

## 2012-01-02 ENCOUNTER — Encounter: Payer: Self-pay | Admitting: Physician Assistant

## 2012-01-02 VITALS — BP 123/88 | HR 74 | Resp 18 | Ht 69.0 in | Wt 184.0 lb

## 2012-01-02 DIAGNOSIS — Z954 Presence of other heart-valve replacement: Secondary | ICD-10-CM

## 2012-01-02 DIAGNOSIS — Z952 Presence of prosthetic heart valve: Secondary | ICD-10-CM

## 2012-01-02 DIAGNOSIS — I359 Nonrheumatic aortic valve disorder, unspecified: Secondary | ICD-10-CM

## 2012-01-02 DIAGNOSIS — I350 Nonrheumatic aortic (valve) stenosis: Secondary | ICD-10-CM

## 2012-01-02 DIAGNOSIS — Z953 Presence of xenogenic heart valve: Secondary | ICD-10-CM

## 2012-01-02 NOTE — Telephone Encounter (Signed)
Message copied by Tarri Fuller on Mon Jan 02, 2012  9:35 AM ------      Message from: [place], [place] T      Created: Mon Jan 02, 2012  8:27 AM       Normal EF.      AVR is ok.      Tereso Newcomer, PA-C  8:27 AM 01/02/2012

## 2012-01-02 NOTE — Telephone Encounter (Signed)
pt notified of echo results w/verbal understanding today. pt asked for me to fax echo results to Dr. Barry Dienes (surg)

## 2012-01-02 NOTE — Progress Notes (Signed)
[address]            [place] [address]          [phone]     CARDIOTHORACIC SURGERY OFFICE NOTE  Referring Provider is Cassell Clement, MD Primary Cardiologist is [name], MD   HPI:  Patient returns for routine followup status post redo aortic valve replacement on 12/06/2011. His postoperative recovery has been uncomplicated. Since hospital discharge the patient has continued to do well. He was seen in followup at the [facility] cardiology office and underwent a followup echocardiogram last week. He returns for office today reporting that he is doing quite well. He has only mild residual soreness in his chest and he notes that he has had much less pain in his chest this time than he did with his first operation 7 years ago. He has no shortness of breath. His appetite is good. He sleeping well at night. He has not yet started the cardiac rehabilitation program. He reports that his blood sugars have been under fairly good control.   Current Outpatient Prescriptions  Medication Sig Dispense Refill  . acetaminophen (TYLENOL) 500 MG tablet Take 500 mg by mouth every 6 (six) hours as needed. For pain      . aspirin 325 MG EC tablet Take 1 tablet (325 mg total) by mouth daily.  30 tablet    . carbidopa-levodopa (SINEMET) 25-100 MG per tablet Take 1 tablet by mouth 2 (two) times daily.        Marland Kitchen docusate sodium (COLACE) 100 MG capsule Take 100 mg by mouth as needed. constipation      . finasteride (PROSCAR) 5 MG tablet Take 5 mg by mouth daily.       . fish oil-omega-3 fatty acids 1000 MG capsule Take 1 g by mouth 2 (two) times daily.       Marland Kitchen gabapentin (NEURONTIN) 300 MG capsule Take 300 mg by mouth 2 (two) times daily.        . hydrocodone-acetaminophen (ZYDONE) 5-400 MG per tablet Take 3 tablets by mouth every 4 (four) hours as needed.      Marland Kitchen losartan (COZAAR) 50 MG tablet Take 1 tablet (50 mg total) by mouth daily.  30 tablet  1  . metFORMIN  (GLUCOPHAGE) 500 MG tablet Take 500 mg by mouth 2 (two) times daily with a meal.        . metoprolol tartrate (LOPRESSOR) 25 MG tablet Take 1 tablet (25 mg total) by mouth 2 (two) times daily.  30 tablet  11  . pantoprazole (PROTONIX) 40 MG tablet Take 40 mg by mouth daily.      Marland Kitchen zolpidem (AMBIEN) 10 MG tablet Take 5 mg by mouth at bedtime as needed.          Physical Exam:   BP 123/88  Pulse 74  Resp 18  Ht 5\' 9"  (1.753 m)  Wt 184 lb (83.462 kg)  BMI 27.17 kg/m2  SpO2 98%  General:  Well-appearing  Chest:   Clear to auscultation with symmetrical breath sounds  CV:   Regular rate and rhythm with grade 2/6 systolic murmur  Incisions:  Clean and dry and healing nicely, sternum is stable  Abdomen:  Soft and nontender  Extremities:  Warm and well-perfused  Diagnostic Tests:  Transthoracic Echocardiography  Patient: [name], [name] MR #: [id number] Study Date: 12/30/2011 Gender: M Age: 61  Height: 172.7cm Weight: 82.1kg BSA: 1.46m^2 Pt. Status: Room:  ATTENDING Perlie Gold, Burman Freestone, Scott REFERRING Alben Spittle, Scott SONOGRAPHER Aida Raider, RDCS PERFORMING [facility], [facility] cc:  ------------------------------------------------------------ LV EF: 60% - 65%  ------------------------------------------------------------ Indications: 424.1 Aortic valve disorders.  ------------------------------------------------------------ History: PMH: Acquired from the patient and from the patient's chart. PMH: History of Severe Aortic Stenosis. Obstructive Sleep Apnea. Syncope. Risk factors: Former tobacco use. Hypertension. Dyslipidemia.  ------------------------------------------------------------ Study Conclusions  - Left ventricle: The cavity size was normal. Wall thickness was increased in a pattern of moderate LVH. Systolic function was normal. The estimated ejection fraction was in the range of 60% to 65%. Wall motion was normal;  there were no regional wall motion abnormalities. Features are consistent with a pseudonormal left ventricular filling pattern, with concomitant abnormal relaxation and increased filling pressure (grade 2 diastolic dysfunction). - Aortic valve: A bioprosthesis was present. Mild regurgitation. - Mitral valve: Calcified annulus. Mildly thickened leaflets . - Left atrium: The atrium was mildly dilated. - Right atrium: The atrium was mildly dilated.  ------------------------------------------------------------ Labs, prior tests, procedures, and surgery: Aortic Valve Replacement ( #25 mm Edwards Perimount Bovine Pericardial Tissue Valve). Redo Aortic Valve Replacement-2013 ( # 23 mm [facility] Ease Pericardial Tissue Valve). Transthoracic echocardiography. M-mode, complete 2D, spectral Doppler, and color Doppler. Height: Height: 172.7cm. Height: 68in. Weight: Weight: 82.1kg. Weight: 180.6lb. Body mass index: BMI: 27.5kg/m^2. Body surface area: BSA: 1.90m^2. Blood pressure: 132/78. Patient status: Outpatient. Location: [facility]  ------------------------------------------------------------  ------------------------------------------------------------ Left ventricle: The cavity size was normal. Wall thickness was increased in a pattern of moderate LVH. Systolic function was normal. The estimated ejection fraction was in the range of 60% to 65%. Wall motion was normal; there were no regional wall motion abnormalities. Features are consistent with a pseudonormal left ventricular filling pattern, with concomitant abnormal relaxation and increased filling pressure (grade 2 diastolic dysfunction).  ------------------------------------------------------------ Aortic valve: A bioprosthesis was present. Mobility was not restricted. Doppler: Mild regurgitation. VTI ratio of LVOT to aortic valve: 0.48. Peak velocity ratio of LVOT to aortic valve: 0.41. Mean gradient: 19mm Hg (S).  Peak gradient: 34mm Hg (S).  ------------------------------------------------------------ Aorta: Aortic root: The aortic root was mildly dilated.  ------------------------------------------------------------ Mitral valve: Calcified annulus. Mildly thickened leaflets . Mobility was not restricted. Doppler: Transvalvular velocity was within the normal range. There was no evidence for stenosis. Trivial regurgitation. Peak gradient: 4mm Hg (D).  ------------------------------------------------------------ Left atrium: The atrium was mildly dilated.  ------------------------------------------------------------ Right ventricle: The cavity size was normal. Systolic function was normal.  ------------------------------------------------------------ Pulmonic valve: Doppler: Transvalvular velocity was within the normal range. There was no evidence for stenosis.  ------------------------------------------------------------ Tricuspid valve: Structurally normal valve. Doppler: Transvalvular velocity was within the normal range. Mild regurgitation.  ------------------------------------------------------------ Pulmonary artery: Systolic pressure was within the normal range.  ------------------------------------------------------------ Right atrium: The atrium was mildly dilated.  ------------------------------------------------------------ Pericardium: There was no pericardial effusion.  ------------------------------------------------------------ Systemic veins: Inferior vena cava: The vessel was normal in size.  ------------------------------------------------------------  2D measurements Normal Doppler Normal Left ventricle measurements LVID ED, 36.4 mm 43-52 Main pulmonary chord, artery PLAX Pressure, S 22 mm =30 LVID ES, 23.1 mm 23-38 Hg chord, Left ventricle PLAX Ea, lat 13.5 cm/ ------- FS, 37 % >29 ann, tiss s chord, DP PLAX E/Ea, lat 7.56 ------- LVPW, ED 12.79 mm  ------ ann, tiss IVS/LVPW 1.14 <1.3 DP ratio, ED Ea, med 5.95 cm/ ------- Ventricular septum ann, tiss s IVS, ED 14.62 mm ------ DP  Aorta E/Ea, med 17.14 ------- Root 38 mm ------ ann, tiss diam, ED DP Left atrium LVOT AP dim 49 mm ------ Peak vel, S 118 cm/ ------- AP dim 2.5 cm/m^2 <2.2 s index VTI, S 26.5 cm ------- Peak 6 mm ------- gradient, S Hg Aortic valve Peak vel, S 290 cm/ ------- s Mean vel, S 204 cm/ ------- s VTI, S 55 cm ------- Mean 19 mm ------- gradient, S Hg Peak 34 mm ------- gradient, S Hg VTI ratio 0.48 ------- LVOT/AV Peak vel 0.41 ------- ratio, LVOT/AV Regurg PHT 394 ms ------- Mitral valve Peak E vel 102 cm/ ------- s Peak A vel 69.6 cm/ ------- s Deceleratio 232 ms 150-230 n time Peak 4 mm ------- gradient, D Hg Peak E/A 1.5 ------- ratio Tricuspid valve Regurg peak 206 cm/ ------- vel s Peak RV-RA 17 mm ------- gradient, S Hg Systemic veins Estimated 5 mm ------- CVP Hg Right ventricle Pressure, S 22 mm <30 Hg Sa vel, lat 12.4 cm/ ------- ann, tiss s DP  ------------------------------------------------------------ Prepared and Electronically Authenticated by  [name] 2013-11-22T16:40:20.253    *RADIOLOGY REPORT*  Clinical Data: Status post aortic valve replacement.  CHEST - 2 VIEW  Comparison: December 10, 2011.  Findings: Sternotomy wires are noted. Cardiomediastinal silhouette  appears normal. No acute pulmonary disease is noted. Bony thorax  is intact.  IMPRESSION:  No acute cardiopulmonary abnormality seen.  Original Report Authenticated By: Lupita Raider., M.D.   Impression:  Patient is doing exceptionally well less than one month following redo aortic valve replacement.  Plan:  I've encouraged patient to continue to gradually increase his physical activity as tolerated with his primary limitation remaining that he refrain from heavy lifting or strenuous use of his arms or shoulders for at least  another 2 months. I think it is reasonable for him to resume driving an automobile. I've encouraged him to go ahead and start that palpation cardiac rehabilitation program. We have not made any changes in his current medications. All of his questions been addressed. We will plan to see him back in 2 months to make sure that he continues to recover uneventfully.   Salvatore Decent. Cornelius Moras, MD 01/02/2012 12:44 PM

## 2012-01-02 NOTE — Patient Instructions (Addendum)
The patient should continue to avoid any heavy lifting or strenuous use of arms or shoulders for at least a total of three months from the time of surgery. The patient may return to driving an automobile as long as they are no longer requiring oral narcotic pain relievers during the daytime.  It would be wise to start driving only short distances during the daylight and gradually increase from there as they feel comfortable. Start the cardiac rehab program

## 2012-01-18 DIAGNOSIS — I10 Essential (primary) hypertension: Secondary | ICD-10-CM | POA: Diagnosis not present

## 2012-01-18 DIAGNOSIS — Z48812 Encounter for surgical aftercare following surgery on the circulatory system: Secondary | ICD-10-CM | POA: Diagnosis not present

## 2012-01-18 DIAGNOSIS — E119 Type 2 diabetes mellitus without complications: Secondary | ICD-10-CM | POA: Diagnosis not present

## 2012-01-18 DIAGNOSIS — M1990 Unspecified osteoarthritis, unspecified site: Secondary | ICD-10-CM | POA: Diagnosis not present

## 2012-01-18 DIAGNOSIS — F329 Major depressive disorder, single episode, unspecified: Secondary | ICD-10-CM | POA: Diagnosis not present

## 2012-01-18 DIAGNOSIS — D509 Iron deficiency anemia, unspecified: Secondary | ICD-10-CM | POA: Diagnosis not present

## 2012-01-19 ENCOUNTER — Telehealth: Payer: Self-pay | Admitting: *Deleted

## 2012-01-19 MED ORDER — LOSARTAN POTASSIUM 100 MG PO TABS: 100.0000 mg | ORAL_TABLET | Freq: Every day | ORAL | Status: DC

## 2012-01-19 NOTE — Telephone Encounter (Signed)
Spoke with pt, he had brought by a list of bp readings ranging from 171-121/97-69. Per dr Jens Som pt will need to increase losartan to 100 mg once daily. The pt has an appt tomorrow and will get a script to take to the [place] at that time.

## 2012-01-20 ENCOUNTER — Encounter: Payer: Self-pay | Admitting: Cardiology

## 2012-01-20 ENCOUNTER — Ambulatory Visit (INDEPENDENT_AMBULATORY_CARE_PROVIDER_SITE_OTHER): Payer: Medicare Other | Admitting: Cardiology

## 2012-01-20 VITALS — BP 124/82 | HR 72 | Wt 192.0 lb

## 2012-01-20 DIAGNOSIS — R05 Cough: Secondary | ICD-10-CM | POA: Diagnosis not present

## 2012-01-20 MED ORDER — LOSARTAN POTASSIUM 100 MG PO TABS: 100.0000 mg | ORAL_TABLET | Freq: Every day | ORAL | Status: DC

## 2012-01-20 NOTE — Patient Instructions (Addendum)
Your physician wants you to follow-up in: 6 MONTHS WITH DR CRENSHAW You will receive a reminder letter in the mail two months in advance. If you don't receive a letter, please call our office to schedule the follow-up appointment.  

## 2012-01-20 NOTE — Assessment & Plan Note (Signed)
Patient doing well post aVR. Continue SBE prophylaxis. Repeat echocardiogram one year.

## 2012-01-20 NOTE — Progress Notes (Signed)
HPI: Pleasant male for fu of AVR. Previous AVR in 2006 with a porcine valve. The patient had a syncopal episode in the setting of upper GI bleed in July 2013 without identifiable source. He was then admitted to the hospital 10/21-11/1 after suffering another syncopal episode. Echocardiogram 11/29/11: Moderate LVH, EF 60-65%, grade 1 diastolic dysfunction, critical aortic stenosis with a mean gradient of 80 mmHg. LHC 11/28/11: Normal coronary arteries, severe aortic stenosis. Patient underwent redo aortic valve replacement with a pericardial tissue valve 12/06/11 with Dr. Tressie Stalker. followup echocardiogram in November of 2013 showed normal LV function, grade 2 diastolic dysfunction, bioprosthetic aortic valve with mild aortic insufficiency and mean gradient 19 mm of mercury. Mild biatrial enlargement. Since he was last seen, the patient denies any dyspnea on exertion, orthopnea, PND, pedal edema, palpitations, syncope or chest pain.   Current Outpatient Prescriptions  Medication Sig Dispense Refill  . acetaminophen (TYLENOL) 500 MG tablet Take 500 mg by mouth every 6 (six) hours as needed. For pain      . aspirin 325 MG EC tablet Take 1 tablet (325 mg total) by mouth daily.  30 tablet    . carbidopa-levodopa (SINEMET) 25-100 MG per tablet Take 1 tablet by mouth 2 (two) times daily.        . Cholecalciferol (VITAMIN D) 2000 UNITS CAPS Take 1 capsule by mouth daily.      Marland Kitchen docusate sodium (COLACE) 100 MG capsule Take 100 mg by mouth as needed. constipation      . ferrous gluconate (FERGON) 324 MG tablet Take 324 mg by mouth 2 (two) times daily.      . finasteride (PROSCAR) 5 MG tablet Take 5 mg by mouth daily.       . fish oil-omega-3 fatty acids 1000 MG capsule Take 1 g by mouth daily.       Marland Kitchen gabapentin (NEURONTIN) 300 MG capsule Take 300 mg by mouth 2 (two) times daily.        Marland Kitchen losartan (COZAAR) 100 MG tablet Take 1 tablet (100 mg total) by mouth daily.  30 tablet  1  . metoprolol tartrate  (LOPRESSOR) 25 MG tablet Take 1 tablet (25 mg total) by mouth 2 (two) times daily.  30 tablet  11  . pantoprazole (PROTONIX) 40 MG tablet Take 40 mg by mouth daily.      Marland Kitchen zolpidem (AMBIEN) 10 MG tablet Take 5 mg by mouth at bedtime as needed.         Past Medical History  Diagnosis Date  . HTN (hypertension)   . GERD (gastroesophageal reflux disease)     bravo pH study 2008  . Hyperlipidemia   . Hemorrhoids     external and internal  . Insomnia   . Allergic rhinitis   . OA (osteoarthritis)   . Hiatal hernia   . BPH (benign prostatic hypertrophy)   . Chronic cough   . Anemia 2009  . Diverticulosis of colon     on colonoscopy 2008  . OSA (obstructive sleep apnea)     USES CPAP AS NEEDED  . Headache   . IBS (irritable bowel syndrome)   . Periodontitis     chronic with bone loss  . Gout   . Asthma   . Depression     pt denies  . Cataract   . Chronic interstitial cystitis   . Aortic stenosis     a. s/p porcine AVR 2006;  b. s/p redo tissue AVR 12/2011 (Dr. Cornelius Moras) -  preAVR LHC with no CAD  . H/O aortic valve replacement with porcine valve     2006, 2013  . Diabetes mellitus     type 2  . S/P aortic valve replacement with bioprosthetic valve 12/06/2011    Redo AVR using 23 mm [facility] Ease pericardial tissue valve  . Hx of echocardiogram     a. Echo  (post AVR) 12/2011:  mod LVH, EF 60-65%, Gr 2 diast dysfn, mild AI, AVR ok (mean gradient 19 mmHg), MAC, mild BAE    Past Surgical History  Procedure Date  . Aortic valve replacement 10/13/2004    25mm Edwards Perimount pericardial tissue valve  . Nasal septoplasty w/ turbinoplasty   . Hernia repair   . Right knee arthroscopy   . Bunionectomy     right  . Cataract extraction 2009    &   2012    BILATERAL  . Esophagogastroduodenoscopy 08/30/2011    Procedure: ESOPHAGOGASTRODUODENOSCOPY (EGD);  Surgeon: Louis Meckel, MD;  Location: [facility] ENDOSCOPY;  Service: Endoscopy;  Laterality: N/A;  Rm 3005   . Colonoscopy  08/31/2011    Procedure: COLONOSCOPY;  Surgeon: Louis Meckel, MD;  Location: [facility] ENDOSCOPY;  Service: Endoscopy;  Laterality: N/A;  . Refractive surgery     bilateral  . Aortic valve replacement 12/06/2011    Procedure: REDO AORTIC VALVE REPLACEMENT (AVR);  Surgeon: Purcell Nails, MD;  Location: [facility] OR;  Service: Open Heart Surgery;  Laterality: N/A;    History   Social History  . Marital Status: Married    Spouse Name: N/A    Number of Children: 0  . Years of Education: N/A   Occupational History  . retired    Social History Main Topics  . Smoking status: Former Smoker    Quit date: 09/30/1971  . Smokeless tobacco: Never Used  . Alcohol Use: No  . Drug Use: No  . Sexually Active: Not Currently   Other Topics Concern  . Not on file   Social History Narrative  . No narrative on file    ROS: no fevers or chills, productive cough, hemoptysis, dysphasia, odynophagia, melena, hematochezia, dysuria, hematuria, rash, seizure activity, orthopnea, PND, pedal edema, claudication. Remaining systems are negative.  Physical Exam: Well-developed well-nourished in no acute distress.  Skin is warm and dry.  HEENT is normal.  Neck is supple.  Chest is clear to auscultation with normal expansion. Sternotomy without evidence of infection. Cardiovascular exam is regular rate and rhythm. 3/6 systolic murmur left sternal border. Abdominal exam nontender or distended. No masses palpated. Extremities show no edema. neuro grossly intact

## 2012-01-20 NOTE — Assessment & Plan Note (Signed)
Blood pressure is controlled. Continue present medications. 

## 2012-01-20 NOTE — Assessment & Plan Note (Signed)
History of cough syncope.no recurrent episodes.

## 2012-01-26 ENCOUNTER — Encounter (HOSPITAL_COMMUNITY)
Admission: RE | Admit: 2012-01-26 | Discharge: 2012-01-26 | Disposition: A | Discharge status: Home / Self Care | Payer: Medicare Other | Source: Ambulatory Visit | Attending: Internal Medicine | Admitting: Internal Medicine

## 2012-01-26 DIAGNOSIS — D509 Iron deficiency anemia, unspecified: Secondary | ICD-10-CM | POA: Insufficient documentation

## 2012-01-26 NOTE — Progress Notes (Signed)
Cardiac Rehab Medication Review by a Pharmacist  Does the patient  feel that his/her medications are working for him/her?  yes  Has the patient been experiencing any side effects to the medications prescribed?  no  Does the patient measure his/her own blood pressure or blood glucose at home?  yes   Does the patient have any problems obtaining medications due to transportation or finances?   No, gets most from [place]  Understanding of regimen: good Understanding of indications: good Potential of compliance: good  Pharmacist comments: Medication list was reviewed for accuracy, indications, adverse effects, and compliance. Any patient questions were answered at this time.   Abran Duke, PharmD Clinical Pharmacist Phone: [phone] Pager: [phone] 01/26/2012 8:25 AM

## 2012-01-28 ENCOUNTER — Emergency Department (HOSPITAL_COMMUNITY): Payer: Medicare Other

## 2012-01-28 ENCOUNTER — Emergency Department (HOSPITAL_COMMUNITY)
Admission: EM | Admit: 2012-01-28 | Discharge: 2012-01-28 | Disposition: A | Discharge status: Home / Self Care | Payer: Medicare Other | Attending: Emergency Medicine | Admitting: Emergency Medicine

## 2012-01-28 ENCOUNTER — Encounter (HOSPITAL_COMMUNITY): Payer: Self-pay | Admitting: Physical Medicine and Rehabilitation

## 2012-01-28 DIAGNOSIS — Z954 Presence of other heart-valve replacement: Secondary | ICD-10-CM | POA: Diagnosis not present

## 2012-01-28 DIAGNOSIS — E785 Hyperlipidemia, unspecified: Secondary | ICD-10-CM | POA: Insufficient documentation

## 2012-01-28 DIAGNOSIS — M1990 Unspecified osteoarthritis, unspecified site: Secondary | ICD-10-CM | POA: Insufficient documentation

## 2012-01-28 DIAGNOSIS — N400 Enlarged prostate without lower urinary tract symptoms: Secondary | ICD-10-CM | POA: Insufficient documentation

## 2012-01-28 DIAGNOSIS — F3289 Other specified depressive episodes: Secondary | ICD-10-CM | POA: Insufficient documentation

## 2012-01-28 DIAGNOSIS — G4700 Insomnia, unspecified: Secondary | ICD-10-CM | POA: Insufficient documentation

## 2012-01-28 DIAGNOSIS — E119 Type 2 diabetes mellitus without complications: Secondary | ICD-10-CM | POA: Diagnosis not present

## 2012-01-28 DIAGNOSIS — F329 Major depressive disorder, single episode, unspecified: Secondary | ICD-10-CM | POA: Diagnosis not present

## 2012-01-28 DIAGNOSIS — Z79899 Other long term (current) drug therapy: Secondary | ICD-10-CM | POA: Diagnosis not present

## 2012-01-28 DIAGNOSIS — G4733 Obstructive sleep apnea (adult) (pediatric): Secondary | ICD-10-CM | POA: Insufficient documentation

## 2012-01-28 DIAGNOSIS — K589 Irritable bowel syndrome without diarrhea: Secondary | ICD-10-CM | POA: Insufficient documentation

## 2012-01-28 DIAGNOSIS — R0789 Other chest pain: Secondary | ICD-10-CM

## 2012-01-28 DIAGNOSIS — Z7982 Long term (current) use of aspirin: Secondary | ICD-10-CM | POA: Diagnosis not present

## 2012-01-28 DIAGNOSIS — R51 Headache: Secondary | ICD-10-CM | POA: Diagnosis not present

## 2012-01-28 DIAGNOSIS — J45909 Unspecified asthma, uncomplicated: Secondary | ICD-10-CM | POA: Diagnosis not present

## 2012-01-28 DIAGNOSIS — Z87891 Personal history of nicotine dependence: Secondary | ICD-10-CM | POA: Diagnosis not present

## 2012-01-28 DIAGNOSIS — Z8679 Personal history of other diseases of the circulatory system: Secondary | ICD-10-CM | POA: Diagnosis not present

## 2012-01-28 DIAGNOSIS — Z8719 Personal history of other diseases of the digestive system: Secondary | ICD-10-CM | POA: Diagnosis not present

## 2012-01-28 DIAGNOSIS — D649 Anemia, unspecified: Secondary | ICD-10-CM | POA: Insufficient documentation

## 2012-01-28 DIAGNOSIS — K219 Gastro-esophageal reflux disease without esophagitis: Secondary | ICD-10-CM | POA: Diagnosis not present

## 2012-01-28 LAB — COMPREHENSIVE METABOLIC PANEL
ALT: <5 U/L (ref 0–53)
AST: 33 U/L (ref 0–37)
Alkaline Phosphatase: 79 U/L (ref 39–117)
CO2: 25 mEq/L (ref 19–32)
Calcium: 10.2 mg/dL (ref 8.4–10.5)
Chloride: 97 mEq/L (ref 96–112)
GFR calc Af Amer: >90 mL/min (ref >90)
GFR calc non Af Amer: >90 mL/min (ref >90)
Glucose, Bld: 118 mg/dL — ABNORMAL HIGH (ref 70–99)
Potassium: 4.2 mEq/L (ref 3.5–5.1)
Sodium: 134 mEq/L — ABNORMAL LOW (ref 135–145)
Total Bilirubin: 0.6 mg/dL (ref 0.3–1.2)

## 2012-01-28 LAB — CBC WITH DIFFERENTIAL/PLATELET
Eosinophils Absolute: 0.2 10*3/uL (ref 0.0–0.7)
Hemoglobin: 10.4 g/dL — ABNORMAL LOW (ref 13.0–17.0)
Lymphocytes Relative: 20 % (ref 12–46)
Lymphs Abs: 1.1 10*3/uL (ref 0.7–4.0)
MCH: 26.4 pg (ref 26.0–34.0)
Monocytes Relative: 10 % (ref 3–12)
Neutrophils Relative %: 66 % (ref 43–77)
Platelets: 197 10*3/uL (ref 150–400)
RBC: 3.94 MIL/uL — ABNORMAL LOW (ref 4.22–5.81)
WBC: 5.3 10*3/uL (ref 4.0–10.5)

## 2012-01-28 LAB — URINALYSIS, ROUTINE W REFLEX MICROSCOPIC
Glucose, UA: NEGATIVE mg/dL
Hgb urine dipstick: NEGATIVE
Leukocytes, UA: NEGATIVE
Specific Gravity, Urine: 1.008 (ref 1.005–1.030)
pH: 7 (ref 5.0–8.0)

## 2012-01-28 MED ORDER — DIAZEPAM 5 MG PO TABS: 2.5000 mg | ORAL_TABLET | Freq: Two times a day (BID) | ORAL | Status: DC

## 2012-01-28 NOTE — ED Provider Notes (Addendum)
History     CSN: [id number]  Arrival date & time 01/28/12  0710   First MD Initiated Contact with Patient 01/28/12 [phone]      Chief Complaint  Patient presents with  . Hypertension    (Consider location/radiation/quality/duration/timing/severity/associated sxs/prior treatment) Patient is a 66 y.o. male presenting with headaches and chest pain. The history is provided by the patient.  Headache  This is a chronic problem. Episode onset: has been going on for months but worse yesterday and overnight. The problem occurs constantly (worse in the morning). The problem has been gradually worsening. The headache is associated with nothing. Pain location: states the pain is in the top of his head. The quality of the pain is described as throbbing. The pain is at a severity of 7/10. The pain is moderate. The pain does not radiate. Pertinent negatives include no anorexia, no fever, no palpitations, no shortness of breath, no nausea and no vomiting. Treatments tried: vicodin. The treatment provided moderate relief.  Chest Pain The chest pain began yesterday. Duration of episode(s) is 10 minutes. Chest pain occurs constantly. The chest pain is resolved. Associated with: started about 30 min after eating peaches. At its most intense, the pain is at 4/10. The pain is currently at 0/10. The severity of the pain is mild. The quality of the pain is described as aching and pressure-like. The pain does not radiate. Chest pain is worsened by eating. Pertinent negatives for primary symptoms include no fever, no shortness of breath, no cough, no palpitations, no nausea and no vomiting.  Pertinent negatives for associated symptoms include no diaphoresis and no weakness. He tried nothing for the symptoms. Risk factors include being elderly.  His past medical history is significant for hyperlipidemia and hypertension.  Pertinent negatives for past medical history include no CAD, no MI and no PE. Past medical history  comments: aortic valve replacement x 2  Procedure history is positive for echocardiogram.     Past Medical History  Diagnosis Date  . HTN (hypertension)   . GERD (gastroesophageal reflux disease)     bravo pH study 2008  . Hyperlipidemia   . Hemorrhoids     external and internal  . Insomnia   . Allergic rhinitis   . OA (osteoarthritis)   . Hiatal hernia   . BPH (benign prostatic hypertrophy)   . Chronic cough   . Anemia 2009  . Diverticulosis of colon     on colonoscopy 2008  . OSA (obstructive sleep apnea)     USES CPAP AS NEEDED  . Headache   . IBS (irritable bowel syndrome)   . Periodontitis     chronic with bone loss  . Gout   . Asthma   . Depression     pt denies  . Cataract   . Chronic interstitial cystitis   . Aortic stenosis     a. s/p porcine AVR 2006;  b. s/p redo tissue AVR 12/2011 (Dr. Cornelius Moras) - preAVR LHC with no CAD  . H/O aortic valve replacement with porcine valve     2006, 2013  . Diabetes mellitus     type 2  . S/P aortic valve replacement with bioprosthetic valve 12/06/2011    Redo AVR using 23 mm [facility] Ease pericardial tissue valve  . Hx of echocardiogram     a. Echo  (post AVR) 12/2011:  mod LVH, EF 60-65%, Gr 2 diast dysfn, mild AI, AVR ok (mean gradient 19 mmHg), MAC, mild BAE  Past Surgical History  Procedure Date  . Aortic valve replacement 10/13/2004    25mm Edwards Perimount pericardial tissue valve  . Nasal septoplasty w/ turbinoplasty   . Hernia repair   . Right knee arthroscopy   . Bunionectomy     right  . Cataract extraction 2009    &   2012    BILATERAL  . Esophagogastroduodenoscopy 08/30/2011    Procedure: ESOPHAGOGASTRODUODENOSCOPY (EGD);  Surgeon: Louis Meckel, MD;  Location: [facility] ENDOSCOPY;  Service: Endoscopy;  Laterality: N/A;  Rm 3005   . Colonoscopy 08/31/2011    Procedure: COLONOSCOPY;  Surgeon: Louis Meckel, MD;  Location: [facility] ENDOSCOPY;  Service: Endoscopy;  Laterality: N/A;  . Refractive surgery      bilateral  . Aortic valve replacement 12/06/2011    Procedure: REDO AORTIC VALVE REPLACEMENT (AVR);  Surgeon: Purcell Nails, MD;  Location: [facility] OR;  Service: Open Heart Surgery;  Laterality: N/A;    Family History  Problem Relation Age of Onset  . Heart disease Father   . Osteoarthritis Mother   . Hypertension Sister   . Hyperlipidemia      History  Substance Use Topics  . Smoking status: Former Smoker    Quit date: 09/30/1971  . Smokeless tobacco: Never Used  . Alcohol Use: No      Review of Systems  Constitutional: Negative for fever and diaphoresis.  Respiratory: Negative for cough and shortness of breath.   Cardiovascular: Positive for chest pain. Negative for palpitations.  Gastrointestinal: Negative for nausea, vomiting and anorexia.  Neurological: Positive for headaches. Negative for weakness.  All other systems reviewed and are negative.    Allergies  Codeine; Codeine phosphate; Dutasteride; and Feraheme  Home Medications   Current Outpatient Rx  Name  Route  Sig  Dispense  Refill  . ASPIRIN 325 MG PO TBEC   Oral   Take 1 tablet (325 mg total) by mouth daily.   30 tablet      . CARBIDOPA-LEVODOPA 25-100 MG PO TABS   Oral   Take 1 tablet by mouth 3 (three) times daily.          Marland Kitchen VITAMIN D 2000 UNITS PO CAPS   Oral   Take 1 capsule by mouth daily.         Marland Kitchen DOCUSATE SODIUM 100 MG PO CAPS   Oral   Take 100 mg by mouth 2 (two) times daily. constipation         . FERROUS GLUCONATE 324 (38 FE) MG PO TABS   Oral   Take 324 mg by mouth 2 (two) times daily with a meal.          . FINASTERIDE 5 MG PO TABS   Oral   Take 5 mg by mouth daily.          . OMEGA-3 FATTY ACIDS 1000 MG PO CAPS   Oral   Take 1 g by mouth 2 (two) times daily.          Marland Kitchen GABAPENTIN 400 MG PO CAPS   Oral   Take 400 mg by mouth 2 (two) times daily.         Marland Kitchen HYDROCODONE-ACETAMINOPHEN 5-325 MG PO TABS   Oral   Take 1 tablet by mouth every 8 (eight) hours as  needed.         Marland Kitchen LOSARTAN POTASSIUM 100 MG PO TABS   Oral   Take 1 tablet (100 mg total) by mouth daily.  90 tablet   4   . METOPROLOL TARTRATE 25 MG PO TABS   Oral   Take 1 tablet (25 mg total) by mouth 2 (two) times daily.   30 tablet   11   . PANTOPRAZOLE SODIUM 40 MG PO TBEC   Oral   Take 40 mg by mouth daily.         Marland Kitchen POLYETHYLENE GLYCOL 3350 PO PACK   Oral   Take 17 g by mouth daily as needed.         Marland Kitchen ZOLPIDEM TARTRATE 10 MG PO TABS   Oral   Take 5 mg by mouth at bedtime as needed. For sleep           BP 192/103  Pulse 60  Temp 98.3 F (36.8 C) (Oral)  Resp 20  SpO2 99%  Physical Exam  Nursing note and vitals reviewed. Constitutional: He is oriented to person, place, and time. He appears well-developed and well-nourished. No distress.  HENT:  Head: Normocephalic and atraumatic.  Mouth/Throat: Oropharynx is clear and moist.  Eyes: Conjunctivae normal and EOM are normal. Pupils are equal, round, and reactive to light.  Neck: Normal range of motion. Neck supple.  Cardiovascular: Normal rate, regular rhythm and intact distal pulses.   Murmur heard.  Crescendo systolic murmur is present with a grade of 3/6   No diastolic murmur is present  Pulmonary/Chest: Effort normal and breath sounds normal. No respiratory distress. He has no wheezes. He has no rales.  Abdominal: Soft. He exhibits no distension. There is no tenderness. There is no rebound and no guarding.  Musculoskeletal: Normal range of motion. He exhibits no edema and no tenderness.  Neurological: He is alert and oriented to person, place, and time.  Skin: Skin is warm and dry. No rash noted. No erythema.  Psychiatric: He has a normal mood and affect. His behavior is normal.    ED Course  Procedures (including critical care time)  Labs Reviewed  CBC WITH DIFFERENTIAL - Abnormal; Notable for the following:    RBC 3.94 (*)     Hemoglobin 10.4 (*)     HCT 33.9 (*)     RDW 18.7 (*)      All other components within normal limits  COMPREHENSIVE METABOLIC PANEL - Abnormal; Notable for the following:    Sodium 134 (*)     Glucose, Bld 118 (*)     All other components within normal limits  TROPONIN I  URINALYSIS, ROUTINE W REFLEX MICROSCOPIC   Dg Chest 2 View  01/28/2012  *RADIOLOGY REPORT*  Clinical Data: Mid chest tightness, history of prior aortic valve replacement  CHEST - 2 VIEW  Comparison: Chest x-ray of 01/02/2012  Findings: No active infiltrate or effusion is seen.  Mild cardiomegaly is stable.  Median sternotomy sutures are noted from prior aortic valve replacement.  No acute bony abnormality is seen.  IMPRESSION: Stable chest x-ray.  Stable mild cardiomegaly.   Original Report Authenticated By: Dwyane Dee, M.D.    Ct Head Wo Contrast  01/28/2012  *RADIOLOGY REPORT*  Clinical Data: Headache.  Hypertension.  CT HEAD WITHOUT CONTRAST  Technique:  Contiguous axial images were obtained from the base of the skull through the vertex without contrast.  Comparison: Head CT scan 02/09/2009.  Findings: There is some scattered hypoattenuation in the subcortical deep white matter.  No evidence of acute intracranial abnormality including infarction, hemorrhage, mass lesion, mass effect, midline shift or abnormal extra-axial fluid collection is identified.  There is no hydrocephalus or pneumocephalus.  The calvarium is intact.  IMPRESSION: No acute finding.  Mild appearing chronic microvascular ischemic change.   Original Report Authenticated By: Holley Dexter, M.D.      Date: 01/28/2012  Rate: 62  Rhythm: normal sinus rhythm  QRS Axis: normal  Intervals: normal  ST/T Wave abnormalities: nonspecific T wave changes  Conduction Disutrbances:none  Narrative Interpretation:   Old EKG Reviewed: unchanged    No diagnosis found.    MDM   Patient presenting with 2 separate issues. Initially states that he has worsening headaches that brought him in today. The headache started  yesterday after he had a drop in his blood sugar. He states that he gets headaches every day they're usually worse in the morning when he wakes up. He's been getting treatment for the headaches for months however today it was worse and he was unable to sleep last night. It does not affect his vision and he has no focal neurologic complaints. He has a history of a aortic valve replacement x2 but is on no anticoagulation at this time. He denies trauma and has a normal neurologic exam. Blood pressure at home was 179/93 however repeat here was 151/75 and doubt that this is the cause of his headache. Patient also states that he has chronic back and neck issues which may be the trigger for his pain. He denies any change in medication and prior to arrival he took Vicodin and states his headache is improved.  CT of the brain is negative for acute bleed or space-occupying lesion. Feel the headache is most likely from musculoskeletal triggers. Discussed trying muscle relaxer to help with the headaches. Also because the headaches are worse in the morning and may be from obstructive sleep apnea and will discuss the patient is wearing CPAP.  Secondly patient complained of chest pain last night around 9 PM that lasted less than 10 minutes with no associated symptoms. The pain started 30 minutes after eating peaches. He states that he's not had any pain since his bowel replacement approximately 3 months ago. During that time prior to the replacement he had a heart catheterization that showed no obstructing lesions. He has no history of MI or stenting. EKG was unchanged. Chest x-ray is unchanged. CBC, CMP, troponin pending.  Low suspicion for cardiac etiology at this time will discuss with [facility] cardiology  10:14 AM Imaging is within normal limits. All labs are unrevealing. Spoke with cardiology about prior episode of chest pain and given and is now greater than 12 hours since symptom onset with a normal troponin low  suspicion for cardiac etiology but with home outpt followup. Looking back through multiple medical records patient chronically complains of a headache which is most often from noncompliance with his CPAP machine. All his findings were discussed with the patient and will have him followup with his PCP at the [place].      Gwyneth Sprout, MD 01/28/12 1015  Gwyneth Sprout, MD 01/28/12 1021

## 2012-01-28 NOTE — ED Notes (Signed)
Pt attempting to void at the time.  

## 2012-01-28 NOTE — ED Notes (Signed)
Pt presents to department for evaluation of chest pain, hypertension and headache. Onset yesterday afternoon. States BP of 179/93 at home. Also states midsternal non radiating chest pain. Denies CP at present, but states 6/10 headache. Respirations unlabored. He is conscious alert and oriented x4. Skin warm and dry.

## 2012-01-28 NOTE — ED Notes (Signed)
Patient transported to X-ray 

## 2012-01-30 ENCOUNTER — Ambulatory Visit (HOSPITAL_COMMUNITY): Payer: Self-pay

## 2012-01-30 ENCOUNTER — Encounter (HOSPITAL_COMMUNITY): Payer: Medicare Other

## 2012-02-03 ENCOUNTER — Encounter (HOSPITAL_COMMUNITY): Payer: Medicare Other

## 2012-02-03 ENCOUNTER — Telehealth (HOSPITAL_COMMUNITY): Payer: Self-pay | Admitting: *Deleted

## 2012-02-06 ENCOUNTER — Encounter (HOSPITAL_COMMUNITY): Payer: Medicare Other

## 2012-02-06 ENCOUNTER — Encounter (HOSPITAL_COMMUNITY)
Admission: RE | Admit: 2012-02-06 | Discharge: 2012-02-06 | Disposition: A | Discharge status: Home / Self Care | Payer: Medicare Other | Source: Ambulatory Visit | Attending: Cardiology | Admitting: Cardiology

## 2012-02-06 DIAGNOSIS — D509 Iron deficiency anemia, unspecified: Secondary | ICD-10-CM | POA: Diagnosis not present

## 2012-02-06 NOTE — Progress Notes (Signed)
Today is [name] first day of exercise.  Telemetry rhythm Sinus with intermittent Pvc's.  Patient asymptomatic.  Vital signs stable.  ECG tracing's faxed to Dr Ludwig Clarks office for review.  No new orders received. Will continue to monitor the patient throughout  the program.

## 2012-02-07 LAB — GLUCOSE, CAPILLARY: Glucose-Capillary: 98 mg/dL (ref 70–99)

## 2012-02-08 ENCOUNTER — Encounter (HOSPITAL_COMMUNITY): Payer: Medicare Other

## 2012-02-09 DIAGNOSIS — M1990 Unspecified osteoarthritis, unspecified site: Secondary | ICD-10-CM | POA: Diagnosis not present

## 2012-02-09 DIAGNOSIS — Z48812 Encounter for surgical aftercare following surgery on the circulatory system: Secondary | ICD-10-CM | POA: Diagnosis not present

## 2012-02-09 DIAGNOSIS — F329 Major depressive disorder, single episode, unspecified: Secondary | ICD-10-CM | POA: Diagnosis not present

## 2012-02-09 DIAGNOSIS — I10 Essential (primary) hypertension: Secondary | ICD-10-CM | POA: Diagnosis not present

## 2012-02-09 DIAGNOSIS — D509 Iron deficiency anemia, unspecified: Secondary | ICD-10-CM | POA: Diagnosis not present

## 2012-02-09 DIAGNOSIS — E119 Type 2 diabetes mellitus without complications: Secondary | ICD-10-CM | POA: Diagnosis not present

## 2012-02-10 ENCOUNTER — Encounter (HOSPITAL_COMMUNITY): Payer: Medicare Other

## 2012-02-11 DIAGNOSIS — J069 Acute upper respiratory infection, unspecified: Secondary | ICD-10-CM | POA: Diagnosis not present

## 2012-02-13 ENCOUNTER — Encounter (HOSPITAL_COMMUNITY): Admission: RE | Admit: 2012-02-13 | Payer: Medicare Other | Source: Ambulatory Visit

## 2012-02-13 ENCOUNTER — Encounter (HOSPITAL_COMMUNITY): Payer: Medicare Other

## 2012-02-15 ENCOUNTER — Encounter (HOSPITAL_COMMUNITY)
Admission: RE | Admit: 2012-02-15 | Discharge: 2012-02-15 | Disposition: A | Discharge status: Home / Self Care | Payer: Medicare Other | Source: Ambulatory Visit | Attending: Internal Medicine | Admitting: Internal Medicine

## 2012-02-15 ENCOUNTER — Encounter (HOSPITAL_COMMUNITY): Payer: Medicare Other

## 2012-02-15 DIAGNOSIS — Z5189 Encounter for other specified aftercare: Secondary | ICD-10-CM | POA: Insufficient documentation

## 2012-02-15 DIAGNOSIS — I5043 Acute on chronic combined systolic (congestive) and diastolic (congestive) heart failure: Secondary | ICD-10-CM | POA: Diagnosis not present

## 2012-02-15 DIAGNOSIS — I359 Nonrheumatic aortic valve disorder, unspecified: Secondary | ICD-10-CM | POA: Insufficient documentation

## 2012-02-15 DIAGNOSIS — Z954 Presence of other heart-valve replacement: Secondary | ICD-10-CM | POA: Diagnosis not present

## 2012-02-17 ENCOUNTER — Encounter (HOSPITAL_COMMUNITY): Payer: Medicare Other

## 2012-02-20 ENCOUNTER — Encounter (HOSPITAL_COMMUNITY): Payer: Medicare Other

## 2012-02-20 ENCOUNTER — Encounter: Payer: Self-pay | Admitting: Cardiology

## 2012-02-22 ENCOUNTER — Encounter (HOSPITAL_COMMUNITY): Payer: Medicare Other

## 2012-02-22 ENCOUNTER — Encounter (HOSPITAL_COMMUNITY)
Admission: RE | Admit: 2012-02-22 | Discharge: 2012-02-22 | Disposition: A | Discharge status: Home / Self Care | Payer: Medicare Other | Source: Ambulatory Visit | Attending: Cardiology | Admitting: Cardiology

## 2012-02-22 DIAGNOSIS — Z954 Presence of other heart-valve replacement: Secondary | ICD-10-CM | POA: Diagnosis not present

## 2012-02-22 DIAGNOSIS — Z5189 Encounter for other specified aftercare: Secondary | ICD-10-CM | POA: Diagnosis not present

## 2012-02-22 DIAGNOSIS — I5043 Acute on chronic combined systolic (congestive) and diastolic (congestive) heart failure: Secondary | ICD-10-CM | POA: Diagnosis not present

## 2012-02-22 DIAGNOSIS — I359 Nonrheumatic aortic valve disorder, unspecified: Secondary | ICD-10-CM | POA: Diagnosis not present

## 2012-02-23 ENCOUNTER — Telehealth: Payer: Self-pay | Admitting: Cardiology

## 2012-02-23 NOTE — Telephone Encounter (Signed)
Walk In pt Form " Pt Dropped Off BP Readings" Placed In Doc  Box 02/23/12/KM

## 2012-02-24 ENCOUNTER — Encounter (HOSPITAL_COMMUNITY): Payer: Medicare Other

## 2012-02-24 ENCOUNTER — Telehealth: Payer: Self-pay | Admitting: *Deleted

## 2012-02-24 ENCOUNTER — Encounter (HOSPITAL_COMMUNITY)
Admission: RE | Admit: 2012-02-24 | Discharge: 2012-02-24 | Disposition: A | Discharge status: Home / Self Care | Payer: Medicare Other | Source: Ambulatory Visit | Attending: Cardiology | Admitting: Cardiology

## 2012-02-24 DIAGNOSIS — Z954 Presence of other heart-valve replacement: Secondary | ICD-10-CM | POA: Diagnosis not present

## 2012-02-24 DIAGNOSIS — I10 Essential (primary) hypertension: Secondary | ICD-10-CM

## 2012-02-24 DIAGNOSIS — I5043 Acute on chronic combined systolic (congestive) and diastolic (congestive) heart failure: Secondary | ICD-10-CM | POA: Diagnosis not present

## 2012-02-24 DIAGNOSIS — Z5189 Encounter for other specified aftercare: Secondary | ICD-10-CM | POA: Diagnosis not present

## 2012-02-24 DIAGNOSIS — I359 Nonrheumatic aortic valve disorder, unspecified: Secondary | ICD-10-CM | POA: Diagnosis not present

## 2012-02-24 MED ORDER — METOPROLOL TARTRATE 50 MG PO TABS: 50.0000 mg | ORAL_TABLET | Freq: Two times a day (BID) | ORAL | Status: DC

## 2012-02-24 NOTE — Telephone Encounter (Signed)
Received note and bp readings from pt, per dr Jens Som pt to change metoprolol to 50 mg bid. Script mailed to pt.

## 2012-02-27 ENCOUNTER — Encounter (HOSPITAL_COMMUNITY)
Admission: RE | Admit: 2012-02-27 | Discharge: 2012-02-27 | Disposition: A | Discharge status: Home / Self Care | Payer: Medicare Other | Source: Ambulatory Visit | Attending: Cardiology | Admitting: Cardiology

## 2012-02-27 ENCOUNTER — Encounter (HOSPITAL_COMMUNITY): Payer: Medicare Other

## 2012-02-27 DIAGNOSIS — I359 Nonrheumatic aortic valve disorder, unspecified: Secondary | ICD-10-CM | POA: Diagnosis not present

## 2012-02-27 DIAGNOSIS — Z954 Presence of other heart-valve replacement: Secondary | ICD-10-CM | POA: Diagnosis not present

## 2012-02-27 DIAGNOSIS — Z5189 Encounter for other specified aftercare: Secondary | ICD-10-CM | POA: Diagnosis not present

## 2012-02-27 DIAGNOSIS — I5043 Acute on chronic combined systolic (congestive) and diastolic (congestive) heart failure: Secondary | ICD-10-CM | POA: Diagnosis not present

## 2012-02-27 NOTE — Progress Notes (Signed)
Reviewed home exercise with pt today.  Pt plans to walk at home and stores for exercise.  Reviewed THR, pulse, RPE, sign and symptoms, and when to call 911 or MD.  Pt voiced understanding. Fabio Pierce, MA, ACSM RCEP

## 2012-02-28 DIAGNOSIS — Z48812 Encounter for surgical aftercare following surgery on the circulatory system: Secondary | ICD-10-CM

## 2012-02-29 ENCOUNTER — Encounter (HOSPITAL_COMMUNITY): Payer: Medicare Other

## 2012-02-29 ENCOUNTER — Encounter: Payer: Self-pay | Admitting: Cardiology

## 2012-02-29 NOTE — Progress Notes (Signed)
HPI: Pleasant male for fu of AVR. Previous AVR in 2006 with a porcine valve. The patient had a syncopal episode in the setting of upper GI bleed in July 2013 without identifiable source. He was then admitted to the hospital 10/21-11/1 after suffering another syncopal episode. Echocardiogram 11/29/11: Moderate LVH, EF 60-65%, grade 1 diastolic dysfunction, critical aortic stenosis with a mean gradient of 80 mmHg. LHC 11/28/11: Normal coronary arteries, severe aortic stenosis. Patient underwent redo aortic valve replacement with a pericardial tissue valve 12/06/11 with Dr. Tressie Stalker. Followup echocardiogram in November of 2013 showed normal LV function, grade 2 diastolic dysfunction, bioprosthetic aortic valve with mild aortic insufficiency and mean gradient 19 mm of mercury. Mild biatrial enlargement. Since he was last seen in Dec 2013, the patient denies any dyspnea on exertion, orthopnea, PND, pedal edema, palpitations, syncope or chest pain.   Current Outpatient Prescriptions  Medication Sig Dispense Refill  . aspirin 325 MG EC tablet Take 1 tablet (325 mg total) by mouth daily.  30 tablet    . carbidopa-levodopa (SINEMET) 25-100 MG per tablet Take 1 tablet by mouth 3 (three) times daily.       . Cholecalciferol (VITAMIN D) 2000 UNITS CAPS Take 1 capsule by mouth daily.      . diazepam (VALIUM) 5 MG tablet Take 0.5 tablets (2.5 mg total) by mouth 2 (two) times daily.  10 tablet  0  . docusate sodium (COLACE) 100 MG capsule Take 100 mg by mouth 2 (two) times daily. constipation      . ferrous gluconate (FERGON) 324 MG tablet Take 324 mg by mouth 2 (two) times daily with a meal.       . finasteride (PROSCAR) 5 MG tablet Take 5 mg by mouth daily.       . fish oil-omega-3 fatty acids 1000 MG capsule Take 1 g by mouth 2 (two) times daily.       Marland Kitchen gabapentin (NEURONTIN) 400 MG capsule Take 400 mg by mouth 2 (two) times daily.      Marland Kitchen HYDROcodone-acetaminophen (NORCO/VICODIN) 5-325 MG per tablet Take  1 tablet by mouth every 8 (eight) hours as needed.      Marland Kitchen losartan (COZAAR) 100 MG tablet Take 1 tablet (100 mg total) by mouth daily.  90 tablet  4  . metoprolol tartrate (LOPRESSOR) 50 MG tablet Take 1 tablet (50 mg total) by mouth 2 (two) times daily.  180 tablet  4  . pantoprazole (PROTONIX) 40 MG tablet Take 40 mg by mouth daily.      . polyethylene glycol (MIRALAX / GLYCOLAX) packet Take 17 g by mouth daily as needed.      . zolpidem (AMBIEN) 10 MG tablet Take 5 mg by mouth at bedtime as needed. For sleep         Past Medical History  Diagnosis Date  . HTN (hypertension)   . GERD (gastroesophageal reflux disease)     bravo pH study 2008  . Hyperlipidemia   . Hemorrhoids     external and internal  . Insomnia   . Allergic rhinitis   . OA (osteoarthritis)   . Hiatal hernia   . BPH (benign prostatic hypertrophy)   . Chronic cough   . Anemia 2009  . Diverticulosis of colon     on colonoscopy 2008  . OSA (obstructive sleep apnea)     USES CPAP AS NEEDED  . Headache   . IBS (irritable bowel syndrome)   . Periodontitis  chronic with bone loss  . Gout   . Asthma   . Depression     pt denies  . Cataract   . Chronic interstitial cystitis   . Aortic stenosis     a. s/p porcine AVR 2006;  b. s/p redo tissue AVR 12/2011 (Dr. Cornelius Moras) - preAVR LHC with no CAD  . H/O aortic valve replacement with porcine valve     2006, 2013  . Diabetes mellitus     type 2  . S/P aortic valve replacement with bioprosthetic valve 12/06/2011    Redo AVR using 23 mm [facility] Ease pericardial tissue valve  . Hx of echocardiogram     a. Echo  (post AVR) 12/2011:  mod LVH, EF 60-65%, Gr 2 diast dysfn, mild AI, AVR ok (mean gradient 19 mmHg), MAC, mild BAE    Past Surgical History  Procedure Date  . Aortic valve replacement 10/13/2004    25mm Edwards Perimount pericardial tissue valve  . Nasal septoplasty w/ turbinoplasty   . Hernia repair   . Right knee arthroscopy   . Bunionectomy      right  . Cataract extraction 2009    &   2012    BILATERAL  . Esophagogastroduodenoscopy 08/30/2011    Procedure: ESOPHAGOGASTRODUODENOSCOPY (EGD);  Surgeon: Louis Meckel, MD;  Location: [facility] ENDOSCOPY;  Service: Endoscopy;  Laterality: N/A;  Rm 3005   . Colonoscopy 08/31/2011    Procedure: COLONOSCOPY;  Surgeon: Louis Meckel, MD;  Location: [facility] ENDOSCOPY;  Service: Endoscopy;  Laterality: N/A;  . Refractive surgery     bilateral  . Aortic valve replacement 12/06/2011    Procedure: REDO AORTIC VALVE REPLACEMENT (AVR);  Surgeon: Purcell Nails, MD;  Location: [facility] OR;  Service: Open Heart Surgery;  Laterality: N/A;    History   Social History  . Marital Status: Married    Spouse Name: N/A    Number of Children: 0  . Years of Education: N/A   Occupational History  . retired    Social History Main Topics  . Smoking status: Former Smoker    Quit date: 09/30/1971  . Smokeless tobacco: Never Used  . Alcohol Use: No  . Drug Use: No  . Sexually Active: Not Currently   Other Topics Concern  . Not on file   Social History Narrative  . No narrative on file    ROS: no fevers or chills, productive cough, hemoptysis, dysphasia, odynophagia, melena, hematochezia, dysuria, hematuria, rash, seizure activity, orthopnea, PND, pedal edema, claudication. Remaining systems are negative.  Physical Exam: Well-developed well-nourished in no acute distress.  Skin is warm and dry.  HEENT is normal.  Neck is supple.  Chest is clear to auscultation with normal expansion.  Cardiovascular exam is regular rate and rhythm.  Abdominal exam nontender or distended. No masses palpated. Extremities show no edema. neuro grossly intact  ECG     This encounter was created in error - please disregard.

## 2012-03-02 ENCOUNTER — Encounter (HOSPITAL_COMMUNITY): Payer: Medicare Other

## 2012-03-05 ENCOUNTER — Encounter (HOSPITAL_COMMUNITY)
Admission: RE | Admit: 2012-03-05 | Discharge: 2012-03-05 | Disposition: A | Discharge status: Home / Self Care | Payer: Medicare Other | Source: Ambulatory Visit | Attending: Cardiology | Admitting: Cardiology

## 2012-03-05 ENCOUNTER — Ambulatory Visit (INDEPENDENT_AMBULATORY_CARE_PROVIDER_SITE_OTHER): Payer: Self-pay | Admitting: Thoracic Surgery (Cardiothoracic Vascular Surgery)

## 2012-03-05 ENCOUNTER — Encounter: Payer: Self-pay | Admitting: Thoracic Surgery (Cardiothoracic Vascular Surgery)

## 2012-03-05 ENCOUNTER — Encounter (HOSPITAL_COMMUNITY): Payer: Medicare Other

## 2012-03-05 VITALS — BP 137/72 | HR 52 | Resp 18 | Ht 69.0 in | Wt 194.0 lb

## 2012-03-05 DIAGNOSIS — I5043 Acute on chronic combined systolic (congestive) and diastolic (congestive) heart failure: Secondary | ICD-10-CM | POA: Diagnosis not present

## 2012-03-05 DIAGNOSIS — I359 Nonrheumatic aortic valve disorder, unspecified: Secondary | ICD-10-CM | POA: Diagnosis not present

## 2012-03-05 DIAGNOSIS — Z954 Presence of other heart-valve replacement: Secondary | ICD-10-CM | POA: Diagnosis not present

## 2012-03-05 DIAGNOSIS — Z5189 Encounter for other specified aftercare: Secondary | ICD-10-CM | POA: Diagnosis not present

## 2012-03-05 DIAGNOSIS — Z953 Presence of xenogenic heart valve: Secondary | ICD-10-CM

## 2012-03-05 DIAGNOSIS — Z952 Presence of prosthetic heart valve: Secondary | ICD-10-CM

## 2012-03-05 DIAGNOSIS — I350 Nonrheumatic aortic (valve) stenosis: Secondary | ICD-10-CM

## 2012-03-05 NOTE — Patient Instructions (Signed)
The patient should continue to avoid any heavy lifting or strenuous use of arms or shoulders for at least a total of three months from the time of surgery.  Otherwise he has no significant restrictions.

## 2012-03-05 NOTE — Progress Notes (Signed)
[address]            [place] [address]          [phone]     CARDIOTHORACIC SURGERY OFFICE NOTE  Referring Provider is Cassell Clement, MD Primary Cardiologist is [name], MD   HPI:  Patient returns for followup status post redo aortic valve replacement on 12/06/2011. He was last seen here in the office on 01/02/2012. Since then he has continued to recover uneventfully. He states that both he and his wife developed upper respiratory tract infections a few weeks ago. These symptoms have resolved but the patient has not resumed going to the cardiac rehabilitation program. He feels that he has recovered and he did not enjoy the cardiac rehabilitation program. He has minimal residual soreness in his chest. He denies exertional shortness of breath. He states that he still doesn't have very much energy and he just doesn't seem to feel like doing much. He's not having fevers or chills. The remainder of his review of systems unremarkable.   Current Outpatient Prescriptions  Medication Sig Dispense Refill  . aspirin 325 MG EC tablet Take 1 tablet (325 mg total) by mouth daily.  30 tablet    . carbidopa-levodopa (SINEMET) 25-100 MG per tablet Take 1 tablet by mouth 3 (three) times daily.       . Cholecalciferol (VITAMIN D) 2000 UNITS CAPS Take 1 capsule by mouth daily.      . ferrous gluconate (FERGON) 324 MG tablet Take 324 mg by mouth 2 (two) times daily with a meal.       . finasteride (PROSCAR) 5 MG tablet Take 5 mg by mouth daily.       . fish oil-omega-3 fatty acids 1000 MG capsule Take 1 g by mouth 2 (two) times daily.       Marland Kitchen gabapentin (NEURONTIN) 400 MG capsule Take 400 mg by mouth 2 (two) times daily.      Marland Kitchen HYDROcodone-acetaminophen (NORCO/VICODIN) 5-325 MG per tablet Take 1 tablet by mouth every 8 (eight) hours as needed.      Marland Kitchen losartan (COZAAR) 100 MG tablet Take 1 tablet (100 mg total) by mouth daily.  90 tablet  4  . metoprolol  tartrate (LOPRESSOR) 50 MG tablet Take 1 tablet (50 mg total) by mouth 2 (two) times daily.  180 tablet  4  . pantoprazole (PROTONIX) 40 MG tablet Take 40 mg by mouth daily.      . polyethylene glycol (MIRALAX / GLYCOLAX) packet Take 17 g by mouth daily as needed.      . zolpidem (AMBIEN) 10 MG tablet Take 5 mg by mouth at bedtime as needed. For sleep          Physical Exam:   BP 137/72  Pulse 52  Resp 18  Ht 5\' 9"  (1.753 m)  Wt 194 lb (87.998 kg)  BMI 28.65 kg/m2  SpO2 98%  General:  Well-appearing  Chest:   Clear to auscultation with symmetrical breath sounds  CV:   Regular rate and rhythm with grade 2/6 systolic murmur heard best at the right sternal border  Incisions:  Sternotomy is healed completely and the sternum is stable on palpation  Abdomen:  Soft and nontender  Extremities:  Warm and well-perfused  Diagnostic Tests:  Transthoracic Echocardiography  Patient: Yehonatan, [place] MR #: [id number] Study Date: 12/30/2011 Gender: M Age: 67  Height: 172.7cm Weight: 82.1kg BSA: 1.59m^2 Pt. Status: Room:  ATTENDING Perlie Gold, Burman Freestone, Scott REFERRING Alben Spittle, Scott SONOGRAPHER Aida Raider, RDCS PERFORMING [facility], [facility] cc:  ------------------------------------------------------------ LV EF: 60% - 65%  ------------------------------------------------------------ Indications: 424.1 Aortic valve disorders.  ------------------------------------------------------------ History: PMH: Acquired from the patient and from the patient's chart. PMH: History of Severe Aortic Stenosis. Obstructive Sleep Apnea. Syncope. Risk factors: Former tobacco use. Hypertension. Dyslipidemia.  ------------------------------------------------------------ Study Conclusions  - Left ventricle: The cavity size was normal. Wall thickness was increased in a pattern of moderate LVH. Systolic function was normal. The estimated ejection fraction  was in the range of 60% to 65%. Wall motion was normal; there were no regional wall motion abnormalities. Features are consistent with a pseudonormal left ventricular filling pattern, with concomitant abnormal relaxation and increased filling pressure (grade 2 diastolic dysfunction). - Aortic valve: A bioprosthesis was present. Mild regurgitation. - Mitral valve: Calcified annulus. Mildly thickened leaflets . - Left atrium: The atrium was mildly dilated. - Right atrium: The atrium was mildly dilated.  ------------------------------------------------------------ Labs, prior tests, procedures, and surgery: Aortic Valve Replacement ( #25 mm Edwards Perimount Bovine Pericardial Tissue Valve). Redo Aortic Valve Replacement-2013 ( # 23 mm [facility] Ease Pericardial Tissue Valve). Transthoracic echocardiography. M-mode, complete 2D, spectral Doppler, and color Doppler. Height: Height: 172.7cm. Height: 68in. Weight: Weight: 82.1kg. Weight: 180.6lb. Body mass index: BMI: 27.5kg/m^2. Body surface area: BSA: 1.70m^2. Blood pressure: 132/78. Patient status: Outpatient. Location: [place] [facility]  ------------------------------------------------------------  ------------------------------------------------------------ Left ventricle: The cavity size was normal. Wall thickness was increased in a pattern of moderate LVH. Systolic function was normal. The estimated ejection fraction was in the range of 60% to 65%. Wall motion was normal; there were no regional wall motion abnormalities. Features are consistent with a pseudonormal left ventricular filling pattern, with concomitant abnormal relaxation and increased filling pressure (grade 2 diastolic dysfunction).  ------------------------------------------------------------ Aortic valve: A bioprosthesis was present. Mobility was not restricted. Doppler: Mild regurgitation. VTI ratio of LVOT to aortic valve: 0.48. Peak velocity ratio of  LVOT to aortic valve: 0.41. Mean gradient: 19mm Hg (S). Peak gradient: 34mm Hg (S).  ------------------------------------------------------------ Aorta: Aortic root: The aortic root was mildly dilated.  ------------------------------------------------------------ Mitral valve: Calcified annulus. Mildly thickened leaflets . Mobility was not restricted. Doppler: Transvalvular velocity was within the normal range. There was no evidence for stenosis. Trivial regurgitation. Peak gradient: 4mm Hg (D).  ------------------------------------------------------------ Left atrium: The atrium was mildly dilated.  ------------------------------------------------------------ Right ventricle: The cavity size was normal. Systolic function was normal.  ------------------------------------------------------------ Pulmonic valve: Doppler: Transvalvular velocity was within the normal range. There was no evidence for stenosis.  ------------------------------------------------------------ Tricuspid valve: Structurally normal valve. Doppler: Transvalvular velocity was within the normal range. Mild regurgitation.  ------------------------------------------------------------ Pulmonary artery: Systolic pressure was within the normal range.  ------------------------------------------------------------ Right atrium: The atrium was mildly dilated.  ------------------------------------------------------------ Pericardium: There was no pericardial effusion.  ------------------------------------------------------------ Systemic veins: Inferior vena cava: The vessel was normal in size.  ------------------------------------------------------------  2D measurements Normal Doppler Normal Left ventricle measurements LVID ED, 36.4 mm 43-52 Main pulmonary chord, artery PLAX Pressure, S 22 mm =30 LVID ES, 23.1 mm 23-38 Hg chord, Left ventricle PLAX Ea, lat 13.5 cm/ ------- FS, 37 % >29 ann, tiss  s chord, DP PLAX E/Ea, lat 7.56 ------- LVPW, ED 12.79 mm ------ ann, tiss IVS/LVPW 1.14 <1.3 DP ratio, ED Ea, med 5.95 cm/ ------- Ventricular septum ann, tiss s IVS, ED 14.62 mm ------ DP  Aorta E/Ea, med 17.14 ------- Root 38 mm ------ ann, tiss diam, ED DP Left atrium LVOT AP dim 49 mm ------ Peak vel, S 118 cm/ ------- AP dim 2.5 cm/m^2 <2.2 s index VTI, S 26.5 cm ------- Peak 6 mm ------- gradient, S Hg Aortic valve Peak vel, S 290 cm/ ------- s Mean vel, S 204 cm/ ------- s VTI, S 55 cm ------- Mean 19 mm ------- gradient, S Hg Peak 34 mm ------- gradient, S Hg VTI ratio 0.48 ------- LVOT/AV Peak vel 0.41 ------- ratio, LVOT/AV Regurg PHT 394 ms ------- Mitral valve Peak E vel 102 cm/ ------- s Peak A vel 69.6 cm/ ------- s Deceleratio 232 ms 150-230 n time Peak 4 mm ------- gradient, D Hg Peak E/A 1.5 ------- ratio Tricuspid valve Regurg peak 206 cm/ ------- vel s Peak RV-RA 17 mm ------- gradient, S Hg Systemic veins Estimated 5 mm ------- CVP Hg Right ventricle Pressure, S 22 mm <30 Hg Sa vel, lat 12.4 cm/ ------- ann, tiss s DP  ------------------------------------------------------------ Prepared and Electronically Authenticated by  [name] 2013-11-22T16:40:20.253    Impression:  Patient is doing well nearly 3 months status post redo aortic valve replacement.  Plan:  I've encouraged patient to continue in the cardiac rehabilitation program or otherwise make an effort to resume a more active physical lifestyle. All of his questions been addressed. He will continue to followup with Dr. Jens Som and in the future he will call and return to see [place] only as needed.   Salvatore Decent. Cornelius Moras, MD 03/05/2012 10:42 AM

## 2012-03-07 ENCOUNTER — Encounter (HOSPITAL_COMMUNITY): Payer: Medicare Other

## 2012-03-09 ENCOUNTER — Encounter (HOSPITAL_COMMUNITY): Payer: Medicare Other

## 2012-03-09 ENCOUNTER — Encounter (HOSPITAL_COMMUNITY)
Admission: RE | Admit: 2012-03-09 | Discharge: 2012-03-09 | Disposition: A | Discharge status: Home / Self Care | Payer: Medicare Other | Source: Ambulatory Visit | Attending: Cardiology | Admitting: Cardiology

## 2012-03-09 DIAGNOSIS — Z954 Presence of other heart-valve replacement: Secondary | ICD-10-CM | POA: Diagnosis not present

## 2012-03-09 DIAGNOSIS — Z5189 Encounter for other specified aftercare: Secondary | ICD-10-CM | POA: Diagnosis not present

## 2012-03-09 DIAGNOSIS — I359 Nonrheumatic aortic valve disorder, unspecified: Secondary | ICD-10-CM | POA: Diagnosis not present

## 2012-03-09 DIAGNOSIS — I5043 Acute on chronic combined systolic (congestive) and diastolic (congestive) heart failure: Secondary | ICD-10-CM | POA: Diagnosis not present

## 2012-03-12 ENCOUNTER — Encounter (HOSPITAL_COMMUNITY): Payer: Medicare Other

## 2012-03-12 ENCOUNTER — Encounter (HOSPITAL_COMMUNITY)
Admission: RE | Admit: 2012-03-12 | Discharge: 2012-03-12 | Disposition: A | Discharge status: Home / Self Care | Payer: Medicare Other | Source: Ambulatory Visit | Attending: Internal Medicine | Admitting: Internal Medicine

## 2012-03-12 DIAGNOSIS — I359 Nonrheumatic aortic valve disorder, unspecified: Secondary | ICD-10-CM | POA: Diagnosis not present

## 2012-03-12 DIAGNOSIS — Z5189 Encounter for other specified aftercare: Secondary | ICD-10-CM | POA: Insufficient documentation

## 2012-03-12 DIAGNOSIS — Z954 Presence of other heart-valve replacement: Secondary | ICD-10-CM | POA: Diagnosis not present

## 2012-03-12 DIAGNOSIS — I5023 Acute on chronic systolic (congestive) heart failure: Secondary | ICD-10-CM | POA: Diagnosis not present

## 2012-03-14 ENCOUNTER — Encounter (HOSPITAL_COMMUNITY): Payer: Medicare Other

## 2012-03-14 ENCOUNTER — Encounter (HOSPITAL_COMMUNITY)
Admission: RE | Admit: 2012-03-14 | Discharge: 2012-03-14 | Disposition: A | Discharge status: Home / Self Care | Payer: Medicare Other | Source: Ambulatory Visit | Attending: Cardiology | Admitting: Cardiology

## 2012-03-16 ENCOUNTER — Encounter (HOSPITAL_COMMUNITY)
Admission: RE | Admit: 2012-03-16 | Discharge: 2012-03-16 | Disposition: A | Discharge status: Home / Self Care | Payer: Medicare Other | Source: Ambulatory Visit | Attending: Cardiology | Admitting: Cardiology

## 2012-03-16 ENCOUNTER — Encounter (HOSPITAL_COMMUNITY): Payer: Medicare Other

## 2012-03-19 ENCOUNTER — Encounter (HOSPITAL_COMMUNITY): Payer: Medicare Other

## 2012-03-19 ENCOUNTER — Encounter (HOSPITAL_COMMUNITY)
Admission: RE | Admit: 2012-03-19 | Discharge: 2012-03-19 | Disposition: A | Discharge status: Home / Self Care | Payer: Medicare Other | Source: Ambulatory Visit | Attending: Cardiology | Admitting: Cardiology

## 2012-03-21 ENCOUNTER — Encounter (HOSPITAL_COMMUNITY): Payer: Medicare Other

## 2012-03-23 ENCOUNTER — Encounter (HOSPITAL_COMMUNITY): Payer: Medicare Other

## 2012-03-24 ENCOUNTER — Other Ambulatory Visit: Payer: Self-pay

## 2012-03-26 ENCOUNTER — Encounter (HOSPITAL_COMMUNITY): Payer: Medicare Other

## 2012-03-26 ENCOUNTER — Encounter (HOSPITAL_COMMUNITY)
Admission: RE | Admit: 2012-03-26 | Discharge: 2012-03-26 | Disposition: A | Discharge status: Home / Self Care | Payer: Medicare Other | Source: Ambulatory Visit | Attending: Cardiology | Admitting: Cardiology

## 2012-03-28 ENCOUNTER — Encounter (HOSPITAL_COMMUNITY)
Admission: RE | Admit: 2012-03-28 | Discharge: 2012-03-28 | Disposition: A | Discharge status: Home / Self Care | Payer: Medicare Other | Source: Ambulatory Visit | Attending: Cardiology | Admitting: Cardiology

## 2012-03-28 ENCOUNTER — Encounter (HOSPITAL_COMMUNITY): Payer: Medicare Other

## 2012-03-29 ENCOUNTER — Encounter: Payer: Self-pay | Admitting: Cardiology

## 2012-03-29 ENCOUNTER — Ambulatory Visit (INDEPENDENT_AMBULATORY_CARE_PROVIDER_SITE_OTHER): Payer: Medicare Other | Admitting: Cardiology

## 2012-03-29 VITALS — BP 136/77 | HR 59 | Wt 205.0 lb

## 2012-03-29 DIAGNOSIS — Z952 Presence of prosthetic heart valve: Secondary | ICD-10-CM

## 2012-03-29 DIAGNOSIS — I10 Essential (primary) hypertension: Secondary | ICD-10-CM

## 2012-03-29 DIAGNOSIS — E785 Hyperlipidemia, unspecified: Secondary | ICD-10-CM

## 2012-03-29 NOTE — Patient Instructions (Addendum)
Your physician wants you to follow-up in: November 2014 WITH DR Shelda Pal will receive a reminder letter in the mail two months in advance. If you don't receive a letter, please call our office to schedule the follow-up appointment.

## 2012-03-29 NOTE — Assessment & Plan Note (Signed)
Continue SBE prophylaxis. Repeat echocardiogram November 2014.

## 2012-03-29 NOTE — Assessment & Plan Note (Signed)
Blood pressure controlled. Continue present medications. 

## 2012-03-29 NOTE — Assessment & Plan Note (Signed)
Management per primary care. 

## 2012-03-29 NOTE — Progress Notes (Signed)
HPI: Pleasant male for fu of AVR. Previous AVR in 2006 with a porcine valve. The patient had a syncopal episode in the setting of upper GI bleed in July 2013 without identifiable source. He was then admitted to the hospital 10/21-11/1 after suffering another syncopal episode. Echocardiogram 11/29/11: Moderate LVH, EF 60-65%, grade 1 diastolic dysfunction, critical aortic stenosis with a mean gradient of 80 mmHg. LHC 11/28/11: Normal coronary arteries, severe aortic stenosis. Patient underwent redo aortic valve replacement with a pericardial tissue valve 12/06/11 with Dr. Tressie Stalker. Followup echocardiogram in November of 2013 showed normal LV function, grade 2 diastolic dysfunction, bioprosthetic aortic valve with mild aortic insufficiency and mean gradient 19 mm of mercury. Mild biatrial enlargement. Since he was last seen in Dec 2013, the patient denies any dyspnea on exertion, orthopnea, PND, pedal edema, palpitations, syncope or chest pain.   Current Outpatient Prescriptions  Medication Sig Dispense Refill  . aspirin 325 MG EC tablet Take 1 tablet (325 mg total) by mouth daily.  30 tablet    . carbidopa-levodopa (SINEMET) 25-100 MG per tablet Take 1 tablet by mouth 3 (three) times daily.       . Cholecalciferol (VITAMIN D) 2000 UNITS CAPS Take 1 capsule by mouth daily.      . ferrous gluconate (FERGON) 324 MG tablet Take 324 mg by mouth 2 (two) times daily with a meal.       . finasteride (PROSCAR) 5 MG tablet Take 5 mg by mouth daily.       . fish oil-omega-3 fatty acids 1000 MG capsule Take 1 g by mouth 2 (two) times daily.       Marland Kitchen gabapentin (NEURONTIN) 400 MG capsule Take 400 mg by mouth 2 (two) times daily.      Marland Kitchen HYDROcodone-acetaminophen (NORCO/VICODIN) 5-325 MG per tablet Take 1 tablet by mouth every 8 (eight) hours as needed.      Marland Kitchen losartan (COZAAR) 100 MG tablet Take 1 tablet (100 mg total) by mouth daily.  90 tablet  4  . metoprolol tartrate (LOPRESSOR) 50 MG tablet Take 1 tablet  (50 mg total) by mouth 2 (two) times daily.  180 tablet  4  . pantoprazole (PROTONIX) 40 MG tablet Take 40 mg by mouth daily.      . polyethylene glycol (MIRALAX / GLYCOLAX) packet Take 17 g by mouth daily as needed.      . zolpidem (AMBIEN) 10 MG tablet Take 5 mg by mouth at bedtime as needed. For sleep       No current facility-administered medications for this visit.     Past Medical History  Diagnosis Date  . HTN (hypertension)   . GERD (gastroesophageal reflux disease)     bravo pH study 2008  . Hyperlipidemia   . Hemorrhoids     external and internal  . Insomnia   . Allergic rhinitis   . OA (osteoarthritis)   . Hiatal hernia   . BPH (benign prostatic hypertrophy)   . Chronic cough   . Anemia 2009  . Diverticulosis of colon     on colonoscopy 2008  . OSA (obstructive sleep apnea)     USES CPAP AS NEEDED  . Headache   . IBS (irritable bowel syndrome)   . Periodontitis     chronic with bone loss  . Gout   . Asthma   . Depression     pt denies  . Cataract   . Chronic interstitial cystitis   . Aortic stenosis  a. s/p porcine AVR 2006;  b. s/p redo tissue AVR 12/2011 (Dr. Cornelius Moras) - preAVR LHC with no CAD  . H/O aortic valve replacement with porcine valve     2006, 2013  . Diabetes mellitus     type 2  . S/P aortic valve replacement with bioprosthetic valve 12/06/2011    Redo AVR using 23 mm [facility] Ease pericardial tissue valve  . Hx of echocardiogram     a. Echo  (post AVR) 12/2011:  mod LVH, EF 60-65%, Gr 2 diast dysfn, mild AI, AVR ok (mean gradient 19 mmHg), MAC, mild BAE    Past Surgical History  Procedure Laterality Date  . Aortic valve replacement  10/13/2004    25mm Edwards Perimount pericardial tissue valve  . Nasal septoplasty w/ turbinoplasty    . Hernia repair    . Right knee arthroscopy    . Bunionectomy      right  . Cataract extraction  2009    &   2012    BILATERAL  . Esophagogastroduodenoscopy  08/30/2011    Procedure:  ESOPHAGOGASTRODUODENOSCOPY (EGD);  Surgeon: Louis Meckel, MD;  Location[facility] ENDOSCOPY;  Service: Endoscopy;  Laterality: N/A;  Rm 3005   . Colonoscopy  08/31/2011    Procedure: COLONOSCOPY;  Surgeon: Louis Meckel, MD;  Location: [facility] ENDOSCOPY;  Service: Endoscopy;  Laterality: N/A;  . Refractive surgery      bilateral  . Aortic valve replacement  12/06/2011    Procedure: REDO AORTIC VALVE REPLACEMENT (AVR);  Surgeon: Purcell Nails, MD;  Location: [facility] OR;  Service: Open Heart Surgery;  Laterality: N/A;    History   Social History  . Marital Status: Married    Spouse Name: N/A    Number of Children: 0  . Years of Education: N/A   Occupational History  . retired    Social History Main Topics  . Smoking status: Former Smoker    Quit date: 09/30/1971  . Smokeless tobacco: Never Used  . Alcohol Use: No  . Drug Use: No  . Sexually Active: Not Currently   Other Topics Concern  . Not on file   Social History Narrative  . No narrative on file    ROS: no fevers or chills, productive cough, hemoptysis, dysphasia, odynophagia, melena, hematochezia, dysuria, hematuria, rash, seizure activity, orthopnea, PND, pedal edema, claudication. Remaining systems are negative.  Physical Exam: Well-developed well-nourished in no acute distress.  Skin is warm and dry.  HEENT is normal.  Neck is supple.  Chest is clear to auscultation with normal expansion.  Cardiovascular exam is regular rate and rhythm. 2/6 systolic murmur left sternal border. No diastolic murmur. Abdominal exam nontender or distended. No masses palpated. Extremities show no edema. neuro grossly intact

## 2012-03-30 ENCOUNTER — Encounter (HOSPITAL_COMMUNITY): Payer: Medicare Other

## 2012-03-30 ENCOUNTER — Encounter (HOSPITAL_COMMUNITY)
Admission: RE | Admit: 2012-03-30 | Discharge: 2012-03-30 | Disposition: A | Discharge status: Home / Self Care | Payer: Medicare Other | Source: Ambulatory Visit | Attending: Cardiology | Admitting: Cardiology

## 2012-04-02 ENCOUNTER — Encounter (HOSPITAL_COMMUNITY)
Admission: RE | Admit: 2012-04-02 | Discharge: 2012-04-02 | Disposition: A | Discharge status: Home / Self Care | Payer: Medicare Other | Source: Ambulatory Visit | Attending: Cardiology | Admitting: Cardiology

## 2012-04-02 ENCOUNTER — Encounter (HOSPITAL_COMMUNITY): Payer: Medicare Other

## 2012-04-04 ENCOUNTER — Encounter (HOSPITAL_COMMUNITY): Payer: Medicare Other

## 2012-04-04 ENCOUNTER — Encounter (HOSPITAL_COMMUNITY)
Admission: RE | Admit: 2012-04-04 | Discharge: 2012-04-04 | Disposition: A | Discharge status: Home / Self Care | Payer: Medicare Other | Source: Ambulatory Visit | Attending: Cardiology | Admitting: Cardiology

## 2012-04-06 ENCOUNTER — Encounter (HOSPITAL_COMMUNITY)
Admission: RE | Admit: 2012-04-06 | Discharge: 2012-04-06 | Disposition: A | Discharge status: Home / Self Care | Payer: Medicare Other | Source: Ambulatory Visit | Attending: Cardiology | Admitting: Cardiology

## 2012-04-06 ENCOUNTER — Encounter (HOSPITAL_COMMUNITY): Payer: Medicare Other

## 2012-04-09 ENCOUNTER — Encounter (HOSPITAL_COMMUNITY): Payer: Medicare Other

## 2012-04-11 ENCOUNTER — Encounter (HOSPITAL_COMMUNITY): Payer: Medicare Other

## 2012-04-11 ENCOUNTER — Encounter (HOSPITAL_COMMUNITY)
Admission: RE | Admit: 2012-04-11 | Discharge: 2012-04-11 | Disposition: A | Discharge status: Home / Self Care | Payer: Medicare Other | Source: Ambulatory Visit | Attending: Internal Medicine | Admitting: Internal Medicine

## 2012-04-11 DIAGNOSIS — I5023 Acute on chronic systolic (congestive) heart failure: Secondary | ICD-10-CM | POA: Diagnosis not present

## 2012-04-11 DIAGNOSIS — Z954 Presence of other heart-valve replacement: Secondary | ICD-10-CM | POA: Diagnosis not present

## 2012-04-11 DIAGNOSIS — Z79899 Other long term (current) drug therapy: Secondary | ICD-10-CM | POA: Insufficient documentation

## 2012-04-11 DIAGNOSIS — Z5189 Encounter for other specified aftercare: Secondary | ICD-10-CM | POA: Diagnosis not present

## 2012-04-11 DIAGNOSIS — I359 Nonrheumatic aortic valve disorder, unspecified: Secondary | ICD-10-CM | POA: Diagnosis not present

## 2012-04-11 LAB — GLUCOSE, CAPILLARY: Glucose-Capillary: 79 mg/dL (ref 70–99)

## 2012-04-11 NOTE — Progress Notes (Signed)
[name] 67 y.o. male Nutrition Note Spoke with pt.  Nutrition Plan and Nutrition Survey goals reviewed with pt. Pt is following Step 2 of the Therapeutic Lifestyle Changes diet. Per nutrition screen, pt wants to lose wt. Pt states he "has not really been trying to lose wt." Pt reports he does not "really feel like doing anything." This Clinical research associate asked pt if he is depressed. Pt resistant to "that label of depression." Depression and heart disease briefly discussed. This Clinical research associate encouraged pt to seek help if his lack of interest in his usual activities persists. Pt is a diet-controlled diabetic. Last A1c indicates blood glucose well-controlled. Pt checks his CBG's weekly on Wednesday and sends CBG's to the [place] [facility]. This Clinical research associate went over Diabetes Education test results. Pt expressed understanding of the information reviewed. Pt aware of nutrition education classes offered and is unable to attend nutrition classes.  Nutrition Diagnosis Food-and nutrition-related knowledge deficit related to lack of exposure to information as related to diagnosis of: ? CVD ? DM (A1c 4.3) Overweight related to excessive energy intake as evidenced by a BMI of 29.5  Nutrition RX/ Estimated Daily Nutrition Needs for: wt loss  1450-1950 Kcal, 40-50 gm fat, 9-13 gm sat fat, 1.4-2.0 gm trans-fat, <1500 mg sodium, 175-250 gm CHO   Nutrition Intervention   Pt's individual nutrition plan reviewed with pt.   Benefits of adopting Therapeutic Lifestyle Changes discussed when Medficts reviewed.   Pt to attend the Portion Distortion class - met 04/11/12   Pt given handouts for: ? Nutrition I class ? Nutrition II class    Continue client-centered nutrition education by RD, as part of interdisciplinary care. Goal(s)   Pt to identify food quantities necessary to achieve: ? wt loss to a goal wt of 170-182 lb (77.1-82.5 kg) at graduation from cardiac rehab.  Monitor and Evaluate progress toward nutrition goal with  team. Nutrition Risk: Change to Moderate

## 2012-04-13 ENCOUNTER — Encounter (HOSPITAL_COMMUNITY): Payer: Medicare Other

## 2012-04-16 ENCOUNTER — Encounter (HOSPITAL_COMMUNITY): Payer: Medicare Other

## 2012-04-18 ENCOUNTER — Emergency Department (HOSPITAL_COMMUNITY)
Admission: EM | Admit: 2012-04-18 | Discharge: 2012-04-19 | Disposition: A | Discharge status: Home / Self Care | Payer: Medicare Other | Attending: Emergency Medicine | Admitting: Emergency Medicine

## 2012-04-18 ENCOUNTER — Encounter (HOSPITAL_COMMUNITY): Payer: Medicare Other

## 2012-04-18 ENCOUNTER — Emergency Department (HOSPITAL_COMMUNITY): Payer: Medicare Other

## 2012-04-18 ENCOUNTER — Encounter (HOSPITAL_COMMUNITY): Payer: Self-pay | Admitting: *Deleted

## 2012-04-18 DIAGNOSIS — Z79899 Other long term (current) drug therapy: Secondary | ICD-10-CM | POA: Insufficient documentation

## 2012-04-18 DIAGNOSIS — G4733 Obstructive sleep apnea (adult) (pediatric): Secondary | ICD-10-CM | POA: Diagnosis not present

## 2012-04-18 DIAGNOSIS — J45909 Unspecified asthma, uncomplicated: Secondary | ICD-10-CM | POA: Insufficient documentation

## 2012-04-18 DIAGNOSIS — K219 Gastro-esophageal reflux disease without esophagitis: Secondary | ICD-10-CM | POA: Diagnosis not present

## 2012-04-18 DIAGNOSIS — R0602 Shortness of breath: Secondary | ICD-10-CM | POA: Insufficient documentation

## 2012-04-18 DIAGNOSIS — Z8669 Personal history of other diseases of the nervous system and sense organs: Secondary | ICD-10-CM | POA: Diagnosis not present

## 2012-04-18 DIAGNOSIS — E7800 Pure hypercholesterolemia, unspecified: Secondary | ICD-10-CM | POA: Insufficient documentation

## 2012-04-18 DIAGNOSIS — Z9889 Other specified postprocedural states: Secondary | ICD-10-CM | POA: Diagnosis not present

## 2012-04-18 DIAGNOSIS — D649 Anemia, unspecified: Secondary | ICD-10-CM | POA: Diagnosis not present

## 2012-04-18 DIAGNOSIS — R0789 Other chest pain: Secondary | ICD-10-CM | POA: Diagnosis not present

## 2012-04-18 DIAGNOSIS — Z87448 Personal history of other diseases of urinary system: Secondary | ICD-10-CM | POA: Insufficient documentation

## 2012-04-18 DIAGNOSIS — Z87891 Personal history of nicotine dependence: Secondary | ICD-10-CM | POA: Diagnosis not present

## 2012-04-18 DIAGNOSIS — Z8719 Personal history of other diseases of the digestive system: Secondary | ICD-10-CM | POA: Insufficient documentation

## 2012-04-18 DIAGNOSIS — R1013 Epigastric pain: Secondary | ICD-10-CM | POA: Diagnosis not present

## 2012-04-18 DIAGNOSIS — Z954 Presence of other heart-valve replacement: Secondary | ICD-10-CM | POA: Insufficient documentation

## 2012-04-18 DIAGNOSIS — Z8709 Personal history of other diseases of the respiratory system: Secondary | ICD-10-CM | POA: Diagnosis not present

## 2012-04-18 DIAGNOSIS — F411 Generalized anxiety disorder: Secondary | ICD-10-CM | POA: Insufficient documentation

## 2012-04-18 DIAGNOSIS — R011 Cardiac murmur, unspecified: Secondary | ICD-10-CM | POA: Insufficient documentation

## 2012-04-18 DIAGNOSIS — G4700 Insomnia, unspecified: Secondary | ICD-10-CM | POA: Insufficient documentation

## 2012-04-18 DIAGNOSIS — Z8739 Personal history of other diseases of the musculoskeletal system and connective tissue: Secondary | ICD-10-CM | POA: Insufficient documentation

## 2012-04-18 DIAGNOSIS — Z8679 Personal history of other diseases of the circulatory system: Secondary | ICD-10-CM | POA: Diagnosis not present

## 2012-04-18 DIAGNOSIS — Z7982 Long term (current) use of aspirin: Secondary | ICD-10-CM | POA: Insufficient documentation

## 2012-04-18 DIAGNOSIS — I10 Essential (primary) hypertension: Secondary | ICD-10-CM | POA: Diagnosis not present

## 2012-04-18 DIAGNOSIS — E785 Hyperlipidemia, unspecified: Secondary | ICD-10-CM | POA: Insufficient documentation

## 2012-04-18 LAB — HEPATIC FUNCTION PANEL
Alkaline Phosphatase: 55 U/L (ref 39–117)
Bilirubin, Direct: 0.1 mg/dL (ref 0.0–0.3)
Indirect Bilirubin: 0.4 mg/dL (ref 0.3–0.9)
Total Bilirubin: 0.5 mg/dL (ref 0.3–1.2)

## 2012-04-18 LAB — CBC
HCT: 38.7 % — ABNORMAL LOW (ref 39.0–52.0)
MCHC: 34.9 g/dL (ref 30.0–36.0)
MCV: 88.8 fL (ref 78.0–100.0)
Platelets: 174 10*3/uL (ref 150–400)
RDW: 14.4 % (ref 11.5–15.5)
WBC: 5.5 10*3/uL (ref 4.0–10.5)

## 2012-04-18 LAB — BASIC METABOLIC PANEL
BUN: 9 mg/dL (ref 6–23)
Creatinine, Ser: 0.65 mg/dL (ref 0.50–1.35)
GFR calc Af Amer: >90 mL/min (ref >90)
GFR calc non Af Amer: >90 mL/min (ref >90)
Potassium: 3.7 mEq/L (ref 3.5–5.1)

## 2012-04-18 LAB — LIPASE, BLOOD: Lipase: 25 U/L (ref 11–59)

## 2012-04-18 LAB — TROPONIN I: Troponin I: <0.3 ng/mL (ref <0.30)

## 2012-04-18 MED ORDER — ASPIRIN 325 MG PO TABS
325.0000 mg | ORAL_TABLET | Freq: Once | ORAL | Status: AC
  Administered 2012-04-18: 325 mg via ORAL
  Filled 2012-04-18: qty 1

## 2012-04-18 MED ORDER — GI COCKTAIL ~~LOC~~
30.0000 mL | Freq: Once | ORAL | Status: AC
  Administered 2012-04-18: 30 mL via ORAL
  Filled 2012-04-18: qty 30

## 2012-04-18 MED ORDER — NITROGLYCERIN 0.4 MG SL SUBL
0.4000 mg | SUBLINGUAL_TABLET | SUBLINGUAL | Status: AC | PRN
  Administered 2012-04-18 (×3): 0.4 mg via SUBLINGUAL

## 2012-04-18 NOTE — ED Provider Notes (Signed)
History     CSN: [id number]  Arrival date & time 04/18/12  1557   First MD Initiated Contact with Patient 04/18/12 1627      Chief Complaint  Patient presents with  . Chest Pain    (Consider location/radiation/quality/duration/timing/severity/associated sxs/prior treatment) Patient is a 67 y.o. male presenting with chest pain. The history is provided by the patient and the spouse.  Chest Pain Pain location:  Substernal area and epigastric Pain quality: pressure   Pain radiates to:  Does not radiate Pain severity:  Severe Onset quality:  Sudden Duration:  28 hours Progression:  Waxing and waning Chronicity:  New Context comment:  Clearing out fridge Relieved by: improved by rest. Exacerbated by: anxiety. Ineffective treatments:  None tried Associated symptoms: shortness of breath   Associated symptoms: no abdominal pain, no anorexia, no back pain, no cough, no diaphoresis (felt flushed), no fatigue, no fever, no headache, no nausea, no numbness, not vomiting and no weakness   Risk factors: high cholesterol and hypertension     Past Medical History  Diagnosis Date  . HTN (hypertension)   . GERD (gastroesophageal reflux disease)     bravo pH study 2008  . Hyperlipidemia   . Hemorrhoids     external and internal  . Insomnia   . Allergic rhinitis   . OA (osteoarthritis)   . Hiatal hernia   . BPH (benign prostatic hypertrophy)   . Chronic cough   . Anemia 2009  . Diverticulosis of colon     on colonoscopy 2008  . OSA (obstructive sleep apnea)     USES CPAP AS NEEDED  . Headache   . IBS (irritable bowel syndrome)   . Periodontitis     chronic with bone loss  . Gout   . Asthma   . Depression     pt denies  . Cataract   . Chronic interstitial cystitis   . Aortic stenosis     a. s/p porcine AVR 2006;  b. s/p redo tissue AVR 12/2011 (Dr. Cornelius Moras) - preAVR LHC with no CAD  . H/O aortic valve replacement with porcine valve     2006, 2013  . Diabetes mellitus      type 2  . S/P aortic valve replacement with bioprosthetic valve 12/06/2011    Redo AVR using 23 mm [facility] Ease pericardial tissue valve  . Hx of echocardiogram     a. Echo  (post AVR) 12/2011:  mod LVH, EF 60-65%, Gr 2 diast dysfn, mild AI, AVR ok (mean gradient 19 mmHg), MAC, mild BAE    Past Surgical History  Procedure Laterality Date  . Aortic valve replacement  10/13/2004    25mm Edwards Perimount pericardial tissue valve  . Nasal septoplasty w/ turbinoplasty    . Hernia repair    . Right knee arthroscopy    . Bunionectomy      right  . Cataract extraction  2009    &   2012    BILATERAL  . Esophagogastroduodenoscopy  08/30/2011    Procedure: ESOPHAGOGASTRODUODENOSCOPY (EGD);  Surgeon: Louis Meckel, MD;  Location: [facility] ENDOSCOPY;  Service: Endoscopy;  Laterality: N/A;  Rm 3005   . Colonoscopy  08/31/2011    Procedure: COLONOSCOPY;  Surgeon: Louis Meckel, MD;  Location: [facility] ENDOSCOPY;  Service: Endoscopy;  Laterality: N/A;  . Refractive surgery      bilateral  . Aortic valve replacement  12/06/2011    Procedure: REDO AORTIC VALVE REPLACEMENT (AVR);  Surgeon: Salvatore Decent  Cornelius Moras, MD;  Location: [facility] OR;  Service: Open Heart Surgery;  Laterality: N/A;    Family History  Problem Relation Age of Onset  . Heart disease Father   . Osteoarthritis Mother   . Hypertension Sister   . Hyperlipidemia      History  Substance Use Topics  . Smoking status: Former Smoker    Quit date: 09/30/1971  . Smokeless tobacco: Never Used  . Alcohol Use: No      Review of Systems  Constitutional: Negative for fever, chills, diaphoresis (felt flushed), activity change, appetite change and fatigue.  HENT: Negative for congestion, sore throat, facial swelling, rhinorrhea, drooling, neck pain and voice change.   Respiratory: Positive for shortness of breath. Negative for cough and stridor.   Cardiovascular: Positive for chest pain.  Gastrointestinal: Negative for nausea, vomiting, abdominal  pain, diarrhea, abdominal distention and anorexia.  Endocrine: Negative for polydipsia and polyuria.  Genitourinary: Negative for dysuria, urgency, frequency and decreased urine volume.  Musculoskeletal: Negative for back pain and gait problem.  Skin: Negative for color change and wound.  Neurological: Negative for facial asymmetry, weakness, numbness and headaches.  Hematological: Does not bruise/bleed easily.  Psychiatric/Behavioral: Negative for confusion and agitation.    Allergies  Codeine; Codeine phosphate; Dutasteride; and Feraheme  Home Medications   Current Outpatient Rx  Name  Route  Sig  Dispense  Refill  . aspirin 325 MG EC tablet   Oral   Take 1 tablet (325 mg total) by mouth daily.   30 tablet      . carbidopa-levodopa (SINEMET) 25-100 MG per tablet   Oral   Take 2 tablets by mouth daily with breakfast.          . Cholecalciferol (VITAMIN D) 2000 UNITS CAPS   Oral   Take 1 capsule by mouth daily.         Marland Kitchen docusate sodium (COLACE) 100 MG capsule   Oral   Take 200 mg by mouth at bedtime.         . ferrous gluconate (FERGON) 324 MG tablet   Oral   Take 324 mg by mouth 2 (two) times daily with a meal.          . fish oil-omega-3 fatty acids 1000 MG capsule   Oral   Take 1 g by mouth 2 (two) times daily.          Marland Kitchen gabapentin (NEURONTIN) 400 MG capsule   Oral   Take 400 mg by mouth 2 (two) times daily.         Marland Kitchen HYDROcodone-acetaminophen (NORCO/VICODIN) 5-325 MG per tablet   Oral   Take 1 tablet by mouth 3 (three) times daily.          Marland Kitchen losartan (COZAAR) 100 MG tablet   Oral   Take 1 tablet (100 mg total) by mouth daily.   90 tablet   4   . metoprolol tartrate (LOPRESSOR) 50 MG tablet   Oral   Take 1 tablet (50 mg total) by mouth 2 (two) times daily.   180 tablet   4   . pantoprazole (PROTONIX) 40 MG tablet   Oral   Take 40 mg by mouth daily.         Marland Kitchen zolpidem (AMBIEN) 10 MG tablet   Oral   Take 5 mg by mouth at bedtime  as needed. For sleep           BP 168/77  Pulse 69  Temp(Src) 97.4 F (  36.3 C)  Resp 18  SpO2 100%  Physical Exam  Constitutional: He is oriented to person, place, and time. He appears well-developed and well-nourished. No distress.  HENT:  Head: Normocephalic and atraumatic.  Mouth/Throat: No oropharyngeal exudate.  Eyes: Pupils are equal, round, and reactive to light.  Neck: Normal range of motion. Neck supple.  Cardiovascular: Normal rate and regular rhythm.  Exam reveals no gallop and no friction rub.   Murmur heard.  Systolic murmur is present with a grade of 2/6  Pulmonary/Chest: Effort normal and breath sounds normal. No respiratory distress. He has no wheezes. He has no rales.  Abdominal: Soft. Bowel sounds are normal. He exhibits no distension and no mass. There is no tenderness. There is no rebound and no guarding.  Musculoskeletal: Normal range of motion. He exhibits no edema and no tenderness.  Neurological: He is alert and oriented to person, place, and time.  Skin: Skin is warm and dry.  Psychiatric: He has a normal mood and affect.    ED Course  Procedures (including critical care time)  Labs Reviewed  CBC - Abnormal; Notable for the following:    HCT 38.7 (*)    All other components within normal limits  BASIC METABOLIC PANEL - Abnormal; Notable for the following:    Sodium 134 (*)    Glucose, Bld 111 (*)    All other components within normal limits  TROPONIN I  TROPONIN I  HEPATIC FUNCTION PANEL  LIPASE, BLOOD   Dg Chest 2 View  04/18/2012  *RADIOLOGY REPORT*  Clinical Data: Chest pain and hypertension.  CHEST - 2 VIEW  Comparison: PA and lateral chest 01/28/2012.  Findings: The patient is status post aortic valve replacement. There is mild cardiomegaly.  Lungs are clear.  Heart size is normal.  No pneumothorax or pleural fluid.  Thoracic spondylosis noted.  IMPRESSION: No acute finding.   Original Report Authenticated By: Holley Dexter, M.D.       1. Chest pain   2. Abdominal pain      Date: 04/18/2012  Rate: 69  Rhythm: normal sinus rhythm  QRS Axis: left  Intervals: normal  ST/T Wave abnormalities: normal  Conduction Disutrbances:none  Narrative Interpretation:   Old EKG Reviewed: changes noted, new LAD    MDM  Pt is a 67 y.o. male with pertinent PMHX of aortic valve replacement, HTN, HLD, IBS, GERD who presents with chest pain for about 28 hours that began while cleaning out fridge yesterday, described as pressure, substernal/epigastric w/o radiation, and had some associated nausea, flushed sensation earlier, currently w/ slight SOB.  No cough, fever, leg swelling.  Pain worsened when feeling stressed, improved when tries to relax.  On PE, VSS, pt in NAD. Cardiopulm exam benign.  No LE edema, chest ttp, mild epigastric ttp.  EKG w/ new left axis deviation, but otherwise unchanged. Ddx includes ACS, GERD, gastritis, MSK pain, costochondritis, anxiety, pna. Will give ASA, SL NTG. Basic labs, trop, CXR ordered.   First and second troponin negative.  Had spoken to cardiology who recommended placement in CDU after first trop (no longer an option).  [facility] consulted for admission and requests I add LFTs, lipase.  Pt's recent cardiac cath in 10/'13 reviewed which showed no CAD.  Pt's CP resolved after 2 SL NTG, but now having epigastric pain.  GI cocktail will be given.     11:34 PM CP remains resolved, epigastric pain also now resolved after GI cocktail, but now having some RUQ pain.  No Rebound, guarding or murphy's sign. LFT's & lipase nml. I doubt cardiac cause of pain given nml cath, two negative troponin's w' hx of constant pain since yesterday, and migratory nature of pain.  I also doubt acute surgical cause of pain such as SBO, cholecystitis.  I believe pt safe to d/c with close PCP & cardiology f/u.  He will call both tomorrow.   1. Chest pain   2. Abdominal pain      Labs and imaging considered in decision making,  reviewed by myself.  Imaging interpreted by radiology. Pt care discussed with my attending, Dr. Bernette Mayers.         Toy Cookey, MD 04/20/12 [phone]

## 2012-04-18 NOTE — ED Notes (Signed)
Pt reports CP that started last night while he was cleaning out the fridge.  Reports nausea, denies SOB.  Pt does reports being flushed and sweaty when the pain started.  Pt A/O x 4.

## 2012-04-19 ENCOUNTER — Telehealth: Payer: Self-pay | Admitting: Cardiology

## 2012-04-19 ENCOUNTER — Telehealth (HOSPITAL_COMMUNITY): Payer: Self-pay | Admitting: *Deleted
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
## 2012-04-19 NOTE — Telephone Encounter (Signed)
New Problem:    Patient called in after being discharged from the hospital and was told to schedule a follow up with Dr. Jens Som as soon as possible.  Please call back.

## 2012-04-20 ENCOUNTER — Encounter (HOSPITAL_COMMUNITY): Payer: Medicare Other

## 2012-04-20 ENCOUNTER — Telehealth (HOSPITAL_COMMUNITY): Payer: Self-pay | Admitting: Cardiac Rehabilitation

## 2012-04-20 NOTE — Telephone Encounter (Signed)
Message received pt will be absent from cardiac rehab today, went to ED for CP, awaiting MD appt before returning

## 2012-04-21 NOTE — ED Provider Notes (Signed)
I saw and evaluated the patient, reviewed the resident's note and I agree with the findings and plan. Agree with EKG interpretation if present.  Pt with atypical pain, no known CAD, has had neg cath recently. Pain now seems more RUQ. Labs unremarkable, including Trop x 2.   Charles B. Bernette Mayers, MD 04/21/12 [phone]

## 2012-04-23 ENCOUNTER — Encounter (HOSPITAL_COMMUNITY): Payer: Medicare Other

## 2012-04-25 ENCOUNTER — Encounter: Payer: Self-pay | Admitting: Nurse Practitioner

## 2012-04-25 ENCOUNTER — Encounter (HOSPITAL_COMMUNITY): Payer: Medicare Other

## 2012-04-25 ENCOUNTER — Ambulatory Visit (INDEPENDENT_AMBULATORY_CARE_PROVIDER_SITE_OTHER): Payer: Medicare Other | Admitting: Nurse Practitioner

## 2012-04-25 VITALS — BP 130/80 | HR 72 | Ht 69.5 in | Wt 208.1 lb

## 2012-04-25 DIAGNOSIS — R109 Unspecified abdominal pain: Secondary | ICD-10-CM

## 2012-04-25 LAB — URINALYSIS, ROUTINE W REFLEX MICROSCOPIC
Bilirubin Urine: NEGATIVE
Ketones, ur: NEGATIVE
Leukocytes, UA: NEGATIVE
Nitrite: NEGATIVE
Specific Gravity, Urine: 1.025 (ref 1.000–1.030)
Total Protein, Urine: NEGATIVE
Urine Glucose: NEGATIVE
Urobilinogen, UA: 0.2 (ref 0.0–1.0)
pH: 6 (ref 5.0–8.0)

## 2012-04-25 NOTE — Addendum Note (Signed)
Addended by: Rosalio Macadamia on: 04/25/2012 11:14 AM   Modules accepted: Orders
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
## 2012-04-25 NOTE — Patient Instructions (Signed)
We will arrange for a scan on your belly  We are going to check your urine today  See Dr. Jens Som back at your regular time  Get back with your primary care doctor here in the next few months  Call the [facility] office at [phone] if you have any questions, problems or concerns.

## 2012-04-27 ENCOUNTER — Encounter (HOSPITAL_COMMUNITY): Payer: Medicare Other

## 2012-04-30 ENCOUNTER — Encounter (HOSPITAL_COMMUNITY)
Admission: RE | Admit: 2012-04-30 | Discharge: 2012-04-30 | Disposition: A | Discharge status: Home / Self Care | Payer: Medicare Other | Source: Ambulatory Visit | Attending: Cardiology | Admitting: Cardiology

## 2012-04-30 ENCOUNTER — Encounter (HOSPITAL_COMMUNITY): Payer: Medicare Other

## 2012-05-01 ENCOUNTER — Other Ambulatory Visit (HOSPITAL_COMMUNITY): Payer: Self-pay

## 2012-05-01 ENCOUNTER — Ambulatory Visit (HOSPITAL_COMMUNITY)
Admission: RE | Admit: 2012-05-01 | Discharge: 2012-05-01 | Disposition: A | Discharge status: Home / Self Care | Payer: Medicare Other | Source: Ambulatory Visit | Attending: Nurse Practitioner | Admitting: Nurse Practitioner

## 2012-05-01 DIAGNOSIS — R109 Unspecified abdominal pain: Secondary | ICD-10-CM

## 2012-05-02 ENCOUNTER — Encounter (HOSPITAL_COMMUNITY): Payer: Medicare Other

## 2012-05-02 ENCOUNTER — Encounter (HOSPITAL_COMMUNITY)
Admission: RE | Admit: 2012-05-02 | Discharge: 2012-05-02 | Disposition: A | Discharge status: Home / Self Care | Payer: Medicare Other | Source: Ambulatory Visit | Attending: Cardiology | Admitting: Cardiology

## 2012-05-03 ENCOUNTER — Encounter (HOSPITAL_COMMUNITY): Payer: Self-pay

## 2012-05-03 ENCOUNTER — Ambulatory Visit (HOSPITAL_COMMUNITY)
Admission: RE | Admit: 2012-05-03 | Discharge: 2012-05-03 | Disposition: A | Discharge status: Home / Self Care | Payer: Medicare Other | Source: Ambulatory Visit | Attending: Nurse Practitioner | Admitting: Nurse Practitioner

## 2012-05-03 DIAGNOSIS — I517 Cardiomegaly: Secondary | ICD-10-CM | POA: Insufficient documentation

## 2012-05-03 DIAGNOSIS — K589 Irritable bowel syndrome without diarrhea: Secondary | ICD-10-CM | POA: Diagnosis not present

## 2012-05-03 DIAGNOSIS — I779 Disorder of arteries and arterioles, unspecified: Secondary | ICD-10-CM | POA: Diagnosis not present

## 2012-05-03 DIAGNOSIS — R197 Diarrhea, unspecified: Secondary | ICD-10-CM | POA: Insufficient documentation

## 2012-05-03 DIAGNOSIS — R109 Unspecified abdominal pain: Secondary | ICD-10-CM | POA: Insufficient documentation

## 2012-05-03 DIAGNOSIS — Q619 Cystic kidney disease, unspecified: Secondary | ICD-10-CM | POA: Insufficient documentation

## 2012-05-03 DIAGNOSIS — N281 Cyst of kidney, acquired: Secondary | ICD-10-CM | POA: Diagnosis not present

## 2012-05-03 DIAGNOSIS — K5730 Diverticulosis of large intestine without perforation or abscess without bleeding: Secondary | ICD-10-CM | POA: Diagnosis not present

## 2012-05-03 DIAGNOSIS — K838 Other specified diseases of biliary tract: Secondary | ICD-10-CM | POA: Diagnosis not present

## 2012-05-03 DIAGNOSIS — K5900 Constipation, unspecified: Secondary | ICD-10-CM | POA: Insufficient documentation

## 2012-05-03 MED ORDER — IOHEXOL 300 MG/ML  SOLN
100.0000 mL | Freq: Once | INTRAMUSCULAR | Status: AC | PRN
  Administered 2012-05-03: 100 mL via INTRAVENOUS

## 2012-05-04 ENCOUNTER — Encounter (HOSPITAL_COMMUNITY): Payer: Medicare Other

## 2012-05-04 ENCOUNTER — Encounter (HOSPITAL_COMMUNITY)
Admission: RE | Admit: 2012-05-04 | Discharge: 2012-05-04 | Disposition: A | Discharge status: Home / Self Care | Payer: Medicare Other | Source: Ambulatory Visit | Attending: Cardiology | Admitting: Cardiology

## 2012-05-07 ENCOUNTER — Encounter (HOSPITAL_COMMUNITY)
Admission: RE | Admit: 2012-05-07 | Discharge: 2012-05-07 | Disposition: A | Discharge status: Home / Self Care | Payer: Medicare Other | Source: Ambulatory Visit | Attending: Cardiology | Admitting: Cardiology

## 2012-05-09 ENCOUNTER — Encounter (HOSPITAL_COMMUNITY)
Admission: RE | Admit: 2012-05-09 | Discharge: 2012-05-09 | Disposition: A | Discharge status: Home / Self Care | Payer: Medicare Other | Source: Ambulatory Visit | Attending: Internal Medicine | Admitting: Internal Medicine

## 2012-05-09 DIAGNOSIS — Z954 Presence of other heart-valve replacement: Secondary | ICD-10-CM | POA: Diagnosis not present

## 2012-05-09 DIAGNOSIS — I5023 Acute on chronic systolic (congestive) heart failure: Secondary | ICD-10-CM | POA: Diagnosis not present

## 2012-05-09 DIAGNOSIS — I359 Nonrheumatic aortic valve disorder, unspecified: Secondary | ICD-10-CM | POA: Insufficient documentation

## 2012-05-09 DIAGNOSIS — Z5189 Encounter for other specified aftercare: Secondary | ICD-10-CM | POA: Insufficient documentation

## 2012-05-09 DIAGNOSIS — Z79899 Other long term (current) drug therapy: Secondary | ICD-10-CM | POA: Insufficient documentation

## 2012-05-11 ENCOUNTER — Encounter (HOSPITAL_COMMUNITY)
Admission: RE | Admit: 2012-05-11 | Discharge: 2012-05-11 | Disposition: A | Discharge status: Home / Self Care | Payer: Medicare Other | Source: Ambulatory Visit | Attending: Cardiology | Admitting: Cardiology

## 2012-05-14 ENCOUNTER — Telehealth (HOSPITAL_COMMUNITY): Payer: Self-pay | Admitting: *Deleted

## 2012-05-14 ENCOUNTER — Encounter (HOSPITAL_COMMUNITY): Admission: RE | Admit: 2012-05-14 | Payer: Medicare Other | Source: Ambulatory Visit

## 2012-05-16 ENCOUNTER — Ambulatory Visit (INDEPENDENT_AMBULATORY_CARE_PROVIDER_SITE_OTHER): Payer: Medicare Other | Admitting: *Deleted

## 2012-05-16 ENCOUNTER — Encounter (HOSPITAL_COMMUNITY)
Admission: RE | Admit: 2012-05-16 | Discharge: 2012-05-16 | Disposition: A | Discharge status: Home / Self Care | Payer: Medicare Other | Source: Ambulatory Visit | Attending: Cardiology | Admitting: Cardiology

## 2012-05-16 ENCOUNTER — Telehealth: Payer: Self-pay | Admitting: *Deleted

## 2012-05-16 ENCOUNTER — Encounter: Payer: Self-pay | Admitting: *Deleted

## 2012-05-16 VITALS — BP 150/86 | HR 58 | Wt 208.8 lb

## 2012-05-16 DIAGNOSIS — I10 Essential (primary) hypertension: Secondary | ICD-10-CM

## 2012-05-16 DIAGNOSIS — I119 Hypertensive heart disease without heart failure: Secondary | ICD-10-CM

## 2012-05-16 NOTE — Telephone Encounter (Signed)
HCTZ 12.5 mg po daily; bmet one week [name]

## 2012-05-16 NOTE — Progress Notes (Signed)
Entry blood pressure 158/80 today.  Patient exercised at cardiac rehab today without complaints. Exertional blood pressure in the 140's.  Exit blood pressure 136/74. Faxed exercise flow sheets from cardiac rehab to Dr Ludwig Clarks office for review.

## 2012-05-16 NOTE — Progress Notes (Signed)
Patient came in for a blood pressure stating that his blood pressure had been running up at night and in the morning. He is taking his blood pressure before taking his medications. Patient has been doing cardiac rehab and blood pressure have been doing ok per patient. He has been having headaches. Today blood pressure 150/86. Did offer an appointment with Dr Crenshaw in the morning but [facility] Advised to continue same dose of medications and would forward information to Dr Crenshaw for recommendations.  

## 2012-05-16 NOTE — Telephone Encounter (Signed)
Patient came in for a blood pressure stating that his blood pressure had been running up at night and in the morning. He is taking his blood pressure before taking his medications. Patient has been doing cardiac rehab and blood pressure have been doing ok per patient. He has been having headaches. Today blood pressure 150/86. Did offer an appointment with Dr Jens Som in the morning but he has an appointment at the [facility]. Advised to continue same dose of medications and would forward information to Dr Jens Som for recommendations.

## 2012-05-17 ENCOUNTER — Encounter (HOSPITAL_COMMUNITY): Payer: Self-pay | Admitting: Emergency Medicine

## 2012-05-17 ENCOUNTER — Telehealth: Payer: Self-pay | Admitting: Cardiology

## 2012-05-17 ENCOUNTER — Emergency Department (HOSPITAL_COMMUNITY)
Admission: EM | Admit: 2012-05-17 | Discharge: 2012-05-17 | Disposition: A | Discharge status: Home / Self Care | Payer: Medicare Other | Attending: Emergency Medicine | Admitting: Emergency Medicine

## 2012-05-17 ENCOUNTER — Emergency Department (HOSPITAL_COMMUNITY): Payer: Medicare Other

## 2012-05-17 DIAGNOSIS — Z862 Personal history of diseases of the blood and blood-forming organs and certain disorders involving the immune mechanism: Secondary | ICD-10-CM | POA: Diagnosis not present

## 2012-05-17 DIAGNOSIS — Z8639 Personal history of other endocrine, nutritional and metabolic disease: Secondary | ICD-10-CM | POA: Insufficient documentation

## 2012-05-17 DIAGNOSIS — E785 Hyperlipidemia, unspecified: Secondary | ICD-10-CM | POA: Diagnosis not present

## 2012-05-17 DIAGNOSIS — J45909 Unspecified asthma, uncomplicated: Secondary | ICD-10-CM | POA: Diagnosis not present

## 2012-05-17 DIAGNOSIS — Z9989 Dependence on other enabling machines and devices: Secondary | ICD-10-CM | POA: Insufficient documentation

## 2012-05-17 DIAGNOSIS — Z8669 Personal history of other diseases of the nervous system and sense organs: Secondary | ICD-10-CM | POA: Diagnosis not present

## 2012-05-17 DIAGNOSIS — M545 Low back pain, unspecified: Secondary | ICD-10-CM | POA: Diagnosis not present

## 2012-05-17 DIAGNOSIS — Z8709 Personal history of other diseases of the respiratory system: Secondary | ICD-10-CM | POA: Diagnosis not present

## 2012-05-17 DIAGNOSIS — Z8739 Personal history of other diseases of the musculoskeletal system and connective tissue: Secondary | ICD-10-CM | POA: Insufficient documentation

## 2012-05-17 DIAGNOSIS — F3289 Other specified depressive episodes: Secondary | ICD-10-CM | POA: Insufficient documentation

## 2012-05-17 DIAGNOSIS — Z7982 Long term (current) use of aspirin: Secondary | ICD-10-CM | POA: Diagnosis not present

## 2012-05-17 DIAGNOSIS — M549 Dorsalgia, unspecified: Secondary | ICD-10-CM

## 2012-05-17 DIAGNOSIS — Z87448 Personal history of other diseases of urinary system: Secondary | ICD-10-CM | POA: Insufficient documentation

## 2012-05-17 DIAGNOSIS — E119 Type 2 diabetes mellitus without complications: Secondary | ICD-10-CM | POA: Insufficient documentation

## 2012-05-17 DIAGNOSIS — Z8719 Personal history of other diseases of the digestive system: Secondary | ICD-10-CM | POA: Insufficient documentation

## 2012-05-17 DIAGNOSIS — I10 Essential (primary) hypertension: Secondary | ICD-10-CM | POA: Insufficient documentation

## 2012-05-17 DIAGNOSIS — Z8679 Personal history of other diseases of the circulatory system: Secondary | ICD-10-CM | POA: Insufficient documentation

## 2012-05-17 DIAGNOSIS — Z79899 Other long term (current) drug therapy: Secondary | ICD-10-CM | POA: Diagnosis not present

## 2012-05-17 DIAGNOSIS — Z954 Presence of other heart-valve replacement: Secondary | ICD-10-CM | POA: Diagnosis not present

## 2012-05-17 DIAGNOSIS — G4733 Obstructive sleep apnea (adult) (pediatric): Secondary | ICD-10-CM | POA: Insufficient documentation

## 2012-05-17 DIAGNOSIS — F329 Major depressive disorder, single episode, unspecified: Secondary | ICD-10-CM | POA: Insufficient documentation

## 2012-05-17 DIAGNOSIS — K219 Gastro-esophageal reflux disease without esophagitis: Secondary | ICD-10-CM | POA: Insufficient documentation

## 2012-05-17 LAB — URINE MICROSCOPIC-ADD ON

## 2012-05-17 LAB — URINALYSIS, ROUTINE W REFLEX MICROSCOPIC
Bilirubin Urine: NEGATIVE
Glucose, UA: NEGATIVE mg/dL
Ketones, ur: NEGATIVE mg/dL
Leukocytes, UA: NEGATIVE
pH: 6.5 (ref 5.0–8.0)

## 2012-05-17 MED ORDER — DIAZEPAM 5 MG/ML IJ SOLN
2.5000 mg | Freq: Once | INTRAMUSCULAR | Status: AC
  Administered 2012-05-17: 2.5 mg via INTRAMUSCULAR
  Filled 2012-05-17: qty 2

## 2012-05-17 MED ORDER — CYCLOBENZAPRINE HCL 10 MG PO TABS: 10.0000 mg | ORAL_TABLET | Freq: Two times a day (BID) | ORAL | Status: DC | PRN

## 2012-05-17 MED ORDER — NAPROXEN 500 MG PO TABS: 500.0000 mg | ORAL_TABLET | Freq: Two times a day (BID) | ORAL | Status: DC

## 2012-05-17 MED ORDER — HYDROCHLOROTHIAZIDE 12.5 MG PO CAPS: 12.5000 mg | ORAL_CAPSULE | Freq: Every day | ORAL | Status: DC

## 2012-05-17 MED ORDER — KETOROLAC TROMETHAMINE 60 MG/2ML IM SOLN
60.0000 mg | Freq: Once | INTRAMUSCULAR | Status: AC
  Administered 2012-05-17: 60 mg via INTRAMUSCULAR
  Filled 2012-05-17: qty 2

## 2012-05-17 NOTE — Telephone Encounter (Signed)
See previous telephone note. 

## 2012-05-17 NOTE — ED Notes (Signed)
Patient states he started having L lower back pain last night at 2200.   Patient states his pain is sharp when he sits up and dull when he gets up.  Patient denies any other symptoms.   Patient stated he did take pain medication for the pain, but it did not help.

## 2012-05-17 NOTE — Telephone Encounter (Signed)
Will forward to Victorio Palm RN, returning her call

## 2012-05-17 NOTE — ED Provider Notes (Signed)
History     CSN: [id number]  Arrival date & time 05/17/12  1478   First MD Initiated Contact with Patient 05/17/12 0745      Chief Complaint  Patient presents with  . Back Pain    left lower    (Consider location/radiation/quality/duration/timing/severity/associated sxs/prior treatment) HPI Comments: Patient is a 67 year old male with a past medical history of hypertension who presents with gradual onset of lower back pain that started yesterday eveneing. The patient reports working out yesterday evening and developing gradual onset of left lower back pain after the work out. The pain is aching and severe and does not radiate. The pain is constant. Movement makes the pain worse. Nothing makes the pain better. Patient has not tried anything for pain. No associated symptoms. No saddle paresthesias or bladder/bowel incontinence. Patient denies any known injury.      Past Medical History  Diagnosis Date  . HTN (hypertension)   . GERD (gastroesophageal reflux disease)     bravo pH study 2008  . Hyperlipidemia   . Hemorrhoids     external and internal  . Insomnia   . Allergic rhinitis   . OA (osteoarthritis)   . Hiatal hernia   . BPH (benign prostatic hypertrophy)   . Chronic cough   . Anemia 2009  . Diverticulosis of colon     on colonoscopy 2008  . OSA (obstructive sleep apnea)     USES CPAP AS NEEDED  . Headache   . IBS (irritable bowel syndrome)   . Periodontitis     chronic with bone loss  . Gout   . Asthma   . Depression     pt denies  . Cataract   . Chronic interstitial cystitis   . Aortic stenosis     a. s/p porcine AVR 2006;  b. s/p redo tissue AVR 12/2011 (Dr. Cornelius Moras) - preAVR LHC with no CAD  . H/O aortic valve replacement with porcine valve     2006, 2013  . Diabetes mellitus     type 2  . S/P aortic valve replacement with bioprosthetic valve 12/06/2011    Redo AVR using 23 mm [facility] Ease pericardial tissue valve  . Hx of echocardiogram     a.  Echo  (post AVR) 12/2011:  mod LVH, EF 60-65%, Gr 2 diast dysfn, mild AI, AVR ok (mean gradient 19 mmHg), MAC, mild BAE    Past Surgical History  Procedure Laterality Date  . Aortic valve replacement  10/13/2004    25mm [facility] Perimount pericardial tissue valve  . Nasal septoplasty w/ turbinoplasty    . Hernia repair    . Right knee arthroscopy    . Bunionectomy      right  . Cataract extraction  2009    &   2012    BILATERAL  . Esophagogastroduodenoscopy  08/30/2011    Procedure: ESOPHAGOGASTRODUODENOSCOPY (EGD);  Surgeon: Louis Meckel, MD;  Location: [facility] ENDOSCOPY;  Service: Endoscopy;  Laterality: N/A;  Rm 3005   . Colonoscopy  08/31/2011    Procedure: COLONOSCOPY;  Surgeon: Louis Meckel, MD;  Location: [facility] ENDOSCOPY;  Service: Endoscopy;  Laterality: N/A;  . Refractive surgery      bilateral  . Aortic valve replacement  12/06/2011    Procedure: REDO AORTIC VALVE REPLACEMENT (AVR);  Surgeon: Purcell Nails, MD;  Location: [facility] OR;  Service: Open Heart Surgery;  Laterality: N/A;    Family History  Problem Relation Age of Onset  .  Heart disease Father   . Osteoarthritis Mother   . Hypertension Sister   . Hyperlipidemia      History  Substance Use Topics  . Smoking status: Former Smoker    Quit date: 09/30/1971  . Smokeless tobacco: Never Used  . Alcohol Use: No      Review of Systems  Musculoskeletal: Positive for back pain.  All other systems reviewed and are negative.    Allergies  Codeine; Dutasteride; and Feraheme  Home Medications   Current Outpatient Rx  Name  Route  Sig  Dispense  Refill  . aspirin 325 MG EC tablet   Oral   Take 325 mg by mouth at bedtime.         . carbidopa-levodopa (SINEMET) 25-100 MG per tablet   Oral   Take 2 tablets by mouth daily with breakfast.          . Cholecalciferol (VITAMIN D) 2000 UNITS CAPS   Oral   Take 1 capsule by mouth daily.         Marland Kitchen docusate sodium (COLACE) 100 MG capsule   Oral   Take 200 mg  by mouth at bedtime as needed. For constipation         . ferrous gluconate (FERGON) 324 MG tablet   Oral   Take 324 mg by mouth 2 (two) times daily with a meal.          . fish oil-omega-3 fatty acids 1000 MG capsule   Oral   Take 1 g by mouth 2 (two) times daily.          Marland Kitchen gabapentin (NEURONTIN) 800 MG tablet   Oral   Take 800 mg by mouth 3 (three) times daily.         Marland Kitchen HYDROcodone-acetaminophen (NORCO/VICODIN) 5-325 MG per tablet   Oral   Take 1 tablet by mouth 3 (three) times daily.          Marland Kitchen losartan (COZAAR) 100 MG tablet   Oral   Take 100 mg by mouth at bedtime.         . Methenamine-Sodium Salicylate (CYSTEX) 162-162.5 MG TABS   Oral   Take 1 tablet by mouth once.         . metoprolol tartrate (LOPRESSOR) 50 MG tablet   Oral   Take 1 tablet (50 mg total) by mouth 2 (two) times daily.   180 tablet   4   . pantoprazole (PROTONIX) 40 MG tablet   Oral   Take 40 mg by mouth daily as needed. For GERD         . zolpidem (AMBIEN) 10 MG tablet   Oral   Take 5 mg by mouth at bedtime.            BP 119/74  Pulse 53  Temp(Src) 97.9 F (36.6 C) (Oral)  Resp 18  Ht 5\' 9"  (1.753 m)  Wt 206 lb (93.441 kg)  BMI 30.41 kg/m2  SpO2 93%  Physical Exam  Nursing note and vitals reviewed. Constitutional: He is oriented to person, place, and time. He appears well-developed and well-nourished. No distress.  HENT:  Head: Normocephalic and atraumatic.  Eyes: Conjunctivae and EOM are normal.  Neck: Normal range of motion.  Cardiovascular: Normal rate and regular rhythm.  Exam reveals no gallop and no friction rub.   No murmur heard. Pulmonary/Chest: Effort normal and breath sounds normal. He has no wheezes. He has no rales. He exhibits no tenderness.  Abdominal: Soft.  He exhibits no distension. There is no tenderness. There is no rebound.  Musculoskeletal: Normal range of motion.  Left lower paraspinal tenderness to palpation. No midline tenderness or  step off noted. No obvious deformity.   Neurological: He is alert and oriented to person, place, and time. Coordination normal.  Speech is goal-oriented. Moves limbs without ataxia.   Skin: Skin is warm and dry.  Psychiatric: He has a normal mood and affect. His behavior is normal.    ED Course  Procedures (including critical care time)  Labs Reviewed  URINE CULTURE  URINALYSIS, ROUTINE W REFLEX MICROSCOPIC   Dg Lumbar Spine Complete  05/17/2012  *RADIOLOGY REPORT*  Clinical Data: Low back pain without trauma.  LUMBAR SPINE - COMPLETE 4+ VIEW  Comparison: 05/03/2012 CT  Findings: Five lumbar type vertebral bodies.  Lower thoracic spondylosis with moderate anterior osteophytes. Maintenance of vertebral body height and alignment.  Left-sided pars defect, without displacement.  IMPRESSION: Left-sided L5 pars defect.  No acute osseous finding.   Original Report Authenticated By: Jeronimo Greaves, M.D.      1. Back pain       MDM  10:00 AM Lumbar spine film shows left sided L5 pars defect, which has been noted on prior CT scan. Patient will have urinalysis. Patient given toradol and valium for pain with some relief.   11:26 AM Patient's urinalysis shows trace hemoglobin without signs of infection. Patient has experienced hemoglobin in urinalysis previously without stone. I think the back pain is musculoskeletal in etiology due to movement exacerbating the pain as well as gradual onset after a workout and reproducible pain to palpation. The patient denies any urinary symptoms at this time. I will treat the patient with percocet and flexeril for pain and instruct him to follow up with his PCP for further evaluation. Patient instructed to return with worsening or concerning symptoms.      Emilia Beck, PA-C 05/17/12 1407

## 2012-05-17 NOTE — Telephone Encounter (Signed)
Pt's wife rtn call to [employer]

## 2012-05-17 NOTE — Telephone Encounter (Signed)
Spoke with pt, Aware of dr crenshaw's recommendations.  °

## 2012-05-18 ENCOUNTER — Encounter (HOSPITAL_COMMUNITY): Admission: RE | Admit: 2012-05-18 | Payer: Medicare Other | Source: Ambulatory Visit

## 2012-05-18 LAB — URINE CULTURE
Colony Count: NO GROWTH
Culture: NO GROWTH

## 2012-05-19 NOTE — ED Provider Notes (Signed)
Medical screening examination/treatment/procedure(s) were performed by non-physician practitioner and as supervising physician I was immediately available for consultation/collaboration.   Laray Anger, DO 05/19/12 1213

## 2012-05-21 ENCOUNTER — Encounter (HOSPITAL_COMMUNITY): Payer: Medicare Other

## 2012-05-23 ENCOUNTER — Encounter (HOSPITAL_COMMUNITY): Payer: Medicare Other

## 2012-05-25 ENCOUNTER — Encounter (HOSPITAL_COMMUNITY): Payer: Medicare Other

## 2012-05-28 ENCOUNTER — Encounter (HOSPITAL_COMMUNITY): Payer: Medicare Other

## 2012-05-28 ENCOUNTER — Other Ambulatory Visit (INDEPENDENT_AMBULATORY_CARE_PROVIDER_SITE_OTHER): Payer: Medicare Other

## 2012-05-28 DIAGNOSIS — I10 Essential (primary) hypertension: Secondary | ICD-10-CM

## 2012-05-28 LAB — BASIC METABOLIC PANEL
CO2: 31 mEq/L (ref 19–32)
Calcium: 9.7 mg/dL (ref 8.4–10.5)
Glucose, Bld: 106 mg/dL — ABNORMAL HIGH (ref 70–99)
Sodium: 132 mEq/L — ABNORMAL LOW (ref 135–145)

## 2012-05-30 ENCOUNTER — Encounter (HOSPITAL_COMMUNITY): Payer: Medicare Other

## 2012-06-01 ENCOUNTER — Encounter (HOSPITAL_COMMUNITY): Payer: Medicare Other

## 2012-06-01 ENCOUNTER — Encounter (HOSPITAL_COMMUNITY)
Admission: RE | Admit: 2012-06-01 | Discharge: 2012-06-01 | Disposition: A | Discharge status: Home / Self Care | Payer: Medicare Other | Source: Ambulatory Visit | Attending: Cardiology | Admitting: Cardiology

## 2012-06-01 NOTE — Progress Notes (Signed)
[employer] today.  We Encouraged [name] to continue exercise on his own. Goodwin was noted to have a blood pressure elevation during the bike test of 164/96 repeat blood pressure of 150/86. Exit blood pressure 138/64. Will send today's blood pressure for Dr Jens Som to review.

## 2012-07-14 DIAGNOSIS — B354 Tinea corporis: Secondary | ICD-10-CM | POA: Diagnosis not present

## 2012-08-13 DIAGNOSIS — H01009 Unspecified blepharitis unspecified eye, unspecified eyelid: Secondary | ICD-10-CM | POA: Diagnosis not present

## 2012-08-13 DIAGNOSIS — H04129 Dry eye syndrome of unspecified lacrimal gland: Secondary | ICD-10-CM | POA: Diagnosis not present

## 2012-08-13 DIAGNOSIS — S058X9A Other injuries of unspecified eye and orbit, initial encounter: Secondary | ICD-10-CM | POA: Diagnosis not present

## 2012-08-13 DIAGNOSIS — H1045 Other chronic allergic conjunctivitis: Secondary | ICD-10-CM | POA: Diagnosis not present

## 2012-09-26 DIAGNOSIS — R3916 Straining to void: Secondary | ICD-10-CM | POA: Diagnosis not present

## 2012-09-26 DIAGNOSIS — N509 Disorder of male genital organs, unspecified: Secondary | ICD-10-CM | POA: Diagnosis not present

## 2012-09-26 DIAGNOSIS — N320 Bladder-neck obstruction: Secondary | ICD-10-CM | POA: Diagnosis not present

## 2012-10-09 DIAGNOSIS — N509 Disorder of male genital organs, unspecified: Secondary | ICD-10-CM | POA: Diagnosis not present

## 2012-10-09 DIAGNOSIS — R339 Retention of urine, unspecified: Secondary | ICD-10-CM | POA: Diagnosis not present

## 2012-10-09 DIAGNOSIS — R3916 Straining to void: Secondary | ICD-10-CM | POA: Diagnosis not present

## 2012-10-31 DIAGNOSIS — N3010 Interstitial cystitis (chronic) without hematuria: Secondary | ICD-10-CM | POA: Diagnosis not present

## 2012-10-31 DIAGNOSIS — N401 Enlarged prostate with lower urinary tract symptoms: Secondary | ICD-10-CM | POA: Diagnosis not present

## 2012-10-31 DIAGNOSIS — N419 Inflammatory disease of prostate, unspecified: Secondary | ICD-10-CM | POA: Diagnosis not present

## 2012-11-07 ENCOUNTER — Telehealth: Payer: Self-pay | Admitting: Cardiology

## 2012-11-07 NOTE — Telephone Encounter (Signed)
Change clonidine to 0.1 mg daily for 2 days and then DC; follow BP. [name]

## 2012-11-07 NOTE — Telephone Encounter (Signed)
Spoke with pt, Aware of dr crenshaw's recommendations.  °

## 2012-11-07 NOTE — Telephone Encounter (Signed)
New Problem  Pt states that he needs to speak about the medication (alfuzosin 10 mg)  that is causing his blood pressure to fall. BP id 87/53.Marland Kitchen He states that it is lower thanit should be.. Request a call back to discuss.

## 2012-11-07 NOTE — Telephone Encounter (Signed)
Called patient back to discuss his meds and BP. He states that Dr.Tannenbaum started him on Alfuzosin 10mg  every day on 9/24 for prostate issues. He is also taking Cozaar100mg  every day, metoprolol 50mg  BID in addition to Clonidine 0.1mg  BID that the [place] started on 8/26. His BP has been running low 113/64 last night and 95/61  87/53 today. No complaints of dizziness or fatigue. Only complaint is some sweating. Thinks he is overmedicated Advised will send message to [facility] and call him back.

## 2012-11-18 DIAGNOSIS — S93609A Unspecified sprain of unspecified foot, initial encounter: Secondary | ICD-10-CM | POA: Diagnosis not present

## 2012-12-03 DIAGNOSIS — N320 Bladder-neck obstruction: Secondary | ICD-10-CM | POA: Diagnosis not present

## 2012-12-03 DIAGNOSIS — N3010 Interstitial cystitis (chronic) without hematuria: Secondary | ICD-10-CM | POA: Diagnosis not present

## 2012-12-03 DIAGNOSIS — N139 Obstructive and reflux uropathy, unspecified: Secondary | ICD-10-CM | POA: Diagnosis not present

## 2012-12-03 DIAGNOSIS — N401 Enlarged prostate with lower urinary tract symptoms: Secondary | ICD-10-CM | POA: Diagnosis not present

## 2012-12-12 ENCOUNTER — Telehealth: Payer: Self-pay | Admitting: Cardiology

## 2012-12-12 NOTE — Telephone Encounter (Signed)
Will forward for dr crenshaw review  

## 2012-12-12 NOTE — Telephone Encounter (Signed)
Spoke with pt, Aware of dr Ludwig Clarks recommendations. Will forward to dr Patsi Sears

## 2012-12-12 NOTE — Telephone Encounter (Signed)
New Problem  Pt states that he is having a proceedure on 11/07 with the urologist. Dr. Patsi Sears is in need of a clearance for a urodynamic test.

## 2012-12-12 NOTE — Telephone Encounter (Signed)
Ok for urodynamic testing [name]

## 2012-12-14 DIAGNOSIS — R339 Retention of urine, unspecified: Secondary | ICD-10-CM | POA: Diagnosis not present

## 2012-12-19 DIAGNOSIS — R339 Retention of urine, unspecified: Secondary | ICD-10-CM | POA: Diagnosis not present

## 2012-12-19 DIAGNOSIS — N320 Bladder-neck obstruction: Secondary | ICD-10-CM | POA: Diagnosis not present

## 2012-12-19 DIAGNOSIS — N401 Enlarged prostate with lower urinary tract symptoms: Secondary | ICD-10-CM | POA: Diagnosis not present

## 2012-12-27 ENCOUNTER — Ambulatory Visit (INDEPENDENT_AMBULATORY_CARE_PROVIDER_SITE_OTHER): Payer: Medicare Other | Admitting: Cardiology

## 2012-12-27 ENCOUNTER — Encounter: Payer: Self-pay | Admitting: Cardiology

## 2012-12-27 VITALS — BP 102/50 | HR 65 | Ht 69.5 in | Wt 228.1 lb

## 2012-12-27 DIAGNOSIS — R0602 Shortness of breath: Secondary | ICD-10-CM

## 2012-12-27 DIAGNOSIS — I10 Essential (primary) hypertension: Secondary | ICD-10-CM | POA: Diagnosis not present

## 2012-12-27 DIAGNOSIS — Z952 Presence of prosthetic heart valve: Secondary | ICD-10-CM

## 2012-12-27 DIAGNOSIS — E785 Hyperlipidemia, unspecified: Secondary | ICD-10-CM | POA: Diagnosis not present

## 2012-12-27 DIAGNOSIS — Z953 Presence of xenogenic heart valve: Secondary | ICD-10-CM

## 2012-12-27 LAB — BASIC METABOLIC PANEL
BUN: 9 mg/dL (ref 6–23)
CO2: 26 mEq/L (ref 19–32)
GFR: 104.18 mL/min (ref >60.00)
Glucose, Bld: 102 mg/dL — ABNORMAL HIGH (ref 70–99)
Potassium: 4.6 mEq/L (ref 3.5–5.1)

## 2012-12-27 LAB — BRAIN NATRIURETIC PEPTIDE: Pro B Natriuretic peptide (BNP): 60 pg/mL (ref 0.0–100.0)

## 2012-12-27 NOTE — Assessment & Plan Note (Signed)
Management per primary care. 

## 2012-12-27 NOTE — Patient Instructions (Signed)
Your physician wants you to follow-up in: ONE YEAR WITH DR CRENSHAW You will receive a reminder letter in the mail two months in advance. If you don't receive a letter, please call our office to schedule the follow-up appointment.   Your physician has requested that you have an echocardiogram. Echocardiography is a painless test that uses sound waves to create images of your heart. It provides your doctor with information about the size and shape of your heart and how well your heart's chambers and valves are working. This procedure takes approximately one hour. There are no restrictions for this procedure.   Your physician recommends that you HAVE LAB WORK TODAY 

## 2012-12-27 NOTE — Assessment & Plan Note (Signed)
Blood pressure controlled. Continue present medications. Check potassium and renal function. 

## 2012-12-27 NOTE — Assessment & Plan Note (Signed)
Continue SBE prophylaxis. He is complaining of dyspnea on exertion at times. Plan repeat echocardiogram. Check BNP and d-dimer.

## 2012-12-27 NOTE — Progress Notes (Signed)
HPI: Pleasant male for fu of AVR. Previous AVR in 2006 with a porcine valve. Echocardiogram 11/29/11: Moderate LVH, EF 60-65%, grade 1 diastolic dysfunction, critical aortic stenosis with a mean gradient of 80 mmHg. LHC 11/28/11: Normal coronary arteries, severe aortic stenosis. Patient underwent redo aortic valve replacement with a pericardial tissue valve 12/06/11 with Dr. Tressie Stalker. Followup echocardiogram in November of 2013 showed normal LV function, grade 2 diastolic dysfunction, bioprosthetic aortic valve with mild aortic insufficiency and mean gradient 19 mm of mercury. Mild biatrial enlargement. No skeletal chest pain in March of 2014. Since that time he has some dyspnea on exertion but no orthopnea, PND, pedal edema, exertional chest pain or syncope.   Current Outpatient Prescriptions  Medication Sig Dispense Refill  . alfuzosin (UROXATRAL) 10 MG 24 hr tablet Take 10 mg by mouth at bedtime.      Marland Kitchen aspirin 81 MG tablet Take 81 mg by mouth daily.      . carbidopa-levodopa (SINEMET) 25-100 MG per tablet Take 2 tablets by mouth daily with breakfast.       . Cholecalciferol (VITAMIN D) 2000 UNITS CAPS Take 1 capsule by mouth daily.      . cyclobenzaprine (FLEXERIL) 10 MG tablet Take 1 tablet (10 mg total) by mouth 2 (two) times daily as needed for muscle spasms.  20 tablet  0  . docusate sodium (COLACE) 100 MG capsule Take 200 mg by mouth daily. For constipation      . ferrous gluconate (FERGON) 324 MG tablet Take 324 mg by mouth 2 (two) times daily with a meal.       . fish oil-omega-3 fatty acids 1000 MG capsule Take 1 g by mouth 2 (two) times daily.       Marland Kitchen gabapentin (NEURONTIN) 800 MG tablet Take 800 mg by mouth 3 (three) times daily.      . hydrochlorothiazide (MICROZIDE) 12.5 MG capsule Take 1 capsule (12.5 mg total) by mouth daily.  30 capsule  12  . losartan (COZAAR) 100 MG tablet Take 100 mg by mouth at bedtime.      . Methenamine-Sodium Salicylate (CYSTEX) 162-162.5 MG  TABS Take 1 tablet by mouth once.      . metoprolol tartrate (LOPRESSOR) 50 MG tablet Take 1 tablet (50 mg total) by mouth 2 (two) times daily.  180 tablet  4  . NON FORMULARY Take 1 capsule by mouth 4 (four) times daily.      . pantoprazole (PROTONIX) 40 MG tablet Take 40 mg by mouth daily as needed. For GERD      . sulfamethoxazole-trimethoprim (BACTRIM DS) 800-160 MG per tablet Take 1 tablet by mouth 2 (two) times daily.      Marland Kitchen zolpidem (AMBIEN) 10 MG tablet Take 5 mg by mouth at bedtime.        No current facility-administered medications for this visit.     Past Medical History  Diagnosis Date  . HTN (hypertension)   . GERD (gastroesophageal reflux disease)     bravo pH study 2008  . Hyperlipidemia   . Hemorrhoids     external and internal  . Insomnia   . Allergic rhinitis   . OA (osteoarthritis)   . Hiatal hernia   . BPH (benign prostatic hypertrophy)   . Chronic cough   . Anemia 2009  . Diverticulosis of colon     on colonoscopy 2008  . OSA (obstructive sleep apnea)     USES CPAP AS NEEDED  .  Headache(784.0)   . IBS (irritable bowel syndrome)   . Periodontitis     chronic with bone loss  . Gout   . Asthma   . Depression     pt denies  . Cataract   . Chronic interstitial cystitis   . Aortic stenosis     a. s/p porcine AVR 2006;  b. s/p redo tissue AVR 12/2011 (Dr. Cornelius Moras) - preAVR LHC with no CAD  . H/O aortic valve replacement with porcine valve     2006, 2013  . Diabetes mellitus     type 2  . S/P aortic valve replacement with bioprosthetic valve 12/06/2011    Redo AVR using 23 mm [facility] Ease pericardial tissue valve  . Hx of echocardiogram     a. Echo  (post AVR) 12/2011:  mod LVH, EF 60-65%, Gr 2 diast dysfn, mild AI, AVR ok (mean gradient 19 mmHg), MAC, mild BAE    Past Surgical History  Procedure Laterality Date  . Aortic valve replacement  10/13/2004    25mm [facility] [place]imount pericardial tissue valve  . Nasal septoplasty w/ turbinoplasty    .  Hernia repair    . Right knee arthroscopy    . Bunionectomy      right  . Cataract extraction  2009    &   2012    BILATERAL  . Esophagogastroduodenoscopy  08/30/2011    Procedure: ESOPHAGOGASTRODUODENOSCOPY (EGD);  Surgeon: Louis Meckel, MD;  Location: [facility] ENDOSCOPY;  Service: Endoscopy;  Laterality: N/A;  Rm 3005   . Colonoscopy  08/31/2011    Procedure: COLONOSCOPY;  Surgeon: Louis Meckel, MD;  Location: [facility] ENDOSCOPY;  Service: Endoscopy;  Laterality: N/A;  . Refractive surgery      bilateral  . Aortic valve replacement  12/06/2011    Procedure: REDO AORTIC VALVE REPLACEMENT (AVR);  Surgeon: Purcell Nails, MD;  Location: [facility] OR;  Service: Open Heart Surgery;  Laterality: N/A;    History   Social History  . Marital Status: Married    Spouse Name: N/A    Number of Children: 0  . Years of Education: N/A   Occupational History  . retired    Social History Main Topics  . Smoking status: Former Smoker    Quit date: 09/30/1971  . Smokeless tobacco: Never Used  . Alcohol Use: No  . Drug Use: No  . Sexual Activity: Not Currently   Other Topics Concern  . Not on file   Social History Narrative  . No narrative on file    ROS: no fevers or chills, productive cough, hemoptysis, dysphasia, odynophagia, melena, hematochezia, dysuria, hematuria, rash, seizure activity, orthopnea, PND, pedal edema, claudication. Remaining systems are negative.  Physical Exam: Well-developed obese in no acute distress.  Skin is warm and dry.  HEENT is normal.  Neck is supple.  Chest is clear to auscultation with normal expansion.  Cardiovascular exam is regular rate and rhythm. 2/6 systolic murmur left sternal border. No diastolic murmur. Abdominal exam nontender or distended. No masses palpated. Extremities show no edema. neuro grossly intact  ECG sinus rhythm at a rate of 65. No ST changes.

## 2012-12-28 ENCOUNTER — Encounter: Payer: Self-pay | Admitting: Nurse Practitioner

## 2013-01-10 ENCOUNTER — Ambulatory Visit (INDEPENDENT_AMBULATORY_CARE_PROVIDER_SITE_OTHER): Payer: Medicare Other | Admitting: *Deleted

## 2013-01-10 DIAGNOSIS — E785 Hyperlipidemia, unspecified: Secondary | ICD-10-CM

## 2013-01-10 DIAGNOSIS — I10 Essential (primary) hypertension: Secondary | ICD-10-CM | POA: Diagnosis not present

## 2013-01-10 LAB — BASIC METABOLIC PANEL
BUN: 10 mg/dL (ref 6–23)
CO2: 31 mEq/L (ref 19–32)
Chloride: 99 mEq/L (ref 96–112)
GFR: 104.16 mL/min (ref >60.00)
Glucose, Bld: 140 mg/dL — ABNORMAL HIGH (ref 70–99)
Potassium: 4.3 mEq/L (ref 3.5–5.1)
Sodium: 133 mEq/L — ABNORMAL LOW (ref 135–145)

## 2013-01-17 ENCOUNTER — Other Ambulatory Visit (HOSPITAL_COMMUNITY): Payer: Self-pay

## 2013-01-21 ENCOUNTER — Other Ambulatory Visit (HOSPITAL_COMMUNITY): Payer: Self-pay

## 2013-01-24 ENCOUNTER — Encounter: Payer: Self-pay | Admitting: Cardiology

## 2013-01-24 ENCOUNTER — Other Ambulatory Visit: Payer: Self-pay

## 2013-01-24 ENCOUNTER — Ambulatory Visit (HOSPITAL_COMMUNITY): Payer: Medicare Other | Attending: Cardiology | Admitting: Radiology

## 2013-01-24 DIAGNOSIS — Z952 Presence of prosthetic heart valve: Secondary | ICD-10-CM | POA: Insufficient documentation

## 2013-01-24 DIAGNOSIS — Z87891 Personal history of nicotine dependence: Secondary | ICD-10-CM | POA: Insufficient documentation

## 2013-01-24 DIAGNOSIS — R0609 Other forms of dyspnea: Secondary | ICD-10-CM | POA: Insufficient documentation

## 2013-01-24 DIAGNOSIS — R0989 Other specified symptoms and signs involving the circulatory and respiratory systems: Secondary | ICD-10-CM | POA: Insufficient documentation

## 2013-01-24 DIAGNOSIS — E119 Type 2 diabetes mellitus without complications: Secondary | ICD-10-CM | POA: Diagnosis not present

## 2013-01-24 DIAGNOSIS — R0602 Shortness of breath: Secondary | ICD-10-CM | POA: Diagnosis not present

## 2013-01-24 DIAGNOSIS — I359 Nonrheumatic aortic valve disorder, unspecified: Secondary | ICD-10-CM | POA: Diagnosis not present

## 2013-01-24 DIAGNOSIS — Z953 Presence of xenogenic heart valve: Secondary | ICD-10-CM

## 2013-01-24 DIAGNOSIS — R011 Cardiac murmur, unspecified: Secondary | ICD-10-CM

## 2013-01-24 DIAGNOSIS — I350 Nonrheumatic aortic (valve) stenosis: Secondary | ICD-10-CM

## 2013-01-24 DIAGNOSIS — I079 Rheumatic tricuspid valve disease, unspecified: Secondary | ICD-10-CM | POA: Diagnosis not present

## 2013-01-24 DIAGNOSIS — I10 Essential (primary) hypertension: Secondary | ICD-10-CM | POA: Diagnosis not present

## 2013-01-24 DIAGNOSIS — E785 Hyperlipidemia, unspecified: Secondary | ICD-10-CM | POA: Diagnosis not present

## 2013-01-24 DIAGNOSIS — I059 Rheumatic mitral valve disease, unspecified: Secondary | ICD-10-CM | POA: Insufficient documentation

## 2013-01-24 DIAGNOSIS — E669 Obesity, unspecified: Secondary | ICD-10-CM | POA: Diagnosis not present

## 2013-01-24 DIAGNOSIS — Z6833 Body mass index (BMI) 33.0-33.9, adult: Secondary | ICD-10-CM | POA: Insufficient documentation

## 2013-01-24 NOTE — Progress Notes (Signed)
Echocardiogram performed.  

## 2013-02-16 DIAGNOSIS — J209 Acute bronchitis, unspecified: Secondary | ICD-10-CM | POA: Diagnosis not present

## 2013-03-09 ENCOUNTER — Encounter (HOSPITAL_COMMUNITY): Payer: Self-pay | Admitting: Emergency Medicine

## 2013-03-09 ENCOUNTER — Emergency Department (HOSPITAL_COMMUNITY): Payer: Medicare Other

## 2013-03-09 ENCOUNTER — Emergency Department (HOSPITAL_COMMUNITY)
Admission: EM | Admit: 2013-03-09 | Discharge: 2013-03-09 | Disposition: A | Discharge status: Home / Self Care | Payer: Medicare Other | Attending: Emergency Medicine | Admitting: Emergency Medicine

## 2013-03-09 DIAGNOSIS — F3289 Other specified depressive episodes: Secondary | ICD-10-CM | POA: Diagnosis not present

## 2013-03-09 DIAGNOSIS — D649 Anemia, unspecified: Secondary | ICD-10-CM | POA: Insufficient documentation

## 2013-03-09 DIAGNOSIS — E785 Hyperlipidemia, unspecified: Secondary | ICD-10-CM | POA: Insufficient documentation

## 2013-03-09 DIAGNOSIS — H579 Unspecified disorder of eye and adnexa: Secondary | ICD-10-CM | POA: Insufficient documentation

## 2013-03-09 DIAGNOSIS — F329 Major depressive disorder, single episode, unspecified: Secondary | ICD-10-CM | POA: Diagnosis not present

## 2013-03-09 DIAGNOSIS — Z9889 Other specified postprocedural states: Secondary | ICD-10-CM | POA: Diagnosis not present

## 2013-03-09 DIAGNOSIS — Z952 Presence of prosthetic heart valve: Secondary | ICD-10-CM | POA: Insufficient documentation

## 2013-03-09 DIAGNOSIS — G4733 Obstructive sleep apnea (adult) (pediatric): Secondary | ICD-10-CM | POA: Insufficient documentation

## 2013-03-09 DIAGNOSIS — I10 Essential (primary) hypertension: Secondary | ICD-10-CM | POA: Insufficient documentation

## 2013-03-09 DIAGNOSIS — E119 Type 2 diabetes mellitus without complications: Secondary | ICD-10-CM | POA: Diagnosis not present

## 2013-03-09 DIAGNOSIS — Z9849 Cataract extraction status, unspecified eye: Secondary | ICD-10-CM | POA: Insufficient documentation

## 2013-03-09 DIAGNOSIS — Z87891 Personal history of nicotine dependence: Secondary | ICD-10-CM | POA: Diagnosis not present

## 2013-03-09 DIAGNOSIS — N400 Enlarged prostate without lower urinary tract symptoms: Secondary | ICD-10-CM | POA: Insufficient documentation

## 2013-03-09 DIAGNOSIS — Z7982 Long term (current) use of aspirin: Secondary | ICD-10-CM | POA: Diagnosis not present

## 2013-03-09 DIAGNOSIS — H9209 Otalgia, unspecified ear: Secondary | ICD-10-CM | POA: Insufficient documentation

## 2013-03-09 DIAGNOSIS — J45909 Unspecified asthma, uncomplicated: Secondary | ICD-10-CM | POA: Diagnosis not present

## 2013-03-09 DIAGNOSIS — R0789 Other chest pain: Secondary | ICD-10-CM | POA: Diagnosis not present

## 2013-03-09 DIAGNOSIS — Z954 Presence of other heart-valve replacement: Secondary | ICD-10-CM | POA: Diagnosis not present

## 2013-03-09 DIAGNOSIS — Z79899 Other long term (current) drug therapy: Secondary | ICD-10-CM | POA: Insufficient documentation

## 2013-03-09 DIAGNOSIS — K219 Gastro-esophageal reflux disease without esophagitis: Secondary | ICD-10-CM | POA: Insufficient documentation

## 2013-03-09 DIAGNOSIS — M109 Gout, unspecified: Secondary | ICD-10-CM | POA: Diagnosis not present

## 2013-03-09 DIAGNOSIS — J42 Unspecified chronic bronchitis: Secondary | ICD-10-CM | POA: Diagnosis not present

## 2013-03-09 DIAGNOSIS — M1990 Unspecified osteoarthritis, unspecified site: Secondary | ICD-10-CM | POA: Insufficient documentation

## 2013-03-09 DIAGNOSIS — R011 Cardiac murmur, unspecified: Secondary | ICD-10-CM | POA: Diagnosis not present

## 2013-03-09 DIAGNOSIS — R51 Headache: Secondary | ICD-10-CM | POA: Diagnosis not present

## 2013-03-09 DIAGNOSIS — Z792 Long term (current) use of antibiotics: Secondary | ICD-10-CM | POA: Insufficient documentation

## 2013-03-09 DIAGNOSIS — G4700 Insomnia, unspecified: Secondary | ICD-10-CM | POA: Insufficient documentation

## 2013-03-09 DIAGNOSIS — R42 Dizziness and giddiness: Secondary | ICD-10-CM | POA: Diagnosis not present

## 2013-03-09 LAB — CBC
HCT: 38.1 % — ABNORMAL LOW (ref 39.0–52.0)
Hemoglobin: 12.6 g/dL — ABNORMAL LOW (ref 13.0–17.0)
MCH: 27.9 pg (ref 26.0–34.0)
MCHC: 33.1 g/dL (ref 30.0–36.0)
MCV: 84.3 fL (ref 78.0–100.0)
Platelets: 225 10*3/uL (ref 150–400)
RBC: 4.52 MIL/uL (ref 4.22–5.81)
RDW: 14.3 % (ref 11.5–15.5)
WBC: 5.4 10*3/uL (ref 4.0–10.5)

## 2013-03-09 LAB — POCT I-STAT TROPONIN I
Troponin i, poc: 0 ng/mL (ref 0.00–0.08)
Troponin i, poc: 0 ng/mL (ref 0.00–0.08)

## 2013-03-09 LAB — BASIC METABOLIC PANEL WITH GFR
BUN: 7 mg/dL (ref 6–23)
CO2: 27 meq/L (ref 19–32)
Chloride: 94 meq/L — ABNORMAL LOW (ref 96–112)
Creatinine, Ser: 0.63 mg/dL (ref 0.50–1.35)
Potassium: 4.2 meq/L (ref 3.7–5.3)

## 2013-03-09 LAB — BASIC METABOLIC PANEL
Calcium: 10 mg/dL (ref 8.4–10.5)
GFR calc Af Amer: >90 mL/min (ref >90)
GFR calc non Af Amer: >90 mL/min (ref >90)
Glucose, Bld: 172 mg/dL — ABNORMAL HIGH (ref 70–99)
Sodium: 132 mEq/L — ABNORMAL LOW (ref 137–147)

## 2013-03-09 LAB — GLUCOSE, CAPILLARY: Glucose-Capillary: 142 mg/dL — ABNORMAL HIGH (ref 70–99)

## 2013-03-09 MED ORDER — ONDANSETRON 4 MG PO TBDP
8.0000 mg | ORAL_TABLET | Freq: Once | ORAL | Status: AC
  Administered 2013-03-09: 8 mg via ORAL
  Filled 2013-03-09: qty 2

## 2013-03-09 MED ORDER — GI COCKTAIL ~~LOC~~
30.0000 mL | Freq: Once | ORAL | Status: AC
Start: 2013-03-09 — End: 2013-03-09
  Administered 2013-03-09: 30 mL via ORAL
  Filled 2013-03-09: qty 30

## 2013-03-09 MED ORDER — LORAZEPAM 1 MG PO TABS
1.0000 mg | ORAL_TABLET | Freq: Once | ORAL | Status: AC
  Administered 2013-03-09: 1 mg via ORAL
  Filled 2013-03-09: qty 1

## 2013-03-09 NOTE — ED Notes (Signed)
Pt c/o midsternal tightness in chest, nausea, and dizziness since yesterday. He states it may be from his hiatal hernia but he does have a cardiac history so hes concerned. He is A&Ox4, breathing easily

## 2013-03-09 NOTE — ED Notes (Signed)
Pt undressed, in gown, on monitor, continuous bp cuff and pulse oximtry, family at bedside

## 2013-03-09 NOTE — ED Provider Notes (Signed)
CSN: [id number]     Arrival date & time 03/09/13  1008 History   First MD Initiated Contact with Patient 03/09/13 1107     Chief Complaint  Patient presents with  . Chest Pain   (Consider location/radiation/quality/duration/timing/severity/associated sxs/prior Treatment) HPI Comments: Patient with history of aortic valve replacement x2, clean cardiac catheterization in 2013 -- presents with complaint of dizziness described as being very unsteady on his feet for the past 24 hours. Patient normally ambulates well at home. He has needed assistance from his wife as well as by holding onto objects to help him walk. He denies near syncope or full syncope. Last night the patient developed some mid chest tightness without radiation, palpitations, nausea/vomiting. This resolved and then returned this morning. He attributes this pain to a previous diagnosis of hiatal hernia, however he is worried that it could be his heart as well given his history. No treatments prior to arrival. Patient denies shortness of breath, lower extremity swelling. No fever, cough, abdominal pain, urinary changes, stool changes. Patient otherwise denies signs of stroke including: facial droop, slurred speech, aphasia, weakness/numbness in extremities. He is not on any blood thinning medications.    The history is provided by the patient and medical records.    Past Medical History  Diagnosis Date  . HTN (hypertension)   . GERD (gastroesophageal reflux disease)     bravo pH study 2008  . Hyperlipidemia   . Hemorrhoids     external and internal  . Insomnia   . Allergic rhinitis   . OA (osteoarthritis)   . Hiatal hernia   . BPH (benign prostatic hypertrophy)   . Chronic cough   . Anemia 2009  . Diverticulosis of colon     on colonoscopy 2008  . OSA (obstructive sleep apnea)     USES CPAP AS NEEDED  . Headache(784.0)   . IBS (irritable bowel syndrome)   . Periodontitis     chronic with bone loss  . Gout   . Asthma    . Depression     pt denies  . Cataract   . Chronic interstitial cystitis   . Aortic stenosis     a. s/p porcine AVR 2006;  b. s/p redo tissue AVR 12/2011 (Dr. Roxy Manns) - preAVR LHC with no CAD  . H/O aortic valve replacement with porcine valve     2006, 2013  . Diabetes mellitus     type 2  . S/P aortic valve replacement with bioprosthetic valve 12/06/2011    Redo AVR using 23 mm [facility] Ease pericardial tissue valve  . Hx of echocardiogram     a. Echo  (post AVR) 12/2011:  mod LVH, EF 60-65%, Gr 2 diast dysfn, mild AI, AVR ok (mean gradient 19 mmHg), MAC, mild BAE   Past Surgical History  Procedure Laterality Date  . Aortic valve replacement  10/13/2004    27mm [place] pericardial tissue valve  . Nasal septoplasty w/ turbinoplasty    . Hernia repair    . Right knee arthroscopy    . Bunionectomy      right  . Cataract extraction  2009    &   2012    BILATERAL  . Esophagogastroduodenoscopy  08/30/2011    Procedure: ESOPHAGOGASTRODUODENOSCOPY (EGD);  Surgeon: Inda Castle, MD;  Location: [place];  Service: Endoscopy;  Laterality: N/A;  Rm 3005   . Colonoscopy  08/31/2011    Procedure: COLONOSCOPY;  Surgeon: Inda Castle, MD;  Location: [facility]  ENDOSCOPY;  Service: Endoscopy;  Laterality: N/A;  . Refractive surgery      bilateral  . Aortic valve replacement  12/06/2011    Procedure: REDO AORTIC VALVE REPLACEMENT (AVR);  Surgeon: Rexene Alberts, MD;  Location: [place];  Service: Open Heart Surgery;  Laterality: N/A;   Family History  Problem Relation Age of Onset  . Heart disease Father   . Osteoarthritis Mother   . Hypertension Sister   . Hyperlipidemia     History  Substance Use Topics  . Smoking status: Former Smoker    Quit date: 09/30/1971  . Smokeless tobacco: Never Used  . Alcohol Use: No    Review of Systems  Constitutional: Negative for fever, diaphoresis and fatigue.  HENT: Positive for ear pain. Negative for congestion, ear discharge and  tinnitus.   Eyes: Positive for itching. Negative for photophobia, pain, discharge, redness and visual disturbance.  Respiratory: Negative for cough and shortness of breath.   Cardiovascular: Positive for chest pain. Negative for palpitations and leg swelling.  Gastrointestinal: Negative for nausea, vomiting and abdominal pain.  Genitourinary: Negative for dysuria.  Musculoskeletal: Negative for back pain, gait problem and neck pain.  Skin: Negative for rash and wound.  Neurological: Positive for dizziness. Negative for syncope, weakness, light-headedness, numbness and headaches.  Psychiatric/Behavioral: Negative for confusion and decreased concentration.    Allergies  Avodart; Codeine; Diazepam; Doxazosin; Feraheme; Losartan; and Spironolactone  Home Medications   Current Outpatient Rx  Name  Route  Sig  Dispense  Refill  . alfuzosin (UROXATRAL) 10 MG 24 hr tablet   Oral   Take 10 mg by mouth at bedtime.         Marland Kitchen aspirin 81 MG tablet   Oral   Take 81 mg by mouth daily.         . carbidopa-levodopa (SINEMET) 25-100 MG per tablet   Oral   Take 2 tablets by mouth daily with breakfast.          . Cholecalciferol (VITAMIN D) 2000 UNITS CAPS   Oral   Take 1 capsule by mouth daily.         . cyclobenzaprine (FLEXERIL) 10 MG tablet   Oral   Take 1 tablet (10 mg total) by mouth 2 (two) times daily as needed for muscle spasms.   20 tablet   0   . docusate sodium (COLACE) 100 MG capsule   Oral   Take 200 mg by mouth daily. For constipation         . ferrous gluconate (FERGON) 324 MG tablet   Oral   Take 324 mg by mouth 2 (two) times daily with a meal.          . fish oil-omega-3 fatty acids 1000 MG capsule   Oral   Take 1 g by mouth 2 (two) times daily.          Marland Kitchen gabapentin (NEURONTIN) 800 MG tablet   Oral   Take 800 mg by mouth 3 (three) times daily.         . hydrochlorothiazide (MICROZIDE) 12.5 MG capsule   Oral   Take 1 capsule (12.5 mg total) by  mouth daily.   30 capsule   12   . losartan (COZAAR) 100 MG tablet   Oral   Take 100 mg by mouth at bedtime.         . Methenamine-Sodium Salicylate (CYSTEX) XX123456 MG TABS   Oral   Take 1 tablet by mouth once.         Marland Kitchen  metoprolol tartrate (LOPRESSOR) 50 MG tablet   Oral   Take 1 tablet (50 mg total) by mouth 2 (two) times daily.   180 tablet   4   . NON FORMULARY   Oral   Take 1 capsule by mouth 4 (four) times daily.         . pantoprazole (PROTONIX) 40 MG tablet   Oral   Take 40 mg by mouth daily as needed. For GERD         . sulfamethoxazole-trimethoprim (BACTRIM DS) 800-160 MG per tablet   Oral   Take 1 tablet by mouth 2 (two) times daily.         Marland Kitchen zolpidem (AMBIEN) 10 MG tablet   Oral   Take 5 mg by mouth at bedtime.           BP 147/79  Pulse 66  Temp(Src) 97.6 F (36.4 C) (Oral)  Resp 20  Ht 5\' 9"  (1.753 m)  Wt 222 lb (100.699 kg)  BMI 32.77 kg/m2  SpO2 96%  Physical Exam  Nursing note and vitals reviewed. Constitutional: He is oriented to person, place, and time. He appears well-developed and well-nourished.  HENT:  Head: Normocephalic and atraumatic.  Right Ear: Tympanic membrane, external ear and ear canal normal.  Left Ear: Tympanic membrane, external ear and ear canal normal.  Nose: Nose normal.  Mouth/Throat: Uvula is midline, oropharynx is clear and moist and mucous membranes are normal. Mucous membranes are not dry.  Eyes: Conjunctivae, EOM and lids are normal. Pupils are equal, round, and reactive to light.  Neck: Trachea normal and normal range of motion. Neck supple. Normal carotid pulses and no JVD present. No muscular tenderness present. Carotid bruit is not present. No tracheal deviation present.  Cardiovascular: Normal rate, regular rhythm, S1 normal, S2 normal and intact distal pulses.  Exam reveals no distant heart sounds and no decreased pulses.   Murmur heard.  Systolic murmur is present with a grade of 2/6   Pulmonary/Chest: Effort normal and breath sounds normal. No respiratory distress. He has no wheezes. He exhibits no tenderness.  Abdominal: Soft. Normal aorta and bowel sounds are normal. There is no tenderness. There is no rebound and no guarding.  Musculoskeletal: Normal range of motion. He exhibits no edema.       Cervical back: He exhibits normal range of motion, no tenderness and no bony tenderness.  Neurological: He is alert and oriented to person, place, and time. He has normal strength and normal reflexes. No cranial nerve deficit or sensory deficit. He exhibits normal muscle tone. He displays a negative Romberg sign. Coordination and gait normal. GCS eye subscore is 4. GCS verbal subscore is 5. GCS motor subscore is 6.  Skin: Skin is warm and dry. He is not diaphoretic. No cyanosis. No pallor.  Psychiatric: He has a normal mood and affect.    ED Course  Procedures (including critical care time) Labs Review Labs Reviewed  CBC - Abnormal; Notable for the following:    Hemoglobin 12.6 (*)    HCT 38.1 (*)    All other components within normal limits  BASIC METABOLIC PANEL - Abnormal; Notable for the following:    Sodium 132 (*)    Chloride 94 (*)    Glucose, Bld 172 (*)    All other components within normal limits  GLUCOSE, CAPILLARY - Abnormal; Notable for the following:    Glucose-Capillary 142 (*)    All other components within normal limits  POCT I-STAT  TROPONIN I  POCT I-STAT TROPONIN I   Imaging Review Dg Chest 2 View  03/09/2013   CLINICAL DATA:  Right-sided chest pain for 2 days, history hypertension, diabetes, AVR, asthma  EXAM: CHEST  2 VIEW  COMPARISON:  04/18/2012  FINDINGS: Upper normal heart size post AVR.  Mediastinal contours and pulmonary vascularity normal.  Slight chronic elevation of right diaphragm.  Minimal chronic peribronchial thickening.  Lungs clear.  No pleural effusion or pneumothorax.  No acute osseous findings.  IMPRESSION: Minimal chronic bronchitic  changes.  No acute abnormalities.   Electronically Signed   By: Lavonia Dana M.D.   On: 03/09/2013 11:07    EKG Interpretation    Date/Time:  Saturday March 09 2013 10:15:11 EST Ventricular Rate:  65 PR Interval:  190 QRS Duration: 110 QT Interval:  422 QTC Calculation: 438 R Axis:   -53 Text Interpretation:  Normal sinus rhythm Left anterior fascicular block borderline abn q waves laterally, not sig changed from most recent  prior Abnormal ECG Left axis deviation Confirmed by [facility]  MD, MICHEAL ([id number]) on 03/09/2013 12:48:17 PM           11:26 AM Patient seen and examined. D/w Dr. Dorna Mai who will see. Work-up initiated. Medications ordered.   Vital signs reviewed and are as follows: Filed Vitals:   03/09/13 1015  BP: 147/79  Pulse: 66  Temp: 97.6 F (36.4 C)  Resp: 20   MRI ordered to rule out posterior circulation event.   4:26 PM MRI neg. Troponin x2 negative. Plan discussed with Dr. Dorna Mai.   Patient and wife informed of all results.   Urged PCP f/u this coming week.   Patient counseled to return if they have weakness in their arms or legs, slurred speech, trouble walking or talking, confusion, trouble with their balance, or if they have any other concerns. Patient verbalizes understanding and agrees with plan.   Patient was counseled to return with severe chest pain, especially if the pain is crushing or pressure-like and spreads to the arms, back, neck, or jaw, or if they have sweating, nausea, or shortness of breath with the pain. They were encouraged to call 911 with these symptoms.   They were also told to return if their chest pain gets worse and does not go away with rest, they have an attack of chest pain lasting longer than usual despite rest and treatment with the medications their caregiver has prescribed, if they wake from sleep with chest pain or shortness of breath, if they feel dizzy or faint, if they have chest pain not typical of their usual pain, or if they  have any other emergent concerns regarding their health.  The patient verbalized understanding and agreed.   MDM   1. Dizziness   2. Chest discomfort    Dizziness: Amount of assistance needed to ambulate was concerning for central circulation CVA. This was ruled out with MRI. Patient has ambulated with some assistance in ED and his wife will be at home to assist. Patient does have some L ear pain but no evidence of infection on exam. Dizziness likely peripheral given these findings. PCP f/u indicated. Neuro exam otherwise normal.   CP: Unchanged EKG. Troponin neg x2. Patient had clean cardiac cath < 2 years ago. Do not suspect ACS. H/o aortic valve. No signs of valve failure, acute CHF. Patient did have relief with GI cocktail.   No dangerous or life-threatening conditions suspected or identified by history, physical exam, and by work-up.  No indications for hospitalization identified.    Carlisle Cater, PA-C 03/09/13 [phone]

## 2013-03-09 NOTE — Discharge Instructions (Signed)
Please read and follow all provided instructions.  Your diagnoses today include:  1. Dizziness   2. Chest discomfort    Tests performed today include:  An EKG of your heart  A chest x-ray - no pneumonia  Cardiac enzymes - a blood test for heart muscle damage  Blood counts and electrolytes  MRI of brain - no stroke which would explain dizziness  Vital signs. See below for your results today.   Medications prescribed:   None  Take any prescribed medications only as directed.  Follow-up instructions: Please follow-up with your primary care provider as soon as you can for further evaluation of your symptoms. If you do not have a primary care doctor -- see below for referral information.   Return instructions:  SEEK IMMEDIATE MEDICAL ATTENTION IF:  You have severe chest pain, especially if the pain is crushing or pressure-like and spreads to the arms, back, neck, or jaw, or if you have sweating, nausea (feeling sick to your stomach), or shortness of breath. THIS IS AN EMERGENCY. Don't wait to see if the pain will go away. Get medical help at once. Call 911 or 0 (operator). DO NOT drive yourself to the hospital.   Your chest pain gets worse and does not go away with rest.   You have an attack of chest pain lasting longer than usual, despite rest and treatment with the medications your caregiver has prescribed.   You wake from sleep with chest pain or shortness of breath.  You feel dizzy or faint.  You have chest pain not typical of your usual pain for which you originally saw your caregiver.   You have any other emergent concerns regarding your health.  Return if you have weakness in your arms or legs, slurred speech, trouble walking or talking, confusion, or trouble with your balance.   Additional Information: Chest pain comes from many different causes. Your caregiver has diagnosed you as having chest pain that is not specific for one problem, but does not require  admission.  You are at low risk for an acute heart condition or other serious illness.   Your vital signs today were: BP 143/80   Pulse 55   Temp(Src) 97.8 F (36.6 C) (Oral)   Resp 13   Ht 5\' 9"  (1.753 m)   Wt 222 lb (100.699 kg)   BMI 32.77 kg/m2   SpO2 97% If your blood pressure (BP) was elevated above 135/85 this visit, please have this repeated by your doctor within one month. --------------

## 2013-03-09 NOTE — ED Notes (Signed)
Pt returned from MRI °

## 2013-03-10 NOTE — ED Provider Notes (Signed)
Medical screening examination/treatment/procedure(s) were performed by non-physician practitioner and as supervising physician I was immediately available for consultation/collaboration.  EKG Interpretation    Date/Time:  Saturday March 09 2013 10:15:11 EST Ventricular Rate:  65 PR Interval:  190 QRS Duration: 110 QT Interval:  422 QTC Calculation: 438 R Axis:   -53 Text Interpretation:  Normal sinus rhythm Left anterior fascicular block borderline abn q waves laterally, not sig changed from most recent  prior Abnormal ECG Left axis deviation Confirmed by [facility]  MD, MICHEAL ([id number]) on 03/09/2013 12:48:17 PM              Saddie Benders. Miyako Oelke, MD 03/10/13 [id number]

## 2013-04-30 DIAGNOSIS — N401 Enlarged prostate with lower urinary tract symptoms: Secondary | ICD-10-CM | POA: Diagnosis not present

## 2013-04-30 DIAGNOSIS — R3916 Straining to void: Secondary | ICD-10-CM | POA: Diagnosis not present

## 2013-04-30 DIAGNOSIS — N3010 Interstitial cystitis (chronic) without hematuria: Secondary | ICD-10-CM | POA: Diagnosis not present

## 2013-04-30 DIAGNOSIS — N320 Bladder-neck obstruction: Secondary | ICD-10-CM | POA: Diagnosis not present

## 2013-05-03 ENCOUNTER — Telehealth: Payer: Self-pay | Admitting: Cardiology

## 2013-05-03 NOTE — Telephone Encounter (Signed)
Walk in pt form " Sealed Envelope" gave to Sherri 3.27.15

## 2013-05-07 ENCOUNTER — Telehealth: Payer: Self-pay | Admitting: Cardiology

## 2013-05-07 NOTE — Telephone Encounter (Signed)
Received request from Nurse fax box, documents faxed for surgical clearance. To: [facility] Fax number: [phone] Attention: 3.31.15/km

## 2013-05-09 ENCOUNTER — Other Ambulatory Visit: Payer: Self-pay | Admitting: Urology

## 2013-06-08 ENCOUNTER — Encounter: Payer: Self-pay | Admitting: Cardiology

## 2013-06-13 ENCOUNTER — Other Ambulatory Visit: Payer: Self-pay | Admitting: *Deleted

## 2013-06-13 DIAGNOSIS — I10 Essential (primary) hypertension: Secondary | ICD-10-CM

## 2013-06-13 MED ORDER — HYDROCHLOROTHIAZIDE 12.5 MG PO CAPS: 12.5000 mg | ORAL_CAPSULE | Freq: Every day | ORAL | Status: DC

## 2013-06-27 ENCOUNTER — Encounter (HOSPITAL_COMMUNITY): Admission: RE | Payer: Self-pay | Source: Ambulatory Visit

## 2013-06-27 ENCOUNTER — Inpatient Hospital Stay (HOSPITAL_COMMUNITY): Admission: RE | Admit: 2013-06-27 | Payer: Medicare Other | Source: Ambulatory Visit | Admitting: Urology

## 2013-06-27 SURGERY — TRANSURETHRAL RESECTION OF THE PROSTATE WITH GYRUS INSTRUMENTS
Anesthesia: General

## 2013-08-08 DIAGNOSIS — M1710 Unilateral primary osteoarthritis, unspecified knee: Secondary | ICD-10-CM | POA: Diagnosis not present

## 2013-08-08 DIAGNOSIS — M25569 Pain in unspecified knee: Secondary | ICD-10-CM | POA: Diagnosis not present

## 2013-09-22 DIAGNOSIS — M545 Low back pain, unspecified: Secondary | ICD-10-CM | POA: Diagnosis not present

## 2013-10-11 DIAGNOSIS — N453 Epididymo-orchitis: Secondary | ICD-10-CM | POA: Diagnosis not present

## 2013-11-13 IMAGING — CR DG CHEST 1V PORT
1 series · 1 of 1 positions shown · non-contrast
Comparison: 08/31/2011.

CLINICAL DATA: Syncope.

PORTABLE CHEST - 1 VIEW

[AP]
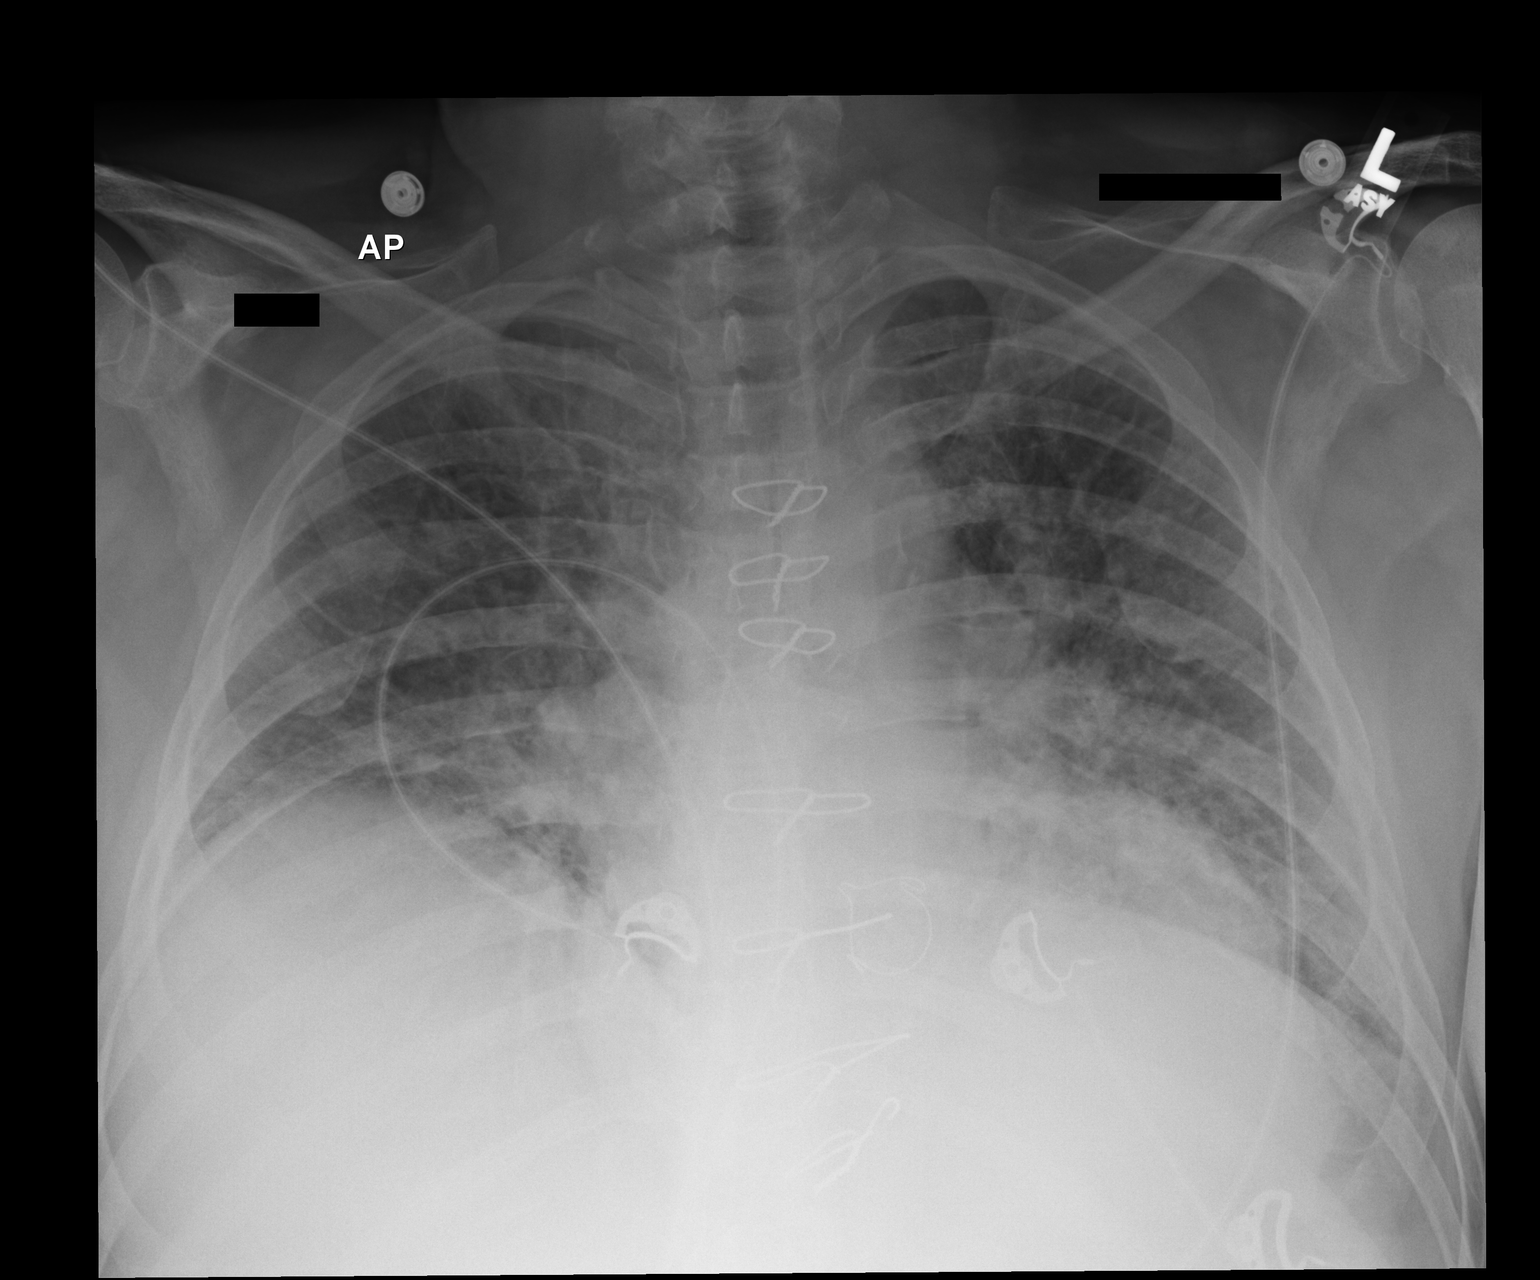

[1 of 1 positions shown; findings below may reference images not displayed]

FINDINGS: Trachea is midline.  Heart is enlarged.  Lungs are low in
volume with diffuse mixed interstitial and air space disease.  No
definite pleural fluid.
IMPRESSION: Low lung volumes with presumed pulmonary edema.

## 2013-11-21 IMAGING — CR DG CHEST 1V PORT
1 series · 1 of 1 positions shown · non-contrast
Comparison: 11/30/2011.

CLINICAL DATA: Postoperative.

PORTABLE CHEST - 1 VIEW

[AP]
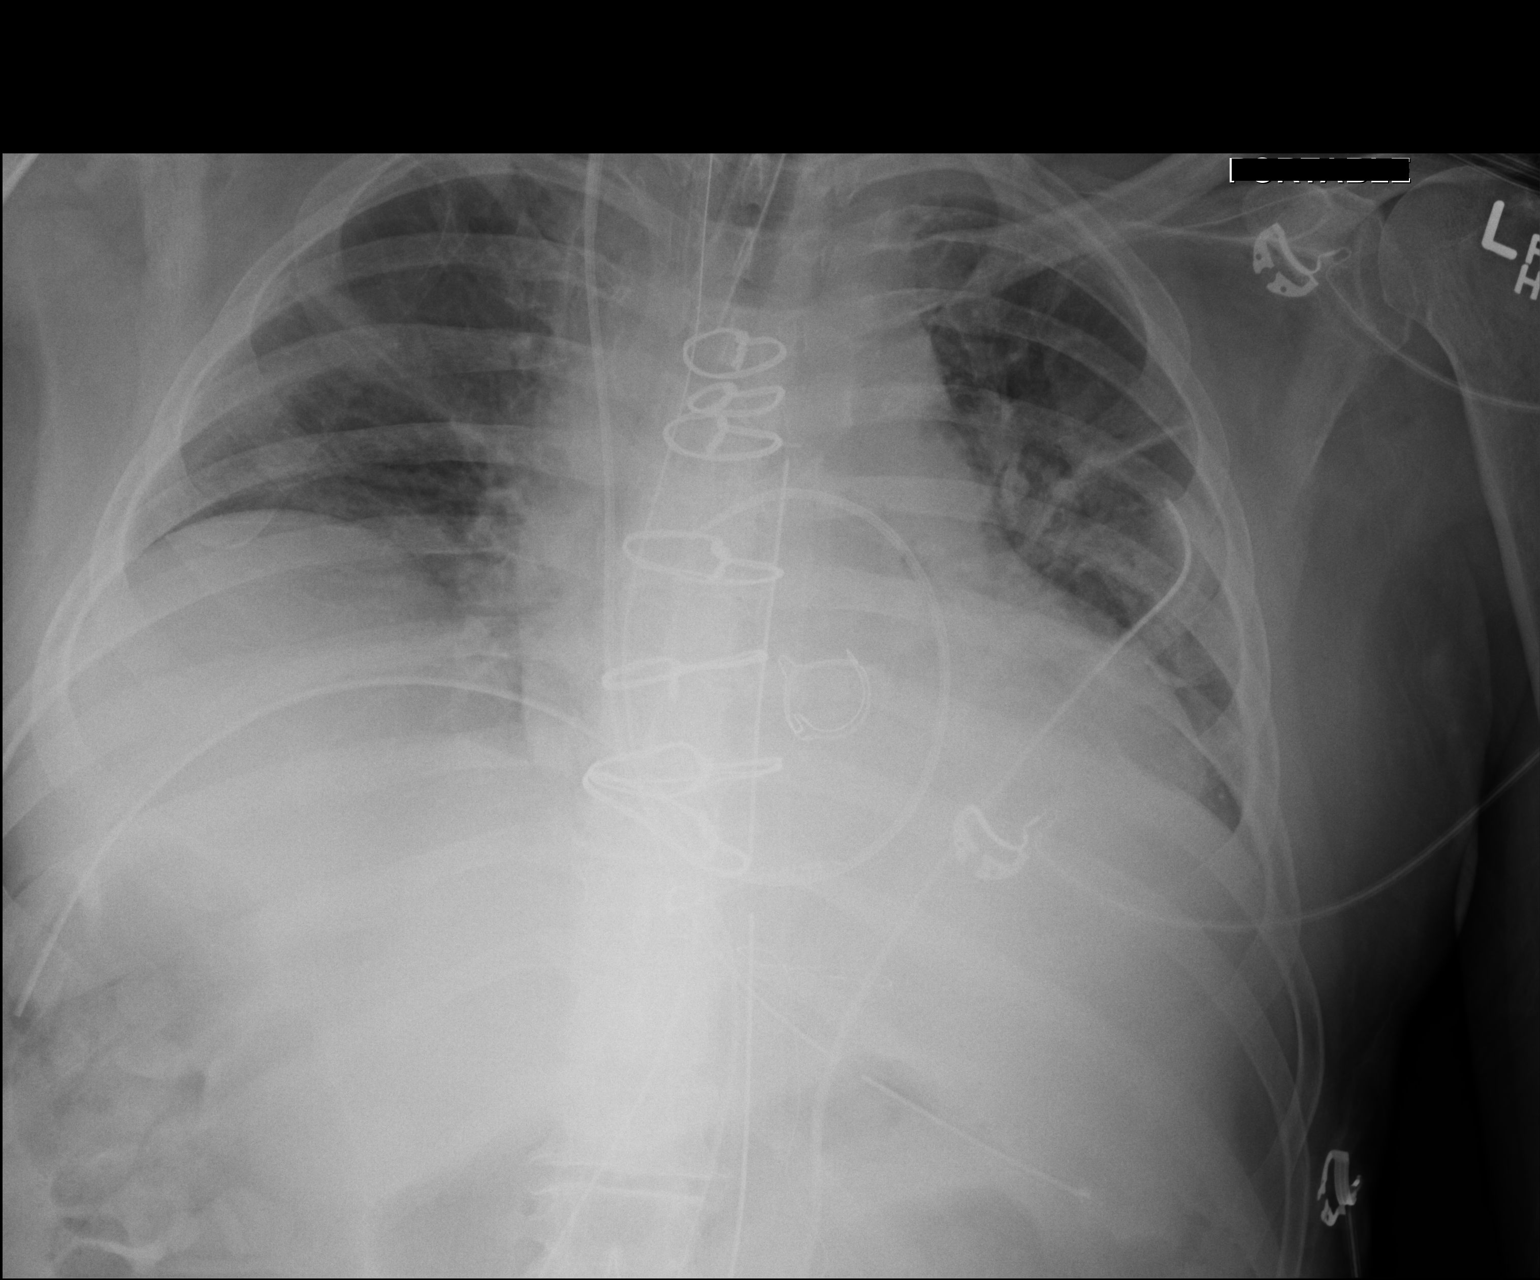

[1 of 1 positions shown; findings below may reference images not displayed]

FINDINGS: The patient is now status post cardiac valve repair.  An
endotracheal tube is in position with the tip 4.2 cm above the
carina.  A pulmonary catheter has been inserted through a the right
IJ Cordis sheath.  The tip of the pulmonary artery catheter is in
the right pulmonary artery.  A nasogastric tube extends through the
mediastinum into the stomach with the tip in the fundus.  There is
a mediastinal drain in place to the left of the spine with the tip
at the axial level of the carina.  There is a left thoracic tube in
place with its tip along the lateral margin of the left fifth rib.
There is right-sided thoracic tube  overlying the medial right
hemidiaphragm  with its tip along the inferior aspect of the right
heart border.  The lungs are not well expanded and there are patchy
atelectatic changes present; but, there is no evidence of edema,
effusions or pneumothoraces.  There are no acute bony changes.
IMPRESSION: Postoperative findings with support equipment in position as
described.  No pneumothorax, edema or consolidation.

## 2013-11-22 IMAGING — CR DG CHEST 1V PORT
1 series · 1 of 1 positions shown · non-contrast
Comparison: Portable chest x-ray of 12/06/2011

CLINICAL DATA: Postop times 1 day

PORTABLE CHEST - 1 VIEW

[AP]
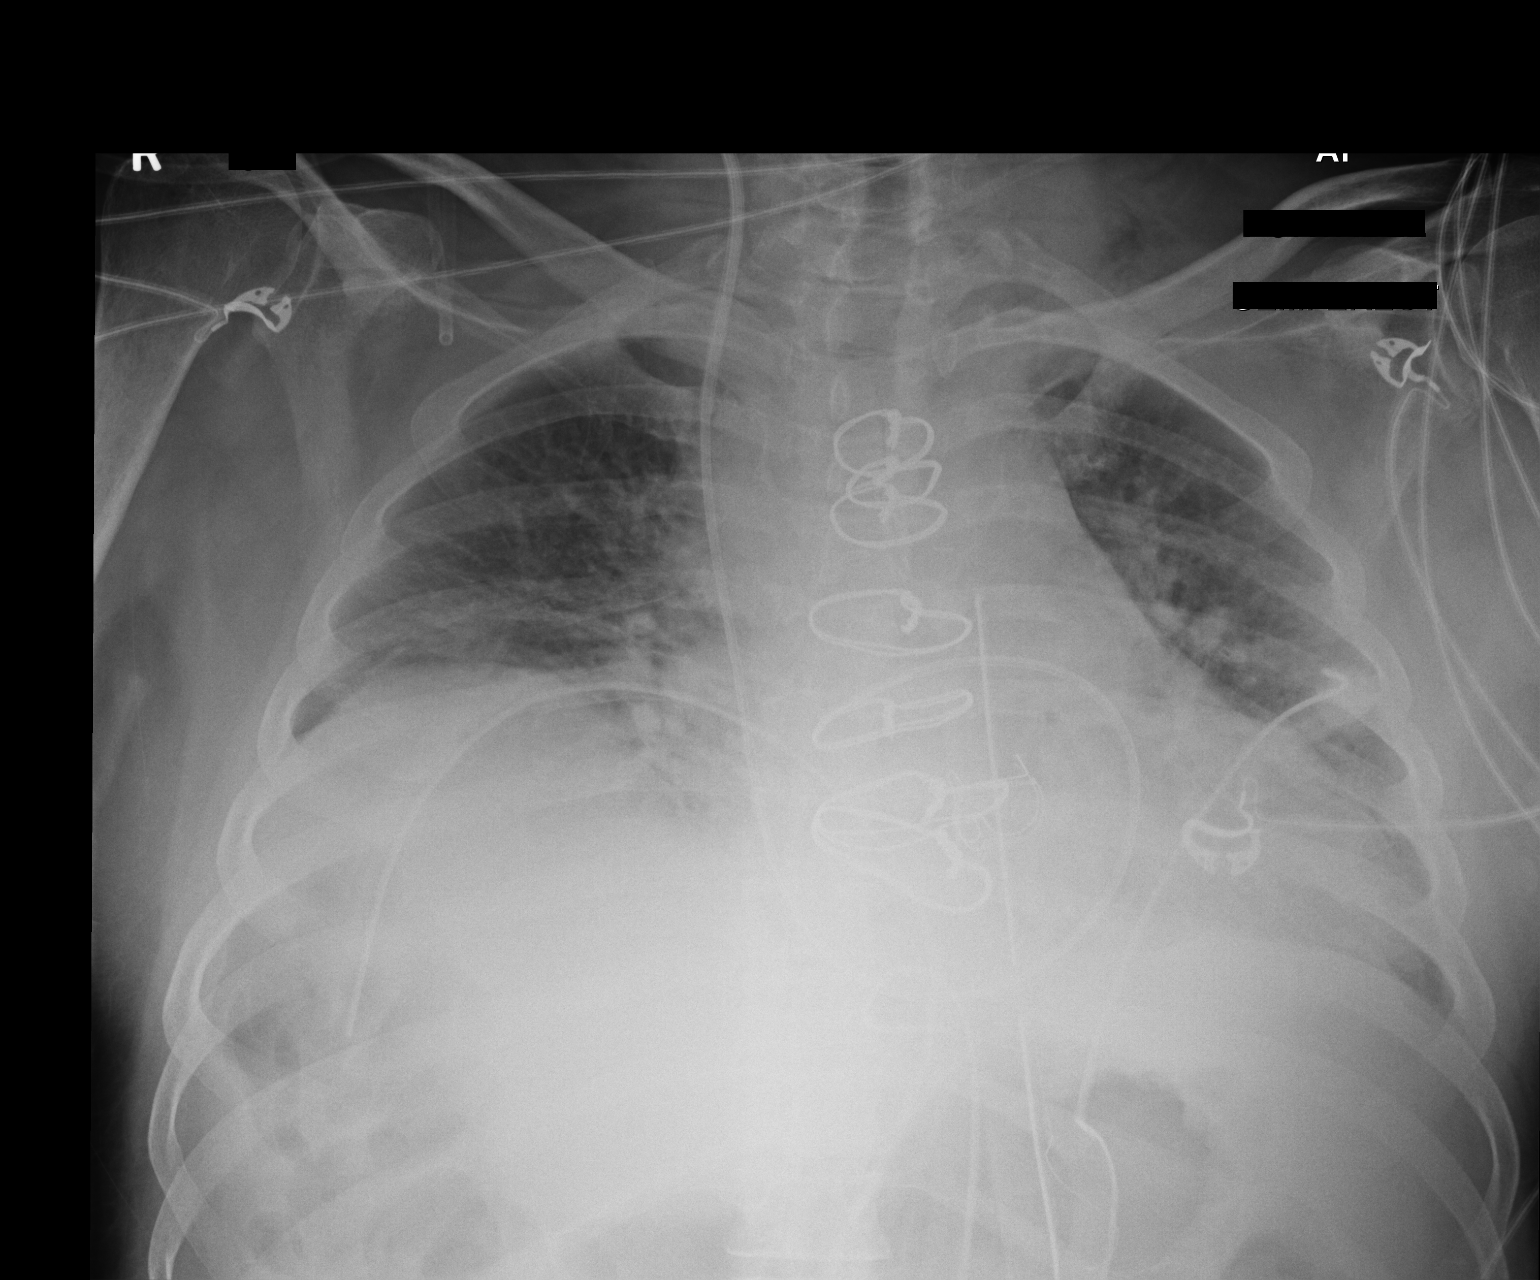

[1 of 1 positions shown; findings below may reference images not displayed]

FINDINGS: The lungs are not as well aerated with some increase in
bibasilar atelectasis.  Cardiomegaly and perhaps mild pulmonary
vascular congestion is noted.  Swan-Ganz catheter and left chest
tube remain, with the endotracheal tube having been removed.
IMPRESSION: Endotracheal tube removed.  Decreased aeration with increase in
basilar atelectasis.  Question mild pulmonary vascular congestion.

## 2013-12-09 ENCOUNTER — Other Ambulatory Visit: Payer: Self-pay | Admitting: *Deleted

## 2013-12-09 DIAGNOSIS — I10 Essential (primary) hypertension: Secondary | ICD-10-CM

## 2013-12-09 MED ORDER — HYDROCHLOROTHIAZIDE 12.5 MG PO CAPS: 12.5000 mg | ORAL_CAPSULE | Freq: Every day | ORAL | Status: DC

## 2014-01-15 NOTE — Progress Notes (Signed)
HPI: FU AVR. Previous AVR in 2006 with a porcine valve. Echocardiogram 11/29/11: Moderate LVH, EF 40-81%, grade 1 diastolic dysfunction, critical aortic stenosis with a mean gradient of 80 mmHg. LHC 11/28/11: Normal coronary arteries, severe aortic stenosis. Patient underwent redo aortic valve replacement with a pericardial tissue valve 12/06/11 with Dr. Darylene Price. Followup echocardiogram in December 2014 showed normal LV function, moderate left ventricular hypertrophy, grade 2 diastolic dysfunction, bioprosthetic aortic valve with mean gradient 16 mmHg and mild aortic insufficiency, mild biatrial enlargement and mild right ventricular enlargement. Since he was last seen, there is mild dyspnea on exertion but no orthopnea, PND, pedal edema or chest pain.  Current Outpatient Prescriptions  Medication Sig Dispense Refill  . aspirin 81 MG tablet Take 81 mg by mouth daily.    . carbidopa-levodopa (SINEMET) 25-100 MG per tablet Take 2 tablets by mouth daily with breakfast.     . carboxymethylcellulose 1 % ophthalmic solution Apply 1 drop to eye 3 (three) times daily as needed (dry eyes).    . Cholecalciferol (VITAMIN D) 2000 UNITS CAPS Take 1 capsule by mouth daily.    . cyclobenzaprine (FLEXERIL) 10 MG tablet Take 1 tablet by mouth as needed.  0  . docusate sodium (COLACE) 100 MG capsule Take 200 mg by mouth daily. For constipation    . finasteride (PROSCAR) 5 MG tablet Take 5 mg by mouth daily.  11  . fish oil-omega-3 fatty acids 1000 MG capsule Take 1 g by mouth 2 (two) times daily.     Marland Kitchen gabapentin (NEURONTIN) 800 MG tablet Take 800 mg by mouth 3 (three) times daily.    . hydrochlorothiazide (MICROZIDE) 12.5 MG capsule Take 1 capsule (12.5 mg total) by mouth daily. 30 capsule 5  . Hypromellose 0.3 % GEL Apply 1 drop to eye daily as needed (dry eyes).    Marland Kitchen losartan (COZAAR) 100 MG tablet Take 100 mg by mouth at bedtime.    . metFORMIN (GLUCOPHAGE) 500 MG tablet Take 500 mg by mouth 2  (two) times daily with a meal.    . methylcellulose (ARTIFICIAL TEARS) 1 % ophthalmic solution Place 1 drop into both eyes at bedtime as needed (dry eyes).    . metoprolol tartrate (LOPRESSOR) 50 MG tablet Take 1 tablet (50 mg total) by mouth 2 (two) times daily. 180 tablet 4  . pantoprazole (PROTONIX) 40 MG tablet Take 40 mg by mouth daily as needed. For GERD    . pentosan polysulfate (ELMIRON) 100 MG capsule Take 100 mg by mouth 3 (three) times daily.    Vladimir Faster Glycol-Propyl Glycol (SYSTANE ULTRA OP) Apply 1 drop to eye daily as needed (dry eyes).    . simvastatin (ZOCOR) 20 MG tablet Take 20 mg by mouth daily.    Marland Kitchen zolpidem (AMBIEN) 10 MG tablet Take 5 mg by mouth at bedtime.      No current facility-administered medications for this visit.     Past Medical History  Diagnosis Date  . HTN (hypertension)   . GERD (gastroesophageal reflux disease)     bravo pH study 2008  . Hyperlipidemia   . Hemorrhoids     external and internal  . Insomnia   . Allergic rhinitis   . OA (osteoarthritis)   . Hiatal hernia   . BPH (benign prostatic hypertrophy)   . Chronic cough   . Anemia 2009  . Diverticulosis of colon     on colonoscopy 2008  . OSA (obstructive sleep apnea)  USES CPAP AS NEEDED  . Headache(784.0)   . IBS (irritable bowel syndrome)   . Periodontitis     chronic with bone loss  . Gout   . Asthma   . Depression     pt denies  . Cataract   . Chronic interstitial cystitis   . Aortic stenosis     a. s/p porcine AVR 2006;  b. s/p redo tissue AVR 12/2011 (Dr. Roxy Manns) - preAVR LHC with no CAD  . H/O aortic valve replacement with porcine valve     2006, 2013  . Diabetes mellitus     type 2  . S/P aortic valve replacement with bioprosthetic valve 12/06/2011    Redo AVR using 23 mm [facility] Ease pericardial tissue valve  . Hx of echocardiogram     a. Echo  (post AVR) 12/2011:  mod LVH, EF 60-65%, Gr 2 diast dysfn, mild AI, AVR ok (mean gradient 19 mmHg), MAC, mild  BAE    Past Surgical History  Procedure Laterality Date  . Aortic valve replacement  10/13/2004    97mm Edwards Perimount pericardial tissue valve  . Nasal septoplasty w/ turbinoplasty    . Hernia repair    . Right knee arthroscopy    . Bunionectomy      right  . Cataract extraction  2009    &   2012    BILATERAL  . Esophagogastroduodenoscopy  08/30/2011    Procedure: ESOPHAGOGASTRODUODENOSCOPY (EGD);  Surgeon: Inda Castle, MD;  Location: [place];  Service: Endoscopy;  Laterality: N/A;  Rm 3005   . Colonoscopy  08/31/2011    Procedure: COLONOSCOPY;  Surgeon: Inda Castle, MD;  Location: [place];  Service: Endoscopy;  Laterality: N/A;  . Refractive surgery      bilateral  . Aortic valve replacement  12/06/2011    Procedure: REDO AORTIC VALVE REPLACEMENT (AVR);  Surgeon: Rexene Alberts, MD;  Location: [place];  Service: Open Heart Surgery;  Laterality: N/A;  . Left heart catheterization with coronary angiogram N/A 11/30/2011    Procedure: LEFT HEART CATHETERIZATION WITH CORONARY ANGIOGRAM;  Surgeon: Burnell Blanks, MD;  Location: [facility] CATH LAB;  Service: Cardiovascular;  Laterality: N/A;  . Right heart catheterization Bilateral 11/30/2011    Procedure: RIGHT HEART CATH;  Surgeon: Burnell Blanks, MD;  Location: [facility] CATH LAB;  Service: Cardiovascular;  Laterality: Bilateral;    History   Social History  . Marital Status: Married    Spouse Name: N/A    Number of Children: 0  . Years of Education: N/A   Occupational History  . retired    Social History Main Topics  . Smoking status: Former Smoker    Quit date: 09/30/1971  . Smokeless tobacco: Never Used  . Alcohol Use: No  . Drug Use: No  . Sexual Activity: Not Currently   Other Topics Concern  . Not on file   Social History Narrative    ROS: no fevers or chills, productive cough, hemoptysis, dysphasia, odynophagia, melena, hematochezia, dysuria, hematuria, rash, seizure activity, orthopnea,  PND, pedal edema, claudication. Remaining systems are negative.  Physical Exam: Well-developed well-nourished in no acute distress.  Skin is warm and dry.  HEENT is normal.  Neck is supple.  Chest is clear to auscultation with normal expansion.  Cardiovascular exam is regular rate and rhythm. 3/6 systolic murmur left sternal border. No diastolic murmur. Abdominal exam nontender or distended. No masses palpated. Extremities show no edema. neuro grossly intact  ECG sinus  rhythm at a rate of 55. Left axis deviation. Cannot rule out prior lateral infarct.

## 2014-01-16 ENCOUNTER — Encounter (HOSPITAL_COMMUNITY): Payer: Self-pay | Admitting: Cardiovascular Disease

## 2014-01-17 ENCOUNTER — Ambulatory Visit (INDEPENDENT_AMBULATORY_CARE_PROVIDER_SITE_OTHER): Payer: Medicare Other | Admitting: Cardiology

## 2014-01-17 ENCOUNTER — Encounter: Payer: Self-pay | Admitting: Cardiology

## 2014-01-17 VITALS — BP 118/82 | HR 55 | Ht 69.0 in | Wt 217.6 lb

## 2014-01-17 DIAGNOSIS — R054 Cough syncope: Secondary | ICD-10-CM

## 2014-01-17 DIAGNOSIS — I10 Essential (primary) hypertension: Secondary | ICD-10-CM | POA: Diagnosis not present

## 2014-01-17 DIAGNOSIS — I350 Nonrheumatic aortic (valve) stenosis: Secondary | ICD-10-CM | POA: Diagnosis not present

## 2014-01-17 DIAGNOSIS — Z954 Presence of other heart-valve replacement: Secondary | ICD-10-CM | POA: Diagnosis not present

## 2014-01-17 DIAGNOSIS — Z953 Presence of xenogenic heart valve: Secondary | ICD-10-CM

## 2014-01-17 DIAGNOSIS — R05 Cough: Secondary | ICD-10-CM | POA: Diagnosis not present

## 2014-01-17 NOTE — Assessment & Plan Note (Signed)
Management per primary care. 

## 2014-01-17 NOTE — Assessment & Plan Note (Addendum)
Blood pressure controlled. Continue present medications. We will have his most recent laboratories sent to [place] from the [place].

## 2014-01-17 NOTE — Assessment & Plan Note (Signed)
Continue SBE prophylaxis. 

## 2014-01-17 NOTE — Patient Instructions (Signed)
Your physician wants you to follow-up in: ONE YEAR WITH DR CRENSHAW You will receive a reminder letter in the mail two months in advance. If you don't receive a letter, please call our office to schedule the follow-up appointment.  

## 2014-01-17 NOTE — Assessment & Plan Note (Signed)
Patient had recent dizziness with laughing but has not had recurrent syncope.

## 2014-02-18 DIAGNOSIS — L821 Other seborrheic keratosis: Secondary | ICD-10-CM | POA: Diagnosis not present

## 2014-02-18 DIAGNOSIS — H6980 Other specified disorders of Eustachian tube, unspecified ear: Secondary | ICD-10-CM | POA: Diagnosis not present

## 2014-04-23 DIAGNOSIS — J0100 Acute maxillary sinusitis, unspecified: Secondary | ICD-10-CM | POA: Diagnosis not present

## 2014-04-28 ENCOUNTER — Telehealth: Payer: Self-pay | Admitting: *Deleted

## 2014-04-28 NOTE — Telephone Encounter (Signed)
Patient dropped off a letter asking to be dismissed from jury duty 06-09-24. Will forward for dr Stanford Breed review

## 2014-04-29 NOTE — Telephone Encounter (Signed)
From cardiac standpoint, no contraindication to jury duty. Kirk Ruths

## 2014-04-29 NOTE — Telephone Encounter (Signed)
Spoke with pt, Aware of dr Jacalyn Lefevre recommendations.  Letter placed at the front desk for patient to pick up.

## 2014-06-10 ENCOUNTER — Other Ambulatory Visit: Payer: Self-pay | Admitting: *Deleted

## 2014-06-10 DIAGNOSIS — I10 Essential (primary) hypertension: Secondary | ICD-10-CM

## 2014-06-10 MED ORDER — HYDROCHLOROTHIAZIDE 12.5 MG PO CAPS: 12.5000 mg | ORAL_CAPSULE | Freq: Every day | ORAL | Status: DC

## 2014-12-14 ENCOUNTER — Other Ambulatory Visit: Payer: Self-pay | Admitting: Cardiology

## 2014-12-15 ENCOUNTER — Encounter: Payer: Self-pay | Admitting: Cardiology

## 2014-12-15 ENCOUNTER — Other Ambulatory Visit: Payer: Self-pay | Admitting: *Deleted

## 2014-12-15 MED ORDER — HYDROCHLOROTHIAZIDE 12.5 MG PO CAPS: ORAL_CAPSULE | ORAL | Status: DC

## 2015-01-14 NOTE — Progress Notes (Signed)
HPI: FU AVR. Previous AVR in 2006 with a porcine valve. Echocardiogram 11/29/11: Moderate LVH, EF 123456, grade 1 diastolic dysfunction, critical aortic stenosis with a mean gradient of 80 mmHg. LHC 11/28/11: Normal coronary arteries, severe aortic stenosis. Patient underwent redo aortic valve replacement with a pericardial tissue valve 12/06/11 with Dr. Darylene Price. Followup echocardiogram in December 2014 showed normal LV function, moderate left ventricular hypertrophy, grade 2 diastolic dysfunction, bioprosthetic aortic valve with mean gradient 16 mmHg and mild aortic insufficiency, mild biatrial enlargement and mild right ventricular enlargement. Since he was last seen, He does note some dyspnea on exertion but no orthopnea, PND, pedal edema, exertional chest pain or syncope.  Current Outpatient Prescriptions  Medication Sig Dispense Refill  . aspirin 81 MG tablet Take 81 mg by mouth daily.    . carbidopa-levodopa (SINEMET) 25-100 MG per tablet Take 2 tablets by mouth daily with breakfast.     . carboxymethylcellulose 1 % ophthalmic solution Apply 1 drop to eye 3 (three) times daily as needed (dry eyes).    . Cholecalciferol (VITAMIN D) 2000 UNITS CAPS Take 1 capsule by mouth daily.    Marland Kitchen docusate sodium (COLACE) 100 MG capsule Take 200 mg by mouth daily. For constipation    . finasteride (PROSCAR) 5 MG tablet Take 5 mg by mouth daily.  11  . fish oil-omega-3 fatty acids 1000 MG capsule Take 1 g by mouth 2 (two) times daily.     Marland Kitchen gabapentin (NEURONTIN) 800 MG tablet Take 800 mg by mouth 4 (four) times daily.     . hydrochlorothiazide (MICROZIDE) 12.5 MG capsule TAKE 1 CAPSULE (12.5 MG TOTAL) BY MOUTH DAILY. 30 capsule 1  . Hypromellose 0.3 % GEL Apply 1 drop to eye daily as needed (dry eyes).    Marland Kitchen losartan (COZAAR) 100 MG tablet Take 100 mg by mouth at bedtime.    . metFORMIN (GLUCOPHAGE) 500 MG tablet Take 500 mg by mouth 2 (two) times daily with a meal.    . methylcellulose  (ARTIFICIAL TEARS) 1 % ophthalmic solution Place 1 drop into both eyes at bedtime as needed (dry eyes).    . metoprolol tartrate (LOPRESSOR) 50 MG tablet Take 1 tablet (50 mg total) by mouth 2 (two) times daily. 180 tablet 4  . pantoprazole (PROTONIX) 40 MG tablet Take 40 mg by mouth daily as needed. For GERD    . Polyethyl Glycol-Propyl Glycol (SYSTANE ULTRA OP) Apply 1 drop to eye daily as needed (dry eyes).    . simvastatin (ZOCOR) 20 MG tablet Take 20 mg by mouth daily.    Marland Kitchen zolpidem (AMBIEN) 10 MG tablet Take 5 mg by mouth at bedtime as needed for sleep.     . cyclobenzaprine (FLEXERIL) 10 MG tablet Take 1 tablet by mouth as needed for muscle spasms.   0  . pentosan polysulfate (ELMIRON) 100 MG capsule Take 100 mg by mouth 3 (three) times daily.     No current facility-administered medications for this visit.     Past Medical History  Diagnosis Date  . HTN (hypertension)   . GERD (gastroesophageal reflux disease)     bravo pH study 2008  . Hyperlipidemia   . Hemorrhoids     external and internal  . Insomnia   . Allergic rhinitis   . OA (osteoarthritis)   . Hiatal hernia   . BPH (benign prostatic hypertrophy)   . Chronic cough   . Anemia 2009  . Diverticulosis of colon  on colonoscopy 2008  . OSA (obstructive sleep apnea)     USES CPAP AS NEEDED  . Headache(784.0)   . IBS (irritable bowel syndrome)   . Periodontitis     chronic with bone loss  . Gout   . Asthma   . Depression     pt denies  . Cataract   . Chronic interstitial cystitis   . Aortic stenosis     a. s/p porcine AVR 2006;  b. s/p redo tissue AVR 12/2011 (Dr. Roxy Manns) - preAVR LHC with no CAD  . H/O aortic valve replacement with porcine valve     2006, 2013  . Diabetes mellitus     type 2  . S/P aortic valve replacement with bioprosthetic valve 12/06/2011    Redo AVR using 23 mm [facility] Ease pericardial tissue valve  . Hx of echocardiogram     a. Echo  (post AVR) 12/2011:  mod LVH, EF 60-65%, Gr  2 diast dysfn, mild AI, AVR ok (mean gradient 19 mmHg), MAC, mild BAE    Past Surgical History  Procedure Laterality Date  . Aortic valve replacement  10/13/2004    20mm Edwards Perimount pericardial tissue valve  . Nasal septoplasty w/ turbinoplasty    . Hernia repair    . Right knee arthroscopy    . Bunionectomy      right  . Cataract extraction  2009    &   2012    BILATERAL  . Esophagogastroduodenoscopy  08/30/2011    Procedure: ESOPHAGOGASTRODUODENOSCOPY (EGD);  Surgeon: Inda Castle, MD;  Location: [place];  Service: Endoscopy;  Laterality: N/A;  Rm 3005   . Colonoscopy  08/31/2011    Procedure: COLONOSCOPY;  Surgeon: Inda Castle, MD;  Location: [place];  Service: Endoscopy;  Laterality: N/A;  . Refractive surgery      bilateral  . Aortic valve replacement  12/06/2011    Procedure: REDO AORTIC VALVE REPLACEMENT (AVR);  Surgeon: Rexene Alberts, MD;  Location: [place];  Service: Open Heart Surgery;  Laterality: N/A;  . Left heart catheterization with coronary angiogram N/A 11/30/2011    Procedure: LEFT HEART CATHETERIZATION WITH CORONARY ANGIOGRAM;  Surgeon: Burnell Blanks, MD;  Location: [facility] CATH LAB;  Service: Cardiovascular;  Laterality: N/A;  . Right heart catheterization Bilateral 11/30/2011    Procedure: RIGHT HEART CATH;  Surgeon: Burnell Blanks, MD;  Location: [facility] CATH LAB;  Service: Cardiovascular;  Laterality: Bilateral;    Social History   Social History  . Marital Status: Married    Spouse Name: N/A  . Number of Children: 0  . Years of Education: N/A   Occupational History  . retired    Social History Main Topics  . Smoking status: Former Smoker    Quit date: 09/30/1971  . Smokeless tobacco: Never Used  . Alcohol Use: No  . Drug Use: No  . Sexual Activity: Not Currently   Other Topics Concern  . Not on file   Social History Narrative    ROS: Headaches but no fevers or chills, productive cough, hemoptysis, dysphasia,  odynophagia, melena, hematochezia, dysuria, hematuria, rash, seizure activity, orthopnea, PND, pedal edema, claudication. Remaining systems are negative.  Physical Exam: Well-developed well-nourished in no acute distress.  Skin is warm and dry.  HEENT is normal.  Neck is supple.  Chest is clear to auscultation with normal expansion.  Cardiovascular exam is regular rate and rhythm. 3/6 systolic murmur left sternal border. No diastolic murmur. Abdominal exam nontender  or distended. No masses palpated. Positive bruit Extremities show no edema. neuro grossly intact  ECG Sinus bradycardia at a rate of 54. Nonspecific ST changes.

## 2015-01-19 ENCOUNTER — Ambulatory Visit (INDEPENDENT_AMBULATORY_CARE_PROVIDER_SITE_OTHER): Payer: Medicare Other | Admitting: Cardiology

## 2015-01-19 ENCOUNTER — Encounter: Payer: Self-pay | Admitting: Cardiology

## 2015-01-19 VITALS — BP 150/86 | HR 54 | Ht 69.0 in | Wt 212.8 lb

## 2015-01-19 DIAGNOSIS — R0989 Other specified symptoms and signs involving the circulatory and respiratory systems: Secondary | ICD-10-CM | POA: Insufficient documentation

## 2015-01-19 DIAGNOSIS — Z954 Presence of other heart-valve replacement: Secondary | ICD-10-CM

## 2015-01-19 DIAGNOSIS — I10 Essential (primary) hypertension: Secondary | ICD-10-CM

## 2015-01-19 DIAGNOSIS — Z952 Presence of prosthetic heart valve: Secondary | ICD-10-CM

## 2015-01-19 DIAGNOSIS — E785 Hyperlipidemia, unspecified: Secondary | ICD-10-CM | POA: Diagnosis not present

## 2015-01-19 DIAGNOSIS — Z79899 Other long term (current) drug therapy: Secondary | ICD-10-CM

## 2015-01-19 MED ORDER — PRAVASTATIN SODIUM 40 MG PO TABS: 40.0000 mg | ORAL_TABLET | Freq: Every evening | ORAL | Status: DC

## 2015-01-19 MED ORDER — AMLODIPINE BESYLATE 5 MG PO TABS: 5.0000 mg | ORAL_TABLET | Freq: Every day | ORAL | Status: DC

## 2015-01-19 NOTE — Patient Instructions (Signed)
Medication Instructions:  STOP Simvastatin START Amlodipine 5 mg - take 1 tablet by mouth daily START Pravastatin 40 mg - take 1 tablet by mouth daily  >>New prescriptions have been sent to the pharmacy electronically.  Labwork: Your physician recommends that you return for lab work in [place].  Testing/Procedures: 1. 2D Echocardiogram - Your physician has requested that you have an echocardiogram. Echocardiography is a painless test that uses sound waves to create images of your heart. It provides your doctor with information about the size and shape of your heart and how well your heart's chambers and valves are working. This procedure takes approximately one hour. There are no restrictions for this procedure.  2. Abdominal ultrasound - Your physician has requested that you have an abdominal aorta duplex. During this test, an ultrasound is used to evaluate the aorta. Allow 30 minutes for this exam. Do not eat after midnight the day before and avoid carbonated beverages.  Follow-Up: Dr Stanford Breed recommends that you schedule a follow-up appointment in 1 year. You will receive a reminder letter in the mail two months in advance. If you don't receive a letter, please call our office to schedule the follow-up appointment.  If you need a refill on your cardiac medications before your next appointment, please call your pharmacy.

## 2015-01-19 NOTE — Assessment & Plan Note (Signed)
Scheduled abdominal ultrasound to exclude aneurysm. 

## 2015-01-19 NOTE — Assessment & Plan Note (Addendum)
Continue present medications. Blood pressure elevated. Add Norvasc 5 mg daily and follow.

## 2015-01-19 NOTE — Assessment & Plan Note (Signed)
No recurrent episodes. 

## 2015-01-19 NOTE — Assessment & Plan Note (Addendum)
Given the addition of Norvasc I will discontinue Zocor. Begin Pravachol 40 mg daily. Check lipids and liver in 4 weeks.

## 2015-01-19 NOTE — Assessment & Plan Note (Addendum)
Continue SBE prophylaxis. Patient is complaining of dyspnea. Repeat echocardiogram.

## 2015-01-20 ENCOUNTER — Ambulatory Visit: Payer: Self-pay | Admitting: Cardiology

## 2015-01-27 ENCOUNTER — Inpatient Hospital Stay (HOSPITAL_COMMUNITY): Admission: RE | Admit: 2015-01-27 | Payer: Self-pay | Source: Ambulatory Visit

## 2015-01-31 ENCOUNTER — Encounter (HOSPITAL_COMMUNITY): Payer: Self-pay

## 2015-01-31 ENCOUNTER — Emergency Department (HOSPITAL_COMMUNITY)
Admission: EM | Admit: 2015-01-31 | Discharge: 2015-01-31 | Disposition: A | Discharge status: Home / Self Care | Payer: Medicare Other | Attending: Emergency Medicine | Admitting: Emergency Medicine

## 2015-01-31 DIAGNOSIS — J329 Chronic sinusitis, unspecified: Secondary | ICD-10-CM | POA: Diagnosis not present

## 2015-01-31 DIAGNOSIS — Z87891 Personal history of nicotine dependence: Secondary | ICD-10-CM | POA: Insufficient documentation

## 2015-01-31 DIAGNOSIS — Y998 Other external cause status: Secondary | ICD-10-CM | POA: Diagnosis not present

## 2015-01-31 DIAGNOSIS — X58XXXA Exposure to other specified factors, initial encounter: Secondary | ICD-10-CM | POA: Diagnosis not present

## 2015-01-31 DIAGNOSIS — J45909 Unspecified asthma, uncomplicated: Secondary | ICD-10-CM | POA: Diagnosis not present

## 2015-01-31 DIAGNOSIS — R509 Fever, unspecified: Secondary | ICD-10-CM | POA: Diagnosis present

## 2015-01-31 DIAGNOSIS — Y9389 Activity, other specified: Secondary | ICD-10-CM | POA: Insufficient documentation

## 2015-01-31 DIAGNOSIS — Z8669 Personal history of other diseases of the nervous system and sense organs: Secondary | ICD-10-CM | POA: Diagnosis not present

## 2015-01-31 DIAGNOSIS — Y9289 Other specified places as the place of occurrence of the external cause: Secondary | ICD-10-CM | POA: Insufficient documentation

## 2015-01-31 DIAGNOSIS — E119 Type 2 diabetes mellitus without complications: Secondary | ICD-10-CM | POA: Diagnosis not present

## 2015-01-31 DIAGNOSIS — T783XXA Angioneurotic edema, initial encounter: Secondary | ICD-10-CM | POA: Diagnosis not present

## 2015-01-31 DIAGNOSIS — I10 Essential (primary) hypertension: Secondary | ICD-10-CM | POA: Insufficient documentation

## 2015-01-31 HISTORY — DX: Type 2 diabetes mellitus without complications: E11.9

## 2015-01-31 HISTORY — DX: Essential (primary) hypertension: I10

## 2015-01-31 HISTORY — DX: Restless legs syndrome: G25.81

## 2015-01-31 HISTORY — DX: Thyrotoxicosis, unspecified without thyrotoxic crisis or storm: E05.90

## 2015-01-31 LAB — CBG MONITORING, ED: GLUCOSE-CAPILLARY: 159 mg/dL — AB (ref 65–99)

## 2015-01-31 MED ORDER — METHYLPREDNISOLONE SODIUM SUCC 125 MG IJ SOLR
125.0000 mg | Freq: Once | INTRAMUSCULAR | Status: AC
  Administered 2015-01-31: 125 mg via INTRAVENOUS
  Filled 2015-01-31: qty 2

## 2015-01-31 MED ORDER — FAMOTIDINE 20 MG PO TABS: 20.0000 mg | ORAL_TABLET | Freq: Two times a day (BID) | ORAL | Status: DC

## 2015-01-31 MED ORDER — FAMOTIDINE IN NACL 20-0.9 MG/50ML-% IV SOLN
20.0000 mg | Freq: Once | INTRAVENOUS | Status: AC
  Administered 2015-01-31: 20 mg via INTRAVENOUS
  Filled 2015-01-31: qty 50

## 2015-01-31 MED ORDER — PREDNISONE 10 MG (21) PO TBPK: 10.0000 mg | ORAL_TABLET | Freq: Every day | ORAL | Status: DC

## 2015-01-31 MED ORDER — DIPHENHYDRAMINE HCL 50 MG/ML IJ SOLN
50.0000 mg | Freq: Once | INTRAMUSCULAR | Status: AC
  Administered 2015-01-31: 50 mg via INTRAVENOUS
  Filled 2015-01-31: qty 1

## 2015-01-31 NOTE — ED Notes (Signed)
Pt ambulates to br with steady gait.

## 2015-01-31 NOTE — ED Provider Notes (Signed)
TIME SEEN: 5:50 AM  CHIEF COMPLAINT: Tongue swelling  HPI: Pt is a 69 y.o. male with history of hypertension, diabetes who presents to emergency department with complaints of tongue swelling for the past 48 hours. Feels like it has progressively worsened. He chronically has some dysarthria that is unchanged. States he felt flushed earlier today. Per nursing notes it states patient complained of fever which is not true. He states he never felt like he had fever just that he felt warm and flushed. No lip swelling. No difficulty speaking other than his chronic mild dysarthria. No difficulty swallowing or breathing. No wheezing. No rash. States over the past week he has had some sinus pressure, sore throat and runny nose and was recently started on loratadine and pseudoephedrine 4 days ago. No other new medications. He is on losartan but no Ace inhibitors. Has never had similar symptoms.  ROS: See HPI Constitutional: no fever  Eyes: no drainage  ENT: no runny nose   Cardiovascular:  no chest pain  Resp: no SOB  GI: no vomiting GU: no dysuria Integumentary: no rash  Allergy: no hives  Musculoskeletal: no leg swelling  Neurological: no slurred speech ROS otherwise negative  PAST MEDICAL HISTORY/PAST SURGICAL HISTORY:  Past Medical History  Diagnosis Date  . Diabetes mellitus without complication ([place])   . Hypertension   . Hyperthyroidism   . Restless leg syndrome     MEDICATIONS:  Prior to Admission medications   Not on File    ALLERGIES:  Allergies  Allergen Reactions  . Spironolactone Other (See Comments)    Low tolerance   . Avodart [Dutasteride] Other (See Comments)    Lose of use of arms and legs   . Diazepam Other (See Comments)    Mood changes   . Doxazosin Other (See Comments)    Dizziness     SOCIAL HISTORY:  Social History  Substance Use Topics  . Smoking status: Former Research scientist (life sciences)  . Smokeless tobacco: Not on file  . Alcohol Use: No    FAMILY HISTORY: No  family history on file.  EXAM: BP 172/90 mmHg  Pulse 74  Temp(Src) 98 F (36.7 C) (Oral)  Resp 18  Ht 5\' 10"  (1.778 m)  Wt 212 lb (96.163 kg)  BMI 30.42 kg/m2  SpO2 97% CONSTITUTIONAL: Alert and oriented and responds appropriately to questions. Well-appearing; well-nourished HEAD: Normocephalic EYES: Conjunctivae clear, PERRL ENT: normal nose; no rhinorrhea; moist mucous membranes; pharynx without lesions noted; no tonsillar hypertrophy or exudate, no uvular deviation, no trismus or drooling, no stridor, no dental caries or abscess noted, no Ludwig's angina, tongue sits flat in the bottom of the mouth, tongue may be mildly swollen but no swelling of the lips, he does have some dysarthria which he reports is chronic but otherwise normal phonation NECK: Supple, no meningismus, no LAD  CARD: RRR; S1 and S2 appreciated; no murmurs, no clicks, no rubs, no gallops RESP: Normal chest excursion without splinting or tachypnea; breath sounds clear and equal bilaterally; no wheezes, no rhonchi, no rales, no hypoxia or respiratory distress, speaking full sentences ABD/GI: Normal bowel sounds; non-distended; soft, non-tender, no rebound, no guarding, no peritoneal signs BACK:  The back appears normal and is non-tender to palpation, there is no CVA tenderness EXT: Normal ROM in all joints; non-tender to palpation; no edema; normal capillary refill; no cyanosis, no calf tenderness or swelling    SKIN: Normal color for age and race; warm, no urticaria or rash NEURO: Moves all extremities equally,  sensation to light touch intact diffusely, cranial nerves II through XII intact PSYCH: The patient's mood and manner are appropriate. Grooming and personal hygiene are appropriate.  MEDICAL DECISION MAKING: Patient here with complaints of tongue swelling. It is difficult to truly assess of his time is swollen on exam. There is no sign of infection of the tongue. No sign of Ludwig's angina. He has mild dysarthria  which wife reports is chronic. He states he feels like his tongue is "thicker than normal". No chest pain or shortness of breath. No other sign of allergic reaction. Was started on Claritin and pseudoephedrine 2 days prior to the symptoms started. We'll have him stop these medications and we'll give Solu-Medrol, Benadryl and Pepcid and reassess.  ED PROGRESS: Patient monitored for over one hour and reports that his symptoms are improving. It does seem to his dysarthria is mildly improved. Given that this is been a slowly progressive process over 48 hours and very mild at best on exam I feel he is safe to be discharged without further observation. We'll discharge with prednisone taper, Pepcid and instructions to use Benadryl for the next 2 days scheduled and then as needed. Discussed with him and wife at length return precautions. I do not feel at this time he needs to go home on an epinephrine pen given again his symptoms are very very mild. Patient and wife verbalize understanding and are comfortable with this plan.   Also discussed with family that I do not see any sign of strep pharyngitis, deep space neck infection, peritonsillar abscess. No signs of meningitis. Discussed he does not need antibiotics with uncomplicated sinusitis.     [place], DO 01/31/15 [phone]

## 2015-01-31 NOTE — ED Notes (Signed)
Pt states that he has recently had tongue swelling that has been going on for the past few days, airway intact, was seen by PCP on Tuesday and had recently changes in medications. Pt also c/o fever today. Temp 98

## 2015-01-31 NOTE — Discharge Instructions (Signed)
Please stop the pseudoephedrine and loratadine that you were provided by your primary care physician as this may have caused an allergic reaction. Please take Benadryl 50 mg every 8 hours for the next 2 days and then as needed every 8 hours. We are discharging you on steroids for possible allergic reaction so you will need to monitor your blood glucose closely. You do not need to be on antibiotics for your sinusitis. If you develop increasing swelling of your tongue or lips, difficulty breathing, speaking or swallowing, wheezing or rash with these symptoms, please call 911.  Angioedema Angioedema is a sudden swelling of tissues, often of the skin. It can occur on the face or genitals or in the abdomen or other body parts. The swelling usually develops over a short period and gets better in 24 to 48 hours. It often begins during the night and is found when the person wakes up. The person may also get red, itchy patches of skin (hives). Angioedema can be dangerous if it involves swelling of the air passages.  Depending on the cause, episodes of angioedema may only happen once, come back in unpredictable patterns, or repeat for several years and then gradually fade away.  CAUSES  Angioedema can be caused by an allergic reaction to various triggers. It can also result from nonallergic causes, including reactions to drugs, immune system disorders, viral infections, or an abnormal gene that is passed to you from your parents (hereditary). For some people with angioedema, the cause is unknown.  Some things that can trigger angioedema include:   Foods.   Medicines, such as ACE inhibitors, ARBs, nonsteroidal anti-inflammatory agents, or estrogen.   Latex.   Animal saliva.   Insect stings.   Dyes used in X-rays.   Mild injury.   Dental work.  Surgery.  Stress.   Sudden changes in temperature.   Exercise. SIGNS AND SYMPTOMS   Swelling of the skin.  Hives. If these are present, there  is also intense itching.  Redness in the affected area.   Pain in the affected area.  Swollen lips or tongue.  Breathing problems. This may happen if the air passages swell.  Wheezing. If internal organs are involved, there may be:   Nausea.   Abdominal pain.   Vomiting.   Difficulty swallowing.   Difficulty passing urine. DIAGNOSIS   Your health care provider will examine the affected area and take a medical and family history.  Various tests may be done to help determine the cause. Tests may include:  Allergy skin tests to see if the problem is an allergic reaction.   Blood tests to check for hereditary angioedema.   Tests to check for underlying diseases that could cause the condition.   A review of your medicines, including over-the-counter medicines, may be done. TREATMENT  Treatment will depend on the cause of the angioedema. Possible treatments include:   Removal of anything that triggered the condition (such as stopping certain medicines).   Medicines to treat symptoms or prevent attacks. Medicines given may include:   Antihistamines.   Epinephrine injection.   Steroids.   Hospitalization may be required for severe attacks. If the air passages are affected, it can be an emergency. Tubes may need to be placed to keep the airway open. HOME CARE INSTRUCTIONS   Take all medicines as directed by your health care provider.  If you were given medicines for emergency allergy treatment, always carry them with you.  Wear a medical bracelet as directed  by your health care provider.   Avoid known triggers. SEEK MEDICAL CARE IF:   You have repeat attacks of angioedema.   Your attacks are more frequent or more severe despite preventive measures.   You have hereditary angioedema and are considering having children. It is important to discuss with your health care provider the risks of passing the condition on to your children. SEEK IMMEDIATE  MEDICAL CARE IF:   You have severe swelling of the mouth, tongue, or lips.  You have difficulty breathing.   You have difficulty swallowing.   You faint. MAKE SURE YOU:  Understand these instructions.  Will watch your condition.  Will get help right away if you are not doing well or get worse.   This information is not intended to replace advice given to you by your health care provider. Make sure you discuss any questions you have with your health care provider.   Document Released: 04/04/2001 Document Revised: 02/14/2014 Document Reviewed: 09/17/2012 Elsevier Interactive Patient Education [employer].

## 2015-01-31 NOTE — ED Notes (Signed)
Pt taking po fluids and tolerating well. 

## 2015-01-31 NOTE — ED Notes (Signed)
Pt verbalizes understanding of instruction. 

## 2015-02-03 ENCOUNTER — Encounter: Payer: Self-pay | Admitting: Cardiology

## 2015-02-04 ENCOUNTER — Other Ambulatory Visit: Payer: Self-pay

## 2015-02-04 ENCOUNTER — Ambulatory Visit (HOSPITAL_COMMUNITY): Payer: Medicare Other | Attending: Cardiovascular Disease

## 2015-02-04 DIAGNOSIS — I351 Nonrheumatic aortic (valve) insufficiency: Secondary | ICD-10-CM | POA: Diagnosis not present

## 2015-02-04 DIAGNOSIS — G4733 Obstructive sleep apnea (adult) (pediatric): Secondary | ICD-10-CM | POA: Insufficient documentation

## 2015-02-04 DIAGNOSIS — I517 Cardiomegaly: Secondary | ICD-10-CM | POA: Diagnosis not present

## 2015-02-04 DIAGNOSIS — I10 Essential (primary) hypertension: Secondary | ICD-10-CM | POA: Insufficient documentation

## 2015-02-04 DIAGNOSIS — E785 Hyperlipidemia, unspecified: Secondary | ICD-10-CM | POA: Insufficient documentation

## 2015-02-04 DIAGNOSIS — I071 Rheumatic tricuspid insufficiency: Secondary | ICD-10-CM | POA: Diagnosis not present

## 2015-02-04 DIAGNOSIS — I359 Nonrheumatic aortic valve disorder, unspecified: Secondary | ICD-10-CM | POA: Diagnosis not present

## 2015-02-04 DIAGNOSIS — Z954 Presence of other heart-valve replacement: Secondary | ICD-10-CM

## 2015-02-04 DIAGNOSIS — Z87891 Personal history of nicotine dependence: Secondary | ICD-10-CM | POA: Diagnosis not present

## 2015-02-04 DIAGNOSIS — I059 Rheumatic mitral valve disease, unspecified: Secondary | ICD-10-CM | POA: Insufficient documentation

## 2015-02-04 DIAGNOSIS — Z952 Presence of prosthetic heart valve: Secondary | ICD-10-CM

## 2015-02-12 ENCOUNTER — Ambulatory Visit (HOSPITAL_COMMUNITY)
Admission: RE | Admit: 2015-02-12 | Discharge: 2015-02-12 | Disposition: A | Discharge status: Home / Self Care | Payer: Medicare Other | Source: Ambulatory Visit | Attending: Cardiology | Admitting: Cardiology

## 2015-02-12 DIAGNOSIS — R0989 Other specified symptoms and signs involving the circulatory and respiratory systems: Secondary | ICD-10-CM | POA: Insufficient documentation

## 2015-02-12 DIAGNOSIS — Z79899 Other long term (current) drug therapy: Secondary | ICD-10-CM | POA: Diagnosis not present

## 2015-02-12 DIAGNOSIS — I10 Essential (primary) hypertension: Secondary | ICD-10-CM | POA: Diagnosis not present

## 2015-02-12 DIAGNOSIS — E119 Type 2 diabetes mellitus without complications: Secondary | ICD-10-CM | POA: Diagnosis not present

## 2015-02-12 DIAGNOSIS — E785 Hyperlipidemia, unspecified: Secondary | ICD-10-CM | POA: Diagnosis not present

## 2015-02-12 DIAGNOSIS — I700 Atherosclerosis of aorta: Secondary | ICD-10-CM | POA: Insufficient documentation

## 2015-02-12 LAB — HEPATIC FUNCTION PANEL
ALBUMIN: 3.6 g/dL (ref 3.6–5.1)
ALK PHOS: 75 U/L (ref 40–115)
ALT: 10 U/L (ref 9–46)
AST: 28 U/L (ref 10–35)
BILIRUBIN DIRECT: 0.2 mg/dL (ref <=0.2)
BILIRUBIN INDIRECT: 0.2 mg/dL (ref 0.2–1.2)
BILIRUBIN TOTAL: 0.4 mg/dL (ref 0.2–1.2)
TOTAL PROTEIN: 7.9 g/dL (ref 6.1–8.1)

## 2015-02-12 LAB — LIPID PANEL
Cholesterol: 133 mg/dL (ref 125–200)
HDL: 38 mg/dL — AB (ref >=40)
LDL CALC: 36 mg/dL (ref <130)
TRIGLYCERIDES: 293 mg/dL — AB (ref <150)
Total CHOL/HDL Ratio: 3.5 Ratio (ref <=5.0)
VLDL: 59 mg/dL — AB (ref <30)

## 2015-03-10 ENCOUNTER — Other Ambulatory Visit: Payer: Self-pay | Admitting: Cardiology

## 2015-03-10 NOTE — Telephone Encounter (Signed)
Rx request sent to pharmacy.  

## 2015-03-17 ENCOUNTER — Inpatient Hospital Stay (HOSPITAL_COMMUNITY): Payer: Medicare Other

## 2015-03-17 ENCOUNTER — Encounter (HOSPITAL_COMMUNITY): Payer: Self-pay

## 2015-03-17 ENCOUNTER — Inpatient Hospital Stay (HOSPITAL_COMMUNITY)
Admission: EM | Admit: 2015-03-17 | Discharge: 2015-03-18 | DRG: 312 | Disposition: A | Discharge status: Home / Self Care | Payer: Medicare Other | Attending: Internal Medicine | Admitting: Internal Medicine

## 2015-03-17 DIAGNOSIS — J45909 Unspecified asthma, uncomplicated: Secondary | ICD-10-CM | POA: Diagnosis not present

## 2015-03-17 DIAGNOSIS — G4733 Obstructive sleep apnea (adult) (pediatric): Secondary | ICD-10-CM | POA: Diagnosis present

## 2015-03-17 DIAGNOSIS — K219 Gastro-esophageal reflux disease without esophagitis: Secondary | ICD-10-CM | POA: Diagnosis present

## 2015-03-17 DIAGNOSIS — R55 Syncope and collapse: Secondary | ICD-10-CM | POA: Insufficient documentation

## 2015-03-17 DIAGNOSIS — I951 Orthostatic hypotension: Secondary | ICD-10-CM | POA: Diagnosis present

## 2015-03-17 DIAGNOSIS — I350 Nonrheumatic aortic (valve) stenosis: Secondary | ICD-10-CM | POA: Diagnosis present

## 2015-03-17 DIAGNOSIS — M1990 Unspecified osteoarthritis, unspecified site: Secondary | ICD-10-CM | POA: Diagnosis present

## 2015-03-17 DIAGNOSIS — Z885 Allergy status to narcotic agent status: Secondary | ICD-10-CM | POA: Diagnosis not present

## 2015-03-17 DIAGNOSIS — Z888 Allergy status to other drugs, medicaments and biological substances status: Secondary | ICD-10-CM | POA: Diagnosis not present

## 2015-03-17 DIAGNOSIS — K649 Unspecified hemorrhoids: Secondary | ICD-10-CM | POA: Diagnosis not present

## 2015-03-17 DIAGNOSIS — E785 Hyperlipidemia, unspecified: Secondary | ICD-10-CM | POA: Diagnosis present

## 2015-03-17 DIAGNOSIS — M109 Gout, unspecified: Secondary | ICD-10-CM | POA: Diagnosis present

## 2015-03-17 DIAGNOSIS — D62 Acute posthemorrhagic anemia: Secondary | ICD-10-CM | POA: Diagnosis present

## 2015-03-17 DIAGNOSIS — D472 Monoclonal gammopathy: Secondary | ICD-10-CM | POA: Diagnosis present

## 2015-03-17 DIAGNOSIS — Z7984 Long term (current) use of oral hypoglycemic drugs: Secondary | ICD-10-CM | POA: Diagnosis not present

## 2015-03-17 DIAGNOSIS — Z96659 Presence of unspecified artificial knee joint: Secondary | ICD-10-CM | POA: Diagnosis present

## 2015-03-17 DIAGNOSIS — D589 Hereditary hemolytic anemia, unspecified: Secondary | ICD-10-CM | POA: Diagnosis present

## 2015-03-17 DIAGNOSIS — Z79899 Other long term (current) drug therapy: Secondary | ICD-10-CM

## 2015-03-17 DIAGNOSIS — K589 Irritable bowel syndrome without diarrhea: Secondary | ICD-10-CM | POA: Diagnosis present

## 2015-03-17 DIAGNOSIS — E119 Type 2 diabetes mellitus without complications: Secondary | ICD-10-CM | POA: Diagnosis present

## 2015-03-17 DIAGNOSIS — G2581 Restless legs syndrome: Secondary | ICD-10-CM | POA: Diagnosis present

## 2015-03-17 DIAGNOSIS — Z953 Presence of xenogenic heart valve: Secondary | ICD-10-CM | POA: Diagnosis not present

## 2015-03-17 DIAGNOSIS — C9000 Multiple myeloma not having achieved remission: Secondary | ICD-10-CM | POA: Diagnosis present

## 2015-03-17 DIAGNOSIS — Z87891 Personal history of nicotine dependence: Secondary | ICD-10-CM | POA: Diagnosis not present

## 2015-03-17 DIAGNOSIS — I5032 Chronic diastolic (congestive) heart failure: Secondary | ICD-10-CM | POA: Diagnosis present

## 2015-03-17 DIAGNOSIS — K922 Gastrointestinal hemorrhage, unspecified: Secondary | ICD-10-CM | POA: Diagnosis present

## 2015-03-17 DIAGNOSIS — D649 Anemia, unspecified: Secondary | ICD-10-CM

## 2015-03-17 DIAGNOSIS — I10 Essential (primary) hypertension: Secondary | ICD-10-CM | POA: Diagnosis present

## 2015-03-17 DIAGNOSIS — M899 Disorder of bone, unspecified: Secondary | ICD-10-CM | POA: Diagnosis not present

## 2015-03-17 DIAGNOSIS — Z954 Presence of other heart-valve replacement: Secondary | ICD-10-CM | POA: Diagnosis not present

## 2015-03-17 DIAGNOSIS — I959 Hypotension, unspecified: Secondary | ICD-10-CM | POA: Diagnosis not present

## 2015-03-17 DIAGNOSIS — Z7982 Long term (current) use of aspirin: Secondary | ICD-10-CM

## 2015-03-17 LAB — HEPATIC FUNCTION PANEL
ALBUMIN: 3.4 g/dL — AB (ref 3.5–5.0)
ALK PHOS: 65 U/L (ref 38–126)
ALT: 14 U/L — AB (ref 17–63)
AST: 32 U/L (ref 15–41)
BILIRUBIN INDIRECT: 1.3 mg/dL — AB (ref 0.3–0.9)
Bilirubin, Direct: 0.3 mg/dL (ref 0.1–0.5)
TOTAL PROTEIN: 8.1 g/dL (ref 6.5–8.1)
Total Bilirubin: 1.6 mg/dL — ABNORMAL HIGH (ref 0.3–1.2)

## 2015-03-17 LAB — CBG MONITORING, ED
Glucose-Capillary: 113 mg/dL — ABNORMAL HIGH (ref 65–99)
Glucose-Capillary: 106 mg/dL — ABNORMAL HIGH (ref 65–99)

## 2015-03-17 LAB — BASIC METABOLIC PANEL
Anion gap: 6 (ref 5–15)
BUN: 11 mg/dL (ref 6–20)
CO2: 25 mmol/L (ref 22–32)
Calcium: 9.8 mg/dL (ref 8.9–10.3)
Chloride: 102 mmol/L (ref 101–111)
Creatinine, Ser: 0.69 mg/dL (ref 0.61–1.24)
GFR calc Af Amer: >60 mL/min (ref >60)
GFR calc non Af Amer: >60 mL/min (ref >60)
Glucose, Bld: 175 mg/dL — ABNORMAL HIGH (ref 65–99)
Potassium: 4 mmol/L (ref 3.5–5.1)
Sodium: 133 mmol/L — ABNORMAL LOW (ref 135–145)

## 2015-03-17 LAB — CBC WITH DIFFERENTIAL/PLATELET
Basophils Absolute: 0 10*3/uL (ref 0.0–0.1)
Basophils Relative: 0 %
Eosinophils Absolute: 0.1 10*3/uL (ref 0.0–0.7)
Eosinophils Relative: 1 %
HCT: 22.7 % — ABNORMAL LOW (ref 39.0–52.0)
Hemoglobin: 7.3 g/dL — ABNORMAL LOW (ref 13.0–17.0)
Lymphocytes Relative: 19 %
Lymphs Abs: 1 10*3/uL (ref 0.7–4.0)
MCH: 32.4 pg (ref 26.0–34.0)
MCHC: 32.2 g/dL (ref 30.0–36.0)
MCV: 100.9 fL — ABNORMAL HIGH (ref 78.0–100.0)
Monocytes Absolute: 0.4 10*3/uL (ref 0.1–1.0)
Monocytes Relative: 8 %
Neutro Abs: 3.7 10*3/uL (ref 1.7–7.7)
Neutrophils Relative %: 72 %
Platelets: 146 10*3/uL — ABNORMAL LOW (ref 150–400)
RBC: 2.25 MIL/uL — ABNORMAL LOW (ref 4.22–5.81)
RDW: 15.6 % — ABNORMAL HIGH (ref 11.5–15.5)
WBC: 5.2 10*3/uL (ref 4.0–10.5)

## 2015-03-17 LAB — URINALYSIS, ROUTINE W REFLEX MICROSCOPIC
Bilirubin Urine: NEGATIVE
Glucose, UA: NEGATIVE mg/dL
Hgb urine dipstick: NEGATIVE
Ketones, ur: NEGATIVE mg/dL
LEUKOCYTES UA: NEGATIVE
NITRITE: NEGATIVE
PH: 6 (ref 5.0–8.0)
Protein, ur: NEGATIVE mg/dL
SPECIFIC GRAVITY, URINE: 1.014 (ref 1.005–1.030)

## 2015-03-17 LAB — PREPARE RBC (CROSSMATCH)

## 2015-03-17 LAB — SAVE SMEAR

## 2015-03-17 LAB — POC OCCULT BLOOD, ED: Fecal Occult Bld: NEGATIVE

## 2015-03-17 LAB — RETICULOCYTES
RBC.: 2.66 MIL/uL — ABNORMAL LOW (ref 4.22–5.81)
RETIC COUNT ABSOLUTE: 204.8 10*3/uL — AB (ref 19.0–186.0)
RETIC CT PCT: 7.7 % — AB (ref 0.4–3.1)

## 2015-03-17 LAB — LACTATE DEHYDROGENASE: LDH: 194 U/L — ABNORMAL HIGH (ref 98–192)

## 2015-03-17 LAB — TROPONIN I: Troponin I: <0.03 ng/mL (ref <0.031)

## 2015-03-17 LAB — PROTIME-INR
INR: 1.03 (ref 0.00–1.49)
PROTHROMBIN TIME: 13.8 s (ref 11.6–15.2)

## 2015-03-17 LAB — DIRECT ANTIGLOBULIN TEST (NOT AT ARMC)
DAT, IgG: NEGATIVE
DAT, complement: NEGATIVE

## 2015-03-17 MED ORDER — SODIUM CHLORIDE 0.9% FLUSH
3.0000 mL | Freq: Two times a day (BID) | INTRAVENOUS | Status: DC
  Administered 2015-03-17 – 2015-03-18 (×2): 3 mL via INTRAVENOUS

## 2015-03-17 MED ORDER — INSULIN ASPART 100 UNIT/ML ~~LOC~~ SOLN: 0.0000 [IU] | Freq: Every day | SUBCUTANEOUS | Status: DC

## 2015-03-17 MED ORDER — CARBIDOPA-LEVODOPA 25-100 MG PO TABS
3.0000 | ORAL_TABLET | Freq: Four times a day (QID) | ORAL | Status: DC
  Administered 2015-03-17 – 2015-03-18 (×4): 3 via ORAL
  Filled 2015-03-17 (×5): qty 3

## 2015-03-17 MED ORDER — GABAPENTIN 400 MG PO CAPS
800.0000 mg | ORAL_CAPSULE | Freq: Three times a day (TID) | ORAL | Status: DC
  Administered 2015-03-17 – 2015-03-18 (×3): 800 mg via ORAL
  Filled 2015-03-17 (×3): qty 2

## 2015-03-17 MED ORDER — INSULIN ASPART 100 UNIT/ML ~~LOC~~ SOLN: 0.0000 [IU] | Freq: Three times a day (TID) | SUBCUTANEOUS | Status: DC

## 2015-03-17 MED ORDER — SODIUM CHLORIDE 0.9 % IV SOLN
10.0000 mL/h | Freq: Once | INTRAVENOUS | Status: AC
Start: 2015-03-17 — End: 2015-03-17
  Administered 2015-03-17: 10 mL/h via INTRAVENOUS

## 2015-03-17 MED ORDER — SODIUM CHLORIDE 0.9 % IV SOLN
80.0000 mg | Freq: Once | INTRAVENOUS | Status: AC
  Administered 2015-03-17: 80 mg via INTRAVENOUS
  Filled 2015-03-17: qty 80

## 2015-03-17 MED ORDER — GABAPENTIN 800 MG PO TABS: 800.0000 mg | ORAL_TABLET | Freq: Three times a day (TID) | ORAL | Status: DC

## 2015-03-17 MED ORDER — SODIUM CHLORIDE 0.9% FLUSH: 3.0000 mL | INTRAVENOUS | Status: DC | PRN

## 2015-03-17 NOTE — H&P (Signed)
Date: 03/17/2015               Patient Name:  [name] MRN: [id number]  DOB: [birth year] Age / Sex: 70 y.o., male   PCP: Buzzy Han, MD         Medical Service: Internal Medicine Teaching Service         Attending Physician: Dr. Thayer Headings, MD    First Contact: Dr. Jule Ser Pager: [phone]  Second Contact: Dr. Charlott Rakes Pager: [phone]       After Hours (After 5p/  First Contact Pager: [phone]  weekends / holidays): Second Contact Pager: [phone]   Chief Complaint: syncope  History of Present Illness: [name] is a 70 y.o. man with past medical history of chronic anemia, MGUS, aortic stenosis s/p aortic valve replacement with bioprosthetic valve (2006 and 2013), diabetes mellitus, hypertension, restless legs syndrome, GERD, HFpEF (EF 14-43%, grade 2 diastolic dysfunction in December 2016), and hyperlipidemia who presents to the Emergency Department due to a syncopal event.  Patient was at the [facility] this morning for multiple doctors appointments.  States he was sitting in the exam room when he began to feel lightheaded and "funny."  Patient reportedly then stood up and sat down again.  At this point, his wife reports he lost consciousness, was not responding, and was breathing very heavily.  Patient has no recall of this.  He was apparently unconscious for approximately 3-4 minutes and then took approximately 10-15 minutes until he was responding more appropriately to questions.  He still felt weak and his blood pressure was noted to be in the 15Q systolic with HR of 00Q.  This improved to 676P systolic with 1 liter of IV fluids.  Received another unit of fluids while being transported to ED.  Patient reports similar history of syncopal episode in 2013 that was attributed to his aortic stenosis.  States he felt well this morning before event and walked into the [place] without issue.  On review of systems, patient denies any chest pain, orthopnea,  PND, shortness of breath, loss of bowel/bladder control, nausea, vomiting, melena, abdominal pain.  He does report feeling flushed and hot before syncopizing, lightheadedness, decreased energy x 2 months, intermittent blood on toilet paper with wiping but denies hematochezia.  Other 10-point review of systems negative.  Meds: Current Facility-Administered Medications  Medication Dose Route Frequency Provider Last Rate Last Dose  . insulin aspart (novoLOG) injection 0-9 Units  0-9 Units Subcutaneous TID WC Francesca Oman, DO      . sodium chloride flush (NS) 0.9 % injection 3 mL  3 mL Intravenous Q12H Francesca Oman, DO      . sodium chloride flush (NS) 0.9 % injection 3 mL  3 mL Intravenous PRN Francesca Oman, DO       Current Outpatient Prescriptions  Medication Sig Dispense Refill  . amLODipine (NORVASC) 5 MG tablet Take 1 tablet (5 mg total) by mouth daily. 30 tablet 11  . amLODipine (NORVASC) 5 MG tablet Take 5 mg by mouth daily.    Marland Kitchen aspirin 81 MG tablet Take 81 mg by mouth daily.    . carbidopa-levodopa (SINEMET IR) 25-100 MG tablet Take 3 tablets by mouth 4 (four) times daily.     . Cholecalciferol (VITAMIN D) 2000 UNITS CAPS Take 1 capsule by mouth daily.    . cyanocobalamin 500 MCG tablet Take 500 mcg by mouth 2 (two) times daily.    Marland Kitchen  docusate sodium (COLACE) 100 MG capsule Take 100 mg by mouth 2 (two) times daily.    . ferrous sulfate 325 (65 FE) MG tablet Take 325 mg by mouth at bedtime.    . finasteride (PROSCAR) 5 MG tablet Take 5 mg by mouth daily.  11  . gabapentin (NEURONTIN) 800 MG tablet Take 800 mg by mouth 4 (four) times daily.     Marland Kitchen gabapentin (NEURONTIN) 800 MG tablet Take 800 mg by mouth 3 (three) times daily.    . hydrochlorothiazide (MICROZIDE) 12.5 MG capsule TAKE 1 CAPSULE (12.5 MG TOTAL) BY MOUTH DAILY. 30 capsule 10  . losartan (COZAAR) 100 MG tablet Take 100 mg by mouth at bedtime.    . metFORMIN (GLUCOPHAGE) 500 MG tablet Take 500 mg by mouth 2 (two) times daily  with a meal.    . methylcellulose (ARTIFICIAL TEARS) 1 % ophthalmic solution Place 1 drop into both eyes at bedtime as needed (dry eyes).    . metoprolol (LOPRESSOR) 50 MG tablet Take 50 mg by mouth every morning.    . metoprolol tartrate (LOPRESSOR) 50 MG tablet Take 1 tablet (50 mg total) by mouth 2 (two) times daily. 180 tablet 4  . Omega-3 Fatty Acids (FISH OIL) 1000 MG CAPS Take 1,000 mg by mouth daily.    . pravastatin (PRAVACHOL) 40 MG tablet Take 1 tablet (40 mg total) by mouth every evening. 30 tablet 11  . pravastatin (PRAVACHOL) 40 MG tablet Take 40 mg by mouth every evening.    . zolpidem (AMBIEN) 10 MG tablet Take 10 mg by mouth at bedtime as needed for sleep.     . carbidopa-levodopa (SINEMET) 25-100 MG per tablet Take 2 tablets by mouth daily with breakfast.     . carboxymethylcellulose 1 % ophthalmic solution Apply 1 drop to eye 3 (three) times daily as needed (dry eyes).    . cholecalciferol (VITAMIN D) 1000 UNITS tablet Take 2,000 Units by mouth daily.    . cyclobenzaprine (FLEXERIL) 10 MG tablet Take 1 tablet by mouth as needed for muscle spasms.   0  . docusate sodium (COLACE) 100 MG capsule Take 200 mg by mouth daily. For constipation    . famotidine (PEPCID) 20 MG tablet Take 1 tablet (20 mg total) by mouth 2 (two) times daily. (Patient not taking: Reported on 03/17/2015) 10 tablet 0  . finasteride (PROSCAR) 5 MG tablet Take 5 mg by mouth daily.    . fish oil-omega-3 fatty acids 1000 MG capsule Take 1 g by mouth 2 (two) times daily.     . Hypromellose 0.3 % GEL Apply 1 drop to eye daily as needed (dry eyes).    Marland Kitchen losartan (COZAAR) 100 MG tablet Take 100 mg by mouth daily.    . metFORMIN (GLUCOPHAGE) 500 MG tablet Take 500 mg by mouth 2 (two) times daily with a meal.    . pantoprazole (PROTONIX) 40 MG tablet Take 40 mg by mouth daily as needed. For GERD    . pentosan polysulfate (ELMIRON) 100 MG capsule Take 100 mg by mouth 3 (three) times daily.    Vladimir Faster Glycol-Propyl  Glycol (SYSTANE ULTRA OP) Apply 1 drop to eye daily as needed (dry eyes).    . predniSONE (STERAPRED UNI-PAK 21 TAB) 10 MG (21) TBPK tablet Take 1 tablet (10 mg total) by mouth daily. Take as presribed (Patient not taking: Reported on 03/17/2015) 21 tablet 0  . zolpidem (AMBIEN) 10 MG tablet Take 5 mg by mouth at bedtime  as needed for sleep.      Allergies: Allergies as of 03/17/2015 - Review Complete 03/17/2015  Allergen Reaction Noted  . Spironolactone Other (See Comments) 01/31/2015  . Avodart [dutasteride] Other (See Comments)   . Avodart [dutasteride] Other (See Comments) 01/31/2015  . Codeine Other (See Comments)   . Diazepam  12/27/2012  . Diazepam Other (See Comments) 01/31/2015  . Doxazosin  12/27/2012  . Doxazosin Other (See Comments) 01/31/2015  . Feraheme [ferumoxytol] Swelling 11/15/2011  . Losartan  03/09/2013  . Spironolactone  12/27/2012   Past Medical History  Diagnosis Date  . HTN (hypertension)   . GERD (gastroesophageal reflux disease)     bravo pH study 2008  . Hyperlipidemia   . Hemorrhoids     external and internal  . Insomnia   . Allergic rhinitis   . OA (osteoarthritis)   . Hiatal hernia   . BPH (benign prostatic hypertrophy)   . Chronic cough   . Anemia 2009  . Diverticulosis of colon     on colonoscopy 2008  . OSA (obstructive sleep apnea)     USES CPAP AS NEEDED  . Headache(784.0)   . IBS (irritable bowel syndrome)   . Periodontitis     chronic with bone loss  . Gout   . Asthma   . Depression     pt denies  . Cataract   . Chronic interstitial cystitis   . Aortic stenosis     a. s/p porcine AVR 2006;  b. s/p redo tissue AVR 12/2011 (Dr. Roxy Manns) - preAVR LHC with no CAD  . H/O aortic valve replacement with porcine valve     2006, 2013  . Diabetes mellitus     type 2  . S/P aortic valve replacement with bioprosthetic valve 12/06/2011    Redo AVR using 23 mm [facility] Ease pericardial tissue valve  . Hx of echocardiogram     a. Echo   (post AVR) 12/2011:  mod LVH, EF 60-65%, Gr 2 diast dysfn, mild AI, AVR ok (mean gradient 19 mmHg), MAC, mild BAE  . Diabetes mellitus without complication ([place])   . Hypertension   . Hyperthyroidism   . Restless leg syndrome    Past Surgical History  Procedure Laterality Date  . Aortic valve replacement  10/13/2004    75m [place] pericardial tissue valve  . Nasal septoplasty w/ turbinoplasty    . Right knee arthroscopy    . Bunionectomy      right  . Cataract extraction  2009    &   2012    BILATERAL  . Esophagogastroduodenoscopy  08/30/2011    Procedure: ESOPHAGOGASTRODUODENOSCOPY (EGD);  Surgeon: RInda Castle MD;  [facility]  Service: Endoscopy;  Laterality: N/A;  Rm 3005   . Colonoscopy  08/31/2011    Procedure: COLONOSCOPY;  Surgeon: RInda Castle MD;  [facility]  Service: Endoscopy;  Laterality: N/A;  . Refractive surgery      bilateral  . Aortic valve replacement  12/06/2011    Procedure: REDO AORTIC VALVE REPLACEMENT (AVR);  Surgeon: CRexene Alberts MD;  [facility]  Service: Open Heart Surgery;  Laterality: N/A;  . Left heart catheterization with coronary angiogram N/A 11/30/2011    Procedure: LEFT HEART CATHETERIZATION WITH CORONARY ANGIOGRAM;  Surgeon: CBurnell Blanks MD;  [facility];  Service: Cardiovascular;  Laterality: N/A;  . Right heart catheterization Bilateral 11/30/2011    Procedure: RIGHT HEART CATH;  Surgeon: CAnnita Brod  Angelena Form, MD;  Location: [place] CATH LAB;  Service: Cardiovascular;  Laterality: Bilateral;  . Aorta - femoral artery bypass graft    . Hernia repair    . Joint replacement      knee  . Cardiac surgery      aorta vavle replacement   Family History  Problem Relation Age of Onset  . Heart disease Father   . Osteoarthritis Mother   . Hypertension Sister   . Hyperlipidemia     Social History   Social History  . Marital Status: Married    Spouse Name: N/A  . Number of  Children: 0  . Years of Education: N/A   Occupational History  . retired    Social History Main Topics  . Smoking status: Former Smoker    Quit date: 09/30/1971  . Smokeless tobacco: Not on file  . Alcohol Use: No  . Drug Use: No  . Sexual Activity: Not Currently   Other Topics Concern  . Not on file   Social History Narrative   ** Merged History Encounter **        Review of Systems: Pertinent items are noted in HPI.  Physical Exam: Blood pressure 133/68, pulse 58, temperature 97.8 F (36.6 C), temperature source Oral, resp. rate 19, SpO2 100 %. Physical Exam  Constitutional: He is oriented to person, place, and time.  Well developed, well nourished, non-distressed, tired appearing male sitting up in bed.  Eyes: Conjunctivae and EOM are normal.  Neck: Normal range of motion. No JVD present.  No carotid bruit.  Cardiovascular: Normal rate and regular rhythm.   3/6 systolic murmur. Trace pedal edema.   Pulmonary/Chest: Effort normal and breath sounds normal. No respiratory distress. He has no wheezes.  Abdominal: Soft. Bowel sounds are normal. He exhibits no distension. There is no tenderness.  Neurological: He is alert and oriented to person, place, and time. No cranial nerve deficit.  Skin: Skin is warm and dry.  Psychiatric: Affect normal.    Lab results: Basic Metabolic Panel:  Recent Labs  03/17/15 1058  NA 133*  K 4.0  CL 102  CO2 25  GLUCOSE 175*  BUN 11  CREATININE 0.69  CALCIUM 9.8   Liver Function Tests: No results for input(s): AST, ALT, ALKPHOS, BILITOT, PROT, ALBUMIN in the last 72 hours. No results for input(s): LIPASE, AMYLASE in the last 72 hours. No results for input(s): AMMONIA in the last 72 hours. CBC:  Recent Labs  03/17/15 1058  WBC 5.2  NEUTROABS 3.7  HGB 7.3*  HCT 22.7*  MCV 100.9*  PLT 146*   Cardiac Enzymes:  Recent Labs  03/17/15 1058  TROPONINI <0.03   BNP: No results for input(s): PROBNP in the last 72  hours. D-Dimer: No results for input(s): DDIMER in the last 72 hours. CBG: No results for input(s): GLUCAP in the last 72 hours. Hemoglobin A1C: No results for input(s): HGBA1C in the last 72 hours. Fasting Lipid Panel: No results for input(s): CHOL, HDL, LDLCALC, TRIG, CHOLHDL, LDLDIRECT in the last 72 hours. Thyroid Function Tests: No results for input(s): TSH, T4TOTAL, FREET4, T3FREE, THYROIDAB in the last 72 hours. Anemia Panel: No results for input(s): VITAMINB12, FOLATE, FERRITIN, TIBC, IRON, RETICCTPCT in the last 72 hours. Coagulation: No results for input(s): LABPROT, INR in the last 72 hours. Urine Drug Screen: Drugs of Abuse  No results found for: LABOPIA, COCAINSCRNUR, LABBENZ, AMPHETMU, THCU, LABBARB  Alcohol Level: No results for input(s): ETH in the last 72 hours.  Urinalysis: No results for input(s): COLORURINE, LABSPEC, PHURINE, GLUCOSEU, HGBUR, BILIRUBINUR, KETONESUR, PROTEINUR, UROBILINOGEN, NITRITE, LEUKOCYTESUR in the last 72 hours.  Invalid input(s): APPERANCEUR   Imaging results:  No results found.  Other results: EKG: Sinus rhythm, Borderline left axis deviation, Non-specific ST-t changes  Assessment & Plan by Problem: Active Problems:   Acute blood loss anemia  70 y.o. man with past medical history of chronic anemia, aortic stenosis s/p aortic valve replacement with bioprosthetic valve (2006 and 2013), diabetes mellitus, hypertension, restless legs syndrome, GERD, HFpEF (EF 68-12%, grade 2 diastolic dysfunction in December 2016), and hyperlipidemia who presents to the Emergency Department due to a syncopal event.  Symptomatic Acute on Chronic Anemia: patient with syncopal event at Oncologist appointment while at [facility].  Prior history of syncope with aortic stenosis in 2013.  Hypotensive initially that improved with IV fluids.  BP was 751Z systolic on our examination.  Patient with recent labs from [place] in early January 2017 with hemoglobin of  10.5.  CBC today in the Emergency Department with hemoglobin of 7.3, HCT 22.7, MCV 100.9, platelets 146, WBC 5.2.  EKG with no ischemic changes, troponin negative.  He is on daily ferrous sulfate and receives vitamin B12 injections.  He has history of MGUS and oncologist is concerned about possible progression to multiple myeloma and had planned for bone marrow biopsy.  His BMET was normal except for sodium 133 and glucose 175.  Creatine and calcium normal.  Receiving 1 unit of PRBCs in the ED and dose of Protonix for possible GI bleed.  Differential includes GI bleed (symptoms are not convincing for this and FOBT in the ED was negative, no reported NSAIDs, no history of varices), dilutional from 2+ liters of IV fluids but still a significant drop, progression of his MGUS to multiple myeloma, hemolytic anemia, severe B12 deficiency. - we were able to speak with Dr. Rico Sheehan (Oncology at [facility]) that he was supposed to see today at the [facility] for bone marrow biopsy. She reports that in 2015, he had a calcium of 10.9 and a positive SPEP with an M spike that was never followed up. On 03/11/15, he presented to the allergist office who caught elevated calcium 10.6 on his initial lab work and ordered an SPEP which was notable for an M spike 2.5 with predominantly IgG kappa. Other levels were IgA 50 [low], IgM 20 [low], IgG 3633 [high].  - get skeletal survey - consider bone marrow biopsy here - post-transfusion CBC - check hemolysis labs >> haptoglobin, LDH, reticulocytes count, LFTs, Coombs test, save smear - check PT/INR - BMP in the morning - home meds for anemia are ferrous sulfate 340m QHS  Syncope: patient reported feeling lightheaded and then losing consciousness for a few minutes per patients wife.  [place] reported feeling fine this [place]  He did not eat anything this morning but do not suspect hypoglycemia as not on insulin.  Do not suspect cardiac source  as patient had symptoms prior to event, EKG without ischemia, troponin negative.  Do not suspect seizure given no prior history and no other symptoms such as bladder or bowel incontinence, no tongue biting.  Patient syncopized after discussion of bone marrow biopsy due to concern for multiple myeloma.  Sister recently passed away from cancer.  Likely vasovagal from this as well as due to orthostatic hypotension from possible dehydration, possible acute anemia, blood pressure meds.   Orthostatics not obtained at admission and  patient has received 2 liters of IV fluids. - anemia work-up as above  History of Aortic Valve Replacement: in 2006 and 2013 with porcine valve.  Followed by Dr. Stanford Breed.  Last seen in December 2016.  Hypertension: home meds are amlodipine 79m daily, HCTZ 12.534mdaily, losartan 10023mHS, metoprolol 71m53mD - hold home meds for now  Restless Legs Syndrome: home meds are gabapentin 800mg98m and carbidopa-levodopa 25-100mg 4mblets daily - continue home meds  Hyperlipidemia: history of LHC with normal coronary arteries.  Home meds are fish oils, Pravastatin 40mg d41m, Aspirin 81mg da86m- hold home meds for now  Diabetes mellitus: home meds are metformin 500mg BID21mast A1C in our system was 4.3 in October 2013 - hold home meds for now - sliding scale insulin-sensitive  FEN: Fluids: s/p 2 liters of NS IV fluids Electrolytes: replace as needed Nutrition: carb modified  DVT PPx: SCDs  CODE: FULL   Dr. Agnew wouJiles Crockerke discharge summary faxed to: [phone] is deferred at this time, awaiting improvement of current medical problems. Anticipated discharge in approximately 2-3 day(s).   The patient does have a current PCP ([facility] OkBuzzy [facility].  The patient does not have transportation limitations that hinder transportation to clinic appointments.  Signed: Alveta Quintela  WJule [email or web address], 2:58 PM

## 2015-03-17 NOTE — ED Notes (Signed)
Pt. BIB [facility] as tx from [place]. Pt. Was at [facility] for appointment when he had a full syncopal episode. Staff states he became dizzy and diaphoretic, followed by approx 45 min of unresponsiveness. Pt. Given 2L NS PTA. BP initially 70/30, now 124/60.

## 2015-03-17 NOTE — ED Provider Notes (Signed)
3:30 PM Received call from Dr Elizabeth Agnew, Oncology, from [facility]. Has MGUS and she has some concerns that may be progressing to multiple myeloma.  Relayed concerns to admitting team.      , MD 03/17/15 1539 

## 2015-03-17 NOTE — ED Notes (Signed)
Blood bank called to notify us that the blood for the pt is ready.

## 2015-03-17 NOTE — ED Notes (Signed)
Gave pt water, per Dr. Wilson Singer. Informed Martie Round - RN.

## 2015-03-17 NOTE — ED Provider Notes (Signed)
CSN: [id number]     Arrival date & time 03/17/15  1035 History   First MD Initiated Contact with Patient 03/17/15 1036     Chief Complaint  Patient presents with  . Loss of Consciousness     (Consider location/radiation/quality/duration/timing/severity/associated sxs/prior Treatment) HPI  69yM with syncope. Transferred from [facility] per patient request. Was seated in office for appointment when began to feel lightheaded. SE reports he appeared very pale and subsequently lost consciousness for a couple of minutes. Quick return to baseline but still felt weak and noted to be hypotensive and bradycardic in 40s. Initial BP 70/30 which improved to [id number] systolic after 1L of IVF. He received 1 additional L of NS during transport. Reports that he was in his usual state of health just prior to this. On arrival with ED here he appeared pale but was awake and alert. Reports feeling better. Denies any acute pain, lightheadedness or dyspnea. Has has intermittent bright red rectal bleeding which he attributes to hemorrhoids. No melena. Takes 81 mg aspirin daily.   -Hx of severe AS s/p bovine AVR.  -Hx syncope in the setting of upper GI bleeding in July of 2013 without an identifiable source found. -Admitted in October 2013 again with syncope. Echo with moderate LVH, EF 60 to 123456, grade 1 diastolicy dysfunction. -LHC in October of 2013 showing normal coronaries. -ECHO 02/04/15: no regional wall motionabnormalities. EF 60-65%. grade 2 diastolic dysfunction. AV sewing ring had no rocking motion. There was moderate intravalvular regurgitation.  Past Medical History  Diagnosis Date  . HTN (hypertension)   . GERD (gastroesophageal reflux disease)     bravo pH study 2008  . Hyperlipidemia   . Hemorrhoids     external and internal  . Insomnia   . Allergic rhinitis   . OA (osteoarthritis)   . Hiatal hernia   . BPH (benign prostatic hypertrophy)   . Chronic cough   . Anemia 2009  . Diverticulosis of  colon     on colonoscopy 2008  . OSA (obstructive sleep apnea)     USES CPAP AS NEEDED  . Headache(784.0)   . IBS (irritable bowel syndrome)   . Periodontitis     chronic with bone loss  . Gout   . Asthma   . Depression     pt denies  . Cataract   . Chronic interstitial cystitis   . Aortic stenosis     a. s/p porcine AVR 2006;  b. s/p redo tissue AVR 12/2011 (Dr. Roxy Manns) - preAVR LHC with no CAD  . H/O aortic valve replacement with porcine valve     2006, 2013  . Diabetes mellitus     type 2  . S/P aortic valve replacement with bioprosthetic valve 12/06/2011    Redo AVR using 23 mm [facility] Ease pericardial tissue valve  . Hx of echocardiogram     a. Echo  (post AVR) 12/2011:  mod LVH, EF 60-65%, Gr 2 diast dysfn, mild AI, AVR ok (mean gradient 19 mmHg), MAC, mild BAE  . Diabetes mellitus without complication ([place])   . Hypertension   . Hyperthyroidism   . Restless leg syndrome    Past Surgical History  Procedure Laterality Date  . Aortic valve replacement  10/13/2004    71mm [place] pericardial tissue valve  . Nasal septoplasty w/ turbinoplasty    . Right knee arthroscopy    . Bunionectomy      right  . Cataract extraction  2009    &  2012    BILATERAL  . Esophagogastroduodenoscopy  08/30/2011    Procedure: ESOPHAGOGASTRODUODENOSCOPY (EGD);  Surgeon: Inda Castle, MD;  Location: [place];  Service: Endoscopy;  Laterality: N/A;  Rm 3005   . Colonoscopy  08/31/2011    Procedure: COLONOSCOPY;  Surgeon: Inda Castle, MD;  Location: [place];  Service: Endoscopy;  Laterality: N/A;  . Refractive surgery      bilateral  . Aortic valve replacement  12/06/2011    Procedure: REDO AORTIC VALVE REPLACEMENT (AVR);  Surgeon: Rexene Alberts, MD;  Location: [place];  Service: Open Heart Surgery;  Laterality: N/A;  . Left heart catheterization with coronary angiogram N/A 11/30/2011    Procedure: LEFT HEART CATHETERIZATION WITH CORONARY ANGIOGRAM;  Surgeon:  Burnell Blanks, MD;  Location[facility] C[facility] LAB;  Service: Cardiovascular;  Laterality: N/A;  . Right heart catheterization Bilateral 11/30/2011    Procedure: RIGHT HEART CATH;  Surgeon: Burnell Blanks, MD;  Location: [facility] CATH LAB;  Service: Cardiovascular;  Laterality: Bilateral;  . Aorta - femoral artery bypass graft    . Hernia repair    . Joint replacement      knee  . Cardiac surgery      aorta vavle replacement   Family History  Problem Relation Age of Onset  . Heart disease Father   . Osteoarthritis Mother   . Hypertension Sister   . Hyperlipidemia     Social History  Substance Use Topics  . Smoking status: Former Smoker    Quit date: 09/30/1971  . Smokeless tobacco: None  . Alcohol Use: No    Review of Systems  All systems reviewed and negative, other than as noted in HPI.   Allergies  Spironolactone; Avodart; Avodart; Codeine; Diazepam; Diazepam; Doxazosin; Doxazosin; Feraheme; Losartan; and Spironolactone  Home Medications   Prior to Admission medications   Medication Sig Start Date End Date Taking? Authorizing Provider  amLODipine (NORVASC) 5 MG tablet Take 1 tablet (5 mg total) by mouth daily. 01/19/15   Lelon Perla, MD  amLODipine (NORVASC) 5 MG tablet Take 5 mg by mouth daily.    Historical Provider, MD  aspirin 81 MG tablet Take 81 mg by mouth daily.    Historical Provider, MD  carbidopa-levodopa (SINEMET IR) 25-100 MG tablet Take 1 tablet by mouth 4 (four) times daily.    Historical Provider, MD  carbidopa-levodopa (SINEMET) 25-100 MG per tablet Take 2 tablets by mouth daily with breakfast.     Historical Provider, MD  carboxymethylcellulose 1 % ophthalmic solution Apply 1 drop to eye 3 (three) times daily as needed (dry eyes).    Historical Provider, MD  cholecalciferol (VITAMIN D) 1000 UNITS tablet Take 2,000 Units by mouth daily.    Historical Provider, MD  Cholecalciferol (VITAMIN D) 2000 UNITS CAPS Take 1 capsule by mouth daily.     Historical Provider, MD  cyanocobalamin 500 MCG tablet Take 500 mcg by mouth 2 (two) times daily.    Historical Provider, MD  cyclobenzaprine (FLEXERIL) 10 MG tablet Take 1 tablet by mouth as needed for muscle spasms.  11/18/13   Historical Provider, MD  docusate sodium (COLACE) 100 MG capsule Take 200 mg by mouth daily. For constipation    Historical Provider, MD  docusate sodium (COLACE) 100 MG capsule Take 100 mg by mouth 2 (two) times daily.    Historical Provider, MD  famotidine (PEPCID) 20 MG tablet Take 1 tablet (20 mg total) by mouth 2 (two) times daily. 01/31/15  Kristen N Ward, DO  ferrous sulfate 325 (65 FE) MG tablet Take 325 mg by mouth at bedtime.    Historical Provider, MD  finasteride (PROSCAR) 5 MG tablet Take 5 mg by mouth daily. 12/03/13   Historical Provider, MD  finasteride (PROSCAR) 5 MG tablet Take 5 mg by mouth daily.    Historical Provider, MD  fish oil-omega-3 fatty acids 1000 MG capsule Take 1 g by mouth 2 (two) times daily.     Historical Provider, MD  gabapentin (NEURONTIN) 800 MG tablet Take 800 mg by mouth 4 (four) times daily.     Historical Provider, MD  gabapentin (NEURONTIN) 800 MG tablet Take 800 mg by mouth 3 (three) times daily.    Historical Provider, MD  hydrochlorothiazide (MICROZIDE) 12.5 MG capsule TAKE 1 CAPSULE (12.5 MG TOTAL) BY MOUTH DAILY. 03/10/15   Lelon Perla, MD  Hypromellose 0.3 % GEL Apply 1 drop to eye daily as needed (dry eyes).    Historical Provider, MD  losartan (COZAAR) 100 MG tablet Take 100 mg by mouth at bedtime. 01/20/12   Lelon Perla, MD  losartan (COZAAR) 100 MG tablet Take 100 mg by mouth daily.    Historical Provider, MD  metFORMIN (GLUCOPHAGE) 500 MG tablet Take 500 mg by mouth 2 (two) times daily with a meal.    Historical Provider, MD  metFORMIN (GLUCOPHAGE) 500 MG tablet Take 500 mg by mouth 2 (two) times daily with a meal.    Historical Provider, MD  methylcellulose (ARTIFICIAL TEARS) 1 % ophthalmic solution Place 1  drop into both eyes at bedtime as needed (dry eyes).    Historical Provider, MD  metoprolol (LOPRESSOR) 50 MG tablet Take 50 mg by mouth every morning.    Historical Provider, MD  metoprolol tartrate (LOPRESSOR) 50 MG tablet Take 1 tablet (50 mg total) by mouth 2 (two) times daily. 02/24/12   Lelon Perla, MD  Omega-3 Fatty Acids (FISH OIL) 1000 MG CAPS Take 1,000 mg by mouth daily.    Historical Provider, MD  pantoprazole (PROTONIX) 40 MG tablet Take 40 mg by mouth daily as needed. For GERD    Historical Provider, MD  pentosan polysulfate (ELMIRON) 100 MG capsule Take 100 mg by mouth 3 (three) times daily.    Historical Provider, MD  Polyethyl Glycol-Propyl Glycol (SYSTANE ULTRA OP) Apply 1 drop to eye daily as needed (dry eyes).    Historical Provider, MD  pravastatin (PRAVACHOL) 40 MG tablet Take 1 tablet (40 mg total) by mouth every evening. 01/19/15   Lelon Perla, MD  pravastatin (PRAVACHOL) 40 MG tablet Take 40 mg by mouth every evening.    Historical Provider, MD  predniSONE (STERAPRED UNI-PAK 21 TAB) 10 MG (21) TBPK tablet Take 1 tablet (10 mg total) by mouth daily. Take as presribed 01/31/15   Kristen N Ward, DO  zolpidem (AMBIEN) 10 MG tablet Take 5 mg by mouth at bedtime as needed for sleep.     Historical Provider, MD  zolpidem (AMBIEN) 10 MG tablet Take 5 mg by mouth at bedtime as needed for sleep.    Historical Provider, MD   BP 114/76 mmHg  Pulse 60  Temp(Src) 98 F (36.7 C) (Oral)  Resp 18  SpO2 96% Physical Exam  Constitutional: He appears well-developed and well-nourished. No distress.  Laying in bed. Appears pale. NAD.  HENT:  Head: Normocephalic and atraumatic.  Eyes: Conjunctivae are normal. Right eye exhibits no discharge. Left eye exhibits no discharge.  Neck: Neck  supple.  Cardiovascular: Normal rate, regular rhythm and normal heart sounds.  Exam reveals no gallop and no friction rub.   No murmur heard. Pulmonary/Chest: Effort normal and breath sounds  normal. No respiratory distress.  Abdominal: Soft. He exhibits no distension. There is no tenderness.  Genitourinary:  Non bleeding/nonthrombosed external hemorrhoid. No gross blood. Stool hemoccult negative.   Musculoskeletal: He exhibits no edema or tenderness.  Neurological: He is alert.  Skin: Skin is warm and dry.  Psychiatric: He has a normal mood and affect. His behavior is normal. Thought content normal.  Nursing note and vitals reviewed.   ED Course  Procedures (including critical care time)  CRITICAL CARE Performed by: Virgel Manifold Total critical care time: 35  minutes Critical care time was exclusive of separately billable procedures and treating other patients. Critical care was necessary to treat or prevent imminent or life-threatening deterioration. Critical care was time spent personally by me on the following activities: development of treatment plan with patient and/or surrogate as well as nursing, discussions with consultants, evaluation of patient's response to treatment, examination of patient, obtaining history from patient or surrogate, ordering and performing treatments and interventions, ordering and review of laboratory studies, ordering and review of radiographic studies, pulse oximetry and re-evaluation of patient's condition.  Labs Review Labs Reviewed  CBC WITH DIFFERENTIAL/PLATELET - Abnormal; Notable for the following:    RBC 2.25 (*)    Hemoglobin 7.3 (*)    HCT 22.7 (*)    MCV 100.9 (*)    RDW 15.6 (*)    Platelets 146 (*)    All other components within normal limits  BASIC METABOLIC PANEL - Abnormal; Notable for the following:    Sodium 133 (*)    Glucose, Bld 175 (*)    All other components within normal limits  TROPONIN I  OCCULT BLOOD X 1 CARD TO LAB, STOOL  POC OCCULT BLOOD, ED  PREPARE RBC (CROSSMATCH)  TYPE AND SCREEN    Imaging Review No results found. I have personally reviewed and evaluated these images and lab results as part  of my medical decision-making.   EKG Interpretation   Date/Time:  Tuesday March 17 2015 10:56:28 EST Ventricular Rate:  57 PR Interval:  196 QRS Duration: 107 QT Interval:  466 QTC Calculation: [id number] R Axis:   -20 Text Interpretation:  Sinus rhythm Non-specific intra-ventricular  conduction delay Borderline left axis deviation Non-specific ST-t changes  Confirmed by Wilson Singer  MD, Gabriell Casimir ([id number]) on 03/17/2015 12:20:18 PM      MDM   Final diagnoses:  Syncope and collapse  Anemia, unspecified anemia type    69yM with syncope. Anemic. Some may be dilutional with 2L of IVF at this point, but is still significant drop. Has blood work results with him from [place] last month. Hemoglobin was 10.5 then. Denies melena. Some intermittent BRB per report. Non bleeding hemorrhoids on exam. Stool heme negative. Will give dose of protonix for possible UGIB although not convincing based on symptoms. Will transfuse 1u PRBC. Admit.    Virgel Manifold, MD 03/17/15 1246

## 2015-03-17 NOTE — ED Notes (Signed)
Ordered pt a card modified dinner tray.

## 2015-03-18 ENCOUNTER — Encounter (HOSPITAL_COMMUNITY): Payer: Self-pay | Admitting: General Practice

## 2015-03-18 DIAGNOSIS — Z954 Presence of other heart-valve replacement: Secondary | ICD-10-CM

## 2015-03-18 DIAGNOSIS — G2581 Restless legs syndrome: Secondary | ICD-10-CM

## 2015-03-18 DIAGNOSIS — I5032 Chronic diastolic (congestive) heart failure: Secondary | ICD-10-CM | POA: Diagnosis not present

## 2015-03-18 DIAGNOSIS — R55 Syncope and collapse: Secondary | ICD-10-CM | POA: Insufficient documentation

## 2015-03-18 DIAGNOSIS — K922 Gastrointestinal hemorrhage, unspecified: Secondary | ICD-10-CM | POA: Diagnosis not present

## 2015-03-18 DIAGNOSIS — E785 Hyperlipidemia, unspecified: Secondary | ICD-10-CM

## 2015-03-18 DIAGNOSIS — I10 Essential (primary) hypertension: Secondary | ICD-10-CM

## 2015-03-18 DIAGNOSIS — D62 Acute posthemorrhagic anemia: Secondary | ICD-10-CM | POA: Diagnosis not present

## 2015-03-18 DIAGNOSIS — D649 Anemia, unspecified: Secondary | ICD-10-CM

## 2015-03-18 DIAGNOSIS — Z7984 Long term (current) use of oral hypoglycemic drugs: Secondary | ICD-10-CM

## 2015-03-18 DIAGNOSIS — D589 Hereditary hemolytic anemia, unspecified: Secondary | ICD-10-CM | POA: Diagnosis not present

## 2015-03-18 DIAGNOSIS — C9000 Multiple myeloma not having achieved remission: Secondary | ICD-10-CM

## 2015-03-18 DIAGNOSIS — E119 Type 2 diabetes mellitus without complications: Secondary | ICD-10-CM

## 2015-03-18 LAB — TYPE AND SCREEN
ABO/RH(D): A NEG
Antibody Screen: NEGATIVE
Unit division: 0

## 2015-03-18 LAB — CBC WITH DIFFERENTIAL/PLATELET
BASOS PCT: 0 %
Basophils Absolute: 0 10*3/uL (ref 0.0–0.1)
Eosinophils Absolute: 0.1 10*3/uL (ref 0.0–0.7)
Eosinophils Relative: 1 %
HEMATOCRIT: 24.8 % — AB (ref 39.0–52.0)
HEMOGLOBIN: 8.2 g/dL — AB (ref 13.0–17.0)
LYMPHS PCT: 31 %
Lymphs Abs: 1.3 10*3/uL (ref 0.7–4.0)
MCH: 32.4 pg (ref 26.0–34.0)
MCHC: 33.1 g/dL (ref 30.0–36.0)
MCV: 98 fL (ref 78.0–100.0)
MONOS PCT: 10 %
Monocytes Absolute: 0.4 10*3/uL (ref 0.1–1.0)
NEUTROS ABS: 2.5 10*3/uL (ref 1.7–7.7)
NEUTROS PCT: 58 %
Platelets: 160 10*3/uL (ref 150–400)
RBC: 2.53 MIL/uL — ABNORMAL LOW (ref 4.22–5.81)
RDW: 17 % — ABNORMAL HIGH (ref 11.5–15.5)
WBC: 4.4 10*3/uL (ref 4.0–10.5)

## 2015-03-18 LAB — COMPREHENSIVE METABOLIC PANEL
ALBUMIN: 3.1 g/dL — AB (ref 3.5–5.0)
ALK PHOS: 61 U/L (ref 38–126)
ALT: 6 U/L — ABNORMAL LOW (ref 17–63)
ANION GAP: 8 (ref 5–15)
AST: 35 U/L (ref 15–41)
BUN: 7 mg/dL (ref 6–20)
CALCIUM: 10.2 mg/dL (ref 8.9–10.3)
CHLORIDE: 100 mmol/L — AB (ref 101–111)
CO2: 28 mmol/L (ref 22–32)
Creatinine, Ser: 0.65 mg/dL (ref 0.61–1.24)
GFR calc non Af Amer: >60 mL/min (ref >60)
GLUCOSE: 121 mg/dL — AB (ref 65–99)
POTASSIUM: 3.8 mmol/L (ref 3.5–5.1)
SODIUM: 136 mmol/L (ref 135–145)
Total Bilirubin: 0.8 mg/dL (ref 0.3–1.2)
Total Protein: 7.5 g/dL (ref 6.5–8.1)

## 2015-03-18 LAB — KAPPA/LAMBDA LIGHT CHAINS
Kappa free light chain: 129.27 mg/L — ABNORMAL HIGH (ref 3.30–19.40)
Kappa, lambda light chain ratio: 39.05 — ABNORMAL HIGH (ref 0.26–1.65)
Lambda free light chains: 3.31 mg/L — ABNORMAL LOW (ref 5.71–26.30)

## 2015-03-18 LAB — GLUCOSE, CAPILLARY
GLUCOSE-CAPILLARY: 159 mg/dL — AB (ref 65–99)
GLUCOSE-CAPILLARY: 159 mg/dL — AB (ref 65–99)

## 2015-03-18 LAB — HAPTOGLOBIN

## 2015-03-18 LAB — BETA 2 MICROGLOBULIN, SERUM: Beta-2 Microglobulin: 2.4 mg/L (ref 0.6–2.4)

## 2015-03-18 MED ORDER — ACETAMINOPHEN 325 MG PO TABS
650.0000 mg | ORAL_TABLET | Freq: Four times a day (QID) | ORAL | Status: DC | PRN
  Administered 2015-03-18: 650 mg via ORAL
  Filled 2015-03-18: qty 2

## 2015-03-18 MED ORDER — METOPROLOL TARTRATE 50 MG PO TABS: 25.0000 mg | ORAL_TABLET | Freq: Two times a day (BID) | ORAL | Status: DC

## 2015-03-18 NOTE — Care Management Note (Signed)
Case Management Note  Patient Details  Name: CLEE COMAR MRN: [id number] Date of Birth: [birth year]  Subjective/Objective:                 Spoke with patient at bedside. Inependent patient from home with his wife who is followed at [place] in [place]. Patient has several walkers that he does not use and denies any needs for DME. Patient states that has no difficulties obtaining his medication. He and his wife both drive. No CM needs identified.   Action/Plan:  Independent patient will DC to home today with wife.  Expected Discharge Date:                  Expected Discharge Plan:  Home/Self Care  In-House Referral:     Discharge planning Services  CM Consult  Post Acute Care Choice:    Choice offered to:     DME Arranged:    DME Agency:     HH Arranged:    [place] Agency:     Status of Service:  Completed, signed off  Medicare Important Message Given:    Date Medicare IM Given:    Medicare IM give by:    Date Additional Medicare IM Given:    Additional Medicare Important Message give by:     If discussed at [place] of Stay Meetings, dates discussed:    Additional Comments:  [name], RN 03/18/2015, 11:41 AM

## 2015-03-18 NOTE — Progress Notes (Signed)
Patient ID: [name], male   DOB: 1945/07/03, 70 y.o.   MRN: [id number]   Subjective: [name] is feeling well today; he denies anymore lightheadedness and has been walking around the room without a problem. He has an oncology appointment next week for a bone marrow biopsy.  Objective: Vital signs in last 24 hours: Filed Vitals:   03/17/15 1715 03/17/15 1730 03/17/15 1941 03/18/15 0614  BP: 129/71 169/70 144/70 132/64  Pulse: 63 63 74 69  Temp:   98.7 F (37.1 C) 97.9 F (36.6 C)  TempSrc:   Oral Oral  Resp: 22 18 16 20   Height:   5' 10"  (1.778 m)   Weight:   96.2 kg (212 lb 1.3 oz) 92.1 kg (203 lb 0.7 oz)  SpO2: 100% 98% 98% 93%   Physical exam: General: very nice pale man resting in bed comfortably, appropriately conversational HEENT: pale conjuctiva Cardiac: regular rate and rhythm, with 3/6 systolic murmur Pulm: breathing well, clear to auscultation bilaterally Abd: bowel sounds normal, soft, nondistended, non-tender Ext: warm and well perfused, without pedal edema  Lab Results: Basic Metabolic Panel:  Recent Labs Lab 03/17/15 1058 03/18/15 0532  NA 133* 136  K 4.0 3.8  CL 102 100*  CO2 25 28  GLUCOSE 175* 121*  BUN 11 7  CREATININE 0.69 0.65  CALCIUM 9.8 10.2   Corrected calcium 10.9  Liver Function Tests:  Recent Labs Lab 03/17/15 1901 03/18/15 0532  AST 32 35  ALT 14* 6*  ALKPHOS 65 61  BILITOT 1.6* 0.8  PROT 8.1 7.5  ALBUMIN 3.4* 3.1*   CBC:  Recent Labs Lab 03/17/15 1058 03/18/15 0532  WBC 5.2 4.4  NEUTROABS 3.7 2.5  HGB 7.3* 8.2*  HCT 22.7* 24.8*  MCV 100.9* 98.0  PLT 146* 160   Anemia Panel:  Recent Labs Lab 03/17/15 1902  RETICCTPCT 7.7*   Studies/Results: Dg Bone Survey Met  03/17/2015  CLINICAL DATA:  Multiple myeloma with generalized pain EXAM: METASTATIC BONE SURVEY COMPARISON:  None. FINDINGS: There is generalized osteoporosis. Lateral skull: No blastic or lytic bone lesions. No air-fluid levels. Cervical spine:  There is generalized osteoarthritic change. No fracture or spondylolisthesis. There are no blastic or lytic bone lesions. Thoracic spine: No fracture or spondylolisthesis. No well-defined blastic or lytic bone lesions apparent. Lumbar spine: There is osteoarthritic change. No focal blastic or lytic bone lesions identified. No fracture or spondylolisthesis apparent. Chest: The lungs are clear. Heart size and pulmonary vascularity are normal. Patient is status post aortic valve replacement. No well-defined blastic or lytic bone lesions are identified. Pelvis: There are no well-defined blastic or lytic bone lesions. No fracture or dislocation. There is moderate symmetric narrowing of both hip joints. Right femur: There are scattered small lucent lesions throughout the proximal to mid femur. There is also a subtle lucency in the lateral right femoral neck. These lesions are concerning for foci of multiple myeloma. No fracture or impending fracture. Left femur: There are several subtle lucent lesions concerning for multiple myeloma. No fracture or impending fracture. Right tibia and fibula: No blastic or lytic bone lesions. No fracture or dislocation. Left tibia and fibula: No fracture or dislocation. No blastic or lytic bone lesions. Right shoulder and humerus: There are multiple small lytic lesions throughout the proximal to mid humerus, consistent with multiple myeloma. There also small lytic lesions in the lateral right scapula. No fracture or impending fracture. Left shoulder and humerus: There are lytic lesions in the proximal and mid  left humerus consistent with multiple myeloma. No fracture or impending fracture. Right forearm: There are several small lytic lesions in the proximal ulna consistent with multiple myeloma. No fracture or impending fracture. Left forearm: Several small lytic lesions are noted in the proximal left ulna, felt to be consistent with multiple myeloma. No fracture or impending fracture.  IMPRESSION: Multiple lytic lesions are noted in the extremities, consistent with multiple myeloma. Note that there is generalized osteoporosis. Multiple myeloma may present as osteoporosis ; this study may underestimate the extent of multiple myeloma change. No fractures or impending fractures are identified. There is osteoarthritic change at multiple sites in the spine as well as moderate narrowing of both hip joints. Lungs are clear. Electronically Signed   By: Lowella Grip III M.D.   On: 03/17/2015 19:01   Medications: I have reviewed the patient's current medications. Scheduled Meds: . carbidopa-levodopa  3 tablet Oral QID  . gabapentin  800 mg Oral TID  . insulin aspart  0-5 Units Subcutaneous QHS  . insulin aspart  0-9 Units Subcutaneous TID WC  . 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 I doubt this is an active bleed as he denies melena, an FOBT was negative, and he does not complain of back pain suggestive of a retroperitoneal hematoma. Another consideration is hemolysis from his aortic valve but his LDH was only slightly elevated, pointing away from hemolysis. Regardless, his hemoglobin appropriately normalized after 1 unit and he's walking around without a problem, so he's safe for discharge today with close follow-up with oncology. -Continue ferrous sulfate 368m QHS -Discharge to home today  Dispo: Discharge home today  The patient does have a current PCP (ABuzzy Han MD) and [facility].  The patient does not know have transportation limitations that hinder transportation to clinic appointments.  .Services Needed at time of  discharge: Y = Yes, Blank = No PT:   OT:   RN:   Equipment:   Other:     LOS: 1 day   KLoleta Chance MD 03/18/2015, 9:28 AM

## 2015-03-18 NOTE — Discharge Summary (Signed)
Name: CECILIO Ray MRN: [id number] DOB: 1946-01-17 70 y.o. PCP: Buzzy Han, MD  Date of Admission: 03/17/2015 10:35 AM Date of Discharge: 03/18/2015 Attending Physician: Thayer Headings, MD  Discharge Diagnosis:  Syncope, likely orthostatic and/or vasovagal  Acute on chronic anemia likely from progression of multiple myeloma   Discharge Medications:    Medication List    STOP taking these medications        cyclobenzaprine 10 MG tablet  Commonly known as:  FLEXERIL     famotidine 20 MG tablet  Commonly known as:  PEPCID     hypromellose 0.3 % Gel ophthalmic ointment  Commonly known as:  GENTEAL     pentosan polysulfate 100 MG capsule  Commonly known as:  ELMIRON     predniSONE 10 MG (21) Tbpk tablet  Commonly known as:  STERAPRED UNI-PAK 21 TAB     SYSTANE ULTRA OP      TAKE these medications        amLODipine 5 MG tablet  Commonly known as:  NORVASC  Take 1 tablet (5 mg total) by mouth daily.     aspirin 81 MG tablet  Take 81 mg by mouth daily.     carbidopa-levodopa 25-100 MG tablet  Commonly known as:  SINEMET IR  Take 3 tablets by mouth 4 (four) times daily.     carboxymethylcellulose 1 % ophthalmic solution  Apply 1 drop to eye 3 (three) times daily as needed (dry eyes).     cyanocobalamin 500 MCG tablet  Take 500 mcg by mouth 2 (two) times daily.     docusate sodium 100 MG capsule  Commonly known as:  COLACE  Take 100 mg by mouth 2 (two) times daily.     ferrous sulfate 325 (65 FE) MG tablet  Take 325 mg by mouth at bedtime.     finasteride 5 MG tablet  Commonly known as:  PROSCAR  Take 5 mg by mouth daily.     fish oil-omega-3 fatty acids 1000 MG capsule  Take 1 g by mouth 2 (two) times daily.     gabapentin 800 MG tablet  Commonly known as:  NEURONTIN  Take 800 mg by mouth 3 (three) times daily.     hydrochlorothiazide 12.5 MG capsule  Commonly known as:  MICROZIDE  TAKE 1 CAPSULE (12.5 MG TOTAL) BY MOUTH DAILY.     losartan 100 MG tablet  Commonly known as:  COZAAR  Take 100 mg by mouth at bedtime.     metFORMIN 500 MG tablet  Commonly known as:  GLUCOPHAGE  Take 500 mg by mouth 2 (two) times daily with a meal.     methylcellulose 1 % ophthalmic solution  Commonly known as:  ARTIFICIAL TEARS  Place 1 drop into both eyes at bedtime as needed (dry eyes).     metoprolol 50 MG tablet  Commonly known as:  LOPRESSOR  Take 0.5 tablets (25 mg total) by mouth 2 (two) times daily.     pantoprazole 40 MG tablet  Commonly known as:  PROTONIX  Take 40 mg by mouth daily as needed. For GERD     pravastatin 40 MG tablet  Commonly known as:  PRAVACHOL  Take 1 tablet (40 mg total) by mouth every evening.     Vitamin D 2000 units Caps  Take 1 capsule by mouth daily.     zolpidem 10 MG tablet  Commonly known as:  AMBIEN  Take 5 mg by mouth at bedtime as  needed for sleep.        Disposition and follow-up:   [name] was discharged from [facility] in Good condition.  At the hospital follow up visit please address:  1. Re-check CBC  2. Ensure he has gotten bone marrow biopsy and is following with oncology  Follow-up Appointments: Follow-up Information    Follow up with Buzzy Han, MD. Schedule an appointment as soon as possible for a visit in 1 day.   Specialty:  Family Medicine   Why:  Hospital follow-up   Contact information:   [address] [place] [address] [phone]       Follow up with Dr. Rico Sheehan. Call in 1 week.   Why:  [phone], ext [phone]      Procedures Performed:  Dg Bone Survey Met  03/17/2015  CLINICAL DATA:  Multiple myeloma with generalized pain EXAM: METASTATIC BONE SURVEY COMPARISON:  None. FINDINGS: There is generalized osteoporosis. Lateral skull: No blastic or lytic bone lesions. No air-fluid levels. Cervical spine: There is generalized osteoarthritic change. No fracture or  spondylolisthesis. There are no blastic or lytic bone lesions. Thoracic spine: No fracture or spondylolisthesis. No well-defined blastic or lytic bone lesions apparent. Lumbar spine: There is osteoarthritic change. No focal blastic or lytic bone lesions identified. No fracture or spondylolisthesis apparent. Chest: The lungs are clear. Heart size and pulmonary vascularity are normal. Patient is status post aortic valve replacement. No well-defined blastic or lytic bone lesions are identified. Pelvis: There are no well-defined blastic or lytic bone lesions. No fracture or dislocation. There is moderate symmetric narrowing of both hip joints. Right femur: There are scattered small lucent lesions throughout the proximal to mid femur. There is also a subtle lucency in the lateral right femoral neck. These lesions are concerning for foci of multiple myeloma. No fracture or impending fracture. Left femur: There are several subtle lucent lesions concerning for multiple myeloma. No fracture or impending fracture. Right tibia and fibula: No blastic or lytic bone lesions. No fracture or dislocation. Left tibia and fibula: No fracture or dislocation. No blastic or lytic bone lesions. Right shoulder and humerus: There are multiple small lytic lesions throughout the proximal to mid humerus, consistent with multiple myeloma. There also small lytic lesions in the lateral right scapula. No fracture or impending fracture. Left shoulder and humerus: There are lytic lesions in the proximal and mid left humerus consistent with multiple myeloma. No fracture or impending fracture. Right forearm: There are several small lytic lesions in the proximal ulna consistent with multiple myeloma. No fracture or impending fracture. Left forearm: Several small lytic lesions are noted in the proximal left ulna, felt to be consistent with multiple myeloma. No fracture or impending fracture. IMPRESSION: Multiple lytic lesions are noted in the  extremities, consistent with multiple myeloma. Note that there is generalized osteoporosis. Multiple myeloma may present as osteoporosis ; this study may underestimate the extent of multiple myeloma change. No fractures or impending fractures are identified. There is osteoarthritic change at multiple sites in the spine as well as moderate narrowing of both hip joints. Lungs are clear. Electronically Signed   By: Lowella Grip III M.D.   On: 03/17/2015 19:01   Admission HPI:   [name] is a 70 y.o. man with past medical history of chronic anemia, MGUS, aortic stenosis s/p aortic valve replacement with bioprosthetic valve (2006 and 2013), diabetes mellitus, hypertension, restless legs syndrome, GERD, HFpEF (EF  16-10%, grade 2 diastolic dysfunction in December 2016), and hyperlipidemia who presents to the Emergency Department due to a syncopal event.  Patient was at the [facility] this morning for multiple doctors appointments. States he was sitting in the exam room when he began to feel lightheaded and "funny." Patient reportedly then stood up and sat down again. At this point, his wife reports he lost consciousness, was not responding, and was breathing very heavily. Patient has no recall of this. He was apparently unconscious for approximately 3-4 minutes and then took approximately 10-15 minutes until he was responding more appropriately to questions. He still felt weak and his blood pressure was noted to be in the 96E systolic with HR of 45W. This improved to 098J systolic with 1 liter of IV fluids. Received another unit of fluids while being transported to ED. Patient reports similar history of syncopal episode in 2013 that was attributed to his aortic stenosis. States he felt well this morning before event and walked into the [place] without issue.  On review of systems, patient denies any chest pain, orthopnea, PND, shortness of breath, loss of bowel/bladder control, nausea,  vomiting, melena, abdominal pain. He does report feeling flushed and hot before syncopizing, lightheadedness, decreased energy x 2 months, intermittent blood on toilet paper with wiping but denies hematochezia. Other 10-point review of systems negative.  Hospital Course by problem list:   Syncope: Likely orthostatic and/or vasovagal. He improved with IV fluids and was transfused pRBC x 1 for concern for GI blood loss in the ED though FOBT was negative. Hb improved from 7 to 8.  Home antihypertensives were held, and he was mentating well on the day of discharge.  Acute on chronic anemia: Likely progression of underlying MGUS to multiple myeloma. Corrected calcium was 10.8 and lytic bone lesions were seen on skeletal X-rays [see above], concerning for progression from MGUS to multiple myeloma. Other notable labs included B2-microalbumin 2.4, LDH 194. Serum kappa/light chains were pending. He was encouraged to follow-up with hem/onc for bone marrow biopsy to which he acknowldged understanding. Marland Kitchen  Discharge Vitals:   BP 132/64 mmHg  Pulse 69  Temp(Src) 97.9 F (36.6 C) (Oral)  Resp 20  Ht 5' 10"  (1.778 m)  Wt 203 lb 0.7 oz (92.1 kg)  BMI 29.13 kg/m2  SpO2 93%  Discharge Labs:  Results for orders placed or performed during the hospital encounter of 03/17/15 (from the past 24 hour(s))  Urinalysis, Routine w reflex microscopic (not at [facility])     Status: None   Collection Time: 03/17/15  3:27 PM  Result Value Ref Range   Color, Urine YELLOW YELLOW   APPearance CLEAR CLEAR   Specific Gravity, Urine 1.014 1.005 - 1.030   pH 6.0 5.0 - 8.0   Glucose, UA NEGATIVE NEGATIVE mg/dL   Hgb urine dipstick NEGATIVE NEGATIVE   Bilirubin Urine NEGATIVE NEGATIVE   Ketones, ur NEGATIVE NEGATIVE mg/dL   Protein, ur NEGATIVE NEGATIVE mg/dL   Nitrite NEGATIVE NEGATIVE   Leukocytes, UA NEGATIVE NEGATIVE  CBG monitoring, ED     Status: Abnormal   Collection Time: 03/17/15  3:58 PM  Result Value Ref Range    Glucose-Capillary 113 (H) 65 - 99 mg/dL  CBG monitoring, ED     Status: Abnormal   Collection Time: 03/17/15  5:16 PM  Result Value Ref Range   Glucose-Capillary 106 (H) 65 - 99 mg/dL  Hepatic function panel     Status: Abnormal   Collection Time: 03/17/15  7:01 PM  Result Value Ref Range   Total Protein 8.1 6.5 - 8.1 g/dL   Albumin 3.4 (L) 3.5 - 5.0 g/dL   AST 32 15 - 41 U/L   ALT 14 (L) 17 - 63 U/L   Alkaline Phosphatase 65 38 - 126 U/L   Total Bilirubin 1.6 (H) 0.3 - 1.2 mg/dL   Bilirubin, Direct 0.3 0.1 - 0.5 mg/dL   Indirect Bilirubin 1.3 (H) 0.3 - 0.9 mg/dL  Save smear     Status: None   Collection Time: 03/17/15  7:01 PM  Result Value Ref Range   Smear Review SMEAR STAINED AND AVAILABLE FOR REVIEW   Lactate dehydrogenase     Status: Abnormal   Collection Time: 03/17/15  7:01 PM  Result Value Ref Range   LDH 194 (H) 98 - 192 U/L  Reticulocytes     Status: Abnormal   Collection Time: 03/17/15  7:02 PM  Result Value Ref Range   Retic Ct Pct 7.7 (H) 0.4 - 3.1 %   RBC. 2.66 (L) 4.22 - 5.81 MIL/uL   Retic Count, Manual 204.8 (H) 19.0 - 186.0 K/uL  Protime-INR     Status: None   Collection Time: 03/17/15  7:02 PM  Result Value Ref Range   Prothrombin Time 13.8 11.6 - 15.2 seconds   INR 1.03 0.00 - 1.49  Beta 2 microglobulin, serum     Status: None   Collection Time: 03/17/15  7:02 PM  Result Value Ref Range   Beta-2 Microglobulin 2.4 0.6 - 2.4 mg/L  Comprehensive metabolic panel     Status: Abnormal   Collection Time: 03/18/15  5:32 AM  Result Value Ref Range   Sodium 136 135 - 145 mmol/L   Potassium 3.8 3.5 - 5.1 mmol/L   Chloride 100 (L) 101 - 111 mmol/L   CO2 28 22 - 32 mmol/L   Glucose, Bld 121 (H) 65 - 99 mg/dL   BUN 7 6 - 20 mg/dL   Creatinine, Ser 0.65 0.61 - 1.24 mg/dL   Calcium 10.2 8.9 - 10.3 mg/dL   Total Protein 7.5 6.5 - 8.1 g/dL   Albumin 3.1 (L) 3.5 - 5.0 g/dL   AST 35 15 - 41 U/L   ALT 6 (L) 17 - 63 U/L   Alkaline Phosphatase 61 38 - 126 U/L    Total Bilirubin 0.8 0.3 - 1.2 mg/dL   GFR calc non Af Amer >60 >60 mL/min   GFR calc Af Amer >60 >60 mL/min   Anion gap 8 5 - 15  CBC with Differential/Platelet     Status: Abnormal   Collection Time: 03/18/15  5:32 AM  Result Value Ref Range   WBC 4.4 4.0 - 10.5 K/uL   RBC 2.53 (L) 4.22 - 5.81 MIL/uL   Hemoglobin 8.2 (L) 13.0 - 17.0 g/dL   HCT 24.8 (L) 39.0 - 52.0 %   MCV 98.0 78.0 - 100.0 fL   MCH 32.4 26.0 - 34.0 pg   MCHC 33.1 30.0 - 36.0 g/dL   RDW 17.0 (H) 11.5 - 15.5 %   Platelets 160 150 - 400 K/uL   Neutrophils Relative % 58 %   Neutro Abs 2.5 1.7 - 7.7 K/uL   Lymphocytes Relative 31 %   Lymphs Abs 1.3 0.7 - 4.0 K/uL   Monocytes Relative 10 %   Monocytes Absolute 0.4 0.1 - 1.0 K/uL   Eosinophils Relative 1 %   Eosinophils Absolute 0.1 0.0 - 0.7 K/uL  Basophils Relative 0 %   Basophils Absolute 0.0 0.0 - 0.1 K/uL  Glucose, capillary     Status: Abnormal   Collection Time: 03/18/15  9:01 AM  Result Value Ref Range   Glucose-Capillary 159 (H) 65 - 99 mg/dL    Signed: Jule Ser, DO 03/18/2015, 11:21 AM

## 2015-03-18 NOTE — Progress Notes (Signed)
Nsg Discharge Note  Admit Date:  03/17/2015 Discharge date: 03/18/2015   Earney Mallet to be D/C'd Home per MD order.  AVS completed.  Copy for chart, and copy for patient signed, and dated. Patient/caregiver able to verbalize understanding.  Discharge Medication:   Medication List    STOP taking these medications        cyclobenzaprine 10 MG tablet  Commonly known as:  FLEXERIL     famotidine 20 MG tablet  Commonly known as:  PEPCID     hypromellose 0.3 % Gel ophthalmic ointment  Commonly known as:  GENTEAL     pentosan polysulfate 100 MG capsule  Commonly known as:  ELMIRON     predniSONE 10 MG (21) Tbpk tablet  Commonly known as:  STERAPRED UNI-PAK 21 TAB     SYSTANE ULTRA OP      TAKE these medications        amLODipine 5 MG tablet  Commonly known as:  NORVASC  Take 1 tablet (5 mg total) by mouth daily.     aspirin 81 MG tablet  Take 81 mg by mouth daily.     carbidopa-levodopa 25-100 MG tablet  Commonly known as:  SINEMET IR  Take 3 tablets by mouth 4 (four) times daily.     carboxymethylcellulose 1 % ophthalmic solution  Apply 1 drop to eye 3 (three) times daily as needed (dry eyes).     cyanocobalamin 500 MCG tablet  Take 500 mcg by mouth 2 (two) times daily.     docusate sodium 100 MG capsule  Commonly known as:  COLACE  Take 100 mg by mouth 2 (two) times daily.     ferrous sulfate 325 (65 FE) MG tablet  Take 325 mg by mouth at bedtime.     finasteride 5 MG tablet  Commonly known as:  PROSCAR  Take 5 mg by mouth daily.     fish oil-omega-3 fatty acids 1000 MG capsule  Take 1 g by mouth 2 (two) times daily.     gabapentin 800 MG tablet  Commonly known as:  NEURONTIN  Take 800 mg by mouth 3 (three) times daily.     hydrochlorothiazide 12.5 MG capsule  Commonly known as:  MICROZIDE  TAKE 1 CAPSULE (12.5 MG TOTAL) BY MOUTH DAILY.     losartan 100 MG tablet  Commonly known as:  COZAAR  Take 100 mg by mouth at bedtime.     metFORMIN 500 MG  tablet  Commonly known as:  GLUCOPHAGE  Take 500 mg by mouth 2 (two) times daily with a meal.     methylcellulose 1 % ophthalmic solution  Commonly known as:  ARTIFICIAL TEARS  Place 1 drop into both eyes at bedtime as needed (dry eyes).     metoprolol 50 MG tablet  Commonly known as:  LOPRESSOR  Take 0.5 tablets (25 mg total) by mouth 2 (two) times daily.     pantoprazole 40 MG tablet  Commonly known as:  PROTONIX  Take 40 mg by mouth daily as needed. For GERD     pravastatin 40 MG tablet  Commonly known as:  PRAVACHOL  Take 1 tablet (40 mg total) by mouth every evening.     Vitamin D 2000 units Caps  Take 1 capsule by mouth daily.     zolpidem 10 MG tablet  Commonly known as:  AMBIEN  Take 5 mg by mouth at bedtime as needed for sleep.        Discharge  Assessment: Filed Vitals:   03/17/15 1941 03/18/15 0614  BP: 144/70 132/64  Pulse: 74 69  Temp: 98.7 F (37.1 C) 97.9 F (36.6 C)  Resp: 16 20   Skin clean, dry and intact without evidence of skin break down, no evidence of skin tears noted. IV catheter discontinued intact. Site without signs and symptoms of complications - no redness or edema noted at insertion site, patient denies c/o pain - only slight tenderness at site.  Dressing with slight pressure applied.  D/c Instructions-Education: Discharge instructions given to patient/family with verbalized understanding. D/c education completed with patient/family including follow up instructions, medication list, d/c activities limitations if indicated, with other d/c instructions as indicated by MD - patient able to verbalize understanding, all questions fully answered. Patient instructed to return to ED, call 911, or call MD for any changes in condition.  Patient escorted via [employer], and D/C home via private auto.  Imad Shostak Margaretha Sheffield, RN 03/18/2015 1:32 PM

## 2015-03-18 NOTE — Discharge Instructions (Signed)
Please hold her blood pressure medications until you're able to see her primary care doctor again as we think it may have contributed to your passing out yesterday.  Please follow-up with your hematologist, Dr. Rico Sheehan, next week for bone marrow biopsy.

## 2015-03-25 ENCOUNTER — Other Ambulatory Visit (HOSPITAL_COMMUNITY): Payer: Self-pay | Admitting: Family Medicine

## 2015-03-25 DIAGNOSIS — C9000 Multiple myeloma not having achieved remission: Secondary | ICD-10-CM

## 2015-03-27 ENCOUNTER — Telehealth: Payer: Self-pay | Admitting: Cardiology

## 2015-03-27 NOTE — Telephone Encounter (Signed)
Ok foe bone marrow bx [name]

## 2015-03-27 NOTE — Telephone Encounter (Signed)
Will forward for dr crenshaw review  

## 2015-03-27 NOTE — Telephone Encounter (Signed)
New message     Talk to the nurse about a bone marrow procedure he is having on Monday at 6:45.

## 2015-03-27 NOTE — Telephone Encounter (Signed)
will forward this note

## 2015-03-27 NOTE — Telephone Encounter (Signed)
Pt calling for medical clearance for a bone marrow biopsy.  Request for surgical clearance:  1. What type of surgery is being performed?  Bone marrow biopsy  2. When is this surgery scheduled? 2/20  3. Are there any medications that need to be held prior to surgery and how long?  no  4.     Name of physician performing surgery? Dr. Darlina Sicilian  5. What is your office phone and fax number? Does not have. Medical clearance can be sent through epic

## 2015-03-30 ENCOUNTER — Encounter (HOSPITAL_COMMUNITY): Payer: Self-pay

## 2015-03-30 ENCOUNTER — Ambulatory Visit (HOSPITAL_COMMUNITY)
Admission: RE | Admit: 2015-03-30 | Discharge: 2015-03-30 | Disposition: A | Discharge status: Home / Self Care | Payer: No Typology Code available for payment source | Source: Ambulatory Visit | Attending: Family Medicine | Admitting: Family Medicine

## 2015-03-30 DIAGNOSIS — E785 Hyperlipidemia, unspecified: Secondary | ICD-10-CM | POA: Diagnosis not present

## 2015-03-30 DIAGNOSIS — E119 Type 2 diabetes mellitus without complications: Secondary | ICD-10-CM | POA: Insufficient documentation

## 2015-03-30 DIAGNOSIS — K589 Irritable bowel syndrome without diarrhea: Secondary | ICD-10-CM | POA: Insufficient documentation

## 2015-03-30 DIAGNOSIS — Z8249 Family history of ischemic heart disease and other diseases of the circulatory system: Secondary | ICD-10-CM | POA: Insufficient documentation

## 2015-03-30 DIAGNOSIS — G4733 Obstructive sleep apnea (adult) (pediatric): Secondary | ICD-10-CM | POA: Diagnosis not present

## 2015-03-30 DIAGNOSIS — Z79899 Other long term (current) drug therapy: Secondary | ICD-10-CM | POA: Diagnosis not present

## 2015-03-30 DIAGNOSIS — Z953 Presence of xenogenic heart valve: Secondary | ICD-10-CM | POA: Diagnosis not present

## 2015-03-30 DIAGNOSIS — C9000 Multiple myeloma not having achieved remission: Secondary | ICD-10-CM | POA: Insufficient documentation

## 2015-03-30 DIAGNOSIS — K219 Gastro-esophageal reflux disease without esophagitis: Secondary | ICD-10-CM | POA: Insufficient documentation

## 2015-03-30 DIAGNOSIS — Z87891 Personal history of nicotine dependence: Secondary | ICD-10-CM | POA: Diagnosis not present

## 2015-03-30 DIAGNOSIS — J45909 Unspecified asthma, uncomplicated: Secondary | ICD-10-CM | POA: Insufficient documentation

## 2015-03-30 DIAGNOSIS — I10 Essential (primary) hypertension: Secondary | ICD-10-CM | POA: Diagnosis not present

## 2015-03-30 DIAGNOSIS — G2581 Restless legs syndrome: Secondary | ICD-10-CM | POA: Insufficient documentation

## 2015-03-30 DIAGNOSIS — D4989 Neoplasm of unspecified behavior of other specified sites: Secondary | ICD-10-CM | POA: Diagnosis not present

## 2015-03-30 DIAGNOSIS — D539 Nutritional anemia, unspecified: Secondary | ICD-10-CM | POA: Insufficient documentation

## 2015-03-30 DIAGNOSIS — Z7984 Long term (current) use of oral hypoglycemic drugs: Secondary | ICD-10-CM | POA: Insufficient documentation

## 2015-03-30 DIAGNOSIS — M899 Disorder of bone, unspecified: Secondary | ICD-10-CM | POA: Diagnosis not present

## 2015-03-30 LAB — CBC WITH DIFFERENTIAL/PLATELET
BASOS PCT: 0 %
Basophils Absolute: 0 10*3/uL (ref 0.0–0.1)
Eosinophils Absolute: 0 10*3/uL (ref 0.0–0.7)
Eosinophils Relative: 1 %
HEMATOCRIT: 24.5 % — AB (ref 39.0–52.0)
HEMOGLOBIN: 7.7 g/dL — AB (ref 13.0–17.0)
LYMPHS ABS: 1 10*3/uL (ref 0.7–4.0)
Lymphocytes Relative: 23 %
MCH: 32.1 pg (ref 26.0–34.0)
MCHC: 31.4 g/dL (ref 30.0–36.0)
MCV: 102.1 fL — ABNORMAL HIGH (ref 78.0–100.0)
MONOS PCT: 10 %
Monocytes Absolute: 0.4 10*3/uL (ref 0.1–1.0)
NEUTROS ABS: 2.8 10*3/uL (ref 1.7–7.7)
NEUTROS PCT: 66 %
Platelets: 200 10*3/uL (ref 150–400)
RBC: 2.4 MIL/uL — ABNORMAL LOW (ref 4.22–5.81)
RDW: 17.3 % — ABNORMAL HIGH (ref 11.5–15.5)
WBC: 4.3 10*3/uL (ref 4.0–10.5)

## 2015-03-30 LAB — BONE MARROW EXAM

## 2015-03-30 LAB — GLUCOSE, CAPILLARY: GLUCOSE-CAPILLARY: 139 mg/dL — AB (ref 65–99)

## 2015-03-30 LAB — PROTIME-INR
INR: 0.92 (ref 0.00–1.49)
Prothrombin Time: 12.6 seconds (ref 11.6–15.2)

## 2015-03-30 MED ORDER — FENTANYL CITRATE (PF) 100 MCG/2ML IJ SOLN
INTRAMUSCULAR | Status: AC
  Filled 2015-03-30: qty 4

## 2015-03-30 MED ORDER — MIDAZOLAM HCL 2 MG/2ML IJ SOLN
INTRAMUSCULAR | Status: AC
  Filled 2015-03-30: qty 6

## 2015-03-30 MED ORDER — MIDAZOLAM HCL 2 MG/2ML IJ SOLN
INTRAMUSCULAR | Status: AC | PRN
  Administered 2015-03-30 (×4): 1 mg via INTRAVENOUS

## 2015-03-30 MED ORDER — SODIUM CHLORIDE 0.9 % IV SOLN
Freq: Once | INTRAVENOUS | Status: AC
  Administered 2015-03-30: 07:00:00 via INTRAVENOUS

## 2015-03-30 MED ORDER — FENTANYL CITRATE (PF) 100 MCG/2ML IJ SOLN
INTRAMUSCULAR | Status: AC | PRN
  Administered 2015-03-30: 50 ug via INTRAVENOUS

## 2015-03-30 MED ORDER — SODIUM CHLORIDE 0.9 % IV SOLN
INTRAVENOUS | Status: DC
Start: 2015-03-30 — End: 2015-03-31

## 2015-03-30 NOTE — Discharge Instructions (Signed)
Bone Marrow Aspiration and Bone Marrow Biopsy Bone marrow aspiration and bone marrow biopsy are procedures that are done to diagnose blood disorders. You may also have one of these procedures to help diagnose infections or some types of cancer. Bone marrow is the soft tissue that is inside your bones. Blood cells are produced in bone marrow. For bone marrow aspiration, a sample of tissue in liquid form is removed from inside your bone. For a bone marrow biopsy, a small core of bone marrow tissue is removed. Then these samples are examined under a microscope or tested in a lab. You may need these procedures if you have an abnormal complete blood count (CBC). The aspiration or biopsy sample is usually taken from the top of your hip bone. Sometimes, an aspiration sample is taken from your chest bone (sternum). LET [facility] CARE PROVIDER KNOW ABOUT:  Any allergies you have.  All medicines you are taking, including vitamins, herbs, eye drops, creams, and over-the-counter medicines.  Previous problems you or members of your family have had with the use of anesthetics.  Any blood disorders you have.  Previous surgeries you have had.  Any medical conditions you may have.  Whether you are pregnant or you think that you may be pregnant. RISKS AND COMPLICATIONS Generally, this is a safe procedure. However, problems may occur, including:  Infection.  Bleeding. BEFORE THE PROCEDURE  Ask your health care provider about:  Changing or stopping your regular medicines. This is especially important if you are taking diabetes medicines or blood thinners.  Taking medicines such as aspirin and ibuprofen. These medicines can thin your blood. Do not take these medicines before your procedure if your health care provider instructs you not to.  Plan to have someone take you home after the procedure.  If you go home right after the procedure, plan to have someone with you for 24 hours. PROCEDURE   An  IV tube may be inserted into one of your veins.  The injection site will be cleaned with a germ-killing solution (antiseptic).  You will be given one or more of the following:  A medicine that helps you relax (sedative).  A medicine that numbs the area (local anesthetic).  The bone marrow sample will be removed as follows:  For an aspiration, a hollow needle will be inserted through your skin and into your bone. Bone marrow fluid will be drawn up into a syringe.  For a biopsy, your health care provider will use a hollow needle to remove a core of tissue from your bone marrow.  The needle will be removed.  A bandage (dressing) will be placed over the insertion site and taped in place. The procedure may vary among health care providers and hospitals. AFTER THE PROCEDURE  Your blood pressure, heart rate, breathing rate, and blood oxygen level will be monitored often until the medicines you were given have worn off.  Return to your normal activities as directed by your health care provider.   This information is not intended to replace advice given to you by your health care provider. Make sure you discuss any questions you have with your health care provider.   Document Released: 01/28/2004 Document Revised: 06/10/2014 Document Reviewed: 01/15/2014 Elsevier Interactive Patient Education 2016 Elsevier Inc. Moderate Conscious Sedation, Adult Sedation is the use of medicines to promote relaxation and relieve discomfort and anxiety. Moderate conscious sedation is a type of sedation. Under moderate conscious sedation you are less alert than normal but are still  able to respond to instructions or stimulation. Moderate conscious sedation is used during short medical and dental procedures. It is milder than deep sedation or general anesthesia and allows you to return to your regular activities sooner. LET [facility] CARE PROVIDER KNOW ABOUT:   Any allergies you have.  All medicines you are  taking, including vitamins, herbs, eye drops, creams, and over-the-counter medicines.  Use of steroids (by mouth or creams).  Previous problems you or members of your family have had with the use of anesthetics.  Any blood disorders you have.  Previous surgeries you have had.  Medical conditions you have.  Possibility of pregnancy, if this applies.  Use of cigarettes, alcohol, or illegal drugs. RISKS AND COMPLICATIONS Generally, this is a safe procedure. However, as with any procedure, problems can occur. Possible problems include:  Oversedation.  Trouble breathing on your own. You may need to have a breathing tube until you are awake and breathing on your own.  Allergic reaction to any of the medicines used for the procedure. BEFORE THE PROCEDURE  You may have blood tests done. These tests can help show how well your kidneys and liver are working. They can also show how well your blood clots.  A physical exam will be done.  Only take medicines as directed by your health care provider. You may need to stop taking medicines (such as blood thinners, aspirin, or nonsteroidal anti-inflammatory drugs) before the procedure.   Do not eat or drink at least 6 hours before the procedure or as directed by your health care provider.  Arrange for a responsible adult, family member, or friend to take you home after the procedure. He or she should stay with you for at least 24 hours after the procedure, until the medicine has worn off. PROCEDURE   An intravenous (IV) catheter will be inserted into one of your veins. Medicine will be able to flow directly into your body through this catheter. You may be given medicine through this tube to help prevent pain and help you relax.  The medical or dental procedure will be done. AFTER THE PROCEDURE  You will stay in a recovery area until the medicine has worn off. Your blood pressure and pulse will be checked.   Depending on the procedure  you had, you may be allowed to go home when you can tolerate liquids and your pain is under control.   This information is not intended to replace advice given to you by your health care provider. Make sure you discuss any questions you have with your health care provider.   Document Released: 10/19/2000 Document Revised: 02/14/2014 Document Reviewed: 10/01/2012 Elsevier Interactive Patient Education [employer].

## 2015-03-30 NOTE — Discharge Instructions (Signed)
Moderate Conscious Sedation, Adult, Care After °Refer to this sheet in the next few weeks. These instructions provide you with information on caring for yourself after your procedure. Your health care provider may also give you more specific instructions. Your treatment has been planned according to current medical practices, but problems sometimes occur. Call your health care provider if you have any problems or questions after your procedure. °WHAT TO EXPECT AFTER THE PROCEDURE  °After your procedure: °· You may feel sleepy, clumsy, and have poor balance for several hours. °· Vomiting may occur if you eat too soon after the procedure. °HOME CARE INSTRUCTIONS °· Do not participate in any activities where you could become injured for at least 24 hours. Do not: °¨ Drive. °¨ Swim. °¨ Ride a bicycle. °¨ Operate heavy machinery. °¨ Cook. °¨ Use power tools. °¨ Climb ladders. °¨ Work from a high place. °· Do not make important decisions or sign legal documents until you are improved. °· If you vomit, drink water, juice, or soup when you can drink without vomiting. Make sure you have little or no nausea before eating solid foods. °· Only take over-the-counter or prescription medicines for pain, discomfort, or fever as directed by your health care provider. °· Make sure you and your family fully understand everything about the medicines given to you, including what side effects may occur. °· You should not drink alcohol, take sleeping pills, or take medicines that cause drowsiness for at least 24 hours. °· If you smoke, do not smoke without supervision. °· If you are feeling better, you may resume normal activities 24 hours after you were sedated. °· Keep all appointments with your health care provider. °SEEK MEDICAL CARE IF: °· Your skin is pale or bluish in color. °· You continue to feel nauseous or vomit. °· Your pain is getting worse and is not helped by medicine. °· You have bleeding or swelling. °· You are still  sleepy or feeling clumsy after 24 hours. °SEEK IMMEDIATE MEDICAL CARE IF: °· You develop a rash. °· You have difficulty breathing. °· You develop any type of allergic problem. °· You have a fever. °MAKE SURE YOU: °· Understand these instructions. °· Will watch your condition. °· Will get help right away if you are not doing well or get worse. °  °This information is not intended to replace advice given to you by your health care provider. Make sure you discuss any questions you have with your health care provider. °  °Document Released: 11/14/2012 Document Revised: 02/14/2014 Document Reviewed: 11/14/2012 °Elsevier Interactive Patient Education ©2016 [employer] °Bone Marrow Aspiration and Bone Marrow Biopsy, Care After °Refer to this sheet in the next few weeks. These instructions provide you with information about caring for yourself after your procedure. Your health care provider may also give you more specific instructions. Your treatment has been planned according to current medical practices, but problems sometimes occur. Call your health care provider if you have any problems or questions after your procedure. °WHAT TO EXPECT AFTER THE PROCEDURE °After your procedure, it is common to have: °· Soreness or tenderness around the puncture site. °· Bruising. °HOME CARE INSTRUCTIONS °· Take medicines only as directed by your health care provider. °· Follow your health care provider's instructions about: °¨ Puncture site care. °¨ Bandage (dressing) changes and removal. °· Bathe and shower as directed by your health care provider. °· Check your puncture site every day for signs of infection. Watch for: °¨ Redness, swelling, or pain. °¨   Fluid, blood, or pus. °· Return to your normal activities as directed by your health care provider. °· Keep all follow-up visits as directed by your health care provider. This is important. °SEEK MEDICAL CARE IF: °· You have a fever. °· You have uncontrollable bleeding. °· You have  redness, swelling, or pain at the site of your puncture. °· You have fluid, blood, or pus coming from your puncture site. °  °This information is not intended to replace advice given to you by your health care provider. Make sure you discuss any questions you have with your health care provider. °  °Document Released: 08/13/2004 Document Revised: 06/10/2014 Document Reviewed: 01/15/2014 °Elsevier Interactive Patient Education ©2016 Elsevier Inc. ° °

## 2015-03-30 NOTE — Procedures (Signed)
Technically successful CT guided bone marrow aspiration and biopsy of right iliac crest. EBL: None No immediate complications.    SignedSandi Mariscal [phone] 03/30/2015, 9:53 AM

## 2015-03-30 NOTE — H&P (Signed)
Chief Complaint: Patient was seen in consultation today for CT guided bone marrow biopsy  Referring Physician(s): Okwubunka-Anyim,Ahunna  History of Present Illness: [name] is a 70 y.o. male with PMH significant for hypertension, GERD, obstructive sleep apnea, irritable bowel syndrome, diabetes, aortic stenosis status post aortic valve replacement with bioprosthetic valve 2006/2013 and restless leg syndrome. 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 Bone survey has demonstrated multiple lytic lesions in the extremities consistent with multiple myeloma. He presents today for CT-guided bone marrow biopsy for further evaluation.  Past Medical History  Diagnosis Date  . HTN (hypertension)   . GERD (gastroesophageal reflux disease)     bravo pH study 2008  . Hyperlipidemia   . Hemorrhoids     external and internal  . Insomnia   . Allergic rhinitis   . OA (osteoarthritis)   . Hiatal hernia   . BPH (benign prostatic hypertrophy)   . Chronic cough   . Anemia 2009  . Diverticulosis of colon     on colonoscopy 2008  . OSA (obstructive sleep apnea)     USES CPAP AS NEEDED  . Headache(784.0)   . IBS (irritable bowel syndrome)   . Periodontitis     chronic with bone loss  . Gout   . Asthma   . Depression     pt denies  . Cataract   . Chronic interstitial cystitis   . Aortic stenosis     a. s/p porcine AVR 2006;  b. s/p redo tissue AVR 12/2011 (Dr. Roxy Manns) - preAVR LHC with no CAD  . H/O aortic valve replacement with porcine valve     2006, 2013  . Diabetes mellitus     type 2  . S/P aortic valve replacement with bioprosthetic valve 12/06/2011    Redo AVR using 23 mm [facility] Ease pericardial tissue valve  . Hx of echocardiogram     a. Echo  (post AVR) 12/2011:  mod LVH, EF 60-65%, Gr 2 diast dysfn, mild AI, AVR ok (mean gradient 19 mmHg), MAC, mild BAE  . Diabetes mellitus without  complication ([place])   . Hypertension   . Hyperthyroidism   . Restless leg syndrome     Past Surgical History  Procedure Laterality Date  . Aortic valve replacement  10/13/2004    34m [place] pericardial tissue valve  . Nasal septoplasty w/ turbinoplasty    . Right knee arthroscopy    . Bunionectomy      right  . Cataract extraction  2009    &   2012    BILATERAL  . Esophagogastroduodenoscopy  08/30/2011    Procedure: ESOPHAGOGASTRODUODENOSCOPY (EGD);  Surgeon: RInda Castle MD;  [place] [facility]  Service: Endoscopy;  Laterality: N/A;  Rm 3005   . Colonoscopy  08/31/2011    Procedure: COLONOSCOPY;  Surgeon: RInda Castle MD;  [place] [facility]  Service: Endoscopy;  Laterality: N/A;  . Refractive surgery      bilateral  . Aortic valve replacement  12/06/2011    Procedure: REDO AORTIC VALVE REPLACEMENT (AVR);  Surgeon: CRexene Alberts MD;  [place]  Service: Open Heart Surgery;  Laterality: N/A;  . Left heart catheterization with coronary angiogram N/A 11/30/2011    Procedure: LEFT HEART CATHETERIZATION WITH CORONARY ANGIOGRAM;  Surgeon: CBurnell Blanks MD;  [facility];  Service: Cardiovascular;  Laterality: N/A;  . Right heart  catheterization Bilateral 11/30/2011    Procedure: RIGHT HEART CATH;  Surgeon: Burnell Blanks, MD;  Location: [facility] C[facility] LAB;  Service: Cardiovascular;  Laterality: Bilateral;  . Aorta - femoral artery bypass graft    . Hernia repair    . Joint replacement      knee  . Cardiac surgery      aorta vavle replacement    Allergies: Spironolactone; Avodart; Avodart; Codeine; Diazepam; Diazepam; Doxazosin; Doxazosin; Feraheme; and Spironolactone  Medications: Prior to Admission medications   Medication Sig Start Date End Date Taking? Authorizing Provider  amLODipine (NORVASC) 5 MG tablet Take 1 tablet (5 mg total) by mouth daily. 01/19/15   Lelon Perla, MD  aspirin 81 MG tablet Take 81 mg by  mouth daily.    Historical Provider, MD  carbidopa-levodopa (SINEMET IR) 25-100 MG tablet Take 3 tablets by mouth 4 (four) times daily.     Historical Provider, MD  carboxymethylcellulose 1 % ophthalmic solution Apply 1 drop to eye 3 (three) times daily as needed (dry eyes).    Historical Provider, MD  Cholecalciferol (VITAMIN D) 2000 UNITS CAPS Take 1 capsule by mouth daily.    Historical Provider, MD  cyanocobalamin 500 MCG tablet Take 500 mcg by mouth 2 (two) times daily.    Historical Provider, MD  docusate sodium (COLACE) 100 MG capsule Take 100 mg by mouth 2 (two) times daily.    Historical Provider, MD  ferrous sulfate 325 (65 FE) MG tablet Take 325 mg by mouth at bedtime.    Historical Provider, MD  finasteride (PROSCAR) 5 MG tablet Take 5 mg by mouth daily.    Historical Provider, MD  fish oil-omega-3 fatty acids 1000 MG capsule Take 1 g by mouth 2 (two) times daily.     Historical Provider, MD  gabapentin (NEURONTIN) 800 MG tablet Take 800 mg by mouth 3 (three) times daily.    Historical Provider, MD  hydrochlorothiazide (MICROZIDE) 12.5 MG capsule TAKE 1 CAPSULE (12.5 MG TOTAL) BY MOUTH DAILY. 03/10/15   Lelon Perla, MD  losartan (COZAAR) 100 MG tablet Take 100 mg by mouth at bedtime. 01/20/12   Lelon Perla, MD  metFORMIN (GLUCOPHAGE) 500 MG tablet Take 500 mg by mouth 2 (two) times daily with a meal.    Historical Provider, MD  methylcellulose (ARTIFICIAL TEARS) 1 % ophthalmic solution Place 1 drop into both eyes at bedtime as needed (dry eyes).    Historical Provider, MD  metoprolol (LOPRESSOR) 50 MG tablet Take 0.5 tablets (25 mg total) by mouth 2 (two) times daily. 03/18/15   Jule Ser, DO  pantoprazole (PROTONIX) 40 MG tablet Take 40 mg by mouth daily as needed. For GERD    Historical Provider, MD  pravastatin (PRAVACHOL) 40 MG tablet Take 1 tablet (40 mg total) by mouth every evening. 01/19/15   Lelon Perla, MD  zolpidem (AMBIEN) 10 MG tablet Take 5 mg by mouth at  bedtime as needed for sleep.    Historical Provider, MD     Family History  Problem Relation Age of Onset  . Heart disease Father   . Osteoarthritis Mother   . Hypertension Sister   . Hyperlipidemia      Social History   Social History  . Marital Status: Married    Spouse Name: N/A  . Number of Children: 0  . Years of Education: N/A   Occupational History  . retired    Social History Main Topics  . Smoking status: Former Smoker  Quit date: 09/30/1971  . Smokeless tobacco: Never Used  . Alcohol Use: No  . Drug Use: No  . Sexual Activity: Not Currently   Other Topics Concern  . Not on file   Social History Narrative   ** Merged History Encounter **          Review of Systems  Constitutional: Negative for fever and chills.  Respiratory: Positive for shortness of breath.        Occ cough  Gastrointestinal: Positive for abdominal pain and blood in stool. Negative for nausea and vomiting.  Genitourinary: Negative for dysuria and hematuria.  Musculoskeletal: Positive for back pain.  Neurological: Positive for headaches.       Restless legs  Psychiatric/Behavioral: The patient is nervous/anxious.     Vital Signs: Blood pressure 152/80, temp 98.3, heart rate 79, respirations 20, oxygen saturation is 100% room air   Physical Exam  Constitutional: He is oriented to person, place, and time. He appears well-developed and well-nourished.  Cardiovascular: Normal rate and regular rhythm.   Murmur heard. Pulmonary/Chest: Effort normal.  Sl dim BS bases  Abdominal: Soft. Bowel sounds are normal. There is tenderness.  Musculoskeletal: Normal range of motion. He exhibits no edema.  Neurological: He is alert and oriented to person, place, and time.    Mallampati Score:     Imaging: Dg Bone Survey Met  03/17/2015  CLINICAL DATA:  Multiple myeloma with generalized pain EXAM: METASTATIC BONE SURVEY COMPARISON:  None. FINDINGS: There is generalized osteoporosis.  Lateral skull: No blastic or lytic bone lesions. No air-fluid levels. Cervical spine: There is generalized osteoarthritic change. No fracture or spondylolisthesis. There are no blastic or lytic bone lesions. Thoracic spine: No fracture or spondylolisthesis. No well-defined blastic or lytic bone lesions apparent. Lumbar spine: There is osteoarthritic change. No focal blastic or lytic bone lesions identified. No fracture or spondylolisthesis apparent. Chest: The lungs are clear. Heart size and pulmonary vascularity are normal. Patient is status post aortic valve replacement. No well-defined blastic or lytic bone lesions are identified. Pelvis: There are no well-defined blastic or lytic bone lesions. No fracture or dislocation. There is moderate symmetric narrowing of both hip joints. Right femur: There are scattered small lucent lesions throughout the proximal to mid femur. There is also a subtle lucency in the lateral right femoral neck. These lesions are concerning for foci of multiple myeloma. No fracture or impending fracture. Left femur: There are several subtle lucent lesions concerning for multiple myeloma. No fracture or impending fracture. Right tibia and fibula: No blastic or lytic bone lesions. No fracture or dislocation. Left tibia and fibula: No fracture or dislocation. No blastic or lytic bone lesions. Right shoulder and humerus: There are multiple small lytic lesions throughout the proximal to mid humerus, consistent with multiple myeloma. There also small lytic lesions in the lateral right scapula. No fracture or impending fracture. Left shoulder and humerus: There are lytic lesions in the proximal and mid left humerus consistent with multiple myeloma. No fracture or impending fracture. Right forearm: There are several small lytic lesions in the proximal ulna consistent with multiple myeloma. No fracture or impending fracture. Left forearm: Several small lytic lesions are noted in the proximal left  ulna, felt to be consistent with multiple myeloma. No fracture or impending fracture. IMPRESSION: Multiple lytic lesions are noted in the extremities, consistent with multiple myeloma. Note that there is generalized osteoporosis. Multiple myeloma may present as osteoporosis ; this study may underestimate the extent of multiple myeloma change.  No fractures or impending fractures are identified. There is osteoarthritic change at multiple sites in the spine as well as moderate narrowing of both hip joints. Lungs are clear. Electronically Signed   By: Lowella Grip III M.D.   On: 03/17/2015 19:01    Labs:  CBC:  Recent Labs  03/17/15 1058 03/18/15 0532  WBC 5.2 4.4  HGB 7.3* 8.2*  HCT 22.7* 24.8*  PLT 146* 160    COAGS:  Recent Labs  03/17/15 1902  INR 1.03    BMP:  Recent Labs  03/17/15 1058 03/18/15 0532  NA 133* 136  K 4.0 3.8  CL 102 100*  CO2 25 28  GLUCOSE 175* 121*  BUN 11 7  CALCIUM 9.8 10.2  CREATININE 0.69 0.65  GFRNONAA >60 >60  GFRAA >60 >60    LIVER FUNCTION TESTS:  Recent Labs  02/12/15 0829 03/17/15 1901 03/18/15 0532  BILITOT 0.4 1.6* 0.8  AST 28 32 35  ALT 10 14* 6*  ALKPHOS 75 65 61  PROT 7.9 8.1 7.5  ALBUMIN 3.6 3.4* 3.1*    TUMOR MARKERS: No results for input(s): AFPTM, CEA, CA199, CHROMGRNA in the last 8760 hours.  Assessment and Plan: 70 y.o. male with PMH significant for hypertension, GERD, obstructive sleep apnea, irritable bowel syndrome, diabetes, aortic stenosis status post aortic valve replacement with bioprosthetic valve 2006/2013 and restless leg syndrome. 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 Bone survey has demonstrated multiple lytic lesions in the extremities consistent with multiple myeloma. He presents today for CT-guided bone marrow biopsy for further evaluation.Risks and Benefits discussed with the patient including, but not limited to  bleeding, infection, damage to adjacent structures or low yield requiring additional tests.All of the patient's questions were answered, patient is agreeable to proceed.Consent signed and in chart.       Thank you for this interesting consult.  I greatly enjoyed meeting PATTY LOPEZGARCIA and look forward to participating in their care.  A copy of this report was sent to the requesting provider on this date.  Electronically Signed: D. Rowe Robert 03/30/2015, 8:35 AM   I spent a total of 15 minutes in clinical consultation, greater than 50% of which was counseling/coordinating care for CT guided bone marrow biopsy

## 2015-04-01 ENCOUNTER — Ambulatory Visit (HOSPITAL_COMMUNITY): Payer: Medicare Other

## 2015-04-02 ENCOUNTER — Telehealth: Payer: Self-pay | Admitting: *Deleted

## 2015-04-02 NOTE — Telephone Encounter (Signed)
VM message from Blanchard Kelch @ [place] requesting results of pt's bone marrow biopsy done 03/29/14.  TC back to anita. Results are not available at this time.  [name] requesting results be fax'd to her attention when available @ [phone]

## 2015-04-02 NOTE — Telephone Encounter (Signed)
Tiffany,  This pt has not seen by us before, looks he has MM. Have you received a referral yet? His bone marrow biopsy result is signed off. Please look into it to see if he needs to be seen here.   ,   04/02/2015  

## 2015-04-03 ENCOUNTER — Telehealth: Payer: Self-pay

## 2015-04-03 NOTE — Telephone Encounter (Signed)
Blanchard Kelch from Dr. Neva Seat office is calling from the [facility] hospital looking for the final results of patient's bone marrow biopsy.  Results are not available at this time.

## 2015-04-07 ENCOUNTER — Telehealth: Payer: Self-pay | Admitting: *Deleted

## 2015-04-07 NOTE — Telephone Encounter (Signed)
Second call from [place] with [employer].  This nurse noted patient has not been seen at this office.  Biopsy under sedation at [facility] since [place] doesn't use sedation.  The Bone Marrow Bx performed 03-30-2015  at [facility].  Provided Medical Day number [phone] for [facility] to call to determine fax number [facility] will have to fax signed release. Their release request must be faxed to [facility] Due to procedure being performed in the hospital.

## 2015-04-07 NOTE — Telephone Encounter (Signed)
"  This is [name] with [employer] in [place].  We need a copy of the Bone Marrow Biopsy results sent to [place].  Could you fax these to [phone], Attn Dr. Jiles Crocker."  Informed [name] the complete results are pending.  Will send when received.  [name] can be reached at [phone] extension [phone].

## 2015-04-08 LAB — CHROMOSOME ANALYSIS, BONE MARROW

## 2015-04-20 ENCOUNTER — Encounter (HOSPITAL_COMMUNITY): Payer: Self-pay

## 2015-07-16 ENCOUNTER — Observation Stay (HOSPITAL_COMMUNITY): Payer: Non-veteran care

## 2015-07-16 ENCOUNTER — Encounter (HOSPITAL_COMMUNITY): Payer: Self-pay | Admitting: *Deleted

## 2015-07-16 ENCOUNTER — Observation Stay (HOSPITAL_COMMUNITY)
Admission: EM | Admit: 2015-07-16 | Discharge: 2015-07-16 | Disposition: A | Discharge status: Home / Self Care | Payer: Non-veteran care | Attending: Internal Medicine | Admitting: Internal Medicine

## 2015-07-16 DIAGNOSIS — D509 Iron deficiency anemia, unspecified: Secondary | ICD-10-CM | POA: Diagnosis not present

## 2015-07-16 DIAGNOSIS — R05 Cough: Secondary | ICD-10-CM

## 2015-07-16 DIAGNOSIS — K648 Other hemorrhoids: Secondary | ICD-10-CM | POA: Insufficient documentation

## 2015-07-16 DIAGNOSIS — K922 Gastrointestinal hemorrhage, unspecified: Secondary | ICD-10-CM | POA: Diagnosis not present

## 2015-07-16 DIAGNOSIS — Z954 Presence of other heart-valve replacement: Secondary | ICD-10-CM

## 2015-07-16 DIAGNOSIS — J45909 Unspecified asthma, uncomplicated: Secondary | ICD-10-CM | POA: Diagnosis not present

## 2015-07-16 DIAGNOSIS — N451 Epididymitis: Secondary | ICD-10-CM | POA: Insufficient documentation

## 2015-07-16 DIAGNOSIS — F329 Major depressive disorder, single episode, unspecified: Secondary | ICD-10-CM | POA: Insufficient documentation

## 2015-07-16 DIAGNOSIS — K219 Gastro-esophageal reflux disease without esophagitis: Secondary | ICD-10-CM

## 2015-07-16 DIAGNOSIS — R519 Headache, unspecified: Secondary | ICD-10-CM

## 2015-07-16 DIAGNOSIS — Z9841 Cataract extraction status, right eye: Secondary | ICD-10-CM | POA: Insufficient documentation

## 2015-07-16 DIAGNOSIS — R054 Cough syncope: Secondary | ICD-10-CM

## 2015-07-16 DIAGNOSIS — D649 Anemia, unspecified: Secondary | ICD-10-CM | POA: Diagnosis present

## 2015-07-16 DIAGNOSIS — D6481 Anemia due to antineoplastic chemotherapy: Secondary | ICD-10-CM | POA: Insufficient documentation

## 2015-07-16 DIAGNOSIS — R51 Headache: Secondary | ICD-10-CM

## 2015-07-16 DIAGNOSIS — K6289 Other specified diseases of anus and rectum: Secondary | ICD-10-CM | POA: Diagnosis not present

## 2015-07-16 DIAGNOSIS — K644 Residual hemorrhoidal skin tags: Secondary | ICD-10-CM | POA: Diagnosis not present

## 2015-07-16 DIAGNOSIS — I350 Nonrheumatic aortic (valve) stenosis: Secondary | ICD-10-CM

## 2015-07-16 DIAGNOSIS — C9000 Multiple myeloma not having achieved remission: Secondary | ICD-10-CM | POA: Diagnosis not present

## 2015-07-16 DIAGNOSIS — I10 Essential (primary) hypertension: Secondary | ICD-10-CM

## 2015-07-16 DIAGNOSIS — G4733 Obstructive sleep apnea (adult) (pediatric): Secondary | ICD-10-CM | POA: Insufficient documentation

## 2015-07-16 DIAGNOSIS — G4700 Insomnia, unspecified: Secondary | ICD-10-CM

## 2015-07-16 DIAGNOSIS — M1990 Unspecified osteoarthritis, unspecified site: Secondary | ICD-10-CM | POA: Diagnosis not present

## 2015-07-16 DIAGNOSIS — Z8249 Family history of ischemic heart disease and other diseases of the circulatory system: Secondary | ICD-10-CM | POA: Insufficient documentation

## 2015-07-16 DIAGNOSIS — G2581 Restless legs syndrome: Secondary | ICD-10-CM | POA: Diagnosis not present

## 2015-07-16 DIAGNOSIS — K602 Anal fissure, unspecified: Secondary | ICD-10-CM | POA: Diagnosis not present

## 2015-07-16 DIAGNOSIS — E0590 Thyrotoxicosis, unspecified without thyrotoxic crisis or storm: Secondary | ICD-10-CM | POA: Diagnosis not present

## 2015-07-16 DIAGNOSIS — K589 Irritable bowel syndrome without diarrhea: Secondary | ICD-10-CM | POA: Diagnosis not present

## 2015-07-16 DIAGNOSIS — D62 Acute posthemorrhagic anemia: Secondary | ICD-10-CM

## 2015-07-16 DIAGNOSIS — M109 Gout, unspecified: Secondary | ICD-10-CM | POA: Diagnosis not present

## 2015-07-16 DIAGNOSIS — K625 Hemorrhage of anus and rectum: Secondary | ICD-10-CM | POA: Diagnosis not present

## 2015-07-16 DIAGNOSIS — D72819 Decreased white blood cell count, unspecified: Secondary | ICD-10-CM | POA: Diagnosis not present

## 2015-07-16 DIAGNOSIS — Z9889 Other specified postprocedural states: Secondary | ICD-10-CM | POA: Insufficient documentation

## 2015-07-16 DIAGNOSIS — Z7984 Long term (current) use of oral hypoglycemic drugs: Secondary | ICD-10-CM | POA: Insufficient documentation

## 2015-07-16 DIAGNOSIS — N400 Enlarged prostate without lower urinary tract symptoms: Secondary | ICD-10-CM

## 2015-07-16 DIAGNOSIS — E785 Hyperlipidemia, unspecified: Secondary | ICD-10-CM | POA: Insufficient documentation

## 2015-07-16 DIAGNOSIS — R55 Syncope and collapse: Secondary | ICD-10-CM

## 2015-07-16 DIAGNOSIS — Z888 Allergy status to other drugs, medicaments and biological substances status: Secondary | ICD-10-CM | POA: Insufficient documentation

## 2015-07-16 DIAGNOSIS — Z87891 Personal history of nicotine dependence: Secondary | ICD-10-CM | POA: Insufficient documentation

## 2015-07-16 DIAGNOSIS — Z9842 Cataract extraction status, left eye: Secondary | ICD-10-CM | POA: Insufficient documentation

## 2015-07-16 DIAGNOSIS — K649 Unspecified hemorrhoids: Secondary | ICD-10-CM

## 2015-07-16 DIAGNOSIS — G20 Parkinson's disease: Secondary | ICD-10-CM | POA: Insufficient documentation

## 2015-07-16 DIAGNOSIS — R0989 Other specified symptoms and signs involving the circulatory and respiratory systems: Secondary | ICD-10-CM

## 2015-07-16 DIAGNOSIS — Z953 Presence of xenogenic heart valve: Secondary | ICD-10-CM

## 2015-07-16 DIAGNOSIS — Z8261 Family history of arthritis: Secondary | ICD-10-CM | POA: Insufficient documentation

## 2015-07-16 DIAGNOSIS — Z952 Presence of prosthetic heart valve: Secondary | ICD-10-CM | POA: Insufficient documentation

## 2015-07-16 DIAGNOSIS — E119 Type 2 diabetes mellitus without complications: Secondary | ICD-10-CM | POA: Insufficient documentation

## 2015-07-16 DIAGNOSIS — Z79899 Other long term (current) drug therapy: Secondary | ICD-10-CM | POA: Insufficient documentation

## 2015-07-16 DIAGNOSIS — Z7982 Long term (current) use of aspirin: Secondary | ICD-10-CM | POA: Insufficient documentation

## 2015-07-16 HISTORY — DX: Gastrointestinal hemorrhage, unspecified: K92.2

## 2015-07-16 HISTORY — DX: Malignant (primary) neoplasm, unspecified: C80.1

## 2015-07-16 LAB — COMPREHENSIVE METABOLIC PANEL
ALBUMIN: 3.2 g/dL — AB (ref 3.5–5.0)
ALK PHOS: 96 U/L (ref 38–126)
AST: 24 U/L (ref 15–41)
Anion gap: 5 (ref 5–15)
BILIRUBIN TOTAL: 0.6 mg/dL (ref 0.3–1.2)
BUN: 10 mg/dL (ref 6–20)
CALCIUM: 10.1 mg/dL (ref 8.9–10.3)
CO2: 27 mmol/L (ref 22–32)
CREATININE: 0.52 mg/dL — AB (ref 0.61–1.24)
Chloride: 99 mmol/L — ABNORMAL LOW (ref 101–111)
GFR calc Af Amer: >60 mL/min (ref >60)
GFR calc non Af Amer: >60 mL/min (ref >60)
GLUCOSE: 165 mg/dL — AB (ref 65–99)
Potassium: 3.8 mmol/L (ref 3.5–5.1)
SODIUM: 131 mmol/L — AB (ref 135–145)
TOTAL PROTEIN: 7.8 g/dL (ref 6.5–8.1)

## 2015-07-16 LAB — URINALYSIS, ROUTINE W REFLEX MICROSCOPIC
Bilirubin Urine: NEGATIVE
GLUCOSE, UA: NEGATIVE mg/dL
HGB URINE DIPSTICK: NEGATIVE
Ketones, ur: NEGATIVE mg/dL
LEUKOCYTES UA: NEGATIVE
Nitrite: NEGATIVE
PH: 7.5 (ref 5.0–8.0)
PROTEIN: NEGATIVE mg/dL
SPECIFIC GRAVITY, URINE: 1.012 (ref 1.005–1.030)

## 2015-07-16 LAB — PROTIME-INR
INR: 0.96 (ref 0.00–1.49)
PROTHROMBIN TIME: 13 s (ref 11.6–15.2)

## 2015-07-16 LAB — CBC
HCT: 23.2 % — ABNORMAL LOW (ref 39.0–52.0)
Hemoglobin: 6.6 g/dL — CL (ref 13.0–17.0)
MCH: 22.4 pg — AB (ref 26.0–34.0)
MCHC: 28.4 g/dL — AB (ref 30.0–36.0)
MCV: 78.6 fL (ref 78.0–100.0)
Platelets: 194 10*3/uL (ref 150–400)
RBC: 2.95 MIL/uL — ABNORMAL LOW (ref 4.22–5.81)
RDW: 19.6 % — AB (ref 11.5–15.5)
WBC: 3.7 10*3/uL — ABNORMAL LOW (ref 4.0–10.5)

## 2015-07-16 LAB — GLUCOSE, CAPILLARY
GLUCOSE-CAPILLARY: 171 mg/dL — AB (ref 65–99)
GLUCOSE-CAPILLARY: 104 mg/dL — AB (ref 65–99)
Glucose-Capillary: 154 mg/dL — ABNORMAL HIGH (ref 65–99)

## 2015-07-16 LAB — PREPARE RBC (CROSSMATCH)

## 2015-07-16 LAB — HEMOGLOBIN AND HEMATOCRIT, BLOOD
HCT: 21.6 % — ABNORMAL LOW (ref 39.0–52.0)
HEMATOCRIT: 25.1 % — AB (ref 39.0–52.0)
Hemoglobin: 6.2 g/dL — CL (ref 13.0–17.0)
Hemoglobin: 7.1 g/dL — ABNORMAL LOW (ref 13.0–17.0)

## 2015-07-16 LAB — POC OCCULT BLOOD, ED: FECAL OCCULT BLD: POSITIVE — AB

## 2015-07-16 MED ORDER — AMLODIPINE BESYLATE 5 MG PO TABS
5.0000 mg | ORAL_TABLET | Freq: Every day | ORAL | Status: DC
  Filled 2015-07-16: qty 1

## 2015-07-16 MED ORDER — POLYETHYLENE GLYCOL 3350 17 G PO PACK: 17.0000 g | PACK | Freq: Every day | ORAL | Status: DC

## 2015-07-16 MED ORDER — VITAMIN D 1000 UNITS PO TABS
1000.0000 [IU] | ORAL_TABLET | Freq: Every day | ORAL | Status: DC
  Administered 2015-07-16: 1000 [IU] via ORAL
  Filled 2015-07-16: qty 1

## 2015-07-16 MED ORDER — ONDANSETRON HCL 4 MG/2ML IJ SOLN: 4.0000 mg | Freq: Four times a day (QID) | INTRAMUSCULAR | Status: DC | PRN

## 2015-07-16 MED ORDER — SODIUM CHLORIDE 0.9 % IV SOLN
Freq: Once | INTRAVENOUS | Status: AC
  Administered 2015-07-16: 07:00:00 via INTRAVENOUS

## 2015-07-16 MED ORDER — CYANOCOBALAMIN 500 MCG PO TABS
500.0000 ug | ORAL_TABLET | Freq: Two times a day (BID) | ORAL | Status: DC
  Administered 2015-07-16: 500 ug via ORAL
  Filled 2015-07-16 (×2): qty 1

## 2015-07-16 MED ORDER — FERROUS SULFATE 325 (65 FE) MG PO TABS: 325.0000 mg | ORAL_TABLET | Freq: Every day | ORAL | Status: DC

## 2015-07-16 MED ORDER — CARBIDOPA-LEVODOPA 25-100 MG PO TABS
3.0000 | ORAL_TABLET | Freq: Four times a day (QID) | ORAL | Status: DC
  Administered 2015-07-16 (×2): 3 via ORAL
  Filled 2015-07-16 (×2): qty 3

## 2015-07-16 MED ORDER — VITAMIN D 50 MCG (2000 UT) PO CAPS: 1.0000 | ORAL_CAPSULE | Freq: Every day | ORAL | Status: DC

## 2015-07-16 MED ORDER — SODIUM CHLORIDE 0.9% FLUSH
3.0000 mL | Freq: Two times a day (BID) | INTRAVENOUS | Status: DC
  Administered 2015-07-16: 3 mL via INTRAVENOUS

## 2015-07-16 MED ORDER — PANTOPRAZOLE SODIUM 40 MG PO TBEC
40.0000 mg | DELAYED_RELEASE_TABLET | Freq: Every day | ORAL | Status: DC
  Administered 2015-07-16: 40 mg via ORAL
  Filled 2015-07-16: qty 1

## 2015-07-16 MED ORDER — POLYVINYL ALCOHOL 1.4 % OP SOLN: 1.0000 [drp] | Freq: Three times a day (TID) | OPHTHALMIC | Status: DC | PRN

## 2015-07-16 MED ORDER — PANTOPRAZOLE SODIUM 40 MG PO TBEC: 40.0000 mg | DELAYED_RELEASE_TABLET | Freq: Every day | ORAL | Status: DC

## 2015-07-16 MED ORDER — ZOLPIDEM TARTRATE 5 MG PO TABS: 5.0000 mg | ORAL_TABLET | Freq: Every evening | ORAL | Status: DC | PRN

## 2015-07-16 MED ORDER — AMLODIPINE BESYLATE 5 MG PO TABS: 5.0000 mg | ORAL_TABLET | Freq: Every day | ORAL | Status: DC

## 2015-07-16 MED ORDER — ONDANSETRON HCL 4 MG PO TABS: 4.0000 mg | ORAL_TABLET | Freq: Four times a day (QID) | ORAL | Status: DC | PRN

## 2015-07-16 MED ORDER — METOPROLOL TARTRATE 50 MG PO TABS: 25.0000 mg | ORAL_TABLET | Freq: Two times a day (BID) | ORAL | Status: DC

## 2015-07-16 MED ORDER — PRAVASTATIN SODIUM 40 MG PO TABS: 40.0000 mg | ORAL_TABLET | Freq: Every evening | ORAL | Status: DC

## 2015-07-16 MED ORDER — GABAPENTIN 400 MG PO CAPS
800.0000 mg | ORAL_CAPSULE | Freq: Three times a day (TID) | ORAL | Status: DC
  Administered 2015-07-16 (×2): 800 mg via ORAL
  Filled 2015-07-16 (×2): qty 2

## 2015-07-16 MED ORDER — INSULIN ASPART 100 UNIT/ML ~~LOC~~ SOLN
0.0000 [IU] | Freq: Three times a day (TID) | SUBCUTANEOUS | Status: DC
  Administered 2015-07-16 (×2): 2 [IU] via SUBCUTANEOUS

## 2015-07-16 MED ORDER — METOPROLOL TARTRATE 25 MG PO TABS
25.0000 mg | ORAL_TABLET | Freq: Two times a day (BID) | ORAL | Status: DC
  Filled 2015-07-16: qty 1

## 2015-07-16 MED ORDER — HYPROMELLOSE (GONIOSCOPIC) 2.5 % OP SOLN: 1.0000 [drp] | Freq: Every evening | OPHTHALMIC | Status: DC | PRN

## 2015-07-16 MED ORDER — FINASTERIDE 5 MG PO TABS
5.0000 mg | ORAL_TABLET | Freq: Every day | ORAL | Status: DC
  Administered 2015-07-16: 5 mg via ORAL
  Filled 2015-07-16: qty 1

## 2015-07-16 MED ORDER — SODIUM CHLORIDE 0.9 % IV SOLN
INTRAVENOUS | Status: DC
  Administered 2015-07-16: 09:00:00 via INTRAVENOUS

## 2015-07-16 MED ORDER — ALBUTEROL SULFATE (2.5 MG/3ML) 0.083% IN NEBU: 2.5000 mg | INHALATION_SOLUTION | RESPIRATORY_TRACT | Status: DC | PRN

## 2015-07-16 MED ORDER — SODIUM CHLORIDE 0.9 % IV SOLN: 10.0000 mL/h | Freq: Once | INTRAVENOUS | Status: DC

## 2015-07-16 NOTE — ED Provider Notes (Signed)
CSN: [id number]     Arrival date & time 07/16/15  0256 History  By signing my name below, I, Emmanuella Mensah, attest that this documentation has been prepared under the direction and in the presence of Sharlett Iles, MD. Electronically Signed: Judithann Sauger, ED Scribe. 07/16/2015. 4:06 AM.     Chief Complaint  Patient presents with  . Rectal Bleeding   The history is provided by the patient. No language interpreter was used.   HPI Comments: [name] is a 70 y.o. male with a hx of HTN, DM, and Multiple Myeloma on chemo who presents to the Emergency Department complaining of one episode of rectal bleeding. He went to the restroom to urinate around 2 am and afterwards noticed some blood in his urine. When he sat down to put his slippers back on, he noticed his underwear was soaked with blood. He wiped and noted more BRB. He reports associated weakness. He has had scrotal pain for the past few weeks and is on his 2nd course of cipro for scrotal swelling; he has been evaluated for this problem including scrotal US. Pt denies any anticoagulants use. No alleviating factors noted. He currently has chemo once a week.  He denies a hx of GI bleeds but states that he has a hx of external hemorrhoids. He denies any abdominal pain or n/v. He does note rectal pain when he has to strain for a BM. Pt received one unit of blood one week ago and has had multiple blood transfusions in the past.     Past Medical History  Diagnosis Date  . HTN (hypertension)   . GERD (gastroesophageal reflux disease)     bravo pH study 2008  . Hyperlipidemia   . Hemorrhoids     external and internal  . Insomnia   . Allergic rhinitis   . OA (osteoarthritis)   . Hiatal hernia   . BPH (benign prostatic hypertrophy)   . Chronic cough   . Anemia 2009  . Diverticulosis of colon     on colonoscopy 2008  . OSA (obstructive sleep apnea)     USES CPAP AS NEEDED  . Headache(784.0)   . IBS (irritable bowel  syndrome)   . Periodontitis     chronic with bone loss  . Gout   . Asthma   . Depression     pt denies  . Cataract   . Chronic interstitial cystitis   . Aortic stenosis     a. s/p porcine AVR 2006;  b. s/p redo tissue AVR 12/2011 (Dr. Roxy Manns) - preAVR LHC with no CAD  . H/O aortic valve replacement with porcine valve     2006, 2013  . Diabetes mellitus     type 2  . S/P aortic valve replacement with bioprosthetic valve 12/06/2011    Redo AVR using 23 mm [facility] Ease pericardial tissue valve  . Hx of echocardiogram     a. Echo  (post AVR) 12/2011:  mod LVH, EF 60-65%, Gr 2 diast dysfn, mild AI, AVR ok (mean gradient 19 mmHg), MAC, mild BAE  . Diabetes mellitus without complication ([place])   . Hypertension   . Hyperthyroidism   . Restless leg syndrome    Past Surgical History  Procedure Laterality Date  . Aortic valve replacement  10/13/2004    28m [place] pericardial tissue valve  . Nasal septoplasty w/ turbinoplasty    . Right knee arthroscopy    . Bunionectomy  right  . Cataract extraction  2009    &   2012    BILATERAL  . Esophagogastroduodenoscopy  08/30/2011    Procedure: ESOPHAGOGASTRODUODENOSCOPY (EGD);  Surgeon: Inda Castle, MD;  Location: [place];  Service: Endoscopy;  Laterality: N/A;  Rm 3005   . Colonoscopy  08/31/2011    Procedure: COLONOSCOPY;  Surgeon: Inda Castle, MD;  Location: [place];  Service: Endoscopy;  Laterality: N/A;  . Refractive surgery      bilateral  . Aortic valve replacement  12/06/2011    Procedure: REDO AORTIC VALVE REPLACEMENT (AVR);  Surgeon: Rexene Alberts, MD;  Location: [place];  Service: Open Heart Surgery;  Laterality: N/A;  . Left heart catheterization with coronary angiogram N/A 11/30/2011    Procedure: LEFT HEART CATHETERIZATION WITH CORONARY ANGIOGRAM;  Surgeon: Burnell Blanks, MD;  Location: [facility] CATH LAB;  Service: Cardiovascular;  Laterality: N/A;  . Right heart catheterization Bilateral  11/30/2011    Procedure: RIGHT HEART CATH;  Surgeon: Burnell Blanks, MD;  Location: [facility] CATH LAB;  Service: Cardiovascular;  Laterality: Bilateral;  . Aorta - femoral artery bypass graft    . Hernia repair    . Joint replacement      knee  . Cardiac surgery      aorta vavle replacement   Family History  Problem Relation Age of Onset  . Heart disease Father   . Osteoarthritis Mother   . Hypertension Sister   . Hyperlipidemia     Social History  Substance Use Topics  . Smoking status: Former Smoker    Quit date: 09/30/1971  . Smokeless tobacco: Never Used  . Alcohol Use: No    Review of Systems  A complete 10 system review of systems was obtained and all systems are negative except as noted in the HPI and PMH.     Allergies  Spironolactone; Avodart; Avodart; Codeine; Diazepam; Diazepam; Doxazosin; Doxazosin; Feraheme; and Spironolactone  Home Medications   Prior to Admission medications   Medication Sig Start Date End Date Taking? Authorizing Provider  amLODipine (NORVASC) 5 MG tablet Take 1 tablet (5 mg total) by mouth daily. 01/19/15   Lelon Perla, MD  aspirin 81 MG tablet Take 81 mg by mouth daily.    Historical Provider, MD  carbidopa-levodopa (SINEMET IR) 25-100 MG tablet Take 3 tablets by mouth 4 (four) times daily.     Historical Provider, MD  carboxymethylcellulose 1 % ophthalmic solution Apply 1 drop to eye 3 (three) times daily as needed (dry eyes).    Historical Provider, MD  Cholecalciferol (VITAMIN D) 2000 UNITS CAPS Take 1 capsule by mouth daily.    Historical Provider, MD  cyanocobalamin 500 MCG tablet Take 500 mcg by mouth 2 (two) times daily.    Historical Provider, MD  docusate sodium (COLACE) 100 MG capsule Take 100 mg by mouth 2 (two) times daily.    Historical Provider, MD  ferrous sulfate 325 (65 FE) MG tablet Take 325 mg by mouth at bedtime.    Historical Provider, MD  finasteride (PROSCAR) 5 MG tablet Take 5 mg by mouth daily.     Historical Provider, MD  fish oil-omega-3 fatty acids 1000 MG capsule Take 1 g by mouth 2 (two) times daily.     Historical Provider, MD  gabapentin (NEURONTIN) 800 MG tablet Take 800 mg by mouth 3 (three) times daily.    Historical Provider, MD  hydrochlorothiazide (MICROZIDE) 12.5 MG capsule TAKE 1 CAPSULE (12.5 MG TOTAL) BY  MOUTH DAILY. 03/10/15   Lelon Perla, MD  losartan (COZAAR) 100 MG tablet Take 100 mg by mouth at bedtime. 01/20/12   Lelon Perla, MD  metFORMIN (GLUCOPHAGE) 500 MG tablet Take 500 mg by mouth 2 (two) times daily with a meal.    Historical Provider, MD  methylcellulose (ARTIFICIAL TEARS) 1 % ophthalmic solution Place 1 drop into both eyes at bedtime as needed (dry eyes).    Historical Provider, MD  metoprolol (LOPRESSOR) 50 MG tablet Take 0.5 tablets (25 mg total) by mouth 2 (two) times daily. 03/18/15   Jule Ser, DO  pantoprazole (PROTONIX) 40 MG tablet Take 40 mg by mouth daily as needed. For GERD    Historical Provider, MD  pravastatin (PRAVACHOL) 40 MG tablet Take 1 tablet (40 mg total) by mouth every evening. 01/19/15   Lelon Perla, MD  zolpidem (AMBIEN) 10 MG tablet Take 5 mg by mouth at bedtime as needed for sleep.    Historical Provider, MD   BP 168/88 mmHg  Pulse 95  Temp(Src) 97.6 F (36.4 C) (Oral)  Resp 18  Ht _0  (1.753 m)  Wt 211 lb (95.709 kg)  BMI 31.15 kg/m2  SpO2 100% Physical Exam  Constitutional: He is oriented to person, place, and time. He appears well-developed and well-nourished. No distress.  HENT:  Head: Normocephalic and atraumatic.  Moist mucous membranes  Eyes: Conjunctivae are normal. Pupils are equal, round, and reactive to light.  Neck: Neck supple.  Cardiovascular: Normal rate and regular rhythm.   Murmur heard.  Systolic murmur is present with a grade of 5/6  Pulmonary/Chest: Effort normal and breath sounds normal.  Abdominal: Soft. Bowel sounds are normal. He exhibits no distension. There is no tenderness.   Genitourinary: Penis normal.  Multiple non-thrombosed external hemorrhoids, no active bleeding; no stool in rectal vault; no obvious scrotal swelling or erythema  Musculoskeletal: He exhibits no edema.  Neurological: He is alert and oriented to person, place, and time.  Fluent speech  Skin: Skin is warm and dry. No rash noted. There is pallor.  Psychiatric: He has a normal mood and affect. Judgment normal.  Nursing note and vitals reviewed. Chaperone was present during exam.   ED Course  .Critical Care Performed by: Sharlett Iles Authorized by: Sharlett Iles Total critical care time: 30 minutes Critical care was necessary to treat or prevent imminent or life-threatening deterioration of the following conditions: circulatory failure. Critical care was time spent personally by me on the following activities: development of treatment plan with patient or surrogate, examination of patient, obtaining history from patient or surrogate, ordering and performing treatments and interventions, ordering and review of laboratory studies, ordering and review of radiographic studies, re-evaluation of patient's condition and review of old charts.   (including critical care time) DIAGNOSTIC STUDIES: Oxygen Saturation is 100% on RA, normal by my interpretation.    COORDINATION OF CARE: 4:05 AM- Pt advised of plan for treatment and pt agrees. Pt will receive lab work and hemoccult for further evaluation.   Labs Review Labs Reviewed  COMPREHENSIVE METABOLIC PANEL - Abnormal; Notable for the following:    Sodium 131 (*)    Chloride 99 (*)    Glucose, Bld 165 (*)    Creatinine, Ser 0.52 (*)    Albumin 3.2 (*)    ALT <5 (*)    All other components within normal limits  CBC - Abnormal; Notable for the following:    WBC 3.7 (*)  RBC 2.95 (*)    Hemoglobin 6.6 (*)    HCT 23.2 (*)    MCH 22.4 (*)    MCHC 28.4 (*)    RDW 19.6 (*)    All other components within normal limits  POC  OCCULT BLOOD, ED - Abnormal; Notable for the following:    Fecal Occult Bld POSITIVE (*)    All other components within normal limits  PROTIME-INR  TYPE AND SCREEN    Imaging Review No results found.   Sharlett Iles, MD has personally reviewed and evaluated these lab results as part of her medical decision-making.   EKG Interpretation None      MDM   Final diagnoses:  Anemia, unspecified anemia type  Rectal bleeding   Patient with multiple myeloma on chemotherapy with history of multiple blood transfusions in the past presents with an episode of bright red blood per rectum noted tonight. On exam, he was awake and alert, comfortable and in no acute distress. Vital signs notable for mild hypertension. He had external hemorrhoids with no active bleeding on rectal exam. Labs show WBC 3.7, Hgb 6.6, hemoccult positive. I am concerned about report of bleeding in setting of anemia. Although he has hx of anemia related to chemo and MM, I can't rule out significant GI bleed and pt will require transfusion. I have ordered 1u irradiated pRBCs. Discussed admission with Dr. Tamala Julian and pt admitted for further care.   I personally performed the services described in this documentation, which was scribed in my presence. The recorded information has been reviewed and is accurate.   Sharlett Iles, MD 07/17/15 [phone]

## 2015-07-16 NOTE — Discharge Summary (Signed)
Physician Discharge Summary   Patient ID: [name] MRN: [id number] DOB/AGE: 11-May-1945 70 y.o.  Admit date: 07/16/2015 Discharge date: 07/16/2015  Primary Care Physician:  Buzzy Han, MD  Discharge Diagnoses:    . Anemia . GI bleed . Iron deficiency anemia, unspecified . GERD . Essential hypertension . BPH (benign prostatic hyperplasia) . Acute blood loss anemia . Hemorrhoids  Consults: GI, Dr Carlean Purl  Recommendations for Outpatient Follow-up:   1. Please repeat CBC/BMET at next visit 2. Treat anal fissure with diltiazem cream 2% tid # 30 g - GI called it in 3 refills   DIET: carb modified     Allergies:   Allergies  Allergen Reactions  . Spironolactone Other (See Comments)    Low tolerance   . Avodart [Dutasteride] Other (See Comments)    Unable to feel or use hands/legs  . Avodart [Dutasteride] Other (See Comments)    Lose of use of arms and legs   . Codeine Other (See Comments)    GI upset in large doses  . Diazepam   . Diazepam Other (See Comments)    Mood changes   . Doxazosin   . Doxazosin Other (See Comments)    Dizziness   . Feraheme [Ferumoxytol] Swelling    Pedal edema  . Spironolactone      DISCHARGE MEDICATIONS: Current Discharge Medication List    START taking these medications   Details  polyethylene glycol (MIRALAX) packet Take 17 g by mouth daily. Qty: 30 each, Refills: 3      CONTINUE these medications which have CHANGED   Details  amLODipine (NORVASC) 5 MG tablet Take 1 tablet (5 mg total) by mouth daily. Qty: 30 tablet, Refills: 11    Cholecalciferol (VITAMIN D) 2000 units CAPS Take 1 capsule (2,000 Units total) by mouth daily. Reported on 07/16/2015 Qty: 30 capsule, Refills: 3    ferrous sulfate 325 (65 FE) MG tablet Take 1 tablet (325 mg total) by mouth at bedtime. Reported on 07/16/2015 Qty: 30 tablet, Refills: 3    metoprolol (LOPRESSOR) 50 MG tablet Take 0.5 tablets (25 mg total) by mouth 2 (two) times  daily. Qty: 60 tablet, Refills: 4   Associated Diagnoses: Essential hypertension, benign    pantoprazole (PROTONIX) 40 MG tablet Take 1 tablet (40 mg total) by mouth daily. For GERD Qty: 30 tablet, Refills: 3      CONTINUE these medications which have NOT CHANGED   Details  carbidopa-levodopa (SINEMET IR) 25-100 MG tablet Take 3 tablets by mouth 4 (four) times daily.     carboxymethylcellulose 1 % ophthalmic solution Apply 1 drop to eye 3 (three) times daily as needed (dry eyes).    cyanocobalamin 500 MCG tablet Take 500 mcg by mouth 2 (two) times daily.    docusate sodium (COLACE) 100 MG capsule Take 100 mg by mouth 2 (two) times daily.    finasteride (PROSCAR) 5 MG tablet Take 5 mg by mouth daily.    fish oil-omega-3 fatty acids 1000 MG capsule Take 1 g by mouth daily.     gabapentin (NEURONTIN) 800 MG tablet Take 800 mg by mouth 3 (three) times daily.    metFORMIN (GLUCOPHAGE) 500 MG tablet Take 500 mg by mouth 2 (two) times daily with a meal.    methylcellulose (ARTIFICIAL TEARS) 1 % ophthalmic solution Place 1 drop into both eyes at bedtime as needed (dry eyes).    pravastatin (PRAVACHOL) 40 MG tablet Take 1 tablet (40 mg total) by mouth every evening. Qty: 30  tablet, Refills: 11    zolpidem (AMBIEN) 10 MG tablet Take 5 mg by mouth at bedtime as needed for sleep.      STOP taking these medications     hydrochlorothiazide (MICROZIDE) 12.5 MG capsule          Brief H and P: For complete details please refer to admission H and P, but in briefPatient is a 70 year old male with multiple myeloma on Velcade, HTN, hemorrhoids, BPH, aortic stent stenosis status post repair; presented with bloody bowel movements. Per patient he had blood transfusion last week for low counts. Patient woke up on the morning of admission around 2 AM with multiple bloody bowel movements. Patient receives most of his medical care at the [place], however had lost a sigmoidoscopy with [facility] in February  2017 showing hemorrhoids. Patient also reported that he had some scrotal pain for which he is being evaluated, possible epididymitis and had an ultrasound at the [facility] and is currently on his second round of ciprofloxacin. In ED, hemoglobin was 6.6, rectal exam by EDP showed large external hemorrhoids and Hemoccult positive stools. Patient was admitted for further workup.  Hospital Course:   GI bleed With history of hemorrhoids: FOBT positive - Hemoglobin 6.6, transfused 1 units of packed RBCs, continue PPI - GI consulted, patient was seen by Dr Carlean Purl. On exam, had skin tags, has recurrent hemorrhoidal bleeding with chronic anemia from multiple myeloma.  - GI recommended miralax daily and treat anal fissure with diltiazem cream, called in by GI  Acute blood loss anemia: Hemoglobin on admission 6.6.  - Transfused 1units packed RBCs, Hb 7.1 at discharge, patient declined 2nd unit transfusion. - continue PPI, ferrous sulfate  Diabetes mellitus type 2: CBGs 165 on admission - was placed on sliding scale insulin while inpatient. Continue metformin outpatient.  Essential hypertension - Continue losartan and metoprolol - Hold HCTZ for now   Multiple myeloma on Velcade: Patient due for chemotherapy at [facility]   Aortic stenosis s/p aortic valve replacement  Leukopenia:WBC 3.7 on admission. - Continue to monitor   Parkinson's disease - Continue Sinemet  BPH - Continue finasteride and Proscar  Hyperlipidemia - continue pravastatin  GERD - Continue Protonix  Day of Discharge BP 146/68 mmHg  Pulse 67  Temp(Src) 97.7 F (36.5 C) (Oral)  Resp 18  Ht _0  (1.753 m)  Wt 86.138 kg (189 lb 14.4 oz)  BMI 28.03 kg/m2  SpO2 100%  Physical Exam: General: Alert and awake oriented x3 not in any acute distress. HEENT: anicteric sclera, pupils reactive to light and accommodation CVS: S1-S2 clear no murmur rubs or gallops Chest: clear to auscultation bilaterally, no  wheezing rales or rhonchi Abdomen: soft nontender, nondistended, normal bowel sounds Extremities: no cyanosis, clubbing or edema noted bilaterally Neuro: Cranial nerves II-XII intact, no focal neurological deficits   The results of significant diagnostics from this hospitalization (including imaging, microbiology, ancillary and laboratory) are listed below for reference.    LAB RESULTS: Basic Metabolic Panel:  Recent Labs Lab 07/16/15 0310  NA 131*  K 3.8  CL 99*  CO2 27  GLUCOSE 165*  BUN 10  CREATININE 0.52*  CALCIUM 10.1   Liver Function Tests:  Recent Labs Lab 07/16/15 0310  AST 24  ALT <5*  ALKPHOS 96  BILITOT 0.6  PROT 7.8  ALBUMIN 3.2*   No results for input(s): LIPASE, AMYLASE in the last 168 hours. No results for input(s): AMMONIA in the last 168 hours. CBC:  Recent  Labs Lab 07/16/15 0310 07/16/15 0710 07/16/15 1431  WBC 3.7*  --   --   HGB 6.6* 6.2* 7.1*  HCT 23.2* 21.6* 25.1*  MCV 78.6  --   --   PLT 194  --   --    Cardiac Enzymes: No results for input(s): CKTOTAL, CKMB, CKMBINDEX, TROPONINI in the last 168 hours. BNP: Invalid input(s): POCBNP CBG:  Recent Labs Lab 07/16/15 0743 07/16/15 1205  GLUCAP 154* 171*    Significant Diagnostic Studies:  Dg Chest 2 View  07/16/2015  CLINICAL DATA:  Anemia.  Leukopenia. EXAM: CHEST  2 VIEW COMPARISON:  03/09/2013 FINDINGS: Chronic borderline cardiomegaly. Stable aortic tortuosity. The patient is status post median sternotomy for aortic valve replacement. Mild chronic elevation of the lateral right diaphragm. Low volume chest. There is no edema, consolidation, effusion, or pneumothorax. IMPRESSION: Stable.  No evidence of acute disease. Electronically Signed   By: Monte Fantasia M.D.   On: 07/16/2015 06:35    2D ECHO:   Disposition and Follow-up: Discharge Instructions    Diet Carb Modified    Complete by:  As directed      Increase activity slowly    Complete by:  As directed              DISPOSITION: home    DISCHARGE FOLLOW-UP Follow-up Information    Follow up with Buzzy Han, MD. Schedule an appointment as soon as possible for a visit in 10 days.   Specialty:  Family Medicine   Why:  for hospital follow-up   Contact information:   [address] [place] [address] [phone]       Follow up with Silvano Rusk, MD. Schedule an appointment as soon as possible for a visit in 4 weeks.   Specialty:  Gastroenterology   Why:  for hospital follow-up   Contact information:   [address] [place] [address] [phone]        Time spent on Discharge: 94mns   Signed:   Asheton Viramontes M.D. [facility] 07/16/2015, 4:35 PM Pager: [phone]

## 2015-07-16 NOTE — Progress Notes (Signed)
[facility]                                                                              Patient Demographics  [name], is a 70 y.o. male, DOB - Jun 08, 1945, [id number]  Admit date - 07/16/2015   Admitting Physician Norval Morton, MD  Outpatient Primary MD for the patient is Buzzy Han, MD  Outpatient specialists:   LOS -   days    Chief Complaint  Patient presents with  . Rectal Bleeding       Brief summary   Patient is a 70 year old male with multiple myeloma on Velcade, HTN, hemorrhoids, BPH, aortic stent stenosis status post repair; presented with bloody bowel movements. Per patient he had blood transfusion last week for low counts. Patient woke up on the morning of admission around 2 AM with multiple bloody bowel movements. Patient receives most of his medical care at the [place], however had lost a sigmoidoscopy with GI [facility] in February 2017 showing hemorrhoids. Patient also reported that he had some scrotal pain for which he is being evaluated, possible epididymitis and had an ultrasound at the [facility] and is currently on his second round of ciprofloxacin. In ED, hemoglobin was 6.6, rectal exam by EDP showed large external hemorrhoids and Hemoccult positive stools. Patient was admitted for further workup.   Assessment & Plan    Principal Problem: GI bleed With history of hemorrhoids: FOBT positive - Hemoglobin 6.6, transfused 2 units of packed RBCs, continue PPI - GI consulted, discussed with Labauer, Azucena Freed  Acute blood loss anemia: Hemoglobin on admission 6.6.  - Transfuse 2 units packed RBCs, serial H&H, continue PPI, ferrous sulfate  Diabetes mellitus type 2: CBGs 165 on admission - Held metformin -continue sliding scale insulin  Essential hypertension - Continue losartan and metoprolol -Hold HCTZ for now   Multiple myeloma on Velcade: Patient due for chemotherapy at [facility]  - Patient's wife will call to  postpone chemotherapy until next week   Aortic stenosis s/p aortic valve replacement  Leukopenia:WBC 3.7 on admission. - Continue to monitor   Suspected epididymitis - Continue ciprofloxacin  Parkinson's disease - Continue Sinemet  BPH - Continue finasteride and Proscar  Hyperlipidemia - continue pravastatin  GERD - Continue Protonix  Code Status:Full code  DVT Prophylaxis:   SCD's   Family Communication: Discussed in detail with the patient, all imaging results, lab results explained to the patient and wife   Disposition Plan:   Time Spent in minutes   25 minutes  Procedures:  None   Consultants:   GI    Antimicrobials:      Medications  Scheduled Meds: . sodium chloride  10 mL/hr Intravenous Once  . amLODipine  5 mg Oral Daily  . carbidopa-levodopa  3 tablet Oral QID  . cholecalciferol  1,000 Units Oral Daily  . cyanocobalamin  500 mcg Oral BID  . ferrous sulfate  325 mg Oral QHS  . finasteride  5 mg Oral Daily  . gabapentin  800 mg Oral TID  . insulin aspart  0-9 Units Subcutaneous TID WC  . metoprolol  25 mg Oral BID  .  pantoprazole  40 mg Oral Daily  . pravastatin  40 mg Oral QPM  . sodium chloride flush  3 mL Intravenous Q12H   Continuous Infusions: . sodium chloride 75 mL/hr at 07/16/15 0917   PRN Meds:.albuterol, hydroxypropyl methylcellulose / hypromellose, ondansetron **OR** ondansetron (ZOFRAN) IV, polyvinyl alcohol, zolpidem   Antibiotics   Anti-infectives    None        Subjective:   [name] was seen and examined today.  No BM as of yet this morning. Ambulating in the room. Wife at the bedside. Patient denies dizziness, chest pain, shortness of breath, abdominal pain, N/V, new weakness, numbess, tingling. No acute events overnight.    Objective:   Filed Vitals:   07/16/15 0259 07/16/15 0400 07/16/15 0715  BP: 168/88 171/84   Pulse: 95 89   Temp: 97.6 F (36.4 C)    TempSrc: Oral    Resp: 18    Height: 5' 9"   (1.753 m)  5' 9"  (1.753 m)  Weight: 95.709 kg (211 lb)  86.138 kg (189 lb 14.4 oz)  SpO2: 100% 97%     Intake/Output Summary (Last 24 hours) at 07/16/15 1040 Last data filed at 07/16/15 0900  Gross per 24 hour  Intake    240 ml  Output    400 ml  Net   -160 ml     Wt Readings from Last 3 Encounters:  07/16/15 86.138 kg (189 lb 14.4 oz)  03/18/15 92.1 kg (203 lb 0.7 oz)  01/31/15 96.163 kg (212 lb)     Exam  General: Alert and oriented x 3, NAD  HEENT:  PERRLA, EOMI, Anicteric Sclera, mucous membranes moist.   Neck: Supple, no JVD  Cardiovascular: S1 S2 auscultated, no rubs, murmurs or gallops. Regular rate and rhythm.  Respiratory: Clear to auscultation bilaterally, no wheezing, rales or rhonchi  Gastrointestinal: Soft, nontender, nondistended, + bowel sounds  Ext: no cyanosis clubbing or edema  Neuro: AAOx3, Cr N's II- XII. Strength 5/5 upper and lower extremities bilaterally  Skin: No rashes  Psych: Normal affect and demeanor, alert and oriented x3    Data Reviewed:  I have personally reviewed following labs and imaging studies  Micro Results No results found for this or any previous visit (from the past 240 hour(s)).  Radiology Reports Dg Chest 2 View  07/16/2015  CLINICAL DATA:  Anemia.  Leukopenia. EXAM: CHEST  2 VIEW COMPARISON:  03/09/2013 FINDINGS: Chronic borderline cardiomegaly. Stable aortic tortuosity. The patient is status post median sternotomy for aortic valve replacement. Mild chronic elevation of the lateral right diaphragm. Low volume chest. There is no edema, consolidation, effusion, or pneumothorax. IMPRESSION: Stable.  No evidence of acute disease. Electronically Signed   By: Monte Fantasia M.D.   On: 07/16/2015 06:35    Lab Data:  CBC:  Recent Labs Lab 07/16/15 0310 07/16/15 0710  WBC 3.7*  --   HGB 6.6* 6.2*  HCT 23.2* 21.6*  MCV 78.6  --   PLT 194  --    Basic Metabolic Panel:  Recent Labs Lab 07/16/15 0310  NA 131*  K  3.8  CL 99*  CO2 27  GLUCOSE 165*  BUN 10  CREATININE 0.52*  CALCIUM 10.1   GFR: Estimated Creatinine Clearance: 96.1 mL/min (by C-G formula based on Cr of 0.52). Liver Function Tests:  Recent Labs Lab 07/16/15 0310  AST 24  ALT <5*  ALKPHOS 96  BILITOT 0.6  PROT 7.8  ALBUMIN 3.2*   No results for input(s):  LIPASE, AMYLASE in the last 168 hours. No results for input(s): AMMONIA in the last 168 hours. Coagulation Profile:  Recent Labs Lab 07/16/15 0310  INR 0.96   Cardiac Enzymes: No results for input(s): CKTOTAL, CKMB, CKMBINDEX, TROPONINI in the last 168 hours. BNP (last 3 results) No results for input(s): PROBNP in the last 8760 hours. HbA1C: No results for input(s): HGBA1C in the last 72 hours. CBG:  Recent Labs Lab 07/16/15 0743  GLUCAP 154*   Lipid Profile: No results for input(s): CHOL, HDL, LDLCALC, TRIG, CHOLHDL, LDLDIRECT in the last 72 hours. Thyroid Function Tests: No results for input(s): TSH, T4TOTAL, FREET4, T3FREE, THYROIDAB in the last 72 hours. Anemia Panel: No results for input(s): VITAMINB12, FOLATE, FERRITIN, TIBC, IRON, RETICCTPCT in the last 72 hours. Urine analysis:    Component Value Date/Time   COLORURINE YELLOW 07/16/2015 [place] 07/16/2015 0857   LABSPEC 1.012 07/16/2015 0857   PHURINE 7.5 07/16/2015 0857   GLUCOSEU NEGATIVE 07/16/2015 0857   GLUCOSEU NEGATIVE 04/25/2012 1108   HGBUR NEGATIVE 07/16/2015 0857   BILIRUBINUR NEGATIVE 07/16/2015 0857   KETONESUR NEGATIVE 07/16/2015 0857   PROTEINUR NEGATIVE 07/16/2015 0857   UROBILINOGEN 0.2 05/17/2012 1021   NITRITE NEGATIVE 07/16/2015 0857   LEUKOCYTESUR NEGATIVE 07/16/2015 0857     RAI,RIPUDEEP M.D. [facility] 07/16/2015, 10:40 AM  Pager: [phone] Between 7am to 7pm - call Pager - [phone]  After 7pm go to [email or web address] - password [email or web address]  Call night coverage person covering after 7pm

## 2015-07-16 NOTE — Care Management Note (Signed)
Case Management Note  Patient Details  Name: RENAY LANGENBERG MRN: [id number] Date of Birth: 1945-09-05  Subjective/Objective:                 Spoke with patient and wife in room. Patient is getting chemo at [place] in [place]. He is also followed at [facility]. Patient has [employer] and uses it to cover his medications. Patient also has medicare. Patient Chemo due tomorrow, however wife states that she spoke with oncologist and they decided that he not have Chemo this week, and skip it until next Friday. Patient receiving transfusions for GI bleed. Will have EGD and possibly capsule study per patient. Indpenedent at home with wife PTA. Has cane.    Action/Plan:  No CM needs identified at this time, will continue to follow.  Expected Discharge Date:                  Expected Discharge Plan:  Home/Self Care  In-House Referral:     Discharge planning Services  CM Consult  Post Acute Care Choice:    Choice offered to:     DME Arranged:    DME Agency:     HH Arranged:    HH Agency:     Status of Service:  In process, will continue to follow  Medicare Important Message Given:    Date Medicare IM Given:    Medicare IM give by:    Date Additional Medicare IM Given:    Additional Medicare Important Message give by:     If discussed at [place] of Stay Meetings, dates discussed:    Additional Comments:  [name], RN 07/16/2015, 2:42 PM

## 2015-07-16 NOTE — H&P (Addendum)
History and Physical    [name] FXJ:[id number] DOB: 1945-06-13 DOA: 07/16/2015  Referring MD/NP/PA: Dr. Rex Kras  PCP: Buzzy Han, MD  Patient coming from: Home  Chief Complaint: "blood in the commode and seat"  HPI: [name] is a 70 y.o. male with medical history significant of multiple myeloma on Velcade, HTN, hemorrhoids, BPH, aortic stent stenosis status post repair; who presents after having a bloody bowel movement. Patient woke up this morning around 2 AM it went to use the bathroom after using the bathroom he saw that there was blood on the toilet seat and in the commode. He had already put toilet paper in the commode and could not tell if there were any blood clots. Tried wiping himself to clean up the blood, but after changing himself that his underwear or soiled with blood again. Patient has had a history of external hemorrhoids and states that he intermittently has strained used to bathroom. He recalls that previously one hemorrhoid was injected and it reduced the size. He required a blood transfusion of irradiated blood just last week as his blood counts have remained around 6 due to the chemotherapy. He notes that he is due for his chemotherapy for his multiple myeloma tomorrow at the [place]. Patient receives most of his medical care at the [place]. Patient thinks that his last colonoscopy was 1 year ago. Denies feeling lightheadedness, dizziness, shortness of breath, abdominal pain, nausea, vomiting, or chest pain. He is not currently on any anticoagulation. Otherwise patient notes that he's also been dealing with some scrotal pain for which he is being evaluated and had an ultrasound at the [facility] and is currently on his second round of ciprofloxacin.  ED Course: Upon admission to the emergency department patient was evaluated seen to be afebrile, blood pressure range 178/84, and all other vitals within normal limits. Lab work revealed a WBC 3.7, hemoglobin 6.6,  platelets 194, sodium 131, potassium 3.8, chloride 99, CO2 27, BUN 10, creatinine 0.52, calcium 10.1, INR 0.96, glucose 165.  Patient was ordered to be transfused 1 unit of irradiated red blood cells. Dr. Rex Kras performed the digital rectal exam which revealed large external hemorrhoids and Hemoccult positive stools. Patient and wife asked for the patient to be treated here at [facility] and to be seen by our GI.  Review of Systems: As per HPI otherwise 10 point review of systems negative.   Past Medical History  Diagnosis Date  . HTN (hypertension)   . GERD (gastroesophageal reflux disease)     bravo pH study 2008  . Hyperlipidemia   . Hemorrhoids     external and internal  . Insomnia   . Allergic rhinitis   . OA (osteoarthritis)   . Hiatal hernia   . BPH (benign prostatic hypertrophy)   . Chronic cough   . Anemia 2009  . Diverticulosis of colon     on colonoscopy 2008  . OSA (obstructive sleep apnea)     USES CPAP AS NEEDED  . Headache(784.0)   . IBS (irritable bowel syndrome)   . Periodontitis     chronic with bone loss  . Gout   . Asthma   . Depression     pt denies  . Cataract   . Chronic interstitial cystitis   . Aortic stenosis     a. s/p porcine AVR 2006;  b. s/p redo tissue AVR 12/2011 (Dr. Roxy Manns) - preAVR LHC with no CAD  . H/O aortic valve replacement with porcine valve  2006, 2013  . Diabetes mellitus     type 2  . S/P aortic valve replacement with bioprosthetic valve 12/06/2011    Redo AVR using 23 mm [facility] pericardial tissue valve  . Hx of echocardiogram     a. Echo  (post AVR) 12/2011:  mod LVH, EF 60-65%, Gr 2 diast dysfn, mild AI, AVR ok (mean gradient 19 mmHg), MAC, mild BAE  . Diabetes mellitus without complication ([place])   . Hypertension   . Hyperthyroidism   . Restless leg syndrome     Past Surgical History  Procedure Laterality Date  . Aortic valve replacement  10/13/2004    10m [place] pericardial tissue valve  .  Nasal septoplasty w/ turbinoplasty    . Right knee arthroscopy    . Bunionectomy      right  . Cataract extraction  2009    &   2012    BILATERAL  . Esophagogastroduodenoscopy  08/30/2011    Procedure: ESOPHAGOGASTRODUODENOSCOPY (EGD);  Surgeon: RInda Castle MD;  [place] [facility]  Service: Endoscopy;  Laterality: N/A;  Rm 3005   . Colonoscopy  08/31/2011    Procedure: COLONOSCOPY;  Surgeon: RInda Castle MD;  [place]  Service: Endoscopy;  Laterality: N/A;  . Refractive surgery      bilateral  . Aortic valve replacement  12/06/2011    Procedure: REDO AORTIC VALVE REPLACEMENT (AVR);  Surgeon: CRexene Alberts MD;  [place] [facility]  Service: Open Heart Surgery;  Laterality: N/A;  . Left heart catheterization with coronary angiogram N/A 11/30/2011    Procedure: LEFT HEART CATHETERIZATION WITH CORONARY ANGIOGRAM;  Surgeon: CBurnell Blanks MD;  [facility];  Service: Cardiovascular;  Laterality: N/A;  . Right heart catheterization Bilateral 11/30/2011    Procedure: RIGHT HEART CATH;  Surgeon: CBurnell Blanks MD;  [facility];  Service: Cardiovascular;  Laterality: Bilateral;  . Aorta - femoral artery bypass graft    . Hernia repair    . Joint replacement      knee  . Cardiac surgery      aorta vavle replacement     reports that he quit smoking about 43 years ago. He has never used smokeless tobacco. He reports that he does not drink alcohol or use illicit drugs.  Allergies  Allergen Reactions  . Spironolactone Other (See Comments)    Low tolerance   . Avodart [Dutasteride] Other (See Comments)    Unable to feel or use hands/legs  . Avodart [Dutasteride] Other (See Comments)    Lose of use of arms and legs   . Codeine Other (See Comments)    GI upset in large doses  . Diazepam   . Diazepam Other (See Comments)    Mood changes   . Doxazosin   . Doxazosin Other (See Comments)    Dizziness   . Feraheme [Ferumoxytol]  Swelling    Pedal edema  . Spironolactone     Family History  Problem Relation Age of Onset  . Heart disease Father   . Osteoarthritis Mother   . Hypertension Sister   . Hyperlipidemia      Prior to Admission medications   Medication Sig Start Date End Date Taking? Authorizing Provider  amLODipine (NORVASC) 5 MG tablet Take 1 tablet (5 mg total) by mouth daily. 01/19/15   BLelon Perla MD  aspirin 81 MG tablet Take 81 mg by mouth daily.    Historical Provider, MD  carbidopa-levodopa (SINEMET IR) 25-100 MG tablet Take 3 tablets by mouth 4 (four) times daily.     Historical Provider, MD  carboxymethylcellulose 1 % ophthalmic solution Apply 1 drop to eye 3 (three) times daily as needed (dry eyes).    Historical Provider, MD  Cholecalciferol (VITAMIN D) 2000 UNITS CAPS Take 1 capsule by mouth daily.    Historical Provider, MD  cyanocobalamin 500 MCG tablet Take 500 mcg by mouth 2 (two) times daily.    Historical Provider, MD  docusate sodium (COLACE) 100 MG capsule Take 100 mg by mouth 2 (two) times daily.    Historical Provider, MD  ferrous sulfate 325 (65 FE) MG tablet Take 325 mg by mouth at bedtime.    Historical Provider, MD  finasteride (PROSCAR) 5 MG tablet Take 5 mg by mouth daily.    Historical Provider, MD  fish oil-omega-3 fatty acids 1000 MG capsule Take 1 g by mouth 2 (two) times daily.     Historical Provider, MD  gabapentin (NEURONTIN) 800 MG tablet Take 800 mg by mouth 3 (three) times daily.    Historical Provider, MD  hydrochlorothiazide (MICROZIDE) 12.5 MG capsule TAKE 1 CAPSULE (12.5 MG TOTAL) BY MOUTH DAILY. 03/10/15   Lelon Perla, MD  losartan (COZAAR) 100 MG tablet Take 100 mg by mouth at bedtime. 01/20/12   Lelon Perla, MD  metFORMIN (GLUCOPHAGE) 500 MG tablet Take 500 mg by mouth 2 (two) times daily with a meal.    Historical Provider, MD  methylcellulose (ARTIFICIAL TEARS) 1 % ophthalmic solution Place 1 drop into both eyes at bedtime as needed (dry  eyes).    Historical Provider, MD  metoprolol (LOPRESSOR) 50 MG tablet Take 0.5 tablets (25 mg total) by mouth 2 (two) times daily. 03/18/15   Jule Ser, DO  pantoprazole (PROTONIX) 40 MG tablet Take 40 mg by mouth daily as needed. For GERD    Historical Provider, MD  pravastatin (PRAVACHOL) 40 MG tablet Take 1 tablet (40 mg total) by mouth every evening. 01/19/15   Lelon Perla, MD  zolpidem (AMBIEN) 10 MG tablet Take 5 mg by mouth at bedtime as needed for sleep.    Historical Provider, MD    Physical Exam: Filed Vitals:   07/16/15 0259  BP: 168/88  Pulse: 95  Temp: 97.6 F (36.4 C)  TempSrc: Oral  Resp: 18  Height: 5' 9"  (1.753 m)  Weight: 95.709 kg (211 lb)  SpO2: 100%      Constitutional: Elderly male appears to be NAD, calm, comfortable Filed Vitals:   07/16/15 0259  BP: 168/88  Pulse: 95  Temp: 97.6 F (36.4 C)  TempSrc: Oral  Resp: 18  Height: 5' 9"  (1.753 m)  Weight: 95.709 kg (211 lb)  SpO2: 100%   Eyes: PERRL, lids and conjunctivae normal ENMT: Mucous membranes are moist. Posterior pharynx clear of any exudate or lesions.  Neck: normal, supple, no masses, no thyromegaly Respiratory: clear to auscultation bilaterally, no wheezing, no crackles. Normal respiratory effort. No accessory muscle use.  Cardiovascular: Regular rate and rhythm,4/6 SEM / rubs / gallops. No extremity edema. 2+ pedal pulses. No carotid bruits.  Abdomen: no tenderness, no masses palpated. No hepatosplenomegaly. Bowel sounds positive.  Musculoskeletal: no clubbing / cyanosis. No joint deformity upper and lower extremities. Good ROM, no contractures. Normal muscle tone.  GU: Deferred as previously done Skin: no rashes, lesions, ulcers. No induration. Pallor present Neurologic: CN 2-12 grossly intact. Sensation intact, DTR normal. Strength 5/5 in all 4.  Psychiatric: Normal judgment and insight. Alert and oriented x 3. Normal mood.     Labs on Admission: I have personally reviewed  following labs and imaging studies  CBC:  Recent Labs Lab 07/16/15 0310  WBC 3.7*  HGB 6.6*  HCT 23.2*  MCV 78.6  PLT 979   Basic Metabolic Panel:  Recent Labs Lab 07/16/15 0310  NA 131*  K 3.8  CL 99*  CO2 27  GLUCOSE 165*  BUN 10  CREATININE 0.52*  CALCIUM 10.1   GFR: Estimated Creatinine Clearance: 100.9 mL/min (by C-G formula based on Cr of 0.52). Liver Function Tests:  Recent Labs Lab 07/16/15 0310  AST 24  ALT <5*  ALKPHOS 96  BILITOT 0.6  PROT 7.8  ALBUMIN 3.2*   No results for input(s): LIPASE, AMYLASE in the last 168 hours. No results for input(s): AMMONIA in the last 168 hours. Coagulation Profile:  Recent Labs Lab 07/16/15 0310  INR 0.96   Cardiac Enzymes: No results for input(s): CKTOTAL, CKMB, CKMBINDEX, TROPONINI in the last 168 hours. BNP (last 3 results) No results for input(s): PROBNP in the last 8760 hours. HbA1C: No results for input(s): HGBA1C in the last 72 hours. CBG: No results for input(s): GLUCAP in the last 168 hours. Lipid Profile: No results for input(s): CHOL, HDL, LDLCALC, TRIG, CHOLHDL, LDLDIRECT in the last 72 hours. Thyroid Function Tests: No results for input(s): TSH, T4TOTAL, FREET4, T3FREE, THYROIDAB in the last 72 hours. Anemia Panel: No results for input(s): VITAMINB12, FOLATE, FERRITIN, TIBC, IRON, RETICCTPCT in the last 72 hours. Urine analysis:    Component Value Date/Time   COLORURINE YELLOW 03/17/2015 1527   APPEARANCEUR CLEAR 03/17/2015 1527   LABSPEC 1.014 03/17/2015 1527   PHURINE 6.0 03/17/2015 1527   GLUCOSEU NEGATIVE 03/17/2015 1527   GLUCOSEU NEGATIVE 04/25/2012 1108   HGBUR NEGATIVE 03/17/2015 1527   BILIRUBINUR NEGATIVE 03/17/2015 1527   KETONESUR NEGATIVE 03/17/2015 1527   PROTEINUR NEGATIVE 03/17/2015 1527   UROBILINOGEN 0.2 05/17/2012 1021   NITRITE NEGATIVE 03/17/2015 1527   LEUKOCYTESUR NEGATIVE 03/17/2015 1527   Sepsis Labs: No results found for this or any previous visit (from  the past 240 hour(s)).   Radiological Exams on Admission: No results found.    Assessment/Plan GI bleed/ external hemorrhoids: Acute. Patient found to have large external hemorrhoids on physical exam in the ED. Hemoccult positive in stool. - admit to a telemetry bed  - IV fluids of normal saline - Will need to consult GI in a.m. for further evaluation  Acute blood loss anemia: Hemoglobin on admission 6.6. Question of symptoms are secondary to GI loss or related to patient's chemotherapy. - Type and screen for 1 unit irradiated pRBCs - Check H&H q 4hrs  - Transfuse as needed to keep hemoglobin is greater than 7. Would suspect patient needs to have hemoglobins at least 8 given previous cardiac history.  - Continue ferrous sulfate  Diabetes mellitus type 2: CBGs 165 on admission - Held metformin - Hypoglycemic protocols - CBGs every before meals with a sensitive sliding scale insulin.  Essential hypertension - Continue losartan and metoprolol - Will need to restart hydrochlorothiazide 1 able  Multiple myeloma on Velcade: Patient due for chemotherapy at [facility] hospital tomorrow.  Aortic stenosis s/p aortic valve replacement  Leukopenia:WBC 3.7 on admission. - Continue to monitor   Suspected epididymitis - Continue ciprofloxacin  Parkinson's disease - Continue Sinemet  BPH - Continue finasteride and Proscar  Hyperlipidemia - continue pravastatin  GERD - Continue Protonix  DVT prophylaxis: SCD Code Status:  Full Family Communication: Discussed overall plan with the patient's wife present at bedside Disposition Plan:   Consults called: None Admission status: Observation telemetry   Norval Morton MD [facility] Pager [phone]  If 7PM-7AM, please contact night-coverage [email or web address] Password [facility]  07/16/2015, 5:33 AM

## 2015-07-16 NOTE — ED Notes (Signed)
Patient transported to X-ray 

## 2015-07-16 NOTE — Consult Note (Addendum)
Consultation  Referring Provider: Dr. Tana Coast      Primary Care Physician:  Buzzy Han, MD Primary Gastroenterologist:   Dr. Deatra Ina and Dr. Carlean Purl and [place]  Reason for Consultation:   Rectal bleeding       Impression / Plan:  Impression: 1. Rectal Bleeding: Chronic, thought to be due to hemorrhoids in the past, this am pt reports large amt of brb and feeling weaker than nl; hgb at time of admission 6.6 (though never far from 7 per pt)- consider relation to chemotherapy vs chronic gi bleed 2. Acute on chronic anemia: See above.  3. Multiple Myeloma: [facility]-due for tx tomorrow 10am 4. Aortic stenosis s/p aortic valve replacement 5. Epididymitis: Currently on tx with second round of ciprofloxacin 6. GERD: Controlled on Protonix at home  Plan: 1. Continue supportive treatment 2. Agree with transusion of irradiated pRBCs 3. Continue serial hgb, transfuse as necessary 4. Continue ferrous sulfate 5. Continue Protonix per home med list 6. At this time, no need for emergent Endoscopy procedures as this is chronic for the patient; though could consider pill capsule endoscopy in the future if thought to be advantagous-we did discuss in detail at time of my interview and pt agrees to proceed if deemed necessary 7. Will discuss above with Dr. Carlean Purl, please await further recommendations   Thank you for your kind consultation, we will continue to follow.  [name] [facility]  07/16/2015, 10:53 AM     [facility] Attending   I have taken an interval history, reviewed the chart and examined the patient. I agree with the Advanced Practitioner's note, impression and recommendations.   On my exam he has:  Anal skin tags not external hemorrhoids Tender posterior rectum with fissure, no mass  He has had recurrent hemorrhoid bleeding Also has chronic anemia from multiple myeloma  Plan  1) MiraLax every day (stools have been hard) 2)  Treat anal fissure with diltiazem cream 2% tid # 30 g - we called it in 3 refills 3) DC home today 4) I will call him and set up outpatient f/u for July consider banding of internal hemorrhoids which he must have  Gatha Mayer, MD, [facility] Gastroenterology [phone] (pager) [phone] after 5 PM, weekends and holidays  07/16/2015 4:12 PM           HPI:   [name] is a 70 y.o. Caucasian male, with a history significant for multiple myeloma on Velcade, hypertension, hemorrhoids, BPH and aortic stent stenosis status post repair who presented to the ER on 07/16/2015 with a complaint of bright red blood in the toilet this morning. The patient is found laying in bed this morning with his wife by his side. She does assist with his history. He explains that he has a long history of trouble with external hemorrhoids which have been previously evaluated for surgery, which he declined a year ago due to discussion about pain level after the procedure. The patient tells me that he often has some bright red blood with bowel movements. Yesterday he did have a bowel movement for the first time in 2 days and began experiencing a lot of rectal pain. At that time he put Vaseline on his hemorrhoids and predominantly depend to wear overnight. When he awoke he "felt fine", but then upon going to the bathroom he did see a large amount of bright red blood on the toilet seat as well as in the toilet  which continued after he got up. Patient also describes feeling somewhat weak and being even "more pale than normal". At that point he told his wife they needed to go to the ER.  Per the patient's wife he has had over 12 units of blood since February of this year for his chronic anemia. Apparently there is question whether this is a GI source versus effect of his multiple myeloma. She also noted that the patient's hemoglobin has not been over 7 for "years".  Patient also describes multiple other health  complaints while I'm in the room including "infection in my scrotum", for which she is currently on's his second round of ciprofloxacin. Patient also tells me that he has had full workup for his anemia in the past and "nobody ever did anything".  He is due for a chemotherapy treatment tomorrow at 10 AM. Patient does report that they are trying to get him in for possible stem cell therapy for MM in [place], this is of utmost importance to he and his wife who are first and foremost concerned about his cancer.  ED Course: Upon admission to the emergency department patient's vitals were normal, lab work revealed a white blood cell count 3.7, hemoglobin 6.6, platelets 194, INR 0.96 and a creatinine of 0.52. An order was placed for transfusion of 1 unit of a radiated red blood cells. Digital rectal exam did reveal large external hemorrhoids and Hemoccult-positive stools.  GI History: 08/31/11-EGD-Dr. Kaplan: normal 08/31/11-[place]-Dr. Kaplan: Diverticula in the transverse colon, internal hemorrhoids and otherwise normal exam 10/21/11-After above Capsule endoscopy was discussed, but later declined by the pt after discussion with Dr.  and low potential for malignancy, pt placed on lifelong iron and periodic CBC   Past Medical History  Diagnosis Date  . HTN (hypertension)   . GERD (gastroesophageal reflux disease)     bravo pH study 2008  . Hyperlipidemia   . Hemorrhoids     external and internal  . Insomnia   . Allergic rhinitis   . OA (osteoarthritis)   . Hiatal hernia   . BPH (benign prostatic hypertrophy)   . Chronic cough   . Anemia 2009  . Diverticulosis of colon     on colonoscopy 2008  . OSA (obstructive sleep apnea)     USES CPAP AS NEEDED  . Headache(784.0)   . IBS (irritable bowel syndrome)   . Periodontitis     chronic with bone loss  . Gout   . Asthma   . Depression     pt denies  . Cataract   . Chronic interstitial cystitis   . Aortic stenosis     a. s/p porcine AVR  2006;  b. s/p redo tissue AVR 12/2011 (Dr. Owen) - preAVR LHC with no CAD  . H/O aortic valve replacement with porcine valve     2006, 2013  . Diabetes mellitus     type 2  . S/P aortic valve replacement with bioprosthetic valve 12/06/2011    Redo AVR using 23 mm Edwards Magna Ease pericardial tissue valve  . Hx of echocardiogram     a. Echo  (post AVR) 12/2011:  mod LVH, EF 60-65%, Gr 2 diast dysfn, mild AI, AVR ok (mean gradient 19 mmHg), MAC, mild BAE  . Diabetes mellitus without complication (HCC)   . Hypertension   . Hyperthyroidism   . Restless leg syndrome     Past Surgical History  Procedure Laterality Date  . Aortic valve replacement  10/13/2004      84m Edwards Perimount pericardial tissue valve  . Nasal septoplasty w/ turbinoplasty    . Right knee arthroscopy    . Bunionectomy      right  . Cataract extraction  2009    &   2012    BILATERAL  . Esophagogastroduodenoscopy  08/30/2011    Procedure: ESOPHAGOGASTRODUODENOSCOPY (EGD);  Surgeon: RInda Castle MD;  [facility]  Service: Endoscopy;  Laterality: N/A;  Rm 3005   . Colonoscopy  08/31/2011    Procedure: COLONOSCOPY;  Surgeon: RInda Castle MD;  [facility]  Service: Endoscopy;  Laterality: N/A;  . Refractive surgery      bilateral  . Aortic valve replacement  12/06/2011    Procedure: REDO AORTIC VALVE REPLACEMENT (AVR);  Surgeon: CRexene Alberts MD;  [facility]  Service: Open Heart Surgery;  Laterality: N/A;  . Left heart catheterization with coronary angiogram N/A 11/30/2011    Procedure: LEFT HEART CATHETERIZATION WITH CORONARY ANGIOGRAM;  Surgeon: CBurnell Blanks MD;  [facility];  Service: Cardiovascular;  Laterality: N/A;  . Right heart catheterization Bilateral 11/30/2011    Procedure: RIGHT HEART CATH;  Surgeon: CBurnell Blanks MD;  [facility];  Service: Cardiovascular;  Laterality: Bilateral;  . Aorta - femoral artery bypass graft    .  Hernia repair    . Joint replacement      knee  . Cardiac surgery      aorta vavle replacement    Family History  Problem Relation Age of Onset  . Heart disease Father   . Osteoarthritis Mother   . Hypertension Sister   . Hyperlipidemia    No family history of liver or pancreatic disease, irritable bowel disease, breast cancer, ovarian cancer, colon cancer or polyps.  Social History  Substance Use Topics  . Smoking status: Former Smoker    Quit date: 09/30/1971  . Smokeless tobacco: Never Used  . Alcohol Use: No    Prior to Admission medications   Medication Sig Start Date End Date Taking? Authorizing Provider  carbidopa-levodopa (SINEMET IR) 25-100 MG tablet Take 3 tablets by mouth 4 (four) times daily.    Yes Historical Provider, MD  carboxymethylcellulose 1 % ophthalmic solution Apply 1 drop to eye 3 (three) times daily as needed (dry eyes).   Yes Historical Provider, MD  cyanocobalamin 500 MCG tablet Take 500 mcg by mouth 2 (two) times daily.   Yes Historical Provider, MD  docusate sodium (COLACE) 100 MG capsule Take 100 mg by mouth 2 (two) times daily.   Yes Historical Provider, MD  finasteride (PROSCAR) 5 MG tablet Take 5 mg by mouth daily.   Yes Historical Provider, MD  fish oil-omega-3 fatty acids 1000 MG capsule Take 1 g by mouth daily.    Yes Historical Provider, MD  gabapentin (NEURONTIN) 800 MG tablet Take 800 mg by mouth 3 (three) times daily.   Yes Historical Provider, MD  metFORMIN (GLUCOPHAGE) 500 MG tablet Take 500 mg by mouth 2 (two) times daily with a meal.   Yes Historical Provider, MD  methylcellulose (ARTIFICIAL TEARS) 1 % ophthalmic solution Place 1 drop into both eyes at bedtime as needed (dry eyes).   Yes Historical Provider, MD  pantoprazole (PROTONIX) 40 MG tablet Take 40 mg by mouth daily as needed. For GERD   Yes Historical Provider, MD  pravastatin (PRAVACHOL) 40 MG tablet Take 1 tablet (40 mg total) by mouth every evening. 01/19/15  Yes BLelon Perla  MD  zolpidem (AMBIEN) 10 MG tablet Take 5 mg by mouth at bedtime as needed for sleep.   Yes Historical Provider, MD  hydrochlorothiazide (MICROZIDE) 12.5 MG capsule TAKE 1 CAPSULE (12.5 MG TOTAL) BY MOUTH DAILY. Patient not taking: Reported on 07/16/2015 03/10/15   Lelon Perla, MD    Current Facility-Administered Medications  Medication Dose Route Frequency Provider Last Rate Last Dose  . 0.9 %  sodium chloride infusion  10 mL/hr Intravenous Once Wenda Overland Little, MD      . 0.9 %  sodium chloride infusion   Intravenous Continuous Norval Morton, MD 75 mL/hr at 07/16/15 0917    . albuterol (PROVENTIL) (2.5 MG/3ML) 0.083% nebulizer solution 2.5 mg  2.5 mg Nebulization Q2H PRN Norval Morton, MD      . amLODipine (NORVASC) tablet 5 mg  5 mg Oral Daily Norval Morton, MD   5 mg at 07/16/15 0911  . carbidopa-levodopa (SINEMET IR) 25-100 MG per tablet immediate release 3 tablet  3 tablet Oral QID Norval Morton, MD   3 tablet at 07/16/15 0911  . cholecalciferol (VITAMIN D) tablet 1,000 Units  1,000 Units Oral Daily Norval Morton, MD   1,000 Units at 07/16/15 0911  . cyanocobalamin tablet 500 mcg  500 mcg Oral BID Norval Morton, MD   500 mcg at 07/16/15 0910  . ferrous sulfate tablet 325 mg  325 mg Oral QHS Rondell A Tamala Julian, MD      . finasteride (PROSCAR) tablet 5 mg  5 mg Oral Daily Norval Morton, MD   5 mg at 07/16/15 0910  . gabapentin (NEURONTIN) capsule 800 mg  800 mg Oral TID Norval Morton, MD   800 mg at 07/16/15 0910  . hydroxypropyl methylcellulose / hypromellose (ISOPTO TEARS / GONIOVISC) 2.5 % ophthalmic solution 1 drop  1 drop Both Eyes QHS PRN Rondell A Tamala Julian, MD      . insulin aspart (novoLOG) injection 0-9 Units  0-9 Units Subcutaneous TID WC Norval Morton, MD   2 Units at 07/16/15 0909  . metoprolol tartrate (LOPRESSOR) tablet 25 mg  25 mg Oral BID Norval Morton, MD   25 mg at 07/16/15 0910  . ondansetron (ZOFRAN) tablet 4 mg  4 mg Oral Q6H PRN Norval Morton, MD       Or  . ondansetron (ZOFRAN) injection 4 mg  4 mg Intravenous Q6H PRN Rondell A Tamala Julian, MD      . pantoprazole (PROTONIX) EC tablet 40 mg  40 mg Oral Daily Norval Morton, MD   40 mg at 07/16/15 0911  . polyvinyl alcohol (LIQUIFILM TEARS) 1.4 % ophthalmic solution 1 drop  1 drop Both Eyes TID PRN Norval Morton, MD      . pravastatin (PRAVACHOL) tablet 40 mg  40 mg Oral QPM Rondell A Smith, MD      . sodium chloride flush (NS) 0.9 % injection 3 mL  3 mL Intravenous Q12H Rondell A Tamala Julian, MD      . zolpidem (AMBIEN) tablet 5 mg  5 mg Oral QHS PRN Norval Morton, MD        Allergies as of 07/16/2015 - Review Complete 07/16/2015  Allergen Reaction Noted  . Spironolactone Other (See Comments) 01/31/2015  . Avodart [dutasteride] Other (See Comments)   . Avodart [dutasteride] Other (See Comments) 01/31/2015  . Codeine Other (See Comments)   . Diazepam  12/27/2012  . Diazepam Other (See Comments) 01/31/2015  .  Doxazosin  12/27/2012  . Doxazosin Other (See Comments) 01/31/2015  . Feraheme [ferumoxytol] Swelling 11/15/2011  . Spironolactone  12/27/2012     Review of Systems:    Constitutional: Positive for weakness No weight loss, fever, chills HEENT: Eyes: No change in vision               Ears, Nose, Throat:  No change in hearing Skin: No rash or itching Cardiovascular: No chest pain, chest pressure or chest discomfort. No palpitations or edema Respiratory: No SOB, cough or sputum Gastrointestinal: See HPI and otherwise negative Genitourinary: Positive for scrotal swelling and pain No dysuria or change in urinary frequency Neurological: No headache, dizziness, syncope, numbness or tingling in the extremities Musculoskeletal: No muscle, back pain, joint pain or stiffness. Hematologic: Positive for chronic IDA and rectal bleeding  Psychiatric: No history of depression or anxiety Endocrinologic: No reports of sweating, cold or heat intolerance, poyluria or  polydypsia Allergies: No history of asthma, hives or eczema    Physical Exam:  Vital signs in last 24 hours: Temp:  [97.6 F (36.4 C)] 97.6 F (36.4 C) (06/08 0259) Pulse Rate:  [89-95] 89 (06/08 0400) Resp:  [18] 18 (06/08 0259) BP: (168-171)/(84-88) 171/84 mmHg (06/08 0400) SpO2:  [97 %-100 %] 97 % (06/08 0400) Weight:  [189 lb 14.4 oz (86.138 kg)-211 lb (95.709 kg)] 189 lb 14.4 oz (86.138 kg) (06/08 0715)   General:   Pleasant Caucasian male appears to be in NAD, Well developed, Well nourished, alert and somewhat agitated Head:  Normocephalic and atraumatic. Eyes:   PEERL, EOMI. No icterus. Conjunctiva pink. Ears:  Normal auditory acuity. Neck:  Supple Throat: Oral cavity and pharynx without inflammation, swelling or lesion. Teeth in good condition. Lungs: Respirations even and unlabored. Lungs clear to auscultation bilaterally.   No wheezes, crackles, or rhonchi.  Heart: Normal S1, S2. 4/6 systolic ejection murmur. Regular rate and rhythm. No peripheral edema, cyanosis or pallor.  Abdomen:  Soft, nondistended, nontender. No rebound or guarding. Normal bowel sounds. No appreciable masses or hepatomegaly. No abdominal distension.  Rectal:  Not performed. Performed in ER notable for large external hemorrhoids and hemoccult + stool Msk:  Symmetrical without gross deformities. Peripheral pulses intact.  Extremities:  Without edema, no deformity or joint abnormality. Normal ROM, normal sensation. Neurologic:  Alert and  oriented x4;  grossly normal neurologically.  Skin:   Pallor Dry and intact without significant lesions or rashes. Psychiatric: Oriented to person, place and time. Demonstrates good judgement and reason without abnormal affect or behaviors.  LAB RESULTS:  Recent Labs  07/16/15 0310 07/16/15 0710  WBC 3.7*  --   HGB 6.6* 6.2*  HCT 23.2* 21.6*  PLT 194  --    BMET  Recent Labs  07/16/15 0310  NA 131*  K 3.8  CL 99*  CO2 27  GLUCOSE 165*  BUN 10   CREATININE 0.52*  CALCIUM 10.1   LFT  Recent Labs  07/16/15 0310  PROT 7.8  ALBUMIN 3.2*  AST 24  ALT <5*  ALKPHOS 96  BILITOT 0.6   PT/INR  Recent Labs  07/16/15 0310  LABPROT 13.0  INR 0.96    STUDIES: Dg Chest 2 View  07/16/2015  CLINICAL DATA:  Anemia.  Leukopenia. EXAM: CHEST  2 VIEW COMPARISON:  03/09/2013 FINDINGS: Chronic borderline cardiomegaly. Stable aortic tortuosity. The patient is status post median sternotomy for aortic valve replacement. Mild chronic elevation of the lateral right diaphragm. Low volume chest. There is no edema,  consolidation, effusion, or pneumothorax. IMPRESSION: Stable.  No evidence of acute disease. Electronically Signed   By: Jonathon  Watts M.D.   On: 07/16/2015 06:35     PREVIOUS ENDOSCOPIES:            See HPI    

## 2015-07-16 NOTE — ED Notes (Signed)
Dr Rex Kras notified of critical hemoglobin 6.6  Patient to be moved to a room

## 2015-07-16 NOTE — ED Notes (Signed)
The pt woke up approx one hour ago and went to the br. And either bright red blood was coming from his penis or his rectum.  He is anemic and he is currently getting chemo therapy for multiple myeloma  Once a week at the [facility].  He does not have a porta-cath.

## 2015-07-16 NOTE — Care Management Obs Status (Signed)
[place] NOTIFICATION   Patient Details  Name: [name] MRN: [id number] Date of Birth: Oct 10, 1945   Medicare Observation Status Notification Given:  Yes    [name], RN 07/16/2015, 2:45 PM

## 2015-07-16 NOTE — ED Notes (Signed)
Hospitalist at bedside at this time 

## 2015-07-17 LAB — TYPE AND SCREEN
ABO/RH(D): A NEG
ANTIBODY SCREEN: NEGATIVE
Unit division: 0

## 2015-07-29 ENCOUNTER — Telehealth: Payer: Self-pay | Admitting: Internal Medicine

## 2015-07-29 NOTE — Telephone Encounter (Signed)
Pt states he was supposed to follow-up with Dr. Mady Gemma in July. Pt scheduled to see Dr. Carlean Purl 08/12/15@3pm . Pt aware of appt.

## 2015-08-12 ENCOUNTER — Ambulatory Visit: Payer: Self-pay | Admitting: Internal Medicine

## 2015-08-18 ENCOUNTER — Telehealth: Payer: Self-pay | Admitting: Cardiology

## 2015-08-18 DIAGNOSIS — D500 Iron deficiency anemia secondary to blood loss (chronic): Secondary | ICD-10-CM | POA: Diagnosis not present

## 2015-08-18 DIAGNOSIS — R432 Parageusia: Secondary | ICD-10-CM | POA: Diagnosis not present

## 2015-08-18 DIAGNOSIS — C9000 Multiple myeloma not having achieved remission: Secondary | ICD-10-CM | POA: Diagnosis not present

## 2015-08-18 NOTE — Telephone Encounter (Signed)
Received records from [facility] for appointment on 10/08/15 with Dr Stanford Breed.  Records given to [facility] (medical records) for Dr Jacalyn Lefevre schedule on 10/08/15. lp

## 2015-08-19 DIAGNOSIS — D649 Anemia, unspecified: Secondary | ICD-10-CM | POA: Diagnosis not present

## 2015-08-19 DIAGNOSIS — C9000 Multiple myeloma not having achieved remission: Secondary | ICD-10-CM | POA: Diagnosis not present

## 2015-08-19 DIAGNOSIS — D500 Iron deficiency anemia secondary to blood loss (chronic): Secondary | ICD-10-CM | POA: Diagnosis not present

## 2015-08-19 DIAGNOSIS — E119 Type 2 diabetes mellitus without complications: Secondary | ICD-10-CM | POA: Diagnosis not present

## 2015-08-19 DIAGNOSIS — D61818 Other pancytopenia: Secondary | ICD-10-CM | POA: Diagnosis not present

## 2015-08-20 DIAGNOSIS — D500 Iron deficiency anemia secondary to blood loss (chronic): Secondary | ICD-10-CM | POA: Diagnosis not present

## 2015-08-20 DIAGNOSIS — C9000 Multiple myeloma not having achieved remission: Secondary | ICD-10-CM | POA: Diagnosis not present

## 2015-08-20 DIAGNOSIS — E119 Type 2 diabetes mellitus without complications: Secondary | ICD-10-CM | POA: Diagnosis not present

## 2015-08-20 DIAGNOSIS — Z952 Presence of prosthetic heart valve: Secondary | ICD-10-CM | POA: Diagnosis not present

## 2015-08-20 DIAGNOSIS — I10 Essential (primary) hypertension: Secondary | ICD-10-CM | POA: Diagnosis not present

## 2015-09-14 ENCOUNTER — Telehealth: Payer: Self-pay | Admitting: Cardiology

## 2015-09-14 NOTE — Telephone Encounter (Signed)
Received records [facility] for appointment on 10/08/15 with Dr Stanford Breed.  Records given to [facility] (medical records) for Dr Jacalyn Lefevre schedule on 10/08/15. lp

## 2015-09-24 ENCOUNTER — Encounter (HOSPITAL_COMMUNITY): Payer: Self-pay

## 2015-09-24 ENCOUNTER — Inpatient Hospital Stay (HOSPITAL_COMMUNITY)
Admission: EM | Admit: 2015-09-24 | Discharge: 2015-09-25 | DRG: 378 | Disposition: A | Discharge status: Home / Self Care | Payer: Medicare Other | Attending: Internal Medicine | Admitting: Internal Medicine

## 2015-09-24 ENCOUNTER — Emergency Department (HOSPITAL_COMMUNITY): Payer: Medicare Other

## 2015-09-24 DIAGNOSIS — K922 Gastrointestinal hemorrhage, unspecified: Secondary | ICD-10-CM | POA: Diagnosis not present

## 2015-09-24 DIAGNOSIS — I10 Essential (primary) hypertension: Secondary | ICD-10-CM | POA: Diagnosis present

## 2015-09-24 DIAGNOSIS — R55 Syncope and collapse: Secondary | ICD-10-CM | POA: Diagnosis not present

## 2015-09-24 DIAGNOSIS — G4733 Obstructive sleep apnea (adult) (pediatric): Secondary | ICD-10-CM | POA: Diagnosis present

## 2015-09-24 DIAGNOSIS — E861 Hypovolemia: Secondary | ICD-10-CM | POA: Diagnosis present

## 2015-09-24 DIAGNOSIS — I959 Hypotension, unspecified: Secondary | ICD-10-CM

## 2015-09-24 DIAGNOSIS — R011 Cardiac murmur, unspecified: Secondary | ICD-10-CM | POA: Diagnosis not present

## 2015-09-24 DIAGNOSIS — K602 Anal fissure, unspecified: Secondary | ICD-10-CM | POA: Diagnosis present

## 2015-09-24 DIAGNOSIS — M1990 Unspecified osteoarthritis, unspecified site: Secondary | ICD-10-CM | POA: Diagnosis present

## 2015-09-24 DIAGNOSIS — C9000 Multiple myeloma not having achieved remission: Secondary | ICD-10-CM | POA: Diagnosis present

## 2015-09-24 DIAGNOSIS — D649 Anemia, unspecified: Secondary | ICD-10-CM | POA: Diagnosis not present

## 2015-09-24 DIAGNOSIS — Z87891 Personal history of nicotine dependence: Secondary | ICD-10-CM | POA: Diagnosis not present

## 2015-09-24 DIAGNOSIS — K625 Hemorrhage of anus and rectum: Secondary | ICD-10-CM | POA: Diagnosis not present

## 2015-09-24 DIAGNOSIS — Z9841 Cataract extraction status, right eye: Secondary | ICD-10-CM

## 2015-09-24 DIAGNOSIS — G2581 Restless legs syndrome: Secondary | ICD-10-CM | POA: Diagnosis present

## 2015-09-24 DIAGNOSIS — K219 Gastro-esophageal reflux disease without esophagitis: Secondary | ICD-10-CM | POA: Diagnosis present

## 2015-09-24 DIAGNOSIS — Z7984 Long term (current) use of oral hypoglycemic drugs: Secondary | ICD-10-CM | POA: Diagnosis not present

## 2015-09-24 DIAGNOSIS — Z96659 Presence of unspecified artificial knee joint: Secondary | ICD-10-CM | POA: Diagnosis present

## 2015-09-24 DIAGNOSIS — D500 Iron deficiency anemia secondary to blood loss (chronic): Secondary | ICD-10-CM | POA: Diagnosis present

## 2015-09-24 DIAGNOSIS — Z953 Presence of xenogenic heart valve: Secondary | ICD-10-CM

## 2015-09-24 DIAGNOSIS — N400 Enlarged prostate without lower urinary tract symptoms: Secondary | ICD-10-CM | POA: Diagnosis present

## 2015-09-24 DIAGNOSIS — K648 Other hemorrhoids: Secondary | ICD-10-CM | POA: Diagnosis not present

## 2015-09-24 DIAGNOSIS — Z9842 Cataract extraction status, left eye: Secondary | ICD-10-CM

## 2015-09-24 DIAGNOSIS — Z7982 Long term (current) use of aspirin: Secondary | ICD-10-CM | POA: Diagnosis not present

## 2015-09-24 DIAGNOSIS — I951 Orthostatic hypotension: Secondary | ICD-10-CM | POA: Diagnosis not present

## 2015-09-24 DIAGNOSIS — D696 Thrombocytopenia, unspecified: Secondary | ICD-10-CM | POA: Diagnosis present

## 2015-09-24 DIAGNOSIS — E785 Hyperlipidemia, unspecified: Secondary | ICD-10-CM | POA: Diagnosis present

## 2015-09-24 DIAGNOSIS — I2510 Atherosclerotic heart disease of native coronary artery without angina pectoris: Secondary | ICD-10-CM | POA: Diagnosis present

## 2015-09-24 DIAGNOSIS — Z952 Presence of prosthetic heart valve: Secondary | ICD-10-CM

## 2015-09-24 DIAGNOSIS — M109 Gout, unspecified: Secondary | ICD-10-CM | POA: Diagnosis present

## 2015-09-24 DIAGNOSIS — D72819 Decreased white blood cell count, unspecified: Secondary | ICD-10-CM | POA: Diagnosis not present

## 2015-09-24 DIAGNOSIS — R404 Transient alteration of awareness: Secondary | ICD-10-CM | POA: Diagnosis not present

## 2015-09-24 DIAGNOSIS — Z7952 Long term (current) use of systemic steroids: Secondary | ICD-10-CM

## 2015-09-24 DIAGNOSIS — E119 Type 2 diabetes mellitus without complications: Secondary | ICD-10-CM | POA: Diagnosis present

## 2015-09-24 DIAGNOSIS — K601 Chronic anal fissure: Secondary | ICD-10-CM | POA: Diagnosis not present

## 2015-09-24 DIAGNOSIS — R42 Dizziness and giddiness: Secondary | ICD-10-CM | POA: Diagnosis not present

## 2015-09-24 DIAGNOSIS — R531 Weakness: Secondary | ICD-10-CM | POA: Diagnosis not present

## 2015-09-24 LAB — BASIC METABOLIC PANEL
ANION GAP: 7 (ref 5–15)
BUN: 27 mg/dL — ABNORMAL HIGH (ref 6–20)
CO2: 24 mmol/L (ref 22–32)
Calcium: 9.9 mg/dL (ref 8.9–10.3)
Chloride: 102 mmol/L (ref 101–111)
Creatinine, Ser: 0.71 mg/dL (ref 0.61–1.24)
GFR calc Af Amer: >60 mL/min (ref >60)
Glucose, Bld: 252 mg/dL — ABNORMAL HIGH (ref 65–99)
POTASSIUM: 4.1 mmol/L (ref 3.5–5.1)
SODIUM: 133 mmol/L — AB (ref 135–145)

## 2015-09-24 LAB — CBC WITH DIFFERENTIAL/PLATELET
Basophils Absolute: 0 10*3/uL (ref 0.0–0.1)
Basophils Relative: 0 %
Eosinophils Absolute: 0 10*3/uL (ref 0.0–0.7)
Eosinophils Relative: 0 %
HEMATOCRIT: 20.2 % — AB (ref 39.0–52.0)
HEMOGLOBIN: 6 g/dL — AB (ref 13.0–17.0)
LYMPHS ABS: 0.7 10*3/uL (ref 0.7–4.0)
LYMPHS PCT: 10 %
MCH: 26.8 pg (ref 26.0–34.0)
MCHC: 29.7 g/dL — AB (ref 30.0–36.0)
MCV: 90.2 fL (ref 78.0–100.0)
MONO ABS: 0.7 10*3/uL (ref 0.1–1.0)
MONOS PCT: 10 %
NEUTROS ABS: 6.1 10*3/uL (ref 1.7–7.7)
Neutrophils Relative %: 81 %
Platelets: 134 10*3/uL — ABNORMAL LOW (ref 150–400)
RBC: 2.24 MIL/uL — ABNORMAL LOW (ref 4.22–5.81)
RDW: 18 % — AB (ref 11.5–15.5)
WBC: 7.6 10*3/uL (ref 4.0–10.5)

## 2015-09-24 LAB — I-STAT CHEM 8, ED
BUN: 32 mg/dL — AB (ref 6–20)
CHLORIDE: 97 mmol/L — AB (ref 101–111)
CREATININE: 0.6 mg/dL — AB (ref 0.61–1.24)
Calcium, Ion: 1.33 mmol/L — ABNORMAL HIGH (ref 1.12–1.23)
Glucose, Bld: 235 mg/dL — ABNORMAL HIGH (ref 65–99)
HEMATOCRIT: 21 % — AB (ref 39.0–52.0)
Hemoglobin: 7.1 g/dL — ABNORMAL LOW (ref 13.0–17.0)
Potassium: 4 mmol/L (ref 3.5–5.1)
Sodium: 136 mmol/L (ref 135–145)
TCO2: 27 mmol/L (ref 0–100)

## 2015-09-24 LAB — I-STAT CG4 LACTIC ACID, ED
LACTIC ACID, VENOUS: 2.06 mmol/L — AB (ref 0.5–1.9)
Lactic Acid, Venous: 2.39 mmol/L (ref 0.5–1.9)

## 2015-09-24 LAB — PREPARE RBC (CROSSMATCH)

## 2015-09-24 LAB — POC OCCULT BLOOD, ED: Fecal Occult Bld: POSITIVE — AB

## 2015-09-24 LAB — CBG MONITORING, ED: GLUCOSE-CAPILLARY: 233 mg/dL — AB (ref 65–99)

## 2015-09-24 MED ORDER — FOLIC ACID 1 MG PO TABS
1.0000 mg | ORAL_TABLET | Freq: Every day | ORAL | Status: DC
  Administered 2015-09-25: 1 mg via ORAL
  Filled 2015-09-24: qty 1

## 2015-09-24 MED ORDER — ACYCLOVIR 400 MG PO TABS
400.0000 mg | ORAL_TABLET | Freq: Two times a day (BID) | ORAL | Status: DC
  Administered 2015-09-25 (×2): 400 mg via ORAL
  Filled 2015-09-24 (×2): qty 1

## 2015-09-24 MED ORDER — CALCIUM POLYCARBOPHIL 625 MG PO TABS
1250.0000 mg | ORAL_TABLET | Freq: Every day | ORAL | Status: DC
  Administered 2015-09-25: 1250 mg via ORAL
  Filled 2015-09-24: qty 2

## 2015-09-24 MED ORDER — PANTOPRAZOLE SODIUM 40 MG PO TBEC
40.0000 mg | DELAYED_RELEASE_TABLET | Freq: Every day | ORAL | Status: DC
  Administered 2015-09-25: 40 mg via ORAL
  Filled 2015-09-24: qty 1

## 2015-09-24 MED ORDER — PRAVASTATIN SODIUM 40 MG PO TABS
40.0000 mg | ORAL_TABLET | Freq: Every evening | ORAL | Status: DC
  Administered 2015-09-25: 40 mg via ORAL
  Filled 2015-09-24: qty 1

## 2015-09-24 MED ORDER — DOCUSATE SODIUM 100 MG PO CAPS
100.0000 mg | ORAL_CAPSULE | Freq: Two times a day (BID) | ORAL | Status: DC
  Administered 2015-09-25 (×2): 100 mg via ORAL
  Filled 2015-09-24 (×2): qty 1

## 2015-09-24 MED ORDER — FERROUS SULFATE 325 (65 FE) MG PO TABS
325.0000 mg | ORAL_TABLET | Freq: Every day | ORAL | Status: DC
  Administered 2015-09-25: 325 mg via ORAL
  Filled 2015-09-24: qty 1

## 2015-09-24 MED ORDER — INSULIN ASPART 100 UNIT/ML ~~LOC~~ SOLN
0.0000 [IU] | Freq: Three times a day (TID) | SUBCUTANEOUS | Status: DC
  Administered 2015-09-25: 2 [IU] via SUBCUTANEOUS
  Administered 2015-09-25: 1 [IU] via SUBCUTANEOUS

## 2015-09-24 MED ORDER — SODIUM CHLORIDE 0.9 % IV SOLN: Freq: Once | INTRAVENOUS | Status: DC

## 2015-09-24 MED ORDER — DEXAMETHASONE 4 MG PO TABS: 40.0000 mg | ORAL_TABLET | ORAL | Status: DC

## 2015-09-24 MED ORDER — FINASTERIDE 5 MG PO TABS
5.0000 mg | ORAL_TABLET | Freq: Every day | ORAL | Status: DC
  Administered 2015-09-25: 5 mg via ORAL
  Filled 2015-09-24: qty 1

## 2015-09-24 MED ORDER — FAMOTIDINE IN NACL 20-0.9 MG/50ML-% IV SOLN
20.0000 mg | Freq: Two times a day (BID) | INTRAVENOUS | Status: DC
  Administered 2015-09-24 – 2015-09-25 (×2): 20 mg via INTRAVENOUS
  Filled 2015-09-24 (×2): qty 50

## 2015-09-24 MED ORDER — POMALIDOMIDE 4 MG PO CAPS
4.0000 mg | ORAL_CAPSULE | Freq: Every day | ORAL | Status: DC
  Administered 2015-09-25: 4 mg via ORAL
  Filled 2015-09-24: qty 1

## 2015-09-24 MED ORDER — OMEGA-3-ACID ETHYL ESTERS 1 G PO CAPS
1.0000 g | ORAL_CAPSULE | Freq: Every day | ORAL | Status: DC
  Administered 2015-09-25: 1 g via ORAL
  Filled 2015-09-24: qty 1

## 2015-09-24 MED ORDER — SIMVASTATIN 20 MG PO TABS: 20.0000 mg | ORAL_TABLET | Freq: Every day | ORAL | Status: DC

## 2015-09-24 MED ORDER — POLYVINYL ALCOHOL 1.4 % OP SOLN: 1.0000 [drp] | Freq: Every evening | OPHTHALMIC | Status: DC | PRN

## 2015-09-24 MED ORDER — SIMETHICONE 80 MG PO CHEW
80.0000 mg | CHEWABLE_TABLET | Freq: Four times a day (QID) | ORAL | Status: DC | PRN
Start: 2015-09-24 — End: 2015-09-25
  Filled 2015-09-24: qty 1

## 2015-09-24 MED ORDER — CARBIDOPA-LEVODOPA 25-100 MG PO TABS
3.0000 | ORAL_TABLET | Freq: Four times a day (QID) | ORAL | Status: DC
  Administered 2015-09-25 (×3): 3 via ORAL
  Filled 2015-09-24 (×6): qty 3

## 2015-09-24 MED ORDER — GABAPENTIN 400 MG PO CAPS
800.0000 mg | ORAL_CAPSULE | Freq: Three times a day (TID) | ORAL | Status: DC
  Administered 2015-09-25 (×2): 800 mg via ORAL
  Filled 2015-09-24 (×2): qty 2

## 2015-09-24 NOTE — ED Triage Notes (Addendum)
Per EMS - pt had near syncopal episode at [employer]. Pt was helped to floor, did not hit head and no LOC. BP initially 80/50, given 500cc fluid, last BP 108/70. Currently treated at [facility] for myeloma and participating in clinical medication trial. Hx 2 aortic valve replacements.

## 2015-09-24 NOTE — ED Provider Notes (Signed)
Consulted with gastroenterologist Dr. Loletha Carrow who will see patient in hospital.   Orlie Dakin, MD 09/24/15 1807

## 2015-09-24 NOTE — ED Notes (Signed)
[name]; Transporter, transporting pt to CT.

## 2015-09-24 NOTE — ED Notes (Signed)
Pt wife now reporting pt did hit head on rack at [employer] and states this could also contribute to pt neck pain. Pt now placed in c-collar.

## 2015-09-24 NOTE — ED Notes (Signed)
Admitting at bedside 

## 2015-09-24 NOTE — ED Provider Notes (Signed)
[facility] DEPT Provider Note   CSN: [id number] Arrival date & time: 09/24/15  1436     History   Chief Complaint Chief Complaint  Patient presents with  . Near Syncope    HPI [name] is a 70 y.o. male.  HPI This is a 70 year old man with a history of a aortic valve replacement, multiple myeloma, and diabetes who presents today after a syncopal episode. He was at the [employer]. He became very lightheaded and his wife helped him to poor. She does not think that he actually lost consciousness. He has been anemic with his last hemoglobin of 7.1 on June 8. He states he has been getting iron IV once a week. He denies any history of GI bleeding but states that he does have hemorrhoids. He denies any hematemesis. He denies any dark tarry stools or rectal bleeding recently. He has been having normal bowel movements with his last bowel movement yesterday. Any headache after the fall but states he did have a headache this morning. He has some neck pain after the fall. He denies any numbness, tingling, or lateralized weakness. He denies any chest pain, dyspnea, or abdominal pain. He denies any other injuries. Past Medical History:  Diagnosis Date  . Allergic rhinitis   . Anemia 2009  . Aortic stenosis    a. s/p porcine AVR 2006;  b. s/p redo tissue AVR 12/2011 (Dr. Roxy Manns) - preAVR LHC with no CAD  . Asthma   . BPH (benign prostatic hypertrophy)   . Cancer ([place])    multiple myeloma  . Cataract   . Chronic cough   . Chronic interstitial cystitis   . Depression    pt denies  . Diabetes mellitus    type 2  . Diabetes mellitus without complication ([place])   . Diverticulosis of colon    on colonoscopy 2008  . GERD (gastroesophageal reflux disease)    bravo pH study 2008  . Gout   . H/O aortic valve replacement with porcine valve    2006, 2013  . Headache(784.0)   . Hemorrhoids    external and internal  . Hiatal hernia   . HTN (hypertension)   . Hx of echocardiogram    a.  Echo  (post AVR) 12/2011:  mod LVH, EF 60-65%, Gr 2 diast dysfn, mild AI, AVR ok (mean gradient 19 mmHg), MAC, mild BAE  . Hyperlipidemia   . Hypertension   . Hyperthyroidism   . IBS (irritable bowel syndrome)   . Insomnia   . Lower GI bleed 07/16/2015  . OA (osteoarthritis)   . OSA (obstructive sleep apnea)    USES CPAP AS NEEDED  . Periodontitis    chronic with bone loss  . Restless leg syndrome   . S/P aortic valve replacement with bioprosthetic valve 12/06/2011   Redo AVR using 23 mm [facility] Ease pericardial tissue valve    Patient Active Problem List   Diagnosis Date Noted  . Anemia 07/16/2015  . GI bleed 07/16/2015  . Hemorrhoids 07/16/2015  . Multiple myeloma not having achieved remission ([place])   . Syncope and collapse   . Acute blood loss anemia 03/17/2015  . Bruit 01/19/2015  . S/P aortic valve replacement with bioprosthetic valve 12/06/2011  . Aortic stenosis 11/30/2011  . Chronic daily headache 11/30/2011  . Iron deficiency anemia, unspecified 09/15/2011  . IBS (irritable bowel syndrome) 09/15/2011  . Syncope 08/29/2011  . Cough syncope 10/13/2010  . HYPERLIPIDEMIA-MIXED 07/02/2008  . Essential hypertension 07/02/2008  .  GERD 07/02/2008  . OBSTRUCTIVE SLEEP APNEA 01/15/2007  . ALLERGIC  RHINITIS 01/15/2007  . INSOMNIA 01/15/2007  . OSTEOARTHRITIS 12/02/2006  . BPH (benign prostatic hyperplasia) 12/02/2006  . AORTIC VALVE REPLACEMENT, HX OF 12/02/2006    Past Surgical History:  Procedure Laterality Date  . AORTA - FEMORAL ARTERY BYPASS GRAFT    . AORTIC VALVE REPLACEMENT  10/13/2004   2m [place] pericardial tissue valve  . AORTIC VALVE REPLACEMENT  12/06/2011   Procedure: REDO AORTIC VALVE REPLACEMENT (AVR);  Surgeon: CRexene Alberts MD;  [place] [facility]  Service: Open Heart Surgery;  Laterality: N/A;  . BUNIONECTOMY     right  . CARDIAC SURGERY     aorta vavle replacement  . CATARACT EXTRACTION  2009    &   2012   BILATERAL  .  COLONOSCOPY  08/31/2011   Procedure: COLONOSCOPY;  Surgeon: RInda Castle MD;  [facility]  Service: Endoscopy;  Laterality: N/A;  . ESOPHAGOGASTRODUODENOSCOPY  08/30/2011   Procedure: ESOPHAGOGASTRODUODENOSCOPY (EGD);  Surgeon: RInda Castle MD;  [place] [facility]  Service: Endoscopy;  Laterality: N/A;  Rm 3005   . HERNIA REPAIR    . JOINT REPLACEMENT     knee  . LEFT HEART CATHETERIZATION WITH CORONARY ANGIOGRAM N/A 11/30/2011   Procedure: LEFT HEART CATHETERIZATION WITH CORONARY ANGIOGRAM;  Surgeon: CBurnell Blanks MD;  [facility];  Service: Cardiovascular;  Laterality: N/A;  . NASAL SEPTOPLASTY W/ TURBINOPLASTY    . REFRACTIVE SURGERY     bilateral  . RIGHT HEART CATHETERIZATION Bilateral 11/30/2011   Procedure: RIGHT HEART CATH;  Surgeon: CBurnell Blanks MD;  [facility];  Service: Cardiovascular;  Laterality: Bilateral;  . right knee arthroscopy         Home Medications    Prior to Admission medications   Medication Sig Start Date End Date Taking? Authorizing Provider  acyclovir (ZOVIRAX) 800 MG tablet Take 0.5 tablets by mouth 2 (two) times daily.   Yes Historical Provider, MD  aspirin EC 81 MG tablet Take 81 mg by mouth daily.   Yes Historical Provider, MD  carbidopa-levodopa (SINEMET IR) 25-100 MG tablet Take 3 tablets by mouth 4 (four) times daily.    Yes Historical Provider, MD  carboxymethylcellulose 1 % ophthalmic solution Apply 1 drop to eye 3 (three) times daily as needed (dry eyes).   Yes Historical Provider, MD  cetirizine (ZYRTEC) 10 MG chewable tablet Chew 10 mg by mouth 2 (two) times daily as needed for other. At the onset of tongue swelling/angioedema 09/24/15  Yes Historical Provider, MD  cyclobenzaprine (FLEXERIL) 10 MG tablet Take 5 mg by mouth 3 (three) times daily as needed for muscle spasms.   Yes Historical Provider, MD  dexamethasone (DECADRON) 4 MG tablet Take 40 mg by mouth once a week. Take ten  tablets (445m every Wednesday with pomalidomide.   Yes Historical Provider, MD  diazepam (VALIUM) 2 MG tablet Take 2 mg by mouth at bedtime as needed for other. Restless leg   Yes Historical Provider, MD  docusate sodium (COLACE) 100 MG capsule Take 100 mg by mouth 2 (two) times daily. 08/20/15  Yes Historical Provider, MD  finasteride (PROSCAR) 5 MG tablet Take 5 mg by mouth daily.   Yes Historical Provider, MD  fish oil-omega-3 fatty acids 1000 MG capsule Take 1 g by mouth daily.    Yes Historical Provider, MD  folic acid (FOLVITE) 1 MG tablet Take 1 mg by mouth daily.  09/24/15  Yes Historical Provider, MD  gabapentin (NEURONTIN) 800 MG tablet Take 800 mg by mouth 3 (three) times daily.   Yes Historical Provider, MD  iron sucrose (VENOFER) 20 MG/ML injection Inject 200 mg into the vein once a week. 09/24/15  Yes Historical Provider, MD  ketoconazole (NIZORAL) 2 % cream Apply 1 application topically daily as needed for irritation.   Yes Historical Provider, MD  metFORMIN (GLUCOPHAGE) 500 MG tablet Take 500 mg by mouth 2 (two) times daily with a meal.   Yes Historical Provider, MD  methylcellulose (ARTIFICIAL TEARS) 1 % ophthalmic solution Place 1 drop into both eyes at bedtime as needed (dry eyes).   Yes Historical Provider, MD  pantoprazole (PROTONIX) 40 MG tablet Take 1 tablet (40 mg total) by mouth daily. For GERD 07/16/15  Yes Ripudeep Krystal Eaton, MD  polycarbophil (FIBERCON) 625 MG tablet Take 2 tablets by mouth daily.   Yes Historical Provider, MD  pomalidomide (POMALYST) 4 MG capsule Take 4 mg by mouth daily. Take with water on days 1-21. Repeat every 28 days.   Yes Historical Provider, MD  pravastatin (PRAVACHOL) 40 MG tablet Take 1 tablet (40 mg total) by mouth every evening. 01/19/15  Yes Lelon Perla, MD  simethicone (MYLICON) 80 MG chewable tablet Chew 80 mg by mouth every 6 (six) hours as needed for flatulence.   Yes Historical Provider, MD  simvastatin (ZOCOR) 40 MG tablet Take 20 mg by  mouth daily. 09/24/15  Yes Historical Provider, MD  zinc oxide 20 % ointment Apply 1 application topically 2 (two) times daily as needed for irritation.   Yes Historical Provider, MD  amLODipine (NORVASC) 5 MG tablet Take 1 tablet (5 mg total) by mouth daily. 07/16/15   Ripudeep Krystal Eaton, MD  Cholecalciferol (VITAMIN D) 2000 units CAPS Take 1 capsule (2,000 Units total) by mouth daily. Reported on 07/16/2015 07/16/15   Ripudeep Krystal Eaton, MD  ferrous sulfate 325 (65 FE) MG tablet Take 1 tablet (325 mg total) by mouth at bedtime. Reported on 07/16/2015 07/16/15   Ripudeep Krystal Eaton, MD  metoprolol (LOPRESSOR) 50 MG tablet Take 0.5 tablets (25 mg total) by mouth 2 (two) times daily. 07/16/15   Ripudeep Krystal Eaton, MD  polyethylene glycol (MIRALAX) packet Take 17 g by mouth daily. 07/16/15   Ripudeep Krystal Eaton, MD    Family History Family History  Problem Relation Age of Onset  . Heart disease Father   . Osteoarthritis Mother   . Hypertension Sister   . Hyperlipidemia      Social History Social History  Substance Use Topics  . Smoking status: Former Smoker    Quit date: 09/30/1971  . Smokeless tobacco: Never Used  . Alcohol use No     Allergies   Spironolactone; Avodart [dutasteride]; Avodart [dutasteride]; Codeine; Diazepam; Diazepam; Doxazosin; Doxazosin; Feraheme [ferumoxytol]; and Spironolactone   Review of Systems Review of Systems  All other systems reviewed and are negative.    Physical Exam Updated Vital Signs BP 103/61   Pulse 91   Temp 98.1 F (36.7 C) (Rectal)   Resp 22   SpO2 97%   Physical Exam  Constitutional: He appears well-developed and well-nourished. No distress.  Patient is very pale  HENT:  Head: Normocephalic and atraumatic.  Right Ear: External ear normal.  Left Ear: External ear normal.  Nose: Nose normal.  Mouth/Throat: Oropharynx is clear and moist.  Eyes: Pupils are equal, round, and reactive to light.  Conjunctiva pale  Neck:  Collar in place with some diffuse  posterior tenderness. No other signs of trauma and no step-offs noted  Cardiovascular: Normal rate and regular rhythm.   Pulmonary/Chest: Effort normal and breath sounds normal.  Abdominal: Soft.  Musculoskeletal: Normal range of motion. He exhibits no deformity.  Neurological: He is alert. He has normal reflexes.  Skin: Skin is warm and dry.  Nursing note and vitals reviewed.    ED Treatments / Results  Labs (all labs ordered are listed, but only abnormal results are displayed) Labs Reviewed  BASIC METABOLIC PANEL - Abnormal; Notable for the following:       Result Value   Sodium 133 (*)    Glucose, Bld 252 (*)    BUN 27 (*)    All other components within normal limits  CBC WITH DIFFERENTIAL/PLATELET - Abnormal; Notable for the following:    RBC 2.24 (*)    Hemoglobin 6.0 (*)    HCT 20.2 (*)    MCHC 29.7 (*)    RDW 18.0 (*)    Platelets 134 (*)    All other components within normal limits  CBG MONITORING, ED - Abnormal; Notable for the following:    Glucose-Capillary 233 (*)    All other components within normal limits  I-STAT CHEM 8, ED - Abnormal; Notable for the following:    Chloride 97 (*)    BUN 32 (*)    Creatinine, Ser 0.60 (*)    Glucose, Bld 235 (*)    Calcium, Ion 1.33 (*)    Hemoglobin 7.1 (*)    HCT 21.0 (*)    All other components within normal limits  URINALYSIS, ROUTINE W REFLEX MICROSCOPIC (NOT AT [facility])  PROTIME-INR  POC OCCULT BLOOD, ED  I-STAT CG4 LACTIC ACID, ED  TYPE AND SCREEN  PREPARE RBC (CROSSMATCH)    EKG  EKG Interpretation  Date/Time:  Thursday September 24 2015 14:38:14 EDT Ventricular Rate:  89 PR Interval:    QRS Duration: 96 QT Interval:  415 QTC Calculation: 505 R Axis:   -50 Text Interpretation:  Sinus rhythm Atrial premature complexes Left anterior fascicular block Abnormal R-wave progression, late transition Minimal ST depression, inferior leads Prolonged QT interval No significant change since last tracing Confirmed by Tammee Thielke  MD, Andee Poles [phone]) on 09/24/2015 4:25:23 PM       Radiology Ct Head Wo Contrast  Result Date: 09/24/2015 CLINICAL DATA:  Syncope. EXAM: CT HEAD WITHOUT CONTRAST CT CERVICAL SPINE WITHOUT CONTRAST TECHNIQUE: Multidetector CT imaging of the head and cervical spine was performed following the standard protocol without intravenous contrast. Multiplanar CT image reconstructions of the cervical spine were also generated. COMPARISON:  None. FINDINGS: CT HEAD FINDINGS There is mild generalized age related parenchymal atrophy with commensurate dilatation of the sulci. No hydrocephalus. There is no mass, hemorrhage, edema or other evidence of acute parenchymal abnormality. No extra-axial hemorrhage. There are chronic calcified atherosclerotic changes of the large vessels at the skull base. No osseous fracture or dislocation seen. No evidence of acute sinusitis. Mastoid air cells are clear. CT CERVICAL SPINE FINDINGS Degenerative changes are seen throughout the cervical spine, mild to moderate in degree, with associated disc space narrowings and osseous spurring. Associated disc-osteophytic bulges are seen at the C3-4 through C7-T1 levels causing mild central canal stenoses. Additional degenerative hypertrophy of the uncovertebral and facet joints are causing moderate to severe neural foramen stenoses at the C3-4, C4-5 and C6-7 levels with possible associated nerve root impingements. Slight reversal of the normal cervical spine lordosis  is likely related to these underlying degenerative changes. No fracture line or displaced fracture fragment identified. Facet joints appear normally aligned throughout. Paravertebral soft tissues are unremarkable. IMPRESSION: 1. No acute intracranial abnormality. No intracranial mass, hemorrhage or edema. No skull fracture. 2. No fracture or acute subluxation identified in the cervical spine. Degenerative changes of the cervical spine, as detailed above. Electronically Signed   By:  Franki Cabot M.D.   On: 09/24/2015 17:10   Ct Cervical Spine Wo Contrast  Result Date: 09/24/2015 CLINICAL DATA:  Syncope. EXAM: CT HEAD WITHOUT CONTRAST CT CERVICAL SPINE WITHOUT CONTRAST TECHNIQUE: Multidetector CT imaging of the head and cervical spine was performed following the standard protocol without intravenous contrast. Multiplanar CT image reconstructions of the cervical spine were also generated. COMPARISON:  None. FINDINGS: CT HEAD FINDINGS There is mild generalized age related parenchymal atrophy with commensurate dilatation of the sulci. No hydrocephalus. There is no mass, hemorrhage, edema or other evidence of acute parenchymal abnormality. No extra-axial hemorrhage. There are chronic calcified atherosclerotic changes of the large vessels at the skull base. No osseous fracture or dislocation seen. No evidence of acute sinusitis. Mastoid air cells are clear. CT CERVICAL SPINE FINDINGS Degenerative changes are seen throughout the cervical spine, mild to moderate in degree, with associated disc space narrowings and osseous spurring. Associated disc-osteophytic bulges are seen at the C3-4 through C7-T1 levels causing mild central canal stenoses. Additional degenerative hypertrophy of the uncovertebral and facet joints are causing moderate to severe neural foramen stenoses at the C3-4, C4-5 and C6-7 levels with possible associated nerve root impingements. Slight reversal of the normal cervical spine lordosis is likely related to these underlying degenerative changes. No fracture line or displaced fracture fragment identified. Facet joints appear normally aligned throughout. Paravertebral soft tissues are unremarkable. IMPRESSION: 1. No acute intracranial abnormality. No intracranial mass, hemorrhage or edema. No skull fracture. 2. No fracture or acute subluxation identified in the cervical spine. Degenerative changes of the cervical spine, as detailed above. Electronically Signed   By: Franki Cabot  M.D.   On: 09/24/2015 17:10   Dg Chest Portable 1 View  Result Date: 09/24/2015 CLINICAL DATA:  Near syncope EXAM: PORTABLE CHEST 1 VIEW COMPARISON:  07/16/2015 FINDINGS: Cardiomediastinal silhouette is stable. Status post median sternotomy. There is chronic elevation of the right hemidiaphragm. No infiltrate or pulmonary edema. IMPRESSION: No active disease. Again noted status post median sternotomy and cardiac valve replacement. Chronic elevation of the right hemidiaphragm. Electronically Signed   By: Lahoma Crocker M.D.   On: 09/24/2015 17:20    Procedures Procedures (including critical care time)  Medications Ordered in ED Medications  0.9 %  sodium chloride infusion (not administered)     Initial Impression / Assessment and Plan / ED Course  I have reviewed the triage vital signs and the nursing notes.  Pertinent labs & imaging results that were available during my care of the patient were reviewed by me and considered in my medical decision making (see chart for details).  Clinical Course  Comment By Time  No s/s of infection Pattricia Boss, MD 08/17 1723  Patient with blood ordered on presentation . Awaiting irradiated blood from Heron Nay, MD 08/17 1724  1- acute anemia -patient with multiple myeloma with known anemia, with last prior of 7.1 and now 6.0. Patient hemocult positive. 2- gi bleed- stool positive- iv pepcid ordered  3- hypotension- likely secondary to 1 and 2, lactate elevated, no fever or complaints of infectious symtpoms.  4- fall with neck pain- ct head and neck negative for acute injury  Final Clinical Impressions(s) / ED Diagnoses   Final diagnoses:  Syncope and collapse  Rectal bleeding  Hypotension, unspecified hypotension type    New Prescriptions New Prescriptions   No medications on file    CRITICAL CARE Performed by: Shaune Pollack Total critical care time: 60 minutes Critical care time was exclusive of separately billable procedures and  treating other patients. Critical care was necessary to treat or prevent imminent or life-threatening deterioration. Critical care was time spent personally by me on the following activities: development of treatment plan with patient and/or surrogate as well as nursing, discussions with consultants, evaluation of patient's response to treatment, examination of patient, obtaining history from patient or surrogate, ordering and performing treatments and interventions, ordering and review of laboratory studies, ordering and review of radiographic studies, pulse oximetry and re-evaluation of patient's condition.    Pattricia Boss, MD 09/24/15 [phone]

## 2015-09-24 NOTE — H&P (Signed)
Date: 09/24/2015               Patient Name:  [name] MRN: [id number]  DOB: 27-Apr-1945 Age / Sex: 70 y.o., male   PCP: Buzzy Han, MD         Medical Service: Internal Medicine Teaching Service         Attending Physician: Dr. Aldine Contes, MD    First Contact: Amado Coe Pager: [phone]  Second Contact: Dr. Genene Churn Pager: [phone]       After Hours (After 5p/  First Contact Pager: [phone]  weekends / holidays): Second Contact Pager: [phone]   Chief Complaint: pre-syncope, GI bleed  History of Present Illness:    70 yo male with hx of Multiple myeloma on chemo, chronic anemia 2/2 to MM, aortic valve replacement (not on anticoag), chronic GI bleed, presents with pre-syncope while shopping at [employer] with his wife. He fell very hot throughout the day when he was outside getting hair cut before this. He was feeling dizzy and lightheaded at [employer] and was about to fall down when his wife helped me slowly sit on the floor. On the process, he hit his head against the shelves, and lied down on the floor. Did not have chest pain, palpitations, or any other symptoms at this time. No vertigo. The store staff called EMS and he was brought here. He fell backward and hit his head, CT head was normal.   He has been having chronic GI bleed (no melena, but has chronic blood mixed with stool), which he thinks is from his hemorrhoids. His hgb was found to be 6.0 on admission, had black tarry stool on exam,  FOBT + Baseline hgb around 7-8. He gets blood transfusion weekly outpatient. Saw GI outpatient after previous admission on June for GI bleed, discussed banding, but decided that treating MM is more important before addressing his GI bleed per patient.   BP was low in 86/48, improved with fluid in the ED. +Orthostats SBP 109 to 86. 2 units of PRBC was ordered by ED provider.  Denies any other symptoms such as abd pain, n/v, dysuria, cough, fever, chills, chest  pain.  Gets all of his care at the [place] [facility].    Meds:  Current Meds  Medication Sig  . acyclovir (ZOVIRAX) 800 MG tablet Take 0.5 tablets by mouth 2 (two) times daily.  Marland Kitchen aspirin EC 81 MG tablet Take 81 mg by mouth daily.  . carbidopa-levodopa (SINEMET IR) 25-100 MG tablet Take 3 tablets by mouth 4 (four) times daily.   . carboxymethylcellulose 1 % ophthalmic solution Apply 1 drop to eye 3 (three) times daily as needed (dry eyes).  . cetirizine (ZYRTEC) 10 MG chewable tablet Chew 10 mg by mouth 2 (two) times daily as needed for other. At the onset of tongue swelling/angioedema  . cyclobenzaprine (FLEXERIL) 10 MG tablet Take 5 mg by mouth 3 (three) times daily as needed for muscle spasms.  Marland Kitchen dexamethasone (DECADRON) 4 MG tablet Take 40 mg by mouth once a week. Take ten tablets (72m) every Wednesday with pomalidomide.  . diazepam (VALIUM) 2 MG tablet Take 2 mg by mouth at bedtime as needed for other. Restless leg  . docusate sodium (COLACE) 100 MG capsule Take 100 mg by mouth 2 (two) times daily.  . finasteride (PROSCAR) 5 MG tablet Take 5 mg by mouth daily.  . fish oil-omega-3 fatty acids 1000 MG capsule Take 1 g by mouth  daily.   . folic acid (FOLVITE) 1 MG tablet Take 1 mg by mouth daily.  Marland Kitchen gabapentin (NEURONTIN) 800 MG tablet Take 800 mg by mouth 3 (three) times daily.  . iron sucrose (VENOFER) 20 MG/ML injection Inject 200 mg into the vein once a week.  Marland Kitchen ketoconazole (NIZORAL) 2 % cream Apply 1 application topically daily as needed for irritation.  . metFORMIN (GLUCOPHAGE) 500 MG tablet Take 500 mg by mouth 2 (two) times daily with a meal.  . methylcellulose (ARTIFICIAL TEARS) 1 % ophthalmic solution Place 1 drop into both eyes at bedtime as needed (dry eyes).  . pantoprazole (PROTONIX) 40 MG tablet Take 1 tablet (40 mg total) by mouth daily. For GERD  . polycarbophil (FIBERCON) 625 MG tablet Take 2 tablets by mouth daily.  . pomalidomide (POMALYST) 4 MG capsule Take 4 mg  by mouth daily. Take with water on days 1-21. Repeat every 28 days.  . pravastatin (PRAVACHOL) 40 MG tablet Take 1 tablet (40 mg total) by mouth every evening.  . simethicone (MYLICON) 80 MG chewable tablet Chew 80 mg by mouth every 6 (six) hours as needed for flatulence.  . simvastatin (ZOCOR) 40 MG tablet Take 20 mg by mouth daily.  Marland Kitchen zinc oxide 20 % ointment Apply 1 application topically 2 (two) times daily as needed for irritation.  . [DISCONTINUED] docusate sodium (COLACE) 100 MG capsule Take 100 mg by mouth 2 (two) times daily.     Allergies: Allergies as of 09/24/2015 - Review Complete 09/24/2015  Allergen Reaction Noted  . Spironolactone Other (See Comments) 01/31/2015  . Avodart [dutasteride] Other (See Comments)   . Avodart [dutasteride] Other (See Comments) 01/31/2015  . Codeine Other (See Comments)   . Diazepam  12/27/2012  . Diazepam Other (See Comments) 01/31/2015  . Doxazosin  12/27/2012  . Doxazosin Other (See Comments) 01/31/2015  . Feraheme [ferumoxytol] Swelling 11/15/2011  . Spironolactone  12/27/2012   Past Medical History:  Diagnosis Date  . Allergic rhinitis   . Anemia 2009  . Aortic stenosis    a. s/p porcine AVR 2006;  b. s/p redo tissue AVR 12/2011 (Dr. Roxy Manns) - preAVR LHC with no CAD  . Asthma   . BPH (benign prostatic hypertrophy)   . Cancer ([place])    multiple myeloma  . Cataract   . Chronic cough   . Chronic interstitial cystitis   . Depression    pt denies  . Diabetes mellitus    type 2  . Diabetes mellitus without complication ([place])   . Diverticulosis of colon    on colonoscopy 2008  . GERD (gastroesophageal reflux disease)    bravo pH study 2008  . Gout   . H/O aortic valve replacement with porcine valve    2006, 2013  . Headache(784.0)   . Hemorrhoids    external and internal  . Hiatal hernia   . HTN (hypertension)   . Hx of echocardiogram    a. Echo  (post AVR) 12/2011:  mod LVH, EF 60-65%, Gr 2 diast dysfn, mild AI, AVR ok (mean  gradient 19 mmHg), MAC, mild BAE  . Hyperlipidemia   . Hypertension   . Hyperthyroidism   . IBS (irritable bowel syndrome)   . Insomnia   . Lower GI bleed 07/16/2015  . OA (osteoarthritis)   . OSA (obstructive sleep apnea)    USES CPAP AS NEEDED  . Periodontitis    chronic with bone loss  . Restless leg syndrome   . S/P  aortic valve replacement with bioprosthetic valve 12/06/2011   Redo AVR using 23 mm [facility] Ease pericardial tissue valve    Family History: mother had OA, father had heart dz, sister has HTN.  Social History: previous smoker, quit on 1973. No alcohol, no drugs.  Review of Systems: Review of Systems  Constitutional: Negative for chills and fever.  Eyes: Negative for blurred vision and double vision.  Respiratory: Negative for cough, hemoptysis, sputum production and shortness of breath.   Cardiovascular: Negative for chest pain, palpitations and leg swelling.  Gastrointestinal: Positive for blood in stool. Negative for abdominal pain, diarrhea, heartburn, melena, nausea and vomiting.  Genitourinary: Negative for dysuria.  Neurological: Positive for dizziness. Negative for tingling and headaches.  Psychiatric/Behavioral: Negative for depression.     Physical Exam: Blood pressure 128/74, pulse 99, temperature 98.1 F (36.7 C), temperature source Rectal, resp. rate 18, SpO2 100 %.  Physical Exam  Constitutional: He is oriented to person, place, and time. He appears well-developed and well-nourished. No distress.  HENT:  Head: Normocephalic and atraumatic.  Dry mucous membrane  Eyes: Conjunctivae are normal. Right eye exhibits no discharge. Left eye exhibits no discharge.  Cardiovascular: Normal rate and normal heart sounds.   Systolic murmur heard best on RUSB  Respiratory: Effort normal and breath sounds normal. No respiratory distress. He has no wheezes.  Sternotomy scar on chest.   GI: Soft. Bowel sounds are normal. He exhibits no distension. There  is no tenderness.  Genitourinary:  Genitourinary Comments: Rectal exam revealed brown colored stool without any blood. Has rectal area skin tag. No hemorrhoids.   Musculoskeletal: Normal range of motion. He exhibits no edema or deformity.  Neurological: He is alert and oriented to person, place, and time. No cranial nerve deficit. Coordination normal.  Skin: He is not diaphoretic.    EKG: NSR. No ST changes.   CXR: sternotomy wire,  Elevated right hemidiaphgram. No acute changes.   CT head and Cscpine:   1. No acute intracranial abnormality. No intracranial mass, hemorrhage or edema. No skull fracture. 2. No fracture or acute subluxation identified in the cervical spine. Degenerative changes of the cervical spine, as detailed above.  Assessment & Plan by Problem: Active Problems:   Essential hypertension   GERD   H/O aortic valve replacement   Syncope   S/P aortic valve replacement with bioprosthetic valve   GI bleed  pre-syncope - secondary to hypovolemia + symptomatic anemia patient had no chest pain or palpitation. had prodrome of feeling lightheaded. BP was low on admission and + orthostatic vitals. appears to be dry on physical exam as well. all of these point to hypovolemia as the most likely cause. this is secondary to dehydration (low PO intake + hot weather) and also symtomatic anemia (hgb 6.0). - will replete fluid and also transfuse pRBC - assess for improvement. - monitor on tele, but very low suspcision of cardiogenic etiology.  Symptomatic Anemia - 2/2 to chronic GI bleed + Multiple Myeloma + chemotherapy Patient gets blood transffion outpatient every week due to this. His admission hgb was 6.0, baseline is 7-8.  - will transfuse 2 units per EDP order, will trend CBC. Will likely need intermittent transfusion outpatient.  Chronic GI bleed previous admission GI recommended banding of internal hemorrhoids. However, patient states that he followed up with GI  outpatient and they recommended deferring any procedure for now until he is stable from his multiple myeloma treatment. rectal exam showed brown stool, no melena or blood  currenlty.  was FOBT+ - ED consulted GI here, will follow up their recs  DM II - last hgba1c unknown, but has been in 7-8 range per patient - will hold metformin, will do SSI here  HTN - holding home meds in the setting of hypovolemia/hypotension   S/p prosthetic Aortic valve replacement - not on anticoag currenlty, only on aspirin. This was discussed with his cardiologist and the decision was made to be on aspirin. Will not do anticoag now anyway in the setting of GI bleed and chronic anemia  Dispo: Admit patient to Inpatient with expected length of stay greater than 2 midnights.  Signed: Dellia Nims, MD 09/24/2015, 5:51 PM  Pager: [phone]

## 2015-09-24 NOTE — ED Notes (Signed)
Pt requested assistance to restroom by wheelchair, refused bedpan or bedside commode. BP 109/59 and this RN agreed if pt could tolerate positional changes. When pt sat on edge of bed, became lightheaded and pale in color. Pt assisted back to laying down in stretcher, BP now 86/48 and hr 45-40bpm. Dr. Jeanell Sparrow aware and at bedside.

## 2015-09-24 NOTE — H&P (Signed)
Date: 09/24/2015               Patient Name:  [name] MRN: [id number]  DOB: 07/17/1945 Age / Sex: 70 y.o., male   PCP: Buzzy Han, MD         Medical Service: Internal Medicine Teaching Service         Attending Physician: Dr. Aldine Contes, MD    First Contact: Lutricia Horsfall, MS4 Pager: [phone]  Second Contact: Dr. Dellia Nims  Pager: [phone]       After Hours (After 5p/  First Contact Pager: [phone]  weekends / holidays): Second Contact Pager: [phone]   Chief Complaint: Presyncopal episode   History of Present Illness:  [name] is a 70 yo male with MM on pomalidomide and dexamethasone, aortic stenosis s/p aortic valve replacement with bioprosthetic valve 2006/2013, HTN, T2DM, HLD, and chronic anemia 2/2 MM who presents with his wife after a syncopal episode. Patient states he was at [employer] when he started feeling lightheaded. States the place was warm and he thinks it could have made him feel lightheaded. Denied N/V, diaphoresis, blurry vision, urinary/fecal incontinence, and palpitations at the time. Denied LOC. He hit his head with some shelves. Stated he felt better once he was laying flat on the floor. He was recently discharged on 07/16/2015 for lower GI bleed thought to be 2/2 anal fissure and internal hemorrhoids. He followed with GI as an outpatient. It was decided not to perform band ligation for his hemorrhoids due to ongoing cancer treatment. He reports he continues to have BRBPR. States bleeding has not increased. Denied melena, hematemesis, and hemoptysis. Per wife, he has a decreased appetite and skipped dinner last night because of a metallic taste in his mouth. Reports he has been having a headache. He is taking Ibuprofen for it.   ED course: He was orthostatic 109--> 86 on sitting. Hgb 6.0 and positive FOBT. He received 1unit pRBC. GI consutled. He was tachycardic to 105 and normotensive when seen. He was able to sit up  for physical exam without lightheadedness. BP 113/76 during exam.    ROS: Review of Systems  Constitutional: Negative for chills, diaphoresis, fever and weight loss.  Eyes: Negative for blurred vision and double vision.  Respiratory: Negative for cough and hemoptysis.   Cardiovascular: Negative for chest pain, palpitations and leg swelling.  Gastrointestinal: Positive for blood in stool. Negative for abdominal pain, constipation, diarrhea, melena, nausea and vomiting.  Genitourinary: Positive for frequency and urgency.  Musculoskeletal: Negative for myalgias.  Neurological: Positive for headaches. Negative for dizziness, focal weakness and loss of consciousness.    PMH: Past Medical History:  Diagnosis Date  . Allergic rhinitis   . Anemia 2009  . Aortic stenosis    a. s/p porcine AVR 2006;  b. s/p redo tissue AVR 12/2011 (Dr. Roxy Manns) - preAVR LHC with no CAD  . Asthma   . BPH (benign prostatic hypertrophy)   . Cancer ([place])    multiple myeloma  . Cataract   . Chronic cough   . Chronic interstitial cystitis   . Depression    pt denies  . Diabetes mellitus    type 2  . Diabetes mellitus without complication ([place])   . Diverticulosis of colon    on colonoscopy 2008  . GERD (gastroesophageal reflux disease)    bravo pH study 2008  . Gout   . H/O aortic valve replacement with porcine valve    2006,  2013  . Headache(784.0)   . Hemorrhoids    external and internal  . Hiatal hernia   . HTN (hypertension)   . Hx of echocardiogram    a. Echo  (post AVR) 12/2011:  mod LVH, EF 60-65%, Gr 2 diast dysfn, mild AI, AVR ok (mean gradient 19 mmHg), MAC, mild BAE  . Hyperlipidemia   . Hypertension   . Hyperthyroidism   . IBS (irritable bowel syndrome)   . Insomnia   . Lower GI bleed 07/16/2015  . OA (osteoarthritis)   . OSA (obstructive sleep apnea)    USES CPAP AS NEEDED  . Periodontitis    chronic with bone loss  . Restless leg syndrome   . S/P aortic valve replacement with  bioprosthetic valve 12/06/2011   Redo AVR using 23 mm [facility] Ease pericardial tissue valve    PSH: Past Surgical History:  Procedure Laterality Date  . AORTA - FEMORAL ARTERY BYPASS GRAFT    . AORTIC VALVE REPLACEMENT  10/13/2004   58m Edwards Perimount pericardial tissue valve  . AORTIC VALVE REPLACEMENT  12/06/2011   Procedure: REDO AORTIC VALVE REPLACEMENT (AVR);  Surgeon: CRexene Alberts MD;  [facility]  Service: Open Heart Surgery;  Laterality: N/A;  . BUNIONECTOMY     right  . CARDIAC SURGERY     aorta vavle replacement  . CATARACT EXTRACTION  2009    &   2012   BILATERAL  . COLONOSCOPY  08/31/2011   Procedure: COLONOSCOPY;  Surgeon: RInda Castle MD;  [place] [facility]  Service: Endoscopy;  Laterality: N/A;  . ESOPHAGOGASTRODUODENOSCOPY  08/30/2011   Procedure: ESOPHAGOGASTRODUODENOSCOPY (EGD);  Surgeon: RInda Castle MD;  [facility]  Service: Endoscopy;  Laterality: N/A;  Rm 3005   . HERNIA REPAIR    . JOINT REPLACEMENT     knee  . LEFT HEART CATHETERIZATION WITH CORONARY ANGIOGRAM N/A 11/30/2011   Procedure: LEFT HEART CATHETERIZATION WITH CORONARY ANGIOGRAM;  Surgeon: CBurnell Blanks MD;  [facility];  Service: Cardiovascular;  Laterality: N/A;  . NASAL SEPTOPLASTY W/ TURBINOPLASTY    . REFRACTIVE SURGERY     bilateral  . RIGHT HEART CATHETERIZATION Bilateral 11/30/2011   Procedure: RIGHT HEART CATH;  Surgeon: CBurnell Blanks MD;  [facility];  Service: Cardiovascular;  Laterality: Bilateral;  . right knee arthroscopy      Meds:  No current facility-administered medications on file prior to encounter.    Current Outpatient Prescriptions on File Prior to Encounter  Medication Sig Dispense Refill  . carbidopa-levodopa (SINEMET IR) 25-100 MG tablet Take 3 tablets by mouth 4 (four) times daily.     . carboxymethylcellulose 1 % ophthalmic solution Apply 1 drop to eye 3 (three) times daily as  needed (dry eyes).    . finasteride (PROSCAR) 5 MG tablet Take 5 mg by mouth daily.    . fish oil-omega-3 fatty acids 1000 MG capsule Take 1 g by mouth daily.     .Marland Kitchengabapentin (NEURONTIN) 800 MG tablet Take 800 mg by mouth 3 (three) times daily.    . metFORMIN (GLUCOPHAGE) 500 MG tablet Take 500 mg by mouth 2 (two) times daily with a meal.    . methylcellulose (ARTIFICIAL TEARS) 1 % ophthalmic solution Place 1 drop into both eyes at bedtime as needed (dry eyes).    . pantoprazole (PROTONIX) 40 MG tablet Take 1 tablet (40 mg total) by mouth daily. For GERD 30 tablet 3  .  pravastatin (PRAVACHOL) 40 MG tablet Take 1 tablet (40 mg total) by mouth every evening. 30 tablet 11  . amLODipine (NORVASC) 5 MG tablet Take 1 tablet (5 mg total) by mouth daily. 30 tablet 11  . Cholecalciferol (VITAMIN D) 2000 units CAPS Take 1 capsule (2,000 Units total) by mouth daily. Reported on 07/16/2015 30 capsule 3  . ferrous sulfate 325 (65 FE) MG tablet Take 1 tablet (325 mg total) by mouth at bedtime. Reported on 07/16/2015 30 tablet 3  . metoprolol (LOPRESSOR) 50 MG tablet Take 0.5 tablets (25 mg total) by mouth 2 (two) times daily. 60 tablet 4  . polyethylene glycol (MIRALAX) packet Take 17 g by mouth daily. 30 each 3    Allergies: Allergies as of 09/24/2015 - Review Complete 09/24/2015  Allergen Reaction Noted  . Spironolactone Other (See Comments) 01/31/2015  . Avodart [dutasteride] Other (See Comments)   . Avodart [dutasteride] Other (See Comments) 01/31/2015  . Codeine Other (See Comments)   . Diazepam  12/27/2012  . Diazepam Other (See Comments) 01/31/2015  . Doxazosin  12/27/2012  . Doxazosin Other (See Comments) 01/31/2015  . Feraheme [ferumoxytol] Swelling 11/15/2011  . Spironolactone  12/27/2012   Family History:  Family History  Problem Relation Age of Onset  . Heart disease Father   . Osteoarthritis Mother   . Hypertension Sister   . Hyperlipidemia     Social History: Lives at home with  wife. Former smoker, quit in 1973. Denied alcohol and drug use.   Physical Exam: Blood pressure 104/69, pulse 101, temperature 98.6 F (37 C), resp. rate 18, SpO2 99 %.  General: lying flat in bed, comfortable, in NAD  HEENT: NCAT, dry mucus membranes  CV: RRR, pansystolic murmur best heard on LUSB Chest: CTAB, no wheezes or crackles, no increased work of breathing  Abd: soft, NTND, no rebound or guarding  Rectal: performed by Dr. Genene Churn  Extremities: warm and well perfused, no cyanosis or edema  Skin: no rashes or lesions  MSK: MCP synovitis bilaterally, diffuse swan deformities  Neuro: A&Ox4, no focal deficits   Labs/Studies:    Ref. Range 09/24/2015 15:55  WBC Latest Ref Range: 4.0 - 10.5 K/uL 7.6  RBC Latest Ref Range: 4.22 - 5.81 MIL/uL 2.24 (L)  Hemoglobin Latest Ref Range: 13.0 - 17.0 g/dL 6.0 (LL)  HCT Latest Ref Range: 39.0 - 52.0 % 20.2 (L)  MCV Latest Ref Range: 78.0 - 100.0 fL 90.2  MCH Latest Ref Range: 26.0 - 34.0 pg 26.8  MCHC Latest Ref Range: 30.0 - 36.0 g/dL 29.7 (L)  RDW Latest Ref Range: 11.5 - 15.5 % 18.0 (H)  Platelets Latest Ref Range: 150 - 400 K/uL 134 (L)    Ref. Range 09/24/2015 14:50  Sodium Latest Ref Range: 135 - 145 mmol/L 133 (L)  Potassium Latest Ref Range: 3.5 - 5.1 mmol/L 4.1  Chloride Latest Ref Range: 101 - 111 mmol/L 102  CO2 Latest Ref Range: 22 - 32 mmol/L 24  BUN Latest Ref Range: 6 - 20 mg/dL 27 (H)  Creatinine Latest Ref Range: 0.61 - 1.24 mg/dL 0.71  Calcium Latest Ref Range: 8.9 - 10.3 mg/dL 9.9  EGFR (Non-African Amer.) Latest Ref Range: >60 mL/min >60  EGFR (African American) Latest Ref Range: >60 mL/min >60  Glucose Latest Ref Range: 65 - 99 mg/dL 252 (H)  Anion gap Latest Ref Range: 5 - 15  7   FOBT positive  Lactic Acid 2.39  EKG: On my review: HR 89, sinus rhythm,  ST depression on inferior leads, no T wave abnormalities, poor R wave progression,  QTc 505  CXR: Cardiomediastinal silhouette is stable. Median sternotomy.  There is chronic elevation of the right hemidiaphragm. No infiltrate or pulmonary edema.  Head CT: No acute intracranial abnormality. No intracranial mass, hemorrhage or edema. No skull fracture. No fracture or acute subluxation identified in the cervical spine. Degenerative changes of the cervical spine, as detailed above.  Assessment & Plan by Problem: Active Problems:   Essential hypertension   GERD   H/O aortic valve replacement   Syncope   S/P aortic valve replacement with bioprosthetic valve   GI bleed  [name]  with MM on pomalidomide and dexamethasone, aortic stenosis s/p aortic valve replacement with bioprosthetic valve 2006/2013, HTN, T2DM, HLD, and chronic anemia 2/2 MM who presented with a presyncopal episode after feeling lightheaded in the setting active cancer on chemo, chronic anemia, and chronic lower GI bleed 2/2 internal anal fissure and internal hemorrhoids. His Hgb was 6.0 on admission. He is now s/p 1 unit pRBCs. GI consulted by ED provider. Low suspicion for cardiac etiology given no cardiac symptoms and no abnormal rhythm on EKG, however can not r/o. He did have minor ST depressions on inferior leads, but low suspicion for ACS at this time.   1. Presyncopal episode: Patient reports sudden lightheadedness with no other associated symptoms. Likely multifactorial as described above.  - s/p 1 unit pRBCs + IVF in ED  - F/u CBC in AM  - Holding home antihypertensives  - GI consulted, appreciate recommendations   2. Chronic lower GI bleeding: recently discharged 07/2015 for lower GI bleed 2/2 internal anal fissure and hemorrhoids. Continues to have BRBPR.  - On chronic transfusion as outpatient  - Positive FOBT in ED  - GI consulted as above   3. Multiple myeloma: diagnosed in 02/2015. On pomalidomide and dexamethasone.  - Continue home chemotherapy  4. Chronic anemia:  - Continue home iron  - F/u CBC in AM   5. CAD s/p aortic valve replacement:  -  Not on anticoagulation. Per patient and wife, his cardiologist started him on aspirin.   6. T2DM: A1c unknown - Holding home metformin  - SSI   7. HTN: on amlodipine and metoprolol at home  - Holding home antihypertensives   8. HLD:  - Continue home pravastatin and simvastatin   9. GERD:  - Continue home protonix    Dispo: Admit patient to Inpatient with expected length of stay less than 2 days   Signed: Welford Roche, Medical Student 09/24/2015, 7:08 PM  Pager: [phone]

## 2015-09-24 NOTE — ED Notes (Signed)
[name]; Transporter, transporting pt to X-RAY.

## 2015-09-25 ENCOUNTER — Encounter (HOSPITAL_COMMUNITY): Payer: Self-pay

## 2015-09-25 DIAGNOSIS — K601 Chronic anal fissure: Secondary | ICD-10-CM

## 2015-09-25 DIAGNOSIS — I951 Orthostatic hypotension: Secondary | ICD-10-CM

## 2015-09-25 DIAGNOSIS — E119 Type 2 diabetes mellitus without complications: Secondary | ICD-10-CM

## 2015-09-25 DIAGNOSIS — K922 Gastrointestinal hemorrhage, unspecified: Principal | ICD-10-CM

## 2015-09-25 DIAGNOSIS — D649 Anemia, unspecified: Secondary | ICD-10-CM

## 2015-09-25 DIAGNOSIS — K219 Gastro-esophageal reflux disease without esophagitis: Secondary | ICD-10-CM

## 2015-09-25 DIAGNOSIS — C9000 Multiple myeloma not having achieved remission: Secondary | ICD-10-CM

## 2015-09-25 DIAGNOSIS — K625 Hemorrhage of anus and rectum: Secondary | ICD-10-CM

## 2015-09-25 DIAGNOSIS — I10 Essential (primary) hypertension: Secondary | ICD-10-CM

## 2015-09-25 DIAGNOSIS — E785 Hyperlipidemia, unspecified: Secondary | ICD-10-CM

## 2015-09-25 DIAGNOSIS — E861 Hypovolemia: Secondary | ICD-10-CM

## 2015-09-25 DIAGNOSIS — K648 Other hemorrhoids: Secondary | ICD-10-CM

## 2015-09-25 LAB — CBC
HEMATOCRIT: 23.5 % — AB (ref 39.0–52.0)
HEMOGLOBIN: 7.3 g/dL — AB (ref 13.0–17.0)
MCH: 27.8 pg (ref 26.0–34.0)
MCHC: 31.1 g/dL (ref 30.0–36.0)
MCV: 89.4 fL (ref 78.0–100.0)
Platelets: 141 10*3/uL — ABNORMAL LOW (ref 150–400)
RBC: 2.63 MIL/uL — ABNORMAL LOW (ref 4.22–5.81)
RDW: 17.5 % — ABNORMAL HIGH (ref 11.5–15.5)
WBC: 7 10*3/uL (ref 4.0–10.5)

## 2015-09-25 LAB — PROTIME-INR
INR: 1.05
PROTHROMBIN TIME: 13.7 s (ref 11.4–15.2)

## 2015-09-25 LAB — GLUCOSE, CAPILLARY
GLUCOSE-CAPILLARY: 157 mg/dL — AB (ref 65–99)
Glucose-Capillary: 136 mg/dL — ABNORMAL HIGH (ref 65–99)

## 2015-09-25 LAB — MRSA PCR SCREENING: MRSA BY PCR: NEGATIVE

## 2015-09-25 MED ORDER — ACETAMINOPHEN 325 MG PO TABS
650.0000 mg | ORAL_TABLET | Freq: Four times a day (QID) | ORAL | Status: DC | PRN
Start: 2015-09-25 — End: 2015-09-25
  Administered 2015-09-25: 650 mg via ORAL
  Filled 2015-09-25: qty 2

## 2015-09-25 NOTE — Progress Notes (Signed)
  Date: 09/25/2015  Patient name: [name]  Medical record number: [id number]  Date of birth: 1945/12/30   I have seen and evaluated Earney Mallet and discussed their care with the Residency Team. In brief, patient is a 70 y/o male with PMH of multiple myeloma, chronic anemia secondary to MM and chronic GI bleed (hemorrhoids), s/p bioprosthetic AVR who p/w lightheadedness * 1 day. Patient was out with wife and became lightheaded and was about to fall down. Wife helped lower him to the ground and he felt better after lying dow. He did hit his head while being helped down. No fevers/chills, no CP, no abd pain, no n/v, no diarrhea, no syncope, no focal weakness, no tingling/numbness, no HA.  He has a history of chronic GI bleed from hemorrhoids and notes blood mixed with stool after a BM and some blood after wiping daily. He gets weekly blood transfusions as an outpatient.  In ED he was noted to be orthostatic and admitted for further w/u. He feels well today with no new complaints.    PMHx, Fam Hx, and/or Soc Hx : as per resident admit note  Vitals:   09/25/15 1000 09/25/15 1143  BP: 112/64 102/68  Pulse: 91 82  Resp: (!) 21 19  Temp:  97.7 F (36.5 C)   Gen:AAO*3, NAD CVS: RRR, normal heart sounds Lungs: CTA b/l Abd: soft, non tender, BS + Ext: no edema    Assessment and Plan: I have seen and evaluated the patient as outlined above. I agree with the formulated Assessment and Plan as detailed in the residents' note, with the following changes:   1. Pre syncope: - Patient admitted with sudden onset lightheadedness and found to be orthostatic with worsening anemia. Presyncope likely secondary to anemia.  - Symptoms now resolved s/p 2 units PRBC with improvement in Hg from 6.0 to 7.3 and orthostatics have normalized - GI f/u appreciated- no further inpatient w/u at this time - Patient to f/u with GI as an outpatient to discuss possible treatment of hemorrhoids - Patient stable  for d/c home today [facility] [place], MD 8/18/20172:16 PM

## 2015-09-25 NOTE — Discharge Summary (Signed)
Name: [name] MRN: [id number] DOB: 1945/05/05 70 y.o. PCP: Buzzy Han, MD  Date of Admission: 09/24/2015  2:36 PM Date of Discharge: 09/25/2015 Attending Physician: Aldine Contes, MD  Discharge Diagnosis: 1. Orthostatic hypotension secondary to hypovolemia  2. 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 Chronic anemia  4. Multiple myeloma  5. HTN  6. T2DM  7. HLD  8. GERD  Active Problems:   Essential hypertension   GERD   H/O aortic valve replacement   Syncope   S/P aortic valve replacement with bioprosthetic valve   GI bleed   Rectal bleeding   Internal bleeding hemorrhoids   Discharge Medications:   Medication List    STOP taking these medications   amLODipine 5 MG tablet Commonly known as:  NORVASC   aspirin EC 81 MG tablet   metoprolol 50 MG tablet Commonly known as:  LOPRESSOR     TAKE these medications   acyclovir 800 MG tablet Commonly known as:  ZOVIRAX Take 0.5 tablets by mouth 2 (two) times daily.   carbidopa-levodopa 25-100 MG tablet Commonly known as:  SINEMET IR Take 3 tablets by mouth 4 (four) times daily.   carboxymethylcellulose 1 % ophthalmic solution Apply 1 drop to eye 3 (three) times daily as needed (dry eyes).   cetirizine 10 MG chewable tablet Commonly known as:  ZYRTEC Chew 10 mg by mouth 2 (two) times daily as needed for other. At the onset of tongue swelling/angioedema   cyclobenzaprine 10 MG tablet Commonly known as:  FLEXERIL Take 5 mg by mouth 3 (three) times daily as needed for muscle spasms.   dexamethasone 4 MG tablet Commonly known as:  DECADRON Take 40 mg by mouth once a week. Take ten tablets (62m) every Wednesday with pomalidomide.   diazepam 2 MG tablet Commonly known as:  VALIUM Take 2 mg by mouth at bedtime as needed for other. Restless leg   docusate sodium 100 MG capsule Commonly known as:  COLACE Take 100 mg by mouth 2 (two) times daily.     ferrous sulfate 325 (65 FE) MG tablet Take 1 tablet (325 mg total) by mouth at bedtime. Reported on 07/16/2015   FIBERCON 625 MG tablet Generic drug:  polycarbophil Take 2 tablets by mouth daily.   finasteride 5 MG tablet Commonly known as:  PROSCAR Take 5 mg by mouth daily.   fish oil-omega-3 fatty acids 1000 MG capsule Take 1 g by mouth daily.   folic acid 1 MG tablet Commonly known as:  FOLVITE Take 1 mg by mouth daily.   gabapentin 800 MG tablet Commonly known as:  NEURONTIN Take 800 mg by mouth 3 (three) times daily.   iron sucrose 20 MG/ML injection Commonly known as:  VENOFER Inject 200 mg into the vein once a week.   ketoconazole 2 % cream Commonly known as:  NIZORAL Apply 1 application topically daily as needed for irritation.   metFORMIN 500 MG tablet Commonly known as:  GLUCOPHAGE Take 500 mg by mouth 2 (two) times daily with a meal.   methylcellulose 1 % ophthalmic solution Commonly known as:  ARTIFICIAL TEARS Place 1 drop into both eyes at bedtime as needed (dry eyes).   pantoprazole 40 MG tablet Commonly known as:  PROTONIX Take 1 tablet (40 mg total) by mouth daily. For GERD   polyethylene glycol packet Commonly known as:  MIRALAX Take 17 g by mouth daily.   pomalidomide 4 MG capsule Commonly known  as:  POMALYST Take 4 mg by mouth daily. Take with water on days 1-21. Repeat every 28 days.   pravastatin 40 MG tablet Commonly known as:  PRAVACHOL Take 1 tablet (40 mg total) by mouth every evening.   simethicone 80 MG chewable tablet Commonly known as:  MYLICON Chew 80 mg by mouth every 6 (six) hours as needed for flatulence.   simvastatin 40 MG tablet Commonly known as:  ZOCOR Take 20 mg by mouth daily.   Vitamin D 2000 units Caps Take 1 capsule (2,000 Units total) by mouth daily. Reported on 07/16/2015   zinc oxide 20 % ointment Apply 1 application topically 2 (two) times daily as needed for irritation.       Disposition and  follow-up:   DannyDanny Ray was discharged from [facility] in Good condition.  At the hospital follow up visit please address:  1.  Please assess for ongoing symptoms of lightheadedness and orthostatics during next visit. Please review medication with patient as patient on two different statins curently.   2.  Labs / imaging needed at time of follow-up: Please repeat CBC   3.  Pending labs/ test needing follow-up: none   Follow-up Appointments: Patient has follow up appointment with GI at [facility] in [place] by problem list: Active Problems:   Essential hypertension   GERD   H/O aortic valve replacement   Syncope   S/P aortic valve replacement with bioprosthetic valve   GI bleed   Rectal bleeding   Internal bleeding hemorrhoids   [name] is a 70 yo M with history of multiple myeloma on pomalidomide and dexametasone, aortic stenosis s/p aortic valve replacement, HTN, T2DM, and HLD who presented with lightheadedness in the setting of chronic GI bleed and chronic anemia concerning for orthostatic hypotension secondary to hypovolemia.   1. Orthostatic hypotension secondary to hypovolemia: Patient presented with lightheadedness in the setting of multiple myeloma on active treatment, chronic anemia, and chronic GI bleed. He was tachycardic and orthostatic with sBP 109-->86 in the ED. He received 2 units of pRBCs with improvement in Hgb 6.0--> 7.3. Patient lightheadedness improved after blood transfusion and we asked to go home and follow up with his regular providers. His orthostatic on day of discharge were 102/68 supine --> 105/72 standing.   2. 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 Continued to report BRBPR during this admission, although not increased from baseline. He was last admitted in 07/2015 for lower GI bleed. He followed up with GI outpatient for possible  band ligation of internal hemorrhoids. However, it was decided not to pursue treatment at the time as he is currently on treatment from multiple myeloma and they did not want to increase risk of infection in an immunocompromised patient. GI consulted during this admission, no work up performed. He has a follow up appointment with GI at [facility] in [place].   3. Chronic anemia: This is secondary to multiple myeloma and chronic lower GI bleed. Hgb 6.0 on admission. Hgb 7.3 after 2 units pRBCs.   4. Multiple myeloma: Diagnosed in 02/2015. Currently on pomalidomide and dexamethasone.  His home medications were continued.   5. HTN: Home antihypertensive medications were held given orthostatic hypotension. Medications were not restarted on discharge given soft blood pressure.   6. T2DM: Home metformin held. On sliding scale during this admission. Metformin restarted on discharge.   7. HLD: Patient continued  on pravastatin during this admission. He was asked to discuss with his pcp about which to continue (simvastatin or pravastatin)  8. GERD: Home protonix continued.   Discharge Vitals:   BP 102/68 (BP Location: Right Arm)   Pulse 82   Temp 97.7 F (36.5 C) (Oral)   Resp 19   SpO2 98%   Pertinent Labs, Studies, and Procedures:    Discharge Instructions: Discharge Instructions    Diet - low sodium heart healthy    Complete by:  As directed   Discharge instructions    Complete by:  As directed   Please talk to your primary care about your medications.  We stopped all of your blood pressure medicines as your blood pressure in the hospital was low. Discuss with your regular doctor about your medications when you follow up.  Please discuss with your stomach doctor about the banding procedure. Please discuss with your doctor about which cholesterol medicine you should be on, you are currently taking the pravastatin and also simvastatin.   Increase activity slowly    Complete by:  As directed       Signed: Dellia Nims, MD 09/25/2015, 12:40 PM    [employer] student.

## 2015-09-25 NOTE — Progress Notes (Signed)
   Subjective:  No acute events overnight. Patient feeling better this morning. Denied lightheadedness, chest pain, and SOB. Would like to go home today.    Objective:  Vital signs in last 24 hours: Vitals:   09/25/15 0700 09/25/15 0800 09/25/15 1000 09/25/15 1143  BP: 103/65 98/74 112/64 102/68  Pulse: 81 91 91 82  Resp: 15 20 (!) 21 19  Temp:  98.7 F (37.1 C)  97.7 F (36.5 C)  TempSrc:  Oral  Oral  SpO2: 94% 99% 100% 98%   General: lying flat in bed, comfortable, in NAD  CV: RRR, pansystolic murmur best heard on LUSB Chest: CTAB, no wheezes or crackles, no increased work of breathing  Abd: soft, NTND, no rebound or guarding  Extremities: warm and well perfused, no cyanosis or edema  Skin: no rashes or lesions  MSK: MCP synovitis bilaterally, diffuse swan deformities  Neuro: A&Ox4, no focal deficits   Assessment/Plan:  Active Problems:   Essential hypertension   GERD   H/O aortic valve replacement   Syncope   S/P aortic valve replacement with bioprosthetic valve   GI bleed   Rectal bleeding   Internal bleeding hemorrhoids  Mr. Tanney is a 70 yo M  with MM on pomalidomide and dexamethasone, aortic stenosis s/p aortic valve replacement with bioprosthetic valve 2006/2013, HTN, T2DM, HLD, and chronic anemia 2/2 MM who presented with orthostatic hypotension 2/2 hypovolemia in the setting active cancer on chemo, chronic anemia, and chronic lower GI bleed 2/2 internal anal fissure and internal hemorrhoids. Hgb 6.0--> 7.3 after 2u of pRBCs. GI consulted and recommended no workup at this time. Patient feeling better and not orthostatic today. Will be discharged home and will follow up with his regular provider at the [place] in [place].   1. Presyncopal episode: Patient reports sudden lightheadedness with no other associated symptoms. Likely multifactorial as described above.  - s/p 2 unit pRBCs + IVF in ED  - Hgb 6.0--> 7.3 - Holding home antihypertensives  - GI consulted-->  no workup needed   2. Chronic lower GI bleeding: recently discharged 07/2015 for lower GI bleed 2/2 internal anal fissure and hemorrhoids. Continues to have BRBPR.  - On chronic transfusion as outpatient  - Positive FOBT in ED  - GI consulted as above    3. Multiple myeloma: diagnosed in 02/2015. On pomalidomide and dexamethasone.  - Continue home chemotherapy  4. Chronic anemia: Hgb 6.0--> 7.3 s/p 2u pRBCs - Continue home iron  - On chronic transfusion as outpatient   5. CAD s/p aortic valve replacement:  - On aspirin   6. T2DM: A1c unknown - Holding home metformin  - SSI   7. HTN: on amlodipine and metoprolol at home  - Holding home antihypertensives   8. HLD:  - Continue home pravastatin, d/c simvastatin  - On 2 statins on admission, unclear why    Dispo: Anticipated discharge today.   LOS: 1 day   Welford Roche, Medical Student 09/25/2015, 1:30 PM Pager: [phone]

## 2015-09-25 NOTE — Plan of Care (Signed)
Problem: Safety: Goal: Ability to remain free from injury will improve Outcome: Progressing Pt was resistant to using the bedside commode on admission.  After discussing the patients syncopal episode he was agreeable to allowing nursing staff to assist him with mobility and bathroom visits. However he is still impulsive and diminishes the fall and syncopal episode that brought him in.

## 2015-09-25 NOTE — Consult Note (Signed)
[facility] Consult: 8:21 AM 09/25/2015  LOS: 1 day    Referring Provider: Dr Dareen Piano.   Primary Care Physician:  Buzzy Han, MD, [facility] Primary Gastroenterologist:  Dr. Carlean Purl     Reason for Consultation:  Anemia, bleeding PR   HPI: [name] is a 70 y.o. male.  multiple myeloma on Velcade was being considered for stem cell therapy. Hypertension.  Hemorrhoids.  GEERD controlled with Protonix.  BPH.  S/p Ao valve replacement 2006, redo 2013.  Recent abx for epididymitis. Chronic anemia with transfusions of at least 17 PRBCs since 03/2015 and weekly for a few months (last on 09/18/15).  Hgb not > 7 for many years.  2008 Bravo pH study 08/31/11-EGD-Dr. Deatra Ina: normal 08/31/11 Colonoscopy -Dr. Deatra Ina: Diverticula in Danny transverse colon, internal hemorrhoids and otherwise normal exam 10/21/11-After above Capsule endoscopy was discussed, but later declined by Danny [name] after discussion with Dr. Carlean Purl and low potential for malignancy, [name] placed on lifelong iron and periodic CBC  07/16/15 GI hospital consult with Dr Carlean Purl for rectal bleeding and Hgb 6.6. MD attributed bleeding to hemorrhoids and chronic anemia from MM.  Anal fissure on exam.  [name] cancelled 7/5 outpt GI Ov with possible hemorrhoidal banding.   Started on study chemo drug ( in place of stem cell therapy) on 8/16, took this in AM on 8/17 and went shopping.  Near syncope at store yesterday.  BP 80/50.  In ED Hgb 6, now 7.3 post PRBC x 2  BUN elevated at 27.. 32.  Was 10 on 6/8.  Creatinine only slightly up c/w baseline. Lactate elevted.   Taking Ibuprofen 110 to 200 mg daily for headaches. Has ongoing BPR, this has not accelerated and occurs as blood with wiping 2 to 3 x per week.  Has formed stools 3 times per week or less,  has fecal seepage of liquid brown stool. Appetite diminished due to metallic taste in mouth.  Has not seen melena, no melena on digital rectal exam yesterday.  No N/V  .           Past Medical History:  Diagnosis Date  . Allergic rhinitis   . Anemia 2009  . Aortic stenosis    a. s/p porcine AVR 2006;  b. s/p redo tissue AVR 12/2011 (Dr. Roxy Manns) - preAVR LHC with no CAD  . Asthma   . BPH (benign prostatic hypertrophy)   . Cancer ([place])    multiple myeloma  . Cataract   . Chronic cough   . Chronic interstitial cystitis   . Depression    [name] denies  . Diabetes mellitus    type 2  . Diabetes mellitus without complication ([place])   . Diverticulosis of colon    on colonoscopy 2008  . GERD (gastroesophageal reflux disease)    bravo pH study 2008  . Gout   . H/O aortic valve replacement with porcine valve    2006, 2013  . Headache(784.0)   . Hemorrhoids    external and internal  . Hiatal hernia   . HTN (hypertension)   .  Hx of echocardiogram    a. Echo  (post AVR) 12/2011:  mod LVH, EF 60-65%, Gr 2 diast dysfn, mild AI, AVR ok (mean gradient 19 mmHg), MAC, mild BAE  . Hyperlipidemia   . Hypertension   . Hyperthyroidism   . IBS (irritable bowel syndrome)   . Insomnia   . Lower GI bleed 07/16/2015  . OA (osteoarthritis)   . OSA (obstructive sleep apnea)    USES CPAP AS NEEDED  . Periodontitis    chronic with bone loss  . Restless leg syndrome   . S/P aortic valve replacement with bioprosthetic valve 12/06/2011   Redo AVR using 23 mm [facility] Ease pericardial tissue valve    Past Surgical History:  Procedure Laterality Date  . AORTA - FEMORAL ARTERY BYPASS GRAFT    . AORTIC VALVE REPLACEMENT  10/13/2004   87m [employer] Perimount pericardial tissue valve  . AORTIC VALVE REPLACEMENT  12/06/2011   Procedure: REDO AORTIC VALVE REPLACEMENT (AVR);  Surgeon: CRexene Alberts MD;  [place] [facility]  Service: Open Heart Surgery;  Laterality: N/A;  . BUNIONECTOMY     right  .  CARDIAC SURGERY     aorta vavle replacement  . CATARACT EXTRACTION  2009    &   2012   BILATERAL  . COLONOSCOPY  08/31/2011   Procedure: COLONOSCOPY;  Surgeon: RInda Castle MD;  [place] [facility]  Service: Endoscopy;  Laterality: N/A;  . ESOPHAGOGASTRODUODENOSCOPY  08/30/2011   Procedure: ESOPHAGOGASTRODUODENOSCOPY (EGD);  Surgeon: RInda Castle MD;  [place] [facility]  Service: Endoscopy;  Laterality: N/A;  Rm 3005   . HERNIA REPAIR    . JOINT REPLACEMENT     knee  . LEFT HEART CATHETERIZATION WITH CORONARY ANGIOGRAM N/A 11/30/2011   Procedure: LEFT HEART CATHETERIZATION WITH CORONARY ANGIOGRAM;  Surgeon: CBurnell Blanks MD;  [facility];  Service: Cardiovascular;  Laterality: N/A;  . NASAL SEPTOPLASTY W/ TURBINOPLASTY    . REFRACTIVE SURGERY     bilateral  . RIGHT HEART CATHETERIZATION Bilateral 11/30/2011   Procedure: RIGHT HEART CATH;  Surgeon: CBurnell Blanks MD;  [facility];  Service: Cardiovascular;  Laterality: Bilateral;  . right knee arthroscopy      Prior to Admission medications   Medication Sig Start Date End Date Taking? Authorizing Provider  acyclovir (ZOVIRAX) 800 MG tablet Take 0.5 tablets by mouth 2 (two) times daily.   Yes Historical Provider, MD  aspirin EC 81 MG tablet Take 81 mg by mouth daily.   Yes Historical Provider, MD  carbidopa-levodopa (SINEMET IR) 25-100 MG tablet Take 3 tablets by mouth 4 (four) times daily.    Yes Historical Provider, MD  carboxymethylcellulose 1 % ophthalmic solution Apply 1 drop to eye 3 (three) times daily as needed (dry eyes).   Yes Historical Provider, MD  cetirizine (ZYRTEC) 10 MG chewable tablet Chew 10 mg by mouth 2 (two) times daily as needed for other. At Danny onset of tongue swelling/angioedema 09/24/15  Yes Historical Provider, MD  cyclobenzaprine (FLEXERIL) 10 MG tablet Take 5 mg by mouth 3 (three) times daily as needed for muscle spasms.   Yes Historical Provider, MD    dexamethasone (DECADRON) 4 MG tablet Take 40 mg by mouth once a week. Take ten tablets (462m every Wednesday with pomalidomide.   Yes Historical Provider, MD  diazepam (VALIUM) 2 MG tablet Take 2 mg by mouth at bedtime as needed for other. Restless leg   Yes Historical Provider, MD  docusate sodium (COLACE) 100 MG capsule Take 100 mg by mouth 2 (two) times daily. 08/20/15  Yes Historical Provider, MD  finasteride (PROSCAR) 5 MG tablet Take 5 mg by mouth daily.   Yes Historical Provider, MD  fish oil-omega-3 fatty acids 1000 MG capsule Take 1 g by mouth daily.    Yes Historical Provider, MD  folic acid (FOLVITE) 1 MG tablet Take 1 mg by mouth daily. 09/24/15  Yes Historical Provider, MD  gabapentin (NEURONTIN) 800 MG tablet Take 800 mg by mouth 3 (three) times daily.   Yes Historical Provider, MD  iron sucrose (VENOFER) 20 MG/ML injection Inject 200 mg into Danny vein once a week. 09/24/15  Yes Historical Provider, MD  ketoconazole (NIZORAL) 2 % cream Apply 1 application topically daily as needed for irritation.   Yes Historical Provider, MD  metFORMIN (GLUCOPHAGE) 500 MG tablet Take 500 mg by mouth 2 (two) times daily with a meal.   Yes Historical Provider, MD  methylcellulose (ARTIFICIAL TEARS) 1 % ophthalmic solution Place 1 drop into both eyes at bedtime as needed (dry eyes).   Yes Historical Provider, MD  pantoprazole (PROTONIX) 40 MG tablet Take 1 tablet (40 mg total) by mouth daily. For GERD 07/16/15  Yes Ripudeep Krystal Eaton, MD  polycarbophil (FIBERCON) 625 MG tablet Take 2 tablets by mouth daily.   Yes Historical Provider, MD  pomalidomide (POMALYST) 4 MG capsule Take 4 mg by mouth daily. Take with water on days 1-21. Repeat every 28 days.   Yes Historical Provider, MD  pravastatin (PRAVACHOL) 40 MG tablet Take 1 tablet (40 mg total) by mouth every evening. 01/19/15  Yes Lelon Perla, MD  simethicone (MYLICON) 80 MG chewable tablet Chew 80 mg by mouth every 6 (six) hours as needed for flatulence.    Yes Historical Provider, MD  simvastatin (ZOCOR) 40 MG tablet Take 20 mg by mouth daily. 09/24/15  Yes Historical Provider, MD  zinc oxide 20 % ointment Apply 1 application topically 2 (two) times daily as needed for irritation.   Yes Historical Provider, MD  amLODipine (NORVASC) 5 MG tablet Take 1 tablet (5 mg total) by mouth daily. 07/16/15   Ripudeep Krystal Eaton, MD  Cholecalciferol (VITAMIN D) 2000 units CAPS Take 1 capsule (2,000 Units total) by mouth daily. Reported on 07/16/2015 07/16/15   Ripudeep Krystal Eaton, MD  ferrous sulfate 325 (65 FE) MG tablet Take 1 tablet (325 mg total) by mouth at bedtime. Reported on 07/16/2015 07/16/15   Ripudeep Krystal Eaton, MD  metoprolol (LOPRESSOR) 50 MG tablet Take 0.5 tablets (25 mg total) by mouth 2 (two) times daily. 07/16/15   Ripudeep Krystal Eaton, MD  polyethylene glycol (MIRALAX) packet Take 17 g by mouth daily. 07/16/15   Ripudeep Krystal Eaton, MD    Scheduled Meds: . sodium chloride   Intravenous Once  . acyclovir  400 mg Oral BID  . carbidopa-levodopa  3 tablet Oral QID  . [START ON 09/30/2015] dexamethasone  40 mg Oral Q Wed  . docusate sodium  100 mg Oral BID  . famotidine (PEPCID) IV  20 mg Intravenous Q12H  . ferrous sulfate  325 mg Oral QHS  . finasteride  5 mg Oral Daily  . folic acid  1 mg Oral Daily  . gabapentin  800 mg Oral TID  . insulin aspart  0-9 Units Subcutaneous TID WC  . omega-3 acid ethyl esters  1 g Oral Daily  . pantoprazole  40 mg Oral Daily  . polycarbophil  1,250 mg Oral Q breakfast  . pomalidomide  4 mg Oral Daily  . pravastatin  40 mg Oral QPM   Infusions:   PRN Meds: acetaminophen, polyvinyl alcohol, simethicone   Allergies as of 09/24/2015 - Review Complete 09/24/2015  Allergen Reaction Noted  . Spironolactone Other (See Comments) 01/31/2015  . Avodart [dutasteride] Other (See Comments)   . Avodart [dutasteride] Other (See Comments) 01/31/2015  . Codeine Other (See Comments)   . Diazepam  12/27/2012  . Diazepam Other (See Comments) 01/31/2015    . Doxazosin  12/27/2012  . Doxazosin Other (See Comments) 01/31/2015  . Feraheme [ferumoxytol] Swelling 11/15/2011  . Spironolactone  12/27/2012    Family History  Problem Relation Age of Onset  . Heart disease Father   . Osteoarthritis Mother   . Hypertension Sister   . Hyperlipidemia      Social History   Social History  . Marital status: Married    Spouse name: N/A  . Number of children: 0  . Years of education: N/A   Occupational History  . retired    Social History Main Topics  . Smoking status: Former Smoker    Quit date: 09/30/1971  . Smokeless tobacco: Never Used  . Alcohol use No  . Drug use: No  . Sexual activity: Not Currently   Other Topics Concern  . Not on file   Social History Narrative   ** Merged History Encounter **        REVIEW OF SYSTEMS: Constitutional:  Weight down from 250s in 02/2015 to 190s currently. ENT:  No nose bleeds Pulm:  No SOB or cough CV:  No palpitations, no LE edema.  GU:  No hematuria, no frequency.  Some oliguria. GI:  Per HPI Heme:  perHPI   Transfusions:  Per HPI Neuro:  No headaches, no peripheral tingling or numbness Derm:  No itching, no rash or sores.  Endocrine:  No sweats or chills.  No polyuria or dysuria Immunization:  Not queried Travel:  None beyond local counties in last few months.    PHYSICAL EXAM: Vital signs in last 24 hours: Vitals:   09/25/15 0700 09/25/15 0800  BP: 103/65 98/74  Pulse: 81 91  Resp: 15 20  Temp:  98.7 F (37.1 C)   Wt Readings from Last 3 Encounters:  07/16/15 86.1 kg (189 lb 14.4 oz)  03/18/15 92.1 kg (203 lb 0.7 oz)  01/31/15 96.2 kg (212 lb)    General: pleasant, pale, not ill looking Head:  No asymmetry, trauma or swelling  Eyes:  No icterus.  + conj pallor Ears:  Not HOH  Nose:  No discharge Mouth:  Clear, moist, somewhat pale oral MM Neck:  No mass, no JVD Lungs:  No labored breathing or cough.  Clear bil. Heart: RRR.  Soft murmer.  S1/s2 present Abdomen:   Soft, NT, ND.  Some fullness in RUQ.   Rectal: formed brown stool in vault.  No visible blood.  External skin tags   Musc/Skeltl: no joint redness or swelling Extremities:  No CCE  Neurologic:  Alert, oriented x 3.  Speech is slow, work finding difficulty.  Skin:  No rash, no sores Psych:  Pleasant, cooperative.  Not depressed  Intake/Output from previous day: 08/17 0701 - 08/18 0700 In: 345 [I.V.:10; Blood:335] Out: 500 [Urine:500] Intake/Output this shift: No intake/output data recorded.  LAB RESULTS:  Recent Labs  09/24/15 1555 09/24/15 1607 09/25/15 0716  WBC 7.6  --  7.0  HGB 6.0* 7.1*  7.3*  HCT 20.2* 21.0* 23.5*  PLT 134*  --  141*   BMET Lab Results  Component Value Date   NA 136 09/24/2015   NA 133 (L) 09/24/2015   NA 131 (L) 07/16/2015   K 4.0 09/24/2015   K 4.1 09/24/2015   K 3.8 07/16/2015   CL 97 (L) 09/24/2015   CL 102 09/24/2015   CL 99 (L) 07/16/2015   CO2 24 09/24/2015   CO2 27 07/16/2015   CO2 28 03/18/2015   GLUCOSE 235 (H) 09/24/2015   GLUCOSE 252 (H) 09/24/2015   GLUCOSE 165 (H) 07/16/2015   BUN 32 (H) 09/24/2015   BUN 27 (H) 09/24/2015   BUN 10 07/16/2015   CREATININE 0.60 (L) 09/24/2015   CREATININE 0.71 09/24/2015   CREATININE 0.52 (L) 07/16/2015   CALCIUM 9.9 09/24/2015   CALCIUM 10.1 07/16/2015   CALCIUM 10.2 03/18/2015   LFT No results for input(s): PROT, ALBUMIN, AST, ALT, ALKPHOS, BILITOT, BILIDIR, IBILI in Danny last 72 hours. [name]/INR Lab Results  Component Value Date   INR 1.05 09/25/2015   INR 0.96 07/16/2015   INR 0.92 03/30/2015   Hepatitis Panel No results for input(s): HEPBSAG, HCVAB, HEPAIGM, HEPBIGM in Danny last 72 hours. C-Diff No components found for: CDIFF Lipase     Component Value Date/Time   LIPASE 25 04/18/2012 2023    Drugs of Abuse  No results found for: LABOPIA, COCAINSCRNUR, LABBENZ, AMPHETMU, THCU, LABBARB   RADIOLOGY STUDIES: Ct Head Wo Contrast  Result Date: 09/24/2015 CLINICAL DATA:   Syncope. EXAM: CT HEAD WITHOUT CONTRAST CT CERVICAL SPINE WITHOUT CONTRAST TECHNIQUE: Multidetector CT imaging of Danny head and cervical spine was performed following Danny standard protocol without intravenous contrast. Multiplanar CT image reconstructions of Danny cervical spine were also generated. COMPARISON:  None. FINDINGS: CT HEAD FINDINGS There is mild generalized age related parenchymal atrophy with commensurate dilatation of Danny sulci. No hydrocephalus. There is no mass, hemorrhage, edema or other evidence of acute parenchymal abnormality. No extra-axial hemorrhage. There are chronic calcified atherosclerotic changes of Danny large vessels at Danny skull base. No osseous fracture or dislocation seen. No evidence of acute sinusitis. Mastoid air cells are clear. CT CERVICAL SPINE FINDINGS Degenerative changes are seen throughout Danny cervical spine, mild to moderate in degree, with associated disc space narrowings and osseous spurring. Associated disc-osteophytic bulges are seen at Danny C3-4 through C7-T1 levels causing mild central canal stenoses. Additional degenerative hypertrophy of Danny uncovertebral and facet joints are causing moderate to severe neural foramen stenoses at Danny C3-4, C4-5 and C6-7 levels with possible associated nerve root impingements. Slight reversal of Danny normal cervical spine lordosis is likely related to these underlying degenerative changes. No fracture line or displaced fracture fragment identified. Facet joints appear normally aligned throughout. Paravertebral soft tissues are unremarkable. IMPRESSION: 1. No acute intracranial abnormality. No intracranial mass, hemorrhage or edema. No skull fracture. 2. No fracture or acute subluxation identified in Danny cervical spine. Degenerative changes of Danny cervical spine, as detailed above. Electronically Signed   By: Franki Cabot M.D.   On: 09/24/2015 17:10   Ct Cervical Spine Wo Contrast  Result Date: 09/24/2015 CLINICAL DATA:  Syncope. EXAM:  CT HEAD WITHOUT CONTRAST CT CERVICAL SPINE WITHOUT CONTRAST TECHNIQUE: Multidetector CT imaging of Danny head and cervical spine was performed following Danny standard protocol without intravenous contrast. Multiplanar CT image reconstructions of Danny cervical spine were also generated. COMPARISON:  None. FINDINGS: CT HEAD FINDINGS There is mild generalized age related parenchymal  atrophy with commensurate dilatation of Danny sulci. No hydrocephalus. There is no mass, hemorrhage, edema or other evidence of acute parenchymal abnormality. No extra-axial hemorrhage. There are chronic calcified atherosclerotic changes of Danny large vessels at Danny skull base. No osseous fracture or dislocation seen. No evidence of acute sinusitis. Mastoid air cells are clear. CT CERVICAL SPINE FINDINGS Degenerative changes are seen throughout Danny cervical spine, mild to moderate in degree, with associated disc space narrowings and osseous spurring. Associated disc-osteophytic bulges are seen at Danny C3-4 through C7-T1 levels causing mild central canal stenoses. Additional degenerative hypertrophy of Danny uncovertebral and facet joints are causing moderate to severe neural foramen stenoses at Danny C3-4, C4-5 and C6-7 levels with possible associated nerve root impingements. Slight reversal of Danny normal cervical spine lordosis is likely related to these underlying degenerative changes. No fracture line or displaced fracture fragment identified. Facet joints appear normally aligned throughout. Paravertebral soft tissues are unremarkable. IMPRESSION: 1. No acute intracranial abnormality. No intracranial mass, hemorrhage or edema. No skull fracture. 2. No fracture or acute subluxation identified in Danny cervical spine. Degenerative changes of Danny cervical spine, as detailed above. Electronically Signed   By: Franki Cabot M.D.   On: 09/24/2015 17:10   Dg Chest Portable 1 View  Result Date: 09/24/2015 CLINICAL DATA:  Near syncope EXAM: PORTABLE CHEST  1 VIEW COMPARISON:  07/16/2015 FINDINGS: Cardiomediastinal silhouette is stable. Status post median sternotomy. There is chronic elevation of Danny right hemidiaphragm. No infiltrate or pulmonary edema. IMPRESSION: No active disease. Again noted status post median sternotomy and cardiac valve replacement. Chronic elevation of Danny right hemidiaphragm. Electronically Signed   By: Lahoma Crocker M.D.   On: 09/24/2015 17:20    ENDOSCOPIC STUDIES: Per HPI  IMPRESSION:   *  Chronic blood loss anemia.  S/p PRBC x 2 overnight with good response. Getting weekly transfusions at [facility] Multiple myeloma being actively treated, just started a study drug on 8/16.  ? Was Danny med source of [name] pre-syncope. .   *  Rectal bleeding,chronic.  Decided to forgo hemorrhoidal banding and follow up with GI 8/29 at [facility].  No melena on any exam or per [name] reprt.    *  Elevation BUN.  ? Does this represent upper GI bleed?   *  Thrombocytopenia  *  Tissue AVR on 2, latest 2013.     PLAN:     *  No plans for endoscopic workup.  Discussed with Dr Loletha Carrow.  Feed [name].    Azucena Freed  09/25/2015, 8:21 AM Pager: [phone]    I have reviewed Danny entire case in detail with Danny above APP and discussed Danny plan in detail.  Therefore, I agree with Danny diagnoses recorded above. In addition,  I have personally interviewed and examined [name] and have personally reviewed any abdominal/pelvic CT scan images.  My additional thoughts are as follows:  This [name] does not appear to have an upper GI bleed.  [name] and [name] wife deny that [name] ever passed black tarry stool, and none was found on our PA exam this AM.  [name] Hgb is not far below [name] baseline. [name] continue to have intermittent small volume int HR bleeding, and still does not feel [name] is ready to have banding done for that.  [name] and was invited to follow up with Dr Carlean Purl as needed.   No plans for endoscopic procedures at this juncture, so you may discharge  [name] when  you see fit.    Nelida Meuse III Pager [phone]  Mon-Fri 8a-5p [phone] after 5p, weekends, holidays

## 2015-09-25 NOTE — Progress Notes (Signed)
Pt arrived from ED accompanied by RN.  Pt is Alert and oriented x 4 and vital signs are stable.  MD has been notified.  Will continue to monitor.

## 2015-09-25 NOTE — Progress Notes (Signed)
   Subjective:  Doing well. No further episodes of dizziness. No chest pain. No GI bleed (did not have any BM).    Objective:  Vital signs in last 24 hours: Vitals:   09/25/15 0417 09/25/15 0700 09/25/15 0800 09/25/15 1000  BP: 103/61 103/65 98/74 112/64  Pulse: 82 81 91 91  Resp: '16 15 20 '$ (!) 21  Temp: 97.6 F (36.4 C)  98.7 F (37.1 C)   TempSrc: Axillary  Oral   SpO2: 96% 94% 99% 100%   Vitals reviewed. General: resting in bed, NAD HEENT: PERRL, EOMI, no scleral icterus.  Cardiac: RRR, systolic murmur heard best on RUSB Pulm: ctab Abd: soft, nontender, nondistended, BS present Ext: warm and well perfused, no pedal edema Neuro: alert and oriented X3,   Assessment/Plan:  Active Problems:   Essential hypertension   GERD   H/O aortic valve replacement   Syncope   S/P aortic valve replacement with bioprosthetic valve   GI bleed  Pre-syncope - with positive orthostatic hypotension 2/2 to hypovolemia and symptomatic anemia He is feeling better with IVF and blood transfusion. No further dizziness. - will recheck orthostatic vital - will be able to discharge with close f/up if orthostatic hypotension resolves.   Symptomatic Anemia - 2/2 to chronic GI bleed + Multiple Myeloma + chemotherapy Patient gets blood transffion outpatient every week due to this. His admission hgb was 6.0, baseline is 7-8.  Transfused 2 units, hgb 7.3. He is no longer symptomatic - will have him f/up with his regular physicians, needs CBC at follow up and intermittent transfusions.   Chronic GI bleed previous admission GI recommended banding of internal hemorrhoids. However, patient states that he followed up with GI outpatient and they recommended deferring any procedure for now until he is stable from his multiple myeloma treatment as he is immunosuppressed from his chemotherapy.  rectal exam showed brown stool, no melena or blood currenlty.  was FOBT+ - appreciate GI recs. No procedure planned  here. F/up with GI outpatient at the [place].   DM II - last hgba1c unknown, but has been in 7-8 range per patient - held metformin, on SSI here  HTN - holding home meds in the setting of hypovolemia/hypotension  BPH - cont finasteride.   MM - cont pomalidomide. F/up with outpatient onc.   S/p prosthetic Aortic valve replacement - not on anticoag currenlty, only on aspirin. This was discussed with his cardiologist and the decision was made to be on aspirin. Will not do anticoag now anyway in the setting of GI bleed and chronic anemia   Dispo: Anticipated discharge in approximately today.  Dellia Nims, MD 09/25/2015, 11:19 AM Pager: [phone]

## 2015-09-25 NOTE — Progress Notes (Signed)
Pt discharged to home with wife. Clothing, home medicine, and cell phone all sent home with pt.

## 2015-09-26 LAB — TYPE AND SCREEN
ABO/RH(D): A NEG
Antibody Screen: NEGATIVE
UNIT DIVISION: 0
UNIT DIVISION: 0

## 2015-10-07 NOTE — Progress Notes (Signed)
HPI: FU AVR. Previous AVR in 2006 with a porcine valve. Echocardiogram 11/29/11: Moderate LVH, EF 81-19%, grade 1 diastolic dysfunction, critical aortic stenosis with a mean gradient of 80 mmHg. LHC 11/28/11: Normal coronary arteries, severe aortic stenosis. Patient underwent redo aortic valve replacement with a pericardial tissue valve 12/06/11 with Dr. Roxy Manns. Echocardiogram December 2016 showed normal LV function, grade 2 diastolic dysfunction, bioprosthetic aortic valve with moderate aortic insufficiency, severe left atrial enlargement, moderate right ventricular enlargement. Abdominal ultrasound January 2017 showed no aneurysm. Patient has had problems with GI bleeds. He has multiple myeloma on Velcade. He was recently discharged following admission for orthostatic hypotension and chronic lower GI bleeding. Since he was last seen, He has had chemotherapy for his multiple myeloma and severe anemia requiring transfusion. He has 2 episodes of dizziness and near syncope. The most recent lasted approximately 2 hours and he was seen at [facility] and required 5 units of blood by his report. His anemia has worsened since he started his chemotherapy. He denies dyspnea, chest pain, frank syncope, pedal edema.  Current Outpatient Prescriptions  Medication Sig Dispense Refill  . acyclovir (ZOVIRAX) 800 MG tablet Take 0.5 tablets by mouth 2 (two) times daily.    . carbidopa-levodopa (SINEMET IR) 25-100 MG tablet Take 3 tablets by mouth 4 (four) times daily.     . carboxymethylcellulose 1 % ophthalmic solution Apply 1 drop to eye 3 (three) times daily as needed (dry eyes).    . cetirizine (ZYRTEC) 10 MG chewable tablet Chew 10 mg by mouth 2 (two) times daily as needed for other. At the onset of tongue swelling/angioedema    . cyclobenzaprine (FLEXERIL) 10 MG tablet Take 5 mg by mouth 3 (three) times daily as needed for muscle spasms.    Marland Kitchen dexamethasone (DECADRON) 4 MG tablet Take 40 mg by mouth once a  week. Take ten tablets (38m) every Wednesday with pomalidomide.    . diazepam (VALIUM) 2 MG tablet Take 2 mg by mouth at bedtime as needed for other. Restless leg    . docusate sodium (COLACE) 100 MG capsule Take 100 mg by mouth 2 (two) times daily.    . ferrous sulfate 325 (65 FE) MG tablet Take 1 tablet (325 mg total) by mouth at bedtime. Reported on 07/16/2015 30 tablet 3  . finasteride (PROSCAR) 5 MG tablet Take 5 mg by mouth daily.    . fish oil-omega-3 fatty acids 1000 MG capsule Take 1 g by mouth daily.     . folic acid (FOLVITE) 1 MG tablet Take 1 mg by mouth daily.    .Marland Kitchengabapentin (NEURONTIN) 800 MG tablet Take 800 mg by mouth 3 (three) times daily.    . iron sucrose (VENOFER) 20 MG/ML injection Inject 200 mg into the vein once a week.    .Marland Kitchenketoconazole (NIZORAL) 2 % cream Apply 1 application topically daily as needed for irritation.    . metFORMIN (GLUCOPHAGE) 500 MG tablet Take 500 mg by mouth 2 (two) times daily with a meal.    . methylcellulose (ARTIFICIAL TEARS) 1 % ophthalmic solution Place 1 drop into both eyes at bedtime as needed (dry eyes).    . pantoprazole (PROTONIX) 40 MG tablet Take 1 tablet (40 mg total) by mouth daily. For GERD 30 tablet 3  . polycarbophil (FIBERCON) 625 MG tablet Take 2 tablets by mouth daily.    . pomalidomide (POMALYST) 4 MG capsule Take 4 mg by mouth daily. Take with water on  days 1-21. Repeat every 28 days.    . pravastatin (PRAVACHOL) 40 MG tablet Take 1 tablet (40 mg total) by mouth every evening. 30 tablet 11  . simethicone (MYLICON) 80 MG chewable tablet Chew 80 mg by mouth every 6 (six) hours as needed for flatulence.    . simvastatin (ZOCOR) 40 MG tablet Take 20 mg by mouth daily.    Marland Kitchen zinc oxide 20 % ointment Apply 1 application topically 2 (two) times daily as needed for irritation.     No current facility-administered medications for this visit.      Past Medical History:  Diagnosis Date  . Allergic rhinitis   . Anemia 2009  . Aortic  stenosis    a. s/p porcine AVR 2006;  b. s/p redo tissue AVR 12/2011 (Dr. Roxy Manns) - preAVR LHC with no CAD  . Asthma   . BPH (benign prostatic hypertrophy)   . Cancer ([address])    multiple myeloma  . Cataract   . Chronic cough   . Chronic interstitial cystitis   . Depression    pt denies  . Diabetes mellitus    type 2  . Diabetes mellitus without complication ([place])   . Diverticulosis of colon    on colonoscopy 2008  . GERD (gastroesophageal reflux disease)    bravo pH study 2008  . Gout   . H/O aortic valve replacement with porcine valve    2006, 2013  . Headache(784.0)   . Hemorrhoids    external and internal  . Hiatal hernia   . HTN (hypertension)   . Hx of echocardiogram    a. Echo  (post AVR) 12/2011:  mod LVH, EF 60-65%, Gr 2 diast dysfn, mild AI, AVR ok (mean gradient 19 mmHg), MAC, mild BAE  . Hyperlipidemia   . Hypertension   . Hyperthyroidism   . IBS (irritable bowel syndrome)   . Insomnia   . Lower GI bleed 07/16/2015  . OA (osteoarthritis)   . OSA (obstructive sleep apnea)    USES CPAP AS NEEDED  . Periodontitis    chronic with bone loss  . Restless leg syndrome   . S/P aortic valve replacement with bioprosthetic valve 12/06/2011   Redo AVR using 23 mm [facility] Ease pericardial tissue valve    Past Surgical History:  Procedure Laterality Date  . AORTA - FEMORAL ARTERY BYPASS GRAFT    . AORTIC VALVE REPLACEMENT  10/13/2004   55m Edwards [place] pericardial tissue valve  . AORTIC VALVE REPLACEMENT  12/06/2011   Procedure: REDO AORTIC VALVE REPLACEMENT (AVR);  Surgeon: CRexene Alberts MD;  [place] [facility]  Service: Open Heart Surgery;  Laterality: N/A;  . BUNIONECTOMY     right  . CARDIAC SURGERY     aorta vavle replacement  . CATARACT EXTRACTION  2009    &   2012   BILATERAL  . COLONOSCOPY  08/31/2011   Procedure: COLONOSCOPY;  Surgeon: RInda Castle MD;  [place] [facility]  Service: Endoscopy;  Laterality: N/A;  .  ESOPHAGOGASTRODUODENOSCOPY  08/30/2011   Procedure: ESOPHAGOGASTRODUODENOSCOPY (EGD);  Surgeon: RInda Castle MD;  [place] [facility]  Service: Endoscopy;  Laterality: N/A;  Rm 3005   . HERNIA REPAIR    . JOINT REPLACEMENT     knee  . LEFT HEART CATHETERIZATION WITH CORONARY ANGIOGRAM N/A 11/30/2011   Procedure: LEFT HEART CATHETERIZATION WITH CORONARY ANGIOGRAM;  Surgeon: CBurnell Blanks MD;  [facility];  Service: Cardiovascular;  Laterality: N/A;  .  NASAL SEPTOPLASTY W/ TURBINOPLASTY    . REFRACTIVE SURGERY     bilateral  . RIGHT HEART CATHETERIZATION Bilateral 11/30/2011   Procedure: RIGHT HEART CATH;  Surgeon: Burnell Blanks, MD;  Location: [facility] CATH LAB;  Service: Cardiovascular;  Laterality: Bilateral;  . right knee arthroscopy      Social History   Social History  . Marital status: Married    Spouse name: N/A  . Number of children: 0  . Years of education: N/A   Occupational History  . retired    Social History Main Topics  . Smoking status: Former Smoker    Quit date: 09/30/1971  . Smokeless tobacco: Never Used  . Alcohol use No  . Drug use: No  . Sexual activity: Not Currently   Other Topics Concern  . Not on file   Social History Narrative   ** Merged History Encounter **        Family History  Problem Relation Age of Onset  . Heart disease Father   . Osteoarthritis Mother   . Hypertension Sister   . Hyperlipidemia      ROS: no fevers or chills, productive cough, hemoptysis, dysphasia, odynophagia, melena, hematochezia, dysuria, hematuria, rash, seizure activity, orthopnea, PND, pedal edema, claudication. Remaining systems are negative.  Physical Exam: Well-developed well-nourished in no acute distress.  Skin is warm and dry.  HEENT is normal.  Neck is supple.  Chest is clear to auscultation with normal expansion.  Cardiovascular exam is regular rate and rhythm. 3/6 systolic murmur left sternal border. No diastolic  murmur. Abdominal exam nontender or distended. No masses palpated. Extremities show no edema. neuro grossly intact  A/P  1 History of aortic valve replacement-continue SBE prophylaxis. Plan repeat echocardiogram December 2017 even moderate aortic insufficiency on most recent echo.  2 hypertension-blood pressure is mildly elevated. However it apparently has been running low. He has required IV fluids and transfusions. I will not add additional medications at this point.  3 hyperlipidemia-continue Pravachol.  4 history of cough syncope  5 multiple myeloma-patient has developed significant anemia related to chemotherapy. Management per oncology.  Kirk Ruths, MD

## 2015-10-08 ENCOUNTER — Ambulatory Visit (INDEPENDENT_AMBULATORY_CARE_PROVIDER_SITE_OTHER): Payer: Medicare Other | Admitting: Cardiology

## 2015-10-08 ENCOUNTER — Encounter: Payer: Self-pay | Admitting: Cardiology

## 2015-10-08 VITALS — BP 156/70 | HR 60 | Ht 69.0 in | Wt 195.0 lb

## 2015-10-08 DIAGNOSIS — I351 Nonrheumatic aortic (valve) insufficiency: Secondary | ICD-10-CM

## 2015-10-08 DIAGNOSIS — E785 Hyperlipidemia, unspecified: Secondary | ICD-10-CM | POA: Diagnosis not present

## 2015-10-08 DIAGNOSIS — I10 Essential (primary) hypertension: Secondary | ICD-10-CM

## 2015-10-08 NOTE — Patient Instructions (Signed)
Medication Instructions:   NO CHANGES.   Testing/Procedures: Your physician has requested that you have an echocardiogram. Echocardiography is a painless test that uses sound waves to create images of your heart. It provides your doctor with information about the size and shape of your heart and how well your heart's chambers and valves are working. This procedure takes approximately one hour. There are no restrictions for this procedure.    Follow-Up: Your physician wants you to follow-up in: December 2017 WITH DR Stanford Breed AFTER YOU HAVE HAD YOUR ECHO.   You will receive a reminder letter in the mail two months in advance. If you don't receive a letter, please call our office to schedule the follow-up appointment.   Any Other Special Instructions Will Be Listed Below (If Applicable).     If you need a refill on your cardiac medications before your next appointment, please call your pharmacy.

## 2015-10-14 ENCOUNTER — Inpatient Hospital Stay (HOSPITAL_COMMUNITY)
Admission: EM | Admit: 2015-10-14 | Discharge: 2015-10-18 | DRG: 812 | Disposition: A | Discharge status: Home / Self Care | Payer: Medicare Other | Attending: Student in an Organized Health Care Education/Training Program | Admitting: Student in an Organized Health Care Education/Training Program

## 2015-10-14 ENCOUNTER — Encounter (HOSPITAL_COMMUNITY): Payer: Self-pay | Admitting: Neurology

## 2015-10-14 DIAGNOSIS — R55 Syncope and collapse: Secondary | ICD-10-CM | POA: Diagnosis not present

## 2015-10-14 DIAGNOSIS — C9000 Multiple myeloma not having achieved remission: Secondary | ICD-10-CM | POA: Diagnosis not present

## 2015-10-14 DIAGNOSIS — G2581 Restless legs syndrome: Secondary | ICD-10-CM

## 2015-10-14 DIAGNOSIS — E119 Type 2 diabetes mellitus without complications: Secondary | ICD-10-CM | POA: Diagnosis present

## 2015-10-14 DIAGNOSIS — R404 Transient alteration of awareness: Secondary | ICD-10-CM | POA: Diagnosis not present

## 2015-10-14 DIAGNOSIS — I10 Essential (primary) hypertension: Secondary | ICD-10-CM | POA: Diagnosis present

## 2015-10-14 DIAGNOSIS — Z9889 Other specified postprocedural states: Secondary | ICD-10-CM

## 2015-10-14 DIAGNOSIS — Z79899 Other long term (current) drug therapy: Secondary | ICD-10-CM | POA: Diagnosis not present

## 2015-10-14 DIAGNOSIS — Z952 Presence of prosthetic heart valve: Secondary | ICD-10-CM

## 2015-10-14 DIAGNOSIS — N400 Enlarged prostate without lower urinary tract symptoms: Secondary | ICD-10-CM | POA: Diagnosis present

## 2015-10-14 DIAGNOSIS — Z7952 Long term (current) use of systemic steroids: Secondary | ICD-10-CM | POA: Diagnosis not present

## 2015-10-14 DIAGNOSIS — D649 Anemia, unspecified: Secondary | ICD-10-CM | POA: Diagnosis not present

## 2015-10-14 DIAGNOSIS — T451X5A Adverse effect of antineoplastic and immunosuppressive drugs, initial encounter: Secondary | ICD-10-CM | POA: Diagnosis not present

## 2015-10-14 DIAGNOSIS — Z6827 Body mass index (BMI) 27.0-27.9, adult: Secondary | ICD-10-CM

## 2015-10-14 DIAGNOSIS — E46 Unspecified protein-calorie malnutrition: Secondary | ICD-10-CM | POA: Diagnosis present

## 2015-10-14 DIAGNOSIS — D701 Agranulocytosis secondary to cancer chemotherapy: Secondary | ICD-10-CM | POA: Diagnosis not present

## 2015-10-14 DIAGNOSIS — G4733 Obstructive sleep apnea (adult) (pediatric): Secondary | ICD-10-CM | POA: Diagnosis present

## 2015-10-14 DIAGNOSIS — Z7984 Long term (current) use of oral hypoglycemic drugs: Secondary | ICD-10-CM

## 2015-10-14 DIAGNOSIS — K219 Gastro-esophageal reflux disease without esophagitis: Secondary | ICD-10-CM | POA: Diagnosis present

## 2015-10-14 DIAGNOSIS — Z953 Presence of xenogenic heart valve: Secondary | ICD-10-CM

## 2015-10-14 DIAGNOSIS — K649 Unspecified hemorrhoids: Secondary | ICD-10-CM | POA: Diagnosis present

## 2015-10-14 DIAGNOSIS — Z7982 Long term (current) use of aspirin: Secondary | ICD-10-CM | POA: Diagnosis not present

## 2015-10-14 DIAGNOSIS — K922 Gastrointestinal hemorrhage, unspecified: Secondary | ICD-10-CM

## 2015-10-14 DIAGNOSIS — Z87891 Personal history of nicotine dependence: Secondary | ICD-10-CM

## 2015-10-14 DIAGNOSIS — D72819 Decreased white blood cell count, unspecified: Secondary | ICD-10-CM | POA: Diagnosis not present

## 2015-10-14 DIAGNOSIS — E785 Hyperlipidemia, unspecified: Secondary | ICD-10-CM | POA: Diagnosis present

## 2015-10-14 DIAGNOSIS — D509 Iron deficiency anemia, unspecified: Secondary | ICD-10-CM

## 2015-10-14 DIAGNOSIS — D464 Refractory anemia, unspecified: Secondary | ICD-10-CM | POA: Diagnosis not present

## 2015-10-14 DIAGNOSIS — R011 Cardiac murmur, unspecified: Secondary | ICD-10-CM | POA: Diagnosis not present

## 2015-10-14 LAB — PREPARE RBC (CROSSMATCH)

## 2015-10-14 LAB — CBC
HCT: 16.8 % — ABNORMAL LOW (ref 39.0–52.0)
HEMOGLOBIN: 5 g/dL — AB (ref 13.0–17.0)
MCH: 26.6 pg (ref 26.0–34.0)
MCHC: 29.8 g/dL — ABNORMAL LOW (ref 30.0–36.0)
MCV: 89.4 fL (ref 78.0–100.0)
PLATELETS: 168 10*3/uL (ref 150–400)
RBC: 1.88 MIL/uL — AB (ref 4.22–5.81)
RDW: 18.2 % — ABNORMAL HIGH (ref 11.5–15.5)
WBC: 4.2 10*3/uL (ref 4.0–10.5)

## 2015-10-14 LAB — BASIC METABOLIC PANEL
ANION GAP: 5 (ref 5–15)
BUN: 22 mg/dL — ABNORMAL HIGH (ref 6–20)
CHLORIDE: 101 mmol/L (ref 101–111)
CO2: 26 mmol/L (ref 22–32)
CREATININE: 0.59 mg/dL — AB (ref 0.61–1.24)
Calcium: 9.8 mg/dL (ref 8.9–10.3)
GFR calc non Af Amer: >60 mL/min (ref >60)
Glucose, Bld: 239 mg/dL — ABNORMAL HIGH (ref 65–99)
Potassium: 3.8 mmol/L (ref 3.5–5.1)
SODIUM: 132 mmol/L — AB (ref 135–145)

## 2015-10-14 LAB — URINALYSIS, ROUTINE W REFLEX MICROSCOPIC
Bilirubin Urine: NEGATIVE
GLUCOSE, UA: 250 mg/dL — AB
Hgb urine dipstick: NEGATIVE
Ketones, ur: NEGATIVE mg/dL
LEUKOCYTES UA: NEGATIVE
Nitrite: NEGATIVE
PROTEIN: NEGATIVE mg/dL
SPECIFIC GRAVITY, URINE: 1.026 (ref 1.005–1.030)
pH: 5.5 (ref 5.0–8.0)

## 2015-10-14 LAB — POC OCCULT BLOOD, ED: FECAL OCCULT BLD: POSITIVE — AB

## 2015-10-14 LAB — CBG MONITORING, ED: GLUCOSE-CAPILLARY: 235 mg/dL — AB (ref 65–99)

## 2015-10-14 MED ORDER — PRAVASTATIN SODIUM 40 MG PO TABS
40.0000 mg | ORAL_TABLET | Freq: Every evening | ORAL | Status: DC
  Administered 2015-10-15 – 2015-10-17 (×3): 40 mg via ORAL
  Filled 2015-10-14 (×3): qty 1

## 2015-10-14 MED ORDER — FOLIC ACID 1 MG PO TABS
1.0000 mg | ORAL_TABLET | Freq: Every day | ORAL | Status: DC
  Administered 2015-10-15 – 2015-10-18 (×4): 1 mg via ORAL
  Filled 2015-10-14 (×4): qty 1

## 2015-10-14 MED ORDER — ACETAMINOPHEN 650 MG RE SUPP: 650.0000 mg | Freq: Four times a day (QID) | RECTAL | Status: DC | PRN

## 2015-10-14 MED ORDER — SODIUM CHLORIDE 0.9 % IV SOLN
10.0000 mL/h | Freq: Once | INTRAVENOUS | Status: AC
  Administered 2015-10-14: 10 mL/h via INTRAVENOUS

## 2015-10-14 MED ORDER — SODIUM CHLORIDE 0.9 % IV BOLUS (SEPSIS)
1000.0000 mL | Freq: Once | INTRAVENOUS | Status: AC
  Administered 2015-10-14: 1000 mL via INTRAVENOUS

## 2015-10-14 MED ORDER — ACYCLOVIR 400 MG PO TABS
400.0000 mg | ORAL_TABLET | Freq: Two times a day (BID) | ORAL | Status: DC
  Administered 2015-10-15 – 2015-10-18 (×8): 400 mg via ORAL
  Filled 2015-10-14 (×8): qty 1

## 2015-10-14 MED ORDER — SODIUM CHLORIDE 0.9 % IV SOLN
INTRAVENOUS | Status: DC
  Administered 2015-10-15: 03:00:00 via INTRAVENOUS

## 2015-10-14 MED ORDER — POLYVINYL ALCOHOL 1.4 % OP SOLN: 1.0000 [drp] | Freq: Every evening | OPHTHALMIC | Status: DC | PRN

## 2015-10-14 MED ORDER — SODIUM CHLORIDE 0.9% FLUSH
3.0000 mL | Freq: Two times a day (BID) | INTRAVENOUS | Status: DC
  Administered 2015-10-15 – 2015-10-18 (×6): 3 mL via INTRAVENOUS

## 2015-10-14 MED ORDER — ACETAMINOPHEN 325 MG PO TABS
650.0000 mg | ORAL_TABLET | Freq: Four times a day (QID) | ORAL | Status: DC | PRN
  Administered 2015-10-15 – 2015-10-17 (×3): 650 mg via ORAL
  Filled 2015-10-14 (×3): qty 2

## 2015-10-14 MED ORDER — INSULIN ASPART 100 UNIT/ML ~~LOC~~ SOLN
0.0000 [IU] | Freq: Three times a day (TID) | SUBCUTANEOUS | Status: DC
  Administered 2015-10-15: 1 [IU] via SUBCUTANEOUS
  Administered 2015-10-15 – 2015-10-16 (×3): 2 [IU] via SUBCUTANEOUS
  Administered 2015-10-16: 1 [IU] via SUBCUTANEOUS
  Administered 2015-10-16 – 2015-10-18 (×5): 2 [IU] via SUBCUTANEOUS

## 2015-10-14 MED ORDER — POMALIDOMIDE 4 MG PO CAPS
4.0000 mg | ORAL_CAPSULE | Freq: Every day | ORAL | Status: DC
  Administered 2015-10-15 – 2015-10-18 (×4): 4 mg via ORAL
  Filled 2015-10-14: qty 1

## 2015-10-14 MED ORDER — OMEGA-3-ACID ETHYL ESTERS 1 G PO CAPS
1.0000 g | ORAL_CAPSULE | Freq: Every day | ORAL | Status: DC
  Administered 2015-10-15 – 2015-10-18 (×4): 1 g via ORAL
  Filled 2015-10-14 (×4): qty 1

## 2015-10-14 NOTE — H&P (Signed)
Date: 10/14/2015               Patient Name:  [name] MRN: [id number]  DOB: February 22, 1945 Age / Sex: 70 y.o., male   PCP: Buzzy Han, MD         Medical Service: Internal Medicine Teaching Service         Attending Physician: Dr. Duffy Bruce, MD    First Contact: Dr. Marjory Sneddon  Pager: [phone]  Second Contact: Dr. 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 Patient was helping his wife cook dinner when he suddenly didn't feel well. He denies feeling lightheaded or dizzy, but said he knew something wasn't right. His wife caught him as he began to slouch over and she cradled his head as he fell. He denies any trauma or injury. Patient does not remember what happened next, but per wife, patient became very SOB with labored breathing. He lost consciousness for about 1 minute and experienced an episode of urinary incontinence. When he regained consciousness he was alert and back to his baseline. Of note, patient has chronic anemia from his multiple myeloma and chronic lower GI bleed requiring weekly blood transfusions - last transfused 6 days ago. He was recently admitted to the hospital one month ago for symptomatic anemia requiring 2 units of blood transfusion. He was also seen at [facility] a few weeks ago after a similar episode of syncope, again requiring transfusion at that time. He follows with oncology at [facility] and GI at the [place] in [place]. Patient gets his Hgb checked weekly at it was 7.4 on Tuesday.  He was offered transfusion at that time however declined  because he had already made plans to help his sister that day. He was scheduled for a transfusion tomorrow. He currently denies any blood in his stool. Patient says he often notices BRBPR when he has loose stools, but has not had any issues for a few weeks. He did notice that his stool was dark a few weeks ago however patient was on iron supplementation at that time. He currently denies any dark stool. Patient wad diagnosed with multiple myeloma in January of this year and is on pomalidomide and dexamethasone. He is also participating in a clinical trial at [facility]. He is not on any blood thinners but does take an ASA 81 daily. Patient says he had a recent EGD and colonoscopy at the [place] and his GI doctor is planning for a capusle endoscopy in the near future.   In the ED, his blood pressure was 92/55, HR 93, RR 11, T 97.5 F, O2 100% on RA. CBC was significant for a hgb of 5.0. FOBT was positive. Patient is asymptomatic. Denies chest pain, SOB, and palpitations.   Meds:  No outpatient prescriptions have been marked as taking for the 10/14/15 encounter [facility] Encounter).     Allergies: Allergies as of 10/14/2015 - Review Complete 10/14/2015  Allergen Reaction Noted  . Avodart [dutasteride] Other (See Comments) 01/31/2015  . Spironolactone Other (See Comments) 01/31/2015  . Codeine Other (  See Comments)   . Diazepam Other (See Comments) 01/31/2015  . Doxazosin Other (See Comments) 01/31/2015  . Feraheme [ferumoxytol] Swelling 11/15/2011   Past Medical History:  Diagnosis Date  . Allergic rhinitis   . Anemia 2009  . Aortic stenosis    a. s/p porcine AVR 2006;  b. s/p redo tissue AVR 12/2011 (Dr. Roxy Manns) - preAVR LHC with no CAD  . Asthma   . BPH (benign prostatic hypertrophy)   . Cancer ([place])    multiple myeloma  . Cataract   . Chronic cough   . Chronic interstitial cystitis   . Depression    pt denies  . Diabetes mellitus    type 2  . Diabetes mellitus without complication ([place])    . Diverticulosis of colon    on colonoscopy 2008  . GERD (gastroesophageal reflux disease)    bravo pH study 2008  . Gout   . H/O aortic valve replacement with porcine valve    2006, 2013  . Headache(784.0)   . Hemorrhoids    external and internal  . Hiatal hernia   . HTN (hypertension)   . Hx of echocardiogram    a. Echo  (post AVR) 12/2011:  mod LVH, EF 60-65%, Gr 2 diast dysfn, mild AI, AVR ok (mean gradient 19 mmHg), MAC, mild BAE  . Hyperlipidemia   . Hypertension   . Hyperthyroidism   . IBS (irritable bowel syndrome)   . Insomnia   . Lower GI bleed 07/16/2015  . OA (osteoarthritis)   . OSA (obstructive sleep apnea)    USES CPAP AS NEEDED  . Periodontitis    chronic with bone loss  . Restless leg syndrome   . S/P aortic valve replacement with bioprosthetic valve 12/06/2011   Redo AVR using 23 mm Edwards [employer] Ease pericardial tissue valve    Family History: MI in father, cancer (unknown) in mom and aunt  Social History: Patient lives at home with his wife. He does not drink alcohol and quit smoking cigarettes many years ago. He denies illicit drug use.   Review of Systems: A complete ROS was negative except as per HPI.   Physical Exam: Blood pressure (!) 97/48, pulse 72, temperature 97.3 F (36.3 C), temperature source Oral, resp. rate 17, SpO2 99 %. Physical Exam Constitutional: Very pale gentleman, NAD, appears comfortable HEENT: Atraumatic, normocephalic. PERRL, anicteric sclera. Dry mucous membranes  Neck: Supple, trachea midline.  Cardiovascular: RRR, 3/6 systolic murmur, no rubs, or gallops.  Pulmonary/Chest: CTAB, no wheezes, rales, or rhonchi. No chest wall abnormalities.  Abdominal: Soft, non tender, non distended. +BS.  Extremities: Pale and cool to touch. Distal pulses intact. 1+ pitting edema to mid shins bilaterally.  Neurological: A&Ox3, CN II - XII grossly intact.  Skin: No rashes or erythema  Psychiatric: Normal mood and affect  EKG: Sinus  rhythm, left axis deviation, possible Q waves in I and AVL  Assessment & Plan by Problem:  Patient is a 70 yo M with a pmhx of multiple myeloma and GI bleed requiring weekly transfusions who presents after a syncopal episode from home.   Syncope: Likely due to symptomatic anemia. History of chronic anemia requiring weekly blood transfusion - last transfused 6 days ago. Labs on Tuesday with Hbg of 7.4 and originally planned for transfusion tomorrow. Similar syncopal episode a few weeks ago requiring transfusion at [facility]. Today, Hgb 5.0 on presentation to the ED. Now s/p 1 of 2 units pRBCs. FOBT positive.  -- Check post transfusion  labs -- Continue to monitor for signs or symptoms of anemia  -- Transfuse as needed to maintain Hgb > 7.0 -- Hold home ASA 81 daily  -- F/u with GI outpatient as scheduled   Multiple Myeloma: -- Continue Pomalidomide 4 mg daily -- Dexamethasone weekly (on wednedays)  -- Continue acyclovir 283 mg BID -- Folic acid 1 mg daily   DM: -- hold home metformin  -- SSI   HLD: -- Continue home pravastatin 40 mg daily  -- Lovaza 1 g daily   BPH: -- Hold home finasteride 5 mg daily   Restless Leg Syndrome: -- Hold home valium 2 mg  -- Hold home flexeril   FEN: NS 125 cc/hr, replete lytes prn, heart healthy diet VTE ppx: SCDs Code Status: Full Code  Dispo: Admit patient to Inpatient with expected length of stay greater than 2 midnights.  Signed: Velna Ochs, MD 10/14/2015, 9:32 PM  Pager: [phone]

## 2015-10-14 NOTE — ED Notes (Signed)
Pt and wife asked that I confirm that blood be irradiated. Contacted Blood Bank, who confirmed that they had two units of A negative irradiated blood for this pt. Relayed to pt and family.

## 2015-10-14 NOTE — ED Notes (Signed)
Admitting MD at bedside.

## 2015-10-14 NOTE — ED Notes (Signed)
Consulted with Gertie Fey, PA regarding pt's low BP. Obtained verbal order for 1L NS bolus.

## 2015-10-14 NOTE — ED Provider Notes (Signed)
[facility] DEPT Provider Note   CSN: [id number] Arrival date & time: 10/14/15  1851     History   Chief Complaint Chief Complaint  Patient presents with  . Loss of Consciousness    HPI [name] is a 70 y.o. male.  HPI   70 year old male with history of multiple myeloma with a scheduled blood transfusion tomorrow, history of syncopal episodes in the past, brought home via EMS for evaluation of a syncopal episode at home. Prior to arrival, patient had a syncopal episode at home lasting for prostate 1-2 minutes which was witnessed by his wife. His wife was able to assist him to the ground. He denies any increased confusion, altered mental status after his syncopal episode. This complain of mild neck discomfort but states that this is chronic. He reported that his last hemoglobin was 7.4, which was obtained yesterday according to pt. He has had blood transfusion 6 days ago. He denies any abnormal bleeding. Currently denies any other significant pain.  Past Medical History:  Diagnosis Date  . Allergic rhinitis   . Anemia 2009  . Aortic stenosis    a. s/p porcine AVR 2006;  b. s/p redo tissue AVR 12/2011 (Dr. Roxy Manns) - preAVR LHC with no CAD  . Asthma   . BPH (benign prostatic hypertrophy)   . Cancer ([place])    multiple myeloma  . Cataract   . Chronic cough   . Chronic interstitial cystitis   . Depression    pt denies  . Diabetes mellitus    type 2  . Diabetes mellitus without complication ([place])   . Diverticulosis of colon    on colonoscopy 2008  . GERD (gastroesophageal reflux disease)    bravo pH study 2008  . Gout   . H/O aortic valve replacement with porcine valve    2006, 2013  . Headache(784.0)   . Hemorrhoids    external and internal  . Hiatal hernia   . HTN (hypertension)   . Hx of echocardiogram    a. Echo  (post AVR) 12/2011:  mod LVH, EF 60-65%, Gr 2 diast dysfn, mild AI, AVR ok (mean gradient 19 mmHg), MAC, mild BAE  . Hyperlipidemia   .  Hypertension   . Hyperthyroidism   . IBS (irritable bowel syndrome)   . Insomnia   . Lower GI bleed 07/16/2015  . OA (osteoarthritis)   . OSA (obstructive sleep apnea)    USES CPAP AS NEEDED  . Periodontitis    chronic with bone loss  . Restless leg syndrome   . S/P aortic valve replacement with bioprosthetic valve 12/06/2011   Redo AVR using 23 mm [facility] Ease pericardial tissue valve    Patient Active Problem List   Diagnosis Date Noted  . Rectal bleeding   . Internal bleeding hemorrhoids   . Absolute anemia 07/16/2015  . GI bleed 07/16/2015  . Hemorrhoids 07/16/2015  . Multiple myeloma not having achieved remission ([place])   . Syncope and collapse   . Acute blood loss anemia 03/17/2015  . Bruit 01/19/2015  . S/P aortic valve replacement with bioprosthetic valve 12/06/2011  . Aortic stenosis 11/30/2011  . Chronic daily headache 11/30/2011  . Iron deficiency anemia, unspecified 09/15/2011  . IBS (irritable bowel syndrome) 09/15/2011  . Syncope 08/29/2011  . Cough syncope 10/13/2010  . HYPERLIPIDEMIA-MIXED 07/02/2008  . Essential hypertension 07/02/2008  . GERD 07/02/2008  . OBSTRUCTIVE SLEEP APNEA 01/15/2007  . ALLERGIC  RHINITIS 01/15/2007  . INSOMNIA 01/15/2007  .  OSTEOARTHRITIS 12/02/2006  . BPH (benign prostatic hyperplasia) 12/02/2006  . H/O aortic valve replacement 12/02/2006    Past Surgical History:  Procedure Laterality Date  . AORTA - FEMORAL ARTERY BYPASS GRAFT    . AORTIC VALVE REPLACEMENT  10/13/2004   37m [place] pericardial tissue valve  . AORTIC VALVE REPLACEMENT  12/06/2011   Procedure: REDO AORTIC VALVE REPLACEMENT (AVR);  Surgeon: CRexene Alberts MD;  [place]  Service: Open Heart Surgery;  Laterality: N/A;  . BUNIONECTOMY     right  . CARDIAC SURGERY     aorta vavle replacement  . CATARACT EXTRACTION  2009    &   2012   BILATERAL  . COLONOSCOPY  08/31/2011   Procedure: COLONOSCOPY;  Surgeon: RInda Castle MD;   [place]  Service: Endoscopy;  Laterality: N/A;  . ESOPHAGOGASTRODUODENOSCOPY  08/30/2011   Procedure: ESOPHAGOGASTRODUODENOSCOPY (EGD);  Surgeon: RInda Castle MD;  [place]  Service: Endoscopy;  Laterality: N/A;  Rm 3005   . HERNIA REPAIR    . JOINT REPLACEMENT     knee  . LEFT HEART CATHETERIZATION WITH CORONARY ANGIOGRAM N/A 11/30/2011   Procedure: LEFT HEART CATHETERIZATION WITH CORONARY ANGIOGRAM;  Surgeon: CBurnell Blanks MD;  [facility];  Service: Cardiovascular;  Laterality: N/A;  . NASAL SEPTOPLASTY W/ TURBINOPLASTY    . REFRACTIVE SURGERY     bilateral  . RIGHT HEART CATHETERIZATION Bilateral 11/30/2011   Procedure: RIGHT HEART CATH;  Surgeon: CBurnell Blanks MD;  [facility];  Service: Cardiovascular;  Laterality: Bilateral;  . right knee arthroscopy         Home Medications    Prior to Admission medications   Medication Sig Start Date End Date Taking? Authorizing Provider  acyclovir (ZOVIRAX) 800 MG tablet Take 0.5 tablets by mouth 2 (two) times daily.    Historical Provider, MD  carbidopa-levodopa (SINEMET IR) 25-100 MG tablet Take 3 tablets by mouth 4 (four) times daily.     Historical Provider, MD  carboxymethylcellulose 1 % ophthalmic solution Apply 1 drop to eye 3 (three) times daily as needed (dry eyes).    Historical Provider, MD  cetirizine (ZYRTEC) 10 MG chewable tablet Chew 10 mg by mouth 2 (two) times daily as needed for other. At the onset of tongue swelling/angioedema 09/24/15   Historical Provider, MD  cyclobenzaprine (FLEXERIL) 10 MG tablet Take 5 mg by mouth 3 (three) times daily as needed for muscle spasms.    Historical Provider, MD  dexamethasone (DECADRON) 4 MG tablet Take 40 mg by mouth once a week. Take ten tablets (443m every Wednesday with pomalidomide.    Historical Provider, MD  diazepam (VALIUM) 2 MG tablet Take 2 mg by mouth at bedtime as needed for other. Restless leg     Historical Provider, MD  docusate sodium (COLACE) 100 MG capsule Take 100 mg by mouth 2 (two) times daily. 08/20/15   Historical Provider, MD  ferrous sulfate 325 (65 FE) MG tablet Take 1 tablet (325 mg total) by mouth at bedtime. Reported on 07/16/2015 07/16/15   Ripudeep K Krystal EatonMD  finasteride (PROSCAR) 5 MG tablet Take 5 mg by mouth daily.    Historical Provider, MD  fish oil-omega-3 fatty acids 1000 MG capsule Take 1 g by mouth daily.     Historical Provider, MD  folic acid (FOLVITE) 1 MG tablet Take 1 mg by mouth daily. 09/24/15   Historical Provider, MD  gabapentin (NEURONTIN) 800 MG  tablet Take 800 mg by mouth 3 (three) times daily.    Historical Provider, MD  iron sucrose (VENOFER) 20 MG/ML injection Inject 200 mg into the vein once a week. 09/24/15   Historical Provider, MD  ketoconazole (NIZORAL) 2 % cream Apply 1 application topically daily as needed for irritation.    Historical Provider, MD  metFORMIN (GLUCOPHAGE) 500 MG tablet Take 500 mg by mouth 2 (two) times daily with a meal.    Historical Provider, MD  methylcellulose (ARTIFICIAL TEARS) 1 % ophthalmic solution Place 1 drop into both eyes at bedtime as needed (dry eyes).    Historical Provider, MD  pantoprazole (PROTONIX) 40 MG tablet Take 1 tablet (40 mg total) by mouth daily. For GERD 07/16/15   Ripudeep Krystal Eaton, MD  polycarbophil (FIBERCON) 625 MG tablet Take 2 tablets by mouth daily.    Historical Provider, MD  pomalidomide (POMALYST) 4 MG capsule Take 4 mg by mouth daily. Take with water on days 1-21. Repeat every 28 days.    Historical Provider, MD  pravastatin (PRAVACHOL) 40 MG tablet Take 1 tablet (40 mg total) by mouth every evening. 01/19/15   Lelon Perla, MD  simethicone (MYLICON) 80 MG chewable tablet Chew 80 mg by mouth every 6 (six) hours as needed for flatulence.    Historical Provider, MD  simvastatin (ZOCOR) 40 MG tablet Take 20 mg by mouth daily. 09/24/15   Historical Provider, MD  zinc oxide 20 % ointment Apply 1  application topically 2 (two) times daily as needed for irritation.    Historical Provider, MD    Family History Family History  Problem Relation Age of Onset  . Heart disease Father   . Osteoarthritis Mother   . Hypertension Sister   . Hyperlipidemia      Social History Social History  Substance Use Topics  . Smoking status: Former Smoker    Quit date: 09/30/1971  . Smokeless tobacco: Never Used  . Alcohol use No     Allergies   Spironolactone; Avodart [dutasteride]; Avodart [dutasteride]; Codeine; Diazepam; Diazepam; Doxazosin; Doxazosin; Feraheme [ferumoxytol]; and Spironolactone   Review of Systems Review of Systems  All other systems reviewed and are negative.    Physical Exam Updated Vital Signs BP 124/66 (BP Location: Left Arm)   Pulse 93   Temp 97.6 F (36.4 C) (Oral)   Resp 20   SpO2 100%   Physical Exam  Constitutional: He appears well-developed and well-nourished. No distress.  HENT:  Head: Atraumatic.  Eyes: EOM are normal. Pupils are equal, round, and reactive to light.  Conjunctiva pale  Neck: Neck supple. No JVD present.  Cardiovascular: Normal rate and regular rhythm.   Murmur heard. Pulmonary/Chest: Effort normal and breath sounds normal.  Abdominal: Soft. There is no tenderness.  Genitourinary:  Genitourinary Comments: Chaperone present during exam.  Non thrombosed external hemorrhoid, no rectal mass, no frank bleeding, no stool impaction.  No anal fissure, no bleeding hemorrhoid.  Dark color stool.  Hemoccult +  Musculoskeletal:  No significant cervical midline spine tenderness crepitus or step-off.  Neurological: He is alert.  Global weakness without focal weakness.  Skin: No rash noted. There is pallor.  Psychiatric: He has a normal mood and affect.  Nursing note and vitals reviewed.    ED Treatments / Results  Labs (all labs ordered are listed, but only abnormal results are displayed) Labs Reviewed  BASIC METABOLIC PANEL -  Abnormal; Notable for the following:       Result Value  Sodium 132 (*)    Glucose, Bld 239 (*)    BUN 22 (*)    Creatinine, Ser 0.59 (*)    All other components within normal limits  CBC - Abnormal; Notable for the following:    RBC 1.88 (*)    Hemoglobin 5.0 (*)    HCT 16.8 (*)    MCHC 29.8 (*)    RDW 18.2 (*)    All other components within normal limits  CBG MONITORING, ED - Abnormal; Notable for the following:    Glucose-Capillary 235 (*)    All other components within normal limits  POC OCCULT BLOOD, ED - Abnormal; Notable for the following:    Fecal Occult Bld POSITIVE (*)    All other components within normal limits  URINALYSIS, ROUTINE W REFLEX MICROSCOPIC (NOT AT [facility])  TYPE AND SCREEN  PREPARE RBC (CROSSMATCH)    EKG  EKG Interpretation None       Radiology No results found.  Procedures Procedures (including critical care time)  Medications Ordered in ED Medications  0.9 %  sodium chloride infusion (0 mL/hr Intravenous Stopped 10/14/15 2111)     Initial Impression / Assessment and Plan / ED Course  I have reviewed the triage vital signs and the nursing notes.  Pertinent labs & imaging results that were available during my care of the patient were reviewed by me and considered in my medical decision making (see chart for details).  Clinical Course   BP 92/55   Pulse 81   Temp 97.6 F (36.4 C) (Oral)   Resp 23   SpO2 100%    Final Clinical Impressions(s) / ED Diagnoses   Final diagnoses:  Symptomatic anemia  Chronic GI bleeding  Syncope and collapse    New Prescriptions New Prescriptions   No medications on file   8:27 PM Pt with hx of Multiple Myeloma requiring regular blood transfusion is presenting today with a witnessed syncopal episode by wife.  No injury from the syncopal episode.  He is pale appearing.  Current Hgb 5.0.  BP soft at 92/55.  Suspect symptomatic anemia 2/2 Multiple Myeloma.  Pt with global weakness but without focal  weakness.  No active CP/SOB.  Plan to transfuse blood and admit for continuous blood transfusion. Care discussed with Dr. Bland Span.    8:37 PM Pt also has hx of chronic GI bleed from either internal hemorrhoid or anal fissure.  He denies any abnormal bleeding or having black tarry stool.  No report of abd pain.  Does have black color stool, FOBT+.    9:36 PM Appreciate consultation from Internal Medicine resident who agrees to see pt in the ER and will admit to telemetry bed under the care of attending Dr. Evette Doffing.    CRITICAL CARE Performed by: Domenic Moras Total critical care time: 40 minutes Critical care time was exclusive of separately billable procedures and treating other patients. Critical care was necessary to treat or prevent imminent or life-threatening deterioration. Critical care was time spent personally by me on the following activities: development of treatment plan with patient and/or surrogate as well as nursing, discussions with consultants, evaluation of patient's response to treatment, examination of patient, obtaining history from patient or surrogate, ordering and performing treatments and interventions, ordering and review of laboratory studies, ordering and review of radiographic studies, pulse oximetry and re-evaluation of patient's condition.    Domenic Moras, PA-C 10/14/15 2137    Duffy Bruce, MD 10/16/15 [phone]

## 2015-10-14 NOTE — ED Triage Notes (Signed)
Per ems- c/o syncopal episode at home lasting 1-2 minutes witnessed by wife. Pt's wife was able to assist him to ground. Has multiple myeloma, with scheduled blood transfusion tomorrow. Has hx of syncopal episodes in past. Denies any new pain, c/o nausea relieved by lying flat. BP 110/54, HR 80. Pt is a x 4. Unable to get IV. CBG 222.

## 2015-10-15 DIAGNOSIS — Z954 Presence of other heart-valve replacement: Secondary | ICD-10-CM

## 2015-10-15 DIAGNOSIS — Z794 Long term (current) use of insulin: Secondary | ICD-10-CM

## 2015-10-15 LAB — HEPATIC FUNCTION PANEL
ALBUMIN: 2.6 g/dL — AB (ref 3.5–5.0)
ALK PHOS: 80 U/L (ref 38–126)
ALT: 19 U/L (ref 17–63)
AST: 29 U/L (ref 15–41)
Bilirubin, Direct: 0.2 mg/dL (ref 0.1–0.5)
Indirect Bilirubin: 1 mg/dL — ABNORMAL HIGH (ref 0.3–0.9)
TOTAL PROTEIN: 5.3 g/dL — AB (ref 6.5–8.1)
Total Bilirubin: 1.2 mg/dL (ref 0.3–1.2)

## 2015-10-15 LAB — PROTIME-INR
INR: 1.15
Prothrombin Time: 14.8 seconds (ref 11.4–15.2)

## 2015-10-15 LAB — BASIC METABOLIC PANEL
Anion gap: 4 — ABNORMAL LOW (ref 5–15)
BUN: 18 mg/dL (ref 6–20)
CALCIUM: 9.1 mg/dL (ref 8.9–10.3)
CO2: 26 mmol/L (ref 22–32)
CREATININE: 0.5 mg/dL — AB (ref 0.61–1.24)
Chloride: 104 mmol/L (ref 101–111)
GFR calc Af Amer: >60 mL/min (ref >60)
GLUCOSE: 217 mg/dL — AB (ref 65–99)
POTASSIUM: 3.6 mmol/L (ref 3.5–5.1)
SODIUM: 134 mmol/L — AB (ref 135–145)

## 2015-10-15 LAB — DIRECT ANTIGLOBULIN TEST (NOT AT ARMC)
DAT, IgG: NEGATIVE
DAT, complement: NEGATIVE

## 2015-10-15 LAB — APTT: aPTT: 28 seconds (ref 24–36)

## 2015-10-15 LAB — HEMOGLOBIN AND HEMATOCRIT, BLOOD
HEMATOCRIT: 23.7 % — AB (ref 39.0–52.0)
HEMOGLOBIN: 7.3 g/dL — AB (ref 13.0–17.0)

## 2015-10-15 LAB — CBC
HCT: 18.2 % — ABNORMAL LOW (ref 39.0–52.0)
HEMATOCRIT: 18.9 % — AB (ref 39.0–52.0)
HEMOGLOBIN: 5.7 g/dL — AB (ref 13.0–17.0)
Hemoglobin: 5.5 g/dL — CL (ref 13.0–17.0)
MCH: 26.4 pg (ref 26.0–34.0)
MCH: 26.3 pg (ref 26.0–34.0)
MCHC: 30.2 g/dL (ref 30.0–36.0)
MCHC: 30.2 g/dL (ref 30.0–36.0)
MCV: 87.5 fL (ref 78.0–100.0)
MCV: 87.1 fL (ref 78.0–100.0)
Platelets: 128 10*3/uL — ABNORMAL LOW (ref 150–400)
Platelets: 157 10*3/uL (ref 150–400)
RBC: 2.08 MIL/uL — ABNORMAL LOW (ref 4.22–5.81)
RBC: 2.17 MIL/uL — AB (ref 4.22–5.81)
RDW: 18.4 % — ABNORMAL HIGH (ref 11.5–15.5)
RDW: 18.6 % — ABNORMAL HIGH (ref 11.5–15.5)
WBC: 3.5 10*3/uL — ABNORMAL LOW (ref 4.0–10.5)
WBC: 3.3 10*3/uL — AB (ref 4.0–10.5)

## 2015-10-15 LAB — LACTATE DEHYDROGENASE: LDH: 207 U/L — ABNORMAL HIGH (ref 98–192)

## 2015-10-15 LAB — GLUCOSE, CAPILLARY
GLUCOSE-CAPILLARY: 139 mg/dL — AB (ref 65–99)
Glucose-Capillary: 181 mg/dL — ABNORMAL HIGH (ref 65–99)
Glucose-Capillary: 186 mg/dL — ABNORMAL HIGH (ref 65–99)
Glucose-Capillary: 261 mg/dL — ABNORMAL HIGH (ref 65–99)

## 2015-10-15 LAB — SAVE SMEAR

## 2015-10-15 LAB — PREPARE RBC (CROSSMATCH)

## 2015-10-15 MED ORDER — SODIUM CHLORIDE 0.9 % IV SOLN
Freq: Once | INTRAVENOUS | Status: AC
  Administered 2015-10-15: 07:00:00 via INTRAVENOUS

## 2015-10-15 MED ORDER — GABAPENTIN 400 MG PO CAPS
800.0000 mg | ORAL_CAPSULE | Freq: Three times a day (TID) | ORAL | Status: DC
  Administered 2015-10-15 – 2015-10-18 (×10): 800 mg via ORAL
  Filled 2015-10-15 (×10): qty 2

## 2015-10-15 MED ORDER — CARBIDOPA-LEVODOPA 25-100 MG PO TABS
3.0000 | ORAL_TABLET | Freq: Four times a day (QID) | ORAL | Status: DC
Start: 2015-10-15 — End: 2015-10-18
  Administered 2015-10-15 – 2015-10-18 (×12): 3 via ORAL
  Filled 2015-10-15 (×12): qty 3

## 2015-10-15 MED ORDER — GABAPENTIN 800 MG PO TABS
800.0000 mg | ORAL_TABLET | Freq: Three times a day (TID) | ORAL | Status: DC
  Filled 2015-10-15: qty 1

## 2015-10-15 NOTE — Progress Notes (Signed)
   Subjective: Feels about the same today, tired but no chest pain or dyspnea at rest.  No signs of bleeding or hemolysis, including frequent bowel movements, hematochezia, hematuria, dark urine, or cutaneous bleeding.  No back/flank tenderness.   Objective:  Vital signs in last 24 hours: Vitals:   10/15/15 0319 10/15/15 0638 10/15/15 0640 10/15/15 0642  BP: (!) 100/44 (!) 104/52 (!) 112/59 (!) 103/52  Pulse: 75 74 73 77  Resp: 18 17    Temp: 98.7 F (37.1 C) 98.2 F (36.8 C)    TempSrc: Oral Oral    SpO2: 100% 96% 97% 97%  Weight:      Height:       Physical Exam  Constitutional: He is oriented to person, place, and time.  Pale, alert, in no distress  Eyes:  Conjunctival pallor No scleral icterus  Cardiovascular:  Regular rate and rhythm, 3/6 systolic murmur  Pulmonary/Chest: Effort normal and breath sounds normal.  Abdominal: Soft. He exhibits no distension. There is no tenderness.  Musculoskeletal: He exhibits no edema or tenderness.  Neurological: He is alert and oriented to person, place, and time.  Skin:  Pale, slightly cool to touch  Psychiatric: He has a normal mood and affect. His behavior is normal.   CBC Latest Ref Rng & Units 10/15/2015 10/14/2015 09/25/2015  WBC 4.0 - 10.5 K/uL 3.5(L) 4.2 7.0  Hemoglobin 13.0 - 17.0 g/dL 5.5(LL) 5.0(LL) 7.3(L)  Hematocrit 39.0 - 52.0 % 18.2(L) 16.8(L) 23.5(L)  Platelets 150 - 400 K/uL 128(L) 168 141(L)   Reviewed peripheral blood smear.  Rare schistocytes (<1%), heterochromasia.  Assessment/Plan:  Active Problems:   H/O aortic valve replacement   Syncope   Multiple myeloma not having achieved remission ([place])   Hemorrhoids  #Anemia Lower than expected response to transfusion overnight, with Hgb 5.0 -> 5.5 after 2U blood overnight.  No clinical signs of ongoing GI bleeding, no flank bruising or tenderness, no thigh discoloration or tenderness.  Hgb target 7.0. -Hemolysis labs (DAT, haptoglobin, bili, LDH) -Coags -Review  smear for schistos -Transfuse 2U pRBCs -Post-transfusion CBC  #Multiple Myeloma -Continue home pomalidomide, acyclovir, folic acid -Dexamethasome weekly (Wednesday)  #DM -SSI  Dispo: Anticipated discharge in approximately 1 day.   Minus Liberty, MD 10/15/2015, 6:58 AM Pager: [phone]

## 2015-10-15 NOTE — Progress Notes (Signed)
Pt was admitted per stretcher accompanied by nurse tech and pt wife, self introduced to pt and wife, ID bracelet checked, fall prevention plan discussed with pt, admission package given, he is oriented and oriented to pt care equipment, call light and phone within reach and pt able to demonstrate how to use it, treatment started as prescribed, will continue to monitor

## 2015-10-15 NOTE — Care Management Note (Signed)
Case Management Note  Patient Details  Name: [name] MRN: [id number] Date of Birth: 21-Jul-1945  Subjective/Objective:                 Patient from home with wife. [facility]CP [facility]. Uses [facility], or [facility]. [facility] still active for a few more months per patient's wife. They would to have rolator prior to DC. Patient not active with HH, declines [facility] services. Readmissions related to chronic disease state, [place].    Action/Plan:  ANticipate DC to home when medically stable.   Expected Discharge Date:                  Expected Discharge Plan:  Home/Self Care  In-House Referral:  NA  Discharge planning Services  CM Consult  Post Acute Care Choice:  Durable Medical Equipment Choice offered to:  Patient  DME Arranged:  Walker rolling with seat DME Agency:  NA  HH Arranged:  NA HH Agency:  NA  Status of Service:  Completed, signed off  If discussed at [place] of Stay Meetings, dates discussed:    Additional Comments:  [name], RN 10/15/2015, 1:27 PM

## 2015-10-15 NOTE — Progress Notes (Signed)
Paged MD and made aware of post transfusion hgb and hct. Awaiting orders.

## 2015-10-15 NOTE — Discharge Summary (Signed)
Name: [name] MRN: [id number] DOB: 03-22-1945 70 y.o. PCP: Buzzy Han, MD  Date of Admission: 10/14/2015  6:51 PM Date of Discharge: 10/18/2015 Attending Physician: Axel Filler, MD  Discharge Diagnosis: 1. Symptomatic anemia  Principal Problem:   Symptomatic anemia Active Problems:   H/O aortic valve replacement   Syncope   Multiple myeloma not having achieved remission (HCC)   Hemorrhoids   Discharge Medications:   Medication List    TAKE these medications   acyclovir 800 MG tablet Commonly known as:  ZOVIRAX Take 0.5 tablets by mouth 2 (two) times daily.   aspirin EC 81 MG tablet Take 81 mg by mouth daily.   carbidopa-levodopa 25-100 MG tablet Commonly known as:  SINEMET IR Take 3 tablets by mouth 4 (four) times daily.   cetirizine 10 MG chewable tablet Commonly known as:  ZYRTEC Chew 10 mg by mouth 2 (two) times daily as needed for other. At the onset of tongue swelling/angioedema   cyclobenzaprine 10 MG tablet Commonly known as:  FLEXERIL Take 5 mg by mouth 3 (three) times daily as needed for muscle spasms.   dexamethasone 4 MG tablet Commonly known as:  DECADRON Take 40 mg by mouth once a week. Take ten tablets (58m) every Wednesday with pomalidomide.   diazepam 2 MG tablet Commonly known as:  VALIUM Take 2 mg by mouth at bedtime as needed for other. Restless leg   docusate sodium 100 MG capsule Commonly known as:  COLACE Take 100 mg by mouth 2 (two) times daily as needed for moderate constipation.   ferrous sulfate 325 (65 FE) MG tablet Take 1 tablet (325 mg total) by mouth at bedtime. Reported on 07/16/2015   FIBERCON 625 MG tablet Generic drug:  polycarbophil Take 2 tablets by mouth daily.   finasteride 5 MG tablet Commonly known as:  PROSCAR Take 5 mg by mouth daily.   fish oil-omega-3 fatty acids 1000 MG capsule Take 1 g by mouth daily.   folic acid 1 MG tablet Commonly known as:  FOLVITE Take 1 mg by mouth  daily.   gabapentin 800 MG tablet Commonly known as:  NEURONTIN Take 800 mg by mouth 3 (three) times daily.   iron sucrose 20 MG/ML injection Commonly known as:  VENOFER Inject 200 mg into the vein once a week.   ketoconazole 2 % cream Commonly known as:  NIZORAL Apply 1 application topically daily as needed for irritation.   metFORMIN 500 MG tablet Commonly known as:  GLUCOPHAGE Take 500 mg by mouth 2 (two) times daily with a meal.   methylcellulose 1 % ophthalmic solution Commonly known as:  ARTIFICIAL TEARS Place 1 drop into both eyes at bedtime as needed (dry eyes).   pantoprazole 40 MG tablet Commonly known as:  PROTONIX Take 1 tablet (40 mg total) by mouth daily. For GERD   pomalidomide 4 MG capsule Commonly known as:  POMALYST Take 4 mg by mouth daily. Take with water on days 1-21. PATIENT IS OFF MED FOR 7 DAYS, Repeat every 28 days.   pravastatin 40 MG tablet Commonly known as:  PRAVACHOL Take 1 tablet (40 mg total) by mouth every evening.   simethicone 80 MG chewable tablet Commonly known as:  MYLICON Chew 80 mg by mouth every 6 (six) hours as needed for flatulence.   simvastatin 40 MG tablet Commonly known as:  ZOCOR Take 20 mg by mouth daily.   zinc oxide 20 % ointment Apply 1 application topically 2 (two)  times daily as needed for irritation.       Disposition and follow-up:   [name] was discharged from [facility] in Good condition.  At the hospital follow up visit please address:  1.  Refractory Symptomatic Anemia.    2.  Labs / imaging needed at time of follow-up: Hgb  3.  Pending labs/ test needing follow-up: Plasma free hemoglobin, erythropoeitin, urine hemosiderin  Follow-up Appointments: Follow-up [facility] .   Why:  [employer] to be delivered to room prior to [employer] information: [address] [place] [address] [phone]        Reola Calkins,  MD. Go in 1 day(s).   Specialty:  Hematology and Oncology Contact information: [place] [address] [phone]           Hospital Course by problem list: Principal Problem:   Symptomatic anemia Active Problems:   H/O aortic valve replacement   Syncope   Multiple myeloma not having achieved remission (HCC)   Hemorrhoids   1. Symptomatic anemia [name] was admitted following a syncopal episode at home when he was found to have a Hgb of 5.0.  He has had recurrent symptomatic anemia for the last several months since being diagnosed with multiple myeloma, and has been dependent on weekly transfusions.  There has been concern for a chronic GI bleed, with recent colonoscopy and EGD through the [facility] and plans for capsule endoscopy.  He initially received 2U pRBCs but his Hgb did not respond.  He was further transfused, receiving a total of 6U pRBCs over the course of admission, until his Hgb was stable and above a goal of 8.0 mg/dL.  His stool was hemoccult positive, but his history was not consistent with profuse GI bleeding, with no hematemesis, hematochezzia, melena, or frequent BMs.  In consultation with hematologist Dr Alvy Bimler, the possibility of hemolysis contributing to his refractory anemia was investigated.  His haptoglobin was low, but LDH and bilirubin normal.  Plasma free hemoglobin, erythropoietin, and urine hemosiderin were pending at time of discharge.  With his loud systolic murmur, paravalvular leak around his prosthetic aortic valve as a potential cause of mechanical hemolysis was considered, but echo from 01/2015 showed a properly functioning valve.  His symptoms of lightheadedness and fatigue improved, and he was ready for discharge on 9/10 after his Hgb was stable for a day after last transfusion.  He had previously scheduled follow-up on 9/11 at [facility] oncology which he will keep.  2. Multiple myeloma On CHECKMATE trial through [facility], receiving  pomalidomide 8m daily and 40 mg dexamethasone PO weekly.  His oncology treatment was continued unchanging during the admission.  Discharge Vitals:   BP (!) 110/59 (BP Location: Right Arm)   Pulse (!) 54   Temp 98.6 F (37 C) (Oral)   Resp 16   Ht 5' 9"  (1.753 m)   Wt 187 lb 12.8 oz (85.2 kg)   SpO2 98%   BMI 27.73 kg/m   Pertinent Labs, Studies, and Procedures:  Component     Latest Ref Rng & Units 10/14/2015 10/15/2015 10/15/2015 10/15/2015 10/16/2015          4:29 AM  7:23 AM  7:38 PM  6:54 AM  Hemoglobin     13.0 - 17.0 g/dL 5.0 (LL) 5.5 (LL) 5.7 (LL) 7.3 (L) 7.4 (L)   Component     Latest Ref Rng & Units  10/16/2015 10/17/2015 10/17/2015 10/18/2015         8:35 PM  5:38 AM  6:19 PM   Hemoglobin     13.0 - 17.0 g/dL 7.9 (L) 8.9 (L) 9.5 (L) 9.7 (L)    Discharge Instructions: Discharge Instructions    Diet - low sodium heart healthy    Complete by:  As directed   Increase activity slowly    Complete by:  As directed     You were admitted to the hospital for anemia (low red blood cells), which can cause you to feel weak and pass out.  We transfused you with a total of 6 units of blood, and made sure your blood counts did not continue to drop.  We found some evidence of hemolysis (destruction of blood cells), but it is not clear if this is enough to explain why your blood counts keep dropping.  Please keep your follow-up appointments at [facility] tomorrow (Monday 9/11) and the [facility] on Tuesday (9/12) as scheduled.  It is important to have your blood checked to make sure your blood counts are staying up.   Dr Alvy Bimler, the hematologist you saw here, will call you next week to follow-up with pending test results.   If you feel short of breath, have chest pain, or become more tired or lightheaded, please return to the ED.  If you have any bleeding, including coughing up blood, vomiting blood, having blood with bowel movements, or having dark, black, sticky bowel movements, come to the  ED.    Signed: Minus Liberty, MD 10/18/2015, 9:38 AM   Pager: [phone]

## 2015-10-16 ENCOUNTER — Other Ambulatory Visit: Payer: Self-pay | Admitting: Hematology and Oncology

## 2015-10-16 DIAGNOSIS — R011 Cardiac murmur, unspecified: Secondary | ICD-10-CM

## 2015-10-16 DIAGNOSIS — D649 Anemia, unspecified: Secondary | ICD-10-CM

## 2015-10-16 DIAGNOSIS — D72819 Decreased white blood cell count, unspecified: Secondary | ICD-10-CM

## 2015-10-16 DIAGNOSIS — E43 Unspecified severe protein-calorie malnutrition: Secondary | ICD-10-CM

## 2015-10-16 DIAGNOSIS — C9000 Multiple myeloma not having achieved remission: Secondary | ICD-10-CM

## 2015-10-16 DIAGNOSIS — D464 Refractory anemia, unspecified: Secondary | ICD-10-CM

## 2015-10-16 LAB — PREPARE RBC (CROSSMATCH)

## 2015-10-16 LAB — CBC
HEMATOCRIT: 23.3 % — AB (ref 39.0–52.0)
HEMOGLOBIN: 7.4 g/dL — AB (ref 13.0–17.0)
MCH: 27.4 pg (ref 26.0–34.0)
MCHC: 31.8 g/dL (ref 30.0–36.0)
MCV: 86.3 fL (ref 78.0–100.0)
Platelets: 149 10*3/uL — ABNORMAL LOW (ref 150–400)
RBC: 2.7 MIL/uL — ABNORMAL LOW (ref 4.22–5.81)
RDW: 18.5 % — AB (ref 11.5–15.5)
WBC: 3.1 10*3/uL — AB (ref 4.0–10.5)

## 2015-10-16 LAB — GLUCOSE, CAPILLARY
GLUCOSE-CAPILLARY: 135 mg/dL — AB (ref 65–99)
GLUCOSE-CAPILLARY: 165 mg/dL — AB (ref 65–99)
GLUCOSE-CAPILLARY: 159 mg/dL — AB (ref 65–99)
Glucose-Capillary: 178 mg/dL — ABNORMAL HIGH (ref 65–99)
Glucose-Capillary: 215 mg/dL — ABNORMAL HIGH (ref 65–99)

## 2015-10-16 LAB — HEMOGLOBIN AND HEMATOCRIT, BLOOD
HCT: 25 % — ABNORMAL LOW (ref 39.0–52.0)
Hemoglobin: 7.9 g/dL — ABNORMAL LOW (ref 13.0–17.0)

## 2015-10-16 LAB — HAPTOGLOBIN

## 2015-10-16 MED ORDER — SODIUM CHLORIDE 0.9 % IV SOLN
300.0000 mg | Freq: Once | INTRAVENOUS | Status: AC
  Administered 2015-10-16: 300 mg via INTRAVENOUS
  Filled 2015-10-16: qty 15

## 2015-10-16 MED ORDER — SODIUM CHLORIDE 0.9 % IV SOLN: 510.0000 mg | Freq: Once | INTRAVENOUS | Status: DC

## 2015-10-16 MED ORDER — SODIUM CHLORIDE 0.9 % IV SOLN: Freq: Once | INTRAVENOUS | Status: DC

## 2015-10-16 MED ORDER — SODIUM CHLORIDE 0.9 % IV SOLN
Freq: Once | INTRAVENOUS | Status: AC
  Administered 2015-10-16: 16:00:00 via INTRAVENOUS

## 2015-10-16 NOTE — Consult Note (Signed)
[facility] NOTE  Patient Care Team: Buzzy Han, MD as PCP - General (Family Medicine) Gaynelle Arabian, MD (Family Medicine)  CHIEF COMPLAINTS/PURPOSE OF CONSULTATION:  Severe anemia, on background history of multiple myeloma on clinical trial at [place]:  [name] 70 y.o. male has recurrent admission over the past month for recurrent, severe anemia. I have the opportunity to review his electronic records extensively as well as outside records. The patient has been receiving his care through the [facility] facility and [facility]. He presented to the hospital in February due to syncopal episode. He has never seen an oncologist here at [facility]. 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 - TRILINEAGE HEMATOPOIESIS. - SEE COMMENT. PERIPHERAL BLOOD: - MACROCYTIC ANEMIA - MILD NEUTROPHILIC LEFT SHIFT.  The patient subsequently transferred his oncology care to [facility]. TREATMENT HISTORY:  Lenalidomide/velcade/ dexamethasone 21 out of 28 days started on 04/09/2015; Monthly Zometa started on same day.  Cycle 5 delayed due to epididymitis/orchitis  Therapy changed to cyclophosphamide/bortezomib/dexamethasone 21 out of 28 days on 07/10/2015 due to disease progression  Day 8 of Cycle 1 of Cy/Bor/D held D/t hospitalization for bleeding hemorrhoids  Cycle 2 of Cy/Bor/D started on 08/07/15; Remainder of Cycle 2 of Cy/Bor/D held d/t hospitalization for refractory anemia on 08/14/2015.  Pomalidomide/dexamethasone started on 09/22/2015 per CHECKMATE clinical trial d/t progressive disease   On 7/11, he was  admitted to [facility] for symptomatic anemia requiring 5 units of pRBC's as outpatient since 08/14/15. On arrival he was hemodynamically stable and breathing comfortably on room air; labwork notable for Hgb 7.4 with ferritin 8; transfused 2u pRBC's and started on IV and PO iron. Further workup included a bone marrow biopsy done on 08/19/2015 at [facility]. Bone marrow biopsy showed plasma cells accounting for approximately 20% of the total cellularity. Treatment options were discussed and he was screened and later enrolled in CHECKMATE Clinical Trial. He was started on therapy 09/22/2015, randomized to the pomalidomide/dexamethasone arm  Since then, the patient continues to be transfusion dependent requiring numerous units of blood transfusions and recurrent hospitalization. The patient complained of significant symptoms of anemia with shortness of breath and dizziness. The patient denies any recent signs or symptoms of bleeding such as spontaneous epistaxis, hematuria or hematochezia. He complained of passage of dark urine in the morning. He had extensive EGD and colonoscopy done without signs of GI bleed. CT scan of the abdomen done elsewhere show no evidence of retroperitoneal bleed  The patient was subsequently discharged from outside facility. He presented to [facility] Emergency department 2 days ago with hemoglobin of 5 and has received multiple units of blood transfusion since admission to the hospital. Further workup revealed signs of intravascular hemolysis. Coombs tests were negative. Currently, the patient complained of shortness of breath but denies chest pain.  MEDICAL HISTORY:  Past Medical History:  Diagnosis Date  . Allergic rhinitis   . Anemia 2009  . Aortic stenosis    a. s/p porcine AVR 2006;  b. s/p redo tissue AVR 12/2011 (Dr. Roxy Manns) - preAVR LHC with no CAD  . Asthma   . BPH (benign prostatic hypertrophy)   . Cancer [facility])    multiple  myeloma  . Cataract   . Chronic cough   . Chronic  interstitial cystitis   . Depression    pt denies  . Diabetes mellitus    type 2  . Diabetes mellitus without complication ([place])   . Diverticulosis of colon    on colonoscopy 2008  . GERD (gastroesophageal reflux disease)    bravo pH study 2008  . Gout   . H/O aortic valve replacement with porcine valve    2006, 2013  . Headache(784.0)   . Hemorrhoids    external and internal  . Hiatal hernia   . HTN (hypertension)   . Hx of echocardiogram    a. Echo  (post AVR) 12/2011:  mod LVH, EF 60-65%, Gr 2 diast dysfn, mild AI, AVR ok (mean gradient 19 mmHg), MAC, mild BAE  . Hyperlipidemia   . Hypertension   . Hyperthyroidism   . IBS (irritable bowel syndrome)   . Insomnia   . Lower GI bleed 07/16/2015  . OA (osteoarthritis)   . OSA (obstructive sleep apnea)    USES CPAP AS NEEDED  . Periodontitis    chronic with bone loss  . Restless leg syndrome   . S/P aortic valve replacement with bioprosthetic valve 12/06/2011   Redo AVR using 23 mm [facility] Ease pericardial tissue valve    SURGICAL HISTORY: Past Surgical History:  Procedure Laterality Date  . AORTA - FEMORAL ARTERY BYPASS GRAFT    . AORTIC VALVE REPLACEMENT  10/13/2004   26m [place]imount pericardial tissue valve  . AORTIC VALVE REPLACEMENT  12/06/2011   Procedure: REDO AORTIC VALVE REPLACEMENT (AVR);  Surgeon: CRexene Alberts MD;  [place]  Service: Open Heart Surgery;  Laterality: N/A;  . BUNIONECTOMY     right  . CARDIAC SURGERY     aorta vavle replacement  . CATARACT EXTRACTION  2009    &   2012   BILATERAL  . COLONOSCOPY  08/31/2011   Procedure: COLONOSCOPY;  Surgeon: RInda Castle MD;  [place]  Service: Endoscopy;  Laterality: N/A;  . ESOPHAGOGASTRODUODENOSCOPY  08/30/2011   Procedure: ESOPHAGOGASTRODUODENOSCOPY (EGD);  Surgeon: RInda Castle MD;  [place]  Service: Endoscopy;  Laterality: N/A;  Rm 3005   . HERNIA REPAIR    . JOINT REPLACEMENT     knee   . LEFT HEART CATHETERIZATION WITH CORONARY ANGIOGRAM N/A 11/30/2011   Procedure: LEFT HEART CATHETERIZATION WITH CORONARY ANGIOGRAM;  Surgeon: CBurnell Blanks MD;  [facility];  Service: Cardiovascular;  Laterality: N/A;  . NASAL SEPTOPLASTY W/ TURBINOPLASTY    . REFRACTIVE SURGERY     bilateral  . RIGHT HEART CATHETERIZATION Bilateral 11/30/2011   Procedure: RIGHT HEART CATH;  Surgeon: CBurnell Blanks MD;  [facility];  Service: Cardiovascular;  Laterality: Bilateral;  . right knee arthroscopy      SOCIAL HISTORY: Social History   Social History  . Marital status: Married    Spouse name: N/A  . Number of children: 0  . Years of education: N/A   Occupational History  . retired    Social History Main Topics  . Smoking status: Former Smoker    Quit date: 09/30/1971  . Smokeless tobacco: Never Used     Comment: Quit August 973  . Alcohol use No  . Drug use: No  . Sexual activity: Not Currently   Other Topics Concern  . Not on file   Social History Narrative   **  Merged History Encounter **        FAMILY HISTORY: Family History  Problem Relation Age of Onset  . Heart disease Father   . Osteoarthritis Mother   . Hypertension Sister   . Hyperlipidemia    . Cancer Maternal Aunt     ALLERGIES:  is allergic to avodart [dutasteride]; spironolactone; codeine; diazepam; doxazosin; and feraheme [ferumoxytol].  MEDICATIONS:  Current Facility-Administered Medications  Medication Dose Route Frequency Provider Last Rate Last Dose  . acetaminophen (TYLENOL) tablet 650 mg  650 mg Oral Q6H PRN Zada Finders, MD   650 mg at 10/16/15 9924   Or  . acetaminophen (TYLENOL) suppository 650 mg  650 mg Rectal Q6H PRN Zada Finders, MD      . acyclovir (ZOVIRAX) tablet 400 mg  400 mg Oral BID Zada Finders, MD   400 mg at 10/16/15 0937  . carbidopa-levodopa (SINEMET IR) 25-100 MG per tablet immediate release 3 tablet  3 tablet Oral QID Minus Liberty, MD   3 tablet at 10/16/15 1724  . folic acid (FOLVITE) tablet 1 mg  1 mg Oral Daily Zada Finders, MD   1 mg at 10/16/15 0937  . gabapentin (NEURONTIN) capsule 800 mg  800 mg Oral TID Axel Filler, MD   800 mg at 10/16/15 1723  . insulin aspart (novoLOG) injection 0-9 Units  0-9 Units Subcutaneous TID WC Zada Finders, MD   2 Units at 10/16/15 1724  . iron sucrose (VENOFER) 300 mg in sodium chloride 0.9 % 250 mL IVPB  300 mg Intravenous Once Heath Lark, MD      . omega-3 acid ethyl esters (LOVAZA) capsule 1 g  1 g Oral Daily Zada Finders, MD   1 g at 10/16/15 2683  . polyvinyl alcohol (LIQUIFILM TEARS) 1.4 % ophthalmic solution 1 drop  1 drop Both Eyes QHS PRN Zada Finders, MD      . pomalidomide (POMALYST) capsule 4 mg  4 mg Oral Daily Zada Finders, MD   4 mg at 10/16/15 0938  . pravastatin (PRAVACHOL) tablet 40 mg  40 mg Oral QPM Zada Finders, MD   40 mg at 10/16/15 1723  . sodium chloride flush (NS) 0.9 % injection 3 mL  3 mL Intravenous Q12H Zada Finders, MD   3 mL at 10/16/15 0941    REVIEW OF SYSTEMS:   Constitutional: Denies fevers, chills or abnormal night sweats Eyes: Denies blurriness of vision, double vision or watery eyes Ears, nose, mouth, throat, and face: Denies mucositis or sore throat Cardiovascular: Denies palpitation, chest discomfort or lower extremity swelling Gastrointestinal:  Denies nausea, heartburn or change in bowel habits Skin: Denies abnormal skin rashes Lymphatics: Denies new lymphadenopathy or easy bruising Neurological:Denies numbness, tingling or new weaknesses Behavioral/Psych: Mood is stable, no new changes  All other systems were reviewed with the patient and are negative.  PHYSICAL EXAMINATION: ECOG PERFORMANCE STATUS: 2 - Symptomatic, <50% confined to bed  Vitals:   10/16/15 1545 10/16/15 1728  BP: (!) 107/54 120/60  Pulse: (!) 59 86  Resp: 18 18  Temp: 97.9 F (36.6 C) 98.5 F (36.9 C)   Filed Weights   10/14/15 2310   Weight: 187 lb 12.8 oz (85.2 kg)    GENERAL:alert, no distress and comfortable. He looks pale SKIN: skin color is pale, texture, turgor are normal, no rashes or significant lesions EYES: normal, conjunctiva are pale and non-injected, sclera clear OROPHARYNX:no exudate, no erythema and lips, buccal mucosa, and tongue normal  NECK: supple, thyroid  normal size, non-tender, without nodularity LYMPH:  no palpable lymphadenopathy in the cervical, axillary or inguinal LUNGS: clear to auscultation and percussion with normal breathing effort HEART: regular rate & rhythm with loud systolic murmur and trace bilateral lower extremity edema ABDOMEN:abdomen soft, non-tender and normal bowel sounds Musculoskeletal:no cyanosis of digits and no clubbing  PSYCH: alert & oriented x 3 with fluent speech NEURO: no focal motor/sensory deficits  LABORATORY DATA:  I have reviewed the data as listed Lab Results  Component Value Date   WBC 3.1 (L) 10/16/2015   HGB 7.4 (L) 10/16/2015   HCT 23.3 (L) 10/16/2015   MCV 86.3 10/16/2015   PLT 149 (L) 10/16/2015    Recent Labs  02/12/15 0829  03/17/15 1901 03/18/15 0532 07/16/15 0310 09/24/15 1450 09/24/15 1607 10/14/15 1927 10/15/15 0429 10/15/15 1056  NA  --   < >  --  136 131* 133* 136 132* 134*  --   K  --   < >  --  3.8 3.8 4.1 4.0 3.8 3.6  --   CL  --   < >  --  100* 99* 102 97* 101 104  --   CO2  --   < >  --  28 27 24   --  26 26  --   GLUCOSE  --   < >  --  121* 165* 252* 235* 239* 217*  --   BUN  --   < >  --  7 10 27* 32* 22* 18  --   CREATININE  --   < >  --  0.65 0.52* 0.71 0.60* 0.59* 0.50*  --   CALCIUM  --   < >  --  10.2 10.1 9.9  --  9.8 9.1  --   GFRNONAA  --   < >  --  >60 >60 >60  --  >60 >60  --   GFRAA  --   < >  --  >60 >60 >60  --  >60 >60  --   PROT 7.9  --  8.1 7.5 7.8  --   --   --   --  5.3*  ALBUMIN 3.6  --  3.4* 3.1* 3.2*  --   --   --   --  2.6*  AST 28  --  32 35 24  --   --   --   --  29  ALT 10  --  14* 6* <5*  --    --   --   --  19  ALKPHOS 75  --  65 61 96  --   --   --   --  80  BILITOT 0.4  --  1.6* 0.8 0.6  --   --   --   --  1.2  BILIDIR 0.2  --  0.3  --   --   --   --   --   --  0.2  IBILI 0.2  --  1.3*  --   --   --   --   --   --  1.0*  < > = values in this interval not displayed.  RADIOGRAPHIC STUDIES: I have personally reviewed the radiological images as listed and agreed with the findings in the report. Ct Head Wo Contrast  Result Date: 09/24/2015 CLINICAL DATA:  Syncope. EXAM: CT HEAD WITHOUT CONTRAST CT CERVICAL SPINE WITHOUT CONTRAST TECHNIQUE: Multidetector CT imaging of the head and cervical spine was performed following the  standard protocol without intravenous contrast. Multiplanar CT image reconstructions of the cervical spine were also generated. COMPARISON:  None. FINDINGS: CT HEAD FINDINGS There is mild generalized age related parenchymal atrophy with commensurate dilatation of the sulci. No hydrocephalus. There is no mass, hemorrhage, edema or other evidence of acute parenchymal abnormality. No extra-axial hemorrhage. There are chronic calcified atherosclerotic changes of the large vessels at the skull base. No osseous fracture or dislocation seen. No evidence of acute sinusitis. Mastoid air cells are clear. CT CERVICAL SPINE FINDINGS Degenerative changes are seen throughout the cervical spine, mild to moderate in degree, with associated disc space narrowings and osseous spurring. Associated disc-osteophytic bulges are seen at the C3-4 through C7-T1 levels causing mild central canal stenoses. Additional degenerative hypertrophy of the uncovertebral and facet joints are causing moderate to severe neural foramen stenoses at the C3-4, C4-5 and C6-7 levels with possible associated nerve root impingements. Slight reversal of the normal cervical spine lordosis is likely related to these underlying degenerative changes. No fracture line or displaced fracture fragment identified. Facet joints appear  normally aligned throughout. Paravertebral soft tissues are unremarkable. IMPRESSION: 1. No acute intracranial abnormality. No intracranial mass, hemorrhage or edema. No skull fracture. 2. No fracture or acute subluxation identified in the cervical spine. Degenerative changes of the cervical spine, as detailed above. Electronically Signed   By: Franki Cabot M.D.   On: 09/24/2015 17:10   Ct Cervical Spine Wo Contrast  Result Date: 09/24/2015 CLINICAL DATA:  Syncope. EXAM: CT HEAD WITHOUT CONTRAST CT CERVICAL SPINE WITHOUT CONTRAST TECHNIQUE: Multidetector CT imaging of the head and cervical spine was performed following the standard protocol without intravenous contrast. Multiplanar CT image reconstructions of the cervical spine were also generated. COMPARISON:  None. FINDINGS: CT HEAD FINDINGS There is mild generalized age related parenchymal atrophy with commensurate dilatation of the sulci. No hydrocephalus. There is no mass, hemorrhage, edema or other evidence of acute parenchymal abnormality. No extra-axial hemorrhage. There are chronic calcified atherosclerotic changes of the large vessels at the skull base. No osseous fracture or dislocation seen. No evidence of acute sinusitis. Mastoid air cells are clear. CT CERVICAL SPINE FINDINGS Degenerative changes are seen throughout the cervical spine, mild to moderate in degree, with associated disc space narrowings and osseous spurring. Associated disc-osteophytic bulges are seen at the C3-4 through C7-T1 levels causing mild central canal stenoses. Additional degenerative hypertrophy of the uncovertebral and facet joints are causing moderate to severe neural foramen stenoses at the C3-4, C4-5 and C6-7 levels with possible associated nerve root impingements. Slight reversal of the normal cervical spine lordosis is likely related to these underlying degenerative changes. No fracture line or displaced fracture fragment identified. Facet joints appear normally  aligned throughout. Paravertebral soft tissues are unremarkable. IMPRESSION: 1. No acute intracranial abnormality. No intracranial mass, hemorrhage or edema. No skull fracture. 2. No fracture or acute subluxation identified in the cervical spine. Degenerative changes of the cervical spine, as detailed above. Electronically Signed   By: Franki Cabot M.D.   On: 09/24/2015 17:10   Dg Chest Portable 1 View  Result Date: 09/24/2015 CLINICAL DATA:  Near syncope EXAM: PORTABLE CHEST 1 VIEW COMPARISON:  07/16/2015 FINDINGS: Cardiomediastinal silhouette is stable. Status post median sternotomy. There is chronic elevation of the right hemidiaphragm. No infiltrate or pulmonary edema. IMPRESSION: No active disease. Again noted status post median sternotomy and cardiac valve replacement. Chronic elevation of the right hemidiaphragm. Electronically Signed   By: Lahoma Crocker M.D.   On: 09/24/2015  17:20    ASSESSMENT & PLAN:  Multiple myeloma The patient is currently on clinical trial I spoke with his oncologist at [facility] He has appointment pending there on Monday He will continue chemotherapy as directed  Severe anemia This is multifactorial I suspect there is a component of GI bleed due to severe iron deficiency There is also a component of hemolysis I will order additional workup I recommend we proceed with blood transfusion to keep hemoglobin greater than 8 g and I will give him another dose of IV iron today  Loud heart murmur I'm wondering whether he may have paravalvular leak causing increased hemolysis According to the patient, he had repeat echocardiogram several months ago which were within normal limits He will continue medication per cardiologist  Mild leukopenia Likely due to chemotherapy. Observe only  Protein calorie malnutrition Likely due to active bone marrow disease Recommend nutritional supplement  CODE STATUS Full code  Discharge planning He is not  ready to be discharged due to ongoing issues with unresolved, severe anemia I will return to check on him tomorrow Please do not discharge him as his hemoglobin is stable for at least 24 hours prior to discharge without transfusion  All questions were answered. The patient knows to call the clinic with any problems, questions or concerns.   [facility], [place], MD 10/16/2015 5:51 PM

## 2015-10-16 NOTE — Progress Notes (Signed)
Subjective: Feel somewhat better today, with slightly more energy.  No chest pain or dyspnea.  No signs of bleeding, had one BM this morning which was dark brown.  Objective:  Vital signs in last 24 hours: Vitals:   10/15/15 1611 10/15/15 1756 10/15/15 2113 10/16/15 0700  BP: (!) 101/55 (!) 136/57 (!) 105/59 (!) 121/56  Pulse: 65 66 64 64  Resp: 16 16 (!) 24 (!) 22  Temp: 97.5 F (36.4 C) 97.7 F (36.5 C) 98.4 F (36.9 C) 98.9 F (37.2 C)  TempSrc: Oral Oral Oral Oral  SpO2:   99% 96%  Weight:      Height:       Physical Exam  Constitutional: He is oriented to person, place, and time.  Alert, in no distress, sitting on edge of bed eating breakfast  Eyes:  Mild conjunctival pallor No scleral icterus  Cardiovascular:  Regular rate and rhythm, 3/6 holosystolic murmur  Pulmonary/Chest: Effort normal and breath sounds normal.  Abdominal: Soft. He exhibits no distension. There is no tenderness.  Musculoskeletal:  Thighs are soft and non-tender No flank tenderness or bruising  Neurological: He is alert and oriented to person, place, and time.  Skin:  Pale  Psychiatric: He has a normal mood and affect. His behavior is normal.   CBC Latest Ref Rng & Units 10/16/2015 10/15/2015 10/15/2015  WBC 4.0 - 10.5 K/uL 3.1(L) - 3.3(L)  Hemoglobin 13.0 - 17.0 g/dL 7.4(L) 7.3(L) 5.7(LL)  Hematocrit 39.0 - 52.0 % 23.3(L) 23.7(L) 18.9(L)  Platelets 150 - 400 K/uL 149(L) - 157   Hepatic Function Latest Ref Rng & Units 10/15/2015 07/16/2015 03/18/2015  Total Protein 6.5 - 8.1 g/dL 5.3(L) 7.8 7.5  Albumin 3.5 - 5.0 g/dL 2.6(L) 3.2(L) 3.1(L)  AST 15 - 41 U/L 29 24 35  ALT 17 - 63 U/L 19 <5(L) 6(L)  Alk Phosphatase 38 - 126 U/L 80 96 61  Total Bilirubin 0.3 - 1.2 mg/dL 1.2 0.6 0.8  Bilirubin, Direct 0.1 - 0.5 mg/dL 0.2 - -   Component     Latest Ref Rng & Units 03/17/2015 10/15/2015  LDH     98 - 192 U/L 194 (H) 207 (H)   Component     Latest Ref Rng & Units 03/17/2015 10/15/2015  Haptoglobin     34  - 200 mg/dL <10 (L) <10 (L)   Component     Latest Ref Rng & Units 03/17/2015 10/15/2015  DAT, complement      NEG NEG  DAT, IgG      NEG NEG   Component     Latest Ref Rng & Units 03/17/2015 07/16/2015 09/24/2015  Fecal Occult Blood, POC     NEGATIVE NEGATIVE POSITIVE (A) POSITIVE (A)   Component     Latest Ref Rng & Units 10/14/2015  Fecal Occult Blood, POC     NEGATIVE POSITIVE (A)   Assessment/Plan:  Active Problems:   H/O aortic valve replacement   Syncope   Multiple myeloma not having achieved remission (HCC)   Symptomatic anemia   Hemorrhoids  #Refractory Anemia Hgb initially did not increase as expected with 2U pRBCs (5.0 -> 5.5), then received further 2U RBCs with appropriate increase (5.5 -> 7.4).  No clinical signs of ongoing GI bleeding, no flank bruising or tenderness, no thigh discoloration or tenderness.  Has low haptoglobin, but Coombs negative, bili and LDH near normal, does not suggest intravascular hemolysis can explain blood loss at that rate.  Anisocytosis, heterochromasia, and only rare  schistocyes on my review of his peripheral smear, consistent with his transfusion history.  Dr. Quay Burow spoke with his [place] hematologist, who requested an inpatient heme consult here. -Transfuse additional 1U pRBCs -Post-transfusion CBC -Heme/onc consult with Dr Alvy Bimler  #Multiple Myeloma -Continue home pomalidomide, acyclovir, folic acid -Dexamethasome weekly (Wednesdays)  #DM -[facility]  Dispo: Anticipated discharge in approximately 1 day.   Minus Liberty, MD 10/16/2015, 1:19 PM Pager: [phone]

## 2015-10-17 LAB — CBC
HCT: 30.3 % — ABNORMAL LOW (ref 39.0–52.0)
HEMATOCRIT: 28.2 % — AB (ref 39.0–52.0)
Hemoglobin: 8.9 g/dL — ABNORMAL LOW (ref 13.0–17.0)
Hemoglobin: 9.5 g/dL — ABNORMAL LOW (ref 13.0–17.0)
MCH: 27.6 pg (ref 26.0–34.0)
MCH: 27.8 pg (ref 26.0–34.0)
MCHC: 31.6 g/dL (ref 30.0–36.0)
MCHC: 31.4 g/dL (ref 30.0–36.0)
MCV: 87.6 fL (ref 78.0–100.0)
MCV: 88.6 fL (ref 78.0–100.0)
PLATELETS: 127 10*3/uL — AB (ref 150–400)
PLATELETS: 152 10*3/uL (ref 150–400)
RBC: 3.22 MIL/uL — ABNORMAL LOW (ref 4.22–5.81)
RBC: 3.42 MIL/uL — AB (ref 4.22–5.81)
RDW: 18.2 % — AB (ref 11.5–15.5)
RDW: 19.2 % — AB (ref 11.5–15.5)
WBC: 3.2 10*3/uL — AB (ref 4.0–10.5)
WBC: 3.4 10*3/uL — ABNORMAL LOW (ref 4.0–10.5)

## 2015-10-17 LAB — GLUCOSE, CAPILLARY
GLUCOSE-CAPILLARY: 178 mg/dL — AB (ref 65–99)
Glucose-Capillary: 173 mg/dL — ABNORMAL HIGH (ref 65–99)
Glucose-Capillary: 174 mg/dL — ABNORMAL HIGH (ref 65–99)
Glucose-Capillary: 233 mg/dL — ABNORMAL HIGH (ref 65–99)

## 2015-10-17 LAB — RETICULOCYTES
RBC.: 3.22 MIL/uL — AB (ref 4.22–5.81)
RETIC COUNT ABSOLUTE: 183.5 10*3/uL (ref 19.0–186.0)
Retic Ct Pct: 5.7 % — ABNORMAL HIGH (ref 0.4–3.1)

## 2015-10-17 NOTE — Progress Notes (Signed)
Internal Medicine Attending:   I saw and examined the patient. I reviewed the resident's note and I agree with the resident's findings and plan as documented in the resident's note.  [name] is doing well on rounds today. This admission he has received 6 units of pRBCs and hemoglobin is 8.9 g this morning. [name] is evaluated for more subtle hemolysis from his small aortic peri-valvular leak. We will recheck Hgb this afternoon and transfuse again if it continues to decline. We will watch his hemoglobin trend over the next 24 hours and likely discharge to home tomorrow if stable.

## 2015-10-17 NOTE — Progress Notes (Signed)
[name]   DOB:10/02/1945   ZO#:[id number]    Subjective: He feels fine. Denies chest pain or shortness of breath. The patient denies any recent signs or symptoms of bleeding such as spontaneous epistaxis, hematuria or hematochezia.   Objective:  Vitals:   10/17/15 0317 10/17/15 0619  BP: (!) 123/51 138/63  Pulse: (!) 59 (!) 56  Resp: 16 16  Temp: 98.2 F (36.8 C) 98 F (36.7 C)     Intake/Output Summary (Last 24 hours) at 10/17/15 1703 Last data filed at 10/17/15 0315  Gross per 24 hour  Intake           926.67 ml  Output              150 ml  Net           776.67 ml    GENERAL:alert, no distress and comfortable SKIN: skin color is pale, texture, turgor are normal, no rashes or significant lesions EYES: normal, Conjunctiva are pale and non-injected, sclera clear Musculoskeletal:no cyanosis of digits and no clubbing  NEURO: alert & oriented x 3 with fluent speech, no focal motor/sensory deficits   Labs:  Lab Results  Component Value Date   WBC 3.2 (L) 10/17/2015   HGB 8.9 (L) 10/17/2015   HCT 28.2 (L) 10/17/2015   MCV 87.6 10/17/2015   PLT 127 (L) 10/17/2015   NEUTROABS 6.1 09/24/2015    Lab Results  Component Value Date   NA 134 (L) 10/15/2015   K 3.6 10/15/2015   CL 104 10/15/2015   CO2 26 10/15/2015    Assessment & Plan:   Multiple myeloma The patient is currently on clinical trial I spoke with his oncologist at [facility]. He has appointment pending there on Monday He will continue chemotherapy as directed  Severe anemia This is multifactorial I suspect there is a component of GI bleed due to severe iron deficiency There is also a component of hemolysis I will order additional workup: Serum free hemoglobin, hemosiderin, erythropoietin levels were all pending. Reticulocytosis is present but inappropriately low given the degree of anemia I recommend we proceed with blood transfusion to keep hemoglobin greater than 8 g He  received one dose of intravenous iron on 10/16/2015  Loud heart murmur I'm wondering whether he may have paravalvular leak causing increased hemolysis According to the patient, he had repeat echocardiogram several months ago which were within normal limits He will continue medication per cardiologist  Mild leukopenia Likely due to chemotherapy. Observe only  Protein calorie malnutrition Likely due to active bone marrow disease Recommend nutritional supplement  CODE STATUS Full code  Discharge planning He is not ready to be discharged due to ongoing issues with unresolved, severe anemia If repeat hemoglobin tomorrow remain stable, greater than 8 g, he can be discharged. I will fax results of his workup to his oncologist at [facility] next week   [facility], [place], MD 10/17/2015  5:03 PM

## 2015-10-17 NOTE — Progress Notes (Signed)
   Subjective: Patient was seen and examined this morning. Patient denies any lightheadedness or weakness. He has not had a recurrent bowel movement since yesterday.   Objective: Vital signs in last 24 hours: Vitals:   10/17/15 0040 10/17/15 0110 10/17/15 0317 10/17/15 0619  BP: (!) 138/59 127/66 (!) 123/51 138/63  Pulse: (!) 58 (!) 56 (!) 59 (!) 56  Resp: '18 18 16 16  '$ Temp: 97.7 F (36.5 C) 98.1 F (36.7 C) 98.2 F (36.8 C) 98 F (36.7 C)  TempSrc: Oral Oral Oral Oral  SpO2: 98% 99% 96% 98%  Weight:      Height:       Physical Exam General: Vital signs reviewed.  Patient is chronically ill appearing, in no acute distress and cooperative with exam.  Cardiovascular: RRR, 3/6 systolic murmur with click Pulmonary/Chest: Clear to auscultation bilaterally, no wheezes, rales, or rhonchi. 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 AIHA has been essentially rule out as patient has a low haptoglobin, DAT negative and bili and LDH near normal.  Anisocytosis, heterochromasia, and only rare schistocyes on my review of his peripheral smear, consistent with his transfusion history. He has had a recent EGD and colonoscopy with obvious source of bleeding. Plans are in place for an outpatient capsule study. I spoke with his [place] hematologist, Dr. Jiles Crocker, who requested an inpatient heme consult here. [name] kindly saw our patient and suspected his anemia is multifactorial with a component of GIB or due to paravalvular leak. She recommended Hgb transfusion goal of >8 and IV iron. Urine hemosiderin, retic count, plasma free hemoglobin and EPO levels were ordered. Hemoglobin this morning was 8.9 from 7.9 after 2 units pRBCs yesterday.  -Repeat afternoon  CBC -Transfusion goal of 8 -Urine hemosiderin, retic count, plasma free hemoglobin and EPO levels pending -Hematology following, appreciate recommendations   Multiple Myeloma: Patient follows with Dr. Jiles Crocker at the [facility] in [place] and with [place] and is undergoing a clinical trial.  -Continue home pomalidomide, acyclovir, folic acid -Dexamethasome weekly (Wednesdays) -Follow up with Oncology on 10/19/15  Dispo: Anticipated discharge in approximately 2 day(s).   LOS: 2 days   Martyn Malay, DO PGY-3 Internal Medicine Resident Pager # [phone] 10/17/2015 1:22 PM

## 2015-10-18 LAB — CBC
HEMATOCRIT: 31.8 % — AB (ref 39.0–52.0)
HEMOGLOBIN: 9.7 g/dL — AB (ref 13.0–17.0)
MCH: 27.4 pg (ref 26.0–34.0)
MCHC: 30.5 g/dL (ref 30.0–36.0)
MCV: 89.8 fL (ref 78.0–100.0)
Platelets: 165 10*3/uL (ref 150–400)
RBC: 3.54 MIL/uL — ABNORMAL LOW (ref 4.22–5.81)
RDW: 19.8 % — AB (ref 11.5–15.5)
WBC: 3.2 10*3/uL — AB (ref 4.0–10.5)

## 2015-10-18 LAB — TYPE AND SCREEN
ABO/RH(D): A NEG
Antibody Screen: NEGATIVE
UNIT DIVISION: 0
UNIT DIVISION: 0
UNIT DIVISION: 0
Unit division: 0
Unit division: 0
Unit division: 0

## 2015-10-18 LAB — GLUCOSE, CAPILLARY: Glucose-Capillary: 167 mg/dL — ABNORMAL HIGH (ref 65–99)

## 2015-10-18 NOTE — Progress Notes (Signed)
   Subjective: No acute events.  He denies chest pain, dyspnea or dizziness.  No signs of bleeding, had two brown BMs yesterday.  Objective:  Vital signs in last 24 hours: Vitals:   10/17/15 0317 10/17/15 0619 10/17/15 2104 10/18/15 0556  BP: (!) 123/51 138/63 127/81 (!) 110/59  Pulse: (!) 59 (!) 56 (!) 56 (!) 54  Resp: '16 16 20 16  '$ Temp: 98.2 F (36.8 C) 98 F (36.7 C) 98.3 F (36.8 C) 98.6 F (37 C)  TempSrc: Oral Oral Oral Oral  SpO2: 96% 98% 99% 98%  Weight:      Height:       Physical Exam  Constitutional: He is oriented to person, place, and time.  Alert, in no distress, standing up by his sink  Eyes:  Mild conjunctival pallor No scleral icterus  Cardiovascular:  Regular rate and rhythm, 3/6 holosystolic murmur  Pulmonary/Chest: Effort normal and breath sounds normal.  Abdominal: Soft. He exhibits no distension. There is no tenderness.  Musculoskeletal:  Thighs are soft and non-tender No flank tenderness or bruising  Neurological: He is alert and oriented to person, place, and time.  Skin:  Slightly pale  Psychiatric: He has a normal mood and affect. His behavior is normal.   CBC Latest Ref Rng & Units 10/18/2015 10/17/2015 10/17/2015  WBC 4.0 - 10.5 K/uL 3.2(L) 3.4(L) 3.2(L)  Hemoglobin 13.0 - 17.0 g/dL 9.7(L) 9.5(L) 8.9(L)  Hematocrit 39.0 - 52.0 % 31.8(L) 30.3(L) 28.2(L)  Platelets 150 - 400 K/uL 165 152 127(L)   Hepatic Function Latest Ref Rng & Units 10/15/2015 07/16/2015 03/18/2015  Total Protein 6.5 - 8.1 g/dL 5.3(L) 7.8 7.5  Albumin 3.5 - 5.0 g/dL 2.6(L) 3.2(L) 3.1(L)  AST 15 - 41 U/L 29 24 35  ALT 17 - 63 U/L 19 <5(L) 6(L)  Alk Phosphatase 38 - 126 U/L 80 96 61  Total Bilirubin 0.3 - 1.2 mg/dL 1.2 0.6 0.8  Bilirubin, Direct 0.1 - 0.5 mg/dL 0.2 - -    Assessment/Plan:  Principal Problem:   Symptomatic anemia Active Problems:   H/O aortic valve replacement   Syncope   Multiple myeloma not having achieved remission (HCC)   Hemorrhoids  #Refractory  Anemia Admitted with Hgb of 5.0 which initially did not increase with 2 U pRBCs.  Has now received total 6U pRBCs this admission (last 9/9) with stable Hgb. No clinical signs of ongoing GI bleeding, no flank bruising or tenderness, no thigh discoloration or tenderness.  Hemolysis workup with low haptoglobin, but Coombs negative, bili and LDH near normal, does not suggest intravascular hemolysis can explain blood loss at that rate.  Anisocytosis, heterochromasia, and only rare schistocyes on my review of his peripheral smear, consistent with his transfusion history.  Has had recent EGD and colonoscopy through the [place].  Dr Quay Burow discussed patient with [place] hematologist Dr Jiles Crocker, who requested hematology evalution at [facility]. -Goal Hgb >8.0 -Follow-up AM Hgb -Plasma free Hgb, EPO, urine hemosiderin pending -Discharge if Hgb stable and at goal -Has close follow-up scheduled, with [facility] appointment with oncologist Dr Tiana Loft tomorrow and [place] lab draw on Tuesday  #Multiple Myeloma Patient follows with Dr. Jiles Crocker at the [facility] in [place] and with [facility] and is undergoing a clinical trial led by Dr Sharren Bridge. -Continue home pomalidomide, acyclovir, folic acid -Dexamethasome weekly (Wednesdays) -Follow-up with Dr Tiana Loft on 9/11  #DM -[facility]  Dispo: Anticipated discharge today. Minus Liberty, MD 10/18/2015, 9:23 AM Pager: [phone]

## 2015-10-18 NOTE — Discharge Instructions (Signed)
You were admitted to the hospital for anemia (low red blood cells), which can cause you to feel weak and pass out.  We transfused you with a total of 6 units of blood, and made sure your blood counts did not continue to drop.  We found some evidence of hemolysis (destruction of blood cells), but it is not clear if this is enough to explain why your blood counts keep dropping.  Please keep your follow-up appointments at [facility] tomorrow (Monday 9/11) and the [facility] on Tuesday (9/12) as scheduled.  It is important to have your blood checked to make sure your blood counts are staying up.   Dr Alvy Bimler, the hematologist you saw here, will call you next week to follow-up with pending test results.   If you feel short of breath, have chest pain, or become more tired or lightheaded, please return to the ED.  If you have any bleeding, including coughing up blood, vomiting blood, having blood with bowel movements, or having dark, black, sticky bowel movements, come to the ED.    Anemia Anemia is a condition in which the concentration of red blood cells or hemoglobin in the blood is below normal. Hemoglobin is a substance in red blood cells that carries oxygen to the tissues of the body. Anemia results in not enough oxygen reaching these tissues.  CAUSES  Common causes of anemia include:   Excessive bleeding. Bleeding may be internal or external. This includes excessive bleeding from periods (in women) or from the intestine.   Poor nutrition.   Chronic kidney, thyroid, and liver disease.  Bone marrow disorders that decrease red blood cell production.  Cancer and treatments for cancer.  HIV, AIDS, and their treatments.  Spleen problems that increase red blood cell destruction.  Blood disorders.  Excess destruction of red blood cells due to infection, medicines, and autoimmune disorders. SIGNS AND SYMPTOMS   Minor weakness.   Dizziness.   Headache.  Palpitations.    Shortness of breath, especially with exercise.   Paleness.  Cold sensitivity.  Indigestion.  Nausea.  Difficulty sleeping.  Difficulty concentrating. Symptoms may occur suddenly or they may develop slowly.  DIAGNOSIS  Additional blood tests are often needed. These help your health care provider determine the best treatment. Your health care provider will check your stool for blood and look for other causes of blood loss.  TREATMENT  Treatment varies depending on the cause of the anemia. Treatment can include:   Supplements of iron, vitamin 123456, or folic acid.   Hormone medicines.   A blood transfusion. This may be needed if blood loss is severe.   Hospitalization. This may be needed if there is significant continual blood loss.   Dietary changes.  Spleen removal. HOME CARE INSTRUCTIONS Keep all follow-up appointments. It often takes many weeks to correct anemia, and having your health care provider check on your condition and your response to treatment is very important. SEEK IMMEDIATE MEDICAL CARE IF:   You develop extreme weakness, shortness of breath, or chest pain.   You become dizzy or have trouble concentrating.  You develop heavy vaginal bleeding.   You develop a rash.   You have bloody or black, tarry stools.   You faint.   You vomit up blood.   You vomit repeatedly.   You have abdominal pain.  You have a fever or persistent symptoms for more than 2-3 days.   You have a fever and your symptoms suddenly get worse.  You are dehydrated.  MAKE SURE YOU:  Understand these instructions.  Will watch your condition.  Will get help right away if you are not doing well or get worse.   This information is not intended to replace advice given to you by your health care provider. Make sure you discuss any questions you have with your health care provider.   Document Released: 03/03/2004 Document Revised: 09/26/2012 Document Reviewed:  07/20/2012 Elsevier Interactive Patient Education [employer].

## 2015-10-18 NOTE — Progress Notes (Signed)
Nsg Discharge Note  Admit Date:  10/14/2015 Discharge date: 10/18/2015   Earney Mallet to be D/C'd Home per MD order.  AVS completed.  Copy for chart, and copy for patient signed, and dated. Patient/caregiver able to verbalize understanding.  Discharge Medication:   Medication List    TAKE these medications   acyclovir 800 MG tablet Commonly known as:  ZOVIRAX Take 0.5 tablets by mouth 2 (two) times daily.   aspirin EC 81 MG tablet Take 81 mg by mouth daily.   carbidopa-levodopa 25-100 MG tablet Commonly known as:  SINEMET IR Take 3 tablets by mouth 4 (four) times daily.   cetirizine 10 MG chewable tablet Commonly known as:  ZYRTEC Chew 10 mg by mouth 2 (two) times daily as needed for other. At the onset of tongue swelling/angioedema   cyclobenzaprine 10 MG tablet Commonly known as:  FLEXERIL Take 5 mg by mouth 3 (three) times daily as needed for muscle spasms.   dexamethasone 4 MG tablet Commonly known as:  DECADRON Take 40 mg by mouth once a week. Take ten tablets (40mg ) every Wednesday with pomalidomide.   diazepam 2 MG tablet Commonly known as:  VALIUM Take 2 mg by mouth at bedtime as needed for other. Restless leg   docusate sodium 100 MG capsule Commonly known as:  COLACE Take 100 mg by mouth 2 (two) times daily as needed for moderate constipation.   ferrous sulfate 325 (65 FE) MG tablet Take 1 tablet (325 mg total) by mouth at bedtime. Reported on 07/16/2015   FIBERCON 625 MG tablet Generic drug:  polycarbophil Take 2 tablets by mouth daily.   finasteride 5 MG tablet Commonly known as:  PROSCAR Take 5 mg by mouth daily.   fish oil-omega-3 fatty acids 1000 MG capsule Take 1 g by mouth daily.   folic acid 1 MG tablet Commonly known as:  FOLVITE Take 1 mg by mouth daily.   gabapentin 800 MG tablet Commonly known as:  NEURONTIN Take 800 mg by mouth 3 (three) times daily.   iron sucrose 20 MG/ML injection Commonly known as:  VENOFER Inject 200 mg  into the vein once a week.   ketoconazole 2 % cream Commonly known as:  NIZORAL Apply 1 application topically daily as needed for irritation.   metFORMIN 500 MG tablet Commonly known as:  GLUCOPHAGE Take 500 mg by mouth 2 (two) times daily with a meal.   methylcellulose 1 % ophthalmic solution Commonly known as:  ARTIFICIAL TEARS Place 1 drop into both eyes at bedtime as needed (dry eyes).   pantoprazole 40 MG tablet Commonly known as:  PROTONIX Take 1 tablet (40 mg total) by mouth daily. For GERD   pomalidomide 4 MG capsule Commonly known as:  POMALYST Take 4 mg by mouth daily. Take with water on days 1-21. PATIENT IS OFF MED FOR 7 DAYS, Repeat every 28 days.   pravastatin 40 MG tablet Commonly known as:  PRAVACHOL Take 1 tablet (40 mg total) by mouth every evening.   simethicone 80 MG chewable tablet Commonly known as:  MYLICON Chew 80 mg by mouth every 6 (six) hours as needed for flatulence.   simvastatin 40 MG tablet Commonly known as:  ZOCOR Take 20 mg by mouth daily.   zinc oxide 20 % ointment Apply 1 application topically 2 (two) times daily as needed for irritation.       Discharge Assessment: Vitals:   10/17/15 2104 10/18/15 0556  BP: 127/81 (!) 110/59  Pulse: Marland Kitchen)  56 (!) 54  Resp: 20 16  Temp: 98.3 F (36.8 C) 98.6 F (37 C)   Skin clean, dry and intact without evidence of skin break down, no evidence of skin tears noted. IV catheter discontinued intact. Site without signs and symptoms of complications - no redness or edema noted at insertion site, patient denies c/o pain - only slight tenderness at site.  Dressing with slight pressure applied.  D/c Instructions-Education: Discharge instructions given to patient/family with verbalized understanding. D/c education completed with patient/family including follow up instructions, medication list, d/c activities limitations if indicated, with other d/c instructions as indicated by MD - patient able to  verbalize understanding, all questions fully answered. Patient instructed to return to ED, call 911, or call MD for any changes in condition.  Patient escorted via [place], and D/C home via private auto.  Salley Slaughter, RN 10/18/2015 10:05 AM

## 2015-10-19 LAB — ERYTHROPOIETIN: ERYTHROPOIETIN: 103.9 m[IU]/mL — AB (ref 2.6–18.5)

## 2015-10-19 LAB — HEMOGLOBIN FREE, PLASMA: HGB PLASMA: 1.5 mg/dL (ref 0.0–4.9)

## 2015-10-21 LAB — HEMOSIDERIN, URINE

## 2015-10-23 ENCOUNTER — Telehealth: Payer: Self-pay | Admitting: *Deleted

## 2015-10-23 ENCOUNTER — Other Ambulatory Visit: Payer: Self-pay | Admitting: *Deleted

## 2015-10-23 DIAGNOSIS — I359 Nonrheumatic aortic valve disorder, unspecified: Secondary | ICD-10-CM

## 2015-10-23 NOTE — Telephone Encounter (Signed)
Per dr Stanford Breed, pt to have TEE for AVR and anemia Tuesday 10/27/15 @ 9am. Discussed procedure and instructions in detail with the patient and he repeated. Orders placed and sent to pre-cert.

## 2015-10-27 ENCOUNTER — Encounter (HOSPITAL_COMMUNITY)
Admission: RE | Disposition: A | Discharge status: Home / Self Care | Payer: Self-pay | Source: Ambulatory Visit | Attending: Cardiology

## 2015-10-27 ENCOUNTER — Encounter (HOSPITAL_COMMUNITY): Payer: Self-pay

## 2015-10-27 ENCOUNTER — Ambulatory Visit (HOSPITAL_COMMUNITY)
Admission: RE | Admit: 2015-10-27 | Discharge: 2015-10-27 | Disposition: A | Discharge status: Home / Self Care | Payer: Medicare Other | Source: Ambulatory Visit | Attending: Cardiology | Admitting: Cardiology

## 2015-10-27 ENCOUNTER — Ambulatory Visit (HOSPITAL_BASED_OUTPATIENT_CLINIC_OR_DEPARTMENT_OTHER): Payer: Medicare Other

## 2015-10-27 DIAGNOSIS — I350 Nonrheumatic aortic (valve) stenosis: Secondary | ICD-10-CM | POA: Insufficient documentation

## 2015-10-27 DIAGNOSIS — N400 Enlarged prostate without lower urinary tract symptoms: Secondary | ICD-10-CM | POA: Diagnosis not present

## 2015-10-27 DIAGNOSIS — E119 Type 2 diabetes mellitus without complications: Secondary | ICD-10-CM | POA: Diagnosis not present

## 2015-10-27 DIAGNOSIS — G4733 Obstructive sleep apnea (adult) (pediatric): Secondary | ICD-10-CM | POA: Insufficient documentation

## 2015-10-27 DIAGNOSIS — C9000 Multiple myeloma not having achieved remission: Secondary | ICD-10-CM | POA: Diagnosis not present

## 2015-10-27 DIAGNOSIS — E785 Hyperlipidemia, unspecified: Secondary | ICD-10-CM | POA: Diagnosis not present

## 2015-10-27 DIAGNOSIS — E0590 Thyrotoxicosis, unspecified without thyrotoxic crisis or storm: Secondary | ICD-10-CM | POA: Insufficient documentation

## 2015-10-27 DIAGNOSIS — T8203XA Leakage of heart valve prosthesis, initial encounter: Secondary | ICD-10-CM

## 2015-10-27 DIAGNOSIS — Z7984 Long term (current) use of oral hypoglycemic drugs: Secondary | ICD-10-CM | POA: Insufficient documentation

## 2015-10-27 DIAGNOSIS — Z952 Presence of prosthetic heart valve: Secondary | ICD-10-CM | POA: Insufficient documentation

## 2015-10-27 DIAGNOSIS — I10 Essential (primary) hypertension: Secondary | ICD-10-CM | POA: Diagnosis not present

## 2015-10-27 DIAGNOSIS — Z9221 Personal history of antineoplastic chemotherapy: Secondary | ICD-10-CM | POA: Diagnosis not present

## 2015-10-27 DIAGNOSIS — D6481 Anemia due to antineoplastic chemotherapy: Secondary | ICD-10-CM | POA: Insufficient documentation

## 2015-10-27 DIAGNOSIS — I359 Nonrheumatic aortic valve disorder, unspecified: Secondary | ICD-10-CM

## 2015-10-27 DIAGNOSIS — Z953 Presence of xenogenic heart valve: Secondary | ICD-10-CM | POA: Diagnosis not present

## 2015-10-27 DIAGNOSIS — Z87891 Personal history of nicotine dependence: Secondary | ICD-10-CM | POA: Insufficient documentation

## 2015-10-27 HISTORY — PX: TEE WITHOUT CARDIOVERSION: SHX5443

## 2015-10-27 HISTORY — DX: Leakage of heart valve prosthesis, initial encounter: T82.03XA

## 2015-10-27 LAB — GLUCOSE, CAPILLARY: Glucose-Capillary: 137 mg/dL — ABNORMAL HIGH (ref 65–99)

## 2015-10-27 SURGERY — ECHOCARDIOGRAM, TRANSESOPHAGEAL
Anesthesia: Moderate Sedation

## 2015-10-27 MED ORDER — SODIUM CHLORIDE 0.9 % IV SOLN
INTRAVENOUS | Status: DC
  Administered 2015-10-27: 500 mL via INTRAVENOUS

## 2015-10-27 MED ORDER — MIDAZOLAM HCL 5 MG/ML IJ SOLN
INTRAMUSCULAR | Status: AC
  Filled 2015-10-27: qty 2

## 2015-10-27 MED ORDER — BUTAMBEN-TETRACAINE-BENZOCAINE 2-2-14 % EX AERO
INHALATION_SPRAY | CUTANEOUS | Status: DC | PRN
  Administered 2015-10-27: 2 via TOPICAL

## 2015-10-27 MED ORDER — FENTANYL CITRATE (PF) 100 MCG/2ML IJ SOLN
INTRAMUSCULAR | Status: AC
  Filled 2015-10-27: qty 2

## 2015-10-27 MED ORDER — FENTANYL CITRATE (PF) 100 MCG/2ML IJ SOLN
INTRAMUSCULAR | Status: DC | PRN
  Administered 2015-10-27 (×2): 25 ug via INTRAVENOUS

## 2015-10-27 MED ORDER — MIDAZOLAM HCL 10 MG/2ML IJ SOLN
INTRAMUSCULAR | Status: DC | PRN
  Administered 2015-10-27: 1 mg via INTRAVENOUS
  Administered 2015-10-27 (×2): 2 mg via INTRAVENOUS

## 2015-10-27 MED ORDER — DIPHENHYDRAMINE HCL 50 MG/ML IJ SOLN
INTRAMUSCULAR | Status: AC
  Filled 2015-10-27: qty 1

## 2015-10-27 NOTE — H&P (Signed)
[name]  10/08/2015 11:45 AM  Office Visit  MRN:  [id number]  Description: Male DOB: September 15, 1945 Provider: Lelon Perla, MD Department: [facility]  Vitals   BP    156/70   Pulse  60   Ht  5' 9"  (1.753 m)   Wt  195 lb (88.5 kg)   BMI  28.80 kg/m      Vitals History  Progress Notes   Lelon Perla, MD at 10/08/2015 11:45 AM   Status: Signed         HPI: FU AVR. Previous AVR in 2006 with a porcine valve. Echocardiogram 11/29/11: Moderate LVH, EF 61-44%, grade 1 diastolic dysfunction, critical aortic stenosis with a mean gradient of 80 mmHg. LHC 11/28/11: Normal coronary arteries, severe aortic stenosis. Patient underwent redo aortic valve replacement with a pericardial tissue valve 12/06/11 with Dr. Roxy Manns. Echocardiogram December 2016 showed normal LV function, grade 2 diastolic dysfunction, bioprosthetic aortic valve with moderate aortic insufficiency, severe left atrial enlargement, moderate right ventricular enlargement. Abdominal ultrasound January 2017 showed no aneurysm. Patient has had problems with GI bleeds. He has multiple myeloma on Velcade. He was recently discharged following admission for orthostatic hypotension and chronic lower GI bleeding. Since he was last seen, He has had chemotherapy for his multiple myeloma and severe anemia requiring transfusion. He has 2 episodes of dizziness and near syncope. The most recent lasted approximately 2 hours and he was seen at [facility] and required 5 units of blood by his report. His anemia has worsened since he started his chemotherapy. He denies dyspnea, chest pain, frank syncope, pedal edema.        Current Outpatient Prescriptions  Medication Sig Dispense Refill  . acyclovir (ZOVIRAX) 800 MG tablet Take 0.5 tablets by mouth 2 (two) times daily.    . carbidopa-levodopa (SINEMET IR) 25-100 MG tablet Take 3 tablets by mouth 4 (four) times daily.     . carboxymethylcellulose 1 % ophthalmic solution  Apply 1 drop to eye 3 (three) times daily as needed (dry eyes).    . cetirizine (ZYRTEC) 10 MG chewable tablet Chew 10 mg by mouth 2 (two) times daily as needed for other. At the onset of tongue swelling/angioedema    . cyclobenzaprine (FLEXERIL) 10 MG tablet Take 5 mg by mouth 3 (three) times daily as needed for muscle spasms.    Marland Kitchen dexamethasone (DECADRON) 4 MG tablet Take 40 mg by mouth once a week. Take ten tablets (69m) every Wednesday with pomalidomide.    . diazepam (VALIUM) 2 MG tablet Take 2 mg by mouth at bedtime as needed for other. Restless leg    . docusate sodium (COLACE) 100 MG capsule Take 100 mg by mouth 2 (two) times daily.    . ferrous sulfate 325 (65 FE) MG tablet Take 1 tablet (325 mg total) by mouth at bedtime. Reported on 07/16/2015 30 tablet 3  . finasteride (PROSCAR) 5 MG tablet Take 5 mg by mouth daily.    . fish oil-omega-3 fatty acids 1000 MG capsule Take 1 g by mouth daily.     . folic acid (FOLVITE) 1 MG tablet Take 1 mg by mouth daily.    .Marland Kitchengabapentin (NEURONTIN) 800 MG tablet Take 800 mg by mouth 3 (three) times daily.    . iron sucrose (VENOFER) 20 MG/ML injection Inject 200 mg into the vein once a week.    .Marland Kitchenketoconazole (NIZORAL) 2 % cream Apply 1 application topically daily as needed for irritation.    .Marland Kitchen  metFORMIN (GLUCOPHAGE) 500 MG tablet Take 500 mg by mouth 2 (two) times daily with a meal.    . methylcellulose (ARTIFICIAL TEARS) 1 % ophthalmic solution Place 1 drop into both eyes at bedtime as needed (dry eyes).    . pantoprazole (PROTONIX) 40 MG tablet Take 1 tablet (40 mg total) by mouth daily. For GERD 30 tablet 3  . polycarbophil (FIBERCON) 625 MG tablet Take 2 tablets by mouth daily.    . pomalidomide (POMALYST) 4 MG capsule Take 4 mg by mouth daily. Take with water on days 1-21. Repeat every 28 days.    . pravastatin (PRAVACHOL) 40 MG tablet Take 1 tablet (40 mg total) by mouth every evening. 30 tablet 11  . simethicone  (MYLICON) 80 MG chewable tablet Chew 80 mg by mouth every 6 (six) hours as needed for flatulence.    . simvastatin (ZOCOR) 40 MG tablet Take 20 mg by mouth daily.    Marland Kitchen zinc oxide 20 % ointment Apply 1 application topically 2 (two) times daily as needed for irritation.     No current facility-administered medications for this visit.          Past Medical History:  Diagnosis Date  . Allergic rhinitis   . Anemia 2009  . Aortic stenosis    a. s/p porcine AVR 2006;  b. s/p redo tissue AVR 12/2011 (Dr. Roxy Manns) - preAVR LHC with no CAD  . Asthma   . BPH (benign prostatic hypertrophy)   . Cancer ([place])    multiple myeloma  . Cataract   . Chronic cough   . Chronic interstitial cystitis   . Depression    pt denies  . Diabetes mellitus    type 2  . Diabetes mellitus without complication ([place])   . Diverticulosis of colon    on colonoscopy 2008  . GERD (gastroesophageal reflux disease)    bravo pH study 2008  . Gout   . H/O aortic valve replacement with porcine valve    2006, 2013  . Headache(784.0)   . Hemorrhoids    external and internal  . Hiatal hernia   . HTN (hypertension)   . Hx of echocardiogram    a. Echo  (post AVR) 12/2011:  mod LVH, EF 60-65%, Gr 2 diast dysfn, mild AI, AVR ok (mean gradient 19 mmHg), MAC, mild BAE  . Hyperlipidemia   . Hypertension   . Hyperthyroidism   . IBS (irritable bowel syndrome)   . Insomnia   . Lower GI bleed 07/16/2015  . OA (osteoarthritis)   . OSA (obstructive sleep apnea)    USES CPAP AS NEEDED  . Periodontitis    chronic with bone loss  . Restless leg syndrome   . S/P aortic valve replacement with bioprosthetic valve 12/06/2011   Redo AVR using 23 mm [facility] Ease pericardial tissue valve         Past Surgical History:  Procedure Laterality Date  . AORTA - FEMORAL ARTERY BYPASS GRAFT    . AORTIC VALVE REPLACEMENT  10/13/2004   89m Edwards Perimount pericardial tissue  valve  . AORTIC VALVE REPLACEMENT  12/06/2011   Procedure: REDO AORTIC VALVE REPLACEMENT (AVR);  Surgeon: CRexene Alberts MD;  [place]  Service: Open Heart Surgery;  Laterality: N/A;  . BUNIONECTOMY     right  . CARDIAC SURGERY     aorta vavle replacement  . CATARACT EXTRACTION  2009    &   2012   BILATERAL  . COLONOSCOPY  08/31/2011   Procedure: COLONOSCOPY;  Surgeon: Inda Castle, MD;  Location: [place];  Service: Endoscopy;  Laterality: N/A;  . ESOPHAGOGASTRODUODENOSCOPY  08/30/2011   Procedure: ESOPHAGOGASTRODUODENOSCOPY (EGD);  Surgeon: Inda Castle, MD;  Location: [place];  Service: Endoscopy;  Laterality: N/A;  Rm 3005  . HERNIA REPAIR    . JOINT REPLACEMENT     knee  . LEFT HEART CATHETERIZATION WITH CORONARY ANGIOGRAM N/A 11/30/2011   Procedure: LEFT HEART CATHETERIZATION WITH CORONARY ANGIOGRAM;  Surgeon: Burnell Blanks, MD;  Location: [facility] CATH LAB;  Service: Cardiovascular;  Laterality: N/A;  . NASAL SEPTOPLASTY W/ TURBINOPLASTY    . REFRACTIVE SURGERY     bilateral  . RIGHT HEART CATHETERIZATION Bilateral 11/30/2011   Procedure: RIGHT HEART CATH;  Surgeon: Burnell Blanks, MD;  Location: [facility] C[facility] LAB;  Service: Cardiovascular;  Laterality: Bilateral;  . right knee arthroscopy      Social History        Social History  . Marital status: Married    Spouse name: N/A  . Number of children: 0  . Years of education: N/A       Occupational History  . retired         Social History Main Topics  . Smoking status: Former Smoker    Quit date: 09/30/1971  . Smokeless tobacco: Never Used  . Alcohol use No  . Drug use: No  . Sexual activity: Not Currently       Other Topics Concern  . Not on file      Social History Narrative   ** Merged History Encounter **             Family History  Problem Relation Age of Onset  . Heart disease Father   . Osteoarthritis Mother   .  Hypertension Sister   . Hyperlipidemia      ROS: no fevers or chills, productive cough, hemoptysis, dysphasia, odynophagia, melena, hematochezia, dysuria, hematuria, rash, seizure activity, orthopnea, PND, pedal edema, claudication. Remaining systems are negative.  Physical Exam: Well-developed well-nourished in no acute distress.  Skin is warm and dry.  HEENT is normal.  Neck is supple.  Chest is clear to auscultation with normal expansion.  Cardiovascular exam is regular rate and rhythm. 3/6 systolic murmur left sternal border. No diastolic murmur. Abdominal exam nontender or distended. No masses palpated. Extremities show no edema. neuro grossly intact  A/P  1 History of aortic valve replacement-continue SBE prophylaxis. Plan repeat echocardiogram December 2017 even moderate aortic insufficiency on most recent echo.  2 hypertension-blood pressure is mildly elevated. However it apparently has been running low. He has required IV fluids and transfusions. I will not add additional medications at this point.  3 hyperlipidemia-continue Pravachol.  4 history of cough syncope  5 multiple myeloma-patient has developed significant anemia related to chemotherapy. Management per oncology.  Kirk Ruths, MD    For TEE to R/O aortic valve disease as cause of hemolytic anemia Kirk Ruths

## 2015-10-27 NOTE — CV Procedure (Signed)
See full TEE report in [employer] Pt sedated with versed 5 mg and fentanyl 50 micrograms IV. Normal LV systolic function S/p AVR; moderate AI; moderate AS (mean gradient 31 mmHg). Full report to follow. Kirk Ruths

## 2015-10-27 NOTE — Interval H&P Note (Signed)
History and Physical Interval Note:  10/27/2015 9:10 AM  [name]  has presented today for surgery, with the diagnosis of AVR,ANEMIA  The various methods of treatment have been discussed with the patient and family. After consideration of risks, benefits and other options for treatment, the patient has consented to  Procedure(s): TRANSESOPHAGEAL ECHOCARDIOGRAM (TEE) (N/A) as a surgical intervention .  The patient's history has been reviewed, patient examined, no change in status, stable for surgery.  I have reviewed the patient's chart and labs.  Questions were answered to the patient's satisfaction.     Kirk Ruths

## 2015-10-27 NOTE — Progress Notes (Signed)
  Echocardiogram 2D Echocardiogram has been performed.  Donata Clay 10/27/2015, 10:24 AM

## 2015-10-27 NOTE — Discharge Instructions (Signed)
YOU HAD AN CARDIAC PROCEDURE TODAY: Refer to the procedure report and other information in the discharge instructions given to you for any specific questions about what was found during the examination. If this information does not answer your questions, please call [facility] office at [phone] to clarify.  ° °DIET: Your first meal following the procedure should be a light meal and then it is ok to progress to your normal diet. A half-sandwich or bowl of soup is an example of a good first meal. Heavy or fried foods are harder to digest and may make you feel nauseous or bloated. Drink plenty of fluids but you should avoid alcoholic beverages for 24 hours. If you had a esophageal dilation, please see attached instructions for diet.  ° °ACTIVITY: Your care partner should take you home directly after the procedure. You should plan to take it easy, moving slowly for the rest of the day. You can resume normal activity the day after the procedure however YOU SHOULD NOT DRIVE, use power tools, machinery or perform tasks that involve climbing or major physical exertion for 24 hours (because of the sedation medicines used during the test).  ° °SYMPTOMS TO REPORT IMMEDIATELY: °A cardiologist can be reached at any hour. Please call [phone] for any of the following symptoms:  °Vomiting of blood or coffee ground material  °New, significant abdominal pain  °New, significant chest pain or pain under the shoulder blades  °Painful or persistently difficult swallowing  °New shortness of breath  °Black, tarry-looking or red, bloody stools ° °FOLLOW UP:  °Please also call with any specific questions about appointments or follow up tests. ° ° ° °Moderate Conscious Sedation, Adult, Care After °Refer to this sheet in the next few weeks. These instructions provide you with information on caring for yourself after your procedure. Your health care provider may also give you more specific instructions. Your treatment has been  planned according to current medical practices, but problems sometimes occur. Call your health care provider if you have any problems or questions after your procedure. °WHAT TO EXPECT AFTER THE PROCEDURE  °After your procedure: °· You may feel sleepy, clumsy, and have poor balance for several hours. °· Vomiting may occur if you eat too soon after the procedure. °HOME CARE INSTRUCTIONS °· Do not participate in any activities where you could become injured for at least 24 hours. Do not: °¨ Drive. °¨ Swim. °¨ Ride a bicycle. °¨ Operate heavy machinery. °¨ Cook. °¨ Use power tools. °¨ Climb ladders. °¨ Work from a high place. °· Do not make important decisions or sign legal documents until you are improved. °· If you vomit, drink water, juice, or soup when you can drink without vomiting. Make sure you have little or no nausea before eating solid foods. °· Only take over-the-counter or prescription medicines for pain, discomfort, or fever as directed by your health care provider. °· Make sure you and your family fully understand everything about the medicines given to you, including what side effects may occur. °· You should not drink alcohol, take sleeping pills, or take medicines that cause drowsiness for at least 24 hours. °· If you smoke, do not smoke without supervision. °· If you are feeling better, you may resume normal activities 24 hours after you were sedated. °· Keep all appointments with your health care provider. °SEEK MEDICAL CARE IF: °· Your skin is pale or bluish in color. °· You continue to feel nauseous or vomit. °· Your pain is getting   worse and is not helped by medicine. °· You have bleeding or swelling. °· You are still sleepy or feeling clumsy after 24 hours. °SEEK IMMEDIATE MEDICAL CARE IF: °· You develop a rash. °· You have difficulty breathing. °· You develop any type of allergic problem. °· You have a fever. °MAKE SURE YOU: °· Understand these instructions. °· Will watch your condition. °· Will  get help right away if you are not doing well or get worse. °  °This information is not intended to replace advice given to you by your health care provider. Make sure you discuss any questions you have with your health care provider. °  °Document Released: 11/14/2012 Document Revised: 02/14/2014 Document Reviewed: 11/14/2012 °Elsevier Interactive Patient Education ©2016 Elsevier Inc. ° °

## 2015-10-28 ENCOUNTER — Encounter: Payer: Self-pay | Admitting: Thoracic Surgery (Cardiothoracic Vascular Surgery)

## 2015-11-04 NOTE — Progress Notes (Signed)
HPI: FU AVR. Previous AVR in 2006 with a porcine valve. Echocardiogram 11/29/11: Moderate LVH, EF 10-31%, grade 1 diastolic dysfunction, critical aortic stenosis with a mean gradient of 80 mmHg. LHC 11/28/11: Normal coronary arteries, severe aortic stenosis. Patient underwent redo aortic valve replacement with a pericardial tissue valve 12/06/11 with Dr. Roxy Manns. Echocardiogram December 2016 showed normal LV function, grade 2 diastolic dysfunction, bioprosthetic aortic valve with moderate aortic insufficiency, severe left atrial enlargement, moderate right ventricular enlargement. Abdominal ultrasound January 2017 showed no aneurysm. Patient has had problems with GI blood loss and is now being treated for multiple myeloma. He has required multiple blood transfusions. Laboratories recently showed erythropoietin level of 104, elevated reticulocyte count, LDH 207, and haptoglobin < 10. Direct antiglobulin test negative. I therefore performed a TEE on 10/27/2015. This revealed normal LV systolic function. There was a bioprosthetic aortic valve with elevated mean gradient of 31 mmHg and moderate AI that was paravalvular. There was mild mitral regurgitation, mild biatrial enlargement and mild right ventricular enlargement. I reviewed the patient with Dr. Roxy Manns. He would be high risk for redo aortic valve replacement giving his comorbidities and this would be his third valve replacement. I therefore discussed the patient with Dr. Albertine Patricia in [place]. We will arrange for his TEE to be sent to [facility] for review for consideration of percutaneous closure of the paravalvular leak. This should help with his hemolytic anemia. Pt seen by Dr Stacie Glaze at [facility] and peripheral smear did not show schistocytes. He denies dyspnea, chest pain, palpitations or syncope. He does have some fatigue.  Current Outpatient Prescriptions  Medication Sig Dispense Refill  . acyclovir (ZOVIRAX) 800 MG tablet Take 0.5 tablets by mouth 2  (two) times daily.    . carbidopa-levodopa (SINEMET IR) 25-100 MG tablet Take 3 tablets by mouth 4 (four) times daily.     . cyclobenzaprine (FLEXERIL) 10 MG tablet Take 5 mg by mouth 3 (three) times daily as needed for muscle spasms.    Marland Kitchen dexamethasone (DECADRON) 4 MG tablet Take 40 mg by mouth once a week. Take ten tablets (51m) every Wednesday with pomalidomide.    . diazepam (VALIUM) 2 MG tablet Take 2 mg by mouth at bedtime as needed for other. Restless leg    . docusate sodium (COLACE) 100 MG capsule Take 100 mg by mouth 2 (two) times daily as needed for moderate constipation.     . finasteride (PROSCAR) 5 MG tablet Take 5 mg by mouth daily.    . fish oil-omega-3 fatty acids 1000 MG capsule Take 1 g by mouth daily.     . folic acid (FOLVITE) 1 MG tablet Take 1 mg by mouth daily.    .Marland Kitchengabapentin (NEURONTIN) 800 MG tablet Take 800 mg by mouth 3 (three) times daily.    . iron sucrose (VENOFER) 20 MG/ML injection Inject 200 mg into the vein once a week.    .Marland Kitchenketoconazole (NIZORAL) 2 % cream Apply 1 application topically daily as needed for irritation.    . metFORMIN (GLUCOPHAGE) 500 MG tablet Take 500 mg by mouth 2 (two) times daily with a meal.    . methylcellulose (ARTIFICIAL TEARS) 1 % ophthalmic solution Place 1 drop into both eyes at bedtime as needed (dry eyes).    . pantoprazole (PROTONIX) 40 MG tablet Take 1 tablet (40 mg total) by mouth daily. For GERD 30 tablet 3  . polycarbophil (FIBERCON) 625 MG tablet Take 2 tablets by mouth daily.    .Marland Kitchen  pomalidomide (POMALYST) 4 MG capsule Take 4 mg by mouth daily. Take with water on days 1-21. PATIENT IS OFF MED FOR 7 DAYS, Repeat every 28 days.    . pravastatin (PRAVACHOL) 40 MG tablet Take 1 tablet (40 mg total) by mouth every evening. 30 tablet 11  . simethicone (MYLICON) 80 MG chewable tablet Chew 80 mg by mouth every 6 (six) hours as needed for flatulence.    . simvastatin (ZOCOR) 40 MG tablet Take 20 mg by mouth daily.    Marland Kitchen zinc oxide 20 %  ointment Apply 1 application topically 2 (two) times daily as needed for irritation.     No current facility-administered medications for this visit.      Past Medical History:  Diagnosis Date  . Allergic rhinitis   . Anemia 2009  . Aortic stenosis    a. s/p porcine AVR 2006;  b. s/p redo tissue AVR 12/2011 (Dr. Roxy Manns) - preAVR LHC with no CAD  . Asthma   . BPH (benign prostatic hypertrophy)   . Cancer ([place])    multiple myeloma  . Cataract   . Chronic cough   . Chronic interstitial cystitis   . Depression    pt denies  . Diabetes mellitus    type 2  . Diabetes mellitus without complication ([place])   . Diverticulosis of colon    on colonoscopy 2008  . GERD (gastroesophageal reflux disease)    bravo pH study 2008  . Gout   . H/O aortic valve replacement with porcine valve    2006, 2013  . Headache(784.0)   . Hemorrhoids    external and internal  . Hiatal hernia   . HTN (hypertension)   . Hx of echocardiogram    a. Echo  (post AVR) 12/2011:  mod LVH, EF 60-65%, Gr 2 diast dysfn, mild AI, AVR ok (mean gradient 19 mmHg), MAC, mild BAE  . Hyperlipidemia   . Hypertension   . Hyperthyroidism   . IBS (irritable bowel syndrome)   . Insomnia   . Lower GI bleed 07/16/2015  . OA (osteoarthritis)   . OSA (obstructive sleep apnea)    USES CPAP AS NEEDED  . 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 Periodontitis    chronic with bone loss  . Restless leg syndrome   . S/P aortic valve replacement with bioprosthetic valve 12/06/2011   Redo AVR using 23 mm [facility] Ease pericardial tissue valve    Past Surgical History:  Procedure Laterality Date  . AORTA - FEMORAL ARTERY BYPASS GRAFT    . AORTIC VALVE REPLACEMENT  10/13/2004   54m Edwards Perimount pericardial tissue valve  . AORTIC VALVE REPLACEMENT  12/06/2011   Procedure: REDO AORTIC VALVE REPLACEMENT (AVR);  Surgeon: CRexene Alberts MD;  [place]  Service: Open Heart Surgery;  Laterality: N/A;    . BUNIONECTOMY     right  . CARDIAC SURGERY     aorta vavle replacement  . CATARACT EXTRACTION  2009    &   2012   BILATERAL  . COLONOSCOPY  08/31/2011   Procedure: COLONOSCOPY;  Surgeon: RInda Castle MD;  [place]  Service: Endoscopy;  Laterality: N/A;  . ESOPHAGOGASTRODUODENOSCOPY  08/30/2011   Procedure: ESOPHAGOGASTRODUODENOSCOPY (EGD);  Surgeon: RInda Castle MD;  [place]  Service: Endoscopy;  Laterality: N/A;  Rm 3005   . HERNIA REPAIR    . JOINT REPLACEMENT     knee  . LEFT HEART CATHETERIZATION  WITH CORONARY ANGIOGRAM N/A 11/30/2011   Procedure: LEFT HEART CATHETERIZATION WITH CORONARY ANGIOGRAM;  Surgeon: Burnell Blanks, MD;  Location[facility] C[facility] LAB;  Service: Cardiovascular;  Laterality: N/A;  . NASAL SEPTOPLASTY W/ TURBINOPLASTY    . REFRACTIVE SURGERY     bilateral  . RIGHT HEART CATHETERIZATION Bilateral 11/30/2011   Procedure: RIGHT HEART CATH;  Surgeon: Burnell Blanks, MD;  Location: [facility] CATH LAB;  Service: Cardiovascular;  Laterality: Bilateral;  . right knee arthroscopy    . TEE WITHOUT CARDIOVERSION N/A 10/27/2015   Procedure: TRANSESOPHAGEAL ECHOCARDIOGRAM (TEE);  Surgeon: Lelon Perla, MD;  Location[facility] ENDOSCOPY;  Service: Cardiovascular;  Laterality: N/A;    Social History   Social History  . Marital status: Married    Spouse name: N/A  . Number of children: 0  . Years of education: N/A   Occupational History  . retired    Social History Main Topics  . Smoking status: Former Smoker    Quit date: 09/30/1971  . Smokeless tobacco: Never Used     Comment: Quit August 973  . Alcohol use No  . Drug use: No  . Sexual activity: Not Currently   Other Topics Concern  . Not on file   Social History Narrative   ** Merged History Encounter **        Family History  Problem Relation Age of Onset  . Heart disease Father   . Osteoarthritis Mother   . Hypertension Sister   . Hyperlipidemia    .  Cancer Maternal Aunt     ROS: no fevers or chills, productive cough, hemoptysis, dysphasia, odynophagia, melena, hematochezia, dysuria, hematuria, rash, seizure activity, orthopnea, PND, pedal edema, claudication. Remaining systems are negative.  Physical Exam: Well-developed well-nourished in no acute distress.  Skin is warm and dry.  HEENT is normal.  Neck is supple.  Chest is clear to auscultation with normal expansion.  Cardiovascular exam is regular rate and rhythm. 2/6 systolic murmur left sternal border. Abdominal exam nontender or distended. No masses palpated. Extremities show no edema. neuro grossly intact  A/P  1 . History of aortic valve replacement-continue SBE prophylaxis. Recent TEE showed moderate paravalvular aortic insufficiency. This certainly could be contributing to his anemia by causing hemolysis. However based on his oncologist most recent evaluation there was no schistocytes on peripheral smear. His haptoglobin was decreased. I will discuss further with his oncologist. If hemolysis is felt to be contributing we will refer to [facility] for possible closure of the recess in his aortic valve replacement. He would be high risk for redo aortic valve replacement. If hemolysis is not felt to be contributing we will perform follow-up echoes in the future.  2 hypertension-blood pressure is mildly elevated but he has had problems with low blood pressure recently. Follow and adjust regimen as needed.  3 hyperlipidemia-continue Pravachol.   4 history of cough syncope.  5 multiple myeloma-management per oncology.  6 anemia-possibly multifactorial including GI blood loss, myeloma and hemolysis.  Kirk Ruths, MD

## 2015-11-06 ENCOUNTER — Encounter: Payer: Self-pay | Admitting: Cardiology

## 2015-11-09 ENCOUNTER — Encounter: Payer: Self-pay | Admitting: Cardiology

## 2015-11-09 ENCOUNTER — Ambulatory Visit (INDEPENDENT_AMBULATORY_CARE_PROVIDER_SITE_OTHER): Payer: Medicare Other | Admitting: Cardiology

## 2015-11-09 VITALS — BP 154/64 | HR 60 | Ht 69.0 in | Wt 192.0 lb

## 2015-11-09 DIAGNOSIS — E782 Mixed hyperlipidemia: Secondary | ICD-10-CM | POA: Diagnosis not present

## 2015-11-09 DIAGNOSIS — I10 Essential (primary) hypertension: Secondary | ICD-10-CM

## 2015-11-09 DIAGNOSIS — I351 Nonrheumatic aortic (valve) insufficiency: Secondary | ICD-10-CM | POA: Diagnosis not present

## 2015-11-09 NOTE — Patient Instructions (Signed)
Your physician recommends that you schedule a follow-up appointment in: [place]

## 2015-11-11 ENCOUNTER — Encounter: Payer: Self-pay | Admitting: Cardiology

## 2015-11-11 NOTE — Progress Notes (Signed)
Discussed pt with Dr Tiana Loft at [facility] (hematology oncology); he feels pts anemia is multifactoral including hemolysis, myeloma and predominantly GI blood loss (based on findings that pt is being transfused, on iron and iron stores remain low; would expect higher iron stores if predominantly a hemolytic process; also no schistocytes on peripheral smear). Will hold off on referral to Dr Albertine Patricia for closure of paravalvular leak for now. Pt completing GI eval with capsule endoscopy. Will consider referral later if hemolysis is thought to be the predominant issue. Discussed with pt.  Kirk Ruths, MD

## 2015-11-23 DIAGNOSIS — R0602 Shortness of breath: Secondary | ICD-10-CM | POA: Diagnosis not present

## 2015-12-09 ENCOUNTER — Encounter: Payer: Self-pay | Admitting: *Deleted

## 2015-12-21 NOTE — Progress Notes (Signed)
HPI:FU AVR. Previous AVR in 2006 with a porcine valve. Echocardiogram 11/29/11: Moderate LVH, EF 29-93%, grade 1 diastolic dysfunction, critical aortic stenosis with a mean gradient of 80 mmHg. LHC 11/28/11: Normal coronary arteries, severe aortic stenosis. Patient underwent redo aortic valve replacement with a pericardial tissue valve 12/06/11 with Dr. Roxy Manns. Echocardiogram December 2016 showed normal LV function, grade 2 diastolic dysfunction, bioprosthetic aortic valve with moderate aortic insufficiency, severe left atrial enlargement, moderate right ventricular enlargement. Abdominal ultrasound January 2017 showed no aneurysm. Patient has had problems with GI blood loss and is now being treated for multiple myeloma. He has required multiple blood transfusions. A TEE on was performed on 10/27/2015 to see if hemolysis from his valve may be contributing. This revealed normal LV systolic function. There was a bioprosthetic aortic valve with elevated mean gradient of 31 mmHg and moderate AI that was paravalvular. There was mild mitral regurgitation, mild biatrial enlargement and mild right ventricular enlargement. I reviewed the patient with Dr. Roxy Manns. He would be high risk for redo aortic valve replacement giving his comorbidities and this would be his third valve replacement. I discussed the patient with Dr. Norma Fredrickson at [facility] who is a member of the hematology oncology department. He felt anemia was multifactorial including hemolysis, myeloma and predominantly GI blood loss. He was being evaluated for GI source and if hemolysis thought to be the major issue in the future we could consider referral to Dr. Albertine Patricia for closure of his paravalvular leak. Since last seen, he is much improved. His hemoglobin is now 10 and he has not required transfusion for 6-8 weeks. He feels iron infusion helped as well. He denies dyspnea, chest pain, palpitations, syncope or edema. His GI evaluation apparently was  negative.  Current Outpatient Prescriptions  Medication Sig Dispense Refill  . acyclovir (ZOVIRAX) 800 MG tablet Take 0.5 tablets by mouth 2 (two) times daily.    . carbidopa-levodopa (SINEMET IR) 25-100 MG tablet Take 3 tablets by mouth 4 (four) times daily.     . cyclobenzaprine (FLEXERIL) 10 MG tablet Take 5 mg by mouth 3 (three) times daily as needed for muscle spasms.    Marland Kitchen dexamethasone (DECADRON) 4 MG tablet Take 40 mg by mouth once a week. Take ten tablets (48m) every Wednesday with pomalidomide.    . diazepam (VALIUM) 2 MG tablet Take 2 mg by mouth at bedtime as needed for other. Restless leg    . docusate sodium (COLACE) 100 MG capsule Take 100 mg by mouth 2 (two) times daily as needed for moderate constipation.     . finasteride (PROSCAR) 5 MG tablet Take 5 mg by mouth daily.    . fish oil-omega-3 fatty acids 1000 MG capsule Take 1 g by mouth daily.     . folic acid (FOLVITE) 1 MG tablet Take 1 mg by mouth daily.    .Marland Kitchengabapentin (NEURONTIN) 800 MG tablet Take 800 mg by mouth 3 (three) times daily.    . iron sucrose (VENOFER) 20 MG/ML injection Inject 200 mg into the vein once a week.    .Marland Kitchenketoconazole (NIZORAL) 2 % cream Apply 1 application topically daily as needed for irritation.    . metFORMIN (GLUCOPHAGE) 500 MG tablet Take 500 mg by mouth 2 (two) times daily with a meal.    . methylcellulose (ARTIFICIAL TEARS) 1 % ophthalmic solution Place 1 drop into both eyes at bedtime as needed (dry eyes).    . pantoprazole (PROTONIX) 40 MG tablet  Take 1 tablet (40 mg total) by mouth daily. For GERD 30 tablet 3  . polycarbophil (FIBERCON) 625 MG tablet Take 2 tablets by mouth daily.    . pomalidomide (POMALYST) 4 MG capsule Take 4 mg by mouth daily. Take with water on days 1-21. PATIENT IS OFF MED FOR 7 DAYS, Repeat every 28 days.    . pravastatin (PRAVACHOL) 40 MG tablet Take 1 tablet (40 mg total) by mouth every evening. 30 tablet 11  . simethicone (MYLICON) 80 MG chewable tablet Chew 80  mg by mouth every 6 (six) hours as needed for flatulence.    . simvastatin (ZOCOR) 40 MG tablet Take 20 mg by mouth daily.    Marland Kitchen zinc oxide 20 % ointment Apply 1 application topically 2 (two) times daily as needed for irritation.     No current facility-administered medications for this visit.      Past Medical History:  Diagnosis Date  . Allergic rhinitis   . Anemia 2009  . Aortic stenosis    a. s/p porcine AVR 2006;  b. s/p redo tissue AVR 12/2011 (Dr. Roxy Manns) - preAVR LHC with no CAD  . Asthma   . BPH (benign prostatic hypertrophy)   . Cancer ([place])    multiple myeloma  . Cataract   . Chronic cough   . Chronic interstitial cystitis   . Depression    pt denies  . Diabetes mellitus    type 2  . Diabetes mellitus without complication ([place])   . Diverticulosis of colon    on colonoscopy 2008  . GERD (gastroesophageal reflux disease)    bravo pH study 2008  . Gout   . H/O aortic valve replacement with porcine valve    2006, 2013  . Headache(784.0)   . Hemorrhoids    external and internal  . Hiatal hernia   . HTN (hypertension)   . Hx of echocardiogram    a. Echo  (post AVR) 12/2011:  mod LVH, EF 60-65%, Gr 2 diast dysfn, mild AI, AVR ok (mean gradient 19 mmHg), MAC, mild BAE  . Hyperlipidemia   . Hypertension   . Hyperthyroidism   . IBS (irritable bowel syndrome)   . Insomnia   . Lower GI bleed 07/16/2015  . OA (osteoarthritis)   . OSA (obstructive sleep apnea)    USES CPAP AS NEEDED  . 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 Periodontitis    chronic with bone loss  . Restless leg syndrome   . S/P aortic valve replacement with bioprosthetic valve 12/06/2011   Redo AVR using 23 mm [facility] Ease pericardial tissue valve    Past Surgical History:  Procedure Laterality Date  . AORTA - FEMORAL ARTERY BYPASS GRAFT    . AORTIC VALVE REPLACEMENT  10/13/2004   33m [place] pericardial tissue valve  . AORTIC VALVE REPLACEMENT  12/06/2011    Procedure: REDO AORTIC VALVE REPLACEMENT (AVR);  Surgeon: CRexene Alberts MD;  [place] [facility]  Service: Open Heart Surgery;  Laterality: N/A;  . BUNIONECTOMY     right  . CARDIAC SURGERY     aorta vavle replacement  . CATARACT EXTRACTION  2009    &   2012   BILATERAL  . COLONOSCOPY  08/31/2011   Procedure: COLONOSCOPY;  Surgeon: RInda Castle MD;  [place] [facility]  Service: Endoscopy;  Laterality: N/A;  . ESOPHAGOGASTRODUODENOSCOPY  08/30/2011   Procedure: ESOPHAGOGASTRODUODENOSCOPY (EGD);  Surgeon: RInda Castle MD;  [place] [facility]  Service: Endoscopy;  Laterality: N/A;  Rm 3005   . HERNIA REPAIR    . JOINT REPLACEMENT     knee  . LEFT HEART CATHETERIZATION WITH CORONARY ANGIOGRAM N/A 11/30/2011   Procedure: LEFT HEART CATHETERIZATION WITH CORONARY ANGIOGRAM;  Surgeon: Burnell Blanks, MD;  Location: [facility] CATH LAB;  Service: Cardiovascular;  Laterality: N/A;  . NASAL SEPTOPLASTY W/ TURBINOPLASTY    . REFRACTIVE SURGERY     bilateral  . RIGHT HEART CATHETERIZATION Bilateral 11/30/2011   Procedure: RIGHT HEART CATH;  Surgeon: Burnell Blanks, MD;  Location: [facility] CATH LAB;  Service: Cardiovascular;  Laterality: Bilateral;  . right knee arthroscopy    . TEE WITHOUT CARDIOVERSION N/A 10/27/2015   Procedure: TRANSESOPHAGEAL ECHOCARDIOGRAM (TEE);  Surgeon: Lelon Perla, MD;  Location: [facility] [facility] ENDOSCOPY;  Service: Cardiovascular;  Laterality: N/A;    Social History   Social History  . Marital status: Married    Spouse name: N/A  . Number of children: 0  . Years of education: N/A   Occupational History  . retired    Social History Main Topics  . Smoking status: Former Smoker    Quit date: 09/30/1971  . Smokeless tobacco: Never Used     Comment: Quit August 973  . Alcohol use No  . Drug use: No  . Sexual activity: Not Currently   Other Topics Concern  . Not on file   Social History Narrative   ** Merged History Encounter **        Family  History  Problem Relation Age of Onset  . Heart disease Father   . Osteoarthritis Mother   . Hypertension Sister   . Hyperlipidemia    . Cancer Maternal Aunt     ROS: no fevers or chills, productive cough, hemoptysis, dysphasia, odynophagia, melena, hematochezia, dysuria, hematuria, rash, seizure activity, orthopnea, PND, pedal edema, claudication. Remaining systems are negative.  Physical Exam: Well-developed well-nourished in no acute distress.  Skin is warm and dry.  HEENT is normal.  Neck is supple.  Chest is clear to auscultation with normal expansion.  Cardiovascular exam is regular rate and rhythm. 2/6 systolic murmur LSB Abdominal exam nontender or distended. No masses palpated. Extremities show no edema. neuro grossly intact  A/P  1 . History of aortic valve replacement-continue SBE prophylaxis. Previous TEE showed moderate paravalvular aortic insufficiency. This certainly could be contributing to his anemia by causing hemolysis. However I discussed this with his oncologist previously. His anemia is felt to be multifactorial including myeloma, GI blood loss with possible contribution from hemolysis. His hematologist felt GI blood loss was the predominant issue though no source apparently has been found. He did not think his laboratories were consistent with hemolysis. He has not required transfusion now in 6-8 weeks and his hemoglobin is now approximately 10. We will therefore not pursue closure of the perivalvular leak with his prosthetic aortic valve but this could be performed in the future if hemolysis is causing his anemia.  2 hypertension-blood pressure is controlled. Continue present medications.  3 hyperlipidemia-continue Pravachol.   4 history of cough syncope.  5 multiple myeloma-management per oncology.  6 anemia-possibly multifactorial including GI blood loss, myeloma and hemolysis. As outlined above.  Kirk Ruths, MD

## 2015-12-24 ENCOUNTER — Encounter: Payer: Self-pay | Admitting: Cardiology

## 2015-12-24 ENCOUNTER — Ambulatory Visit (INDEPENDENT_AMBULATORY_CARE_PROVIDER_SITE_OTHER): Payer: Medicare Other | Admitting: Cardiology

## 2015-12-24 VITALS — BP 131/71 | HR 60 | Ht 69.0 in | Wt 184.6 lb

## 2015-12-24 DIAGNOSIS — R55 Syncope and collapse: Secondary | ICD-10-CM

## 2015-12-24 DIAGNOSIS — I10 Essential (primary) hypertension: Secondary | ICD-10-CM | POA: Diagnosis not present

## 2015-12-24 DIAGNOSIS — R05 Cough: Secondary | ICD-10-CM | POA: Diagnosis not present

## 2015-12-24 DIAGNOSIS — Z952 Presence of prosthetic heart valve: Secondary | ICD-10-CM

## 2015-12-24 DIAGNOSIS — E7800 Pure hypercholesterolemia, unspecified: Secondary | ICD-10-CM

## 2015-12-24 NOTE — Patient Instructions (Signed)
Your physician recommends that you schedule a follow-up appointment in: 3 MONTHS WITH DR CRENSHAW  

## 2016-01-12 ENCOUNTER — Ambulatory Visit (HOSPITAL_COMMUNITY): Payer: Medicare Other | Attending: Cardiology

## 2016-01-12 ENCOUNTER — Other Ambulatory Visit: Payer: Self-pay

## 2016-01-12 DIAGNOSIS — G4733 Obstructive sleep apnea (adult) (pediatric): Secondary | ICD-10-CM | POA: Diagnosis not present

## 2016-01-12 DIAGNOSIS — I082 Rheumatic disorders of both aortic and tricuspid valves: Secondary | ICD-10-CM | POA: Diagnosis not present

## 2016-01-12 DIAGNOSIS — E785 Hyperlipidemia, unspecified: Secondary | ICD-10-CM | POA: Insufficient documentation

## 2016-01-12 DIAGNOSIS — I351 Nonrheumatic aortic (valve) insufficiency: Secondary | ICD-10-CM

## 2016-01-12 DIAGNOSIS — E119 Type 2 diabetes mellitus without complications: Secondary | ICD-10-CM | POA: Insufficient documentation

## 2016-01-12 DIAGNOSIS — Z953 Presence of xenogenic heart valve: Secondary | ICD-10-CM | POA: Diagnosis not present

## 2016-01-12 DIAGNOSIS — I10 Essential (primary) hypertension: Secondary | ICD-10-CM | POA: Diagnosis not present

## 2016-01-21 ENCOUNTER — Telehealth: Payer: Self-pay | Admitting: Cardiology

## 2016-01-21 ENCOUNTER — Ambulatory Visit: Payer: Self-pay | Admitting: Cardiology

## 2016-01-21 NOTE — Telephone Encounter (Signed)
01/21/2016 Rcvd records from [facility] for apt with Dr. Stanford Breed on 04/04/2016. Gave to [employer]. mwc

## 2016-03-22 DIAGNOSIS — K922 Gastrointestinal hemorrhage, unspecified: Secondary | ICD-10-CM | POA: Diagnosis not present

## 2016-03-22 DIAGNOSIS — R55 Syncope and collapse: Secondary | ICD-10-CM | POA: Diagnosis not present

## 2016-03-22 DIAGNOSIS — R404 Transient alteration of awareness: Secondary | ICD-10-CM | POA: Diagnosis not present

## 2016-03-23 ENCOUNTER — Inpatient Hospital Stay (HOSPITAL_COMMUNITY)
Admission: EM | Admit: 2016-03-23 | Discharge: 2016-03-23 | DRG: 871 | Disposition: A | Discharge status: Other Acute Inpatient Hospital | Payer: Medicare Other | Attending: Emergency Medicine | Admitting: Emergency Medicine

## 2016-03-23 ENCOUNTER — Emergency Department (HOSPITAL_COMMUNITY): Payer: Medicare Other

## 2016-03-23 ENCOUNTER — Encounter (HOSPITAL_COMMUNITY): Payer: Self-pay | Admitting: Emergency Medicine

## 2016-03-23 DIAGNOSIS — G4733 Obstructive sleep apnea (adult) (pediatric): Secondary | ICD-10-CM | POA: Diagnosis present

## 2016-03-23 DIAGNOSIS — Z809 Family history of malignant neoplasm, unspecified: Secondary | ICD-10-CM

## 2016-03-23 DIAGNOSIS — Z9989 Dependence on other enabling machines and devices: Secondary | ICD-10-CM | POA: Diagnosis not present

## 2016-03-23 DIAGNOSIS — R55 Syncope and collapse: Secondary | ICD-10-CM | POA: Diagnosis present

## 2016-03-23 DIAGNOSIS — G2581 Restless legs syndrome: Secondary | ICD-10-CM | POA: Diagnosis present

## 2016-03-23 DIAGNOSIS — J9601 Acute respiratory failure with hypoxia: Secondary | ICD-10-CM | POA: Diagnosis not present

## 2016-03-23 DIAGNOSIS — M1990 Unspecified osteoarthritis, unspecified site: Secondary | ICD-10-CM | POA: Diagnosis present

## 2016-03-23 DIAGNOSIS — C9000 Multiple myeloma not having achieved remission: Secondary | ICD-10-CM | POA: Diagnosis present

## 2016-03-23 DIAGNOSIS — J09X2 Influenza due to identified novel influenza A virus with other respiratory manifestations: Secondary | ICD-10-CM | POA: Diagnosis not present

## 2016-03-23 DIAGNOSIS — I350 Nonrheumatic aortic (valve) stenosis: Secondary | ICD-10-CM | POA: Diagnosis present

## 2016-03-23 DIAGNOSIS — A419 Sepsis, unspecified organism: Secondary | ICD-10-CM | POA: Diagnosis not present

## 2016-03-23 DIAGNOSIS — K219 Gastro-esophageal reflux disease without esophagitis: Secondary | ICD-10-CM | POA: Diagnosis present

## 2016-03-23 DIAGNOSIS — N400 Enlarged prostate without lower urinary tract symptoms: Secondary | ICD-10-CM | POA: Diagnosis present

## 2016-03-23 DIAGNOSIS — I110 Hypertensive heart disease with heart failure: Secondary | ICD-10-CM | POA: Diagnosis present

## 2016-03-23 DIAGNOSIS — N3010 Interstitial cystitis (chronic) without hematuria: Secondary | ICD-10-CM | POA: Diagnosis present

## 2016-03-23 DIAGNOSIS — K921 Melena: Secondary | ICD-10-CM

## 2016-03-23 DIAGNOSIS — Z79899 Other long term (current) drug therapy: Secondary | ICD-10-CM | POA: Diagnosis not present

## 2016-03-23 DIAGNOSIS — J45909 Unspecified asthma, uncomplicated: Secondary | ICD-10-CM | POA: Diagnosis present

## 2016-03-23 DIAGNOSIS — Z87891 Personal history of nicotine dependence: Secondary | ICD-10-CM

## 2016-03-23 DIAGNOSIS — Z885 Allergy status to narcotic agent status: Secondary | ICD-10-CM

## 2016-03-23 DIAGNOSIS — J9600 Acute respiratory failure, unspecified whether with hypoxia or hypercapnia: Secondary | ICD-10-CM

## 2016-03-23 DIAGNOSIS — Z888 Allergy status to other drugs, medicaments and biological substances status: Secondary | ICD-10-CM

## 2016-03-23 DIAGNOSIS — Z006 Encounter for examination for normal comparison and control in clinical research program: Secondary | ICD-10-CM

## 2016-03-23 DIAGNOSIS — Z96659 Presence of unspecified artificial knee joint: Secondary | ICD-10-CM | POA: Diagnosis present

## 2016-03-23 DIAGNOSIS — E785 Hyperlipidemia, unspecified: Secondary | ICD-10-CM | POA: Diagnosis present

## 2016-03-23 DIAGNOSIS — J101 Influenza due to other identified influenza virus with other respiratory manifestations: Secondary | ICD-10-CM

## 2016-03-23 DIAGNOSIS — D649 Anemia, unspecified: Secondary | ICD-10-CM | POA: Diagnosis present

## 2016-03-23 DIAGNOSIS — E119 Type 2 diabetes mellitus without complications: Secondary | ICD-10-CM | POA: Diagnosis present

## 2016-03-23 DIAGNOSIS — I5033 Acute on chronic diastolic (congestive) heart failure: Secondary | ICD-10-CM

## 2016-03-23 DIAGNOSIS — K922 Gastrointestinal hemorrhage, unspecified: Secondary | ICD-10-CM | POA: Diagnosis present

## 2016-03-23 DIAGNOSIS — I517 Cardiomegaly: Secondary | ICD-10-CM | POA: Diagnosis not present

## 2016-03-23 DIAGNOSIS — Z8249 Family history of ischemic heart disease and other diseases of the circulatory system: Secondary | ICD-10-CM

## 2016-03-23 DIAGNOSIS — Z953 Presence of xenogenic heart valve: Secondary | ICD-10-CM

## 2016-03-23 DIAGNOSIS — Z8261 Family history of arthritis: Secondary | ICD-10-CM

## 2016-03-23 DIAGNOSIS — Z7984 Long term (current) use of oral hypoglycemic drugs: Secondary | ICD-10-CM

## 2016-03-23 DIAGNOSIS — Z7982 Long term (current) use of aspirin: Secondary | ICD-10-CM

## 2016-03-23 LAB — URINALYSIS, ROUTINE W REFLEX MICROSCOPIC
BILIRUBIN URINE: NEGATIVE
Bacteria, UA: NONE SEEN
GLUCOSE, UA: NEGATIVE mg/dL
KETONES UR: NEGATIVE mg/dL
LEUKOCYTES UA: NEGATIVE
NITRITE: NEGATIVE
PH: 5 (ref 5.0–8.0)
Protein, ur: 30 mg/dL — AB
SPECIFIC GRAVITY, URINE: 1.024 (ref 1.005–1.030)
SQUAMOUS EPITHELIAL / LPF: NONE SEEN

## 2016-03-23 LAB — LACTATE DEHYDROGENASE: LDH: 683 U/L — ABNORMAL HIGH (ref 98–192)

## 2016-03-23 LAB — CBC WITH DIFFERENTIAL/PLATELET
BASOS PCT: 1 %
Basophils Absolute: 0 10*3/uL (ref 0.0–0.1)
EOS ABS: 0 10*3/uL (ref 0.0–0.7)
Eosinophils Relative: 0 %
HCT: 16.2 % — ABNORMAL LOW (ref 39.0–52.0)
Hemoglobin: 4.7 g/dL — CL (ref 13.0–17.0)
Lymphocytes Relative: 17 %
Lymphs Abs: 0.6 10*3/uL — ABNORMAL LOW (ref 0.7–4.0)
MCH: 26 pg (ref 26.0–34.0)
MCHC: 29 g/dL — AB (ref 30.0–36.0)
MCV: 89.5 fL (ref 78.0–100.0)
MONO ABS: 0.2 10*3/uL (ref 0.1–1.0)
Monocytes Relative: 6 %
NEUTROS ABS: 3 10*3/uL (ref 1.7–7.7)
NEUTROS PCT: 76 %
PLATELETS: 145 10*3/uL — AB (ref 150–400)
RBC: 1.81 MIL/uL — ABNORMAL LOW (ref 4.22–5.81)
RDW: 21.8 % — ABNORMAL HIGH (ref 11.5–15.5)
WBC: 3.8 10*3/uL — ABNORMAL LOW (ref 4.0–10.5)

## 2016-03-23 LAB — BASIC METABOLIC PANEL
ANION GAP: 9 (ref 5–15)
BUN: 14 mg/dL (ref 6–20)
CO2: 22 mmol/L (ref 22–32)
Calcium: 9.6 mg/dL (ref 8.9–10.3)
Chloride: 101 mmol/L (ref 101–111)
Creatinine, Ser: 0.67 mg/dL (ref 0.61–1.24)
GLUCOSE: 174 mg/dL — AB (ref 65–99)
POTASSIUM: 4.2 mmol/L (ref 3.5–5.1)
Sodium: 132 mmol/L — ABNORMAL LOW (ref 135–145)

## 2016-03-23 LAB — HEPATIC FUNCTION PANEL
ALT: 17 U/L (ref 17–63)
AST: 59 U/L — ABNORMAL HIGH (ref 15–41)
Albumin: 3.1 g/dL — ABNORMAL LOW (ref 3.5–5.0)
Alkaline Phosphatase: 116 U/L (ref 38–126)
BILIRUBIN INDIRECT: 1.6 mg/dL — AB (ref 0.3–0.9)
Bilirubin, Direct: 0.6 mg/dL — ABNORMAL HIGH (ref 0.1–0.5)
TOTAL PROTEIN: 6.1 g/dL — AB (ref 6.5–8.1)
Total Bilirubin: 2.2 mg/dL — ABNORMAL HIGH (ref 0.3–1.2)

## 2016-03-23 LAB — PREPARE RBC (CROSSMATCH)

## 2016-03-23 LAB — I-STAT CG4 LACTIC ACID, ED
Lactic Acid, Venous: 1.18 mmol/L (ref 0.5–1.9)
Lactic Acid, Venous: 1.98 mmol/L (ref 0.5–1.9)

## 2016-03-23 LAB — INFLUENZA PANEL BY PCR (TYPE A & B)
Influenza A By PCR: POSITIVE — AB
Influenza B By PCR: NEGATIVE

## 2016-03-23 LAB — POC OCCULT BLOOD, ED: Fecal Occult Bld: NEGATIVE

## 2016-03-23 LAB — DIRECT ANTIGLOBULIN TEST (NOT AT ARMC)
DAT, IgG: NEGATIVE
DAT, complement: NEGATIVE

## 2016-03-23 LAB — CBG MONITORING, ED: GLUCOSE-CAPILLARY: 155 mg/dL — AB (ref 65–99)

## 2016-03-23 MED ORDER — SODIUM CHLORIDE 0.9 % IV BOLUS (SEPSIS)
500.0000 mL | Freq: Once | INTRAVENOUS | Status: AC
  Administered 2016-03-23: 500 mL via INTRAVENOUS

## 2016-03-23 MED ORDER — VANCOMYCIN HCL 10 G IV SOLR
1250.0000 mg | Freq: Two times a day (BID) | INTRAVENOUS | Status: DC
  Filled 2016-03-23: qty 1250

## 2016-03-23 MED ORDER — OSELTAMIVIR PHOSPHATE 75 MG PO CAPS
75.0000 mg | ORAL_CAPSULE | Freq: Once | ORAL | Status: AC
  Administered 2016-03-23: 75 mg via ORAL
  Filled 2016-03-23: qty 1

## 2016-03-23 MED ORDER — SODIUM CHLORIDE 0.9 % IV SOLN
10.0000 mL/h | Freq: Once | INTRAVENOUS | Status: AC
  Administered 2016-03-23: 10 mL/h via INTRAVENOUS

## 2016-03-23 MED ORDER — ACETAMINOPHEN 500 MG PO TABS
1000.0000 mg | ORAL_TABLET | Freq: Once | ORAL | Status: AC
  Administered 2016-03-23: 1000 mg via ORAL
  Filled 2016-03-23: qty 2

## 2016-03-23 MED ORDER — SODIUM CHLORIDE 0.9 % IV BOLUS (SEPSIS)
1000.0000 mL | Freq: Once | INTRAVENOUS | Status: AC
  Administered 2016-03-23: 1000 mL via INTRAVENOUS

## 2016-03-23 MED ORDER — VANCOMYCIN HCL IN DEXTROSE 1-5 GM/200ML-% IV SOLN
1000.0000 mg | Freq: Once | INTRAVENOUS | Status: AC
  Administered 2016-03-23: 1000 mg via INTRAVENOUS
  Filled 2016-03-23: qty 200

## 2016-03-23 MED ORDER — PIPERACILLIN-TAZOBACTAM 3.375 G IVPB: 3.3750 g | Freq: Three times a day (TID) | INTRAVENOUS | Status: DC

## 2016-03-23 MED ORDER — PIPERACILLIN-TAZOBACTAM 3.375 G IVPB 30 MIN
3.3750 g | Freq: Once | INTRAVENOUS | Status: AC
  Administered 2016-03-23: 3.375 g via INTRAVENOUS
  Filled 2016-03-23: qty 50

## 2016-03-23 MED ORDER — ACETAMINOPHEN 325 MG PO TABS
650.0000 mg | ORAL_TABLET | Freq: Once | ORAL | Status: AC
  Administered 2016-03-23: 650 mg via ORAL

## 2016-03-23 MED ORDER — FUROSEMIDE 10 MG/ML IJ SOLN
20.0000 mg | Freq: Once | INTRAMUSCULAR | Status: AC
Start: 2016-03-23 — End: 2016-03-23
  Administered 2016-03-23: 20 mg via INTRAVENOUS
  Filled 2016-03-23: qty 2

## 2016-03-23 NOTE — ED Notes (Signed)
Pt leaving department at this time with [facility]. VSS. All paperwork completed and report given to Production assistant, radio at [facility] on MICU.

## 2016-03-23 NOTE — ED Notes (Signed)
Pt attempted to urinate, unsuccessful. Will attempt later.

## 2016-03-23 NOTE — ED Provider Notes (Signed)
[facility] DEPT Provider Note   CSN: [id number] Arrival date & time: 03/23/16  0024  By signing my name below, I, Arianna Nassar, attest that this documentation has been prepared under the direction and in the presence of Orpah Greek, MD.  Electronically Signed: Julien Nordmann, ED Scribe. 03/23/16. 12:56 AM.    History   Chief Complaint Chief Complaint  Patient presents with  . Loss of Consciousness    The history is provided by the patient. No language interpreter was used.   HPI Comments: [name] is a 71 y.o. male who has a PMhx of aortic stenosis, BPH, DMII, diverticulosis of the colon, GERD, HTN, HLD, and multiple myeloma. presents to the Emergency Department by EMS presenting s/p a witnessed syncopal episode that occurred PTA. Per wife, pt stood up from the couch this evening to get ready to go to bed. The wife states that the pt was ambulating to the restroom when he began to get very pale. She says she assisted him to the ground during his syncopal episode. Pt did not hit his head. He notes associated intermittent melena, cough, and fever (tmax 101.1). Pt was seen by the the [facility] earlier today and had a CXR performed that was unremarkable. Pt also had blood work taken and it was shown that his hemoglobin was 5.1. He currently takes decadron, simvastatin, and acyclovir. Pt has an appointment in the morning for a blood transfusion. He denies shortness of breath, vomiting, diarrhea, and dysuria.  Past Medical History:  Diagnosis Date  . Allergic rhinitis   . Anemia 2009  . Aortic stenosis    a. s/p porcine AVR 2006;  b. s/p redo tissue AVR 12/2011 (Dr. Roxy Manns) - preAVR LHC with no CAD  . Asthma   . BPH (benign prostatic hypertrophy)   . Cancer ([place])    multiple myeloma  . Cataract   . Chronic cough   . Chronic interstitial cystitis   . Depression    pt denies  . Diabetes mellitus    type 2  . Diabetes mellitus without complication ([place])   .  Diverticulosis of colon    on colonoscopy 2008  . GERD (gastroesophageal reflux disease)    bravo pH study 2008  . Gout   . H/O aortic valve replacement with porcine valve    2006, 2013  . Headache(784.0)   . Hemorrhoids    external and internal  . Hiatal hernia   . HTN (hypertension)   . Hx of echocardiogram    a. Echo  (post AVR) 12/2011:  mod LVH, EF 60-65%, Gr 2 diast dysfn, mild AI, AVR ok (mean gradient 19 mmHg), MAC, mild BAE  . Hyperlipidemia   . Hypertension   . Hyperthyroidism   . IBS (irritable bowel syndrome)   . Insomnia   . Lower GI bleed 07/16/2015  . OA (osteoarthritis)   . OSA (obstructive sleep apnea)    USES CPAP AS NEEDED  . 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 Periodontitis    chronic with bone loss  . Restless leg syndrome   . S/P aortic valve replacement with bioprosthetic valve 12/06/2011   Redo AVR using 23 mm [facility] Ease pericardial tissue valve    Patient Active Problem List   Diagnosis Date Noted  . 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 Rectal bleeding   . Internal bleeding hemorrhoids   . Symptomatic anemia 07/16/2015  . GI bleed 07/16/2015  . Hemorrhoids 07/16/2015  .  Multiple myeloma not having achieved remission ([place])   . Syncope and collapse   . Acute blood loss anemia 03/17/2015  . Bruit 01/19/2015  . S/P aortic valve replacement with bioprosthetic valve 12/06/2011  . Aortic stenosis 11/30/2011  . Chronic daily headache 11/30/2011  . Iron deficiency anemia, unspecified 09/15/2011  . IBS (irritable bowel syndrome) 09/15/2011  . Syncope 08/29/2011  . Cough syncope 10/13/2010  . HYPERLIPIDEMIA-MIXED 07/02/2008  . Essential hypertension 07/02/2008  . GERD 07/02/2008  . OBSTRUCTIVE SLEEP APNEA 01/15/2007  . ALLERGIC  RHINITIS 01/15/2007  . INSOMNIA 01/15/2007  . OSTEOARTHRITIS 12/02/2006  . BPH (benign prostatic hyperplasia) 12/02/2006  . H/O aortic valve replacement 12/02/2006     Past Surgical History:  Procedure Laterality Date  . AORTA - FEMORAL ARTERY BYPASS GRAFT    . AORTIC VALVE REPLACEMENT  10/13/2004   2m [facility] [place] pericardial tissue valve  . AORTIC VALVE REPLACEMENT  12/06/2011   Procedure: REDO AORTIC VALVE REPLACEMENT (AVR);  Surgeon: CRexene Alberts MD;  [facility]  Service: Open Heart Surgery;  Laterality: N/A;  . BUNIONECTOMY     right  . CARDIAC SURGERY     aorta vavle replacement  . CATARACT EXTRACTION  2009    &   2012   BILATERAL  . COLONOSCOPY  08/31/2011   Procedure: COLONOSCOPY;  Surgeon: RInda Castle MD;  [facility]  Service: Endoscopy;  Laterality: N/A;  . ESOPHAGOGASTRODUODENOSCOPY  08/30/2011   Procedure: ESOPHAGOGASTRODUODENOSCOPY (EGD);  Surgeon: RInda Castle MD;  [facility]  Service: Endoscopy;  Laterality: N/A;  Rm 3005   . HERNIA REPAIR    . JOINT REPLACEMENT     knee  . LEFT HEART CATHETERIZATION WITH CORONARY ANGIOGRAM N/A 11/30/2011   Procedure: LEFT HEART CATHETERIZATION WITH CORONARY ANGIOGRAM;  Surgeon: CBurnell Blanks MD;  [facility];  Service: Cardiovascular;  Laterality: N/A;  . NASAL SEPTOPLASTY W/ TURBINOPLASTY    . REFRACTIVE SURGERY     bilateral  . RIGHT HEART CATHETERIZATION Bilateral 11/30/2011   Procedure: RIGHT HEART CATH;  Surgeon: CBurnell Blanks MD;  [facility];  Service: Cardiovascular;  Laterality: Bilateral;  . right knee arthroscopy    . TEE WITHOUT CARDIOVERSION N/A 10/27/2015   Procedure: TRANSESOPHAGEAL ECHOCARDIOGRAM (TEE);  Surgeon: BLelon Perla MD;  [facility];  Service: Cardiovascular;  Laterality: N/A;       Home Medications    Prior to Admission medications   Medication Sig Start Date End Date Taking? Authorizing Provider  acetaminophen (TYLENOL) 325 MG tablet Take 650 mg by mouth every 6 (six) hours as needed for mild pain.   Yes Historical Provider, MD  acyclovir (ZOVIRAX) 800 MG  tablet Take 0.5 tablets by mouth 2 (two) times daily.   Yes Historical Provider, MD  aspirin EC 81 MG tablet Take 81 mg by mouth daily.   Yes Historical Provider, MD  carbidopa-levodopa (SINEMET IR) 25-100 MG tablet Take 3 tablets by mouth 4 (four) times daily.    Yes Historical Provider, MD  cetirizine (ZYRTEC) 10 MG tablet Take 10 mg by mouth daily.   Yes Historical Provider, MD  cyclobenzaprine (FLEXERIL) 10 MG tablet Take 5 mg by mouth 3 (three) times daily as needed for muscle spasms.   Yes Historical Provider, MD  dexamethasone (DECADRON) 4 MG tablet Take 20 mg by mouth every 7 (seven) days. days 1, 8, 15 and 22 of 28 day cycle. 11/19/15  Yes Historical Provider, MD  diazepam (VALIUM) 2 MG tablet Take 2 mg by mouth at bedtime as needed for anxiety.    Yes Historical Provider, MD  docusate sodium (COLACE) 100 MG capsule Take 100 mg by mouth 2 (two) times daily as needed for moderate constipation.  08/20/15  Yes Historical Provider, MD  finasteride (PROSCAR) 5 MG tablet Take 5 mg by mouth daily.   Yes Historical Provider, MD  fish oil-omega-3 fatty acids 1000 MG capsule Take 1 g by mouth daily.    Yes Historical Provider, MD  folic acid (FOLVITE) 1 MG tablet Take 1 mg by mouth daily. 09/24/15  Yes Historical Provider, MD  furosemide (LASIX) 20 MG tablet Take 20 mg by mouth daily as needed for edema.   Yes Historical Provider, MD  gabapentin (NEURONTIN) 800 MG tablet Take 400-800 mg by mouth See admin instructions. Take 1/2 tablet every morning and at bedtime then take 1 tablet at lunch   Yes Historical Provider, MD  HYDROcodone-acetaminophen (NORCO/VICODIN) 5-325 MG tablet Take 1 tablet by mouth every 6 (six) hours as needed for moderate pain.   Yes Historical Provider, MD  hydrocortisone (ANUSOL-HC) 2.5 % rectal cream Place 1 application rectally 2 (two) times daily.   Yes Historical Provider, MD  ketoconazole (NIZORAL) 2 % cream Apply 1 application topically daily as needed for irritation.   Yes  Historical Provider, MD  metFORMIN (GLUCOPHAGE) 500 MG tablet Take 500 mg by mouth 2 (two) times daily with a meal.   Yes Historical Provider, MD  methylcellulose (ARTIFICIAL TEARS) 1 % ophthalmic solution Place 1 drop into both eyes at bedtime as needed (dry eyes).   Yes Historical Provider, MD  pantoprazole (PROTONIX) 40 MG tablet Take 1 tablet (40 mg total) by mouth daily. For GERD 07/16/15  Yes Ripudeep Krystal Eaton, MD  polycarbophil (FIBERCON) 625 MG tablet Take 2 tablets by mouth daily.   Yes Historical Provider, MD  pomalidomide (POMALYST) 4 MG capsule Take 4 mg by mouth See admin instructions. Take with water on days 1-21 then  PATIENT IS OFF MED FOR 7 DAYS, Repeat every 28 days.   Yes Historical Provider, MD  senna-docusate (SENOKOT-S) 8.6-50 MG tablet Take 1 tablet by mouth daily.   Yes Historical Provider, MD  simethicone (MYLICON) 80 MG chewable tablet Chew 80 mg by mouth every 6 (six) hours as needed for flatulence.   Yes Historical Provider, MD  simvastatin (ZOCOR) 40 MG tablet Take 20 mg by mouth daily. 09/24/15  Yes Historical Provider, MD  traMADol (ULTRAM) 50 MG tablet Take 50 mg by mouth every 6 (six) hours as needed for moderate pain.   Yes Historical Provider, MD  zinc oxide 20 % ointment Apply 1 application topically 2 (two) times daily as needed for irritation.   Yes Historical Provider, MD  pravastatin (PRAVACHOL) 40 MG tablet Take 1 tablet (40 mg total) by mouth every evening. Patient not taking: Reported on 03/23/2016 01/19/15   Lelon Perla, MD    Family History Family History  Problem Relation Age of Onset  . Heart disease Father   . Osteoarthritis Mother   . Hypertension Sister   . Hyperlipidemia    . Cancer Maternal Aunt     Social History Social History  Substance Use Topics  . Smoking status: Former Smoker    Quit date: 09/30/1971  . Smokeless tobacco: Never Used     Comment: Quit August 973  . Alcohol use No     Allergies   Avodart [dutasteride];  Spironolactone; Codeine;  Diazepam; Doxazosin; and Feraheme [ferumoxytol]   Review of Systems Review of Systems  Constitutional: Positive for fever.  Respiratory: Positive for cough. Negative for shortness of breath.   Gastrointestinal: Positive for blood in stool (dark). Negative for abdominal pain, diarrhea, nausea and vomiting.  Genitourinary: Negative for dysuria and frequency.  All other systems reviewed and are negative.    Physical Exam Updated Vital Signs BP 108/58   Pulse 83   Temp 98.5 F (36.9 C) (Oral)   Resp 19   Ht _0  (1.778 m)   Wt 180 lb (81.6 kg)   SpO2 98%   BMI 25.83 kg/m   Physical Exam  Constitutional: He is oriented to person, place, and time. He appears well-developed and well-nourished. No distress.  Pale appearing  HENT:  Head: Normocephalic and atraumatic.  Right Ear: Hearing normal.  Left Ear: Hearing normal.  Nose: Nose normal.  Mouth/Throat: Oropharynx is clear and moist and mucous membranes are normal.  Eyes: Conjunctivae and EOM are normal. Pupils are equal, round, and reactive to light.  Neck: Normal range of motion. Neck supple.  Cardiovascular: Regular rhythm, S1 normal and S2 normal.  Exam reveals no gallop and no friction rub.   Murmur heard.  Systolic murmur is present  Pulmonary/Chest: Effort normal and breath sounds normal. No respiratory distress. He exhibits no tenderness.  Abdominal: Soft. Normal appearance and bowel sounds are normal. There is no hepatosplenomegaly. There is no tenderness. There is no rebound, no guarding, no tenderness at McBurney's point and negative Murphy's sign. No hernia.  Musculoskeletal: Normal range of motion.  Neurological: He is alert and oriented to person, place, and time. He has normal strength. No cranial nerve deficit or sensory deficit. Coordination normal. GCS eye subscore is 4. GCS verbal subscore is 5. GCS motor subscore is 6.  Skin: Skin is warm, dry and intact. No rash noted. No cyanosis.    Psychiatric: He has a normal mood and affect. His speech is normal and behavior is normal. Thought content normal.  Nursing note and vitals reviewed.    ED Treatments / Results  DIAGNOSTIC STUDIES: Oxygen Saturation is 98% on RA, normal by my interpretation.  COORDINATION OF CARE:  12:49 AM Discussed treatment plan with pt at bedside and pt agreed to plan.  Labs (all labs ordered are listed, but only abnormal results are displayed) Labs Reviewed  BASIC METABOLIC PANEL - Abnormal; Notable for the following:       Result Value   Sodium 132 (*)    Glucose, Bld 174 (*)    All other components within normal limits  URINALYSIS, ROUTINE W REFLEX MICROSCOPIC - Abnormal; Notable for the following:    Hgb urine dipstick SMALL (*)    Protein, ur 30 (*)    All other components within normal limits  CBC WITH DIFFERENTIAL/PLATELET - Abnormal; Notable for the following:    WBC 3.8 (*)    RBC 1.81 (*)    Hemoglobin 4.7 (*)    HCT 16.2 (*)    MCHC 29.0 (*)    RDW 21.8 (*)    Platelets 145 (*)    Lymphs Abs 0.6 (*)    All other components within normal limits  HEPATIC FUNCTION PANEL - Abnormal; Notable for the following:    Total Protein 6.1 (*)    Albumin 3.1 (*)    AST 59 (*)    Total Bilirubin 2.2 (*)    Bilirubin, Direct 0.6 (*)    Indirect Bilirubin 1.6 (*)  All other components within normal limits  LACTATE DEHYDROGENASE - Abnormal; Notable for the following:    LDH 683 (*)    All other components within normal limits  INFLUENZA PANEL BY PCR (TYPE A & B) - Abnormal; Notable for the following:    Influenza A By PCR POSITIVE (*)    All other components within normal limits  CBG MONITORING, ED - Abnormal; Notable for the following:    Glucose-Capillary 155 (*)    All other components within normal limits  I-STAT CG4 LACTIC ACID, ED - Abnormal; Notable for the following:    Lactic Acid, Venous 1.98 (*)    All other components within normal limits  CULTURE, BLOOD (ROUTINE X  2)  CULTURE, BLOOD (ROUTINE X 2)  MULTIPLE MYELOMA PANEL, SERUM  HAPTOGLOBIN  RETICULOCYTES  FERRITIN  FOLATE  VITAMIN B12  CBG MONITORING, ED  POC OCCULT BLOOD, ED  I-STAT CG4 LACTIC ACID, ED  TYPE AND SCREEN  PREPARE RBC (CROSSMATCH)  DIRECT ANTIGLOBULIN TEST (NOT AT [facility])    EKG  EKG Interpretation  Date/Time:  Wednesday March 23 2016 00:27:48 EST Ventricular Rate:  86 PR Interval:    QRS Duration: 101 QT Interval:  373 QTC Calculation: 447 R Axis:     Text Interpretation:  Sinus rhythm Atrial premature complexes Abnormal R-wave progression, late transition Borderline ST depression, anterolateral leads No significant change since last tracing Confirmed by Clarkson Rosselli  MD, Luddie Boghosian [phone]) on 03/23/2016 1:01:16 AM       Radiology Dg Chest 2 View  Result Date: 03/23/2016 CLINICAL DATA:  Fever.  Passing out. EXAM: CHEST  2 VIEW COMPARISON:  09/24/2015 FINDINGS: Postoperative changes in the mediastinum. Shallow inspiration with elevation of the right hemidiaphragm. Linear atelectasis over the right lung base. Mild cardiac enlargement and pulmonary vascular congestion. No edema or consolidation. No blunting of costophrenic angles. Degenerative changes in the spine. IMPRESSION: Cardiac enlargement with mild vascular congestion. No edema or consolidation. Elevation of right hemidiaphragm with atelectasis in the right lung base. Electronically Signed   By: Lucienne Capers M.D.   On: 03/23/2016 01:15    Procedures Procedures (including critical care time)  Medications Ordered in ED Medications  vancomycin (VANCOCIN) 1,250 mg in sodium chloride 0.9 % 250 mL IVPB (not administered)  piperacillin-tazobactam (ZOSYN) IVPB 3.375 g (not administered)  oseltamivir (TAMIFLU) capsule 75 mg (not administered)  acetaminophen (TYLENOL) tablet 650 mg (650 mg Oral Given 03/23/16 0047)  sodium chloride 0.9 % bolus 1,000 mL (0 mLs Intravenous Stopped 03/23/16 0207)    And  sodium chloride  0.9 % bolus 1,000 mL (0 mLs Intravenous Stopped 03/23/16 0426)    And  sodium chloride 0.9 % bolus 500 mL (0 mLs Intravenous Stopped 03/23/16 0253)  piperacillin-tazobactam (ZOSYN) IVPB 3.375 g (0 g Intravenous Stopped 03/23/16 0202)  vancomycin (VANCOCIN) IVPB 1000 mg/200 mL premix (0 mg Intravenous Stopped 03/23/16 0228)  0.9 %  sodium chloride infusion (0 mL/hr Intravenous Paused 03/23/16 0517)     Initial Impression / Assessment and Plan / ED Course  I have reviewed the triage vital signs and the nursing notes.  Pertinent labs & imaging results that were available during my care of the patient were reviewed by me and considered in my medical decision making (see chart for details).    Patient presents to the emergency department for evaluation of syncope. Patient has a long and complex past medical history. He is currently being treated for multiple myeloma. He also has a history of pancytopenia  with recurrent symptomatic anemia secondary to GI bleeding as well as hemolysis..  Patient found to be profoundly anemic here in the ER. He is also, however, febrile. He was hypotensive at arrival. Sepsis was considered. Patient initiated on sepsis treatment including IV fluids and broad-spectrum antibiotics. Ultimately, however, patient's influenza study was positive, likely causing the fever. Hypotension likely secondary to his anemia.  Discussed with Dr. Jana Hakim, heme/onc. Recommends stopping pomalidomide and increasing dexamethasone to 38m BID.  Discussed admission with hospitalist. [facility] I did discuss the patient with physician access line, was informed that there are no beds for transfers. Patient will therefore require hospitalization here for further transfusion.  CRITICAL CARE Performed by: POrpah Greek  Total critical care time: 35 minutes  Critical care time was exclusive of separately billable procedures and treating other  patients.  Critical care was necessary to treat or prevent imminent or life-threatening deterioration.  Critical care was time spent personally by me on the following activities: development of treatment plan with patient and/or surrogate as well as nursing, discussions with consultants, evaluation of patient's response to treatment, examination of patient, obtaining history from patient or surrogate, ordering and performing treatments and interventions, ordering and review of laboratory studies, ordering and review of radiographic studies, pulse oximetry and re-evaluation of patient's condition.   Final Clinical Impressions(s) / ED Diagnoses   Final diagnoses:  Syncope, unspecified syncope type  Symptomatic anemia  Influenza A   I personally performed the services described in this documentation, which was scribed in my presence. The recorded information has been reviewed and is accurate.  New Prescriptions New Prescriptions   No medications on file     COrpah Greek MD 03/23/16 [phone]

## 2016-03-23 NOTE — ED Notes (Signed)
Blood consent signed and at bedside  

## 2016-03-23 NOTE — H&P (Addendum)
History and Physical    [name] ATF:[id number] DOB: 1945/04/28 DOA: 03/23/2016  PCP: Buzzy Han, MD Patient coming from: Home  Chief Complaint: syncope  HPI: [name] is a 71 y.o. male with medical history significant of aortic stenosis, BPH, diabetes, diverticulosis, GERD, hypertension, hyperlipidemia, multiple myeloma undergoing clinical trial chemotherapy at [facility]. Patient brought to the emergency room after a witnessed syncopal episode. HIstory provided by pt and wife. Patient was feeling particularly weak the night prior to admission when he got up from the couch to ambulate to his bed. We'll ambulate and he became progressively more weak holding onto the walls. Patient's wife was able to get behind him and onto when he syncopized and helped him gently down to the floor. No head trauma. Denies any seizure type activity including loss of bowel or bladder function or tongue biting. Patient does endorse intermittent melena over the last several weeks, for which she has had an ongoing workup with GI at the [place]. Recent capsule study showing possible AVMs for which she was to undergo colonoscopy and possible ablation. Patient was seen in the outpatient setting at the [facility] the day prior to admission and was told he had a hemoglobin of 5 and would need transfusion. Patient deferred admission at that time due to scheduled appointment to [facility] for his next chemotherapy transfusion on the 14th.  Patient endorses intermittent cough and fevers up to 101 over the last 4 days.  Denies any chest pain, palpitations, abdominal pain, nausea, vomiting, neck stiffness, headache, confusion, focal neurological abnormality such as unilateral extremity weakness, dysuria, frequency, lower back pain.   ED Course: Stabilized with IVF anabolic Tamiflu. Objective findings outlined below.  Review of Systems: As per HPI otherwise 10 point review of systems negative.   Ambulatory  Status:no restrictions  Past Medical History:  Diagnosis Date  . Allergic rhinitis   . Anemia 2009  . Aortic stenosis    a. s/p porcine AVR 2006;  b. s/p redo tissue AVR 12/2011 (Dr. Roxy Manns) - preAVR LHC with no CAD  . Asthma   . BPH (benign prostatic hypertrophy)   . Cancer ([place])    multiple myeloma  . Cataract   . Chronic cough   . Chronic interstitial cystitis   . Depression    pt denies  . Diabetes mellitus    type 2  . Diabetes mellitus without complication ([place])   . Diverticulosis of colon    on colonoscopy 2008  . GERD (gastroesophageal reflux disease)    bravo pH study 2008  . Gout   . H/O aortic valve replacement with porcine valve    2006, 2013  . Headache(784.0)   . Hemorrhoids    external and internal  . Hiatal hernia   . HTN (hypertension)   . Hx of echocardiogram    a. Echo  (post AVR) 12/2011:  mod LVH, EF 60-65%, Gr 2 diast dysfn, mild AI, AVR ok (mean gradient 19 mmHg), MAC, mild BAE  . Hyperlipidemia   . Hypertension   . Hyperthyroidism   . IBS (irritable bowel syndrome)   . Insomnia   . Lower GI bleed 07/16/2015  . OA (osteoarthritis)   . OSA (obstructive sleep apnea)    USES CPAP AS NEEDED  . 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 Periodontitis    chronic with bone loss  . Restless leg syndrome   . S/P aortic valve replacement with bioprosthetic valve 12/06/2011   Redo AVR using  23 mm [facility] Ease pericardial tissue valve    Past Surgical History:  Procedure Laterality Date  . AORTA - FEMORAL ARTERY BYPASS GRAFT    . AORTIC VALVE REPLACEMENT  10/13/2004   30m Edwards Perimount pericardial tissue valve  . AORTIC VALVE REPLACEMENT  12/06/2011   Procedure: REDO AORTIC VALVE REPLACEMENT (AVR);  Surgeon: CRexene Alberts MD;  [place] [facility]  Service: Open Heart Surgery;  Laterality: N/A;  . BUNIONECTOMY     right  . CARDIAC SURGERY     aorta vavle replacement  . CATARACT EXTRACTION  2009    &   2012   BILATERAL  .  COLONOSCOPY  08/31/2011   Procedure: COLONOSCOPY;  Surgeon: RInda Castle MD;  [place] [facility]  Service: Endoscopy;  Laterality: N/A;  . ESOPHAGOGASTRODUODENOSCOPY  08/30/2011   Procedure: ESOPHAGOGASTRODUODENOSCOPY (EGD);  Surgeon: RInda Castle MD;  [place] [facility]  Service: Endoscopy;  Laterality: N/A;  Rm 3005   . HERNIA REPAIR    . JOINT REPLACEMENT     knee  . LEFT HEART CATHETERIZATION WITH CORONARY ANGIOGRAM N/A 11/30/2011   Procedure: LEFT HEART CATHETERIZATION WITH CORONARY ANGIOGRAM;  Surgeon: CBurnell Blanks MD;  [facility];  Service: Cardiovascular;  Laterality: N/A;  . NASAL SEPTOPLASTY W/ TURBINOPLASTY    . REFRACTIVE SURGERY     bilateral  . RIGHT HEART CATHETERIZATION Bilateral 11/30/2011   Procedure: RIGHT HEART CATH;  Surgeon: CBurnell Blanks MD;  [facility];  Service: Cardiovascular;  Laterality: Bilateral;  . right knee arthroscopy    . TEE WITHOUT CARDIOVERSION N/A 10/27/2015   Procedure: TRANSESOPHAGEAL ECHOCARDIOGRAM (TEE);  Surgeon: BLelon Perla MD;  [facility];  Service: Cardiovascular;  Laterality: N/A;    Social History   Social History  . Marital status: Married    Spouse name: N/A  . Number of children: 0  . Years of education: N/A   Occupational History  . retired    Social History Main Topics  . Smoking status: Former Smoker    Quit date: 09/30/1971  . Smokeless tobacco: Never Used     Comment: Quit August 973  . Alcohol use No  . Drug use: No  . Sexual activity: Not Currently   Other Topics Concern  . Not on file   Social History Narrative   ** Merged History Encounter **        Allergies  Allergen Reactions  . Avodart [Dutasteride] Other (See Comments)    Lose of use of arms and legs   . Spironolactone Other (See Comments)    Low tolerance   . Codeine Other (See Comments)    GI upset in large doses  . Diazepam Other (See Comments)    Mood changes   .  Doxazosin Other (See Comments)    Dizziness   . Feraheme [Ferumoxytol] Swelling    Pedal edema    Family History  Problem Relation Age of Onset  . Heart disease Father   . Osteoarthritis Mother   . Hypertension Sister   . Hyperlipidemia    . Cancer Maternal Aunt     Prior to Admission medications   Medication Sig Start Date End Date Taking? Authorizing Provider  acetaminophen (TYLENOL) 325 MG tablet Take 650 mg by mouth every 6 (six) hours as needed for mild pain.   Yes Historical Provider, MD  acyclovir (ZOVIRAX) 800 MG tablet Take 0.5 tablets by mouth 2 (two) times daily.   Yes  Historical Provider, MD  aspirin EC 81 MG tablet Take 81 mg by mouth daily.   Yes Historical Provider, MD  carbidopa-levodopa (SINEMET IR) 25-100 MG tablet Take 3 tablets by mouth 4 (four) times daily.    Yes Historical Provider, MD  cetirizine (ZYRTEC) 10 MG tablet Take 10 mg by mouth daily.   Yes Historical Provider, MD  cyclobenzaprine (FLEXERIL) 10 MG tablet Take 5 mg by mouth 3 (three) times daily as needed for muscle spasms.   Yes Historical Provider, MD  dexamethasone (DECADRON) 4 MG tablet Take 20 mg by mouth every 7 (seven) days. days 1, 8, 15 and 22 of 28 day cycle. 11/19/15  Yes Historical Provider, MD  diazepam (VALIUM) 2 MG tablet Take 2 mg by mouth at bedtime as needed for anxiety.    Yes Historical Provider, MD  docusate sodium (COLACE) 100 MG capsule Take 100 mg by mouth 2 (two) times daily as needed for moderate constipation.  08/20/15  Yes Historical Provider, MD  finasteride (PROSCAR) 5 MG tablet Take 5 mg by mouth daily.   Yes Historical Provider, MD  fish oil-omega-3 fatty acids 1000 MG capsule Take 1 g by mouth daily.    Yes Historical Provider, MD  folic acid (FOLVITE) 1 MG tablet Take 1 mg by mouth daily. 09/24/15  Yes Historical Provider, MD  furosemide (LASIX) 20 MG tablet Take 20 mg by mouth daily as needed for edema.   Yes Historical Provider, MD  gabapentin (NEURONTIN) 800 MG tablet  Take 400-800 mg by mouth See admin instructions. Take 1/2 tablet every morning and at bedtime then take 1 tablet at lunch   Yes Historical Provider, MD  HYDROcodone-acetaminophen (NORCO/VICODIN) 5-325 MG tablet Take 1 tablet by mouth every 6 (six) hours as needed for moderate pain.   Yes Historical Provider, MD  hydrocortisone (ANUSOL-HC) 2.5 % rectal cream Place 1 application rectally 2 (two) times daily.   Yes Historical Provider, MD  ketoconazole (NIZORAL) 2 % cream Apply 1 application topically daily as needed for irritation.   Yes Historical Provider, MD  metFORMIN (GLUCOPHAGE) 500 MG tablet Take 500 mg by mouth 2 (two) times daily with a meal.   Yes Historical Provider, MD  methylcellulose (ARTIFICIAL TEARS) 1 % ophthalmic solution Place 1 drop into both eyes at bedtime as needed (dry eyes).   Yes Historical Provider, MD  pantoprazole (PROTONIX) 40 MG tablet Take 1 tablet (40 mg total) by mouth daily. For GERD 07/16/15  Yes Ripudeep Krystal Eaton, MD  polycarbophil (FIBERCON) 625 MG tablet Take 2 tablets by mouth daily.   Yes Historical Provider, MD  pomalidomide (POMALYST) 4 MG capsule Take 4 mg by mouth See admin instructions. Take with water on days 1-21 then  PATIENT IS OFF MED FOR 7 DAYS, Repeat every 28 days.   Yes Historical Provider, MD  senna-docusate (SENOKOT-S) 8.6-50 MG tablet Take 1 tablet by mouth daily.   Yes Historical Provider, MD  simethicone (MYLICON) 80 MG chewable tablet Chew 80 mg by mouth every 6 (six) hours as needed for flatulence.   Yes Historical Provider, MD  simvastatin (ZOCOR) 40 MG tablet Take 20 mg by mouth daily. 09/24/15  Yes Historical Provider, MD  traMADol (ULTRAM) 50 MG tablet Take 50 mg by mouth every 6 (six) hours as needed for moderate pain.   Yes Historical Provider, MD  zinc oxide 20 % ointment Apply 1 application topically 2 (two) times daily as needed for irritation.   Yes Historical Provider, MD  pravastatin (PRAVACHOL)  40 MG tablet Take 1 tablet (40 mg total)  by mouth every evening. Patient not taking: Reported on 03/23/2016 01/19/15   Lelon Perla, MD    Physical Exam: Vitals:   03/23/16 0645 03/23/16 0715 03/23/16 0822 03/23/16 0847  BP: 98/56 114/78  124/72  Pulse: 72 73  90  Resp: _0 Temp:   99.5 F (37.5 C) 102.8 F (39.3 C)  TempSrc:   Oral Oral  SpO2: 100% 100%  96%  Weight:      Height:         General: Distress, sitting up in bed, mentating well. Eyes:  PERRL, EOMI, normal lids, iris ENT:  grossly normal hearing, lips & tongue, mmm Neck:  no LAD, masses or thyromegaly Cardiovascular:  RRR, II/VI systolic murmur, 1+ LE edema.  Respiratory: Coarse breath sounds throughout, increased effort, decreased in the bases, on 2 L nasal cannula. Abdomen:  soft, ntnd, NABS Skin:  no rash or induration seen on limited exam Musculoskeletal:  grossly normal tone BUE/BLE, good ROM, no bony abnormality Psychiatric:  grossly normal mood and affect, speech fluent and appropriate, AOx3 Neurologic:  CN 2-12 grossly intact, moves all extremities in coordinated fashion, sensation intact  Labs on Admission: I have personally reviewed following labs and imaging studies  CBC:  Recent Labs Lab 03/23/16 0048  WBC 3.8*  NEUTROABS 3.0  HGB 4.7*  HCT 16.2*  MCV 89.5  PLT 546*   Basic Metabolic Panel:  Recent Labs Lab 03/23/16 0040  NA 132*  K 4.2  CL 101  CO2 22  GLUCOSE 174*  BUN 14  CREATININE 0.67  CALCIUM 9.6   GFR: Estimated Creatinine Clearance: 90 mL/min (by C-G formula based on SCr of 0.67 mg/dL). Liver Function Tests:  Recent Labs Lab 03/23/16 0048  AST 59*  ALT 17  ALKPHOS 116  BILITOT 2.2*  PROT 6.1*  ALBUMIN 3.1*   No results for input(s): LIPASE, AMYLASE in the last 168 hours. No results for input(s): AMMONIA in the last 168 hours. Coagulation Profile: No results for input(s): INR, PROTIME in the last 168 hours. Cardiac Enzymes: No results for input(s): CKTOTAL, CKMB, CKMBINDEX, TROPONINI in  the last 168 hours. BNP (last 3 results) No results for input(s): PROBNP in the last 8760 hours. HbA1C: No results for input(s): HGBA1C in the last 72 hours. CBG:  Recent Labs Lab 03/23/16 0031  GLUCAP 155*   Lipid Profile: No results for input(s): CHOL, HDL, LDLCALC, TRIG, CHOLHDL, LDLDIRECT in the last 72 hours. Thyroid Function Tests: No results for input(s): TSH, T4TOTAL, FREET4, T3FREE, THYROIDAB in the last 72 hours. Anemia Panel: No results for input(s): VITAMINB12, FOLATE, FERRITIN, TIBC, IRON, RETICCTPCT in the last 72 hours. Urine analysis:    Component Value Date/Time   COLORURINE YELLOW 03/23/2016 0436   APPEARANCEUR CLEAR 03/23/2016 0436   LABSPEC 1.024 03/23/2016 0436   PHURINE 5.0 03/23/2016 0436   GLUCOSEU NEGATIVE 03/23/2016 0436   GLUCOSEU NEGATIVE 04/25/2012 1108   HGBUR SMALL (A) 03/23/2016 0436   BILIRUBINUR NEGATIVE 03/23/2016 0436   KETONESUR NEGATIVE 03/23/2016 0436   PROTEINUR 30 (A) 03/23/2016 0436   UROBILINOGEN 0.2 05/17/2012 1021   NITRITE NEGATIVE 03/23/2016 0436   LEUKOCYTESUR NEGATIVE 03/23/2016 0436    Creatinine Clearance: Estimated Creatinine Clearance: 90 mL/min (by C-G formula based on SCr of 0.67 mg/dL).  Sepsis Labs: _1 (procalcitonin:4,lacticidven:4) )No results found for this or any previous visit (from the past 240 hour(s)).   Radiological Exams on Admission: Dg  Chest 2 View  Result Date: 03/23/2016 CLINICAL DATA:  Fever.  Passing out. EXAM: CHEST  2 VIEW COMPARISON:  09/24/2015 FINDINGS: Postoperative changes in the mediastinum. Shallow inspiration with elevation of the right hemidiaphragm. Linear atelectasis over the right lung base. Mild cardiac enlargement and pulmonary vascular congestion. No edema or consolidation. No blunting of costophrenic angles. Degenerative changes in the spine. IMPRESSION: Cardiac enlargement with mild vascular congestion. No edema or consolidation. Elevation of right hemidiaphragm with  atelectasis in the right lung base. Electronically Signed   By: Lucienne Capers M.D.   On: 03/23/2016 01:15    EKG: Independently reviewed. ST depression in lateral leads. Sinus.   Assessment/Plan Active Problems:   S/P aortic valve replacement with bioprosthetic valve   Syncope and collapse   Multiple myeloma not having achieved remission (HCC)   Symptomatic anemia   GI bleed   Sepsis, unspecified organism ([place])   Acute on chronic diastolic congestive heart failure (HCC)   Influenza A   Acute respiratory failure (HCC)    Sepsis: A time of admission patient met sepsis criteria with an initial blood pressure of 70/48, hypoxemia, temperature 101.1, lactic acid of 1.98 and influenza A positive with an LDH of 683. Patient without proper immune response given the fact that he is undergoing chemotherapy for treatment of multiple myeloma. Patient was given a 2.5 L normal saline bolus with brisk response in his blood pressure as well as started on vancomycin and Zosyn with Tamiflu. Urinalysis obtained and unremarkable. Blood cultures obtained and pending. Chest x-ray without evidence of pneumonia.  Syncope: Likely related to sepsis in the setting of acute GI bleed and profound anemia. A she with significant cardiac history though not likely to be contributory at this time. IV fluids and blood transfusions administered. No further workup due to patient transfer.  Multiple myeloma: [facility] by the oncology team led by Dr. Reola Calkins. Patient reports to be on. Clinical trials with positive early results. Requesting transfer to [facility] as he was due for a transfusion on day of admission here at [facility]. Transfer to [facility] arranged accepting physician notified of need to notify oncology [facility]  GI bleed/anemia: Hemoglobin 4.7 at time of admission. 2 units PRBC ordered with 1 given prior to transfer.  Chronic intermittent problem for patient. Per report patient had a recent capsule study done at the [place] which showed what sounds like colonic AVMs. Reports being scheduled for colonoscopy in the next several weeks for treatment.  Acute on chronic diastolic congestive heart failure: Patient is status post aVR with 60% EF and grade 2 diastolic dysfunction by last echo. At time of admission patient was in acute hypoxic respiratory failure with O2 saturations in the high 80s with post chest x-ray concerning for cardiomegaly and pulmonary vascular congestion. Due to patient's initial presentation and sepsis with hypotension 2.5 L of normal saline were given. Watch patient became hemodynamically stable 20 mg of IV Lasix was administered. Patient was able to maintain O2 saturations on 2 L nasal cannula. Patient may also have some component of ARDS given diagnoses of influenza a. Patient will need to be monitored very closely with potential for respiratory decompensation and intubation. Recommend pulmonary consult at receiving facility, per admitting physicians discretion.   Code Status: FULL  Family Communication: wife Disposition Plan: pending transfer to [place] called: None  Admission status: inpt    Critical Care time: Greater  than 60 minutes was spent in direct patient care, communication with critical care specialists at outside facility, and in active management of patient's medications due to multitude of conditions outlined above.    Montford Barg J MD [facility]  If 7PM-7AM, please contact night-coverage [email or web address] Password [facility]  03/23/2016, 9:40 AM

## 2016-03-23 NOTE — ED Triage Notes (Signed)
Pt arrives via EMS for a syncopal episode x1 at home, witnessed by wife. Pt was pale and dizzy prior to syncopal episode. Pt was standing at the time and wife assisted to ground. Did no hit his head. Orthostatics on scene were good, BP on scene 112/60 but dropped to 98/56 during transport. EMS EKG shows NSR at 90 with PVCs. Pt alert x4 upon his arrival to ED, skin warm and dry.   Pt is reporting hx chronic back and hip pain. Recent rectal bleeding xseveral month. States recently consulted for surgical intervention.

## 2016-03-23 NOTE — Progress Notes (Signed)
Pharmacy Antibiotic Note  [name] is a 71 y.o. male admitted on 03/23/2016 with sepsis.  Pharmacy has been consulted for Vancomycin/Zosyn dosing. WBC 3.8. Renal function ok. Taking decadron for multiple myeloma.   Plan: Vancomycin 1250 mg IV q12h Zosyn 3.375G IV q8h to be infused over 4 hours Trend WBC, temp, renal function  F/U infectious work-up Drug levels as indicated   Height: 5' 10"  (177.8 cm) Weight: 180 lb (81.6 kg) IBW/kg (Calculated) : 73  Temp (24hrs), Avg:99.3 F (37.4 C), Min:98.6 F (37 C), Max:101.1 F (38.4 C)   Recent Labs Lab 03/23/16 0040 03/23/16 0048 03/23/16 0057  WBC  --  3.8*  --   CREATININE 0.67  --   --   LATICACIDVEN  --   --  1.98*    Estimated Creatinine Clearance: 90 mL/min (by C-G formula based on SCr of 0.67 mg/dL).    Allergies  Allergen Reactions  . Avodart [Dutasteride] Other (See Comments)    Lose of use of arms and legs   . Spironolactone Other (See Comments)    Low tolerance   . Codeine Other (See Comments)    GI upset in large doses  . Diazepam Other (See Comments)    Mood changes   . Doxazosin Other (See Comments)    Dizziness   . Feraheme [Ferumoxytol] Swelling    Pedal edema    Narda Bonds 03/23/2016 3:23 AM

## 2016-03-23 NOTE — Discharge Summary (Signed)
Physician Discharge Summary  [name] QBV:[id number] DOB: [birth year] DOA: 03/23/2016  PCP: Buzzy Han, MD  Admit date: 03/23/2016 Discharge date: 03/23/2016  Admitted From: Home Disposition:  [facility]  Recommendations for Outpatient Follow-up:  1. NA  Home Health:NA Equipment/Devices:NA  Discharge Condition: Guarded CODE STATUS: FULL  Diet recommendation: Regular  Brief/Interim Summary: [name] is a 71 y.o. male with medical history significant of  aortic stenosis, BPH, diabetes, diverticulosis, GERD, hypertension, hyperlipidemia, multiple myeloma undergoing clinical trial chemotherapy at [facility]. Patient brought to the emergency room after a witnessed syncopal episode. HIstory provided by pt and wife. Patient was feeling particularly weak the night prior to admission when he got up from the couch to ambulate to his bed. We'll ambulate and he became progressively more weak holding onto the walls. Patient's wife was able to get behind him and onto when he syncopized and helped him gently down to the floor. No head trauma. Denies any seizure type activity including loss of bowel or bladder function or tongue biting. Patient does endorse intermittent melena over the last several weeks, for which she has had an ongoing workup with GI at the [place]. Recent capsule study showing possible AVMs for which she was to undergo colonoscopy and possible ablation. Patient was seen in the outpatient setting at the [facility] the day prior to admission and was told he had a hemoglobin of 5 and would need transfusion. Patient deferred admission at that time due to scheduled appointment to [facility] for his next chemotherapy transfusion on the 14th.  Patient endorses intermittent cough and fevers up to 101 over the last 4 days.  Denies any chest pain, palpitations, abdominal pain, nausea, vomiting, neck stiffness, headache, confusion, focal neurological abnormality such as unilateral  extremity weakness, dysuria, frequency, lower back pain.  Discharge Diagnoses:  Active Problems:   S/P aortic valve replacement with bioprosthetic valve   Syncope and collapse   Multiple myeloma not having achieved remission (HCC)   Symptomatic anemia   GI bleed   Sepsis, unspecified organism ([place])   Acute on chronic diastolic congestive heart failure (HCC)   Influenza A   Acute respiratory failure (HCC)   Sepsis: A time of admission patient met sepsis criteria with an initial blood pressure of 70/48, hypoxemia, temperature 101.1, lactic acid of 1.98 and influenza A positive with an LDH of 683. Patient without proper immune response given the fact that he is undergoing chemotherapy for treatment of multiple myeloma. Patient was given a 2.5 L normal saline bolus with brisk response in his blood pressure as well as started on vancomycin and Zosyn with Tamiflu. Urinalysis obtained and unremarkable. Blood cultures obtained and pending. Chest x-ray without evidence of pneumonia.  Syncope: Likely related to sepsis in the setting of acute GI bleed and profound anemia. A she with significant cardiac history though not likely to be contributory at this time. IV fluids and blood transfusions administered. No further workup due to patient transfer.  Multiple myeloma: Patient undergoing treatment [facility] by the oncology team led by Dr. Reola Calkins. Patient reports to be on. Clinical trials with positive early results. Requesting transfer to [facility] as he was due for a transfusion on day of admission here at [facility]. Transfer to [facility] arranged accepting physician notified of need to notify oncology at [facility].  GI bleed/anemia: Hemoglobin 4.7 at time of admission. 2 units PRBC ordered with 1 given prior to transfer. Chronic intermittent  problem for patient. Per report patient had a recent capsule study done at the [place] which showed what sounds  like colonic AVMs. Reports being scheduled for colonoscopy in the next several weeks for treatment.  Acute on chronic diastolic congestive heart failure: Patient is status post aVR with 60% EF and grade 2 diastolic dysfunction by last echo. At time of admission patient was in acute hypoxic respiratory failure with O2 saturations in the high 80s with post chest x-ray concerning for cardiomegaly and pulmonary vascular congestion. Due to patient's initial presentation and sepsis with hypotension 2.5 L of normal saline were given. Watch patient became hemodynamically stable 20 mg of IV Lasix was administered. Patient was able to maintain O2 saturations on 2 L nasal cannula. Patient may also have some component of ARDS given diagnoses of influenza a. Patient will need to be monitored very closely with potential for respiratory decompensation and intubation. Recommend pulmonary consult at receiving facility, per admitting physicians discretion.   Discharge Instructions Further treatment per receiving treatment [facility]. Greatly appreciate their assistance in the management of this patient.  Allergies as of 03/23/2016      Reactions   Avodart [dutasteride] Other (See Comments)   Lose of use of arms and legs   Spironolactone Other (See Comments)   Low tolerance    Codeine Other (See Comments)   GI upset in large doses   Diazepam Other (See Comments)   Mood changes   Doxazosin Other (See Comments)   Dizziness   Feraheme [ferumoxytol] Swelling   Pedal edema      Medication List    TAKE these medications   acetaminophen 325 MG tablet Commonly known as:  TYLENOL Take 650 mg by mouth every 6 (six) hours as needed for mild pain.   acyclovir 800 MG tablet Commonly known as:  ZOVIRAX Take 0.5 tablets by mouth 2 (two) times daily.   aspirin EC 81 MG tablet Take 81 mg by mouth daily.   carbidopa-levodopa 25-100 MG tablet Commonly known as:  SINEMET IR Take 3 tablets by mouth 4 (four) times  daily.   cetirizine 10 MG tablet Commonly known as:  ZYRTEC Take 10 mg by mouth daily.   cyclobenzaprine 10 MG tablet Commonly known as:  FLEXERIL Take 5 mg by mouth 3 (three) times daily as needed for muscle spasms.   dexamethasone 4 MG tablet Commonly known as:  DECADRON Take 20 mg by mouth every 7 (seven) days. days 1, 8, 15 and 22 of 28 day cycle.   diazepam 2 MG tablet Commonly known as:  VALIUM Take 2 mg by mouth at bedtime as needed for anxiety.   docusate sodium 100 MG capsule Commonly known as:  COLACE Take 100 mg by mouth 2 (two) times daily as needed for moderate constipation.   FIBERCON 625 MG tablet Generic drug:  polycarbophil Take 2 tablets by mouth daily.   finasteride 5 MG tablet Commonly known as:  PROSCAR Take 5 mg by mouth daily.   fish oil-omega-3 fatty acids 1000 MG capsule Take 1 g by mouth daily.   folic acid 1 MG tablet Commonly known as:  FOLVITE Take 1 mg by mouth daily.   furosemide 20 MG tablet Commonly known as:  LASIX Take 20 mg by mouth daily as needed for edema.   gabapentin 800 MG tablet Commonly known as:  NEURONTIN Take 400-800 mg by mouth See admin instructions. Take 1/2 tablet every morning and at bedtime then take 1 tablet at lunch  HYDROcodone-acetaminophen 5-325 MG tablet Commonly known as:  NORCO/VICODIN Take 1 tablet by mouth every 6 (six) hours as needed for moderate pain.   hydrocortisone 2.5 % rectal cream Commonly known as:  ANUSOL-HC Place 1 application rectally 2 (two) times daily.   ketoconazole 2 % cream Commonly known as:  NIZORAL Apply 1 application topically daily as needed for irritation.   metFORMIN 500 MG tablet Commonly known as:  GLUCOPHAGE Take 500 mg by mouth 2 (two) times daily with a meal.   methylcellulose 1 % ophthalmic solution Commonly known as:  ARTIFICIAL TEARS Place 1 drop into both eyes at bedtime as needed (dry eyes).   pantoprazole 40 MG tablet Commonly known as:  PROTONIX Take  1 tablet (40 mg total) by mouth daily. For GERD   pomalidomide 4 MG capsule Commonly known as:  POMALYST Take 4 mg by mouth See admin instructions. Take with water on days 1-21 then  PATIENT IS OFF MED FOR 7 DAYS, Repeat every 28 days.   pravastatin 40 MG tablet Commonly known as:  PRAVACHOL Take 1 tablet (40 mg total) by mouth every evening.   senna-docusate 8.6-50 MG tablet Commonly known as:  Senokot-S Take 1 tablet by mouth daily.   simethicone 80 MG chewable tablet Commonly known as:  MYLICON Chew 80 mg by mouth every 6 (six) hours as needed for flatulence.   simvastatin 40 MG tablet Commonly known as:  ZOCOR Take 20 mg by mouth daily.   traMADol 50 MG tablet Commonly known as:  ULTRAM Take 50 mg by mouth every 6 (six) hours as needed for moderate pain.   zinc oxide 20 % ointment Apply 1 application topically 2 (two) times daily as needed for irritation.       Allergies  Allergen Reactions  . Avodart [Dutasteride] Other (See Comments)    Lose of use of arms and legs   . Spironolactone Other (See Comments)    Low tolerance   . Codeine Other (See Comments)    GI upset in large doses  . Diazepam Other (See Comments)    Mood changes   . Doxazosin Other (See Comments)    Dizziness   . Feraheme [Ferumoxytol] Swelling    Pedal edema    Consultations:  [facility] - Dr. Ulice Dash   Procedures/Studies: Dg Chest 2 View  Result Date: 03/23/2016 CLINICAL DATA:  Fever.  Passing out. EXAM: CHEST  2 VIEW COMPARISON:  09/24/2015 FINDINGS: Postoperative changes in the mediastinum. Shallow inspiration with elevation of the right hemidiaphragm. Linear atelectasis over the right lung base. Mild cardiac enlargement and pulmonary vascular congestion. No edema or consolidation. No blunting of costophrenic angles. Degenerative changes in the spine. IMPRESSION: Cardiac enlargement with mild vascular congestion. No edema or consolidation. Elevation of right hemidiaphragm with  atelectasis in the right lung base. Electronically Signed   By: Lucienne Capers M.D.   On: 03/23/2016 01:15      Subjective: Pt feeling better after ABX, Tamiflu, and IVF.   Discharge Exam: Vitals:   03/23/16 0822 03/23/16 0847  BP:  124/72  Pulse:  90  Resp:  25  Temp: 99.5 F (37.5 C) 102.8 F (39.3 C)   Vitals:   03/23/16 0645 03/23/16 0715 03/23/16 0822 03/23/16 0847  BP: 98/56 114/78  124/72  Pulse: 72 73  90  Resp: 24 22  25   Temp:   99.5 F (37.5 C) 102.8 F (39.3 C)  TempSrc:   Oral Oral  SpO2: 100% 100%  96%  Weight:      Height:        General: Pt is alert, awake, Unable to lay flat. Sitting up in mild distress. Cardiovascular: RRR, 2/6 systolic murmur, no rubs, no gallops Respiratory: Coarse breath sounds throughout with increased effort on 2 L nasal cannula Abdominal: Soft, NT, ND, bowel sounds + Extremities: 1+ lower extremity pitting edema:, no cyanosis    The results of significant diagnostics from this hospitalization (including imaging, microbiology, ancillary and laboratory) are listed below for reference.     Microbiology: No results found for this or any previous visit (from the past 240 hour(s)).   Labs: BNP (last 3 results) No results for input(s): BNP in the last 8760 hours. Basic Metabolic Panel:  Recent Labs Lab 03/23/16 0040  NA 132*  K 4.2  CL 101  CO2 22  GLUCOSE 174*  BUN 14  CREATININE 0.67  CALCIUM 9.6   Liver Function Tests:  Recent Labs Lab 03/23/16 0048  AST 59*  ALT 17  ALKPHOS 116  BILITOT 2.2*  PROT 6.1*  ALBUMIN 3.1*   No results for input(s): LIPASE, AMYLASE in the last 168 hours. No results for input(s): AMMONIA in the last 168 hours. CBC:  Recent Labs Lab 03/23/16 0048  WBC 3.8*  NEUTROABS 3.0  HGB 4.7*  HCT 16.2*  MCV 89.5  PLT 145*   Cardiac Enzymes: No results for input(s): CKTOTAL, CKMB, CKMBINDEX, TROPONINI in the last 168 hours. BNP: Invalid input(s): POCBNP CBG:  Recent  Labs Lab 03/23/16 0031  GLUCAP 155*   D-Dimer No results for input(s): DDIMER in the last 72 hours. Hgb A1c No results for input(s): HGBA1C in the last 72 hours. Lipid Profile No results for input(s): CHOL, HDL, LDLCALC, TRIG, CHOLHDL, LDLDIRECT in the last 72 hours. Thyroid function studies No results for input(s): TSH, T4TOTAL, T3FREE, THYROIDAB in the last 72 hours.  Invalid input(s): FREET3 Anemia work up No results for input(s): VITAMINB12, FOLATE, FERRITIN, TIBC, IRON, RETICCTPCT in the last 72 hours. Urinalysis    Component Value Date/Time   COLORURINE YELLOW 03/23/2016 0436   APPEARANCEUR CLEAR 03/23/2016 0436   LABSPEC 1.024 03/23/2016 0436   PHURINE 5.0 03/23/2016 0436   GLUCOSEU NEGATIVE 03/23/2016 0436   GLUCOSEU NEGATIVE 04/25/2012 1108   HGBUR SMALL (A) 03/23/2016 0436   BILIRUBINUR NEGATIVE 03/23/2016 0436   KETONESUR NEGATIVE 03/23/2016 0436   PROTEINUR 30 (A) 03/23/2016 0436   UROBILINOGEN 0.2 05/17/2012 1021   NITRITE NEGATIVE 03/23/2016 0436   LEUKOCYTESUR NEGATIVE 03/23/2016 0436   Sepsis Labs Invalid input(s): PROCALCITONIN,  WBC,  LACTICIDVEN Microbiology No results found for this or any previous visit (from the past 240 hour(s)).   Time coordinating discharge: Over 30 minutes  SIGNED:   Waldemar Dickens, MD  [facility] 03/23/2016, 9:31 AM Pager   If 7PM-7AM, please contact night-coverage [email or web address] Password [email or web address]

## 2016-03-23 NOTE — ED Notes (Signed)
Admitting MD at bedside.

## 2016-03-23 NOTE — Progress Notes (Signed)
COURTESY NOTE:  61. [place] man with a history of myeloma followed closely at [facility] by Dr Blinda Leatherwood, presenting with influenza and severe anemia-- the patient was syncopal and EMS was called; they requested transport to [facility] but ambulance refused because (apparently) flu cases cannot be transported across county lines (?)--  The patient is admitted for transfusion and treatment of influenza. He would like to be treansferred to [facility] as soon as practicable and I believe that is in his best interest. I had written orders to work up his myeloma here but there is no need to do that if he will return to [facility] so am cancelling those orders. I have alerted Dr Alvy Bimler re admission--she consulted on this case in 10/2015--but, again, I am not sure a full consult will be needed if the plan is for transfer in next 24-48 hours.  Please let us know if we can be of further help

## 2016-03-24 LAB — TYPE AND SCREEN
ABO/RH(D): A NEG
Antibody Screen: NEGATIVE
UNIT DIVISION: 0
Unit division: 0

## 2016-03-25 MED FILL — Fentanyl Citrate Preservative Free (PF) Inj 100 MCG/2ML: INTRAMUSCULAR | Qty: 2 | Status: AC

## 2016-03-28 LAB — CULTURE, BLOOD (ROUTINE X 2)
Culture: NO GROWTH
Culture: NO GROWTH

## 2016-03-30 NOTE — Progress Notes (Deleted)
HPI: FU AVR. Previous AVR in 2006 with a porcine valve. Patient underwent redo aortic valve replacement with a pericardial tissue valve 12/06/11 with Dr. Roxy Manns. Abdominal ultrasound January 2017 showed no aneurysm. Patient has had problems with GI blood loss and is now being treated for multiple myeloma. He has required multiple blood transfusions. A TEE on was performed on 10/27/2015 to see if hemolysis from his valve may be contributing. This revealed normal LV systolic function. There was a bioprosthetic aortic valve with elevated mean gradient of 31 mmHg and moderate AI that was paravalvular. There was mild mitral regurgitation, mild biatrial enlargement and mild right ventricular enlargement. I reviewed the patient with Dr. Roxy Manns. He would be high risk for redo aortic valve replacement giving his comorbidities and this would be his third valve replacement. I discussed the patient with Dr. Norma Fredrickson at [facility] who is a member of the hematology oncology department. He felt anemia was multifactorial including hemolysis, myeloma and predominantly GI blood loss. He was being evaluated for GI source and if hemolysis thought to be the major issue in the future we could consider referral to Dr. Albertine Patricia for closure of his paravalvular leak. Last echocardiogram December 2017 showed normal LV function, grade 2 diastolic dysfunction, bioprosthetic aortic valve with moderate aortic stenosis and mild aortic insufficiency, biatrial enlargement and mild right ventricular enlargement. Mean gradient across aortic valve 30 mmHg. Patient recently had a syncopal episode and hemoglobin 4.7. GI evaluation showed possible AVMs on capsule endoscopy. Since last seen,   Current Outpatient Prescriptions  Medication Sig Dispense Refill  . acetaminophen (TYLENOL) 325 MG tablet Take 650 mg by mouth every 6 (six) hours as needed for mild pain.    Marland Kitchen acyclovir (ZOVIRAX) 800 MG tablet Take 0.5 tablets by mouth 2 (two) times  daily.    Marland Kitchen aspirin EC 81 MG tablet Take 81 mg by mouth daily.    . carbidopa-levodopa (SINEMET IR) 25-100 MG tablet Take 3 tablets by mouth 4 (four) times daily.     . cetirizine (ZYRTEC) 10 MG tablet Take 10 mg by mouth daily.    . cyclobenzaprine (FLEXERIL) 10 MG tablet Take 5 mg by mouth 3 (three) times daily as needed for muscle spasms.    Marland Kitchen dexamethasone (DECADRON) 4 MG tablet Take 20 mg by mouth every 7 (seven) days. days 1, 8, 15 and 22 of 28 day cycle.    . diazepam (VALIUM) 2 MG tablet Take 2 mg by mouth at bedtime as needed for anxiety.     . docusate sodium (COLACE) 100 MG capsule Take 100 mg by mouth 2 (two) times daily as needed for moderate constipation.     . finasteride (PROSCAR) 5 MG tablet Take 5 mg by mouth daily.    . fish oil-omega-3 fatty acids 1000 MG capsule Take 1 g by mouth daily.     . folic acid (FOLVITE) 1 MG tablet Take 1 mg by mouth daily.    . furosemide (LASIX) 20 MG tablet Take 20 mg by mouth daily as needed for edema.    . gabapentin (NEURONTIN) 800 MG tablet Take 400-800 mg by mouth See admin instructions. Take 1/2 tablet every morning and at bedtime then take 1 tablet at lunch    . HYDROcodone-acetaminophen (NORCO/VICODIN) 5-325 MG tablet Take 1 tablet by mouth every 6 (six) hours as needed for moderate pain.    . hydrocortisone (ANUSOL-HC) 2.5 % rectal cream Place 1 application rectally 2 (two) times daily.    Marland Kitchen  ketoconazole (NIZORAL) 2 % cream Apply 1 application topically daily as needed for irritation.    . metFORMIN (GLUCOPHAGE) 500 MG tablet Take 500 mg by mouth 2 (two) times daily with a meal.    . methylcellulose (ARTIFICIAL TEARS) 1 % ophthalmic solution Place 1 drop into both eyes at bedtime as needed (dry eyes).    . pantoprazole (PROTONIX) 40 MG tablet Take 1 tablet (40 mg total) by mouth daily. For GERD 30 tablet 3  . polycarbophil (FIBERCON) 625 MG tablet Take 2 tablets by mouth daily.    . pomalidomide (POMALYST) 4 MG capsule Take 4 mg by mouth  See admin instructions. Take with water on days 1-21 then  PATIENT IS OFF MED FOR 7 DAYS, Repeat every 28 days.    . pravastatin (PRAVACHOL) 40 MG tablet Take 1 tablet (40 mg total) by mouth every evening. (Patient not taking: Reported on 03/23/2016) 30 tablet 11  . senna-docusate (SENOKOT-S) 8.6-50 MG tablet Take 1 tablet by mouth daily.    . simethicone (MYLICON) 80 MG chewable tablet Chew 80 mg by mouth every 6 (six) hours as needed for flatulence.    . simvastatin (ZOCOR) 40 MG tablet Take 20 mg by mouth daily.    . traMADol (ULTRAM) 50 MG tablet Take 50 mg by mouth every 6 (six) hours as needed for moderate pain.    Marland Kitchen zinc oxide 20 % ointment Apply 1 application topically 2 (two) times daily as needed for irritation.     No current facility-administered medications for this visit.      Past Medical History:  Diagnosis Date  . Allergic rhinitis   . Anemia 2009  . Aortic stenosis    a. s/p porcine AVR 2006;  b. s/p redo tissue AVR 12/2011 (Dr. Roxy Manns) - preAVR LHC with no CAD  . Asthma   . BPH (benign prostatic hypertrophy)   . Cancer ([place])    multiple myeloma  . Cataract   . Chronic cough   . Chronic interstitial cystitis   . Depression    pt denies  . Diabetes mellitus    type 2  . Diabetes mellitus without complication ([place])   . Diverticulosis of colon    on colonoscopy 2008  . GERD (gastroesophageal reflux disease)    bravo pH study 2008  . Gout   . H/O aortic valve replacement with porcine valve    2006, 2013  . Headache(784.0)   . Hemorrhoids    external and internal  . Hiatal hernia   . HTN (hypertension)   . Hx of echocardiogram    a. Echo  (post AVR) 12/2011:  mod LVH, EF 60-65%, Gr 2 diast dysfn, mild AI, AVR ok (mean gradient 19 mmHg), MAC, mild BAE  . Hyperlipidemia   . Hypertension   . Hyperthyroidism   . IBS (irritable bowel syndrome)   . Insomnia   . Lower GI bleed 07/16/2015  . OA (osteoarthritis)   . OSA (obstructive sleep apnea)    USES CPAP AS  NEEDED  . 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 Periodontitis    chronic with bone loss  . Restless leg syndrome   . S/P aortic valve replacement with bioprosthetic valve 12/06/2011   Redo AVR using 23 mm [facility] Ease pericardial tissue valve    Past Surgical History:  Procedure Laterality Date  . AORTA - FEMORAL ARTERY BYPASS GRAFT    . AORTIC VALVE REPLACEMENT  10/13/2004   10m Edwards Perimount pericardial  tissue valve  . AORTIC VALVE REPLACEMENT  12/06/2011   Procedure: REDO AORTIC VALVE REPLACEMENT (AVR);  Surgeon: Rexene Alberts, MD;  Location: [place];  Service: Open Heart Surgery;  Laterality: N/A;  . BUNIONECTOMY     right  . CARDIAC SURGERY     aorta vavle replacement  . CATARACT EXTRACTION  2009    &   2012   BILATERAL  . COLONOSCOPY  08/31/2011   Procedure: COLONOSCOPY;  Surgeon: Inda Castle, MD;  Location: [place];  Service: Endoscopy;  Laterality: N/A;  . ESOPHAGOGASTRODUODENOSCOPY  08/30/2011   Procedure: ESOPHAGOGASTRODUODENOSCOPY (EGD);  Surgeon: Inda Castle, MD;  Location: [place];  Service: Endoscopy;  Laterality: N/A;  Rm 3005   . HERNIA REPAIR    . JOINT REPLACEMENT     knee  . LEFT HEART CATHETERIZATION WITH CORONARY ANGIOGRAM N/A 11/30/2011   Procedure: LEFT HEART CATHETERIZATION WITH CORONARY ANGIOGRAM;  Surgeon: Burnell Blanks, MD;  Location: [facility] CATH LAB;  Service: Cardiovascular;  Laterality: N/A;  . NASAL SEPTOPLASTY W/ TURBINOPLASTY    . REFRACTIVE SURGERY     bilateral  . RIGHT HEART CATHETERIZATION Bilateral 11/30/2011   Procedure: RIGHT HEART CATH;  Surgeon: Burnell Blanks, MD;  Location: [facility] CATH LAB;  Service: Cardiovascular;  Laterality: Bilateral;  . right knee arthroscopy    . TEE WITHOUT CARDIOVERSION N/A 10/27/2015   Procedure: TRANSESOPHAGEAL ECHOCARDIOGRAM (TEE);  Surgeon: Lelon Perla, MD;  Location: [facility] ENDOSCOPY;  Service: Cardiovascular;  Laterality: N/A;    Social History     Social History  . Marital status: Married    Spouse name: N/A  . Number of children: 0  . Years of education: N/A   Occupational History  . retired    Social History Main Topics  . Smoking status: Former Smoker    Quit date: 09/30/1971  . Smokeless tobacco: Never Used     Comment: Quit August 973  . Alcohol use No  . Drug use: No  . Sexual activity: Not Currently   Other Topics Concern  . Not on file   Social History Narrative   ** Merged History Encounter **        Family History  Problem Relation Age of Onset  . Heart disease Father   . Osteoarthritis Mother   . Hypertension Sister   . Hyperlipidemia    . Cancer Maternal Aunt     ROS: no fevers or chills, productive cough, hemoptysis, dysphasia, odynophagia, melena, hematochezia, dysuria, hematuria, rash, seizure activity, orthopnea, PND, pedal edema, claudication. Remaining systems are negative.  Physical Exam: Well-developed well-nourished in no acute distress.  Skin is warm and dry.  HEENT is normal.  Neck is supple.  Chest is clear to auscultation with normal expansion.  Cardiovascular exam is regular rate and rhythm.  Abdominal exam nontender or distended. No masses palpated. Extremities show no edema. neuro grossly intact  ECG

## 2016-04-04 ENCOUNTER — Ambulatory Visit: Payer: Medicare Other | Admitting: Cardiology

## 2016-04-18 NOTE — Progress Notes (Signed)
HPI: FU AVR. Previous AVR in 2006 with a porcine valve. Echocardiogram 11/29/11: Moderate LVH, EF 16-60%, grade 1 diastolic dysfunction, critical aortic stenosis with a mean gradient of 80 mmHg. LHC 11/28/11: Normal coronary arteries, severe aortic stenosis. Patient underwent redo aortic valve replacement with a pericardial tissue valve 12/06/11 with Dr. Roxy Manns. Abdominal ultrasound January 2017 showed no aneurysm. Patient has had problems with GI blood loss and is being treated for multiple myeloma. He has required multiple blood transfusions. A TEE was performed on 10/27/2015 to see if hemolysis from his valve may be contributing. This revealed normal LV systolic function. There was a bioprosthetic aortic valve with elevated mean gradient of 31 mmHg and moderate AI that was paravalvular. There was mild mitral regurgitation, mild biatrial enlargement and mild right ventricular enlargement. I reviewed the patient with Dr. Roxy Manns. He would be high risk for redo aortic valve replacement giving his comorbidities and this would be his third valve replacement. I discussed the patient with Dr. Norma Fredrickson at [facility] who is a member of the hematology oncology department. He felt anemia was multifactorial including hemolysis, myeloma and predominantly GI blood loss. He was being evaluated for GI source and if hemolysis thought to be the major issue in the future we could consider referral to Dr. Albertine Patricia in [place] for closure of his paravalvular leak. At last OV pt's Hgb had improved and he had not required recent transfusion and we elected to follow. 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 Last echo 12/17 showed normal LV function; grade 2 diastolic dysfunction, s/p AVR with moderate AS and mild AI, biatrial enlargement. Had syncope 2/18 felt related to sepsis and anemia (Hgb 4.7). Transferred to [facility] for oncology admission. Since last seen, he has some dyspnea on exertion  but no orthopnea or PND. He has had pedal edema. No chest pain or syncope.  Current Outpatient Prescriptions  Medication Sig Dispense Refill  . acetaminophen (TYLENOL) 325 MG tablet Take 650 mg by mouth every 6 (six) hours as needed for mild pain.    Marland Kitchen acyclovir (ZOVIRAX) 800 MG tablet Take 0.5 tablets by mouth 2 (two) times daily.    Marland Kitchen aspirin EC 81 MG tablet Take 81 mg by mouth daily.    . carbidopa-levodopa (SINEMET IR) 25-100 MG tablet Take 3 tablets by mouth 4 (four) times daily.     . cetirizine (ZYRTEC) 10 MG tablet Take 10 mg by mouth daily.    . cyclobenzaprine (FLEXERIL) 10 MG tablet Take 5 mg by mouth 3 (three) times daily as needed for muscle spasms.    Marland Kitchen dexamethasone (DECADRON) 4 MG tablet Take 20 mg by mouth every 7 (seven) days. days 1, 8, 15 and 22 of 28 day cycle.    . diazepam (VALIUM) 2 MG tablet Take 2 mg by mouth at bedtime as needed for anxiety.     . docusate sodium (COLACE) 100 MG capsule Take 100 mg by mouth 2 (two) times daily as needed for moderate constipation.     . finasteride (PROSCAR) 5 MG tablet Take 5 mg by mouth daily.    . fish oil-omega-3 fatty acids 1000 MG capsule Take 1 g by mouth daily.     . folic acid (FOLVITE) 1 MG tablet Take 1 mg by mouth daily.    . furosemide (LASIX) 20 MG tablet Take 20 mg by mouth daily as needed for edema.    . gabapentin (NEURONTIN) 800 MG tablet Take  400-800 mg by mouth See admin instructions. Take 1/2 tablet every morning and at bedtime then take 1 tablet at lunch    . HYDROcodone-acetaminophen (NORCO/VICODIN) 5-325 MG tablet Take 1 tablet by mouth every 6 (six) hours as needed for moderate pain.    . hydrocortisone (ANUSOL-HC) 2.5 % rectal cream Place 1 application rectally 2 (two) times daily.    Marland Kitchen ketoconazole (NIZORAL) 2 % cream Apply 1 application topically daily as needed for irritation.    . metFORMIN (GLUCOPHAGE) 500 MG tablet Take 500 mg by mouth 2 (two) times daily with a meal.    . methylcellulose (ARTIFICIAL  TEARS) 1 % ophthalmic solution Place 1 drop into both eyes at bedtime as needed (dry eyes).    . pantoprazole (PROTONIX) 40 MG tablet Take 1 tablet (40 mg total) by mouth daily. For GERD 30 tablet 3  . polycarbophil (FIBERCON) 625 MG tablet Take 2 tablets by mouth daily.    . pomalidomide (POMALYST) 4 MG capsule Take 4 mg by mouth See admin instructions. Take with water on days 1-21 then  PATIENT IS OFF MED FOR 7 DAYS, Repeat every 28 days.    . pravastatin (PRAVACHOL) 40 MG tablet Take 1 tablet (40 mg total) by mouth every evening. (Patient not taking: Reported on 03/23/2016) 30 tablet 11  . senna-docusate (SENOKOT-S) 8.6-50 MG tablet Take 1 tablet by mouth daily.    . simethicone (MYLICON) 80 MG chewable tablet Chew 80 mg by mouth every 6 (six) hours as needed for flatulence.    . simvastatin (ZOCOR) 40 MG tablet Take 20 mg by mouth daily.    . traMADol (ULTRAM) 50 MG tablet Take 50 mg by mouth every 6 (six) hours as needed for moderate pain.    Marland Kitchen zinc oxide 20 % ointment Apply 1 application topically 2 (two) times daily as needed for irritation.     No current facility-administered medications for this visit.      Past Medical History:  Diagnosis Date  . Allergic rhinitis   . Anemia 2009  . Aortic stenosis    a. s/p porcine AVR 2006;  b. s/p redo tissue AVR 12/2011 (Dr. Roxy Manns) - preAVR LHC with no CAD  . Asthma   . BPH (benign prostatic hypertrophy)   . Cancer ([place])    multiple myeloma  . Cataract   . Chronic cough   . Chronic interstitial cystitis   . Depression    pt denies  . Diabetes mellitus    type 2  . Diabetes mellitus without complication ([place])   . Diverticulosis of colon    on colonoscopy 2008  . GERD (gastroesophageal reflux disease)    bravo pH study 2008  . Gout   . H/O aortic valve replacement with porcine valve    2006, 2013  . Headache(784.0)   . Hemorrhoids    external and internal  . Hiatal hernia   . HTN (hypertension)   . Hx of echocardiogram    a.  Echo  (post AVR) 12/2011:  mod LVH, EF 60-65%, Gr 2 diast dysfn, mild AI, AVR ok (mean gradient 19 mmHg), MAC, mild BAE  . Hyperlipidemia   . Hypertension   . Hyperthyroidism   . IBS (irritable bowel syndrome)   . Insomnia   . Lower GI bleed 07/16/2015  . OA (osteoarthritis)   . OSA (obstructive sleep apnea)    USES CPAP AS NEEDED  . 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 Periodontitis  chronic with bone loss  . Restless leg syndrome   . S/P aortic valve replacement with bioprosthetic valve 12/06/2011   Redo AVR using 23 mm [facility] Ease pericardial tissue valve    Past Surgical History:  Procedure Laterality Date  . AORTA - FEMORAL ARTERY BYPASS GRAFT    . AORTIC VALVE REPLACEMENT  10/13/2004   31m [place]imount pericardial tissue valve  . AORTIC VALVE REPLACEMENT  12/06/2011   Procedure: REDO AORTIC VALVE REPLACEMENT (AVR);  Surgeon: CRexene Alberts MD;  [place]  Service: Open Heart Surgery;  Laterality: N/A;  . BUNIONECTOMY     right  . CARDIAC SURGERY     aorta vavle replacement  . CATARACT EXTRACTION  2009    &   2012   BILATERAL  . COLONOSCOPY  08/31/2011   Procedure: COLONOSCOPY;  Surgeon: RInda Castle MD;  [place] [facility]  Service: Endoscopy;  Laterality: N/A;  . ESOPHAGOGASTRODUODENOSCOPY  08/30/2011   Procedure: ESOPHAGOGASTRODUODENOSCOPY (EGD);  Surgeon: RInda Castle MD;  [place] [facility]  Service: Endoscopy;  Laterality: N/A;  Rm 3005   . HERNIA REPAIR    . JOINT REPLACEMENT     knee  . LEFT HEART CATHETERIZATION WITH CORONARY ANGIOGRAM N/A 11/30/2011   Procedure: LEFT HEART CATHETERIZATION WITH CORONARY ANGIOGRAM;  Surgeon: CBurnell Blanks MD;  [facility];  Service: Cardiovascular;  Laterality: N/A;  . NASAL SEPTOPLASTY W/ TURBINOPLASTY    . REFRACTIVE SURGERY     bilateral  . RIGHT HEART CATHETERIZATION Bilateral 11/30/2011   Procedure: RIGHT HEART CATH;  Surgeon: CBurnell Blanks MD;  [facility];  Service: Cardiovascular;  Laterality: Bilateral;  . right knee arthroscopy    . TEE WITHOUT CARDIOVERSION N/A 10/27/2015   Procedure: TRANSESOPHAGEAL ECHOCARDIOGRAM (TEE);  Surgeon: BLelon Perla MD;  [facility];  Service: Cardiovascular;  Laterality: N/A;    Social History   Social History  . Marital status: Married    Spouse name: N/A  . Number of children: 0  . Years of education: N/A   Occupational History  . retired    Social History Main Topics  . Smoking status: Former Smoker    Quit date: 09/30/1971  . Smokeless tobacco: Never Used     Comment: Quit August 973  . Alcohol use No  . Drug use: No  . Sexual activity: Not Currently   Other Topics Concern  . Not on file   Social History Narrative   ** Merged History Encounter **        Family History  Problem Relation Age of Onset  . Heart disease Father   . Osteoarthritis Mother   . Hypertension Sister   . Hyperlipidemia    . Cancer Maternal Aunt     ROS: fatigue but no fevers or chills, productive cough, hemoptysis, dysphasia, odynophagia, melena, hematochezia, dysuria, hematuria, rash, seizure activity, orthopnea, PND, claudication. Remaining systems are negative.  Physical Exam: Well-developed chronically ill appearing in no acute distress.  Skin is warm and dry.  HEENT is normal.  Neck is supple.  Chest is clear to auscultation with normal expansion.  Cardiovascular exam is regular rate and rhythm. 3/6 systolic murmur Abdominal exam nontender or distended. No masses palpated. Extremities show 1+ ankle edema. neuro grossly intact  A/P  1 Status post aortic valve replacement-continue SBE prophylaxis. He will need follow-up echoes in the future. Previous transesophageal echocardiogram did show moderate paravalvular aortic insufficiency which we felt could  be contributing to anemia by causing hemolysis. However his hematologist feels it is predominantly  blood loss anemia from his GI tract. 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 He will continue to follow-up with hematology and be transfused as needed. We could consider referral for closure of his paravalvular leak if hemolysis is felt to be the predominant cause in the future.  2 hypertension-blood pressure controlled. Continue present medications.  3 hyperlipidemia-continue statin.  4 anemia-this is felt to be multifactorial including GI blood loss, myeloma with possible contribution from hemolysis from aortic valve. See above.  5 multiple myeloma-management per oncology.  6 history of cough syncope-no recent episodes.     Kirk Ruths, MD

## 2016-04-21 ENCOUNTER — Telehealth: Payer: Self-pay | Admitting: Cardiology

## 2016-04-21 ENCOUNTER — Encounter: Payer: Self-pay | Admitting: Cardiology

## 2016-04-21 NOTE — Telephone Encounter (Signed)
Received records from [facility] for appointment on 05/02/16 with Dr Stanford Breed.  Records put with Dr Jacalyn Lefevre schedule for 05/02/16. lp

## 2016-05-02 ENCOUNTER — Encounter: Payer: Self-pay | Admitting: Cardiology

## 2016-05-02 ENCOUNTER — Ambulatory Visit (INDEPENDENT_AMBULATORY_CARE_PROVIDER_SITE_OTHER): Payer: Medicare Other | Admitting: Cardiology

## 2016-05-02 VITALS — BP 118/68 | HR 66 | Ht 69.0 in | Wt 189.0 lb

## 2016-05-02 DIAGNOSIS — Z952 Presence of prosthetic heart valve: Secondary | ICD-10-CM | POA: Diagnosis not present

## 2016-05-02 DIAGNOSIS — I351 Nonrheumatic aortic (valve) insufficiency: Secondary | ICD-10-CM | POA: Diagnosis not present

## 2016-05-02 DIAGNOSIS — I10 Essential (primary) hypertension: Secondary | ICD-10-CM | POA: Diagnosis not present

## 2016-05-02 DIAGNOSIS — E7800 Pure hypercholesterolemia, unspecified: Secondary | ICD-10-CM | POA: Diagnosis not present

## 2016-05-02 NOTE — Patient Instructions (Signed)
Your physician wants you to follow-up in: 4 MONTHS WITH DR CRENSHAW You will receive a reminder letter in the mail two months in advance. If you don't receive a letter, please call our office to schedule the follow-up appointment.   If you need a refill on your cardiac medications before your next appointment, please call your pharmacy.  

## 2016-06-21 ENCOUNTER — Telehealth: Payer: Self-pay | Admitting: Physician Assistant

## 2016-06-23 ENCOUNTER — Ambulatory Visit: Payer: Medicare Other | Admitting: Nurse Practitioner

## 2016-06-23 NOTE — Telephone Encounter (Signed)
close encounter °

## 2016-06-24 ENCOUNTER — Ambulatory Visit: Payer: Medicare Other | Admitting: Physician Assistant

## 2016-07-06 NOTE — Progress Notes (Signed)
Cardiology Office Note    Date:  07/07/2016   ID:  [name], DOB October 28, 1945, MRN [id number]  PCP:  Buzzy Han, MD  Cardiologist: Dr. Stanford Breed   Chief Complaint  Patient presents with  . Follow-up    edema, fatigue    History of Present Illness:    [name] is a 71 y.o. male with past medical history of severe AS (s/p AVR with porcine valve in 2006, re-do tissue valve in 12/2011), normal cors by cath in 2013, HTN, Type 2 DM, and multiple myeloma who presents to the office today for follow-up.   He was examined by Dr. Stanford Breed in 04/2016 and reported doing well from a cardiac perspective at that time, denying any recent chest pain or dyspnea on exertion. He had experienced a syncopal event in 03/2016 in the setting of sepsis and anemia (Hgb at 4.7). Dr. Stanford Breed had reviewed the patient with Dr. Norma Fredrickson from [facility] who felt his anemia was multifactorial in the setting of hemolysis, myeloma and predominantly GI blood loss. There was mention of possible referral to Dr. Albertine Patricia in [place] for closure of his paravalvular leak if anemia was thought to be secondary to hemolysis.Further GI workup revealed multiple AVM's and these were thought to be the cause of his anemia.   He was evaluated by Oncology on 5/11 and reported worsening dyspnea along with lower extremity edema. Required administration of IV Lasix prior to his blood transfusion. He received a blood transfusion on 5/30 and was noted to have an irregular HR. An EKG was not performed at that time.   In talking with the patient, he reports having headaches at times which he equates to his sinuses. He has baseline fatigue which is thought to be secondary to his treatments, anemia, and poor sleep habits (only sleeps 4-5 hours per night). He denies any recent chest pain or dyspnea on exertion. No palpitations, lightheadedness, dizziness, or presyncope. Has noticed worsening lower extremity edema over the  past month with swelling extending to his upper thighs. Denies any scrotal edema. No orthopnea or PND.    Past Medical History:  Diagnosis Date  . Allergic rhinitis   . Anemia 2009  . Aortic stenosis    a. s/p porcine AVR 2006;  b. s/p redo tissue AVR 12/2011 (Dr. Roxy Manns) - preAVR LHC with no CAD  . Asthma   . BPH (benign prostatic hypertrophy)   . Cancer ([place])    multiple myeloma  . Cataract   . Chronic cough   . Chronic interstitial cystitis   . Depression    pt denies  . Diabetes mellitus    type 2  . Diabetes mellitus without complication ([place])   . Diverticulosis of colon    on colonoscopy 2008  . GERD (gastroesophageal reflux disease)    bravo pH study 2008  . Gout   . H/O aortic valve replacement with porcine valve    2006, 2013  . Headache(784.0)   . Hemorrhoids    external and internal  . Hiatal hernia   . HTN (hypertension)   . Hx of echocardiogram    a. Echo  (post AVR) 12/2011:  mod LVH, EF 60-65%, Gr 2 diast dysfn, mild AI, AVR ok (mean gradient 19 mmHg), MAC, mild BAE  . Hyperlipidemia   . Hypertension   . Hyperthyroidism   . IBS (irritable bowel syndrome)   . Insomnia   . Lower GI bleed 07/16/2015  . OA (osteoarthritis)   .  OSA (obstructive sleep apnea)    USES CPAP AS NEEDED  . 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 Periodontitis    chronic with bone loss  . Restless leg syndrome   . S/P aortic valve replacement with bioprosthetic valve 12/06/2011   Redo AVR using 23 mm [facility] Ease pericardial tissue valve    Past Surgical History:  Procedure Laterality Date  . AORTA - FEMORAL ARTERY BYPASS GRAFT    . AORTIC VALVE REPLACEMENT  10/13/2004   3m [place] pericardial tissue valve  . AORTIC VALVE REPLACEMENT  12/06/2011   Procedure: REDO AORTIC VALVE REPLACEMENT (AVR);  Surgeon: CRexene Alberts MD;  [facility]  Service: Open Heart Surgery;  Laterality: N/A;  . BUNIONECTOMY     right  . CARDIAC SURGERY      aorta vavle replacement  . CATARACT EXTRACTION  2009    &   2012   BILATERAL  . COLONOSCOPY  08/31/2011   Procedure: COLONOSCOPY;  Surgeon: RInda Castle MD;  [place] [facility]  Service: Endoscopy;  Laterality: N/A;  . ESOPHAGOGASTRODUODENOSCOPY  08/30/2011   Procedure: ESOPHAGOGASTRODUODENOSCOPY (EGD);  Surgeon: RInda Castle MD;  [place] [facility]  Service: Endoscopy;  Laterality: N/A;  Rm 3005   . HERNIA REPAIR    . JOINT REPLACEMENT     knee  . LEFT HEART CATHETERIZATION WITH CORONARY ANGIOGRAM N/A 11/30/2011   Procedure: LEFT HEART CATHETERIZATION WITH CORONARY ANGIOGRAM;  Surgeon: CBurnell Blanks MD;  [facility];  Service: Cardiovascular;  Laterality: N/A;  . NASAL SEPTOPLASTY W/ TURBINOPLASTY    . REFRACTIVE SURGERY     bilateral  . RIGHT HEART CATHETERIZATION Bilateral 11/30/2011   Procedure: RIGHT HEART CATH;  Surgeon: CBurnell Blanks MD;  [facility];  Service: Cardiovascular;  Laterality: Bilateral;  . right knee arthroscopy    . TEE WITHOUT CARDIOVERSION N/A 10/27/2015   Procedure: TRANSESOPHAGEAL ECHOCARDIOGRAM (TEE);  Surgeon: BLelon Perla MD;  [facility];  Service: Cardiovascular;  Laterality: N/A;    Current Medications: Outpatient Medications Prior to Visit  Medication Sig Dispense Refill  . acetaminophen (TYLENOL) 325 MG tablet Take 650 mg by mouth every 6 (six) hours as needed for mild pain.    .Marland Kitchenacyclovir (ZOVIRAX) 800 MG tablet Take 0.5 tablets by mouth 2 (two) times daily.    . carbidopa-levodopa (SINEMET IR) 25-100 MG tablet Take 3 tablets by mouth 4 (four) times daily.     . cetirizine (ZYRTEC) 10 MG tablet Take 10 mg by mouth daily.    . cyclobenzaprine (FLEXERIL) 10 MG tablet Take 5 mg by mouth 3 (three) times daily as needed for muscle spasms.    .Marland Kitchendexamethasone (DECADRON) 4 MG tablet Take 10 mg by mouth every 7 (seven) days. days 1, 8, 15 and 22 of 28 day cycle.    . diazepam (VALIUM) 2 MG  tablet Take 2 mg by mouth at bedtime as needed for anxiety.     . docusate sodium (COLACE) 100 MG capsule Take 100 mg by mouth 2 (two) times daily as needed for moderate constipation.     . finasteride (PROSCAR) 5 MG tablet Take 5 mg by mouth daily.    . fish oil-omega-3 fatty acids 1000 MG capsule Take 1 g by mouth daily.     . folic acid (FOLVITE) 1 MG tablet Take 1 mg by mouth daily.    .Marland Kitchengabapentin (NEURONTIN) 800 MG tablet Take 800 mg  by mouth 4 (four) times daily. Take 1/2 tablet every morning and at bedtime then take 1 tablet at lunch    . HYDROcodone-acetaminophen (NORCO/VICODIN) 5-325 MG tablet Take 1 tablet by mouth every 6 (six) hours as needed for moderate pain.    . hydrocortisone (ANUSOL-HC) 2.5 % rectal cream Place 1 application rectally 2 (two) times daily.    Marland Kitchen ketoconazole (NIZORAL) 2 % cream Apply 1 application topically daily as needed for irritation.    . metFORMIN (GLUCOPHAGE) 500 MG tablet Take 500 mg by mouth 2 (two) times daily with a meal.    . methylcellulose (ARTIFICIAL TEARS) 1 % ophthalmic solution Place 1 drop into both eyes at bedtime as needed (dry eyes).    . pantoprazole (PROTONIX) 40 MG tablet Take 1 tablet (40 mg total) by mouth daily. For GERD 30 tablet 3  . polycarbophil (FIBERCON) 625 MG tablet Take 2 tablets by mouth daily.    . pomalidomide (POMALYST) 4 MG capsule Take 4 mg by mouth See admin instructions. Take with water on days 1-21 then  PATIENT IS OFF MED FOR 7 DAYS, Repeat every 28 days.    . pravastatin (PRAVACHOL) 40 MG tablet Take 1 tablet (40 mg total) by mouth every evening. 30 tablet 11  . senna-docusate (SENOKOT-S) 8.6-50 MG tablet Take 1 tablet by mouth daily.    . simethicone (MYLICON) 80 MG chewable tablet Chew 80 mg by mouth every 6 (six) hours as needed for flatulence.    . simvastatin (ZOCOR) 40 MG tablet Take 20 mg by mouth daily.    . traMADol (ULTRAM) 50 MG tablet Take 50 mg by mouth every 6 (six) hours as needed for moderate pain.      Marland Kitchen zinc oxide 20 % ointment Apply 1 application topically 2 (two) times daily as needed for irritation.    . furosemide (LASIX) 20 MG tablet Take 20 mg by mouth daily as needed for edema.     No facility-administered medications prior to visit.      Allergies:   Avodart [dutasteride]; Spironolactone; Codeine; Diazepam; Doxazosin; and Feraheme [ferumoxytol]   Social History   Social History  . Marital status: Married    Spouse name: N/A  . Number of children: 0  . Years of education: N/A   Occupational History  . retired    Social History Main Topics  . Smoking status: Former Smoker    Quit date: 09/30/1971  . Smokeless tobacco: Never Used     Comment: Quit August 973  . Alcohol use No  . Drug use: No  . Sexual activity: Not Currently   Other Topics Concern  . None   Social History Narrative   ** Merged History Encounter **         Family History:  The patient's family history includes Cancer in his maternal aunt; Heart disease in his father; Hypertension in his sister; Osteoarthritis in his mother.   Review of Systems:   Please see the history of present illness.     General:  No chills, fever, night sweats or weight changes.  Cardiovascular:  No chest pain, orthopnea, palpitations, paroxysmal nocturnal dyspnea. Positive for dyspnea on exertion and lower extremity edema.  Dermatological: No rash, lesions/masses Respiratory: No cough, dyspnea Urologic: No hematuria, dysuria Abdominal:   No nausea, vomiting, diarrhea, bright red blood per rectum, melena, or hematemesis Neurologic:  No visual changes, wkns, changes in mental status. All other systems reviewed and are otherwise negative except as noted above.  Physical Exam:    VS:  BP 126/70   Pulse 88   Ht 5' 9"  (1.753 m)   Wt 197 lb (89.4 kg)   BMI 29.09 kg/m    General: Well developed, well nourished Caucasian male appearing in no acute distress. Head: Normocephalic, atraumatic, sclera non-icteric, no  xanthomas, nares are without discharge.  Neck: No carotid bruits. JVD not elevated.  Lungs: Respirations regular and unlabored, without wheezes or rales.  Heart: Irregularly irregular. No S3 or S4.  No murmur, no rubs, or gallops appreciated. Abdomen: Soft, non-tender, non-distended with normoactive bowel sounds. No hepatomegaly. No rebound/guarding. No obvious abdominal masses. Msk:  Strength and tone appear normal for age. No joint deformities or effusions. Extremities: No clubbing or cyanosis. 1+ pitting edema up to thighs bilaterally.  Distal pedal pulses are 2+ bilaterally. Neuro: Alert and oriented X 3. Moves all extremities spontaneously. No focal deficits noted. Psych:  Responds to questions appropriately with a normal affect. Skin: No rashes or lesions noted  Wt Readings from Last 3 Encounters:  07/07/16 197 lb (89.4 kg)  05/02/16 189 lb (85.7 kg)  03/23/16 180 lb (81.6 kg)    Studies/Labs Reviewed:   EKG:  EKG is ordered today.  The ekg ordered today demonstrates signigficant baseline artifact but read as showing atrial flutter with PVC's. HR 94.   Recent Labs: 03/23/2016: ALT 17; BUN 14; Creatinine, Ser 0.67; Hemoglobin 4.7; Platelets 145; Potassium 4.2; Sodium 132   Lipid Panel    Component Value Date/Time   CHOL 133 02/12/2015 0829   TRIG 293 (H) 02/12/2015 0829   HDL 38 (L) 02/12/2015 0829   CHOLHDL 3.5 02/12/2015 0829   VLDL 59 (H) 02/12/2015 0829   LDLCALC 36 02/12/2015 0829    Additional studies/ records that were reviewed today include:   Echocardiogram: 01/2016 Study Conclusions  - Left ventricle: The cavity size was normal. Wall thickness was   increased in a pattern of moderate LVH. There was mild focal   basal hypertrophy of the septum. Systolic function was vigorous.   The estimated ejection fraction was in the range of 65% to 70%.   Wall motion was normal; there were no regional wall motion   abnormalities. Features are consistent with a  pseudonormal left   ventricular filling pattern, with concomitant abnormal relaxation   and increased filling pressure (grade 2 diastolic dysfunction).   Doppler parameters are consistent with high ventricular filling   pressure. - Aortic valve: A bioprosthesis was present. There was moderate   stenosis. There was mild regurgitation. - Mitral valve: Calcified annulus. - Left atrium: The atrium was severely dilated. - Right ventricle: The cavity size was mildly dilated. - Right atrium: The atrium was mildly dilated. - Pulmonary arteries: Systolic pressure was mildly to moderately   increased.  Impressions:  - Normal LV systolic function; grade 2 diastolic dysfunction with   elevated LV filling pressure; moderate LVH; bioprosthetic aortic   valve with elevated mean gradient (30 mmHg); mild AI; severe LAE;   mild RAE and RVE; mild TR with mild to moderate elevation in   pulmonary pressure.  Assessment:    1. Acute on chronic diastolic congestive heart failure ([place])   2. New onset atrial flutter ([place])   3. S/P AVR (aortic valve replacement)   4. Anemia, unspecified type      Plan:   In order of problems listed above:  1. Acute on Chronic Diastolic CHF - echo in 00/1749 showed a preserved EF of 65-70%  with Grade 2 DD.  - he has 1+ pitting edema up to his thighs bilaterally but thankfully lungs are clear on examination. - will increase his Lasix from 32m daily to 455mdaily. He is also receiving IV Lasix at the time of his transfusions (should hold PO Lasix when this is administered). He strongly wishes to avoid taking K+ supplementation due to the size of the pills. Will provide the patient with a list of foods that are high in K+. He will need a repeat BMET in 1 week. If K+ is low, would start K-dur 20 mEq daily.   2. New Onset Atrial Flutter - reports being told his HR was irregular during his transfusion yesterday. - he has a known history of PAC's and PVC's but on his EKG  today, it seems most consistent with atrial flutter granted baseline artifact is noted.  - This patients CHA2DS2-VASc Score and unadjusted Ischemic Stroke Rate (% per year) is equal to 2.2 % stroke rate/year from a score of 2 (DM, Age). With his significant anemia and requirement for blood transfusions, he is not a candidate for anticoagulation at this time. I thoroughly reviewed this with the patient and his wife.  - he is likely to go back into NSR when the underlying etiology is corrected (likely his anemia). HR is well-controlled.   3. Severe AS - s/p AVR with porcine valve in 2006, re-do tissue valve in 12/2011.  - moderate stenosis noted on his recent echo in 01/2016.  4. Anemia - his anemia is felt to be multifactorial in the setting of hemolysis, myeloma and predominantly GI blood loss. Recent GI workup revealed multiple AVM's and he is being followed closely by GI for this.  - Hgb at 6.4 when checked yesterday pre-transfusion. Scheduled for another transfusion and repeat CBC tomorrow.   Medication Adjustments/Labs and Tests Ordered: Current medicines are reviewed at length with the patient today.  Concerns regarding medicines are outlined above.  Medication changes, Labs and Tests ordered today are listed in the Patient Instructions below. Patient Instructions  Medication Instructions:  INCREASE LASIX TO 40MG DAILY  If you need a refill on your cardiac medications before your next appointment, please call your pharmacy.   Follow-Up: Your physician wants you to follow-up in: [address] [place]  Special Instructions: INCREASE POTASSIUM INTAKE BY EATING POTASSIUM CONTAINING FOODS  [facility]  Signed, BrErma HeritagePA-C  07/07/2016 9:07 PM    [facility] [address]Su[address]Ethel[place]  [id number]: [phone]: [phone] [address]Su[address]Marvell[place]  [id number]: [phone]

## 2016-07-07 ENCOUNTER — Encounter: Payer: Self-pay | Admitting: Student

## 2016-07-07 ENCOUNTER — Ambulatory Visit (INDEPENDENT_AMBULATORY_CARE_PROVIDER_SITE_OTHER): Payer: Medicare Other | Admitting: Student

## 2016-07-07 VITALS — BP 126/70 | HR 88 | Ht 69.0 in | Wt 197.0 lb

## 2016-07-07 DIAGNOSIS — I4892 Unspecified atrial flutter: Secondary | ICD-10-CM | POA: Diagnosis not present

## 2016-07-07 DIAGNOSIS — Z952 Presence of prosthetic heart valve: Secondary | ICD-10-CM

## 2016-07-07 DIAGNOSIS — D649 Anemia, unspecified: Secondary | ICD-10-CM | POA: Diagnosis not present

## 2016-07-07 DIAGNOSIS — I5033 Acute on chronic diastolic (congestive) heart failure: Secondary | ICD-10-CM | POA: Diagnosis not present

## 2016-07-07 MED ORDER — FUROSEMIDE 40 MG PO TABS: 40.0000 mg | ORAL_TABLET | Freq: Every day | ORAL | 5 refills | Status: DC

## 2016-07-07 MED ORDER — FUROSEMIDE 40 MG PO TABS
40.0000 mg | ORAL_TABLET | Freq: Every day | ORAL | 3 refills | Status: DC
Start: 2016-07-07 — End: 2016-08-15

## 2016-07-07 NOTE — Patient Instructions (Signed)
Medication Instructions:  INCREASE LASIX TO 40MG  DAILY  If you need a refill on your cardiac medications before your next appointment, please call your pharmacy.   Follow-Up: Your physician wants you to follow-up in: [place].   Special Instructions: INCREASE POTASSIUM INTAKE BY EATING POTASSIUM CONTAINING FOODS    Thank you for choosing [facility] at [facility]!!    Potassium Content of Foods Potassium is a mineral found in many foods and drinks. It helps keep fluids and minerals balanced in your body and affects how steadily your heart beats. Potassium also helps control your blood pressure and keep your muscles and nervous system healthy. Certain health conditions and medicines may change the balance of potassium in your body. When this happens, you can help balance your level of potassium through the foods that you do or do not eat. Your health care provider or dietitian may recommend an amount of potassium that you should have each day. The following lists of foods provide the amount of potassium (in parentheses) per serving in each item. High in potassium The following foods and beverages have 200 mg or more of potassium per serving:  Apricots, 2 raw or 5 dry (200 mg).  Artichoke, 1 medium (345 mg).  Avocado, raw,  each (245 mg).  Banana, 1 medium (425 mg).  Beans, lima, or baked beans, canned,  cup (280 mg).  Beans, white, canned,  cup (595 mg).  Beef roast, 3 oz (320 mg).  Beef, ground, 3 oz (270 mg).  Beets, raw or cooked,  cup (260 mg).  Bran muffin, 2 oz (300 mg).  Broccoli,  cup (230 mg).  Brussels sprouts,  cup (250 mg).  Cantaloupe,  cup (215 mg).  Cereal, 100% bran,  cup (200-400 mg).  Cheeseburger, single, fast food, 1 each (225-400 mg).  Chicken, 3 oz (220 mg).  Clams, canned, 3 oz (535 mg).  Crab, 3 oz (225 mg).  Dates, 5 each (270 mg).  Dried beans and peas,  cup (300-475 mg).  Figs, dried, 2 each (260  mg).  Fish: halibut, tuna, cod, snapper, 3 oz (480 mg).  Fish: salmon, haddock, swordfish, perch, 3 oz (300 mg).  Fish, tuna, canned 3 oz (200 mg).  [place] fries, fast food, 3 oz (470 mg).  Granola with fruit and nuts,  cup (200 mg).  Grapefruit juice,  cup (200 mg).  Greens, beet,  cup (655 mg).  Honeydew melon,  cup (200 mg).  Kale, raw, 1 cup (300 mg).  Kiwi, 1 medium (240 mg).  Kohlrabi, rutabaga, parsnips,  cup (280 mg).  Lentils,  cup (365 mg).  Mango, 1 each (325 mg).  Milk, chocolate, 1 cup (420 mg).  Milk: nonfat, low-fat, whole, buttermilk, 1 cup (350-380 mg).  Molasses, 1 Tbsp (295 mg).  Mushrooms,  cup (280) mg.  Nectarine, 1 each (275 mg).  Nuts: almonds, peanuts, hazelnuts, [place], cashew, mixed, 1 oz (200 mg).  Nuts, pistachios, 1 oz (295 mg).  Orange, 1 each (240 mg).  Orange juice,  cup (235 mg).  Papaya, medium,  fruit (390 mg).  Peanut butter, chunky, 2 Tbsp (240 mg).  Peanut butter, smooth, 2 Tbsp (210 mg).  Pear, 1 medium (200 mg).  Pomegranate, 1 whole (400 mg).  Pomegranate juice,  cup (215 mg).  Pork, 3 oz (350 mg).  Potato chips, salted, 1 oz (465 mg).  Potato, baked with skin, 1 medium (925 mg).  Potatoes, boiled,  cup (255 mg).  Potatoes, mashed,  cup (  330 mg).  Prune juice,  cup (370 mg).  Prunes, 5 each (305 mg).  Pudding, chocolate,  cup (230 mg).  Pumpkin, canned,  cup (250 mg).  Raisins, seedless,  cup (270 mg).  Seeds, sunflower or pumpkin, 1 oz (240 mg).  Soy milk, 1 cup (300 mg).  Spinach,  cup (420 mg).  Spinach, canned,  cup (370 mg).  Sweet potato, baked with skin, 1 medium (450 mg).  Swiss chard,  cup (480 mg).  Tomato or vegetable juice,  cup (275 mg).  Tomato sauce or puree,  cup (400-550 mg).  Tomato, raw, 1 medium (290 mg).  Tomatoes, canned,  cup (200-300 mg).  [place], 3 oz (250 mg).  Wheat germ, 1 oz (250 mg).  Winter squash,  cup (250  mg).  Yogurt, plain or fruited, 6 oz (260-435 mg).  Zucchini,  cup (220 mg).  Moderate in potassium The following foods and beverages have 50-200 mg of potassium per serving:  Apple, 1 each (150 mg).  Apple juice,  cup (150 mg).  Applesauce,  cup (90 mg).  Apricot nectar,  cup (140 mg).  Asparagus, small spears,  cup or 6 spears (155 mg).  Bagel, cinnamon raisin, 1 each (130 mg).  Bagel, egg or plain, 4 in., 1 each (70 mg).  Beans, green,  cup (90 mg).  Beans, yellow,  cup (190 mg).  Beer, regular, 12 oz (100 mg).  Beets, canned,  cup (125 mg).  Blackberries,  cup (115 mg).  Blueberries,  cup (60 mg).  Bread, whole wheat, 1 slice (70 mg).  Broccoli, raw,  cup (145 mg).  Cabbage,  cup (150 mg).  Carrots, cooked or raw,  cup (180 mg).  Cauliflower, raw,  cup (150 mg).  Celery, raw,  cup (155 mg).  Cereal, bran flakes, cup (120-150 mg).  Cheese, cottage,  cup (110 mg).  Cherries, 10 each (150 mg).  Chocolate, 1 oz bar (165 mg).  Coffee, brewed 6 oz (90 mg).  Corn,  cup or 1 ear (195 mg).  Cucumbers,  cup (80 mg).  Egg, large, 1 each (60 mg).  Eggplant,  cup (60 mg).  Endive, raw, cup (80 mg).  English muffin, 1 each (65 mg).  Fish, orange roughy, 3 oz (150 mg).  Frankfurter, beef or pork, 1 each (75 mg).  Fruit cocktail,  cup (115 mg).  Grape juice,  cup (170 mg).  Grapefruit,  fruit (175 mg).  Grapes,  cup (155 mg).  Greens: kale, turnip, collard,  cup (110-150 mg).  Ice cream or frozen yogurt, chocolate,  cup (175 mg).  Ice cream or frozen yogurt, vanilla,  cup (120-150 mg).  Lemons, limes, 1 each (80 mg).  Lettuce, all types, 1 cup (100 mg).  Mixed vegetables,  cup (150 mg).  Mushrooms, raw,  cup (110 mg).  Nuts: walnuts, pecans, or macadamia, 1 oz (125 mg).  Oatmeal,  cup (80 mg).  Okra,  cup (110 mg).  Onions, raw,  cup (120 mg).  Peach, 1 each (185 mg).  Peaches, canned,  cup  (120 mg).  Pears, canned,  cup (120 mg).  Peas, green, frozen,  cup (90 mg).  Peppers, green,  cup (130 mg).  Peppers, red,  cup (160 mg).  Pineapple juice,  cup (165 mg).  Pineapple, fresh or canned,  cup (100 mg).  Plums, 1 each (105 mg).  Pudding, vanilla,  cup (150 mg).  Raspberries,  cup (90 mg).  Rhubarb,  cup (115 mg).  Rice, wild,  cup (80 mg).  Shrimp, 3 oz (155 mg).  Spinach, raw, 1 cup (170 mg).  Strawberries,  cup (125 mg).  Summer squash  cup (175-200 mg).  Swiss chard, raw, 1 cup (135 mg).  Tangerines, 1 each (140 mg).  Tea, brewed, 6 oz (65 mg).  Turnips,  cup (140 mg).  Watermelon,  cup (85 mg).  Wine, red, table, 5 oz (180 mg).  Wine, white, table, 5 oz (100 mg).  Low in potassium The following foods and beverages have less than 50 mg of potassium per serving.  Bread, white, 1 slice (30 mg).  Carbonated beverages, 12 oz (less than 5 mg).  Cheese, 1 oz (20-30 mg).  Cranberries,  cup (45 mg).  Cranberry juice cocktail,  cup (20 mg).  Fats and oils, 1 Tbsp (less than 5 mg).  Hummus, 1 Tbsp (32 mg).  Nectar: papaya, mango, or pear,  cup (35 mg).  Rice, white or brown,  cup (50 mg).  Spaghetti or macaroni,  cup cooked (30 mg).  Tortilla, flour or corn, 1 each (50 mg).  Waffle, 4 in., 1 each (50 mg).  Water chestnuts,  cup (40 mg).  This information is not intended to replace advice given to you by your health care provider. Make sure you discuss any questions you have with your health care provider. Document Released: 09/07/2004 Document Revised: 07/02/2015 Document Reviewed: 12/21/2012 Elsevier Interactive Patient Education  [employer].

## 2016-07-20 DIAGNOSIS — C9000 Multiple myeloma not having achieved remission: Secondary | ICD-10-CM | POA: Diagnosis not present

## 2016-07-21 ENCOUNTER — Telehealth: Payer: Self-pay | Admitting: Cardiology

## 2016-07-21 NOTE — Telephone Encounter (Signed)
Received records from [facility] for appointment on 08/15/16 with Dr Stanford Breed.  Records put with Dr Jacalyn Lefevre schedule for 08/15/16. lp

## 2016-07-22 DIAGNOSIS — C9000 Multiple myeloma not having achieved remission: Secondary | ICD-10-CM | POA: Diagnosis not present

## 2016-07-28 ENCOUNTER — Encounter: Payer: Self-pay | Admitting: Cardiology

## 2016-08-01 NOTE — Progress Notes (Signed)
HPI: FU AVR. Previous AVR in 2006 with a porcine valve. Echocardiogram 11/29/11: Moderate LVH, EF 16-10%, grade 1 diastolic dysfunction, critical aortic stenosis with a mean gradient of 80 mmHg. LHC 11/28/11: Normal coronary arteries, severe aortic stenosis. Patient underwent redo aortic valve replacement with a pericardial tissue valve 12/06/11 with Dr. Roxy Manns. Abdominal ultrasound January 2017 showed no aneurysm. Patient has had problems with GI blood loss and is being treated for multiple myeloma. He has required multiple blood transfusions. ATEE was performed on 10/27/2015 to see if hemolysis from his valve may be contributing. This revealed normal LV systolic function. There was a bioprosthetic aortic valve with elevated mean gradient of 31 mmHg and moderate AI that was paravalvular. There was mild mitral regurgitation, mild biatrial enlargement and mild right ventricular enlargement. I reviewed the patient with Dr. Roxy Manns. He would be high risk for redo aortic valve replacement giving his comorbidities and this would be his third valve replacement. I discussed the patient with Dr. Norma Fredrickson at [facility] who is a member of the hematology oncology department. He felt anemia was multifactorial including hemolysis, myeloma and predominantly GI blood loss. He was being evaluated for GI source and if hemolysis thought to be the major issue in the future we could consider referral to Dr. Albertine Patricia in [place] for closure of his paravalvular leak. 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 Last echo 12/17 showed normal LV function; grade 2 diastolic dysfunction, s/p AVR with moderate AS and mild AI, biatrial enlargement. Had syncope 2/18 felt related to sepsis and anemia (Hgb 4.7). Patient found to be in atrial flutter at last office visit. Since last seen, he denies chest pain, palpitations or syncope. Some dizziness with standing at times. He occasionally has dyspnea on  exertion. He does have pedal edema which has improved since last evaluation.  Current Outpatient Prescriptions  Medication Sig Dispense Refill  . Acetaminophen 500 MG coapsule Take 500 mg by mouth every 6 (six) hours as needed for fever.    . carbidopa-levodopa (SINEMET IR) 25-100 MG tablet Take 3 tablets by mouth 4 (four) times daily.     . cetirizine (ZYRTEC) 10 MG tablet Take 10 mg by mouth daily.    . cyclobenzaprine (FLEXERIL) 10 MG tablet Take 5 mg by mouth 3 (three) times daily as needed for muscle spasms.    Marland Kitchen dexamethasone (DECADRON) 4 MG tablet Take 10 mg by mouth every 7 (seven) days. days 1, 8, 15 and 22 of 28 day cycle.    . diazepam (VALIUM) 2 MG tablet Take 2 mg by mouth at bedtime as needed for anxiety.     . docusate sodium (COLACE) 100 MG capsule Take 100 mg by mouth 2 (two) times daily as needed for moderate constipation.     . finasteride (PROSCAR) 5 MG tablet Take 5 mg by mouth daily.    . fish oil-omega-3 fatty acids 1000 MG capsule Take 1 g by mouth daily.     . folic acid (FOLVITE) 1 MG tablet Take 1 mg by mouth daily.    . furosemide (LASIX) 40 MG tablet Take 1 tablet (40 mg total) by mouth daily. 90 tablet 3  . gabapentin (NEURONTIN) 800 MG tablet Take 800 mg by mouth 4 (four) times daily. Take 1/2 tablet every morning and at bedtime then take 1 tablet at lunch    . HYDROcodone-acetaminophen (NORCO/VICODIN) 5-325 MG tablet Take 1 tablet by mouth every 6 (six) hours as  needed for moderate pain.    . hydrocortisone (ANUSOL-HC) 2.5 % rectal cream Place 1 application rectally 2 (two) times daily.    Marland Kitchen ketoconazole (NIZORAL) 2 % cream Apply 1 application topically daily as needed for irritation.    . metFORMIN (GLUCOPHAGE) 500 MG tablet Take 500 mg by mouth 2 (two) times daily with a meal.    . methylcellulose (ARTIFICIAL TEARS) 1 % ophthalmic solution Place 1 drop into both eyes at bedtime as needed (dry eyes).    Marland Kitchen octreotide (SANDOSTATIN) 100 MCG/ML SOLN injection Inject  100 mcg into the skin every 12 (twelve) hours. .25 mL twice daily for 14 days---Starting 08/10/16    . pantoprazole (PROTONIX) 40 MG tablet Take 1 tablet (40 mg total) by mouth daily. For GERD 30 tablet 3  . polycarbophil (FIBERCON) 625 MG tablet Take 2 tablets by mouth daily.    . pomalidomide (POMALYST) 4 MG capsule Take 4 mg by mouth See admin instructions. Take with water on days 1-21 then  PATIENT IS OFF MED FOR 7 DAYS, Repeat every 28 days.    . pravastatin (PRAVACHOL) 40 MG tablet Take 1 tablet (40 mg total) by mouth every evening. 30 tablet 11  . senna-docusate (SENOKOT-S) 8.6-50 MG tablet Take 1 tablet by mouth daily.    . simethicone (MYLICON) 80 MG chewable tablet Chew 80 mg by mouth every 6 (six) hours as needed for flatulence.    . simvastatin (ZOCOR) 40 MG tablet Take 20 mg by mouth daily.    . traMADol (ULTRAM) 50 MG tablet Take 50 mg by mouth every 6 (six) hours as needed for moderate pain.    Marland Kitchen zinc oxide 20 % ointment Apply 1 application topically 2 (two) times daily as needed for irritation.     No current facility-administered medications for this visit.      Past Medical History:  Diagnosis Date  . Allergic rhinitis   . Anemia 2009  . Aortic stenosis    a. s/p porcine AVR 2006;  b. s/p redo tissue AVR 12/2011 (Dr. Roxy Manns) - preAVR LHC with no CAD  . Asthma   . BPH (benign prostatic hypertrophy)   . Cancer ([place])    multiple myeloma  . Cataract   . Chronic cough   . Chronic interstitial cystitis   . Depression    pt denies  . Diabetes mellitus    type 2  . Diabetes mellitus without complication ([place])   . Diverticulosis of colon    on colonoscopy 2008  . GERD (gastroesophageal reflux disease)    bravo pH study 2008  . Gout   . H/O aortic valve replacement with porcine valve    2006, 2013  . Headache(784.0)   . Hemorrhoids    external and internal  . Hiatal hernia   . HTN (hypertension)   . Hx of echocardiogram    a. Echo  (post AVR) 12/2011:  mod LVH, EF  60-65%, Gr 2 diast dysfn, mild AI, AVR ok (mean gradient 19 mmHg), MAC, mild BAE  . Hyperlipidemia   . Hypertension   . Hyperthyroidism   . IBS (irritable bowel syndrome)   . Insomnia   . Lower GI bleed 07/16/2015  . OA (osteoarthritis)   . OSA (obstructive sleep apnea)    USES CPAP AS NEEDED  . 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 Periodontitis    chronic with bone loss  . Restless leg syndrome   . S/P aortic valve replacement with bioprosthetic  valve 12/06/2011   Redo AVR using 23 mm [facility] Ease pericardial tissue valve    Past Surgical History:  Procedure Laterality Date  . AORTA - FEMORAL ARTERY BYPASS GRAFT    . AORTIC VALVE REPLACEMENT  10/13/2004   72m [employer] [place]imount pericardial tissue valve  . AORTIC VALVE REPLACEMENT  12/06/2011   Procedure: REDO AORTIC VALVE REPLACEMENT (AVR);  Surgeon: CRexene Alberts MD;  [place] [facility]  Service: Open Heart Surgery;  Laterality: N/A;  . BUNIONECTOMY     right  . CARDIAC SURGERY     aorta vavle replacement  . CATARACT EXTRACTION  2009    &   2012   BILATERAL  . COLONOSCOPY  08/31/2011   Procedure: COLONOSCOPY;  Surgeon: RInda Castle MD;  [facility] [facility]  Service: Endoscopy;  Laterality: N/A;  . ESOPHAGOGASTRODUODENOSCOPY  08/30/2011   Procedure: ESOPHAGOGASTRODUODENOSCOPY (EGD);  Surgeon: RInda Castle MD;  [place] [facility]  Service: Endoscopy;  Laterality: N/A;  Rm 3005   . HERNIA REPAIR    . JOINT REPLACEMENT     knee  . LEFT HEART CATHETERIZATION WITH CORONARY ANGIOGRAM N/A 11/30/2011   Procedure: LEFT HEART CATHETERIZATION WITH CORONARY ANGIOGRAM;  Surgeon: CBurnell Blanks MD;  [facility];  Service: Cardiovascular;  Laterality: N/A;  . NASAL SEPTOPLASTY W/ TURBINOPLASTY    . REFRACTIVE SURGERY     bilateral  . RIGHT HEART CATHETERIZATION Bilateral 11/30/2011   Procedure: RIGHT HEART CATH;  Surgeon: CBurnell Blanks MD;  [facility];   Service: Cardiovascular;  Laterality: Bilateral;  . right knee arthroscopy    . TEE WITHOUT CARDIOVERSION N/A 10/27/2015   Procedure: TRANSESOPHAGEAL ECHOCARDIOGRAM (TEE);  Surgeon: BLelon Perla MD;  [facility];  Service: Cardiovascular;  Laterality: N/A;    Social History   Social History  . Marital status: Married    Spouse name: N/A  . Number of children: 0  . Years of education: N/A   Occupational History  . retired    Social History Main Topics  . Smoking status: Former Smoker    Quit date: 09/30/1971  . Smokeless tobacco: Never Used     Comment: Quit August 973  . Alcohol use No  . Drug use: No  . Sexual activity: Not Currently   Other Topics Concern  . Not on file   Social History Narrative   ** Merged History Encounter **        Family History  Problem Relation Age of Onset  . Heart disease Father   . Osteoarthritis Mother   . Hypertension Sister   . Hyperlipidemia Unknown   . Cancer Maternal Aunt     ROS: Fatigue but no fevers or chills, productive cough, hemoptysis, dysphasia, odynophagia, melena, hematochezia, dysuria, hematuria, rash, seizure activity, orthopnea, PND, claudication. Remaining systems are negative.  Physical Exam: Well-developed well-nourished in no acute distress.  Skin is warm and dry.  HEENT is normal.  Neck is supple.  Chest is clear to auscultation with normal expansion.  Cardiovascular exam is irregular, 3/6 systolic murmur Abdominal exam nontender or distended. No masses palpated. Extremities show 1+ edema. neuro grossly intact  ECG- Atrial flutter at a rate of 97. No ST changes. personally reviewed  A/P  1 typical flutter-patient remains in atrial flutter today. His rate is high normal. He does have some dizziness with standing and I and therefore hesitant to add calcium blocker or beta blocker. We will arrange a 24-hour Holter monitor.  If rate is elevated I will consider adding amiodarone to assist with  rate control. CHADSvasc 3. He would benefit from anticoagulation long-term. However he has severe problems with GI blood loss. He is therefore not a candidate. Given that we cannot anticoagulate we can therefore not pursue cardioversion.   2 Hypertension-blood pressure is controlled.  3 status post aortic valve replacement-most recent echocardiogram showed moderate aortic stenosis and mild aortic insufficiency. Previous transesophageal echocardiogram showed moderate paravalvular aortic insufficiency. We felt this could be contributing to his anemia through hemolysis. However hematology felt this was more likely GI blood loss and multiple myeloma. We can consider referral for closure of his paravalvular leak in the future if it is felt to be contributing to his anemia. Continue SBE prophylaxis.  4 hyperlipidemia-continue simvastatin.  5 anemia-this is multifactorial upper anomaly GI blood loss. Followed by hematology oncology at [facility].  6 multiple myeloma  7 Chronic diastolic congestive heart failure-he is mildly volume overloaded. Increase Lasix to 60 mg daily. Check potassium and renal function in 1 week.   Kirk Ruths, MD

## 2016-08-15 ENCOUNTER — Encounter: Payer: Self-pay | Admitting: Cardiology

## 2016-08-15 ENCOUNTER — Ambulatory Visit (INDEPENDENT_AMBULATORY_CARE_PROVIDER_SITE_OTHER): Payer: Medicare Other | Admitting: Cardiology

## 2016-08-15 VITALS — BP 100/86 | HR 97 | Ht 69.0 in | Wt 195.6 lb

## 2016-08-15 DIAGNOSIS — D649 Anemia, unspecified: Secondary | ICD-10-CM | POA: Diagnosis not present

## 2016-08-15 DIAGNOSIS — I5032 Chronic diastolic (congestive) heart failure: Secondary | ICD-10-CM | POA: Diagnosis not present

## 2016-08-15 DIAGNOSIS — Z952 Presence of prosthetic heart valve: Secondary | ICD-10-CM

## 2016-08-15 DIAGNOSIS — I483 Typical atrial flutter: Secondary | ICD-10-CM | POA: Diagnosis not present

## 2016-08-15 DIAGNOSIS — I10 Essential (primary) hypertension: Secondary | ICD-10-CM | POA: Diagnosis not present

## 2016-08-15 DIAGNOSIS — I5033 Acute on chronic diastolic (congestive) heart failure: Secondary | ICD-10-CM

## 2016-08-15 DIAGNOSIS — I38 Endocarditis, valve unspecified: Secondary | ICD-10-CM

## 2016-08-15 MED ORDER — FUROSEMIDE 40 MG PO TABS: 60.0000 mg | ORAL_TABLET | Freq: Every day | ORAL | 3 refills | Status: DC

## 2016-08-15 NOTE — Patient Instructions (Signed)
Medication Instructions:   INCREASE FUROSEMIDE TO 60 MG ONCE DAILY= 1 AND 1/2 OF THE 40 MG TABLET ONCE DAILY  Labwork:  Your physician recommends that you return for lab work in: [place]  Testing/Procedures:  Your physician has recommended that you wear a 24 HOUR holter monitor. Holter monitors are medical devices that record the heart's electrical activity. Doctors most often use these monitors to diagnose arrhythmias. Arrhythmias are problems with the speed or rhythm of the heartbeat. The monitor is a small, portable device. You can wear one while you do your normal daily activities. This is usually used to diagnose what is causing palpitations/syncope (passing out).    Follow-Up:  Your physician recommends that you schedule a follow-up appointment in: [place]

## 2016-08-19 ENCOUNTER — Emergency Department (HOSPITAL_COMMUNITY): Payer: Medicare Other

## 2016-08-19 ENCOUNTER — Encounter (HOSPITAL_COMMUNITY): Payer: Self-pay | Admitting: Emergency Medicine

## 2016-08-19 ENCOUNTER — Inpatient Hospital Stay (HOSPITAL_COMMUNITY)
Admission: EM | Admit: 2016-08-19 | Discharge: 2016-08-22 | DRG: 291 | Disposition: A | Discharge status: Home / Self Care | Payer: Medicare Other | Attending: Internal Medicine | Admitting: Internal Medicine

## 2016-08-19 DIAGNOSIS — I4430 Unspecified atrioventricular block: Secondary | ICD-10-CM | POA: Diagnosis present

## 2016-08-19 DIAGNOSIS — Z9841 Cataract extraction status, right eye: Secondary | ICD-10-CM

## 2016-08-19 DIAGNOSIS — Z9842 Cataract extraction status, left eye: Secondary | ICD-10-CM

## 2016-08-19 DIAGNOSIS — G4733 Obstructive sleep apnea (adult) (pediatric): Secondary | ICD-10-CM | POA: Diagnosis present

## 2016-08-19 DIAGNOSIS — D61818 Other pancytopenia: Secondary | ICD-10-CM | POA: Diagnosis present

## 2016-08-19 DIAGNOSIS — Z953 Presence of xenogenic heart valve: Secondary | ICD-10-CM

## 2016-08-19 DIAGNOSIS — R0602 Shortness of breath: Secondary | ICD-10-CM | POA: Diagnosis not present

## 2016-08-19 DIAGNOSIS — J9601 Acute respiratory failure with hypoxia: Secondary | ICD-10-CM | POA: Diagnosis not present

## 2016-08-19 DIAGNOSIS — I959 Hypotension, unspecified: Secondary | ICD-10-CM | POA: Diagnosis present

## 2016-08-19 DIAGNOSIS — I110 Hypertensive heart disease with heart failure: Principal | ICD-10-CM | POA: Diagnosis present

## 2016-08-19 DIAGNOSIS — Z888 Allergy status to other drugs, medicaments and biological substances status: Secondary | ICD-10-CM

## 2016-08-19 DIAGNOSIS — I5033 Acute on chronic diastolic (congestive) heart failure: Secondary | ICD-10-CM | POA: Diagnosis present

## 2016-08-19 DIAGNOSIS — Z8249 Family history of ischemic heart disease and other diseases of the circulatory system: Secondary | ICD-10-CM

## 2016-08-19 DIAGNOSIS — C9000 Multiple myeloma not having achieved remission: Secondary | ICD-10-CM | POA: Diagnosis not present

## 2016-08-19 DIAGNOSIS — E871 Hypo-osmolality and hyponatremia: Secondary | ICD-10-CM | POA: Diagnosis present

## 2016-08-19 DIAGNOSIS — I352 Nonrheumatic aortic (valve) stenosis with insufficiency: Secondary | ICD-10-CM | POA: Diagnosis present

## 2016-08-19 DIAGNOSIS — K219 Gastro-esophageal reflux disease without esophagitis: Secondary | ICD-10-CM | POA: Diagnosis present

## 2016-08-19 DIAGNOSIS — Z7984 Long term (current) use of oral hypoglycemic drugs: Secondary | ICD-10-CM

## 2016-08-19 DIAGNOSIS — Z809 Family history of malignant neoplasm, unspecified: Secondary | ICD-10-CM

## 2016-08-19 DIAGNOSIS — I509 Heart failure, unspecified: Secondary | ICD-10-CM

## 2016-08-19 DIAGNOSIS — Z79899 Other long term (current) drug therapy: Secondary | ICD-10-CM

## 2016-08-19 DIAGNOSIS — E118 Type 2 diabetes mellitus with unspecified complications: Secondary | ICD-10-CM | POA: Diagnosis present

## 2016-08-19 DIAGNOSIS — K922 Gastrointestinal hemorrhage, unspecified: Secondary | ICD-10-CM | POA: Diagnosis present

## 2016-08-19 DIAGNOSIS — R0902 Hypoxemia: Secondary | ICD-10-CM | POA: Diagnosis present

## 2016-08-19 DIAGNOSIS — J45909 Unspecified asthma, uncomplicated: Secondary | ICD-10-CM | POA: Diagnosis present

## 2016-08-19 DIAGNOSIS — Z885 Allergy status to narcotic agent status: Secondary | ICD-10-CM

## 2016-08-19 DIAGNOSIS — G2581 Restless legs syndrome: Secondary | ICD-10-CM | POA: Diagnosis present

## 2016-08-19 DIAGNOSIS — R0600 Dyspnea, unspecified: Secondary | ICD-10-CM | POA: Diagnosis present

## 2016-08-19 DIAGNOSIS — I4892 Unspecified atrial flutter: Secondary | ICD-10-CM | POA: Diagnosis present

## 2016-08-19 DIAGNOSIS — Z87891 Personal history of nicotine dependence: Secondary | ICD-10-CM

## 2016-08-19 DIAGNOSIS — Z9889 Other specified postprocedural states: Secondary | ICD-10-CM

## 2016-08-19 DIAGNOSIS — R7989 Other specified abnormal findings of blood chemistry: Secondary | ICD-10-CM | POA: Diagnosis not present

## 2016-08-19 DIAGNOSIS — M1990 Unspecified osteoarthritis, unspecified site: Secondary | ICD-10-CM | POA: Diagnosis present

## 2016-08-19 DIAGNOSIS — D500 Iron deficiency anemia secondary to blood loss (chronic): Secondary | ICD-10-CM | POA: Diagnosis present

## 2016-08-19 LAB — COMPREHENSIVE METABOLIC PANEL
ALBUMIN: 3.3 g/dL — AB (ref 3.5–5.0)
ALT: 8 U/L — AB (ref 17–63)
AST: 39 U/L (ref 15–41)
Alkaline Phosphatase: 156 U/L — ABNORMAL HIGH (ref 38–126)
Anion gap: 11 (ref 5–15)
BUN: 13 mg/dL (ref 6–20)
CHLORIDE: 99 mmol/L — AB (ref 101–111)
CO2: 22 mmol/L (ref 22–32)
CREATININE: 0.59 mg/dL — AB (ref 0.61–1.24)
Calcium: 9 mg/dL (ref 8.9–10.3)
GFR calc non Af Amer: >60 mL/min (ref >60)
GLUCOSE: 111 mg/dL — AB (ref 65–99)
Potassium: 3.6 mmol/L (ref 3.5–5.1)
SODIUM: 132 mmol/L — AB (ref 135–145)
Total Bilirubin: 2.3 mg/dL — ABNORMAL HIGH (ref 0.3–1.2)
Total Protein: 5.9 g/dL — ABNORMAL LOW (ref 6.5–8.1)

## 2016-08-19 LAB — I-STAT ARTERIAL BLOOD GAS, ED
Acid-Base Excess: 1 mmol/L (ref 0.0–2.0)
Bicarbonate: 24.2 mmol/L (ref 20.0–28.0)
O2 Saturation: 100 %
PCO2 ART: 32.2 mmHg (ref 32.0–48.0)
PH ART: 7.484 — AB (ref 7.350–7.450)
PO2 ART: 305 mmHg — AB (ref 83.0–108.0)
Patient temperature: 98.6
TCO2: 25 mmol/L (ref 0–100)

## 2016-08-19 LAB — PROTIME-INR
INR: 1.17
Prothrombin Time: 15 seconds (ref 11.4–15.2)

## 2016-08-19 LAB — CBC
HEMATOCRIT: 27 % — AB (ref 39.0–52.0)
Hemoglobin: 8 g/dL — ABNORMAL LOW (ref 13.0–17.0)
MCH: 26.2 pg (ref 26.0–34.0)
MCHC: 29.6 g/dL — ABNORMAL LOW (ref 30.0–36.0)
MCV: 88.5 fL (ref 78.0–100.0)
Platelets: 132 10*3/uL — ABNORMAL LOW (ref 150–400)
RBC: 3.05 MIL/uL — AB (ref 4.22–5.81)
RDW: 21.1 % — ABNORMAL HIGH (ref 11.5–15.5)
WBC: 3.2 10*3/uL — AB (ref 4.0–10.5)

## 2016-08-19 LAB — D-DIMER, QUANTITATIVE: D-Dimer, Quant: 0.82 ug/mL-FEU — ABNORMAL HIGH (ref 0.00–0.50)

## 2016-08-19 LAB — I-STAT TROPONIN, ED: Troponin i, poc: 0.02 ng/mL (ref 0.00–0.08)

## 2016-08-19 LAB — BRAIN NATRIURETIC PEPTIDE: B NATRIURETIC PEPTIDE 5: 368 pg/mL — AB (ref 0.0–100.0)

## 2016-08-19 MED ORDER — IOPAMIDOL (ISOVUE-370) INJECTION 76%
INTRAVENOUS | Status: AC
  Administered 2016-08-19: 100 mL
  Filled 2016-08-19: qty 100

## 2016-08-19 MED ORDER — FUROSEMIDE 10 MG/ML IJ SOLN
40.0000 mg | Freq: Once | INTRAMUSCULAR | Status: AC
  Administered 2016-08-20: 40 mg via INTRAVENOUS
  Filled 2016-08-19: qty 4

## 2016-08-19 NOTE — ED Notes (Signed)
Patient transported to CT 

## 2016-08-19 NOTE — ED Notes (Addendum)
Waiting for xray  Then will collect 2nd set blood cultures

## 2016-08-19 NOTE — ED Triage Notes (Signed)
Patient with history of multiple myeloma with acute onset of shortness of breath.  Patient with shortness of breath for last 2 hours.  Patient placed on CPAP with EMS.  Patient is pale, dry and obviously air hungered.  Patient very sleepy upon arrival to ED.  BS clear, slightly diminished bilaterally per EMS.  Denies any CP.

## 2016-08-19 NOTE — ED Notes (Signed)
Pt placed on venti mask and o2 saturation is 100%

## 2016-08-19 NOTE — ED Notes (Signed)
Pt ambulatory at bedside to use the urinal. Pt saturations stayed above 90%.

## 2016-08-19 NOTE — ED Provider Notes (Signed)
[place] DEPT Provider Note   CSN: [id number] Arrival date & time: 08/19/16  2029     History   Chief Complaint Chief Complaint  Patient presents with  . Shortness of Breath    HPI [name] is a 71 y.o. male.  The history is provided by the patient and the spouse.  Illness  This is a recurrent problem. The current episode started 6 to 12 hours ago. The problem occurs constantly. The problem has not changed since onset.Associated symptoms include shortness of breath. Pertinent negatives include no chest pain, no abdominal pain and no headaches. Associated symptoms comments: Acute onset of shortness of breath. Nothing aggravates the symptoms. Relieved by: Bipap.    Past Medical History:  Diagnosis Date  . Allergic rhinitis   . Anemia 2009  . Aortic stenosis    a. s/p porcine AVR 2006;  b. s/p redo tissue AVR 12/2011 (Dr. Roxy Manns) - preAVR LHC with no CAD  . Asthma   . BPH (benign prostatic hypertrophy)   . Cancer ([place])    multiple myeloma  . Cataract   . Chronic cough   . Chronic interstitial cystitis   . Depression    pt denies  . Diabetes mellitus    type 2  . Diabetes mellitus without complication ([place])   . Diverticulosis of colon    on colonoscopy 2008  . GERD (gastroesophageal reflux disease)    bravo pH study 2008  . Gout   . H/O aortic valve replacement with porcine valve    2006, 2013  . Headache(784.0)   . Hemorrhoids    external and internal  . Hiatal hernia   . HTN (hypertension)   . Hx of echocardiogram    a. Echo  (post AVR) 12/2011:  mod LVH, EF 60-65%, Gr 2 diast dysfn, mild AI, AVR ok (mean gradient 19 mmHg), MAC, mild BAE  . Hyperlipidemia   . Hypertension   . Hyperthyroidism   . IBS (irritable bowel syndrome)   . Insomnia   . Lower GI bleed 07/16/2015  . OA (osteoarthritis)   . OSA (obstructive sleep apnea)    USES CPAP AS NEEDED  . 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 Periodontitis    chronic with bone  loss  . Restless leg syndrome   . S/P aortic valve replacement with bioprosthetic valve 12/06/2011   Redo AVR using 23 mm [facility] Ease pericardial tissue valve    Patient Active Problem List   Diagnosis Date Noted  . Sepsis, unspecified organism ([place]) 03/23/2016  . Acute on chronic diastolic congestive heart failure ([place]) 03/23/2016  . Influenza A 03/23/2016  . Acute respiratory failure ([place]) 03/23/2016  . 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 Rectal bleeding   . Internal bleeding hemorrhoids   . Symptomatic anemia 07/16/2015  . GI bleed 07/16/2015  . Hemorrhoids 07/16/2015  . Multiple myeloma not having achieved remission ([place])   . Syncope and collapse   . Acute blood loss anemia 03/17/2015  . Bruit 01/19/2015  . S/P aortic valve replacement with bioprosthetic valve 12/06/2011  . Aortic stenosis 11/30/2011  . Chronic daily headache 11/30/2011  . Iron deficiency anemia, unspecified 09/15/2011  . IBS (irritable bowel syndrome) 09/15/2011  . Syncope 08/29/2011  . Cough syncope 10/13/2010  . HYPERLIPIDEMIA-MIXED 07/02/2008  . Essential hypertension 07/02/2008  . GERD 07/02/2008  . OBSTRUCTIVE SLEEP APNEA 01/15/2007  . ALLERGIC  RHINITIS 01/15/2007  . INSOMNIA 01/15/2007  . OSTEOARTHRITIS 12/02/2006  .  BPH (benign prostatic hyperplasia) 12/02/2006  . H/O aortic valve replacement 12/02/2006    Past Surgical History:  Procedure Laterality Date  . AORTA - FEMORAL ARTERY BYPASS GRAFT    . AORTIC VALVE REPLACEMENT  10/13/2004   81m [place] pericardial tissue valve  . AORTIC VALVE REPLACEMENT  12/06/2011   Procedure: REDO AORTIC VALVE REPLACEMENT (AVR);  Surgeon: CRexene Alberts MD;  [facility]  Service: Open Heart Surgery;  Laterality: N/A;  . BUNIONECTOMY     right  . CARDIAC SURGERY     aorta vavle replacement  . CATARACT EXTRACTION  2009    &   2012   BILATERAL  . COLONOSCOPY  08/31/2011   Procedure: COLONOSCOPY;  Surgeon:  RInda Castle MD;  Location: [facility]  Service: Endoscopy;  Laterality: N/A;  . ESOPHAGOGASTRODUODENOSCOPY  08/30/2011   Procedure: ESOPHAGOGASTRODUODENOSCOPY (EGD);  Surgeon: RInda Castle MD;  [facility]  Service: Endoscopy;  Laterality: N/A;  Rm 3005   . HERNIA REPAIR    . JOINT REPLACEMENT     knee  . LEFT HEART CATHETERIZATION WITH CORONARY ANGIOGRAM N/A 11/30/2011   Procedure: LEFT HEART CATHETERIZATION WITH CORONARY ANGIOGRAM;  Surgeon: CBurnell Blanks MD;  [facility];  Service: Cardiovascular;  Laterality: N/A;  . NASAL SEPTOPLASTY W/ TURBINOPLASTY    . REFRACTIVE SURGERY     bilateral  . RIGHT HEART CATHETERIZATION Bilateral 11/30/2011   Procedure: RIGHT HEART CATH;  Surgeon: CBurnell Blanks MD;  [facility];  Service: Cardiovascular;  Laterality: Bilateral;  . right knee arthroscopy    . TEE WITHOUT CARDIOVERSION N/A 10/27/2015   Procedure: TRANSESOPHAGEAL ECHOCARDIOGRAM (TEE);  Surgeon: BLelon Perla MD;  [facility];  Service: Cardiovascular;  Laterality: N/A;       Home Medications    Prior to Admission medications   Medication Sig Start Date End Date Taking? Authorizing Provider  Acetaminophen 500 MG coapsule Take 500 mg by mouth every 6 (six) hours as needed for fever.   Yes [provider]  carbidopa-levodopa (SINEMET IR) 25-100 MG tablet Take 3 tablets by mouth 4 (four) times daily.    Yes [provider]  cetirizine (ZYRTEC) 10 MG tablet Take 10 mg by mouth daily.   Yes [provider]  dexamethasone (DECADRON) 4 MG tablet Take 10 mg by mouth every 7 (seven) days. days 1, 8, 15 and 22 of 28 day cycle. 11/19/15  Yes [provider]  diazepam (VALIUM) 2 MG tablet Take 2 mg by mouth at bedtime as needed for anxiety.    Yes [provider]  docusate sodium (COLACE) 100 MG capsule Take 100 mg by mouth 2 (two) times daily as needed for moderate  constipation.  08/20/15  Yes [provider]  finasteride (PROSCAR) 5 MG tablet Take 5 mg by mouth daily.   Yes [provider]  fish oil-omega-3 fatty acids 1000 MG capsule Take 1 g by mouth daily.    Yes [provider]  folic acid (FOLVITE) 1 MG tablet Take 1 mg by mouth daily. 09/24/15  Yes [provider]  furosemide (LASIX) 40 MG tablet Take 1.5 tablets (60 mg total) by mouth daily. 08/15/16  Yes CLelon Perla MD  gabapentin (NEURONTIN) 800 MG tablet Take 800 mg by mouth 4 (four) times daily. Take 1/2 tablet every morning and at bedtime then take 1 tablet at lunch   Yes [provider]  metFORMIN (GLUCOPHAGE) 500 MG  tablet Take 500 mg by mouth 2 (two) times daily with a meal.   Yes [provider]  methylcellulose (ARTIFICIAL TEARS) 1 % ophthalmic solution Place 1 drop into both eyes at bedtime as needed (dry eyes).   Yes [provider]  octreotide (SANDOSTATIN) 100 MCG/ML SOLN injection Inject 100 mcg into the skin every 12 (twelve) hours. .25 mL twice daily for 14 days---Starting 08/10/16   Yes [provider]  pantoprazole (PROTONIX) 40 MG tablet Take 1 tablet (40 mg total) by mouth daily. For GERD 07/16/15  Yes Rai, Ripudeep K, MD  polycarbophil (FIBERCON) 625 MG tablet Take 2 tablets by mouth daily.   Yes [provider]  pomalidomide (POMALYST) 4 MG capsule Take 4 mg by mouth See admin instructions. Take with water on days 1-21 then  PATIENT IS OFF MED FOR 7 DAYS, Repeat every 28 days.   Yes [provider]  senna-docusate (SENOKOT-S) 8.6-50 MG tablet Take 1 tablet by mouth daily.   Yes [provider]  simvastatin (ZOCOR) 40 MG tablet Take 20 mg by mouth daily. 09/24/15  Yes [provider]  cyclobenzaprine (FLEXERIL) 10 MG tablet Take 5 mg by mouth 3 (three) times daily as needed for muscle spasms.    [provider]  HYDROcodone-acetaminophen (NORCO/VICODIN) 5-325 MG tablet  Take 1 tablet by mouth every 6 (six) hours as needed for moderate pain.    [provider]  hydrocortisone (ANUSOL-HC) 2.5 % rectal cream Place 1 application rectally 2 (two) times daily.    [provider]  ketoconazole (NIZORAL) 2 % cream Apply 1 application topically daily as needed for irritation.    [provider]  simethicone (MYLICON) 80 MG chewable tablet Chew 80 mg by mouth every 6 (six) hours as needed for flatulence.    [provider]  traMADol (ULTRAM) 50 MG tablet Take 50 mg by mouth every 6 (six) hours as needed for moderate pain.    [provider]  zinc oxide 20 % ointment Apply 1 application topically 2 (two) times daily as needed for irritation.    [provider]    Family History Family History  Problem Relation Age of Onset  . Heart disease Father   . Osteoarthritis Mother   . Hypertension Sister   . Hyperlipidemia Unknown   . Cancer Maternal Aunt     Social History Social History  Substance Use Topics  . Smoking status: Former Smoker    Quit date: 09/30/1971  . Smokeless tobacco: Never Used     Comment: Quit August 973  . Alcohol use No     Allergies   Avodart [dutasteride]; Spironolactone; Codeine; Diazepam; Doxazosin; and Feraheme [ferumoxytol]   Review of Systems Review of Systems  Constitutional: Negative for fever.  HENT: Negative for sore throat.   Respiratory: Positive for chest tightness and shortness of breath. Negative for cough and wheezing.   Cardiovascular: Negative for chest pain.  Gastrointestinal: Negative for abdominal pain, nausea and vomiting.  Endocrine: Negative for polyuria.  Genitourinary: Negative for dysuria.  Musculoskeletal: Negative for back pain.  Skin: Negative for wound.  Allergic/Immunologic: Positive for immunocompromised state.  Neurological: Negative for seizures and headaches.  Psychiatric/Behavioral: Negative for agitation and behavioral problems.      Physical Exam Updated Vital Signs BP 95/76   Pulse 95   Resp 20   SpO2 100%   Physical Exam  Constitutional: He appears well-developed and well-nourished.  HENT:  Head: Normocephalic and atraumatic.  Eyes: Conjunctivae  are normal.  Neck: Neck supple.  Cardiovascular: Regular rhythm.   No murmur heard. Tachycardic  Pulmonary/Chest: He is in respiratory distress.  Patient with abdominal retractions, on BiPAP.  Abdominal: Soft. There is no tenderness.  Musculoskeletal: He exhibits no edema or deformity.  Neurological: He is alert.  Skin: Skin is warm and dry.  Psychiatric: He has a normal mood and affect.  Nursing note and vitals reviewed.    ED Treatments / Results  Labs (all labs ordered are listed, but only abnormal results are displayed) Labs Reviewed  CBC - Abnormal; Notable for the following:       Result Value   WBC 3.2 (*)    RBC 3.05 (*)    Hemoglobin 8.0 (*)    HCT 27.0 (*)    MCHC 29.6 (*)    RDW 21.1 (*)    Platelets 132 (*)    All other components within normal limits  BRAIN NATRIURETIC PEPTIDE - Abnormal; Notable for the following:    B Natriuretic Peptide 368.0 (*)    All other components within normal limits  COMPREHENSIVE METABOLIC PANEL - Abnormal; Notable for the following:    Sodium 132 (*)    Chloride 99 (*)    Glucose, Bld 111 (*)    Creatinine, Ser 0.59 (*)    Total Protein 5.9 (*)    Albumin 3.3 (*)    ALT 8 (*)    Alkaline Phosphatase 156 (*)    Total Bilirubin 2.3 (*)    All other components within normal limits  D-DIMER, QUANTITATIVE (NOT AT [facility]) - Abnormal; Notable for the following:    D-Dimer, Quant 0.82 (*)    All other components within normal limits  I-STAT ARTERIAL BLOOD GAS, ED - Abnormal; Notable for the following:    pH, Arterial 7.484 (*)    pO2, Arterial 305.0 (*)    All other components within normal limits  CULTURE, BLOOD (ROUTINE X 2)  CULTURE, BLOOD (ROUTINE X 2)  PROTIME-INR  I-STAT TROPOININ, ED   I-STAT CG4 LACTIC ACID, ED    EKG  EKG Interpretation  Date/Time:  Friday August 19 2016 20:35:10 EDT Ventricular Rate:  107 PR Interval:    QRS Duration: 106 QT Interval:  379 QTC Calculation: 501 R Axis:   -75 Text Interpretation:  Sinus tachycardia Premature ventricular complexes Non-specific ST-t changes No significant change since last tracing Confirmed by Pattricia Boss [phone]) on 08/19/2016 11:59:35 PM       Radiology Ct Angio Chest Pe W And/or Wo Contrast  Result Date: 08/19/2016 CLINICAL DATA:  Shortness of breath for 2 hours.  Elevated D-dimer. EXAM: CT ANGIOGRAPHY CHEST WITH CONTRAST TECHNIQUE: Multidetector CT imaging of the chest was performed using the standard protocol during bolus administration of intravenous contrast. Multiplanar CT image reconstructions and MIPs were obtained to evaluate the vascular anatomy. CONTRAST:  80 cc Isovue 370 IV COMPARISON:  Radiograph earlier this day.  Chest CT 08/14/2010 FINDINGS: Cardiovascular: There are no filling defects within the pulmonary arteries to suggest pulmonary embolus. There is multi chamber cardiomegaly. Prosthetic aortic valve. Thoracic aorta is normal in caliber mild aortic atherosclerosis. No evidence of dissection. There is contrast refluxing into the hepatic veins and IVC. No pericardial effusion. There are coronary artery calcifications. Mediastinum/Nodes: Small prevascular nodes measuring up to 8 mm. No hilar adenopathy. The esophagus is decompressed. Visualized thyroid gland is normal. Lungs/Pleura: Elevated right hemidiaphragm with adjacent compressive atelectasis. There is smooth septal thickening consistent with pulmonary edema. Minimal patchy ground-glass  opacity in the perihilar upper lobes also likely pulmonary edema. Mild hypoventilatory atelectasis at the lung bases. Small right pleural effusion versus perihepatic ascites, diaphragm not well-defined. No left pleural fluid. Upper Abdomen: Spleen is enlarged measuring  15.8 cm. Musculoskeletal: There are no acute or suspicious osseous abnormalities. There is a prominent Schmorl's node involving superior endplate of P38 of uncertain acuity. The bones appear under mineralized. Post median sternotomy. Review of the MIP images confirms the above findings. IMPRESSION: 1. No pulmonary embolus. 2. Post aortic valve replacement. Cardiomegaly with contrast refluxing into the hepatic veins and IVC consistent with the liver right heart pressures. Mild pulmonary edema. 3. Mild aortic atherosclerosis and coronary artery calcifications. 4. Incidental splenomegaly in the upper abdomen. Aortic Atherosclerosis (ICD10-I70.0). Electronically Signed   By: Jeb Levering M.D.   On: 08/19/2016 23:42   Dg Chest Portable 1 View  Result Date: 08/19/2016 CLINICAL DATA:  Multiple myeloma, acute onset of shortness of breath over last 2 hours, history hypertension, GERD EXAM: PORTABLE CHEST 1 VIEW COMPARISON:  Portable exam 2039 hours compared to 03/23/2016 FINDINGS: Upper normal size of cardiac silhouette post AVR. Mediastinal contours and pulmonary vascularity normal. Chronic RIGHT basilar atelectasis. Chronic accentuation of interstitial markings little changed. No segmental consolidation, pleural effusion or pneumothorax. Bones demineralized. IMPRESSION: Chronic RIGHT basilar atelectasis. Post AVR. Electronically Signed   By: Lavonia Dana M.D.   On: 08/19/2016 20:58    Procedures Procedures (including critical care time)  Medications Ordered in ED Medications  furosemide (LASIX) injection 40 mg (not administered)  vancomycin (VANCOCIN) IVPB 1000 mg/200 mL premix (not administered)  piperacillin-tazobactam (ZOSYN) IVPB 3.375 g (not administered)  iopamidol (ISOVUE-370) 76 % injection (100 mLs  Contrast Given 08/19/16 2259)     Initial Impression / Assessment and Plan / ED Course  I have reviewed the triage vital signs and the nursing notes.  Pertinent labs & imaging results that were  available during my care of the patient were reviewed by me and considered in my medical decision making (see chart for details).     Patient presents with acute onset of shortness of breath this evening. Was watching TV when it occurred. Pt with history of multiple myeloma currently undergoing clinical trial with a chemotherapeutic drug. Patient arrived on BiPAP with some abdominal retractions. Initially satting in the high 80s with improvement to 100% saturation. Able to wean to nonrebreather.  Initial ABG shows pH 7.44, PCO2 32, bicarbonate 24. Troponin negative. CBC shows white count of 3.2, hemoglobin 8. Wife states he underwent blood transfusion earlier this morning. B type natruretic peptide elevated. Elevated d-dimer, CT PE shows no pulmonary embolism. Doubt PE as well as ACS.  Considering patient with elevated B-type natriuretic peptide as well as shortness of breath, patient given 40 Lasix IV and cardiology was consulted also considering that he has a history of aortic valve replacement with some aortic regurg last noted on his TEE in September. He was admitted to the hospitalist for further management and cardiology made aware. Stable while under my care in the emergency department.  Patient was seen with my attending, Dr. Jeanell Sparrow, who voiced agreement and oversaw the evaluation and treatment of this patient.   [employer] seismologist was used in the creation of this note. If there are any errors or inconsistencies needing clarification, please contact me directly.     Final Clinical Impressions(s) / ED Diagnoses   Final diagnoses:  SOB (shortness of breath)    New Prescriptions New Prescriptions  No medications on file     Valda Lamb, MD 08/20/16 [place] Crosby, MD 08/30/16 [phone]

## 2016-08-19 NOTE — Progress Notes (Signed)
Placed pt on BiPAP at this time. Pt tolerating well, RT to continue to monitor as needed

## 2016-08-20 ENCOUNTER — Encounter (HOSPITAL_COMMUNITY): Payer: Self-pay | Admitting: Internal Medicine

## 2016-08-20 DIAGNOSIS — I351 Nonrheumatic aortic (valve) insufficiency: Secondary | ICD-10-CM | POA: Diagnosis not present

## 2016-08-20 DIAGNOSIS — I9589 Other hypotension: Secondary | ICD-10-CM | POA: Diagnosis not present

## 2016-08-20 DIAGNOSIS — R0902 Hypoxemia: Secondary | ICD-10-CM | POA: Diagnosis not present

## 2016-08-20 DIAGNOSIS — I509 Heart failure, unspecified: Secondary | ICD-10-CM

## 2016-08-20 DIAGNOSIS — Z885 Allergy status to narcotic agent status: Secondary | ICD-10-CM | POA: Diagnosis not present

## 2016-08-20 DIAGNOSIS — I4892 Unspecified atrial flutter: Secondary | ICD-10-CM | POA: Diagnosis present

## 2016-08-20 DIAGNOSIS — R0602 Shortness of breath: Secondary | ICD-10-CM | POA: Diagnosis not present

## 2016-08-20 DIAGNOSIS — J810 Acute pulmonary edema: Secondary | ICD-10-CM

## 2016-08-20 DIAGNOSIS — I483 Typical atrial flutter: Secondary | ICD-10-CM

## 2016-08-20 DIAGNOSIS — Z809 Family history of malignant neoplasm, unspecified: Secondary | ICD-10-CM | POA: Diagnosis not present

## 2016-08-20 DIAGNOSIS — J45909 Unspecified asthma, uncomplicated: Secondary | ICD-10-CM | POA: Diagnosis present

## 2016-08-20 DIAGNOSIS — E871 Hypo-osmolality and hyponatremia: Secondary | ICD-10-CM | POA: Diagnosis present

## 2016-08-20 DIAGNOSIS — J9601 Acute respiratory failure with hypoxia: Secondary | ICD-10-CM | POA: Diagnosis present

## 2016-08-20 DIAGNOSIS — E118 Type 2 diabetes mellitus with unspecified complications: Secondary | ICD-10-CM | POA: Diagnosis present

## 2016-08-20 DIAGNOSIS — Z953 Presence of xenogenic heart valve: Secondary | ICD-10-CM | POA: Diagnosis not present

## 2016-08-20 DIAGNOSIS — R0600 Dyspnea, unspecified: Secondary | ICD-10-CM

## 2016-08-20 DIAGNOSIS — C9000 Multiple myeloma not having achieved remission: Secondary | ICD-10-CM | POA: Diagnosis not present

## 2016-08-20 DIAGNOSIS — D61818 Other pancytopenia: Secondary | ICD-10-CM | POA: Diagnosis not present

## 2016-08-20 DIAGNOSIS — Z9841 Cataract extraction status, right eye: Secondary | ICD-10-CM | POA: Diagnosis not present

## 2016-08-20 DIAGNOSIS — K922 Gastrointestinal hemorrhage, unspecified: Secondary | ICD-10-CM | POA: Diagnosis present

## 2016-08-20 DIAGNOSIS — I5033 Acute on chronic diastolic (congestive) heart failure: Secondary | ICD-10-CM

## 2016-08-20 DIAGNOSIS — D500 Iron deficiency anemia secondary to blood loss (chronic): Secondary | ICD-10-CM

## 2016-08-20 DIAGNOSIS — Z9842 Cataract extraction status, left eye: Secondary | ICD-10-CM | POA: Diagnosis not present

## 2016-08-20 DIAGNOSIS — I350 Nonrheumatic aortic (valve) stenosis: Secondary | ICD-10-CM

## 2016-08-20 DIAGNOSIS — G2581 Restless legs syndrome: Secondary | ICD-10-CM | POA: Diagnosis present

## 2016-08-20 DIAGNOSIS — K219 Gastro-esophageal reflux disease without esophagitis: Secondary | ICD-10-CM | POA: Diagnosis present

## 2016-08-20 DIAGNOSIS — R9431 Abnormal electrocardiogram [ECG] [EKG]: Secondary | ICD-10-CM | POA: Diagnosis not present

## 2016-08-20 DIAGNOSIS — Z9889 Other specified postprocedural states: Secondary | ICD-10-CM | POA: Diagnosis not present

## 2016-08-20 DIAGNOSIS — I110 Hypertensive heart disease with heart failure: Secondary | ICD-10-CM | POA: Diagnosis present

## 2016-08-20 DIAGNOSIS — M1990 Unspecified osteoarthritis, unspecified site: Secondary | ICD-10-CM | POA: Diagnosis present

## 2016-08-20 DIAGNOSIS — Z7984 Long term (current) use of oral hypoglycemic drugs: Secondary | ICD-10-CM | POA: Diagnosis not present

## 2016-08-20 DIAGNOSIS — G4733 Obstructive sleep apnea (adult) (pediatric): Secondary | ICD-10-CM | POA: Diagnosis present

## 2016-08-20 DIAGNOSIS — Z8249 Family history of ischemic heart disease and other diseases of the circulatory system: Secondary | ICD-10-CM | POA: Diagnosis not present

## 2016-08-20 DIAGNOSIS — Z79899 Other long term (current) drug therapy: Secondary | ICD-10-CM | POA: Diagnosis not present

## 2016-08-20 DIAGNOSIS — Z87891 Personal history of nicotine dependence: Secondary | ICD-10-CM | POA: Diagnosis not present

## 2016-08-20 LAB — CBC WITH DIFFERENTIAL/PLATELET
Basophils Absolute: 0 10*3/uL (ref 0.0–0.1)
Basophils Relative: 0 %
EOS ABS: 0 10*3/uL (ref 0.0–0.7)
EOS PCT: 1 %
HCT: 26 % — ABNORMAL LOW (ref 39.0–52.0)
Hemoglobin: 8.2 g/dL — ABNORMAL LOW (ref 13.0–17.0)
Lymphocytes Relative: 21 %
Lymphs Abs: 0.5 10*3/uL — ABNORMAL LOW (ref 0.7–4.0)
MCH: 26.9 pg (ref 26.0–34.0)
MCHC: 31.5 g/dL (ref 30.0–36.0)
MCV: 85.2 fL (ref 78.0–100.0)
MONO ABS: 0.2 10*3/uL (ref 0.1–1.0)
Monocytes Relative: 11 %
NEUTROS PCT: 67 %
Neutro Abs: 1.5 10*3/uL — ABNORMAL LOW (ref 1.7–7.7)
Platelets: 83 10*3/uL — ABNORMAL LOW (ref 150–400)
RBC: 3.05 MIL/uL — AB (ref 4.22–5.81)
RDW: 21.7 % — ABNORMAL HIGH (ref 11.5–15.5)
WBC: 2.2 10*3/uL — AB (ref 4.0–10.5)

## 2016-08-20 LAB — CBC
HCT: 24.1 % — ABNORMAL LOW (ref 39.0–52.0)
Hemoglobin: 7.3 g/dL — ABNORMAL LOW (ref 13.0–17.0)
MCH: 26.5 pg (ref 26.0–34.0)
MCHC: 30.3 g/dL (ref 30.0–36.0)
MCV: 87.6 fL (ref 78.0–100.0)
PLATELETS: 106 10*3/uL — AB (ref 150–400)
RBC: 2.75 MIL/uL — ABNORMAL LOW (ref 4.22–5.81)
RDW: 21.2 % — ABNORMAL HIGH (ref 11.5–15.5)
WBC: 1.8 10*3/uL — AB (ref 4.0–10.5)

## 2016-08-20 LAB — MRSA PCR SCREENING: MRSA by PCR: NEGATIVE

## 2016-08-20 LAB — CBG MONITORING, ED: Glucose-Capillary: 112 mg/dL — ABNORMAL HIGH (ref 65–99)

## 2016-08-20 LAB — COMPREHENSIVE METABOLIC PANEL
ALK PHOS: 143 U/L — AB (ref 38–126)
ALT: 13 U/L — AB (ref 17–63)
AST: 36 U/L (ref 15–41)
Albumin: 3.1 g/dL — ABNORMAL LOW (ref 3.5–5.0)
Anion gap: 8 (ref 5–15)
BUN: 11 mg/dL (ref 6–20)
CALCIUM: 8.7 mg/dL — AB (ref 8.9–10.3)
CHLORIDE: 100 mmol/L — AB (ref 101–111)
CO2: 24 mmol/L (ref 22–32)
CREATININE: 0.54 mg/dL — AB (ref 0.61–1.24)
GFR calc Af Amer: >60 mL/min (ref >60)
Glucose, Bld: 111 mg/dL — ABNORMAL HIGH (ref 65–99)
Potassium: 3.4 mmol/L — ABNORMAL LOW (ref 3.5–5.1)
Sodium: 132 mmol/L — ABNORMAL LOW (ref 135–145)
Total Bilirubin: 2.8 mg/dL — ABNORMAL HIGH (ref 0.3–1.2)
Total Protein: 5.5 g/dL — ABNORMAL LOW (ref 6.5–8.1)

## 2016-08-20 LAB — GLUCOSE, CAPILLARY
GLUCOSE-CAPILLARY: 131 mg/dL — AB (ref 65–99)
GLUCOSE-CAPILLARY: 100 mg/dL — AB (ref 65–99)
Glucose-Capillary: 109 mg/dL — ABNORMAL HIGH (ref 65–99)

## 2016-08-20 LAB — TROPONIN I
Troponin I: 0.04 ng/mL (ref <0.03)
Troponin I: 0.04 ng/mL (ref <0.03)

## 2016-08-20 LAB — I-STAT CG4 LACTIC ACID, ED
LACTIC ACID, VENOUS: 1.86 mmol/L (ref 0.5–1.9)
LACTIC ACID, VENOUS: 1.54 mmol/L (ref 0.5–1.9)

## 2016-08-20 LAB — PREPARE RBC (CROSSMATCH)

## 2016-08-20 MED ORDER — LORATADINE 10 MG PO TABS
10.0000 mg | ORAL_TABLET | Freq: Every day | ORAL | Status: DC
  Administered 2016-08-21 – 2016-08-22 (×2): 10 mg via ORAL
  Filled 2016-08-20 (×2): qty 1

## 2016-08-20 MED ORDER — CYCLOBENZAPRINE HCL 10 MG PO TABS: 5.0000 mg | ORAL_TABLET | Freq: Three times a day (TID) | ORAL | Status: DC | PRN

## 2016-08-20 MED ORDER — SIMETHICONE 80 MG PO CHEW
80.0000 mg | CHEWABLE_TABLET | Freq: Four times a day (QID) | ORAL | Status: DC | PRN
  Filled 2016-08-20: qty 1

## 2016-08-20 MED ORDER — VANCOMYCIN HCL IN DEXTROSE 1-5 GM/200ML-% IV SOLN
1000.0000 mg | Freq: Once | INTRAVENOUS | Status: AC
  Administered 2016-08-20: 1000 mg via INTRAVENOUS
  Filled 2016-08-20: qty 200

## 2016-08-20 MED ORDER — SODIUM CHLORIDE 0.9% FLUSH
3.0000 mL | Freq: Two times a day (BID) | INTRAVENOUS | Status: DC
  Administered 2016-08-20 – 2016-08-22 (×5): 3 mL via INTRAVENOUS

## 2016-08-20 MED ORDER — DOCUSATE SODIUM 100 MG PO CAPS: 100.0000 mg | ORAL_CAPSULE | Freq: Two times a day (BID) | ORAL | Status: DC | PRN

## 2016-08-20 MED ORDER — GABAPENTIN 400 MG PO CAPS
800.0000 mg | ORAL_CAPSULE | Freq: Every day | ORAL | Status: DC
  Administered 2016-08-20 – 2016-08-22 (×3): 800 mg via ORAL
  Filled 2016-08-20 (×3): qty 2

## 2016-08-20 MED ORDER — ENOXAPARIN SODIUM 40 MG/0.4ML ~~LOC~~ SOLN: 40.0000 mg | SUBCUTANEOUS | Status: DC

## 2016-08-20 MED ORDER — CARBIDOPA-LEVODOPA 25-100 MG PO TABS
3.0000 | ORAL_TABLET | Freq: Four times a day (QID) | ORAL | Status: DC
  Administered 2016-08-20 – 2016-08-22 (×8): 3 via ORAL
  Filled 2016-08-20 (×8): qty 3

## 2016-08-20 MED ORDER — POLYVINYL ALCOHOL 1.4 % OP SOLN
1.0000 [drp] | OPHTHALMIC | Status: DC | PRN
  Filled 2016-08-20: qty 15

## 2016-08-20 MED ORDER — FINASTERIDE 5 MG PO TABS
5.0000 mg | ORAL_TABLET | Freq: Every day | ORAL | Status: DC
  Administered 2016-08-20 – 2016-08-22 (×3): 5 mg via ORAL
  Filled 2016-08-20 (×3): qty 1

## 2016-08-20 MED ORDER — ACETAMINOPHEN 325 MG PO TABS: 650.0000 mg | ORAL_TABLET | Freq: Four times a day (QID) | ORAL | Status: DC | PRN

## 2016-08-20 MED ORDER — PANTOPRAZOLE SODIUM 40 MG PO TBEC
40.0000 mg | DELAYED_RELEASE_TABLET | Freq: Every day | ORAL | Status: DC
  Administered 2016-08-20 – 2016-08-22 (×3): 40 mg via ORAL
  Filled 2016-08-20 (×3): qty 1

## 2016-08-20 MED ORDER — SENNOSIDES-DOCUSATE SODIUM 8.6-50 MG PO TABS
1.0000 | ORAL_TABLET | Freq: Every day | ORAL | Status: DC
Start: 2016-08-20 — End: 2016-08-22
  Administered 2016-08-20 – 2016-08-22 (×3): 1 via ORAL
  Filled 2016-08-20 (×3): qty 1

## 2016-08-20 MED ORDER — CALCIUM POLYCARBOPHIL 625 MG PO TABS
1250.0000 mg | ORAL_TABLET | Freq: Every day | ORAL | Status: DC
  Administered 2016-08-21 – 2016-08-22 (×2): 1250 mg via ORAL
  Filled 2016-08-20 (×3): qty 2

## 2016-08-20 MED ORDER — METHYLCELLULOSE 1 % OP SOLN: 1.0000 [drp] | Freq: Every evening | OPHTHALMIC | Status: DC | PRN

## 2016-08-20 MED ORDER — SIMVASTATIN 20 MG PO TABS
20.0000 mg | ORAL_TABLET | Freq: Every day | ORAL | Status: DC
  Administered 2016-08-20 – 2016-08-22 (×3): 20 mg via ORAL
  Filled 2016-08-20 (×3): qty 1

## 2016-08-20 MED ORDER — INSULIN ASPART 100 UNIT/ML ~~LOC~~ SOLN
0.0000 [IU] | Freq: Three times a day (TID) | SUBCUTANEOUS | Status: DC
  Administered 2016-08-20 – 2016-08-22 (×2): 1 [IU] via SUBCUTANEOUS

## 2016-08-20 MED ORDER — POMALIDOMIDE 4 MG PO CAPS: 4.0000 mg | ORAL_CAPSULE | ORAL | Status: DC

## 2016-08-20 MED ORDER — HYDROCORTISONE 2.5 % RE CREA: 1.0000 "application " | TOPICAL_CREAM | Freq: Two times a day (BID) | RECTAL | Status: DC | PRN

## 2016-08-20 MED ORDER — FOLIC ACID 1 MG PO TABS
1.0000 mg | ORAL_TABLET | Freq: Every day | ORAL | Status: DC
  Administered 2016-08-20 – 2016-08-22 (×3): 1 mg via ORAL
  Filled 2016-08-20 (×3): qty 1

## 2016-08-20 MED ORDER — ZINC OXIDE 20 % EX OINT: 1.0000 "application " | TOPICAL_OINTMENT | Freq: Two times a day (BID) | CUTANEOUS | Status: DC | PRN

## 2016-08-20 MED ORDER — ACETAMINOPHEN 650 MG RE SUPP: 650.0000 mg | Freq: Four times a day (QID) | RECTAL | Status: DC | PRN

## 2016-08-20 MED ORDER — DIGOXIN 0.25 MG/ML IJ SOLN
0.2500 mg | Freq: Once | INTRAMUSCULAR | Status: AC
  Administered 2016-08-20: 0.25 mg via INTRAVENOUS
  Filled 2016-08-20: qty 2

## 2016-08-20 MED ORDER — SODIUM CHLORIDE 0.9 % IV SOLN
Freq: Once | INTRAVENOUS | Status: AC
  Administered 2016-08-20: 10:00:00 via INTRAVENOUS

## 2016-08-20 MED ORDER — TRAMADOL HCL 50 MG PO TABS
50.0000 mg | ORAL_TABLET | Freq: Four times a day (QID) | ORAL | Status: DC | PRN
  Administered 2016-08-21: 50 mg via ORAL
  Filled 2016-08-20: qty 1

## 2016-08-20 MED ORDER — GABAPENTIN 800 MG PO TABS: 800.0000 mg | ORAL_TABLET | Freq: Four times a day (QID) | ORAL | Status: DC

## 2016-08-20 MED ORDER — OMEGA-3 FATTY ACIDS 1000 MG PO CAPS
1.0000 g | ORAL_CAPSULE | Freq: Every day | ORAL | Status: DC
  Filled 2016-08-20 (×3): qty 1

## 2016-08-20 MED ORDER — METFORMIN HCL 500 MG PO TABS
500.0000 mg | ORAL_TABLET | Freq: Two times a day (BID) | ORAL | Status: DC
  Administered 2016-08-20 – 2016-08-22 (×5): 500 mg via ORAL
  Filled 2016-08-20 (×5): qty 1

## 2016-08-20 MED ORDER — GABAPENTIN 400 MG PO CAPS
400.0000 mg | ORAL_CAPSULE | Freq: Two times a day (BID) | ORAL | Status: DC
  Administered 2016-08-20 – 2016-08-22 (×4): 400 mg via ORAL
  Filled 2016-08-20 (×4): qty 1

## 2016-08-20 MED ORDER — PIPERACILLIN-TAZOBACTAM 3.375 G IVPB 30 MIN
3.3750 g | Freq: Once | INTRAVENOUS | Status: AC
  Administered 2016-08-20: 3.375 g via INTRAVENOUS
  Filled 2016-08-20: qty 50

## 2016-08-20 MED ORDER — FUROSEMIDE 10 MG/ML IJ SOLN
20.0000 mg | Freq: Every day | INTRAMUSCULAR | Status: DC
  Administered 2016-08-21 – 2016-08-22 (×2): 20 mg via INTRAVENOUS
  Filled 2016-08-20 (×2): qty 2

## 2016-08-20 MED ORDER — INSULIN ASPART 100 UNIT/ML ~~LOC~~ SOLN: 0.0000 [IU] | Freq: Every day | SUBCUTANEOUS | Status: DC

## 2016-08-20 NOTE — ED Notes (Signed)
Pt transferred to hospital bed for comfort.

## 2016-08-20 NOTE — ED Notes (Signed)
Breakfast tray ordered 

## 2016-08-20 NOTE — Progress Notes (Signed)
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  He recently saw his cardiologist Dr. Stanford Breed a week ago and he is Lasix was increased at that time, however feels like he continues to have progressive edema and shortness of breath and came to the hospital.  Assessment & Plan: Principal Problem:   Dyspnea Active Problems:   Pancytopenia (HCC)   Type II diabetes mellitus with complication (HCC)   Hypoxia   CHF (congestive heart failure) (HCC)   Acute hypoxic respiratory failure due to acute on chronic diastolic CHF -Initially required BiPAP, on nasal cannula this morning -Received IV Lasix, however hypotensive currently and will hold Lasix -Cardiology consulted, appreciate input -will update a 2D echo  Aortic valve replacement -Repeat a 2D echo, appreciate cardiology evaluation  Pancytopenia -In the setting of multiple myeloma, he is currently in a study at [facility], discussed with pharmacy, will hold his multiple myeloma medication  Multiple myeloma -Followed by oncology at [facility]  Chronic anemia due to chronic GI blood loss -due to AVMs, recently placed on octreotide by his GI doctor -Transfuse 1 unit of irradiated packed red blood cells  A flutter -Rate controlled, not a candidate for anticoagulation due to GI bleed  Hyponatremia -In the setting of fluid overload, monitor with diuresis    DVT prophylaxis: SCDs Code Status: Full code Family Communication: Discussed with wife at bedside Disposition Plan: Admit to  stepdown  Consultants:   Cardiology  Procedures:   2D echo: Pending  Antimicrobials:  None    Subjective: -Just moved from the bedside commode to bed, complaints of chest pressure.  Has no abdominal pain, nausea vomiting.  Complains of shortness of breath, slightly improved since coming to the emergency room  Objective: Vitals:   08/20/16 0954 08/20/16 1000 08/20/16 1049 08/20/16 1155  BP: 94/75 91/61 96/73  92/68  Pulse: 93 89 90 87  Resp: 20 18 (!) 21   Temp: 99.4 F (37.4 C)   98.9 F (37.2 C)  TempSrc: Oral   Oral  SpO2: 96% 100% 100% 99%    Intake/Output Summary (Last 24 hours) at 08/20/16 1251 Last data filed at 08/20/16 0956  Gross per 24 hour  Intake              300 ml  Output             1600 ml  Net            -1300 ml   There were no vitals filed for this visit.  Examination:  Vitals:   08/20/16 0954 08/20/16 1000 08/20/16 1049 08/20/16 1155  BP: 94/75 91/61 96/73  92/68  Pulse: 93 89 90 87  Resp: 20 18 (!) 21   Temp: 99.4 F (37.4 C)   98.9 F (37.2 C)  TempSrc: Oral   Oral  SpO2: 96% 100% 100% 99%    Constitutional: NAD, pale appearing Caucasian male Eyes: lids and conjunctivae pale Respiratory: bibasilar crackles, no wheezing, moves air well, no accessory muscle use Cardiovascular: Regular rate and rhythm, 3/6 SEM. Pitting LE edema. 2+ pedal  pulses Abdomen: no tenderness. Bowel sounds positive.  Musculoskeletal: no clubbing / cyanosis. No joint deformity upper and lower extremities. No contractures. Normal muscle tone.  Skin: no rashes, lesions, ulcers. No induration Neurologic: CN 2-12 grossly intact. Strength 5/5 in all 4.  Psychiatric: Normal judgment and insight. Alert and oriented x 3. Normal mood.    Data Reviewed: I have independently reviewed following labs and imaging studies   EKG: a flutter CT angio: no PE evident  CBC:  Recent Labs Lab 08/19/16 2036 08/20/16 0415  WBC 3.2* 1.8*  HGB 8.0* 7.3*  HCT 27.0* 24.1*  MCV  88.5 87.6  PLT 132* 423*   Basic Metabolic Panel:  Recent Labs Lab 08/19/16 2036 08/20/16 0415  NA 132* 132*  K 3.6 3.4*  CL 99* 100*  CO2 22 24  GLUCOSE 111* 111*  BUN 13 11  CREATININE 0.59* 0.54*  CALCIUM 9.0 8.7*   GFR: Estimated Creatinine Clearance: 94.7 mL/min (A) (by C-G formula based on SCr of 0.54 mg/dL (L)). Liver Function Tests:  Recent Labs Lab 08/19/16 2036 08/20/16 0415  AST 39 36  ALT 8* 13*  ALKPHOS 156* 143*  BILITOT 2.3* 2.8*  PROT 5.9* 5.5*  ALBUMIN 3.3* 3.1*   No results for input(s): LIPASE, AMYLASE in the last 168 hours. No results for input(s): AMMONIA in the last 168 hours. Coagulation Profile:  Recent Labs Lab 08/19/16 2036  INR 1.17   Cardiac Enzymes:  Recent Labs Lab 08/20/16 0415  TROPONINI <0.03   BNP (last 3 results) No results for input(s): PROBNP in the last 8760 hours. HbA1C: No results for input(s): HGBA1C in the last 72 hours. CBG:  Recent Labs Lab 08/20/16 0640 08/20/16 1224  GLUCAP 112* 131*   Lipid Profile: No results for input(s): CHOL, HDL, LDLCALC, TRIG, CHOLHDL, LDLDIRECT in the last 72 hours. Thyroid Function Tests: No results for input(s): TSH, T4TOTAL, FREET4, T3FREE, THYROIDAB in the last 72 hours. Anemia Panel: No results for input(s): VITAMINB12, FOLATE, FERRITIN, TIBC, IRON, RETICCTPCT in the last 72 hours. Urine analysis:    Component Value Date/Time   COLORURINE YELLOW 03/23/2016 0436   APPEARANCEUR CLEAR 03/23/2016 0436   LABSPEC 1.024 03/23/2016 0436   PHURINE 5.0 03/23/2016 0436   GLUCOSEU NEGATIVE 03/23/2016 0436   GLUCOSEU NEGATIVE 04/25/2012 1108   HGBUR SMALL (A) 03/23/2016 0436   BILIRUBINUR NEGATIVE 03/23/2016 0436   KETONESUR NEGATIVE 03/23/2016 0436   PROTEINUR 30 (A) 03/23/2016 0436   UROBILINOGEN 0.2 05/17/2012 1021   NITRITE NEGATIVE 03/23/2016 0436   LEUKOCYTESUR NEGATIVE 03/23/2016 0436   Sepsis Labs: Invalid input(s): PROCALCITONIN, LACTICIDVEN  Recent Results  (from the past 240 hour(s))  Blood culture (routine x 2)     Status: None (Preliminary result)   Collection Time: 08/19/16  8:40 PM  Result Value Ref Range Status   Specimen Description BLOOD RIGHT ARM  Final   Special Requests   Final    BOTTLES DRAWN AEROBIC AND ANAEROBIC Blood Culture adequate volume   Culture NO GROWTH < 12 HOURS  Final   Report Status PENDING  Incomplete  Blood culture (routine x 2)     Status: None (Preliminary result)   Collection Time: 08/19/16  8:54 PM  Result Value Ref Range Status   Specimen Description BLOOD LEFT ANTECUBITAL  Final   Special Requests   Final    BOTTLES DRAWN AEROBIC AND ANAEROBIC Blood Culture adequate volume   Culture NO GROWTH < 12 HOURS  Final   Report Status PENDING  Incomplete      Radiology Studies: Ct Angio Chest Pe W And/or Wo Contrast  Result Date: 08/19/2016 CLINICAL DATA:  Shortness of breath for 2 hours.  Elevated D-dimer. EXAM: CT ANGIOGRAPHY CHEST WITH CONTRAST TECHNIQUE: Multidetector CT imaging of the chest was performed using the standard protocol during bolus administration of intravenous contrast. Multiplanar CT image reconstructions and MIPs were obtained to evaluate the vascular anatomy. CONTRAST:  80 cc Isovue 370 IV COMPARISON:  Radiograph earlier this day.  Chest CT 08/14/2010 FINDINGS: Cardiovascular: There are no filling defects within the pulmonary arteries to suggest pulmonary embolus. There is multi chamber cardiomegaly. Prosthetic aortic valve. Thoracic aorta is normal in caliber mild aortic atherosclerosis. No evidence of dissection. There is contrast refluxing into the hepatic veins and IVC. No pericardial effusion. There are coronary artery calcifications. Mediastinum/Nodes: Small prevascular nodes measuring up to 8 mm. No hilar adenopathy. The esophagus is decompressed. Visualized thyroid gland is normal. Lungs/Pleura: Elevated right hemidiaphragm with adjacent compressive atelectasis. There is smooth septal  thickening consistent with pulmonary edema. Minimal patchy ground-glass opacity in the perihilar upper lobes also likely pulmonary edema. Mild hypoventilatory atelectasis at the lung bases. Small right pleural effusion versus perihepatic ascites, diaphragm not well-defined. No left pleural fluid. Upper Abdomen: Spleen is enlarged measuring 15.8 cm. Musculoskeletal: There are no acute or suspicious osseous abnormalities. There is a prominent Schmorl's node involving superior endplate of Z61 of uncertain acuity. The bones appear under mineralized. Post median sternotomy. Review of the MIP images confirms the above findings. IMPRESSION: 1. No pulmonary embolus. 2. Post aortic valve replacement. Cardiomegaly with contrast refluxing into the hepatic veins and IVC consistent with the liver right heart pressures. Mild pulmonary edema. 3. Mild aortic atherosclerosis and coronary artery calcifications. 4. Incidental splenomegaly in the upper abdomen. Aortic Atherosclerosis (ICD10-I70.0). Electronically Signed   By: Jeb Levering M.D.   On: 08/19/2016 23:42   Dg Chest Portable 1 View  Result Date: 08/19/2016 CLINICAL DATA:  Multiple myeloma, acute onset of shortness of breath over last 2 hours, history hypertension, GERD EXAM: PORTABLE CHEST 1 VIEW COMPARISON:  Portable exam 2039 hours compared to 03/23/2016 FINDINGS: Upper normal size of cardiac silhouette post AVR. Mediastinal contours and pulmonary vascularity normal. Chronic RIGHT basilar atelectasis. Chronic accentuation of interstitial markings little changed. No segmental consolidation, pleural effusion or pneumothorax. Bones demineralized. IMPRESSION: Chronic RIGHT basilar atelectasis. Post AVR. Electronically Signed   By: Lavonia Dana M.D.   On: 08/19/2016 20:58    Scheduled Meds: . carbidopa-levodopa  3 tablet Oral QID  . enoxaparin (LOVENOX) injection  40 mg Subcutaneous Q24H  . finasteride  5 mg Oral Daily  . fish oil-omega-3 fatty acids  1 g Oral  Daily  . folic acid  1 mg Oral Daily  . furosemide  20 mg Intravenous Daily  . gabapentin  800 mg Oral QID  . insulin aspart  0-5 Units Subcutaneous QHS  . insulin aspart  0-9 Units Subcutaneous TID WC  . loratadine  10 mg Oral Daily  . metFORMIN  500 mg Oral BID WC  . pantoprazole  40 mg Oral Daily  . polycarbophil  1,250 mg Oral Daily  . pomalidomide  4 mg Oral See admin instructions  . senna-docusate  1 tablet Oral Daily  . simvastatin  20 mg Oral Daily  . sodium chloride flush  3 mL Intravenous Q12H   Continuous Infusions:   Marzetta Board, MD, PhD [facility] Pager [phone]  If 7PM-7AM, please contact night-coverage [email or web address]  Password [email or web address] 08/20/2016, 12:51 PM

## 2016-08-20 NOTE — H&P (Signed)
TRH H&P   Patient Demographics:    [name], is a 71 y.o. male  MRN: [id number]   DOB - [birth year]  Admit Date - 08/19/2016  Outpatient Primary MD for the patient is Buzzy Han, MD  Referring MD/NP/PA: Pattricia Boss  Outpatient Specialists:   Patient coming from:   home  Chief Complaint  Patient presents with  . Shortness of Breath      HPI:    [name]  is a 71 y.o. male, w c/o dyspnea Sunday for about  15 minutes.  And then recurred today. Denies fever, chills, cough, cp, palp, n/v, diarrhea.  Pt had blood transfusion 7/13.  Pt presents due to increase in dyspnea.    In ED  Po 89% on RA,  Pt had CTA chest => mild pulmonary edema.   No PE.  Wbc 3.2, Hgb 8.0, Plt 132  Pt will be admitted for mild chf.    Review of systems:    In addition to the HPI above,  No Fever-chills, No Headache, No changes with Vision or hearing, No problems swallowing food or Liquids, No Chest pain, Cough  No Abdominal pain, No Nausea or Vommitting, Bowel movements are regular, No Blood in stool or Urine, No dysuria, No new skin rashes or bruises, No new joints pains-aches,  No new weakness, tingling, numbness in any extremity, No recent weight gain or loss, No polyuria, polydypsia or polyphagia, No significant Mental Stressors.  A full 10 point Review of Systems was done, except as stated above, all other Review of Systems were negative.   With Past History of the following :    Past Medical History:  Diagnosis Date  . Allergic rhinitis   . Anemia 2009  . Aortic stenosis    a. s/p porcine AVR 2006;  b. s/p redo tissue AVR 12/2011 (Dr. Roxy Manns) - preAVR LHC with no CAD  . Asthma   . BPH (benign prostatic hypertrophy)   . Cancer ([place])    multiple myeloma  . Cataract   . Chronic cough   . Chronic interstitial cystitis   . Depression    pt denies  .  Diabetes mellitus    type 2  . Diabetes mellitus without complication ([place])   . Diverticulosis of colon    on colonoscopy 2008  . GERD (gastroesophageal reflux disease)    bravo pH study 2008  . Gout   . H/O aortic valve replacement with porcine valve    2006, 2013  . Headache(784.0)   . Hemorrhoids    external and internal  . Hiatal hernia   . HTN (hypertension)   . Hx of echocardiogram    a. Echo  (post AVR) 12/2011:  mod LVH, EF 60-65%, Gr 2 diast dysfn, mild AI, AVR ok (mean gradient 19 mmHg), MAC, mild BAE  . Hyperlipidemia   . Hypertension   . Hyperthyroidism   .  IBS (irritable bowel syndrome)   . Insomnia   . Lower GI bleed 07/16/2015  . OA (osteoarthritis)   . OSA (obstructive sleep apnea)    USES CPAP AS NEEDED  . 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 Periodontitis    chronic with bone loss  . Restless leg syndrome   . S/P aortic valve replacement with bioprosthetic valve 12/06/2011   Redo AVR using 23 mm [facility] Ease pericardial tissue valve      Past Surgical History:  Procedure Laterality Date  . AORTA - FEMORAL ARTERY BYPASS GRAFT    . AORTIC VALVE REPLACEMENT  10/13/2004   48m [place]imount pericardial tissue valve  . AORTIC VALVE REPLACEMENT  12/06/2011   Procedure: REDO AORTIC VALVE REPLACEMENT (AVR);  Surgeon: CRexene Alberts MD;  [facility]  Service: Open Heart Surgery;  Laterality: N/A;  . BUNIONECTOMY     right  . CARDIAC SURGERY     aorta vavle replacement  . CATARACT EXTRACTION  2009    &   2012   BILATERAL  . COLONOSCOPY  08/31/2011   Procedure: COLONOSCOPY;  Surgeon: RInda Castle MD;  [facility]  Service: Endoscopy;  Laterality: N/A;  . ESOPHAGOGASTRODUODENOSCOPY  08/30/2011   Procedure: ESOPHAGOGASTRODUODENOSCOPY (EGD);  Surgeon: RInda Castle MD;  [facility]  Service: Endoscopy;  Laterality: N/A;  Rm 3005   . HERNIA REPAIR    . JOINT REPLACEMENT     knee  . LEFT HEART  CATHETERIZATION WITH CORONARY ANGIOGRAM N/A 11/30/2011   Procedure: LEFT HEART CATHETERIZATION WITH CORONARY ANGIOGRAM;  Surgeon: CBurnell Blanks MD;  [facility];  Service: Cardiovascular;  Laterality: N/A;  . NASAL SEPTOPLASTY W/ TURBINOPLASTY    . REFRACTIVE SURGERY     bilateral  . RIGHT HEART CATHETERIZATION Bilateral 11/30/2011   Procedure: RIGHT HEART CATH;  Surgeon: CBurnell Blanks MD;  [facility];  Service: Cardiovascular;  Laterality: Bilateral;  . right knee arthroscopy    . TEE WITHOUT CARDIOVERSION N/A 10/27/2015   Procedure: TRANSESOPHAGEAL ECHOCARDIOGRAM (TEE);  Surgeon: BLelon Perla MD;  [facility];  Service: Cardiovascular;  Laterality: N/A;      Social History:     Social History  Substance Use Topics  . Smoking status: Former Smoker    Quit date: 09/30/1971  . Smokeless tobacco: Never Used     Comment: Quit August 973  . Alcohol use No     Lives - at home w wife  Mobility -  Walks by self using cane   Family History :     Family History  Problem Relation Age of Onset  . Heart disease Father   . Osteoarthritis Mother   . Hypertension Sister   . Hyperlipidemia Unknown   . Cancer Maternal Aunt      Home Medications:   Prior to Admission medications   Medication Sig Start Date End Date Taking? Authorizing Provider  Acetaminophen 500 MG coapsule Take 500 mg by mouth every 6 (six) hours as needed for fever.   Yes [provider]  carbidopa-levodopa (SINEMET IR) 25-100 MG tablet Take 3 tablets by mouth 4 (four) times daily.    Yes [provider]  cetirizine (ZYRTEC) 10 MG tablet Take 10 mg by mouth daily.   Yes [provider]  dexamethasone (DECADRON) 4 MG tablet Take 10 mg by mouth every 7 (seven) days. days 1, 8, 15 and 22 of 28 day cycle.  11/19/15  Yes [provider]  diazepam (VALIUM) 2 MG tablet Take 2 mg by mouth at bedtime as needed for anxiety.    Yes  [provider]  docusate sodium (COLACE) 100 MG capsule Take 100 mg by mouth 2 (two) times daily as needed for moderate constipation.  08/20/15  Yes [provider]  finasteride (PROSCAR) 5 MG tablet Take 5 mg by mouth daily.   Yes [provider]  fish oil-omega-3 fatty acids 1000 MG capsule Take 1 g by mouth daily.    Yes [provider]  folic acid (FOLVITE) 1 MG tablet Take 1 mg by mouth daily. 09/24/15  Yes [provider]  furosemide (LASIX) 40 MG tablet Take 1.5 tablets (60 mg total) by mouth daily. 08/15/16  Yes Lelon Perla, MD  gabapentin (NEURONTIN) 800 MG tablet Take 800 mg by mouth 4 (four) times daily. Take 1/2 tablet every morning and at bedtime then take 1 tablet at lunch   Yes [provider]  metFORMIN (GLUCOPHAGE) 500 MG tablet Take 500 mg by mouth 2 (two) times daily with a meal.   Yes [provider]  methylcellulose (ARTIFICIAL TEARS) 1 % ophthalmic solution Place 1 drop into both eyes at bedtime as needed (dry eyes).   Yes [provider]  octreotide (SANDOSTATIN) 100 MCG/ML SOLN injection Inject 100 mcg into the skin every 12 (twelve) hours. .25 mL twice daily for 14 days---Starting 08/10/16   Yes [provider]  pantoprazole (PROTONIX) 40 MG tablet Take 1 tablet (40 mg total) by mouth daily. For GERD 07/16/15  Yes Rai, Ripudeep K, MD  polycarbophil (FIBERCON) 625 MG tablet Take 2 tablets by mouth daily.   Yes [provider]  pomalidomide (POMALYST) 4 MG capsule Take 4 mg by mouth See admin instructions. Take with water on days 1-21 then  PATIENT IS OFF MED FOR 7 DAYS, Repeat every 28 days.   Yes [provider]  senna-docusate (SENOKOT-S) 8.6-50 MG tablet Take 1 tablet by mouth daily.   Yes [provider]  simvastatin (ZOCOR) 40 MG tablet Take 20 mg by mouth daily. 09/24/15  Yes [provider]  cyclobenzaprine (FLEXERIL) 10 MG tablet Take 5 mg by mouth 3  (three) times daily as needed for muscle spasms.    [provider]  HYDROcodone-acetaminophen (NORCO/VICODIN) 5-325 MG tablet Take 1 tablet by mouth every 6 (six) hours as needed for moderate pain.    [provider]  hydrocortisone (ANUSOL-HC) 2.5 % rectal cream Place 1 application rectally 2 (two) times daily.    [provider]  ketoconazole (NIZORAL) 2 % cream Apply 1 application topically daily as needed for irritation.    [provider]  simethicone (MYLICON) 80 MG chewable tablet Chew 80 mg by mouth every 6 (six) hours as needed for flatulence.    [provider]  traMADol (ULTRAM) 50 MG tablet Take 50 mg by mouth every 6 (six) hours as needed for moderate pain.    [provider]  zinc oxide 20 % ointment Apply 1 application topically 2 (two) times daily as needed for irritation.    [provider]     Allergies:     Allergies  Allergen Reactions  . Avodart [Dutasteride] Other (See Comments)    Lose of use of arms and legs   . Spironolactone Other (See Comments)    Low tolerance   . Codeine Other (See Comments)    GI upset in large doses  .  Diazepam Other (See Comments)    Mood changes   . Doxazosin Other (See Comments)    Dizziness   . Feraheme [Ferumoxytol] Swelling    Pedal edema     Physical Exam:   Vitals  Blood pressure 108/78, pulse (!) 53, resp. rate 18, SpO2 100 %.   1. General  lying in bed in NAD,    2. Normal affect and insight, Not Suicidal or Homicidal, Awake Alert, Oriented X 3.  3. No F.N deficits, ALL C.Nerves Intact, Strength 5/5 all 4 extremities, Sensation intact all 4 extremities, Plantars down going.  4. Ears and Eyes appear Normal, Conjunctivae clear, PERRLA. Moist Oral Mucosa.  5. Supple Neck, No JVD, No cervical lymphadenopathy appriciated, No Carotid Bruits.  6. Symmetrical Chest wall movement, Good air movement bilaterally,  Slight crackles right > left base, no  wheezing.    7. RRR, No Gallops, Rubs or Murmurs, No Parasternal Heave.  8. Positive Bowel Sounds, Abdomen Soft, No tenderness, No organomegaly appriciated,No rebound -guarding or rigidity.  9.  No Cyanosis, Normal Skin Turgor, No Skin Rash or Bruise.  10. Good muscle tone,  joints appear normal , no effusions, Normal ROM.  11. No Palpable Lymph Nodes in Neck or Axillae     Data Review:    CBC  Recent Labs Lab 08/19/16 2036  WBC 3.2*  HGB 8.0*  HCT 27.0*  PLT 132*  MCV 88.5  MCH 26.2  MCHC 29.6*  RDW 21.1*   ------------------------------------------------------------------------------------------------------------------  Chemistries   Recent Labs Lab 08/19/16 2036  NA 132*  K 3.6  CL 99*  CO2 22  GLUCOSE 111*  BUN 13  CREATININE 0.59*  CALCIUM 9.0  AST 39  ALT 8*  ALKPHOS 156*  BILITOT 2.3*   ------------------------------------------------------------------------------------------------------------------ estimated creatinine clearance is 94.7 mL/min (A) (by C-G formula based on SCr of 0.59 mg/dL (L)). ------------------------------------------------------------------------------------------------------------------ No results for input(s): TSH, T4TOTAL, T3FREE, THYROIDAB in the last 72 hours.  Invalid input(s): FREET3  Coagulation profile  Recent Labs Lab 08/19/16 2036  INR 1.17   -------------------------------------------------------------------------------------------------------------------  Recent Labs  08/19/16 2036  DDIMER 0.82*   -------------------------------------------------------------------------------------------------------------------  Cardiac Enzymes No results for input(s): CKMB, TROPONINI, MYOGLOBIN in the last 168 hours.  Invalid input(s): CK ------------------------------------------------------------------------------------------------------------------    Component Value Date/Time   BNP 368.0 (H) 08/19/2016 2036       ---------------------------------------------------------------------------------------------------------------  Urinalysis    Component Value Date/Time   COLORURINE YELLOW 03/23/2016 0436   APPEARANCEUR CLEAR 03/23/2016 0436   LABSPEC 1.024 03/23/2016 0436   PHURINE 5.0 03/23/2016 0436   GLUCOSEU NEGATIVE 03/23/2016 0436   GLUCOSEU NEGATIVE 04/25/2012 1108   HGBUR SMALL (A) 03/23/2016 0436   BILIRUBINUR NEGATIVE 03/23/2016 0436   KETONESUR NEGATIVE 03/23/2016 0436   PROTEINUR 30 (A) 03/23/2016 0436   UROBILINOGEN 0.2 05/17/2012 1021   NITRITE NEGATIVE 03/23/2016 0436   LEUKOCYTESUR NEGATIVE 03/23/2016 0436    ----------------------------------------------------------------------------------------------------------------   Imaging Results:    Ct Angio Chest Pe W And/or Wo Contrast  Result Date: 08/19/2016 CLINICAL DATA:  Shortness of breath for 2 hours.  Elevated D-dimer. EXAM: CT ANGIOGRAPHY CHEST WITH CONTRAST TECHNIQUE: Multidetector CT imaging of the chest was performed using the standard protocol during bolus administration of intravenous contrast. Multiplanar CT image reconstructions and MIPs were obtained to evaluate the vascular anatomy. CONTRAST:  80 cc Isovue 370 IV COMPARISON:  Radiograph earlier this day.  Chest CT 08/14/2010 FINDINGS: Cardiovascular: There are no filling defects within the pulmonary arteries to suggest pulmonary embolus. There  is multi chamber cardiomegaly. Prosthetic aortic valve. Thoracic aorta is normal in caliber mild aortic atherosclerosis. No evidence of dissection. There is contrast refluxing into the hepatic veins and IVC. No pericardial effusion. There are coronary artery calcifications. Mediastinum/Nodes: Small prevascular nodes measuring up to 8 mm. No hilar adenopathy. The esophagus is decompressed. Visualized thyroid gland is normal. Lungs/Pleura: Elevated right hemidiaphragm with adjacent compressive atelectasis. There is smooth septal  thickening consistent with pulmonary edema. Minimal patchy ground-glass opacity in the perihilar upper lobes also likely pulmonary edema. Mild hypoventilatory atelectasis at the lung bases. Small right pleural effusion versus perihepatic ascites, diaphragm not well-defined. No left pleural fluid. Upper Abdomen: Spleen is enlarged measuring 15.8 cm. Musculoskeletal: There are no acute or suspicious osseous abnormalities. There is a prominent Schmorl's node involving superior endplate of E01 of uncertain acuity. The bones appear under mineralized. Post median sternotomy. Review of the MIP images confirms the above findings. IMPRESSION: 1. No pulmonary embolus. 2. Post aortic valve replacement. Cardiomegaly with contrast refluxing into the hepatic veins and IVC consistent with the liver right heart pressures. Mild pulmonary edema. 3. Mild aortic atherosclerosis and coronary artery calcifications. 4. Incidental splenomegaly in the upper abdomen. Aortic Atherosclerosis (ICD10-I70.0). Electronically Signed   By: Jeb Levering M.D.   On: 08/19/2016 23:42   Dg Chest Portable 1 View  Result Date: 08/19/2016 CLINICAL DATA:  Multiple myeloma, acute onset of shortness of breath over last 2 hours, history hypertension, GERD EXAM: PORTABLE CHEST 1 VIEW COMPARISON:  Portable exam 2039 hours compared to 03/23/2016 FINDINGS: Upper normal size of cardiac silhouette post AVR. Mediastinal contours and pulmonary vascularity normal. Chronic RIGHT basilar atelectasis. Chronic accentuation of interstitial markings little changed. No segmental consolidation, pleural effusion or pneumothorax. Bones demineralized. IMPRESSION: Chronic RIGHT basilar atelectasis. Post AVR. Electronically Signed   By: Lavonia Dana M.D.   On: 08/19/2016 20:58      Assessment & Plan:    Principal Problem:   Dyspnea Active Problems:   Pancytopenia (HCC)   Type II diabetes mellitus with complication (HCC)   Dyspnea, Hypoxia secondary to acute CHF  exacerbation Initially required bipap => 100%nrb tx with lasix iv Strict I and o  Daily weight  Hyponatremia Check cmp in am  Abnormal lft (elevation in alk phos) Check ggt  Dm2 fsbs ac and qhs, iss  Pancytopenia Check cbc, in am  Myeloma Outpatient f/u   DVT Prophylaxis  SCDs   AM Labs Ordered, also please review Full Orders  Family Communication: Admission, patients condition and plan of care including tests being ordered have been discussed with the patient who indicate understanding and agree with the plan and Code Status.  Code Status   Likely DC to    Condition GUARDED    Consults called:   Admission status: observation  Time spent in minutes : 45    Jani Gravel M.D on 08/20/2016 at 12:44 AM  Between 7am to 7pm - Pager - [phone] After 7pm go to [email or web address] - password [facility]  [facility] - Office  [phone]

## 2016-08-20 NOTE — ED Notes (Signed)
Admitting MD at bedside.

## 2016-08-20 NOTE — ED Notes (Signed)
Admitting MD aware of patient's hypotension. Requested pt's admission bed be changed to step-down.

## 2016-08-20 NOTE — Consult Note (Signed)
Cardiology Consultation:   Patient ID: [id number] DYAS; [id number]; [birth year]   Admit date: 08/19/2016 Date of Consult: 08/20/2016  Primary Care Provider: Buzzy Han, MD Primary Cardiologist: Stanford Breed   Patient Profile:   [name] is a 71 y.o. male with a hx of Aortic valve stenosis and perivalvular regurgitation (redo AVR bioprosthesis), chronic atrial flutter and severe chronic anemia who is being seen today for the evaluation of congestive heart failure and hypotension at the request of Dr. 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 He has preserved left ventricular systolic function and had normal coronary arteries and previous angiography. He has a mean aortic prosthetic valve gradient of 31 mmHg and moderate perivalvular AI.   He saw his primary cardiologist, Dr. Stanford Breed on Monday and his dose of furosemide was increased. He noticed only mediocre improvement in urine output. He has leg edema. He does not monitor his weight. On arrival he was mildly hypoxic and hypotensive. His chest x-ray showed pulmonary edema. He was admitted for diuresis, limited by his hypotension. He is currently receiving a blood transfusion and his blood pressure has improved to 104/70. His heart rate is approximately 90 with an irregular rhythm. He is a little tachypneic but is lying almost horizontal in bed  He reports receiving one or 2 units of blood virtually weekly for the last year. In February he had cough syncope, during an episode of suspected sepsis. His hemoglobin was as low as 4.7.   He has been in atrial flutter for the last couple of months, but is unable to receive anticoagulation due to  his GI bleeding. Since starting treatment with octreotide his stool has turned from black to brown and they were hoping that he is no longer losing as much blood.    Past Medical History:  Diagnosis Date  . Allergic rhinitis   . Anemia 2009  . Aortic stenosis    a. s/p porcine AVR 2006;  b. s/p redo tissue AVR 12/2011 (Dr. Roxy Manns) - preAVR LHC with no CAD  . Asthma   . BPH (benign prostatic hypertrophy)   . Cancer ([place])    multiple myeloma  . Cataract   . Chronic cough   . Chronic interstitial cystitis   . Depression    pt denies  . Diabetes mellitus    type 2  . Diabetes mellitus without complication ([place])   . Diverticulosis of colon    on colonoscopy 2008  . GERD (gastroesophageal reflux disease)    bravo pH study 2008  . Gout   . H/O aortic valve replacement with porcine valve    2006, 2013  . Headache(784.0)   . Hemorrhoids    external and internal  . Hiatal hernia   . HTN (hypertension)   . Hx of echocardiogram    a. Echo  (post AVR) 12/2011:  mod LVH, EF 60-65%, Gr 2 diast dysfn, mild AI, AVR ok (mean gradient 19 mmHg), MAC, mild BAE  . Hyperlipidemia   . Hypertension   . Hyperthyroidism   . IBS (irritable bowel syndrome)   . Insomnia   . Lower GI bleed 07/16/2015  . OA (osteoarthritis)   . OSA (obstructive sleep apnea)    USES CPAP AS NEEDED  . Paravalvular leak  of prosthetic heart valve 10/27/2015  . Periodontitis    chronic with bone loss  . Restless leg syndrome   . S/P aortic valve replacement with bioprosthetic valve 12/06/2011   Redo AVR using 23 mm [facility] Ease pericardial tissue valve    Past Surgical History:  Procedure Laterality Date  . AORTA - FEMORAL ARTERY BYPASS GRAFT    . AORTIC VALVE REPLACEMENT  10/13/2004   36m [employer] Perimount pericardial tissue valve  . AORTIC VALVE REPLACEMENT  12/06/2011   Procedure: REDO AORTIC VALVE REPLACEMENT (AVR);  Surgeon: CRexene Alberts MD;  [facility]  Service: Open Heart Surgery;   Laterality: N/A;  . BUNIONECTOMY     right  . CARDIAC SURGERY     aorta vavle replacement  . CATARACT EXTRACTION  2009    &   2012   BILATERAL  . COLONOSCOPY  08/31/2011   Procedure: COLONOSCOPY;  Surgeon: RInda Castle MD;  [facility]  Service: Endoscopy;  Laterality: N/A;  . ESOPHAGOGASTRODUODENOSCOPY  08/30/2011   Procedure: ESOPHAGOGASTRODUODENOSCOPY (EGD);  Surgeon: RInda Castle MD;  [facility]  Service: Endoscopy;  Laterality: N/A;  Rm 3005   . HERNIA REPAIR    . JOINT REPLACEMENT     knee  . LEFT HEART CATHETERIZATION WITH CORONARY ANGIOGRAM N/A 11/30/2011   Procedure: LEFT HEART CATHETERIZATION WITH CORONARY ANGIOGRAM;  Surgeon: CBurnell Blanks MD;  [facility];  Service: Cardiovascular;  Laterality: N/A;  . NASAL SEPTOPLASTY W/ TURBINOPLASTY    . REFRACTIVE SURGERY     bilateral  . RIGHT HEART CATHETERIZATION Bilateral 11/30/2011   Procedure: RIGHT HEART CATH;  Surgeon: CBurnell Blanks MD;  [facility];  Service: Cardiovascular;  Laterality: Bilateral;  . right knee arthroscopy    . TEE WITHOUT CARDIOVERSION N/A 10/27/2015   Procedure: TRANSESOPHAGEAL ECHOCARDIOGRAM (TEE);  Surgeon: BLelon Perla MD;  [facility];  Service: Cardiovascular;  Laterality: N/A;     Inpatient Medications: Scheduled Meds: . carbidopa-levodopa  3 tablet Oral QID  . enoxaparin (LOVENOX) injection  40 mg Subcutaneous Q24H  . finasteride  5 mg Oral Daily  . fish oil-omega-3 fatty acids  1 g Oral Daily  . folic acid  1 mg Oral Daily  . furosemide  20 mg Intravenous Daily  . gabapentin  800 mg Oral QID  . insulin aspart  0-5 Units Subcutaneous QHS  . insulin aspart  0-9 Units Subcutaneous TID WC  . loratadine  10 mg Oral Daily  . metFORMIN  500 mg Oral BID WC  . pantoprazole  40 mg Oral Daily  . polycarbophil  1,250 mg Oral Daily  . pomalidomide  4 mg Oral See admin instructions  . senna-docusate  1 tablet Oral  Daily  . simvastatin  20 mg Oral Daily  . sodium chloride flush  3 mL Intravenous Q12H   Continuous Infusions:  PRN Meds: acetaminophen **OR** acetaminophen, cyclobenzaprine, docusate sodium, hydrocortisone, methylcellulose, simethicone, traMADol, zinc oxide  Allergies:    Allergies  Allergen Reactions  . Avodart [Dutasteride] Other (See Comments)    Lose of use of arms and legs   . Spironolactone Other (See Comments)    Low tolerance   . Codeine Other (See Comments)    GI upset in large doses  . Diazepam Other (See Comments)    Mood changes   . Doxazosin Other (See Comments)    Dizziness   . Feraheme [Ferumoxytol] Swelling    Pedal edema  Social History:   Social History   Social History  . Marital status: Married    Spouse name: N/A  . Number of children: 0  . Years of education: N/A   Occupational History  . retired    Social History Main Topics  . Smoking status: Former Smoker    Quit date: 09/30/1971  . Smokeless tobacco: Never Used     Comment: Quit August 973  . Alcohol use No  . Drug use: No  . Sexual activity: Not Currently   Other Topics Concern  . Not on file   Social History Narrative   ** Merged History Encounter **        Family History:   The patient's family history includes Cancer in his maternal aunt; Heart disease in his father; Hyperlipidemia in his unknown relative; Hypertension in his sister; Osteoarthritis in his mother.  ROS:  Please see the history of present illness.  ROS  All other ROS reviewed and negative.     Physical Exam/Data:   Vitals:   08/20/16 0932 08/20/16 0954 08/20/16 1000 08/20/16 1049  BP: (!) _0 96/73  Pulse: 87 93 89 90  Resp: _1 (!) 21  Temp: 99.3 F (37.4 C) 99.4 F (37.4 C)    TempSrc: Oral Oral    SpO2: 100% 96% 100% 100%  Unable to stand for weight  Intake/Output Summary (Last 24 hours) at 08/20/16 1125 Last data filed at 08/20/16 0956  Gross per 24 hour  Intake               300 ml  Output             1600 ml  Net            -1300 ml   General:  Well nourished, well developed, in no acute distress appears pale and looks chronically ill HEENT: normal Lymph: no adenopathy Neck: 6-7 centimeter JVD Endocrine:  No thryomegaly Vascular: No carotid bruits; FA pulses 2+ bilaterally without bruits  Cardiac:  normal S1, S2; RRR; 3/6 early to mid peaking systolic ejection murmur heard throughout the precordium, loudest at the left upper sternal border, I do not appreciate diastolic murmurs. Lungs:  clear to auscultation bilaterally, no wheezing, rhonchi or rales  Abd: soft, nontender, no hepatomegaly  Ext: 1+ symmetrical ankle edema Musculoskeletal:  No deformities, BUE and BLE strength normal and equal Skin: warm and dry  Neuro:  CNs 2-12 intact, no focal abnormalities noted Psych:  Normal affect   EKG:  The EKG was personally reviewed and demonstrates: typical atrial flutter with 2:1 AV block and ventricular rate of 117 bpm incomplete right bundle branch block, no ischemic repolarization changes Telemetry:  Telemetry was personally reviewed and demonstrates:  Atrial flutter with variable AV block  Relevant CV Studies: Echo 01/12/2016 - Left ventricle: The cavity size was normal. Wall thickness was   increased in a pattern of moderate LVH. There was mild focal   basal hypertrophy of the septum. Systolic function was vigorous.   The estimated ejection fraction was in the range of 65% to 70%.   Wall motion was normal; there were no regional wall motion   abnormalities. Features are consistent with a pseudonormal left   ventricular filling pattern, with concomitant abnormal relaxation   and increased filling pressure (grade 2 diastolic dysfunction).   Doppler parameters are consistent with high ventricular filling   pressure. - Aortic valve: A bioprosthesis was present. There was moderate  stenosis. There was mild regurgitation. - Mitral valve: Calcified  annulus. - Left atrium: The atrium was severely dilated. - Right ventricle: The cavity size was mildly dilated. - Right atrium: The atrium was mildly dilated. - Pulmonary arteries: Systolic pressure was mildly to moderately   increased.  Impressions:  - Normal LV systolic function; grade 2 diastolic dysfunction with   elevated LV filling pressure; moderate LVH; bioprosthetic aortic   valve with elevated mean gradient (30 mmHg); mild AI; severe LAE;   mild RAE and RVE; mild TR with mild to moderate elevation in   pulmonary pressure.  TEE 10/27/2015 - Left ventricle: There was focal basal hypertrophy. Systolic   function was normal. The estimated ejection fraction was in the   range of 55% to 60%. Wall motion was normal; there were no   regional wall motion abnormalities. - Aortic valve: A bioprosthesis was present. There was moderate   stenosis. There was moderate regurgitation. - Mitral valve: No evidence of vegetation. There was mild   regurgitation. - Left atrium: The atrium was mildly dilated. No evidence of   thrombus in the atrial cavity or appendage. - Right ventricle: The cavity size was mildly dilated. - Right atrium: The atrium was mildly dilated. No evidence of   thrombus in the atrial cavity or appendage. - Atrial septum: There was a patent foramen ovale. There was an   atrial septal aneurysm. - Tricuspid valve: No evidence of vegetation.  Impressions:  - Normal LV systolic function; bioprosthetic aortic valve with   elevated mean gradient (31 mmHg) and moderate AI that appears   paravalvular; mild MR; mild LAE, RAE and RVE; mild TR.  Laboratory Data:  Chemistry Recent Labs Lab 08/19/16 2036 08/20/16 0415  NA 132* 132*  K 3.6 3.4*  CL 99* 100*  CO2 22 24  GLUCOSE 111* 111*  BUN 13 11  CREATININE 0.59* 0.54*  CALCIUM 9.0 8.7*  GFRNONAA >60 >60  GFRAA >60 >60  ANIONGAP 11 8     Recent Labs Lab 08/19/16 2036 08/20/16 0415  PROT 5.9* 5.5*    ALBUMIN 3.3* 3.1*  AST 39 36  ALT 8* 13*  ALKPHOS 156* 143*  BILITOT 2.3* 2.8*   Hematology Recent Labs Lab 08/19/16 2036 08/20/16 0415  WBC 3.2* 1.8*  RBC 3.05* 2.75*  HGB 8.0* 7.3*  HCT 27.0* 24.1*  MCV 88.5 87.6  MCH 26.2 26.5  MCHC 29.6* 30.3  RDW 21.1* 21.2*  PLT 132* 106*   Cardiac Enzymes Recent Labs Lab 08/20/16 0415  TROPONINI <0.03    Recent Labs Lab 08/19/16 2044  TROPIPOC 0.02    BNP Recent Labs Lab 08/19/16 2036  BNP 368.0*    DDimer  Recent Labs Lab 08/19/16 2036  DDIMER 0.82*    Radiology/Studies:  Ct Angio Chest Pe W And/or Wo Contrast  Result Date: 08/19/2016 CLINICAL DATA:  Shortness of breath for 2 hours.  Elevated D-dimer. EXAM: CT ANGIOGRAPHY CHEST WITH CONTRAST TECHNIQUE: Multidetector CT imaging of the chest was performed using the standard protocol during bolus administration of intravenous contrast. Multiplanar CT image reconstructions and MIPs were obtained to evaluate the vascular anatomy. CONTRAST:  80 cc Isovue 370 IV COMPARISON:  Radiograph earlier this day.  Chest CT 08/14/2010 FINDINGS: Cardiovascular: There are no filling defects within the pulmonary arteries to suggest pulmonary embolus. There is multi chamber cardiomegaly. Prosthetic aortic valve. Thoracic aorta is normal in caliber mild aortic atherosclerosis. No evidence of dissection. There is contrast refluxing into the hepatic veins  and IVC. No pericardial effusion. There are coronary artery calcifications. Mediastinum/Nodes: Small prevascular nodes measuring up to 8 mm. No hilar adenopathy. The esophagus is decompressed. Visualized thyroid gland is normal. Lungs/Pleura: Elevated right hemidiaphragm with adjacent compressive atelectasis. There is smooth septal thickening consistent with pulmonary edema. Minimal patchy ground-glass opacity in the perihilar upper lobes also likely pulmonary edema. Mild hypoventilatory atelectasis at the lung bases. Small right pleural effusion  versus perihepatic ascites, diaphragm not well-defined. No left pleural fluid. Upper Abdomen: Spleen is enlarged measuring 15.8 cm. Musculoskeletal: There are no acute or suspicious osseous abnormalities. There is a prominent Schmorl's node involving superior endplate of O03 of uncertain acuity. The bones appear under mineralized. Post median sternotomy. Review of the MIP images confirms the above findings. IMPRESSION: 1. No pulmonary embolus. 2. Post aortic valve replacement. Cardiomegaly with contrast refluxing into the hepatic veins and IVC consistent with the liver right heart pressures. Mild pulmonary edema. 3. Mild aortic atherosclerosis and coronary artery calcifications. 4. Incidental splenomegaly in the upper abdomen. Aortic Atherosclerosis (ICD10-I70.0). Electronically Signed   By: Jeb Levering M.D.   On: 08/19/2016 23:42   Dg Chest Portable 1 View  Result Date: 08/19/2016 CLINICAL DATA:  Multiple myeloma, acute onset of shortness of breath over last 2 hours, history hypertension, GERD EXAM: PORTABLE CHEST 1 VIEW COMPARISON:  Portable exam 2039 hours compared to 03/23/2016 FINDINGS: Upper normal size of cardiac silhouette post AVR. Mediastinal contours and pulmonary vascularity normal. Chronic RIGHT basilar atelectasis. Chronic accentuation of interstitial markings little changed. No segmental consolidation, pleural effusion or pneumothorax. Bones demineralized. IMPRESSION: Chronic RIGHT basilar atelectasis. Post AVR. Electronically Signed   By: Lavonia Dana M.D.   On: 08/19/2016 20:58    Assessment and Plan:   1. Acute on chronic diastolic heart failure: Related to anemia and aortic stenosis. It is not unreasonable to reevaluate his aortic valve gradients by echo. He has had remarkably rapid deterioration in the function of his prosthetic valve. 2. Anemia: Multifactorial, related to chronic GI blood loss, possible bone marrow suppression from multiple myeloma, possible hemolysis related to  paravalvular leak. His bilirubin is only mildly elevated, and this could also be explained by rapid hemolysis of frequently transfused blood. Schistocytes not reported on previous review of blood smear. Recent ferritin is only 16, iron saturation was only 3%. The dominant problem appears to be iron deficiency secondary to chronic GI blood loss. Agree with transfusion, will need additional diuretic following the transfusion. 3. AS: This is only moderate by both transthoracic and TEE evaluation. Remember need for endocarditis prophylaxis with invasive procedures 4. A Flutter: Currently rate control appears to be adequate, with the patient at rest. Despite increased risk of embolic events, anticoagulation is not possible with chronic GI blood loss. Consider amiodarone therapy to provide rate control and possible return to sinus rhythm, acknowledging the increased risk of embolic stroke with chemical cardioversion. If we cannot achieve adequate rate control or heart failure compensation, this is a risk that may be worth taking. Need to evaluate heart rate with activity. Note that he appears to be scheduled for an outpatient Holter monitor already.   Signed, Sanda Klein, MD  08/20/2016 11:25 AM

## 2016-08-20 NOTE — ED Notes (Signed)
Cardiology at bedside.

## 2016-08-21 ENCOUNTER — Inpatient Hospital Stay (HOSPITAL_COMMUNITY): Payer: Medicare Other

## 2016-08-21 DIAGNOSIS — I9589 Other hypotension: Secondary | ICD-10-CM

## 2016-08-21 DIAGNOSIS — R9431 Abnormal electrocardiogram [ECG] [EKG]: Secondary | ICD-10-CM

## 2016-08-21 LAB — BPAM RBC
BLOOD PRODUCT EXPIRATION DATE: 201807312359
ISSUE DATE / TIME: 201807140921
Unit Type and Rh: 9500

## 2016-08-21 LAB — CBC
HCT: 26.3 % — ABNORMAL LOW (ref 39.0–52.0)
Hemoglobin: 8 g/dL — ABNORMAL LOW (ref 13.0–17.0)
MCH: 26.1 pg (ref 26.0–34.0)
MCHC: 30.4 g/dL (ref 30.0–36.0)
MCV: 85.7 fL (ref 78.0–100.0)
PLATELETS: 78 10*3/uL — AB (ref 150–400)
RBC: 3.07 MIL/uL — ABNORMAL LOW (ref 4.22–5.81)
RDW: 21.7 % — AB (ref 11.5–15.5)
WBC: 1.8 10*3/uL — ABNORMAL LOW (ref 4.0–10.5)

## 2016-08-21 LAB — TYPE AND SCREEN
ABO/RH(D): A NEG
Antibody Screen: NEGATIVE
UNIT DIVISION: 0

## 2016-08-21 LAB — BASIC METABOLIC PANEL
Anion gap: 5 (ref 5–15)
BUN: 11 mg/dL (ref 6–20)
CHLORIDE: 102 mmol/L (ref 101–111)
CO2: 25 mmol/L (ref 22–32)
CREATININE: 0.59 mg/dL — AB (ref 0.61–1.24)
Calcium: 9.2 mg/dL (ref 8.9–10.3)
GFR calc Af Amer: >60 mL/min (ref >60)
GFR calc non Af Amer: >60 mL/min (ref >60)
GLUCOSE: 106 mg/dL — AB (ref 65–99)
POTASSIUM: 3.5 mmol/L (ref 3.5–5.1)
Sodium: 132 mmol/L — ABNORMAL LOW (ref 135–145)

## 2016-08-21 LAB — GLUCOSE, CAPILLARY
Glucose-Capillary: 101 mg/dL — ABNORMAL HIGH (ref 65–99)
Glucose-Capillary: 118 mg/dL — ABNORMAL HIGH (ref 65–99)
Glucose-Capillary: 111 mg/dL — ABNORMAL HIGH (ref 65–99)
Glucose-Capillary: 133 mg/dL — ABNORMAL HIGH (ref 65–99)

## 2016-08-21 LAB — ECHOCARDIOGRAM COMPLETE: Weight: 2830.4 oz

## 2016-08-21 LAB — CORTISOL: Cortisol, Plasma: 10.7 ug/dL

## 2016-08-21 MED ORDER — DIGOXIN 125 MCG PO TABS
0.1250 mg | ORAL_TABLET | Freq: Every day | ORAL | Status: DC
  Administered 2016-08-21 – 2016-08-22 (×2): 0.125 mg via ORAL
  Filled 2016-08-21 (×2): qty 1

## 2016-08-21 MED ORDER — POTASSIUM CHLORIDE CRYS ER 10 MEQ PO TBCR
EXTENDED_RELEASE_TABLET | ORAL | Status: AC
  Filled 2016-08-21: qty 1

## 2016-08-21 MED ORDER — POTASSIUM CHLORIDE CRYS ER 20 MEQ PO TBCR
40.0000 meq | EXTENDED_RELEASE_TABLET | Freq: Once | ORAL | Status: AC
Start: 2016-08-21 — End: 2016-08-21
  Administered 2016-08-21: 40 meq via ORAL
  Filled 2016-08-21: qty 2

## 2016-08-21 MED ORDER — OMEGA-3-ACID ETHYL ESTERS 1 G PO CAPS
1.0000 g | ORAL_CAPSULE | Freq: Every day | ORAL | Status: DC
  Administered 2016-08-21 – 2016-08-22 (×2): 1 g via ORAL
  Filled 2016-08-21 (×2): qty 1

## 2016-08-21 NOTE — Progress Notes (Addendum)
Patient ambulated in hallway on room air, approx. 400 ft, SpO2 stayed above 94%.

## 2016-08-21 NOTE — Progress Notes (Signed)
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  He recently saw his cardiologist Dr. Stanford Breed a week ago and he is Lasix was increased at that time, however feels like he continues to have progressive edema and shortness of breath and came to the hospital.  Assessment & Plan: Principal Problem:   Dyspnea Active Problems:   Pancytopenia (HCC)   Type II diabetes mellitus with complication (HCC)   Hypoxia   CHF (congestive heart failure) (HCC)   Acute hypoxic respiratory failure due to acute on chronic diastolic CHF -Initially required BiPAP, on nasal cannula this morning -Received IV Lasix, however hypotensive currently and will hold Lasix -Cardiology consulted, appreciate input -will update a 2D echo, pending this morning -Able to wean off oxygen to room air today, ambulating in the hallway without significant dyspnea -Agree with cardiology given hypotension and intermittent steroid use to rule out adrenal insufficiency  Aortic valve replacement -Repeat a 2D echo, appreciate cardiology evaluation  Pancytopenia -In the setting of multiple myeloma, he is currently in a study at [facility], discussed with pharmacy, will hold his multiple myeloma medication  Multiple myeloma -Followed by oncology at [facility]  Chronic anemia due to chronic GI blood loss -due to AVMs, recently placed on octreotide by his GI doctor -Transfuse 1 unit of irradiated packed red blood cells  A flutter -Rate controlled, not a candidate for anticoagulation  due to GI bleed  Hyponatremia -In the setting of fluid overload, monitor with diuresis    DVT prophylaxis: SCDs Code Status: Full code Family Communication: Discussed with wife at bedside Disposition Plan: Admit to stepdown  Consultants:   Cardiology  Procedures:   2D echo: Pending  Antimicrobials:  None    Subjective: -Feels better, less dyspnea  Objective: Vitals:   08/21/16 0451 08/21/16 0756 08/21/16 1000 08/21/16 1045  BP: 97/62 98/69    Pulse: 85 89    Resp: _0 Temp: 98.3 F (36.8 C) 98.4 F (36.9 C)    TempSrc: Oral Oral    SpO2: 99% 98% 95% 98%  Weight:        Intake/Output Summary (Last 24 hours) at 08/21/16 1351 Last data filed at 08/21/16 1000  Gross per 24 hour  Intake              950 ml  Output              800 ml  Net              150 ml   Filed Weights   08/21/16 0315  Weight: 80.2 kg (176 lb 14.4 oz)    Examination:  Vitals:   08/21/16 0451 08/21/16 0756 08/21/16 1000 08/21/16 1045  BP: 97/62 98/69    Pulse: 85 89    Resp: _1 Temp: 98.3 F (36.8 C) 98.4 F (36.9 C)    TempSrc: Oral Oral    SpO2: 99% 98% 95% 98%  Weight:       Constitutional: NAD Eyes: lids and conjunctivae pale appearing Respiratory: clear to  auscultation bilaterally, no wheezing, no crackles. Normal respiratory effort.  Cardiovascular: Regular rate and rhythm, 3/6 SEM, no rubs / gallops. Pitting LE edema. 2+ pedal pulses.  Abdomen: no tenderness. Bowel sounds positive.  Skin: no rashes, lesions, ulcers. No induration Neurologic: non focal  Data Reviewed: I have independently reviewed following labs and imaging studies   CBC:  Recent Labs Lab 08/19/16 2036 08/20/16 0415 08/20/16 1511 08/21/16 0244  WBC 3.2* 1.8* 2.2* 1.8*  NEUTROABS  --   --  1.5*  --   HGB 8.0* 7.3* 8.2* 8.0*  HCT 27.0* 24.1* 26.0* 26.3*  MCV 88.5 87.6 85.2 85.7  PLT 132* 106* 83* 78*   Basic Metabolic Panel:  Recent Labs Lab 08/19/16 2036  08/20/16 0415 08/21/16 0244  NA 132* 132* 132*  K 3.6 3.4* 3.5  CL 99* 100* 102  CO2 _0 GLUCOSE 111* 111* 106*  BUN _1 CREATININE 0.59* 0.54* 0.59*  CALCIUM 9.0 8.7* 9.2   GFR: Estimated Creatinine Clearance: 85.9 mL/min (A) (by C-G formula based on SCr of 0.59 mg/dL (L)). Liver Function Tests:  Recent Labs Lab 08/19/16 2036 08/20/16 0415  AST 39 36  ALT 8* 13*  ALKPHOS 156* 143*  BILITOT 2.3* 2.8*  PROT 5.9* 5.5*  ALBUMIN 3.3* 3.1*   No results for input(s): LIPASE, AMYLASE in the last 168 hours. No results for input(s): AMMONIA in the last 168 hours. Coagulation Profile:  Recent Labs Lab 08/19/16 2036  INR 1.17   Cardiac Enzymes:  Recent Labs Lab 08/20/16 0415 08/20/16 1511 08/20/16 2115  TROPONINI <0.03 0.04* 0.04*   BNP (last 3 results) No results for input(s): PROBNP in the last 8760 hours. HbA1C: No results for input(s): HGBA1C in the last 72 hours. CBG:  Recent Labs Lab 08/20/16 1224 08/20/16 1633 08/20/16 2107 08/21/16 0721 08/21/16 1123  GLUCAP 131* 100* 109* 101* 118*   Lipid Profile: No results for input(s): CHOL, HDL, LDLCALC, TRIG, CHOLHDL, LDLDIRECT in the last 72 hours. Thyroid Function Tests: No results for input(s): TSH, T4TOTAL, FREET4, T3FREE, THYROIDAB in the last 72 hours. Anemia Panel: No results for input(s): VITAMINB12, FOLATE, FERRITIN, TIBC, IRON, RETICCTPCT in the last 72 hours. Urine analysis:    Component Value Date/Time   COLORURINE YELLOW 03/23/2016 0436   APPEARANCEUR CLEAR 03/23/2016 0436   LABSPEC 1.024 03/23/2016 0436   PHURINE 5.0 03/23/2016 0436   GLUCOSEU NEGATIVE 03/23/2016 0436   GLUCOSEU NEGATIVE 04/25/2012 1108   HGBUR SMALL (A) 03/23/2016 0436   BILIRUBINUR NEGATIVE 03/23/2016 0436   KETONESUR NEGATIVE 03/23/2016 0436   PROTEINUR 30 (A) 03/23/2016 0436   UROBILINOGEN 0.2 05/17/2012 1021   NITRITE NEGATIVE 03/23/2016 0436   LEUKOCYTESUR NEGATIVE 03/23/2016 0436   Sepsis  Labs: Invalid input(s): PROCALCITONIN, LACTICIDVEN  Recent Results (from the past 240 hour(s))  Blood culture (routine x 2)     Status: None (Preliminary result)   Collection Time: 08/19/16  8:40 PM  Result Value Ref Range Status   Specimen Description BLOOD RIGHT ARM  Final   Special Requests   Final    BOTTLES DRAWN AEROBIC AND ANAEROBIC Blood Culture adequate volume   Culture NO GROWTH < 12 HOURS  Final   Report Status PENDING  Incomplete  Blood culture (routine x 2)     Status: None (Preliminary result)   Collection Time: 08/19/16  8:54 PM  Result Value Ref Range Status   Specimen Description BLOOD LEFT ANTECUBITAL  Final   Special Requests  Final    BOTTLES DRAWN AEROBIC AND ANAEROBIC Blood Culture adequate volume   Culture NO GROWTH < 12 HOURS  Final   Report Status PENDING  Incomplete  MRSA PCR Screening     Status: None   Collection Time: 08/20/16 12:58 PM  Result Value Ref Range Status   MRSA by PCR NEGATIVE NEGATIVE Final    Comment:        The GeneXpert MRSA Assay (FDA approved for NASAL specimens only), is one component of a comprehensive MRSA colonization surveillance program. It is not intended to diagnose MRSA infection nor to guide or monitor treatment for MRSA infections.       Radiology Studies: Ct Angio Chest Pe W And/or Wo Contrast  Result Date: 08/19/2016 CLINICAL DATA:  Shortness of breath for 2 hours.  Elevated D-dimer. EXAM: CT ANGIOGRAPHY CHEST WITH CONTRAST TECHNIQUE: Multidetector CT imaging of the chest was performed using the standard protocol during bolus administration of intravenous contrast. Multiplanar CT image reconstructions and MIPs were obtained to evaluate the vascular anatomy. CONTRAST:  80 cc Isovue 370 IV COMPARISON:  Radiograph earlier this day.  Chest CT 08/14/2010 FINDINGS: Cardiovascular: There are no filling defects within the pulmonary arteries to suggest pulmonary embolus. There is multi chamber cardiomegaly. Prosthetic aortic  valve. Thoracic aorta is normal in caliber mild aortic atherosclerosis. No evidence of dissection. There is contrast refluxing into the hepatic veins and IVC. No pericardial effusion. There are coronary artery calcifications. Mediastinum/Nodes: Small prevascular nodes measuring up to 8 mm. No hilar adenopathy. The esophagus is decompressed. Visualized thyroid gland is normal. Lungs/Pleura: Elevated right hemidiaphragm with adjacent compressive atelectasis. There is smooth septal thickening consistent with pulmonary edema. Minimal patchy ground-glass opacity in the perihilar upper lobes also likely pulmonary edema. Mild hypoventilatory atelectasis at the lung bases. Small right pleural effusion versus perihepatic ascites, diaphragm not well-defined. No left pleural fluid. Upper Abdomen: Spleen is enlarged measuring 15.8 cm. Musculoskeletal: There are no acute or suspicious osseous abnormalities. There is a prominent Schmorl's node involving superior endplate of H70 of uncertain acuity. The bones appear under mineralized. Post median sternotomy. Review of the MIP images confirms the above findings. IMPRESSION: 1. No pulmonary embolus. 2. Post aortic valve replacement. Cardiomegaly with contrast refluxing into the hepatic veins and IVC consistent with the liver right heart pressures. Mild pulmonary edema. 3. Mild aortic atherosclerosis and coronary artery calcifications. 4. Incidental splenomegaly in the upper abdomen. Aortic Atherosclerosis (ICD10-I70.0). Electronically Signed   By: Jeb Levering M.D.   On: 08/19/2016 23:42   Dg Chest Portable 1 View  Result Date: 08/19/2016 CLINICAL DATA:  Multiple myeloma, acute onset of shortness of breath over last 2 hours, history hypertension, GERD EXAM: PORTABLE CHEST 1 VIEW COMPARISON:  Portable exam 2039 hours compared to 03/23/2016 FINDINGS: Upper normal size of cardiac silhouette post AVR. Mediastinal contours and pulmonary vascularity normal. Chronic RIGHT basilar  atelectasis. Chronic accentuation of interstitial markings little changed. No segmental consolidation, pleural effusion or pneumothorax. Bones demineralized. IMPRESSION: Chronic RIGHT basilar atelectasis. Post AVR. Electronically Signed   By: Lavonia Dana M.D.   On: 08/19/2016 20:58    Scheduled Meds: . potassium chloride      . carbidopa-levodopa  3 tablet Oral QID  . digoxin  0.125 mg Oral Daily  . finasteride  5 mg Oral Daily  . folic acid  1 mg Oral Daily  . furosemide  20 mg Intravenous Daily  . gabapentin  400 mg Oral BID  . gabapentin  800 mg  Oral Q lunch  . insulin aspart  0-5 Units Subcutaneous QHS  . insulin aspart  0-9 Units Subcutaneous TID WC  . loratadine  10 mg Oral Daily  . metFORMIN  500 mg Oral BID WC  . omega-3 acid ethyl esters  1 g Oral Daily  . pantoprazole  40 mg Oral Daily  . polycarbophil  1,250 mg Oral Daily  . senna-docusate  1 tablet Oral Daily  . simvastatin  20 mg Oral Daily  . sodium chloride flush  3 mL Intravenous Q12H   Continuous Infusions:   Marzetta Board, MD, PhD [facility] Pager [phone]  If 7PM-7AM, please contact night-coverage [email or web address] Password [email or web address] 08/21/2016, 1:51 PM

## 2016-08-21 NOTE — Progress Notes (Signed)
Progress Note  Patient Name: [name] Date of Encounter: 08/21/2016  Primary Cardiologist: Stanford Breed  Subjective   Feeling much better, alert, no dyspnea at rest, off oxygen. Roughly 1 L net diuresis overnight, despite blood transfusion. However, diuretics held this morning due to hypotension. Renal function remains normal. Hemoglobin up to 8. AFlutter ventricular rate control remains mediocre with rates bouncing between 95 and 118 at rest.  Inpatient Medications    Scheduled Meds: . carbidopa-levodopa  3 tablet Oral QID  . digoxin  0.125 mg Oral Daily  . finasteride  5 mg Oral Daily  . folic acid  1 mg Oral Daily  . furosemide  20 mg Intravenous Daily  . gabapentin  400 mg Oral BID  . gabapentin  800 mg Oral Q lunch  . insulin aspart  0-5 Units Subcutaneous QHS  . insulin aspart  0-9 Units Subcutaneous TID WC  . loratadine  10 mg Oral Daily  . metFORMIN  500 mg Oral BID WC  . omega-3 acid ethyl esters  1 g Oral Daily  . pantoprazole  40 mg Oral Daily  . polycarbophil  1,250 mg Oral Daily  . senna-docusate  1 tablet Oral Daily  . simvastatin  20 mg Oral Daily  . sodium chloride flush  3 mL Intravenous Q12H   Continuous Infusions:  PRN Meds: acetaminophen **OR** acetaminophen, cyclobenzaprine, docusate sodium, hydrocortisone, polyvinyl alcohol, simethicone, traMADol, zinc oxide   Vital Signs    Vitals:   08/21/16 0315 08/21/16 0451 08/21/16 0756 08/21/16 1000  BP: (!) 91/58 97/62 98/69   Pulse:  85 89   Resp: _0 Temp: 98.3 F (36.8 C) 98.3 F (36.8 C) 98.4 F (36.9 C)   TempSrc: Oral Oral Oral   SpO2: 99% 99% 98% 95%  Weight: 176 lb 14.4 oz (80.2 kg)       Intake/Output Summary (Last 24 hours) at 08/21/16 1034 Last data filed at 08/21/16 1000  Gross per 24 hour  Intake             1328 ml  Output              800 ml  Net              528 ml   Filed Weights   08/21/16 0315  Weight: 176 lb 14.4 oz (80.2 kg)    Telemetry    Atrial  flutter with variable AV block, 95-118 bpm - Personally Reviewed  ECG    Atrial flutter with variable AV block - Personally Reviewed  Physical Exam  Pale, frail-appearing GEN: No acute distress.   Neck: No JVD Cardiac: Irregular, 3/6 early to mid peaking systolic ejection murmur, no diastolic murmurs, rubs, or gallops. 1+ ankle edema symmetrically Respiratory: Clear to auscultation bilaterally. GI: Soft, nontender, non-distended  MS: No edema; No deformity. Neuro:  Nonfocal  Psych: Normal affect   Labs    Chemistry Recent Labs Lab 08/19/16 2036 08/20/16 0415 08/21/16 0244  NA 132* 132* 132*  K 3.6 3.4* 3.5  CL 99* 100* 102  CO2 _1 GLUCOSE 111* 111* 106*  BUN _2 CREATININE 0.59* 0.54* 0.59*  CALCIUM 9.0 8.7* 9.2  PROT 5.9* 5.5*  --   ALBUMIN 3.3* 3.1*  --   AST 39 36  --   ALT 8* 13*  --   ALKPHOS 156* 143*  --   BILITOT 2.3* 2.8*  --   GFRNONAA >60 >60 >  60  GFRAA >60 >60 >60  ANIONGAP _0 Hematology Recent Labs Lab 08/20/16 0415 08/20/16 1511 08/21/16 0244  WBC 1.8* 2.2* 1.8*  RBC 2.75* 3.05* 3.07*  HGB 7.3* 8.2* 8.0*  HCT 24.1* 26.0* 26.3*  MCV 87.6 85.2 85.7  MCH 26.5 26.9 26.1  MCHC 30.3 31.5 30.4  RDW 21.2* 21.7* 21.7*  PLT 106* 83* 78*    Cardiac Enzymes Recent Labs Lab 08/20/16 0415 08/20/16 1511 08/20/16 2115  TROPONINI <0.03 0.04* 0.04*    Recent Labs Lab 08/19/16 2044  TROPIPOC 0.02     BNP Recent Labs Lab 08/19/16 2036  BNP 368.0*     DDimer  Recent Labs Lab 08/19/16 2036  DDIMER 0.82*     Radiology    Ct Angio Chest Pe W And/or Wo Contrast  Result Date: 08/19/2016 CLINICAL DATA:  Shortness of breath for 2 hours.  Elevated D-dimer. EXAM: CT ANGIOGRAPHY CHEST WITH CONTRAST TECHNIQUE: Multidetector CT imaging of the chest was performed using the standard protocol during bolus administration of intravenous contrast. Multiplanar CT image reconstructions and MIPs were obtained to evaluate the  vascular anatomy. CONTRAST:  80 cc Isovue 370 IV COMPARISON:  Radiograph earlier this day.  Chest CT 08/14/2010 FINDINGS: Cardiovascular: There are no filling defects within the pulmonary arteries to suggest pulmonary embolus. There is multi chamber cardiomegaly. Prosthetic aortic valve. Thoracic aorta is normal in caliber mild aortic atherosclerosis. No evidence of dissection. There is contrast refluxing into the hepatic veins and IVC. No pericardial effusion. There are coronary artery calcifications. Mediastinum/Nodes: Small prevascular nodes measuring up to 8 mm. No hilar adenopathy. The esophagus is decompressed. Visualized thyroid gland is normal. Lungs/Pleura: Elevated right hemidiaphragm with adjacent compressive atelectasis. There is smooth septal thickening consistent with pulmonary edema. Minimal patchy ground-glass opacity in the perihilar upper lobes also likely pulmonary edema. Mild hypoventilatory atelectasis at the lung bases. Small right pleural effusion versus perihepatic ascites, diaphragm not well-defined. No left pleural fluid. Upper Abdomen: Spleen is enlarged measuring 15.8 cm. Musculoskeletal: There are no acute or suspicious osseous abnormalities. There is a prominent Schmorl's node involving superior endplate of G99 of uncertain acuity. The bones appear under mineralized. Post median sternotomy. Review of the MIP images confirms the above findings. IMPRESSION: 1. No pulmonary embolus. 2. Post aortic valve replacement. Cardiomegaly with contrast refluxing into the hepatic veins and IVC consistent with the liver right heart pressures. Mild pulmonary edema. 3. Mild aortic atherosclerosis and coronary artery calcifications. 4. Incidental splenomegaly in the upper abdomen. Aortic Atherosclerosis (ICD10-I70.0). Electronically Signed   By: Jeb Levering M.D.   On: 08/19/2016 23:42   Dg Chest Portable 1 View  Result Date: 08/19/2016 CLINICAL DATA:  Multiple myeloma, acute onset of shortness  of breath over last 2 hours, history hypertension, GERD EXAM: PORTABLE CHEST 1 VIEW COMPARISON:  Portable exam 2039 hours compared to 03/23/2016 FINDINGS: Upper normal size of cardiac silhouette post AVR. Mediastinal contours and pulmonary vascularity normal. Chronic RIGHT basilar atelectasis. Chronic accentuation of interstitial markings little changed. No segmental consolidation, pleural effusion or pneumothorax. Bones demineralized. IMPRESSION: Chronic RIGHT basilar atelectasis. Post AVR. Electronically Signed   By: Lavonia Dana M.D.   On: 08/19/2016 20:58    Cardiac Studies   Echo 01/12/2016 - Left ventricle: The cavity size was normal. Wall thickness was increased in a pattern of moderate LVH. There was mild focal basal hypertrophy of the septum. Systolic function was vigorous. The estimated ejection fraction was in the range of  65% to 70%. Wall motion was normal; there were no regional wall motion abnormalities. Features are consistent with a pseudonormal left ventricular filling pattern, with concomitant abnormal relaxation and increased filling pressure (grade 2 diastolic dysfunction). Doppler parameters are consistent with high ventricular filling pressure. - Aortic valve: A bioprosthesis was present. There was moderate stenosis. There was mild regurgitation. - Mitral valve: Calcified annulus. - Left atrium: The atrium was severely dilated. - Right ventricle: The cavity size was mildly dilated. - Right atrium: The atrium was mildly dilated. - Pulmonary arteries: Systolic pressure was mildly to moderately increased.  Impressions:  - Normal LV systolic function; grade 2 diastolic dysfunction with elevated LV filling pressure; moderate LVH; bioprosthetic aortic valve with elevated mean gradient (30 mmHg); mild AI; severe LAE; mild RAE and RVE; mild TR with mild to moderate elevation in pulmonary pressure.  TEE 10/27/2015 - Left ventricle: There  was focal basal hypertrophy. Systolic function was normal. The estimated ejection fraction was in the range of 55% to 60%. Wall motion was normal; there were no regional wall motion abnormalities. - Aortic valve: A bioprosthesis was present. There was moderate stenosis. There was moderate regurgitation. - Mitral valve: No evidence of vegetation. There was mild regurgitation. - Left atrium: The atrium was mildly dilated. No evidence of thrombus in the atrial cavity or appendage. - Right ventricle: The cavity size was mildly dilated. - Right atrium: The atrium was mildly dilated. No evidence of thrombus in the atrial cavity or appendage. - Atrial septum: There was a patent foramen ovale. There was an atrial septal aneurysm. - Tricuspid valve: No evidence of vegetation.  Impressions:  - Normal LV systolic function; bioprosthetic aortic valve with elevated mean gradient (31 mmHg) and moderate AI that appears paravalvular; mild MR; mild LAE, RAE and RVE; mild TR.  Patient Profile     71 y.o. 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 He presents with acute on chronic diastolic heart failure despite increase in oral diuretics at home. Improved after blood transfusion and diuretics, but treatment limited by hypotension  Assessment & Plan    1. Acute on chronic diastolic heart failure: Related to anemia and aortic stenosis, as well as poorly controlled ventricular rate. Repeat echo pending today. He has had remarkably rapid deterioration in the function of his prosthetic valve. 2. Anemia: Multifactorial, related to chronic GI blood loss, possible bone marrow  suppression from multiple myeloma, possible hemolysis related to paravalvular leak. The dominant problem appears to be iron deficiency secondary to chronic GI blood loss. Agree with transfusion, will need additional diuretic following the transfusion. 3. AS: This is only moderate by both transthoracic and TEE evaluation. Remember need for endocarditis prophylaxis with invasive procedures. 4. A Flutter: Rate control is not great in medications are limited by hypotension. Despite increased risk of embolic events, anticoagulation is not possible with chronic GI blood loss. Will add low-dose digoxin. Consider amiodarone therapy to provide rate control and possible return to sinus rhythm, acknowledging the increased risk of embolic stroke with chemical cardioversion. If we cannot achieve adequate rate control or heart failure compensation, this is a risk that may be worth taking. Need to evaluate heart rate with activity. Note that he appears to be scheduled for an outpatient Holter monitor already. 5. Hypotension:  This appears to be out of proportion with his valvular problems and limits the use of diuretics. Wonder about possible chronic adrenal insufficiency due to long-term treatment with dexamethasone for multiple myeloma. Plan random cortisol level today. Thomas cortisol level is high, consider stimulation test tomorrow.  Signed, Sanda Klein, MD  08/21/2016, 10:34 AM

## 2016-08-21 NOTE — Progress Notes (Signed)
  Echocardiogram 2D Echocardiogram has been performed.  [name] 08/21/2016, 2:03 PM

## 2016-08-22 DIAGNOSIS — C9000 Multiple myeloma not having achieved remission: Secondary | ICD-10-CM

## 2016-08-22 LAB — BASIC METABOLIC PANEL
Anion gap: 6 (ref 5–15)
BUN: 11 mg/dL (ref 6–20)
CHLORIDE: 103 mmol/L (ref 101–111)
CO2: 22 mmol/L (ref 22–32)
CREATININE: 0.5 mg/dL — AB (ref 0.61–1.24)
Calcium: 9.6 mg/dL (ref 8.9–10.3)
GFR calc Af Amer: >60 mL/min (ref >60)
GFR calc non Af Amer: >60 mL/min (ref >60)
Glucose, Bld: 101 mg/dL — ABNORMAL HIGH (ref 65–99)
Potassium: 4 mmol/L (ref 3.5–5.1)
Sodium: 131 mmol/L — ABNORMAL LOW (ref 135–145)

## 2016-08-22 LAB — CBC
HEMATOCRIT: 31.9 % — AB (ref 39.0–52.0)
HEMOGLOBIN: 9.8 g/dL — AB (ref 13.0–17.0)
MCH: 26.8 pg (ref 26.0–34.0)
MCHC: 30.7 g/dL (ref 30.0–36.0)
MCV: 87.2 fL (ref 78.0–100.0)
Platelets: 71 10*3/uL — ABNORMAL LOW (ref 150–400)
RBC: 3.66 MIL/uL — ABNORMAL LOW (ref 4.22–5.81)
RDW: 21.8 % — AB (ref 11.5–15.5)
WBC: 2.2 10*3/uL — AB (ref 4.0–10.5)

## 2016-08-22 LAB — GLUCOSE, CAPILLARY
GLUCOSE-CAPILLARY: 89 mg/dL (ref 65–99)
GLUCOSE-CAPILLARY: 122 mg/dL — AB (ref 65–99)

## 2016-08-22 MED ORDER — DIGOXIN 125 MCG PO TABS: 0.1250 mg | ORAL_TABLET | Freq: Every day | ORAL | 1 refills | Status: DC

## 2016-08-22 NOTE — Progress Notes (Signed)
Progress Note  Patient Name: [name] Date of Encounter: 08/22/2016  Primary Cardiologist: Stanford Breed  Subjective   Feels much better. Net diuresis 1.5 L last 24h, 2.3 L since admission. BP much better - no hypotension. Improved rate control, 80-90 at rest. Hgb 9.8 (seems to good to be true), unchanged thrombocytopenia and leukopenia.  Inpatient Medications    Scheduled Meds: . carbidopa-levodopa  3 tablet Oral QID  . digoxin  0.125 mg Oral Daily  . finasteride  5 mg Oral Daily  . folic acid  1 mg Oral Daily  . furosemide  20 mg Intravenous Daily  . gabapentin  400 mg Oral BID  . gabapentin  800 mg Oral Q lunch  . insulin aspart  0-5 Units Subcutaneous QHS  . insulin aspart  0-9 Units Subcutaneous TID WC  . loratadine  10 mg Oral Daily  . metFORMIN  500 mg Oral BID WC  . omega-3 acid ethyl esters  1 g Oral Daily  . pantoprazole  40 mg Oral Daily  . polycarbophil  1,250 mg Oral Daily  . senna-docusate  1 tablet Oral Daily  . simvastatin  20 mg Oral Daily  . sodium chloride flush  3 mL Intravenous Q12H   Continuous Infusions:  PRN Meds: acetaminophen **OR** acetaminophen, cyclobenzaprine, docusate sodium, hydrocortisone, polyvinyl alcohol, simethicone, traMADol, zinc oxide   Vital Signs    Vitals:   08/21/16 2133 08/22/16 0113 08/22/16 0410 08/22/16 0700  BP: 103/68 99/61 100/63 112/65  Pulse: 96 76 93 78  Resp: 16 16 16 20   Temp: 98.2 F (36.8 C) 98.1 F (36.7 C) 97.8 F (36.6 C) (!) 97.5 F (36.4 C)  TempSrc: Oral Oral Oral Oral  SpO2: 95% 97% 96% 100%  Weight:   176 lb 11.2 oz (80.2 kg)     Intake/Output Summary (Last 24 hours) at 08/22/16 1317 Last data filed at 08/22/16 0900  Gross per 24 hour  Intake              720 ml  Output             1250 ml  Net             -530 ml   Filed Weights   08/21/16 0315 08/22/16 0410  Weight: 176 lb 14.4 oz (80.2 kg) 176 lb 11.2 oz (80.2 kg)    Telemetry    AFlutter with variable AV block -  Personally Reviewed  Physical Exam  Comfortable, relaxed. Still pale GEN: No acute distress.   Neck: No JVD Cardiac: irregular, 3/6 ejection murmur, no murmurs, rubs, or gallops. 1-2+ pedal edema. Respiratory: Clear to auscultation bilaterally. GI: Soft, nontender, non-distended  MS: No edema; No deformity. Neuro:  Nonfocal  Psych: Normal affect   Labs    Chemistry Recent Labs Lab 08/19/16 2036 08/20/16 0415 08/21/16 0244 08/22/16 0457  NA 132* 132* 132* 131*  K 3.6 3.4* 3.5 4.0  CL 99* 100* 102 103  CO2 22 24 25 22   GLUCOSE 111* 111* 106* 101*  BUN 13 11 11 11   CREATININE 0.59* 0.54* 0.59* 0.50*  CALCIUM 9.0 8.7* 9.2 9.6  PROT 5.9* 5.5*  --   --   ALBUMIN 3.3* 3.1*  --   --   AST 39 36  --   --   ALT 8* 13*  --   --   ALKPHOS 156* 143*  --   --   BILITOT 2.3* 2.8*  --   --  GFRNONAA >60 >60 >60 >60  GFRAA >60 >60 >60 >60  ANIONGAP 11 8 5 6      Hematology Recent Labs Lab 08/20/16 1511 08/21/16 0244 08/22/16 0852  WBC 2.2* 1.8* 2.2*  RBC 3.05* 3.07* 3.66*  HGB 8.2* 8.0* 9.8*  HCT 26.0* 26.3* 31.9*  MCV 85.2 85.7 87.2  MCH 26.9 26.1 26.8  MCHC 31.5 30.4 30.7  RDW 21.7* 21.7* 21.8*  PLT 83* 78* 71*    Cardiac Enzymes Recent Labs Lab 08/20/16 0415 08/20/16 1511 08/20/16 2115  TROPONINI <0.03 0.04* 0.04*    Recent Labs Lab 08/19/16 2044  TROPIPOC 0.02     BNP Recent Labs Lab 08/19/16 2036  BNP 368.0*     DDimer  Recent Labs Lab 08/19/16 2036  DDIMER 0.82*     Radiology    No results found.  Cardiac Studies   ECHO 08/21/2016  - Left ventricle: The cavity size was normal. There was mild   concentric hypertrophy. Systolic function was vigorous. The   estimated ejection fraction was in the range of 65% to 70%. - Ventricular septum: Septal motion showed paradox. - Aortic valve: A bioprosthesis was present. Moderate leaflet   thickening and restricted motion is seen. There was mild   regurgitation directed centrally in the LVOT.  There was mild   perivalvular regurgitation. Valve area (VTI): 1.31 cm^2. Valve   area (Vmax): 1.31 cm^2. Valve area (Vmean): 1.47 cm^2. - Mitral valve: Calcified annulus. - Left atrium: The atrium was severely dilated. - Right atrium: The atrium was moderately dilated. - Pulmonary arteries: Systolic pressure was mildly increased. PA   peak pressure: 41 mm Hg (S).  Impressions:  - Aortic valve gradients may be underestimated due to poor beam   alignment, but there appears to be only moderate prosthetic valve   stenosis.  Patient Profile     71 y.o. 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 He presents with acute on chronic diastolic heart failure despite increase in oral diuretics at home. Improved after blood transfusion, diuretics, added digoxin for better rate control.  Assessment & Plan    1. Acute on chronicdiastolic heart failure: Related to anemia and aortic stenosis, as well as poorly controlled ventricular rate. Repeat echo shows unchanged findings. AS does really not look that bad and AI is not significant in my opinion. Still a little hypervolemic, but prefer gradual diuresis with the recently increased dose of oral furosemide. 2. Anemia: Multifactorial, related to primarily to chronic GI blood loss, also bone marrow suppression from multiple myeloma, possible hemolysis related to paravalvular leak. The dominant problem appears to be iron deficiency secondary to chronic GI blood loss. 3. AS: This is only moderate by both transthoracic and TEE evaluation. Remember need for endocarditis prophylaxis with invasive procedures. 4. AFlutter: Rate control is much improved,  whether from digoxin or from better compensation of HF and anemia. Despite increased risk of embolic events, anticoagulation is not possible with chronic GI blood loss. Note that he appears to be scheduled for an outpatient Holter monitor already. 5. Hypotension: This appears to be out of proportion with his valvular problems and limits the use of diuretics. Wonder about possible chronic adrenal insufficiency due to long-term treatment with dexamethasone for multiple myeloma, but random cortisol level seems high  enough to exclude that.  Signed, Sanda Klein, MD  08/22/2016, 1:17 PM

## 2016-08-22 NOTE — Consult Note (Addendum)
   [facility] CM Inpatient Consult   08/22/2016  LOVE [place]BOURNE 02-Sep-1945 [id number]   Patient screened for potential [facility] Care Management services. Met with Mr. Ceci and wife prior to discharge. Confirmed Primary Care MD is with the [facility]. States he follows closely with his Primary Care Provider.  Due [facility], discussed that he is not eligible for [facility] Care Management at this time.  Denies having any needs however. Appreciative of visit. Made inpatient RNCM aware.   Marthenia Rolling, MSN-Ed, RN,BSN [facility] Liaison [phone]

## 2016-08-22 NOTE — Consult Note (Addendum)
[place] Nurse wound consult note Reason for Consult: Consult requested for middle back.  Pt states he developed an abrasion to this site while in the CT scanner. Wife assessed the appearance and states it has greatly improved since the skin tear occurred. Wound type: Previous partial thickness abrasion has healed at this time; no further open wound or drainage.  1X1cm red area with intact skin over spinal bone to middle back Pressure Injury POA: NA Dressing procedure/placement/frequency: Foam dressing has previously been applied to protect site and promote healing.  Leave in place to prevent shear; this dressing can be discontinued when pt is discharged.  Discussed plan of care with patient and wife and he verbalized understanding. Please re-consult if further assistance is needed.  Thank-you,  Julien Girt MSN, [place], [place], [place], [place]

## 2016-08-22 NOTE — Discharge Summary (Signed)
Physician Discharge Summary  GOTTLIEB ZUERCHER TGY:[id number] DOB: 1945/09/06 DOA: 08/19/2016  PCP: Buzzy Han, MD  Admit date: 08/19/2016 Discharge date: 08/22/2016  Admitted From: home Disposition:  home  Recommendations for Outpatient Follow-up:  1. Follow up with PCP in 1-2 weeks 2. Follow up with [place] center as scheduled   Home Health: none Equipment/Devices: none   Discharge Condition: stable CODE STATUS: Full code Diet recommendation: heart healthy  HPI: Per Dr. Maudie Mercury, [name]  is a 71 y.o. male, w c/o dyspnea Sunday for about  15 minutes.  And then recurred today. Denies fever, chills, cough, cp, palp, n/v, diarrhea.  Pt had blood transfusion 7/13.  Pt presents due to increase in dyspnea. In ED  Po 89% on RA,  Pt had CTA chest => mild pulmonary edema.   No PE.  Wbc 3.2, Hgb 8.0, Plt 132  Pt will be admitted for mild chf.   Hospital Course: Discharge Diagnoses:  Principal Problem:   Dyspnea Active Problems:   Pancytopenia ([place])   Type II diabetes mellitus with complication (HCC)   Hypoxia   CHF (congestive heart failure) (HCC)   Acute hypoxic respiratory failure due to acute on chronic diastolic CHF -patient was admitted to the hospital with acute hypoxic respiratory failure in setting of acute on chronic diastolic CHF.  He initially required BiPAP.  He was diuresed with IV Lasix, which was somewhat limited due to intermittent hypotension.  He was net -2.5 L.  Cardiology was consulted and have follow patient while hospitalized.  His respiratory status improved, he was able to be weaned off on room air, and on the day of discharge she was able to ambulate in the hallway without difficulties.  His Lasix dose was increased to 60 mg daily. History of aortic valve replacement -patient underwent a 2D echo which showed moderate prosthetic valve stenosis as well as mild perivalvular regurgitation. Pancytopenia -In the setting of multiple myeloma, he is  currently in a study at [facility].  He need to be transfused for a hemoglobin in the 7 range, hemoglobin 9.8 on discharge.  White count and platelets are stable. 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  Hemoglobin stable, responded appropriately to transfusion and no active bleeding was noted A flutter -Rate controlled, not a candidate for anticoagulation due to GI bleed.  Started on digoxin by cardiology, continue on discharge Hyponatremia -In the setting of fluid overload, overall stable   Discharge Instructions   Allergies as of 08/22/2016      Reactions   Avodart [dutasteride] Other (See Comments)   Lose of use of arms and legs   Spironolactone Other (See Comments)   Low tolerance    Codeine Other (See Comments)   GI upset in large doses   Diazepam Other (See Comments)   Mood changes   Doxazosin Other (See Comments)   Dizziness   Feraheme [ferumoxytol] Swelling   Pedal edema      Medication List    TAKE these medications   Acetaminophen 500 MG coapsule Take 500 mg by mouth every 6 (six) hours as needed for fever.   carbidopa-levodopa 25-100 MG tablet Commonly known as:  SINEMET IR Take 3 tablets by mouth 4 (four) times daily.   cetirizine 10 MG tablet Commonly known as:  ZYRTEC Take 10 mg by mouth daily.   cyclobenzaprine 10 MG tablet Commonly  known as:  FLEXERIL Take 5 mg by mouth 3 (three) times daily as needed for muscle spasms.   dexamethasone 4 MG tablet Commonly known as:  DECADRON Take 10 mg by mouth every 7 (seven) days. days 1, 8, 15 and 22 of 28 day cycle.   diazepam 2 MG tablet Commonly known as:  VALIUM Take 2 mg by mouth at bedtime as needed for anxiety.   digoxin 0.125 MG tablet Commonly known as:  LANOXIN Take 1 tablet (0.125 mg total) by mouth daily.   docusate sodium 100 MG capsule Commonly known as:  COLACE Take 100 mg  by mouth 2 (two) times daily as needed for moderate constipation.   FIBERCON 625 MG tablet Generic drug:  polycarbophil Take 2 tablets by mouth daily.   finasteride 5 MG tablet Commonly known as:  PROSCAR Take 5 mg by mouth daily.   fish oil-omega-3 fatty acids 1000 MG capsule Take 1 g by mouth daily.   folic acid 1 MG tablet Commonly known as:  FOLVITE Take 1 mg by mouth daily.   furosemide 40 MG tablet Commonly known as:  LASIX Take 1.5 tablets (60 mg total) by mouth daily.   gabapentin 800 MG tablet Commonly known as:  NEURONTIN Take 800 mg by mouth 4 (four) times daily. Take 1/2 tablet every morning and at bedtime then take 1 tablet at lunch   HYDROcodone-acetaminophen 5-325 MG tablet Commonly known as:  NORCO/VICODIN Take 1 tablet by mouth every 6 (six) hours as needed for moderate pain.   hydrocortisone 2.5 % rectal cream Commonly known as:  ANUSOL-HC Place 1 application rectally 2 (two) times daily.   ketoconazole 2 % cream Commonly known as:  NIZORAL Apply 1 application topically daily as needed for irritation.   metFORMIN 500 MG tablet Commonly known as:  GLUCOPHAGE Take 500 mg by mouth 2 (two) times daily with a meal.   methylcellulose 1 % ophthalmic solution Commonly known as:  ARTIFICIAL TEARS Place 1 drop into both eyes at bedtime as needed (dry eyes).   octreotide 100 MCG/ML Soln injection Commonly known as:  SANDOSTATIN Inject 100 mcg into the skin every 12 (twelve) hours. .25 mL twice daily for 14 days---Starting 08/10/16   pantoprazole 40 MG tablet Commonly known as:  PROTONIX Take 1 tablet (40 mg total) by mouth daily. For GERD   pomalidomide 4 MG capsule Commonly known as:  POMALYST Take 4 mg by mouth See admin instructions. Take with water on days 1-21 then  PATIENT IS OFF MED FOR 7 DAYS, Repeat every 28 days.   senna-docusate 8.6-50 MG tablet Commonly known as:  Senokot-S Take 1 tablet by mouth daily.   simethicone 80 MG chewable  tablet Commonly known as:  MYLICON Chew 80 mg by mouth every 6 (six) hours as needed for flatulence.   simvastatin 40 MG tablet Commonly known as:  ZOCOR Take 20 mg by mouth daily.   traMADol 50 MG tablet Commonly known as:  ULTRAM Take 50 mg by mouth every 6 (six) hours as needed for moderate pain.   zinc oxide 20 % ointment Apply 1 application topically 2 (two) times daily as needed for irritation.      Follow-up Information    Buzzy Han, MD. Schedule an appointment as soon as possible for a visit in 2 week(s).   Specialty:  Family Medicine Contact information: [address] [place]  [place] [address] [phone]          Allergies  Allergen Reactions  .  Avodart [Dutasteride] Other (See Comments)    Lose of use of arms and legs   . Spironolactone Other (See Comments)    Low tolerance   . Codeine Other (See Comments)    GI upset in large doses  . Diazepam Other (See Comments)    Mood changes   . Doxazosin Other (See Comments)    Dizziness   . Feraheme [Ferumoxytol] Swelling    Pedal edema    Consultations:  Cardiology  Procedures/Studies:  2D echo  Impressions: - Aortic valve gradients may be underestimated due to poor beam alignment, but there appears to be only moderate prosthetic valve stenosis.  Ct Angio Chest Pe W And/or Wo Contrast  Result Date: 08/19/2016 CLINICAL DATA:  Shortness of breath for 2 hours.  Elevated D-dimer. EXAM: CT ANGIOGRAPHY CHEST WITH CONTRAST TECHNIQUE: Multidetector CT imaging of the chest was performed using the standard protocol during bolus administration of intravenous contrast. Multiplanar CT image reconstructions and MIPs were obtained to evaluate the vascular anatomy. CONTRAST:  80 cc Isovue 370 IV COMPARISON:  Radiograph earlier this day.  Chest CT 08/14/2010 FINDINGS: Cardiovascular: There are no filling defects within the pulmonary arteries to suggest pulmonary embolus. There is multi  chamber cardiomegaly. Prosthetic aortic valve. Thoracic aorta is normal in caliber mild aortic atherosclerosis. No evidence of dissection. There is contrast refluxing into the hepatic veins and IVC. No pericardial effusion. There are coronary artery calcifications. Mediastinum/Nodes: Small prevascular nodes measuring up to 8 mm. No hilar adenopathy. The esophagus is decompressed. Visualized thyroid gland is normal. Lungs/Pleura: Elevated right hemidiaphragm with adjacent compressive atelectasis. There is smooth septal thickening consistent with pulmonary edema. Minimal patchy ground-glass opacity in the perihilar upper lobes also likely pulmonary edema. Mild hypoventilatory atelectasis at the lung bases. Small right pleural effusion versus perihepatic ascites, diaphragm not well-defined. No left pleural fluid. Upper Abdomen: Spleen is enlarged measuring 15.8 cm. Musculoskeletal: There are no acute or suspicious osseous abnormalities. There is a prominent Schmorl's node involving superior endplate of A25 of uncertain acuity. The bones appear under mineralized. Post median sternotomy. Review of the MIP images confirms the above findings. IMPRESSION: 1. No pulmonary embolus. 2. Post aortic valve replacement. Cardiomegaly with contrast refluxing into the hepatic veins and IVC consistent with the liver right heart pressures. Mild pulmonary edema. 3. Mild aortic atherosclerosis and coronary artery calcifications. 4. Incidental splenomegaly in the upper abdomen. Aortic Atherosclerosis (ICD10-I70.0). Electronically Signed   By: Jeb Levering M.D.   On: 08/19/2016 23:42   Dg Chest Portable 1 View  Result Date: 08/19/2016 CLINICAL DATA:  Multiple myeloma, acute onset of shortness of breath over last 2 hours, history hypertension, GERD EXAM: PORTABLE CHEST 1 VIEW COMPARISON:  Portable exam 2039 hours compared to 03/23/2016 FINDINGS: Upper normal size of cardiac silhouette post AVR. Mediastinal contours and pulmonary  vascularity normal. Chronic RIGHT basilar atelectasis. Chronic accentuation of interstitial markings little changed. No segmental consolidation, pleural effusion or pneumothorax. Bones demineralized. IMPRESSION: Chronic RIGHT basilar atelectasis. Post AVR. Electronically Signed   By: Lavonia Dana M.D.   On: 08/19/2016 20:58     Subjective: - no chest pain, shortness of breath, no abdominal pain, nausea or vomiting.   Discharge Exam: Vitals:   08/22/16 0410 08/22/16 0700  BP: 100/63 112/65  Pulse: 93 78  Resp: 16 20  Temp: 97.8 F (36.6 C) (!) 97.5 F (36.4 C)   Vitals:   08/21/16 2133 08/22/16 0113 08/22/16 0410 08/22/16 0700  BP: 103/68 99/61 100/63 112/65  Pulse:  96 76 93 78  Resp: _0 Temp: 98.2 F (36.8 C) 98.1 F (36.7 C) 97.8 F (36.6 C) (!) 97.5 F (36.4 C)  TempSrc: Oral Oral Oral Oral  SpO2: 95% 97% 96% 100%  Weight:   80.2 kg (176 lb 11.2 oz)     General: Pt is alert, awake, not in acute distress Cardiovascular: RRR, S1/S2 +, no rubs, no gallops Respiratory: CTA bilaterally, no wheezing, no rhonchi Abdominal: Soft, NT, ND, bowel sounds + Extremities: no edema, no cyanosis    The results of significant diagnostics from this hospitalization (including imaging, microbiology, ancillary and laboratory) are listed below for reference.     Microbiology: Recent Results (from the past 240 hour(s))  Blood culture (routine x 2)     Status: None (Preliminary result)   Collection Time: 08/19/16  8:40 PM  Result Value Ref Range Status   Specimen Description BLOOD RIGHT ARM  Final   Special Requests   Final    BOTTLES DRAWN AEROBIC AND ANAEROBIC Blood Culture adequate volume   Culture NO GROWTH 3 DAYS  Final   Report Status PENDING  Incomplete  Blood culture (routine x 2)     Status: None (Preliminary result)   Collection Time: 08/19/16  8:54 PM  Result Value Ref Range Status   Specimen Description BLOOD LEFT ANTECUBITAL  Final   Special Requests   Final     BOTTLES DRAWN AEROBIC AND ANAEROBIC Blood Culture adequate volume   Culture NO GROWTH 3 DAYS  Final   Report Status PENDING  Incomplete  MRSA PCR Screening     Status: None   Collection Time: 08/20/16 12:58 PM  Result Value Ref Range Status   MRSA by PCR NEGATIVE NEGATIVE Final    Comment:        The GeneXpert MRSA Assay (FDA approved for NASAL specimens only), is one component of a comprehensive MRSA colonization surveillance program. It is not intended to diagnose MRSA infection nor to guide or monitor treatment for MRSA infections.      Labs: BNP (last 3 results)  Recent Labs  08/19/16 2036  BNP 818.5*   Basic Metabolic Panel:  Recent Labs Lab 08/19/16 2036 08/20/16 0415 08/21/16 0244 08/22/16 0457  NA 132* 132* 132* 131*  K 3.6 3.4* 3.5 4.0  CL 99* 100* 102 103  CO2 _1 GLUCOSE 111* 111* 106* 101*  BUN _2 CREATININE 0.59* 0.54* 0.59* 0.50*  CALCIUM 9.0 8.7* 9.2 9.6   Liver Function Tests:  Recent Labs Lab 08/19/16 2036 08/20/16 0415  AST 39 36  ALT 8* 13*  ALKPHOS 156* 143*  BILITOT 2.3* 2.8*  PROT 5.9* 5.5*  ALBUMIN 3.3* 3.1*   No results for input(s): LIPASE, AMYLASE in the last 168 hours. No results for input(s): AMMONIA in the last 168 hours. CBC:  Recent Labs Lab 08/19/16 2036 08/20/16 0415 08/20/16 1511 08/21/16 0244 08/22/16 0852  WBC 3.2* 1.8* 2.2* 1.8* 2.2*  NEUTROABS  --   --  1.5*  --   --   HGB 8.0* 7.3* 8.2* 8.0* 9.8*  HCT 27.0* 24.1* 26.0* 26.3* 31.9*  MCV 88.5 87.6 85.2 85.7 87.2  PLT 132* 106* 83* 78* 71*   Cardiac Enzymes:  Recent Labs Lab 08/20/16 0415 08/20/16 1511 08/20/16 2115  TROPONINI <0.03 0.04* 0.04*   BNP: Invalid input(s): POCBNP CBG:  Recent Labs Lab 08/21/16 1123 08/21/16 1634 08/21/16 2125 08/22/16 0731 08/22/16 1215  GLUCAP  118* 111* 133* 89 122*   D-Dimer  Recent Labs  08/19/16 2036  DDIMER 0.82*   Hgb A1c No results for input(s): HGBA1C in the last 72  hours. Lipid Profile No results for input(s): CHOL, HDL, LDLCALC, TRIG, CHOLHDL, LDLDIRECT in the last 72 hours. Thyroid function studies No results for input(s): TSH, T4TOTAL, T3FREE, THYROIDAB in the last 72 hours.  Invalid input(s): FREET3 Anemia work up No results for input(s): VITAMINB12, FOLATE, FERRITIN, TIBC, IRON, RETICCTPCT in the last 72 hours. Urinalysis    Component Value Date/Time   COLORURINE YELLOW 03/23/2016 0436   APPEARANCEUR CLEAR 03/23/2016 0436   LABSPEC 1.024 03/23/2016 0436   PHURINE 5.0 03/23/2016 0436   GLUCOSEU NEGATIVE 03/23/2016 0436   GLUCOSEU NEGATIVE 04/25/2012 1108   HGBUR SMALL (A) 03/23/2016 0436   BILIRUBINUR NEGATIVE 03/23/2016 0436   KETONESUR NEGATIVE 03/23/2016 0436   PROTEINUR 30 (A) 03/23/2016 0436   UROBILINOGEN 0.2 05/17/2012 1021   NITRITE NEGATIVE 03/23/2016 0436   LEUKOCYTESUR NEGATIVE 03/23/2016 0436   Sepsis Labs Invalid input(s): PROCALCITONIN,  WBC,  LACTICIDVEN Microbiology Recent Results (from the past 240 hour(s))  Blood culture (routine x 2)     Status: None (Preliminary result)   Collection Time: 08/19/16  8:40 PM  Result Value Ref Range Status   Specimen Description BLOOD RIGHT ARM  Final   Special Requests   Final    BOTTLES DRAWN AEROBIC AND ANAEROBIC Blood Culture adequate volume   Culture NO GROWTH 3 DAYS  Final   Report Status PENDING  Incomplete  Blood culture (routine x 2)     Status: None (Preliminary result)   Collection Time: 08/19/16  8:54 PM  Result Value Ref Range Status   Specimen Description BLOOD LEFT ANTECUBITAL  Final   Special Requests   Final    BOTTLES DRAWN AEROBIC AND ANAEROBIC Blood Culture adequate volume   Culture NO GROWTH 3 DAYS  Final   Report Status PENDING  Incomplete  MRSA PCR Screening     Status: None   Collection Time: 08/20/16 12:58 PM  Result Value Ref Range Status   MRSA by PCR NEGATIVE NEGATIVE Final    Comment:        The GeneXpert MRSA Assay (FDA approved for NASAL  specimens only), is one component of a comprehensive MRSA colonization surveillance program. It is not intended to diagnose MRSA infection nor to guide or monitor treatment for MRSA infections.    Time coordinating discharge: 38 minutes  SIGNED:  Marzetta Board, MD  [facility] 08/22/2016, 3:23 PM Pager [phone]  If 7PM-7AM, please contact night-coverage [email or web address] Password [email or web address]

## 2016-08-22 NOTE — Discharge Instructions (Signed)
Follow with Buzzy Han, MD in 2-4 weeks  Please get a complete blood count and chemistry panel checked by your Primary MD at your next visit, and again as instructed by your Primary MD. Please get your medications reviewed and adjusted by your Primary MD.  Please request your Primary MD to go over all Hospital Tests and Procedure/Radiological results at the follow up, please get all Hospital records sent to your Prim MD by signing hospital release before you go home.  If you had Pneumonia of Lung problems at the Hospital: Please get a 2 view Chest X ray done in 6-8 weeks after hospital discharge or sooner if instructed by your Primary MD.  If you have Congestive Heart Failure: Please call your Cardiologist or Primary MD anytime you have any of the following symptoms:  1) 3 pound weight gain in 24 hours or 5 pounds in 1 week  2) shortness of breath, with or without a dry hacking cough  3) swelling in the hands, feet or stomach  4) if you have to sleep on extra pillows at night in order to breathe  Follow cardiac low salt diet and 1.5 lit/day fluid restriction.  If you have diabetes Accuchecks 4 times/day, Once in AM empty stomach and then before each meal. Log in all results and show them to your primary doctor at your next visit. If any glucose reading is under 80 or above 300 call your primary MD immediately.  If you have Seizure/Convulsions/Epilepsy: Please do not drive, operate heavy machinery, participate in activities at heights or participate in high speed sports until you have seen by Primary MD or a Neurologist and advised to do so again.  If you had Gastrointestinal Bleeding: Please ask your Primary MD to check a complete blood count within one week of discharge or at your next visit. Your endoscopic/colonoscopic biopsies that are pending at the time of discharge, will also need to followed by your Primary MD.  Get Medicines reviewed and adjusted. Please take all  your medications with you for your next visit with your Primary MD  Please request your Primary MD to go over all hospital tests and procedure/radiological results at the follow up, please ask your Primary MD to get all Hospital records sent to his/her office.  If you experience worsening of your admission symptoms, develop shortness of breath, life threatening emergency, suicidal or homicidal thoughts you must seek medical attention immediately by calling 911 or calling your MD immediately  if symptoms less severe.  You must read complete instructions/literature along with all the possible adverse reactions/side effects for all the Medicines you take and that have been prescribed to you. Take any new Medicines after you have completely understood and accpet all the possible adverse reactions/side effects.   Do not drive or operate heavy machinery when taking Pain medications.   Do not take more than prescribed Pain, Sleep and Anxiety Medications  Special Instructions: If you have smoked or chewed Tobacco  in the last 2 yrs please stop smoking, stop any regular Alcohol  and or any Recreational drug use.  Wear Seat belts while driving.  Please note You were cared for by a hospitalist during your hospital stay. If you have any questions about your discharge medications or the care you received while you were in the hospital after you are discharged, you can call the unit and asked to speak with the hospitalist on call if the hospitalist that took care of you is not available. Once  you are discharged, your primary care physician will handle any further medical issues. Please note that NO REFILLS for any discharge medications will be authorized once you are discharged, as it is imperative that you return to your primary care physician (or establish a relationship with a primary care physician if you do not have one) for your aftercare needs so that they can reassess your need for medications and monitor  your lab values.  You can reach the hospitalist office at phone [phone] or fax [phone]   If you do not have a primary care physician, you can call [phone] for a physician referral.  Activity: As tolerated with Full fall precautions use walker/cane & assistance as needed  Diet: heart healthy  Disposition Home

## 2016-08-22 NOTE — Progress Notes (Signed)
Patient has received all discharge information and education. Patient has verbalized his understanding.

## 2016-08-24 LAB — CULTURE, BLOOD (ROUTINE X 2)
Culture: NO GROWTH
Culture: NO GROWTH
SPECIAL REQUESTS: ADEQUATE
Special Requests: ADEQUATE

## 2016-08-25 ENCOUNTER — Ambulatory Visit (INDEPENDENT_AMBULATORY_CARE_PROVIDER_SITE_OTHER): Payer: Medicare Other

## 2016-08-25 DIAGNOSIS — I483 Typical atrial flutter: Secondary | ICD-10-CM

## 2016-09-02 DIAGNOSIS — R479 Unspecified speech disturbances: Secondary | ICD-10-CM | POA: Diagnosis not present

## 2016-09-02 DIAGNOSIS — R5383 Other fatigue: Secondary | ICD-10-CM | POA: Diagnosis not present

## 2016-09-02 DIAGNOSIS — R4182 Altered mental status, unspecified: Secondary | ICD-10-CM | POA: Diagnosis not present

## 2016-09-02 DIAGNOSIS — R410 Disorientation, unspecified: Secondary | ICD-10-CM | POA: Diagnosis not present

## 2016-09-03 DIAGNOSIS — E119 Type 2 diabetes mellitus without complications: Secondary | ICD-10-CM | POA: Diagnosis not present

## 2016-09-03 DIAGNOSIS — K219 Gastro-esophageal reflux disease without esophagitis: Secondary | ICD-10-CM | POA: Diagnosis not present

## 2016-09-03 DIAGNOSIS — I517 Cardiomegaly: Secondary | ICD-10-CM | POA: Diagnosis not present

## 2016-09-03 DIAGNOSIS — D62 Acute posthemorrhagic anemia: Secondary | ICD-10-CM | POA: Diagnosis not present

## 2016-09-03 DIAGNOSIS — I4892 Unspecified atrial flutter: Secondary | ICD-10-CM | POA: Diagnosis not present

## 2016-09-03 DIAGNOSIS — G2581 Restless legs syndrome: Secondary | ICD-10-CM | POA: Diagnosis not present

## 2016-09-03 DIAGNOSIS — I440 Atrioventricular block, first degree: Secondary | ICD-10-CM | POA: Diagnosis not present

## 2016-09-03 DIAGNOSIS — N400 Enlarged prostate without lower urinary tract symptoms: Secondary | ICD-10-CM | POA: Diagnosis not present

## 2016-09-03 DIAGNOSIS — F411 Generalized anxiety disorder: Secondary | ICD-10-CM | POA: Diagnosis not present

## 2016-09-03 DIAGNOSIS — R9431 Abnormal electrocardiogram [ECG] [EKG]: Secondary | ICD-10-CM | POA: Diagnosis not present

## 2016-09-03 DIAGNOSIS — R404 Transient alteration of awareness: Secondary | ICD-10-CM | POA: Diagnosis not present

## 2016-09-05 ENCOUNTER — Telehealth: Payer: Self-pay | Admitting: *Deleted

## 2016-09-05 DIAGNOSIS — I440 Atrioventricular block, first degree: Secondary | ICD-10-CM | POA: Diagnosis not present

## 2016-09-05 DIAGNOSIS — I4892 Unspecified atrial flutter: Secondary | ICD-10-CM | POA: Diagnosis not present

## 2016-09-05 DIAGNOSIS — I517 Cardiomegaly: Secondary | ICD-10-CM | POA: Diagnosis not present

## 2016-09-05 DIAGNOSIS — R9431 Abnormal electrocardiogram [ECG] [EKG]: Secondary | ICD-10-CM | POA: Diagnosis not present

## 2016-09-05 NOTE — Telephone Encounter (Signed)
Left a message to call back for holter results:  Notes recorded by Lelon Perla, MD on 09/03/2016 at 9:42 PM EDT A flutter rate controlled, continue present meds

## 2016-09-24 ENCOUNTER — Emergency Department (HOSPITAL_COMMUNITY): Payer: Medicare Other

## 2016-09-24 ENCOUNTER — Encounter (HOSPITAL_COMMUNITY): Payer: Self-pay | Admitting: *Deleted

## 2016-09-24 ENCOUNTER — Observation Stay (HOSPITAL_COMMUNITY)
Admission: EM | Admit: 2016-09-24 | Discharge: 2016-09-25 | Disposition: A | Discharge status: Home / Self Care | Payer: Medicare Other | Attending: Internal Medicine | Admitting: Internal Medicine

## 2016-09-24 DIAGNOSIS — D649 Anemia, unspecified: Secondary | ICD-10-CM | POA: Diagnosis not present

## 2016-09-24 DIAGNOSIS — K922 Gastrointestinal hemorrhage, unspecified: Secondary | ICD-10-CM | POA: Diagnosis not present

## 2016-09-24 DIAGNOSIS — G2581 Restless legs syndrome: Secondary | ICD-10-CM | POA: Diagnosis not present

## 2016-09-24 DIAGNOSIS — E0590 Thyrotoxicosis, unspecified without thyrotoxic crisis or storm: Secondary | ICD-10-CM | POA: Diagnosis not present

## 2016-09-24 DIAGNOSIS — G4733 Obstructive sleep apnea (adult) (pediatric): Secondary | ICD-10-CM | POA: Diagnosis not present

## 2016-09-24 DIAGNOSIS — I110 Hypertensive heart disease with heart failure: Secondary | ICD-10-CM | POA: Diagnosis not present

## 2016-09-24 DIAGNOSIS — Z79899 Other long term (current) drug therapy: Secondary | ICD-10-CM | POA: Diagnosis not present

## 2016-09-24 DIAGNOSIS — F329 Major depressive disorder, single episode, unspecified: Secondary | ICD-10-CM | POA: Diagnosis not present

## 2016-09-24 DIAGNOSIS — E119 Type 2 diabetes mellitus without complications: Secondary | ICD-10-CM | POA: Insufficient documentation

## 2016-09-24 DIAGNOSIS — K219 Gastro-esophageal reflux disease without esophagitis: Secondary | ICD-10-CM | POA: Diagnosis not present

## 2016-09-24 DIAGNOSIS — C9000 Multiple myeloma not having achieved remission: Secondary | ICD-10-CM | POA: Diagnosis not present

## 2016-09-24 DIAGNOSIS — R0602 Shortness of breath: Secondary | ICD-10-CM | POA: Diagnosis present

## 2016-09-24 DIAGNOSIS — Z87891 Personal history of nicotine dependence: Secondary | ICD-10-CM | POA: Insufficient documentation

## 2016-09-24 DIAGNOSIS — M1990 Unspecified osteoarthritis, unspecified site: Secondary | ICD-10-CM | POA: Insufficient documentation

## 2016-09-24 DIAGNOSIS — I4892 Unspecified atrial flutter: Secondary | ICD-10-CM | POA: Diagnosis not present

## 2016-09-24 DIAGNOSIS — Z7984 Long term (current) use of oral hypoglycemic drugs: Secondary | ICD-10-CM | POA: Diagnosis not present

## 2016-09-24 DIAGNOSIS — D500 Iron deficiency anemia secondary to blood loss (chronic): Secondary | ICD-10-CM | POA: Insufficient documentation

## 2016-09-24 DIAGNOSIS — Z953 Presence of xenogenic heart valve: Secondary | ICD-10-CM | POA: Diagnosis not present

## 2016-09-24 DIAGNOSIS — R0682 Tachypnea, not elsewhere classified: Secondary | ICD-10-CM | POA: Diagnosis not present

## 2016-09-24 DIAGNOSIS — E860 Dehydration: Secondary | ICD-10-CM | POA: Diagnosis not present

## 2016-09-24 DIAGNOSIS — R42 Dizziness and giddiness: Secondary | ICD-10-CM | POA: Diagnosis not present

## 2016-09-24 DIAGNOSIS — E785 Hyperlipidemia, unspecified: Secondary | ICD-10-CM | POA: Diagnosis not present

## 2016-09-24 DIAGNOSIS — I350 Nonrheumatic aortic (valve) stenosis: Secondary | ICD-10-CM | POA: Insufficient documentation

## 2016-09-24 DIAGNOSIS — M109 Gout, unspecified: Secondary | ICD-10-CM | POA: Insufficient documentation

## 2016-09-24 DIAGNOSIS — I5032 Chronic diastolic (congestive) heart failure: Secondary | ICD-10-CM | POA: Diagnosis not present

## 2016-09-24 LAB — GLUCOSE, CAPILLARY
Glucose-Capillary: 122 mg/dL — ABNORMAL HIGH (ref 65–99)
Glucose-Capillary: 98 mg/dL (ref 65–99)

## 2016-09-24 LAB — CBC WITH DIFFERENTIAL/PLATELET
BASOS ABS: 0 10*3/uL (ref 0.0–0.1)
Basophils Relative: 1 %
EOS ABS: 0 10*3/uL (ref 0.0–0.7)
Eosinophils Relative: 3 %
HCT: 24.8 % — ABNORMAL LOW (ref 39.0–52.0)
Hemoglobin: 7.6 g/dL — ABNORMAL LOW (ref 13.0–17.0)
LYMPHS ABS: 0.6 10*3/uL — AB (ref 0.7–4.0)
Lymphocytes Relative: 35 %
MCH: 28.1 pg (ref 26.0–34.0)
MCHC: 30.6 g/dL (ref 30.0–36.0)
MCV: 91.9 fL (ref 78.0–100.0)
MONO ABS: 0.3 10*3/uL (ref 0.1–1.0)
Monocytes Relative: 18 %
NEUTROS PCT: 43 %
Neutro Abs: 0.7 10*3/uL — ABNORMAL LOW (ref 1.7–7.7)
PLATELETS: 137 10*3/uL — AB (ref 150–400)
RBC: 2.7 MIL/uL — AB (ref 4.22–5.81)
RDW: 23.5 % — AB (ref 11.5–15.5)
WBC: 1.6 10*3/uL — AB (ref 4.0–10.5)

## 2016-09-24 LAB — URINALYSIS, ROUTINE W REFLEX MICROSCOPIC
GLUCOSE, UA: NEGATIVE mg/dL
Hgb urine dipstick: NEGATIVE
KETONES UR: NEGATIVE mg/dL
LEUKOCYTES UA: NEGATIVE
NITRITE: NEGATIVE
PROTEIN: NEGATIVE mg/dL
Specific Gravity, Urine: 1.014 (ref 1.005–1.030)
pH: 6 (ref 5.0–8.0)

## 2016-09-24 LAB — COMPREHENSIVE METABOLIC PANEL
ALK PHOS: 168 U/L — AB (ref 38–126)
ALT: 7 U/L — ABNORMAL LOW (ref 17–63)
AST: 46 U/L — ABNORMAL HIGH (ref 15–41)
Albumin: 3.2 g/dL — ABNORMAL LOW (ref 3.5–5.0)
Anion gap: 6 (ref 5–15)
BILIRUBIN TOTAL: 4.3 mg/dL — AB (ref 0.3–1.2)
BUN: 10 mg/dL (ref 6–20)
CALCIUM: 9 mg/dL (ref 8.9–10.3)
CO2: 27 mmol/L (ref 22–32)
Chloride: 100 mmol/L — ABNORMAL LOW (ref 101–111)
Creatinine, Ser: 0.53 mg/dL — ABNORMAL LOW (ref 0.61–1.24)
GFR calc non Af Amer: >60 mL/min (ref >60)
Glucose, Bld: 95 mg/dL (ref 65–99)
POTASSIUM: 3.7 mmol/L (ref 3.5–5.1)
Sodium: 133 mmol/L — ABNORMAL LOW (ref 135–145)
TOTAL PROTEIN: 5.6 g/dL — AB (ref 6.5–8.1)

## 2016-09-24 LAB — HEMOGLOBIN AND HEMATOCRIT, BLOOD
HEMATOCRIT: 27.6 % — AB (ref 39.0–52.0)
Hemoglobin: 8.5 g/dL — ABNORMAL LOW (ref 13.0–17.0)

## 2016-09-24 LAB — BRAIN NATRIURETIC PEPTIDE: B NATRIURETIC PEPTIDE 5: 511 pg/mL — AB (ref 0.0–100.0)

## 2016-09-24 LAB — PREPARE RBC (CROSSMATCH)

## 2016-09-24 LAB — TROPONIN I

## 2016-09-24 MED ORDER — FUROSEMIDE 10 MG/ML IJ SOLN
60.0000 mg | Freq: Once | INTRAMUSCULAR | Status: AC
Start: 2016-09-24 — End: 2016-09-24
  Administered 2016-09-24: 60 mg via INTRAVENOUS
  Filled 2016-09-24: qty 6

## 2016-09-24 MED ORDER — ACETAMINOPHEN 325 MG PO TABS: 650.0000 mg | ORAL_TABLET | Freq: Four times a day (QID) | ORAL | Status: DC | PRN

## 2016-09-24 MED ORDER — GABAPENTIN 400 MG PO CAPS
800.0000 mg | ORAL_CAPSULE | Freq: Two times a day (BID) | ORAL | Status: DC
  Administered 2016-09-24: 800 mg via ORAL
  Filled 2016-09-24: qty 2

## 2016-09-24 MED ORDER — DIAZEPAM 2 MG PO TABS
2.0000 mg | ORAL_TABLET | Freq: Every evening | ORAL | Status: DC | PRN
  Administered 2016-09-24: 2 mg via ORAL
  Filled 2016-09-24: qty 1

## 2016-09-24 MED ORDER — POLYVINYL ALCOHOL-POVIDONE PF 1.4-0.6 % OP SOLN: 1.0000 [drp] | Freq: Two times a day (BID) | OPHTHALMIC | Status: DC | PRN

## 2016-09-24 MED ORDER — INSULIN ASPART 100 UNIT/ML ~~LOC~~ SOLN
0.0000 [IU] | Freq: Three times a day (TID) | SUBCUTANEOUS | Status: DC
  Administered 2016-09-24: 1 [IU] via SUBCUTANEOUS

## 2016-09-24 MED ORDER — VITAMIN B-12 1000 MCG PO TABS
500.0000 ug | ORAL_TABLET | Freq: Two times a day (BID) | ORAL | Status: DC
  Administered 2016-09-24 – 2016-09-25 (×3): 500 ug via ORAL
  Filled 2016-09-24 (×3): qty 1

## 2016-09-24 MED ORDER — TRAMADOL HCL 50 MG PO TABS: 50.0000 mg | ORAL_TABLET | Freq: Four times a day (QID) | ORAL | Status: DC | PRN

## 2016-09-24 MED ORDER — FOLIC ACID 1 MG PO TABS
1.0000 mg | ORAL_TABLET | Freq: Every day | ORAL | Status: DC
  Administered 2016-09-25: 1 mg via ORAL
  Filled 2016-09-24: qty 1

## 2016-09-24 MED ORDER — SODIUM CHLORIDE 0.9 % IV SOLN: Freq: Once | INTRAVENOUS | Status: DC

## 2016-09-24 MED ORDER — FUROSEMIDE 10 MG/ML IJ SOLN
20.0000 mg | Freq: Once | INTRAMUSCULAR | Status: AC
  Administered 2016-09-24: 20 mg via INTRAVENOUS
  Filled 2016-09-24: qty 2

## 2016-09-24 MED ORDER — DIGOXIN 125 MCG PO TABS
0.1250 mg | ORAL_TABLET | Freq: Every day | ORAL | Status: DC
  Administered 2016-09-24 – 2016-09-25 (×2): 0.125 mg via ORAL
  Filled 2016-09-24 (×2): qty 1

## 2016-09-24 MED ORDER — DOCUSATE SODIUM 100 MG PO CAPS: 100.0000 mg | ORAL_CAPSULE | Freq: Two times a day (BID) | ORAL | Status: DC | PRN

## 2016-09-24 MED ORDER — GABAPENTIN 400 MG PO CAPS
400.0000 mg | ORAL_CAPSULE | Freq: Two times a day (BID) | ORAL | Status: DC
  Administered 2016-09-24 – 2016-09-25 (×2): 400 mg via ORAL
  Filled 2016-09-24 (×2): qty 1

## 2016-09-24 MED ORDER — FINASTERIDE 5 MG PO TABS
5.0000 mg | ORAL_TABLET | Freq: Every day | ORAL | Status: DC
  Administered 2016-09-25: 5 mg via ORAL
  Filled 2016-09-24: qty 1

## 2016-09-24 MED ORDER — METHYLCELLULOSE 1 % OP SOLN: 1.0000 [drp] | Freq: Every evening | OPHTHALMIC | Status: DC | PRN

## 2016-09-24 MED ORDER — SIMVASTATIN 20 MG PO TABS
20.0000 mg | ORAL_TABLET | Freq: Every day | ORAL | Status: DC
  Administered 2016-09-24: 20 mg via ORAL
  Filled 2016-09-24: qty 1

## 2016-09-24 MED ORDER — PANTOPRAZOLE SODIUM 40 MG PO TBEC
40.0000 mg | DELAYED_RELEASE_TABLET | Freq: Every day | ORAL | Status: DC
  Administered 2016-09-24 – 2016-09-25 (×2): 40 mg via ORAL
  Filled 2016-09-24 (×2): qty 1

## 2016-09-24 MED ORDER — CARBIDOPA-LEVODOPA 25-100 MG PO TABS
3.0000 | ORAL_TABLET | Freq: Four times a day (QID) | ORAL | Status: DC
  Administered 2016-09-24 – 2016-09-25 (×3): 3 via ORAL
  Filled 2016-09-24 (×3): qty 3

## 2016-09-24 MED ORDER — FUROSEMIDE 40 MG PO TABS: 60.0000 mg | ORAL_TABLET | Freq: Every day | ORAL | Status: DC

## 2016-09-24 MED ORDER — SODIUM CHLORIDE 0.9 % IV SOLN
Freq: Once | INTRAVENOUS | Status: AC
  Administered 2016-09-24: 13:00:00 via INTRAVENOUS

## 2016-09-24 MED ORDER — POLYVINYL ALCOHOL 1.4 % OP SOLN
1.0000 [drp] | Freq: Two times a day (BID) | OPHTHALMIC | Status: DC | PRN
  Administered 2016-09-24: 1 [drp] via OPHTHALMIC
  Filled 2016-09-24: qty 15

## 2016-09-24 MED ORDER — GABAPENTIN 800 MG PO TABS
400.0000 mg | ORAL_TABLET | ORAL | Status: DC
Start: 2016-09-24 — End: 2016-09-24

## 2016-09-24 MED ORDER — CYCLOBENZAPRINE HCL 5 MG PO TABS: 5.0000 mg | ORAL_TABLET | Freq: Three times a day (TID) | ORAL | Status: DC | PRN

## 2016-09-24 NOTE — ED Notes (Signed)
Lunch tray ordered: carb modified diet 

## 2016-09-24 NOTE — ED Notes (Signed)
PT ambulated approx 50 feet with 1 staff assist.  Denied increased sob or dizziness, however c/o increasing weakness.  Sats remained 96 % on RA while ambulating.

## 2016-09-24 NOTE — H&P (Signed)
Date: 09/24/2016               Patient Name:  [name] MRN: [id number]  DOB: 1945/10/14 Age / Sex: 71 y.o., male   PCP: Buzzy Han, MD         Medical Service: Internal Medicine Teaching Service         Attending Physician: Dr. Oval Linsey, MD    First Contact: Dr. Ronalee Red Pager: [phone]  Second Contact: Dr. Charlynn Grimes Pager: [phone]       After Hours (After 5p/  First Contact Pager: [phone]  weekends / holidays): Second Contact Pager: [phone]   Chief Complaint: shortness of breath and dizziness  History of Present Illness: Mr. Stiff is a 71yo male with PMH significant for MM (diagnosed July 2017, on active chemotherapy), AVMs (requiring frequent blood transfusions), aortic stenosis s/p porcine AVR 2006 & re-do bioprosthetic AVR 2013 (not on anticoagulation due to chronic GI bleed), HFpEF (last echo 08/2016 with EF 70%), DM2, HTN, HLD, and hyperthyroidism who presents with acute dyspnea at rest that started this morning. Patient states that he was sitting on the couch this morning around 5am and suddenly felt short of breath. He did not take his lasix this morning. Denies associated chest pain or palpitations. He got up to go with his wife to the emergency department but while he was walking in the hallway, he started feeling dizzy, warm, and weak. He felt like he was going to pass out. Wife states that he did not lose consciousness and was able to get back to the couch. His wife called EMS.   His hemoglobin levels are checked twice a week at [facility] and he receives blood transfusions to keep his Hb >8 for chronic GI bleed secondary to AVMs. He is not on anticoagulation for his bioprosthetic AV due to this chronic GI bleed. His last blood transfusion was yesterday (8/17). He reports his Hb was 7.3 and received one unit of blood. He was just admitted 7/13-7/16 for acute onset of dyspnea that was felt to be due to acute on chronic HFpEF. Echo during that admission showed  LVEF 65-70%, biatrial dilation, moderate prosthetic valve stenosis and mild perivalvular regurgitation. He was discharged on 87m lasix daily.  He was placed on O2 for comfort by EMS. His sats remained in the 90s but it seemed to help him. In the ER, temp 97.7, HR 78, BP 94/59, sats 100% on 2L O2, RR 21. Received IV lasix 64mx1. BNP 511, trop <0.03, Hb 7.6,  Typed and screened, will receive 1u pRBC. CXR with no acute cardiopulmonary disease.  On my interview, he reports his shortness of breath has improved. Denies dizziness. He did feel weak after walking ~50 feet with sats remaining 96% on RA. He sleeps on 1 pillow and denies PND. He denies chest pain, palpitations, abdominal pain, fevers. Does have chronic dark tarry stools and chronic leg swelling. His lasix was increased to 6046maily at his last admission. Mr. PucKamaraes not know of any changes that have been made in the last day, except for getting the blood transfusion yesterday and [facility] so it has been warmer in the house recently.  Meds:  Current Meds  Medication Sig  . acetaminophen (TYLENOL) 325 MG tablet Take 650 mg by mouth every 6 (six) hours as needed for fever.   . carbidopa-levodopa (SINEMET IR) 25-100 MG tablet Take 3 tablets by mouth 4 (four) times daily.   .Marland Kitchen  cyanocobalamin 500 MCG tablet Take 500 mcg by mouth 2 (two) times daily. Lunch/dinner  . dexamethasone (DECADRON) 4 MG tablet Take 10 mg by mouth every 7 (seven) days. days 1, 8, 15 and 22 of 28 day cycle.  . diazepam (VALIUM) 2 MG tablet Take 2 mg by mouth at bedtime as needed for anxiety.   . digoxin (LANOXIN) 0.125 MG tablet Take 1 tablet (0.125 mg total) by mouth daily.  Marland Kitchen docusate sodium (COLACE) 100 MG capsule Take 100 mg by mouth 2 (two) times daily as needed for moderate constipation.   . finasteride (PROSCAR) 5 MG tablet Take 5 mg by mouth daily.  . fish oil-omega-3 fatty acids 1000 MG capsule Take 1 g by mouth daily with lunch.   .  folic acid (FOLVITE) 1 MG tablet Take 1 mg by mouth daily.  . furosemide (LASIX) 40 MG tablet Take 1.5 tablets (60 mg total) by mouth daily.  Marland Kitchen gabapentin (NEURONTIN) 800 MG tablet Take 400-800 mg by mouth See admin instructions. Take 1/2 tablet every morning and at bedtime then take 1 tablet at lunch and dinner  . metFORMIN (GLUCOPHAGE) 500 MG tablet Take 500 mg by mouth 2 (two) times daily with a meal.  . methylcellulose (ARTIFICIAL TEARS) 1 % ophthalmic solution Place 1 drop into both eyes at bedtime as needed (dry eyes).  . pantoprazole (PROTONIX) 40 MG tablet Take 1 tablet (40 mg total) by mouth daily. For GERD (Patient taking differently: Take 40 mg by mouth daily with lunch. For GERD)  . Polyvinyl Alcohol-Povidone PF (REFRESH) 1.4-0.6 % SOLN Place 1 drop into both eyes 2 (two) times daily as needed (dry eyes).  . simvastatin (ZOCOR) 40 MG tablet Take 20 mg by mouth at bedtime.   Marland Kitchen UNABLE TO FIND Place 1 application into the right eye daily. Ocular Ointment   . zinc oxide 20 % ointment Apply 1 application topically 2 (two) times daily as needed for irritation.   Allergies: Allergies as of 09/24/2016 - Review Complete 09/24/2016  Allergen Reaction Noted  . Avodart [dutasteride] Other (See Comments) 01/31/2015  . Spironolactone Other (See Comments) 01/31/2015  . Codeine Other (See Comments)   . Diazepam Other (See Comments) 01/31/2015  . Doxazosin Other (See Comments) 01/31/2015  . Feraheme [ferumoxytol] Swelling 11/15/2011   Past Medical History:  Diagnosis Date  . Allergic rhinitis   . Anemia 2009  . Aortic stenosis    a. s/p porcine AVR 2006;  b. s/p redo tissue AVR 12/2011 (Dr. Roxy Manns) - preAVR LHC with no CAD  . Asthma   . BPH (benign prostatic hypertrophy)   . Cancer ([place])    multiple myeloma  . Cataract   . Chronic cough   . Chronic interstitial cystitis   . Depression    pt denies  . Diabetes mellitus    type 2  . Diabetes mellitus without complication ([place])   .  Diverticulosis of colon    on colonoscopy 2008  . GERD (gastroesophageal reflux disease)    bravo pH study 2008  . Gout   . H/O aortic valve replacement with porcine valve    2006, 2013  . Headache(784.0)   . Hemorrhoids    external and internal  . Hiatal hernia   . HTN (hypertension)   . Hx of echocardiogram    a. Echo  (post AVR) 12/2011:  mod LVH, EF 60-65%, Gr 2 diast dysfn, mild AI, AVR ok (mean gradient 19 mmHg), MAC, mild BAE  . Hyperlipidemia   .  Hypertension   . Hyperthyroidism   . IBS (irritable bowel syndrome)   . Insomnia   . Lower GI bleed 07/16/2015  . OA (osteoarthritis)   . OSA (obstructive sleep apnea)    USES CPAP AS NEEDED  . 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 Periodontitis    chronic with bone loss  . Restless leg syndrome   . S/P aortic valve replacement with bioprosthetic valve 12/06/2011   Redo AVR using 23 mm Edwards Magna Ease pericardial tissue valve   Family History:  Family History  Problem Relation Age of Onset  . Heart disease Father   . Osteoarthritis Mother   . Hypertension Sister   . Hyperlipidemia Unknown   . Cancer Maternal Aunt    Social History: Social History   Social History  . Marital status: Married    Spouse name: N/A  . Number of children: 0  . Years of education: N/A   Occupational History  . retired    Social History Main Topics  . Smoking status: Former Smoker    Quit date: 09/30/1971  . Smokeless tobacco: Never Used     Comment: Quit August 973  . Alcohol use No  . Drug use: No  . Sexual activity: Not Currently   Other Topics Concern  . None   Social History Narrative   ** Merged History Encounter **       Review of Systems: A complete ROS was negative except as per HPI.  Physical Exam: Blood pressure 122/73, pulse 70, temperature 97.7 F (36.5 C), temperature source Oral, resp. rate 17, height _0  (1.753 m), weight 188 lb (85.3 kg), SpO2 (!) 69 %.  GEN: Elderly gentleman  sitting up in bed comfortably in no acute distress. Alert and oriented. Appears pale/yellow. EYES: EOMI. Sclera mildly icteric. Conjunctivae without pallor. RESP: Clear to auscultation bilaterally. No wheezes CV: Irregular rate and rhythm. Holosystolic murmur, best heard at RUSB. 3+ BLE edema, which is chronic EXT: 3+ BLE edema (chronic). Warm. SKIN: Decreased skin turgor, capillary refill >2 sec NEURO: Cranial nerves II-XII grossly intact. Able to lift all four extremities against gravity. PSYCH: Patient is calm and pleasant. Appropriate affect. Well-groomed; speech is appropriate and on-subject.  Labs CBC Latest Ref Rng & Units 09/24/2016 08/22/2016 08/21/2016  WBC 4.0 - 10.5 K/uL 1.6(L) 2.2(L) 1.8(L)  Hemoglobin 13.0 - 17.0 g/dL 7.6(L) 9.8(L) 8.0(L)  Hematocrit 39.0 - 52.0 % 24.8(L) 31.9(L) 26.3(L)  Platelets 150 - 400 K/uL 137(L) 71(L) 78(L)   CMP Latest Ref Rng & Units 09/24/2016 08/22/2016 08/21/2016  Glucose 65 - 99 mg/dL 95 101(H) 106(H)  BUN 6 - 20 mg/dL _1 Creatinine 0.61 - 1.24 mg/dL 0.53(L) 0.50(L) 0.59(L)  Sodium 135 - 145 mmol/L 133(L) 131(L) 132(L)  Potassium 3.5 - 5.1 mmol/L 3.7 4.0 3.5  Chloride 101 - 111 mmol/L 100(L) 103 102  CO2 22 - 32 mmol/L _2 Calcium 8.9 - 10.3 mg/dL 9.0 9.6 9.2  Total Protein 6.5 - 8.1 g/dL 5.6(L) - -  Total Bilirubin 0.3 - 1.2 mg/dL 4.3(H) - -  Alkaline Phos 38 - 126 U/L 168(H) - -  AST 15 - 41 U/L 46(H) - -  ALT 17 - 63 U/L 7(L) - -   UA with small bilirubin BNP 511 Trop <0.03  EKG: atrial flutter  CXR: No acute cardiopulmonary disease.  Assessment & Plan by Problem: Active Problems:   Shortness of breath  Mr. Bonaventure is a 71yo male  with PMH significant for MM (diagnosed July 2017, on active chemotherapy), AVMs (requiring frequent blood transfusions), aortic stenosis s/p porcine AVR 2006 & re-do bioprosthetic AVR 2013 (not on anticoagulation due to chronic GI bleed), HFpEF, DM2, HTN, HLD, and hyperthyroidism who presents  with acute dyspnea and dizziness one day after blood transfusion for Hb 7.3.  # Shortness of breath and weakness Patient endorses acute dyspnea at rest that began this morning, which has now improved. His sats have remained above 95% on RA and he denies current dyspnea. Troponin negative. EKG with atrial flutter. DDx: Chronic anemia vs acute on chronic CHF Repeat Hb today is 7.6 after 1u of blood yesterday. His goal is Hb >8. He has a chronic GI bleed 2/2 AVMs and is still oozing with continued dark tarry stools. He also has a tbili of 4.3 and appears fairly yellow, so hemolysis from his prosthetic valve may be contributing to his anemia. An alternative diagnosis, which I think is less likely is acute exacerbation of chronic CHF. His BNP is 511, increased from the 300s on his last admission for acute exacerbation of CHF. However, his CXR does not demonstrate increased pulmonary edema or effusions. He does have chronic BLE edema, however, I do not appreciate crackles on exam. Nevertheless, his reduced cardiac function may be contributing to his dyspnea. This weakness and SOB may have further been exacerbated by dehydration and hotter temperatures in his house 2/2 nonfunctioning A/C. He notes feeling thirsty and that his mouth is dry. His volume status will likely be difficult to balance - He did already receive 1 dose of lasix in the ED. We will give 1u of blood and then re-assess for need for lasix after transfusion. Another consideration that is less likely is PE. He is in atrial flutter and cannot be on anticoagulation due to GI bleed, however this diagnosis is less likely given that he denies pleuritic chest pain, is not tachycardic, does not have note unilateral leg swelling, and has not had increased oxygen requirement. - type and screen - 1u pRBC - consider lasix post transfusion - re-check CBC after transfusion - goal Hb >8 - CBC tomorrow AM - BMET tomorrow AM  # Dizziness Has not had  recurrence of dizziness since this morning. Possibly related to dehydration 2/2 broken AC in setting of lasix 33m daily. Another consideration is his perivalvular regurgitation or stenosis. His cardiologist is following this and has told the patient that the regurgitation is mild. - Orthostatics - 1u pRBC - BMET tomorrow AM  # Atrial flutter, rate controlled CHADS2-VASc score is at least 4 with a 4.8% yearly risk of stroke. He is not currently a candidate for anticoagulation due to his chronic GI bleed 2/2 AVM. - continue home digoxin 0.1258mdaily - telemetry  # HFpEF BNP 511, CXR without pulmonary edema, s/p 1 dose of Lasix in the ED - restart home lasix 6014maily tomorrow - daily weights - strict I/Os  # DM2 - hold home metformin - SSI - CBG monitoring  # HLD - continue home simvastatin  # FEN/GI - Diabetic diet - continue home pantoprazole 45m21mily  VTE PPx: SCDs Code status: Full Dispo: Admit patient to Observation with expected length of stay less than 2 midnights.  Signed: HuanColbert Ewing 09/24/2016, 11:43 AM  Pager: P 33Mamie [phone]

## 2016-09-24 NOTE — ED Triage Notes (Signed)
Pt here from home via [facility] for sob and dizziness.  Pt was ambulating to bathroom and began experiencing sob and dizziness.  Sob did not improve when he sat down.  When EMS arrived sats were 99%, but pt was hypotensive in 90's.  Given 200 ml en-route.  Given 1 unit blood yesterday for chronic anemia r/t chemo.

## 2016-09-24 NOTE — Discharge Summary (Signed)
Name: [name] MRN: [id number] DOB: Nov 13, 1945 71 y.o. PCP: Danny Han, MD  Date of Admission: 09/24/2016  8:08 AM Date of Discharge: 09/25/2016 Attending Physician: Danny Linsey, MD  Discharge Diagnosis: 1. Dyspnea, secondary to anemia and dehydration  Active Problems:   Shortness of breath   Discharge Medications: Allergies as of 09/25/2016      Reactions   Avodart [dutasteride] Other (See Comments)   Lose of use of arms and legs   Spironolactone Other (See Comments)   Low tolerance    Codeine Other (See Comments)   GI upset in large doses   Diazepam Other (See Comments)   Mood changes   Doxazosin Other (See Comments)   Dizziness   Feraheme [ferumoxytol] Swelling   Pedal edema      Medication List    TAKE these medications   acetaminophen 325 MG tablet Commonly known as:  TYLENOL Take 650 mg by mouth every 6 (six) hours as needed for fever.   carbidopa-levodopa 25-100 MG tablet Commonly known as:  SINEMET IR Take 3 tablets by mouth 4 (four) times daily.   cyanocobalamin 500 MCG tablet Take 500 mcg by mouth 2 (two) times daily. Lunch/dinner   cyclobenzaprine 10 MG tablet Commonly known as:  FLEXERIL Take 5 mg by mouth 3 (three) times daily as needed for muscle spasms.   dexamethasone 4 MG tablet Commonly known as:  DECADRON Take 10 mg by mouth every 7 (seven) days. days 1, 8, 15 and 22 of 28 day cycle.   diazepam 2 MG tablet Commonly known as:  VALIUM Take 2 mg by mouth at bedtime as needed for anxiety.   digoxin 0.125 MG tablet Commonly known as:  LANOXIN Take 1 tablet (0.125 mg total) by mouth daily.   docusate sodium 100 MG capsule Commonly known as:  COLACE Take 100 mg by mouth 2 (two) times daily as needed for moderate constipation.   finasteride 5 MG tablet Commonly known as:  PROSCAR Take 5 mg by mouth daily.   fish oil-omega-3 fatty acids 1000 MG capsule Take 1 g by mouth daily with lunch.   folic acid 1 MG  tablet Commonly known as:  FOLVITE Take 1 mg by mouth daily.   furosemide 40 MG tablet Commonly known as:  LASIX Take 1.5 tablets (60 mg total) by mouth daily.   gabapentin 800 MG tablet Commonly known as:  NEURONTIN Take 400-800 mg by mouth See admin instructions. Take 1/2 tablet every morning and at bedtime then take 1 tablet at lunch and dinner   hydrocortisone 2.5 % rectal cream Commonly known as:  ANUSOL-HC Place 1 application rectally 2 (two) times daily.   metFORMIN 500 MG tablet Commonly known as:  GLUCOPHAGE Take 500 mg by mouth 2 (two) times daily with a meal.   methylcellulose 1 % ophthalmic solution Commonly known as:  ARTIFICIAL TEARS Place 1 drop into both eyes at bedtime as needed (dry eyes).   octreotide 100 MCG/ML Soln injection Commonly known as:  SANDOSTATIN Inject 100 mcg into the skin every 12 (twelve) hours. .25 mL twice daily for 14 days---Starting 08/10/16   pantoprazole 40 MG tablet Commonly known as:  PROTONIX Take 1 tablet (40 mg total) by mouth daily. For GERD What changed:  when to take this  additional instructions   pomalidomide 4 MG capsule Commonly known as:  POMALYST Take 4 mg by mouth See admin instructions. Take with water on days 1-21 then  PATIENT IS OFF MED FOR 7  DAYS, Repeat every 28 days.   REFRESH 1.4-0.6 % Soln Generic drug:  Polyvinyl Alcohol-Povidone PF Place 1 drop into both eyes 2 (two) times daily as needed (dry eyes).   simvastatin 40 MG tablet Commonly known as:  ZOCOR Take 20 mg by mouth at bedtime.   traMADol 50 MG tablet Commonly known as:  ULTRAM Take 50 mg by mouth every 6 (six) hours as needed for moderate pain.   UNABLE TO FIND Place 1 application into the right eye daily. Ocular Ointment   zinc oxide 20 % ointment Apply 1 application topically 2 (two) times daily as needed for irritation.       Disposition and follow-up:   [name] was discharged from [facility] in Good  condition.  At the hospital follow up visit please address:  1.   - Any more episodes of dyspnea or dizziness?  2.  Labs / imaging needed at time of follow-up: None  3.  Pending labs/ test needing follow-up: None  Follow-up Appointments: [facility] PCP on Tuesday Hematologist on Wednesday  Hospital Course by problem list: Active Problems:   Shortness of breath   1. Dyspnea, likely secondary to anemia and dehydration Patient presented on 8/18 with acute shortness of breath at rest, as well as weakness and dizziness. 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 He also states that his home A/C is broken and that he had a dry mouth and was feeling warm. EMS placed him on oxygen, which resolved his symptoms, although his sats remained >90% throughout. BNP 511. CXR without acute abnormality. Did not seem volume overloaded on exam. Hgb was 7.6, suggesting that he was dyspneic secondary to anemia in the setting of chronic HFpEF and dehydration with a nonfunctioning home A/C. He received 1u transfusion and reported improvement in his dyspnea and weakness. Repeat Hgb was 7.9. Patient declined another unit of pRBC because he is about to see hematologist on Wednesday. He also states that his A/C should be fixed tomorrow. He had no further episodes of dyspnea or dizziness while in the hospital.  2. HFpEF He was recently discharged on 7/16 for what was felt to be acute exacerbation of chronic HFpEF. He was diuresed during that admission and discharged on Lasix 60mg  qday. BNP on this admission was 511. He did not seem volume overloaded on physical exam and CXR was without acute abnormality. He did not take his lasix the morning that he felt dyspneic, so his reduced cardiac function may have been a contributory factor to his initial presentation. We are discharging him home on his home dose of 60mg  lasix daily.  3. Atrial flutter,  rate controlled CHADS2-VASc score is at least 4 with a 4.8% yearly risk of stroke. He is not currently a candidate for anticoagulation due to his chronic GI bleed 2/2 AVM. He remained in atrial flutter throughout his admission with HR in the 70s. He was discharged on his home digoxin.  Discharge Vitals:   BP 115/69 (BP Location: Right Arm)   Pulse 71   Temp 98.2 F (36.8 C) (Oral)   Resp 18   Ht 5\' 9"  (1.753 m)   Wt 177 lb 4.8 oz (80.4 kg)   SpO2 98%   BMI 26.18 kg/m   Pertinent Labs, Studies, and Procedures:  CBC Latest Ref Rng & Units 09/25/2016 09/24/2016 09/24/2016  WBC 4.0 - 10.5 K/uL 1.8(L) -  1.6(L)  Hemoglobin 13.0 - 17.0 g/dL 7.9(L) 8.5(L) 7.6(L)  Hematocrit 39.0 - 52.0 % 25.7(L) 27.6(L) 24.8(L)  Platelets 150 - 400 K/uL 94(L) - 137(L)   CMP Latest Ref Rng & Units 09/25/2016 09/24/2016 08/22/2016  Glucose 65 - 99 mg/dL 91 95 101(H)  BUN 6 - 20 mg/dL 10 10 11   Creatinine 0.61 - 1.24 mg/dL 0.55(L) 0.53(L) 0.50(L)  Sodium 135 - 145 mmol/L 132(L) 133(L) 131(L)  Potassium 3.5 - 5.1 mmol/L 3.5 3.7 4.0  Chloride 101 - 111 mmol/L 100(L) 100(L) 103  CO2 22 - 32 mmol/L 23 27 22   Calcium 8.9 - 10.3 mg/dL 9.1 9.0 9.6  Total Protein 6.5 - 8.1 g/dL - 5.6(L) -  Total Bilirubin 0.3 - 1.2 mg/dL - 4.3(H) -  Alkaline Phos 38 - 126 U/L - 168(H) -  AST 15 - 41 U/L - 46(H) -  ALT 17 - 63 U/L - 7(L) -   Trop <0.03 BNP 511 UA small bilirubin  Discharge Instructions: Discharge Instructions    Call MD for:  difficulty breathing, headache or visual disturbances    Complete by:  As directed    Call MD for:  extreme fatigue    Complete by:  As directed    Call MD for:  persistant dizziness or light-headedness    Complete by:  As directed    Call MD for:  severe uncontrolled pain    Complete by:  As directed    Call MD for:  temperature >100.4    Complete by:  As directed    Diet - low sodium heart healthy    Complete by:  As directed    Increase activity slowly    Complete by:  As  directed       Signed: Colbert Ewing, MD 09/25/2016, 10:01 AM   Pager: Mamie Nick [phone]

## 2016-09-24 NOTE — ED Notes (Signed)
Patient transported to X-ray 

## 2016-09-24 NOTE — ED Notes (Signed)
ED Provider at bedside. 

## 2016-09-24 NOTE — ED Provider Notes (Signed)
[facility] DEPT Provider Note   CSN: [id number] Arrival date & time: 09/24/16  0808     History   Chief Complaint Chief Complaint  Patient presents with  . Shortness of Breath  . Dizziness    HPI [name] is a 71 y.o. male.  HPI  71 year old male with history of multiple myeloma and frequent blood transfusions due to chronic GI blood loss presents with dyspnea. Started around 3 am when walking back to from the bathroom. Still feels dyspneic despite resting. Has leg swelling but feels it's a little improved. He is on lasix 60 mg daily. Was placed on O2 for comfort by EMS, though sats were in 90s but it seemed to help him. No chest pain or abdominal pain. Had some left back pain earlier in the week, seemed to go away when he had a BM yesterday. Last had a blood transfusion by his oncologist yesterday (1 unit).  Past Medical History:  Diagnosis Date  . Allergic rhinitis   . Anemia 2009  . Aortic stenosis    a. s/p porcine AVR 2006;  b. s/p redo tissue AVR 12/2011 (Dr. Roxy Manns) - preAVR LHC with no CAD  . Asthma   . BPH (benign prostatic hypertrophy)   . Cancer ([place])    multiple myeloma  . Cataract   . Chronic cough   . Chronic interstitial cystitis   . Depression    pt denies  . Diabetes mellitus    type 2  . Diabetes mellitus without complication ([place])   . Diverticulosis of colon    on colonoscopy 2008  . GERD (gastroesophageal reflux disease)    bravo pH study 2008  . Gout   . H/O aortic valve replacement with porcine valve    2006, 2013  . Headache(784.0)   . Hemorrhoids    external and internal  . Hiatal hernia   . HTN (hypertension)   . Hx of echocardiogram    a. Echo  (post AVR) 12/2011:  mod LVH, EF 60-65%, Gr 2 diast dysfn, mild AI, AVR ok (mean gradient 19 mmHg), MAC, mild BAE  . Hyperlipidemia   . Hypertension   . Hyperthyroidism   . IBS (irritable bowel syndrome)   . Insomnia   . Lower GI bleed 07/16/2015  . OA (osteoarthritis)   . OSA  (obstructive sleep apnea)    USES CPAP AS NEEDED  . 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 Periodontitis    chronic with bone loss  . Restless leg syndrome   . S/P aortic valve replacement with bioprosthetic valve 12/06/2011   Redo AVR using 23 mm [facility] Ease pericardial tissue valve    Patient Active Problem List   Diagnosis Date Noted  . Shortness of breath 09/24/2016  . Dyspnea 08/20/2016  . Pancytopenia ([place]) 08/20/2016  . Type II diabetes mellitus with complication ([place]) 46/50/3546  . Hypoxia 08/20/2016  . CHF (congestive heart failure) ([place]) 08/20/2016  . Sepsis, unspecified organism ([place]) 03/23/2016  . Acute on chronic diastolic congestive heart failure ([place]) 03/23/2016  . Influenza A 03/23/2016  . Acute respiratory failure ([place]) 03/23/2016  . 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 Rectal bleeding   . Internal bleeding hemorrhoids   . Symptomatic anemia 07/16/2015  . GI bleed 07/16/2015  . Hemorrhoids 07/16/2015  . Multiple myeloma not having achieved remission ([place])   . Syncope and collapse   . Acute blood loss anemia 03/17/2015  . Bruit 01/19/2015  .  S/P aortic valve replacement with bioprosthetic valve 12/06/2011  . Aortic stenosis 11/30/2011  . Chronic daily headache 11/30/2011  . Iron deficiency anemia, unspecified 09/15/2011  . IBS (irritable bowel syndrome) 09/15/2011  . Syncope 08/29/2011  . Cough syncope 10/13/2010  . HYPERLIPIDEMIA-MIXED 07/02/2008  . Essential hypertension 07/02/2008  . GERD 07/02/2008  . OBSTRUCTIVE SLEEP APNEA 01/15/2007  . ALLERGIC  RHINITIS 01/15/2007  . INSOMNIA 01/15/2007  . OSTEOARTHRITIS 12/02/2006  . BPH (benign prostatic hyperplasia) 12/02/2006  . H/O aortic valve replacement 12/02/2006    Past Surgical History:  Procedure Laterality Date  . AORTA - FEMORAL ARTERY BYPASS GRAFT    . AORTIC VALVE REPLACEMENT  10/13/2004   48m [place] pericardial tissue valve  .  AORTIC VALVE REPLACEMENT  12/06/2011   Procedure: REDO AORTIC VALVE REPLACEMENT (AVR);  Surgeon: CRexene Alberts MD;  [facility]  Service: Open Heart Surgery;  Laterality: N/A;  . BUNIONECTOMY     right  . CARDIAC SURGERY     aorta vavle replacement  . CATARACT EXTRACTION  2009    &   2012   BILATERAL  . COLONOSCOPY  08/31/2011   Procedure: COLONOSCOPY;  Surgeon: RInda Castle MD;  [facility]  Service: Endoscopy;  Laterality: N/A;  . ESOPHAGOGASTRODUODENOSCOPY  08/30/2011   Procedure: ESOPHAGOGASTRODUODENOSCOPY (EGD);  Surgeon: RInda Castle MD;  [facility]  Service: Endoscopy;  Laterality: N/A;  Rm 3005   . HERNIA REPAIR    . JOINT REPLACEMENT     knee  . LEFT HEART CATHETERIZATION WITH CORONARY ANGIOGRAM N/A 11/30/2011   Procedure: LEFT HEART CATHETERIZATION WITH CORONARY ANGIOGRAM;  Surgeon: CBurnell Blanks MD;  [facility];  Service: Cardiovascular;  Laterality: N/A;  . NASAL SEPTOPLASTY W/ TURBINOPLASTY    . REFRACTIVE SURGERY     bilateral  . RIGHT HEART CATHETERIZATION Bilateral 11/30/2011   Procedure: RIGHT HEART CATH;  Surgeon: CBurnell Blanks MD;  [facility];  Service: Cardiovascular;  Laterality: Bilateral;  . right knee arthroscopy    . TEE WITHOUT CARDIOVERSION N/A 10/27/2015   Procedure: TRANSESOPHAGEAL ECHOCARDIOGRAM (TEE);  Surgeon: BLelon Perla MD;  [facility];  Service: Cardiovascular;  Laterality: N/A;       Home Medications    Prior to Admission medications   Medication Sig Start Date End Date Taking? Authorizing Provider  acetaminophen (TYLENOL) 325 MG tablet Take 650 mg by mouth every 6 (six) hours as needed for fever.    Yes [provider]  carbidopa-levodopa (SINEMET IR) 25-100 MG tablet Take 3 tablets by mouth 4 (four) times daily.    Yes [provider]  cyanocobalamin 500 MCG tablet Take 500 mcg by mouth 2 (two) times daily. Lunch/dinner   Yes  [provider]  dexamethasone (DECADRON) 4 MG tablet Take 10 mg by mouth every 7 (seven) days. days 1, 8, 15 and 22 of 28 day cycle. 11/19/15  Yes [provider]  diazepam (VALIUM) 2 MG tablet Take 2 mg by mouth at bedtime as needed for anxiety.    Yes [provider]  digoxin (LANOXIN) 0.125 MG tablet Take 1 tablet (0.125 mg total) by mouth daily. 08/23/16  Yes Gherghe, CVella Redhead MD  docusate sodium (COLACE) 100 MG capsule Take 100 mg by mouth 2 (two) times daily as needed for moderate constipation.  08/20/15  Yes [provider]  finasteride (PROSCAR) 5 MG tablet Take 5 mg by mouth daily.   Yes [provider]  fish oil-omega-3 fatty acids 1000 MG capsule Take 1 g by mouth daily with lunch.    Yes [provider]  folic acid (FOLVITE) 1 MG tablet Take 1 mg by mouth daily. 09/24/15  Yes [provider]  furosemide (LASIX) 40 MG tablet Take 1.5 tablets (60 mg total) by mouth daily. 08/15/16  Yes Lelon Perla, MD  gabapentin (NEURONTIN) 800 MG tablet Take 400-800 mg by mouth See admin instructions. Take 1/2 tablet every morning and at bedtime then take 1 tablet at lunch and dinner   Yes [provider]  metFORMIN (GLUCOPHAGE) 500 MG tablet Take 500 mg by mouth 2 (two) times daily with a meal.   Yes [provider]  methylcellulose (ARTIFICIAL TEARS) 1 % ophthalmic solution Place 1 drop into both eyes at bedtime as needed (dry eyes).   Yes [provider]  pantoprazole (PROTONIX) 40 MG tablet Take 1 tablet (40 mg total) by mouth daily. For GERD Patient taking differently: Take 40 mg by mouth daily with lunch. For GERD 07/16/15  Yes Rai, Ripudeep K, MD  Polyvinyl Alcohol-Povidone PF (REFRESH) 1.4-0.6 % SOLN Place 1 drop into both eyes 2 (two) times daily as needed (dry eyes).   Yes [provider]  simvastatin (ZOCOR) 40 MG tablet Take 20 mg by mouth at bedtime.  09/24/15  Yes [provider]    UNABLE TO FIND Place 1 application into the right eye daily. Ocular Ointment    Yes [provider]  zinc oxide 20 % ointment Apply 1 application topically 2 (two) times daily as needed for irritation.   Yes [provider]  cyclobenzaprine (FLEXERIL) 10 MG tablet Take 5 mg by mouth 3 (three) times daily as needed for muscle spasms.    [provider]  hydrocortisone (ANUSOL-HC) 2.5 % rectal cream Place 1 application rectally 2 (two) times daily.    [provider]  octreotide (SANDOSTATIN) 100 MCG/ML SOLN injection Inject 100 mcg into the skin every 12 (twelve) hours. .25 mL twice daily for 14 days---Starting 08/10/16    [provider]  pomalidomide (POMALYST) 4 MG capsule Take 4 mg by mouth See admin instructions. Take with water on days 1-21 then  PATIENT IS OFF MED FOR 7 DAYS, Repeat every 28 days.    [provider]  traMADol (ULTRAM) 50 MG tablet Take 50 mg by mouth every 6 (six) hours as needed for moderate pain.    [provider]    Family History Family History  Problem Relation Age of Onset  . Heart disease Father   . Osteoarthritis Mother   . Hypertension Sister   . Hyperlipidemia Unknown   . Cancer Maternal Aunt     Social History Social History  Substance Use Topics  . Smoking status: Former Smoker    Quit date: 09/30/1971  . Smokeless tobacco: Never Used     Comment: Quit August 973  . Alcohol use No     Allergies   Avodart [dutasteride]; Spironolactone; Codeine; Diazepam; Doxazosin; and Feraheme [ferumoxytol]   Review of Systems Review of Systems  Respiratory: Positive for shortness of breath.   Cardiovascular: Positive for leg swelling.  Gastrointestinal: Negative for abdominal pain.  Genitourinary: Negative for flank pain (earlier in the week, none now).  All other systems reviewed and are negative.    Physical Exam Updated Vital Signs BP 122/66 (BP Location: Left Arm)   Pulse 73   Temp  97.6 F (36.4 C) (Oral)  Resp 18   Ht _0  (1.753 m)   Wt 81.2 kg (179 lb 1.6 oz)   SpO2 100%   BMI 26.45 kg/m   Physical Exam  Constitutional: He is oriented to person, place, and time. He appears well-developed and well-nourished. No distress.  HENT:  Head: Normocephalic and atraumatic.  Right Ear: External ear normal.  Left Ear: External ear normal.  Nose: Nose normal.  Eyes: Right eye exhibits no discharge. Left eye exhibits no discharge.  Neck: Neck supple.  Cardiovascular: Normal rate and regular rhythm.   Murmur heard. Pulmonary/Chest: Effort normal. No accessory muscle usage. No tachypnea. He has decreased breath sounds in the right lower field and the left lower field.  Abdominal: Soft. There is no tenderness.  Musculoskeletal: He exhibits edema (BLE pitting edema).  Neurological: He is alert and oriented to person, place, and time.  Skin: Skin is warm and dry. He is not diaphoretic. There is pallor.  Nursing note and vitals reviewed.    ED Treatments / Results  Labs (all labs ordered are listed, but only abnormal results are displayed) Labs Reviewed  COMPREHENSIVE METABOLIC PANEL - Abnormal; Notable for the following:       Result Value   Sodium 133 (*)    Chloride 100 (*)    Creatinine, Ser 0.53 (*)    Total Protein 5.6 (*)    Albumin 3.2 (*)    AST 46 (*)    ALT 7 (*)    Alkaline Phosphatase 168 (*)    Total Bilirubin 4.3 (*)    All other components within normal limits  BRAIN NATRIURETIC PEPTIDE - Abnormal; Notable for the following:    B Natriuretic Peptide 511.0 (*)    All other components within normal limits  CBC WITH DIFFERENTIAL/PLATELET - Abnormal; Notable for the following:    WBC 1.6 (*)    RBC 2.70 (*)    Hemoglobin 7.6 (*)    HCT 24.8 (*)    RDW 23.5 (*)    Platelets 137 (*)    Neutro Abs 0.7 (*)    Lymphs Abs 0.6 (*)    All other components within normal limits  URINALYSIS, ROUTINE W REFLEX MICROSCOPIC - Abnormal; Notable for the  following:    Color, Urine AMBER (*)    Bilirubin Urine SMALL (*)    All other components within normal limits  HEMOGLOBIN AND HEMATOCRIT, BLOOD - Abnormal; Notable for the following:    Hemoglobin 8.5 (*)    HCT 27.6 (*)    All other components within normal limits  GLUCOSE, CAPILLARY - Abnormal; Notable for the following:    Glucose-Capillary 122 (*)    All other components within normal limits  TROPONIN I  BASIC METABOLIC PANEL  CBC  TYPE AND SCREEN  PREPARE RBC (CROSSMATCH)    EKG  EKG Interpretation  Date/Time:  Saturday September 24 2016 08:15:12 EDT Ventricular Rate:  66 PR Interval:    QRS Duration: 98 QT Interval:  459 QTC Calculation: 481 R Axis:   -24 Text Interpretation:  Atrial flutter Ventricular premature complex Borderline left axis deviation Probable lateral infarct, age indeterminate Confirmed by Sherwood Gambler [phone]) on 09/24/2016 8:27:23 AM       Radiology Dg Chest 2 View  Result Date: 09/24/2016 CLINICAL DATA:  Pt reports shortness of breath onset around 0600 this AM; dizziness earlier this AM but not now; former smoker; hx of DM and aortic valve replacement 2013 EXAM: CHEST  2 VIEW COMPARISON:  Chest CT  and chest radiographs, 08/19/2016 FINDINGS: Changes from cardiac surgery are stable. Cardiac silhouette is normal in size and configuration. No mediastinal or hilar masses. No evidence of adenopathy. Lung volumes are low. Mild linear lung base opacity is noted consistent scarring or atelectasis, stable. Lungs are otherwise clear. No pleural effusion or pneumothorax. Skeletal structures are demineralized but grossly intact. IMPRESSION: No acute cardiopulmonary disease. Electronically Signed   By: Lajean Manes M.D.   On: 09/24/2016 09:04    Procedures Procedures (including critical care time)  CRITICAL CARE Performed by: Sherwood Gambler T   Total critical care time: 30 minutes  Critical care time was exclusive of separately billable procedures and  treating other patients.  Critical care was necessary to treat or prevent imminent or life-threatening deterioration.  Critical care was time spent personally by me on the following activities: development of treatment plan with patient and/or surrogate as well as nursing, discussions with consultants, evaluation of patient's response to treatment, examination of patient, obtaining history from patient or surrogate, ordering and performing treatments and interventions, ordering and review of laboratory studies, ordering and review of radiographic studies, pulse oximetry and re-evaluation of patient's condition.   Medications Ordered in ED Medications  0.9 %  sodium chloride infusion ( Intravenous Not Given 09/24/16 1236)  vitamin B-12 (CYANOCOBALAMIN) tablet 500 mcg (500 mcg Oral Given 09/24/16 1704)  digoxin (LANOXIN) tablet 0.125 mg (0.125 mg Oral Given 09/24/16 1703)  acetaminophen (TYLENOL) tablet 650 mg (not administered)  furosemide (LASIX) tablet 60 mg (not administered)  traMADol (ULTRAM) tablet 50 mg (not administered)  cyclobenzaprine (FLEXERIL) tablet 5 mg (not administered)  diazepam (VALIUM) tablet 2 mg (not administered)  docusate sodium (COLACE) capsule 100 mg (not administered)  folic acid (FOLVITE) tablet 1 mg (not administered)  simvastatin (ZOCOR) tablet 20 mg (not administered)  pantoprazole (PROTONIX) EC tablet 40 mg (40 mg Oral Given 09/24/16 1702)  carbidopa-levodopa (SINEMET IR) 25-100 MG per tablet immediate release 3 tablet ( Oral Canceled Entry 09/24/16 1704)  finasteride (PROSCAR) tablet 5 mg (not administered)  insulin aspart (novoLOG) injection 0-9 Units (1 Units Subcutaneous Given 09/24/16 1704)  polyvinyl alcohol (LIQUIFILM TEARS) 1.4 % ophthalmic solution 1 drop (not administered)  gabapentin (NEURONTIN) capsule 400 mg (not administered)  gabapentin (NEURONTIN) capsule 800 mg (800 mg Oral Given 09/24/16 1703)  furosemide (LASIX) injection 60 mg (60 mg Intravenous  Given 09/24/16 1015)  0.9 %  sodium chloride infusion ( Intravenous Stopped 09/24/16 1447)  furosemide (LASIX) injection 20 mg (20 mg Intravenous Given 09/24/16 1459)     Initial Impression / Assessment and Plan / ED Course  I have reviewed the triage vital signs and the nursing notes.  Pertinent labs & imaging results that were available during my care of the patient were reviewed by me and considered in my medical decision making (see chart for details).     Patient's dyspnea is improved. I think there is a degree of mild CHF. However he also states he is transfused to get to hgb of 8, which he is now 7.6. Thus will transfuse 1 unit, also give lasix. Given this, he will need to be observed. Internal medicine teaching service to admit.   Final Clinical Impressions(s) / ED Diagnoses   Final diagnoses:  Symptomatic anemia    New Prescriptions Current Discharge Medication List       Sherwood Gambler, MD 09/24/16 [phone]

## 2016-09-24 NOTE — Progress Notes (Addendum)
New pt admission from ED. Pt brought to the floor in stable condition. Vitals taken. Initial Assessment done. All immediate pertinent needs to patient addressed. Patient Guide given to patient. Important safety instructions relating to hospitalization reviewed with patient. Patient verbalized understanding. Pt is done with 1 unit of blood transfusion in ED, prescribed medicine given, donot feel comfortable for orthostatic vitals now, will reassess. CBG is 122, pt refuse 1 unit insulin this time, will continue to monitor

## 2016-09-24 NOTE — ED Notes (Signed)
Pt returns from radiology. 

## 2016-09-25 ENCOUNTER — Encounter (HOSPITAL_COMMUNITY): Payer: Self-pay | Admitting: *Deleted

## 2016-09-25 DIAGNOSIS — Q2733 Arteriovenous malformation of digestive system vessel: Secondary | ICD-10-CM | POA: Diagnosis not present

## 2016-09-25 DIAGNOSIS — I4892 Unspecified atrial flutter: Secondary | ICD-10-CM | POA: Diagnosis not present

## 2016-09-25 DIAGNOSIS — R0609 Other forms of dyspnea: Secondary | ICD-10-CM

## 2016-09-25 DIAGNOSIS — I5032 Chronic diastolic (congestive) heart failure: Secondary | ICD-10-CM | POA: Diagnosis not present

## 2016-09-25 DIAGNOSIS — D500 Iron deficiency anemia secondary to blood loss (chronic): Secondary | ICD-10-CM

## 2016-09-25 DIAGNOSIS — Z885 Allergy status to narcotic agent status: Secondary | ICD-10-CM

## 2016-09-25 DIAGNOSIS — E860 Dehydration: Secondary | ICD-10-CM

## 2016-09-25 DIAGNOSIS — Z79899 Other long term (current) drug therapy: Secondary | ICD-10-CM

## 2016-09-25 DIAGNOSIS — Z888 Allergy status to other drugs, medicaments and biological substances status: Secondary | ICD-10-CM

## 2016-09-25 LAB — BASIC METABOLIC PANEL
Anion gap: 9 (ref 5–15)
BUN: 10 mg/dL (ref 6–20)
CHLORIDE: 100 mmol/L — AB (ref 101–111)
CO2: 23 mmol/L (ref 22–32)
CREATININE: 0.55 mg/dL — AB (ref 0.61–1.24)
Calcium: 9.1 mg/dL (ref 8.9–10.3)
GFR calc Af Amer: >60 mL/min (ref >60)
GFR calc non Af Amer: >60 mL/min (ref >60)
GLUCOSE: 91 mg/dL (ref 65–99)
Potassium: 3.5 mmol/L (ref 3.5–5.1)
Sodium: 132 mmol/L — ABNORMAL LOW (ref 135–145)

## 2016-09-25 LAB — CBC
HCT: 25.7 % — ABNORMAL LOW (ref 39.0–52.0)
Hemoglobin: 7.9 g/dL — ABNORMAL LOW (ref 13.0–17.0)
MCH: 27.9 pg (ref 26.0–34.0)
MCHC: 30.7 g/dL (ref 30.0–36.0)
MCV: 90.8 fL (ref 78.0–100.0)
PLATELETS: 94 10*3/uL — AB (ref 150–400)
RBC: 2.83 MIL/uL — ABNORMAL LOW (ref 4.22–5.81)
RDW: 22.6 % — AB (ref 11.5–15.5)
WBC: 1.8 10*3/uL — ABNORMAL LOW (ref 4.0–10.5)

## 2016-09-25 LAB — PREPARE RBC (CROSSMATCH)

## 2016-09-25 LAB — GLUCOSE, CAPILLARY: Glucose-Capillary: 107 mg/dL — ABNORMAL HIGH (ref 65–99)

## 2016-09-25 MED ORDER — SIMETHICONE 80 MG PO CHEW
80.0000 mg | CHEWABLE_TABLET | Freq: Four times a day (QID) | ORAL | Status: DC | PRN
  Administered 2016-09-25: 80 mg via ORAL
  Filled 2016-09-25: qty 1

## 2016-09-25 NOTE — Progress Notes (Signed)
OT Cancellation Note  Patient Details Name: [name] MRN: [id number] DOB: [birth year]   Cancelled Treatment:     Pt. Is d/cing home today and patient and wife state that he is at baseline and does not need any equipment. Pt. And wife do not want any OT followup at home.   Eriana Suliman 09/25/2016, 10:11 AM

## 2016-09-25 NOTE — H&P (Signed)
Internal Medicine Attending Admission Note Date: 09/25/2016  Patient name: [name] Medical record number: [id number] Date of birth: March 18, 1945 Age: 71 y.o. Gender: male  I saw and evaluated the patient. I reviewed the resident's note and I agree with the resident's findings and plan as documented in the resident's note.  Chief Complaint(s): Shortness of breath and dizziness.  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 He was in his usual state of health until 5:00 in the morning when, while sitting on his couch, he suddenly felt short of breath. When he got up from the couch he felt dizzy and was afraid he was going to pass out. His wife helped him get back to the couch and he did not have a syncopal event. He denied any chest pain or palpitations during this episode, but had a feeling of warmth and generalized weakness. EMS was called and he was transported to the emergency department for further evaluation. He was admitted to the internal medicine teaching service. When found to be anemic below his hemoglobin target of 8.0 he was given a unit of packed red blood cells and felt improved. When seen on rounds the morning after admission he felt he was at baseline and did not want an additional unit of blood for hemoglobin of 7.9, nor additional Lasix at this time.  Physical Exam - key components related to admission:  Vitals:   09/24/16 1512 09/24/16 2018 09/25/16 0539 09/25/16 0916  BP: 122/66 125/69 115/69   Pulse: 73 66 73 71  Resp: 18 18 18    Temp: 97.6 F (36.4 C) 98.2 F (36.8 C) 98.2  F (36.8 C)   TempSrc: Oral Oral Oral   SpO2: 100% 100% 98%   Weight: 179 lb 1.6 oz (81.2 kg)  177 lb 4.8 oz (80.4 kg)   Height: 5' 9"  (1.753 m)      Gen.: Well-developed, well-nourished, mildly jaundiced man sitting comfortably in bed in no acute distress. Heart: Irregularly irregular with a 3/6 crescendo decrescendo systolic murmur best heard at the left upper sternal border.  Lab results:  Basic Metabolic Panel:  Recent Labs  09/24/16 0836 09/25/16 0325  NA 133* 132*  K 3.7 3.5  CL 100* 100*  CO2 27 23  GLUCOSE 95 91  BUN 10 10  CREATININE 0.53* 0.55*  CALCIUM 9.0 9.1   Liver Function Tests:  Recent Labs  09/24/16 0836  AST 46*  ALT 7*  ALKPHOS 168*  BILITOT 4.3*  PROT 5.6*  ALBUMIN 3.2*   CBC:  Recent Labs  09/24/16 0836 09/24/16 1657 09/25/16 0325  WBC 1.6*  --  1.8*  NEUTROABS 0.7*  --   --   HGB 7.6* 8.5* 7.9*  HCT 24.8* 27.6* 25.7*  MCV 91.9  --  90.8  PLT 137*  --  94*   Cardiac Enzymes:  Recent Labs  09/24/16 0836  TROPONINI <0.03   CBG:  Recent Labs  09/24/16 1609 09/24/16 2212 09/25/16 0745  GLUCAP 122* 98 107*   Urinalysis:  Clear, amber, specific every 1.014, pH 6.0, bilirubin small, nitrite negative, leukocytes negative.  Misc. Labs:  BNP 511  Imaging results:   Chest  x-ray: Personally reviewed. Poor inspiratory result, left costophrenic platelike atelectasis, no effusions or masses. Status post aortic valve replacement.  Other results:  EKG: Personally reviewed. Atrial flutter at 66 bpm, 1 PVC, normal intervals, Q waves in I and aVL, no LVH by voltage, poor R-wave progression, no ST changes, lateral T wave inversion in a strain pattern. The lateral T wave inversions are new compared to the previous ECG on 08/20/2016.  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 His shortness of breath improved with the blood transfusion. His dizziness also resolved. Unfortunately, we did not obtain the orthostatic vital signs as we had wished for and cannot say for sure if he was dehydrated. That being said, our working diagnosis is mild dehydration with increased insensible losses due to a lack of air conditioning in the setting of continued Lasix use. This is a temporary situation as he is having his air-conditioning fixed tomorrow.  1) Chronic blood loss anemia: He was given a packed red blood cell unit upon admission. The following morning his hemoglobin had fallen back to 7.9, but he felt at baseline and preferred not to have an additional unit. Despite the atrial flutter he is not a candidate for anticoagulation given his chronic GI blood loss and dependence on transfusions. He is scheduled to see his hematologist on Wednesday.  2) Dehydration: Likely secondary to increased insensible losses from a lack of air-conditioning in the setting of continued Lasix use. Clinically this has resolved with the blood transfusion. He is scheduled to see his primary care provider at the [place] on Tuesday.  3) Disposition: He will be discharged home today with follow-up in his primary care provider's office at the [facility] on Tuesday and his hematologist's office on Wednesday.

## 2016-09-25 NOTE — Progress Notes (Signed)
Pt got discharged to home, discharge instructions provided and patient showed understanding to it, IV taken out,Telemonitor DC,pt left unit in wheelchair with all of the belongings accompanied with wife 

## 2016-09-25 NOTE — Progress Notes (Signed)
Internal Medicine Attending  Date: 09/25/2016  Patient name: [name] Medical record number: [id number] Date of birth: 1945/09/07 Age: 71 y.o. Gender: male  I saw and evaluated the patient. I reviewed the resident's note by Dr. Ronalee Red and I agree with the resident's findings and plans as documented in her progress note.  Please see my H&P dated 09/25/2016 for the specifics of my evaluation, assessment, and plan from earlier in the day.

## 2016-09-25 NOTE — Discharge Instructions (Signed)
[name],  Please continue to take lasix 60mg  daily. We are not increasing this medicine at this visit. Please follow up with your primary doctor at the [facility] on Tuesday and your hematologist on Wednesday.

## 2016-09-25 NOTE — Evaluation (Signed)
Physical Therapy Evaluation Patient Details Name: [name] MRN: [id number] DOB: 04-11-1945 Today's Date: 09/25/2016   History of Present Illness  Pt is a 71 yo male admitted through ED on 09/24/16 with shortness of breath with ? CHF exacerbation and chronic anemia. PMH significant for Multiple myeloma on chemo, AVM, aortic stenosis, CHF, DM2, HTN, HLD, hyperthyroidism.   Clinical Impression  Pt presents with the above diagnosis for therapy evaluation. Prior to admission, pt was mod I for mobility and requires assistance for IADLs due to fatigue. Pt is able to perform bed mobs and transfers with Mod I and gait with supervision this session. Pt has multiple MD appointments weekly and does not want to pursue HHPT at this time. Pt is utilizing all techniques for energy conservation and safety including using RW and WC as needed. PT will sign off for now. Please re-order if any new needs should arise.     Follow Up Recommendations No PT follow up    Equipment Recommendations  None recommended by PT    Recommendations for Other Services       Precautions / Restrictions Precautions Precautions: Fall Restrictions Weight Bearing Restrictions: No      Mobility  Bed Mobility Overal bed mobility: Independent                Transfers Overall transfer level: Modified independent               General transfer comment: increased time, good hand placement. No hands on assistance  Ambulation/Gait Ambulation/Gait assistance: Supervision Ambulation Distance (Feet): 500 Feet Assistive device: None Gait Pattern/deviations: Step-through pattern;Decreased stride length Gait velocity: decreased Gait velocity interpretation: Below normal speed for age/gender General Gait Details: slow, steady gait with use of handrail or holding onto wife's hand for stability. No LOB or dyspnea noted  Stairs            Wheelchair Mobility    Modified Rankin (Stroke Patients Only)        Balance Overall balance assessment: Needs assistance Sitting-balance support: No upper extremity supported;Feet supported Sitting balance-Leahy Scale: Normal     Standing balance support: No upper extremity supported Standing balance-Leahy Scale: Fair                               Pertinent Vitals/Pain Pain Assessment: No/denies pain    Home Living Family/patient expects to be discharged to:: Private residence Living Arrangements: Spouse/significant other Available Help at Discharge: Family;Available 24 hours/day Type of Home: House Home Access: Level entry     Home Layout: One level Home Equipment: Walker - 2 wheels;Walker - 4 wheels;Bedside commode;Shower seat;Grab bars - toilet;Grab bars - tub/shower;Hand held shower head      Prior Function Level of Independence: Independent with assistive device(s)         Comments: used cane, RW or WC for mobility depending on energy.      Hand Dominance   Dominant Hand: Right    Extremity/Trunk Assessment   Upper Extremity Assessment Upper Extremity Assessment: Defer to OT evaluation    Lower Extremity Assessment Lower Extremity Assessment: Overall WFL for tasks assessed    Cervical / Trunk Assessment Cervical / Trunk Assessment: Normal  Communication   Communication: No difficulties  Cognition Arousal/Alertness: Awake/alert Behavior During Therapy: WFL for tasks assessed/performed Overall Cognitive Status: Within Functional Limits for tasks assessed  General Comments      Exercises     Assessment/Plan    PT Assessment Patent does not need any further PT services  PT Problem List         PT Treatment Interventions      PT Goals (Current goals can be found in the Care Plan section)  Acute Rehab PT Goals Patient Stated Goal: to get home today PT Goal Formulation: All assessment and education complete, DC therapy    Frequency      Barriers to discharge        Co-evaluation               AM-PAC PT "6 Clicks" Daily Activity  Outcome Measure Difficulty turning over in bed (including adjusting bedclothes, sheets and blankets)?: None Difficulty moving from lying on back to sitting on the side of the bed? : None Difficulty sitting down on and standing up from a chair with arms (e.g., wheelchair, bedside commode, etc,.)?: None Help needed moving to and from a bed to chair (including a wheelchair)?: None Help needed walking in hospital room?: A Little Help needed climbing 3-5 steps with a railing? : A Little 6 Click Score: 22    End of Session Equipment Utilized During Treatment: Gait belt Activity Tolerance: Patient tolerated treatment well Patient left: in bed;with call bell/phone within reach;with family/visitor present Nurse Communication: Mobility status PT Visit Diagnosis: Muscle weakness (generalized) (M62.81)    Time: 1610-9604 PT Time Calculation (min) (ACUTE ONLY): 21 min   Charges:   PT Evaluation $PT Eval Moderate Complexity: 1 Mod     PT G Codes:   PT G-Codes **NOT FOR INPATIENT CLASS** Functional Assessment Tool Used: AM-PAC 6 Clicks Basic Mobility;Clinical judgement Functional Limitation: Mobility: Walking and moving around Mobility: Walking and Moving Around Current Status ([id number]): At least 20 percent but less than 40 percent impaired, limited or restricted Mobility: Walking and Moving Around Goal Status [phone]): At least 20 percent but less than 40 percent impaired, limited or restricted Mobility: Walking and Moving Around Discharge Status [phone]): At least 20 percent but less than 40 percent impaired, limited or restricted    Scheryl Marten PT, DPT  [phone]   Shanon Rosser 09/25/2016, 10:11 AM

## 2016-09-25 NOTE — Progress Notes (Signed)
Subjective:  [name] is doing well this morning. No acute events overnight. Feels bloated/gassy, was given simethicone.  He denies further episodes of dizziness or shortness of breath. Feels that his weakness is improving. Hb 7.9 this morning, patient does not want another transfusion today because he is about to see his hematologist on Wednesday. He also requests that we do not give him lasix today. Amenable to discharge today with close follow up with his PCP on Tuesday and hematology on Wednesday.  Objective:  Vital signs in last 24 hours: Vitals:   09/24/16 1512 09/24/16 2018 09/25/16 0539 09/25/16 0916  BP: 122/66 125/69 115/69   Pulse: 73 66 73 71  Resp: 18 18 18    Temp: 97.6 F (36.4 C) 98.2 F (36.8 C) 98.2 F (36.8 C)   TempSrc: Oral Oral Oral   SpO2: 100% 100% 98%   Weight: 179 lb 1.6 oz (81.2 kg)  177 lb 4.8 oz (80.4 kg)   Height: 5\' 9"  (1.753 m)      GEN: Well-appearing. Sitting in bed comfortably in NAD. Alert and oriented. Appears slightly jaundiced. EYES: Sclera anicteric. RESP: Clear to auscultation bilaterally. No wheezes, rales, or rhonchi. CV: Irregular rate and rhythm. Holosystolic murmur, best heard at RUSB. 2+ LE edema EXT: 2+ LE edema.  Labs CBC Latest Ref Rng & Units 09/25/2016 09/24/2016 09/24/2016  WBC 4.0 - 10.5 K/uL 1.8(L) - 1.6(L)  Hemoglobin 13.0 - 17.0 g/dL 7.9(L) 8.5(L) 7.6(L)  Hematocrit 39.0 - 52.0 % 25.7(L) 27.6(L) 24.8(L)  Platelets 150 - 400 K/uL 94(L) - 137(L)   CMP Latest Ref Rng & Units 09/25/2016 09/24/2016 08/22/2016  Glucose 65 - 99 mg/dL 91 95 101(H)  BUN 6 - 20 mg/dL 10 10 11   Creatinine 0.61 - 1.24 mg/dL 0.55(L) 0.53(L) 0.50(L)  Sodium 135 - 145 mmol/L 132(L) 133(L) 131(L)  Potassium 3.5 - 5.1 mmol/L 3.5 3.7 4.0  Chloride 101 - 111 mmol/L 100(L) 100(L) 103  CO2 22 - 32 mmol/L 23 27 22   Calcium 8.9 - 10.3 mg/dL 9.1 9.0 9.6  Total Protein 6.5 - 8.1 g/dL - 5.6(L) -  Total Bilirubin 0.3 - 1.2 mg/dL - 4.3(H) -  Alkaline Phos 38  - 126 U/L - 168(H) -  AST 15 - 41 U/L - 46(H) -  ALT 17 - 63 U/L - 7(L) -   Assessment/Plan:  Active Problems:   Shortness of breath  [name] is a 71yo male with PMH significant for MM (diagnosed July 2017, on active chemotherapy), AVMs (requiring frequent blood transfusions), aortic stenosis s/p porcine AVR 2006 & re-do bioprosthetic AVR 2013 (not on anticoagulation due to chronic GI bleed), HFpEF, DM2, HTN, HLD, and hyperthyroidism who presents with acute dyspnea and dizziness one day after blood transfusion for Hb 7.3, now resolved s/p 1u pRBC.  # Shortness of breath and weakness, now resolved, likely secondary to anemia and dehydration Sats have remained above 95% on RA in the hospital. Denies current dyspnea. Patient received 1u of pRBC. Post-transfusion Hb was 8.5. This morning it was 7.9. Patient declines another unit of blood because he is about to see his hematologist on Wednesday and feels he does not need it. He also declines his lasix this morning. Our working diagnosis is that his acute dyspnea and weakness were related to likely being dehydrated in a warmer house with a non-functioning A/C unit on lasix and in the setting of chronic anemia. Patient states that his A/C is being fixed tomorrow, so hopefully this will not  recur. We have decided not to change his lasix dose because he weighed 2 more pounds on admission than he did when he was last discharged ~30 days ago.  - Will not give transfusion per patient request - Patient to f/u with hematologist on Wednesday - Will hold lasix per patient request - Patient to f/u with PCP on Tuesday - goal Hb >8 per hematology  # Dizziness, resolved Denies dizziness. Possibly related to dehydration 2/2 broken AC in setting of lasix 60mg  daily, s/p 1u transfusion. - Orthostatics  # Atrial flutter, rate controlled CHADS2-VASc score is at least 4 with a 4.8% yearly risk of stroke. He is not currently a candidate for anticoagulation due  to his chronic GI bleed 2/2 AVM. - continue home digoxin 0.125mg  daily - telemetry  # HFpEF BNP 511, CXR without pulmonary edema - discharge home on lasix 60mg  daily - daily weights - strict I/Os  # DM2 - hold home metformin - SSI - CBG monitoring  # HLD - continue home simvastatin  # FEN/GI - Diabetic diet - continue home pantoprazole 40mg  daily  VTE PPx: SCDs Code status: Full  Dispo: Discharge home today  Colbert Ewing, MD  Internal Medicine, PGY-1 09/25/2016, 9:43 AM Pager: Mamie Nick [phone]

## 2016-09-26 LAB — TYPE AND SCREEN
ABO/RH(D): A NEG
Antibody Screen: NEGATIVE
UNIT DIVISION: 0
Unit division: 0
Unit division: 0

## 2016-09-26 LAB — BPAM RBC
BLOOD PRODUCT EXPIRATION DATE: 201809112359
Blood Product Expiration Date: 201808242359
Blood Product Expiration Date: 201809102359
ISSUE DATE / TIME: 201808181209
UNIT TYPE AND RH: 600
UNIT TYPE AND RH: 600
Unit Type and Rh: 9500

## 2016-10-02 ENCOUNTER — Encounter (HOSPITAL_COMMUNITY): Payer: Self-pay | Admitting: Emergency Medicine

## 2016-10-02 ENCOUNTER — Emergency Department (HOSPITAL_COMMUNITY): Payer: Medicare Other

## 2016-10-02 ENCOUNTER — Inpatient Hospital Stay (HOSPITAL_COMMUNITY)
Admission: EM | Admit: 2016-10-02 | Discharge: 2016-10-04 | DRG: 292 | Disposition: A | Discharge status: Home / Self Care | Payer: Medicare Other | Attending: Internal Medicine | Admitting: Internal Medicine

## 2016-10-02 DIAGNOSIS — C9000 Multiple myeloma not having achieved remission: Secondary | ICD-10-CM | POA: Diagnosis present

## 2016-10-02 DIAGNOSIS — R0602 Shortness of breath: Secondary | ICD-10-CM

## 2016-10-02 DIAGNOSIS — G2581 Restless legs syndrome: Secondary | ICD-10-CM | POA: Diagnosis present

## 2016-10-02 DIAGNOSIS — K219 Gastro-esophageal reflux disease without esophagitis: Secondary | ICD-10-CM | POA: Diagnosis not present

## 2016-10-02 DIAGNOSIS — I4892 Unspecified atrial flutter: Secondary | ICD-10-CM | POA: Diagnosis not present

## 2016-10-02 DIAGNOSIS — R680 Hypothermia, not associated with low environmental temperature: Secondary | ICD-10-CM | POA: Diagnosis present

## 2016-10-02 DIAGNOSIS — J309 Allergic rhinitis, unspecified: Secondary | ICD-10-CM | POA: Diagnosis present

## 2016-10-02 DIAGNOSIS — I5082 Biventricular heart failure: Secondary | ICD-10-CM | POA: Diagnosis present

## 2016-10-02 DIAGNOSIS — I110 Hypertensive heart disease with heart failure: Principal | ICD-10-CM | POA: Diagnosis present

## 2016-10-02 DIAGNOSIS — F419 Anxiety disorder, unspecified: Secondary | ICD-10-CM | POA: Diagnosis present

## 2016-10-02 DIAGNOSIS — R0682 Tachypnea, not elsewhere classified: Secondary | ICD-10-CM | POA: Diagnosis not present

## 2016-10-02 DIAGNOSIS — E0590 Thyrotoxicosis, unspecified without thyrotoxic crisis or storm: Secondary | ICD-10-CM | POA: Diagnosis present

## 2016-10-02 DIAGNOSIS — M109 Gout, unspecified: Secondary | ICD-10-CM | POA: Diagnosis present

## 2016-10-02 DIAGNOSIS — Z79891 Long term (current) use of opiate analgesic: Secondary | ICD-10-CM

## 2016-10-02 DIAGNOSIS — Z888 Allergy status to other drugs, medicaments and biological substances status: Secondary | ICD-10-CM

## 2016-10-02 DIAGNOSIS — Z79899 Other long term (current) drug therapy: Secondary | ICD-10-CM

## 2016-10-02 DIAGNOSIS — K921 Melena: Secondary | ICD-10-CM | POA: Diagnosis present

## 2016-10-02 DIAGNOSIS — E119 Type 2 diabetes mellitus without complications: Secondary | ICD-10-CM | POA: Diagnosis present

## 2016-10-02 DIAGNOSIS — R140 Abdominal distension (gaseous): Secondary | ICD-10-CM | POA: Diagnosis not present

## 2016-10-02 DIAGNOSIS — K589 Irritable bowel syndrome without diarrhea: Secondary | ICD-10-CM | POA: Diagnosis present

## 2016-10-02 DIAGNOSIS — G4733 Obstructive sleep apnea (adult) (pediatric): Secondary | ICD-10-CM | POA: Diagnosis present

## 2016-10-02 DIAGNOSIS — Z9221 Personal history of antineoplastic chemotherapy: Secondary | ICD-10-CM

## 2016-10-02 DIAGNOSIS — Z953 Presence of xenogenic heart valve: Secondary | ICD-10-CM

## 2016-10-02 DIAGNOSIS — T501X5A Adverse effect of loop [high-ceiling] diuretics, initial encounter: Secondary | ICD-10-CM | POA: Diagnosis present

## 2016-10-02 DIAGNOSIS — K449 Diaphragmatic hernia without obstruction or gangrene: Secondary | ICD-10-CM | POA: Diagnosis present

## 2016-10-02 DIAGNOSIS — I700 Atherosclerosis of aorta: Secondary | ICD-10-CM | POA: Diagnosis present

## 2016-10-02 DIAGNOSIS — D61818 Other pancytopenia: Secondary | ICD-10-CM | POA: Diagnosis present

## 2016-10-02 DIAGNOSIS — Z87891 Personal history of nicotine dependence: Secondary | ICD-10-CM | POA: Diagnosis not present

## 2016-10-02 DIAGNOSIS — N400 Enlarged prostate without lower urinary tract symptoms: Secondary | ICD-10-CM | POA: Diagnosis not present

## 2016-10-02 DIAGNOSIS — Z8249 Family history of ischemic heart disease and other diseases of the circulatory system: Secondary | ICD-10-CM

## 2016-10-02 DIAGNOSIS — N3010 Interstitial cystitis (chronic) without hematuria: Secondary | ICD-10-CM | POA: Diagnosis present

## 2016-10-02 DIAGNOSIS — I2510 Atherosclerotic heart disease of native coronary artery without angina pectoris: Secondary | ICD-10-CM | POA: Diagnosis present

## 2016-10-02 DIAGNOSIS — Z7984 Long term (current) use of oral hypoglycemic drugs: Secondary | ICD-10-CM

## 2016-10-02 DIAGNOSIS — I5033 Acute on chronic diastolic (congestive) heart failure: Secondary | ICD-10-CM | POA: Diagnosis not present

## 2016-10-02 DIAGNOSIS — D630 Anemia in neoplastic disease: Secondary | ICD-10-CM | POA: Diagnosis present

## 2016-10-02 DIAGNOSIS — I350 Nonrheumatic aortic (valve) stenosis: Secondary | ICD-10-CM | POA: Diagnosis present

## 2016-10-02 DIAGNOSIS — K5900 Constipation, unspecified: Secondary | ICD-10-CM | POA: Diagnosis not present

## 2016-10-02 DIAGNOSIS — Q2730 Arteriovenous malformation, site unspecified: Secondary | ICD-10-CM

## 2016-10-02 DIAGNOSIS — D500 Iron deficiency anemia secondary to blood loss (chronic): Secondary | ICD-10-CM

## 2016-10-02 DIAGNOSIS — E871 Hypo-osmolality and hyponatremia: Secondary | ICD-10-CM | POA: Diagnosis present

## 2016-10-02 DIAGNOSIS — E785 Hyperlipidemia, unspecified: Secondary | ICD-10-CM | POA: Diagnosis not present

## 2016-10-02 DIAGNOSIS — R188 Other ascites: Secondary | ICD-10-CM | POA: Diagnosis present

## 2016-10-02 DIAGNOSIS — Z885 Allergy status to narcotic agent status: Secondary | ICD-10-CM

## 2016-10-02 DIAGNOSIS — J811 Chronic pulmonary edema: Secondary | ICD-10-CM | POA: Diagnosis present

## 2016-10-02 LAB — I-STAT CHEM 8, ED
BUN: 10 mg/dL (ref 6–20)
CALCIUM ION: 1.25 mmol/L (ref 1.15–1.40)
CHLORIDE: 102 mmol/L (ref 101–111)
Creatinine, Ser: 0.4 mg/dL — ABNORMAL LOW (ref 0.61–1.24)
Glucose, Bld: 97 mg/dL (ref 65–99)
HCT: 22 % — ABNORMAL LOW (ref 39.0–52.0)
HEMOGLOBIN: 7.5 g/dL — AB (ref 13.0–17.0)
POTASSIUM: 3.7 mmol/L (ref 3.5–5.1)
SODIUM: 136 mmol/L (ref 135–145)
TCO2: 22 mmol/L (ref 22–32)

## 2016-10-02 LAB — I-STAT ARTERIAL BLOOD GAS, ED
Acid-Base Excess: 1 mmol/L (ref 0.0–2.0)
Bicarbonate: 24.7 mmol/L (ref 20.0–28.0)
O2 SAT: 100 %
PCO2 ART: 35.1 mmHg (ref 32.0–48.0)
PH ART: 7.452 — AB (ref 7.350–7.450)
TCO2: 26 mmol/L (ref 22–32)
pO2, Arterial: 167 mmHg — ABNORMAL HIGH (ref 83.0–108.0)

## 2016-10-02 LAB — CBC WITH DIFFERENTIAL/PLATELET
BASOS PCT: 1 %
Basophils Absolute: 0 10*3/uL (ref 0.0–0.1)
EOS PCT: 1 %
Eosinophils Absolute: 0 10*3/uL (ref 0.0–0.7)
HEMATOCRIT: 23.8 % — AB (ref 39.0–52.0)
Hemoglobin: 7.3 g/dL — ABNORMAL LOW (ref 13.0–17.0)
LYMPHS PCT: 22 %
Lymphs Abs: 0.5 10*3/uL — ABNORMAL LOW (ref 0.7–4.0)
MCH: 29.2 pg (ref 26.0–34.0)
MCHC: 30.7 g/dL (ref 30.0–36.0)
MCV: 95.2 fL (ref 78.0–100.0)
MONOS PCT: 22 %
Monocytes Absolute: 0.5 10*3/uL (ref 0.1–1.0)
NEUTROS ABS: 1.1 10*3/uL — AB (ref 1.7–7.7)
Neutrophils Relative %: 54 %
Platelets: 103 10*3/uL — ABNORMAL LOW (ref 150–400)
RBC: 2.5 MIL/uL — ABNORMAL LOW (ref 4.22–5.81)
RDW: 24.7 % — AB (ref 11.5–15.5)
WBC: 2.1 10*3/uL — ABNORMAL LOW (ref 4.0–10.5)

## 2016-10-02 LAB — COMPREHENSIVE METABOLIC PANEL
ALT: <5 U/L — ABNORMAL LOW (ref 17–63)
ANION GAP: 6 (ref 5–15)
AST: 64 U/L — ABNORMAL HIGH (ref 15–41)
Albumin: 3.1 g/dL — ABNORMAL LOW (ref 3.5–5.0)
Alkaline Phosphatase: 146 U/L — ABNORMAL HIGH (ref 38–126)
BILIRUBIN TOTAL: 3.3 mg/dL — AB (ref 0.3–1.2)
BUN: 9 mg/dL (ref 6–20)
CO2: 23 mmol/L (ref 22–32)
Calcium: 9.4 mg/dL (ref 8.9–10.3)
Chloride: 104 mmol/L (ref 101–111)
Creatinine, Ser: 0.48 mg/dL — ABNORMAL LOW (ref 0.61–1.24)
GFR calc Af Amer: >60 mL/min (ref >60)
Glucose, Bld: 99 mg/dL (ref 65–99)
POTASSIUM: 3.6 mmol/L (ref 3.5–5.1)
Sodium: 133 mmol/L — ABNORMAL LOW (ref 135–145)
TOTAL PROTEIN: 5.5 g/dL — AB (ref 6.5–8.1)

## 2016-10-02 LAB — I-STAT CG4 LACTIC ACID, ED: LACTIC ACID, VENOUS: 1.25 mmol/L (ref 0.5–1.9)

## 2016-10-02 LAB — DIGOXIN LEVEL: Digoxin Level: 0.3 ng/mL — ABNORMAL LOW (ref 0.8–2.0)

## 2016-10-02 LAB — I-STAT TROPONIN, ED: Troponin i, poc: 0.02 ng/mL (ref 0.00–0.08)

## 2016-10-02 LAB — PROTIME-INR
INR: 1.14
Prothrombin Time: 14.7 seconds (ref 11.4–15.2)

## 2016-10-02 LAB — CBG MONITORING, ED: GLUCOSE-CAPILLARY: 101 mg/dL — AB (ref 65–99)

## 2016-10-02 LAB — BRAIN NATRIURETIC PEPTIDE: B NATRIURETIC PEPTIDE 5: 416.3 pg/mL — AB (ref 0.0–100.0)

## 2016-10-02 MED ORDER — FUROSEMIDE 10 MG/ML IJ SOLN
60.0000 mg | Freq: Once | INTRAMUSCULAR | Status: AC
  Administered 2016-10-02: 60 mg via INTRAVENOUS
  Filled 2016-10-02: qty 6

## 2016-10-02 MED ORDER — IOPAMIDOL (ISOVUE-370) INJECTION 76%
INTRAVENOUS | Status: AC
  Filled 2016-10-02: qty 100

## 2016-10-02 MED ORDER — SODIUM CHLORIDE 0.9 % IV SOLN
Freq: Once | INTRAVENOUS | Status: AC
  Administered 2016-10-03: 03:00:00 via INTRAVENOUS

## 2016-10-02 MED ORDER — IOPAMIDOL (ISOVUE-370) INJECTION 76%
INTRAVENOUS | Status: AC
  Administered 2016-10-02: 100 mL via INTRAVENOUS
  Filled 2016-10-02: qty 100

## 2016-10-02 NOTE — ED Notes (Signed)
Pt given oral swab for mouth.

## 2016-10-02 NOTE — ED Notes (Signed)
Pt refused to use urinal.  Pt on bedside commode and requested RN to go out.  Wife at bedside.  Pt instructed to call out when finished for assistance going back to bed.

## 2016-10-02 NOTE — ED Notes (Signed)
Delay in lab draw,  Pt using bedside commode. 

## 2016-10-02 NOTE — ED Notes (Addendum)
Dr. Regenia Skeeter at bedside to talk with wife.  States to change pt to [place].  O2 sats 100%.  Pt states he doesn't like to have a [place] on his neck or behind his head.  Dr. Regenia Skeeter notified and will try pt on room air.

## 2016-10-02 NOTE — ED Notes (Addendum)
Pt c/o chest pain when on room air.  O2 sats remained at 100%.  [place] applied at 2L and pt requested [place] to be on chin instead of neck.  Dr. Regenia Skeeter notified of same.

## 2016-10-02 NOTE — H&P (Signed)
Date: 10/02/2016               Patient Name:  [name] MRN: [id number]  DOB: 11-15-1945 Age / Sex: 71 y.o., male   PCP: Buzzy Han, MD         Medical Service: Internal Medicine Teaching Service         Attending Physician: Dr. Oval Linsey, MD    First Contact: Dr. Ronalee Red Pager: [phone]  Second Contact: Dr. Charlynn Grimes Pager: [phone]       After Hours (After 5p/  First Contact Pager: [phone]  weekends / holidays): Second Contact Pager: [phone]   Chief Complaint: Shortness of breath   History of Present Illness: [name] is a 71 year old male with a past medical history significant for diabetes, OSA, hyperlipidemia, GERD, BPH, bioprosthetic aortic valve, multiple myeloma, atrial flutter, diastolic heart failure, and chronic GI bleed (likely AVMs) requiring weekly blood transfusions presenting with shortness of breath. Patient was recently discharged one week ago after hospitalization in the setting of anemia requiring transfusion and dehydration. Patient received 1 unit of packed red blood cells and was discharged with plans for oncology follow-up at [facility]. Of note, patient reports his last transfusion was Friday 09/30/16 after discharge and he received 1 unit pRBCs for hemoglobin of 7.8. He continues to have chronic dark melanotic stools. He is scheduled for follow up PET scan tomorrow at [facility]. He normally takes Lasix 20 mg TID for his dCHF, but reports he did not take his lasix today due to concern for frequent urination during the scan tomorrow. Around 6 pm today, patient developed sudden onset shortness of breath. Over the next few hours, his shortness of breath progressed and he became more and more dyspneic. Wife reports going to help him stand when he suddenly became very weak and almost fell to the ground. She then called EMS to bring him in for evaluation.  On arrival to the ED, patient was hypothermic temp 96 (rectal) but hemodynamically stable  with BP 124/89, HR 53, tachypneic with RR 23, and satting 100% on 2.5 L nasal cannula. Chest x-ray was significant for left basilar atelectasis. Follow-up CTA chest revealed increased pulmonary edema, right heart failure with contrast refluxing into the hepatic veins and IVC, a small right pleural effusion, and small-volume upper abdominal ascites that is new from prior imaging; negative for pulmonary embolus. BNP was elevated at 416, but down from 511 9 days ago.  I-stat ABG with pH 7.45, pCO2 35, PO2 of 167. CMP was notable for mild chronic hyponatremia of 133, creatinine 0.48 at baseline, AST mildly elevated at 64, ALT < 5, alkaline phosphatase elevated at 146, and Tbili elevated 3.3. CBC with pancytopenia: WBC 2.1, hemoglobin 7.3 (down from 7.9 one week ago), and platelets 103. coags were within normal limits. Digoxin level was low at 0.3.  Meds:  Current Meds  Medication Sig  . acetaminophen (TYLENOL) 325 MG tablet Take 650 mg by mouth every 6 (six) hours as needed for fever.   . carbidopa-levodopa (SINEMET IR) 25-100 MG tablet Take 3 tablets by mouth 4 (four) times daily.   . cyanocobalamin 500 MCG tablet Take 500 mcg by mouth 2 (two) times daily. Lunch/dinner  . cyclobenzaprine (FLEXERIL) 10 MG tablet Take 5 mg by mouth 3 (three) times daily as needed for muscle spasms.  Marland Kitchen dexamethasone (DECADRON) 4 MG tablet Take 10 mg by mouth every 7 (seven) days. days 1, 8, 15 and 22 of 28 day  cycle.  . diazepam (VALIUM) 2 MG tablet Take 2 mg by mouth at bedtime as needed for anxiety.   . digoxin (LANOXIN) 0.125 MG tablet Take 1 tablet (0.125 mg total) by mouth daily.  Marland Kitchen docusate sodium (COLACE) 100 MG capsule Take 100 mg by mouth 2 (two) times daily as needed for moderate constipation.   . finasteride (PROSCAR) 5 MG tablet Take 5 mg by mouth daily.  . fish oil-omega-3 fatty acids 1000 MG capsule Take 1 g by mouth daily with lunch.   . folic acid (FOLVITE) 1 MG tablet Take 1 mg by mouth daily.  .  furosemide (LASIX) 40 MG tablet Take 1.5 tablets (60 mg total) by mouth daily.  Marland Kitchen gabapentin (NEURONTIN) 800 MG tablet Take 400-800 mg by mouth See admin instructions. Take 1/2 tablet every morning and at bedtime then take 1 tablet at lunch and dinner  . hydrocortisone (ANUSOL-HC) 2.5 % rectal cream Place 1 application rectally 2 (two) times daily.  . metFORMIN (GLUCOPHAGE) 500 MG tablet Take 500 mg by mouth 2 (two) times daily with a meal.  . methylcellulose (ARTIFICIAL TEARS) 1 % ophthalmic solution Place 1 drop into both eyes at bedtime as needed (dry eyes).  . pantoprazole (PROTONIX) 40 MG tablet Take 1 tablet (40 mg total) by mouth daily. For GERD (Patient taking differently: Take 40 mg by mouth daily with lunch. For GERD)  . Polyvinyl Alcohol-Povidone PF (REFRESH) 1.4-0.6 % SOLN Place 1 drop into both eyes 2 (two) times daily as needed (dry eyes).  . pomalidomide (POMALYST) 4 MG capsule Take 4 mg by mouth See admin instructions. Take with water on days 1-21 then  PATIENT IS OFF MED FOR 7 DAYS, Repeat every 28 days.  . simvastatin (ZOCOR) 40 MG tablet Take 20 mg by mouth at bedtime.   . traMADol (ULTRAM) 50 MG tablet Take 50 mg by mouth every 6 (six) hours as needed for moderate pain.  Marland Kitchen UNABLE TO FIND Place 1 application into the right eye daily. Ocular Ointment   . zinc oxide 20 % ointment Apply 1 application topically 2 (two) times daily as needed for irritation.    Allergies: Allergies as of 10/02/2016 - Review Complete 10/02/2016  Allergen Reaction Noted  . Avodart [dutasteride] Other (See Comments) 01/31/2015  . Spironolactone Other (See Comments) 01/31/2015  . Codeine Other (See Comments)   . Diazepam Other (See Comments) 01/31/2015  . Doxazosin Other (See Comments) 01/31/2015  . Feraheme [ferumoxytol] Swelling 11/15/2011   Past Medical History:  Diagnosis Date  . Allergic rhinitis   . Anemia 2009  . Aortic stenosis    a. s/p porcine AVR 2006;  b. s/p redo tissue AVR 12/2011  (Dr. Roxy Manns) - preAVR LHC with no CAD  . Asthma   . BPH (benign prostatic hypertrophy)   . Cancer ([place])    multiple myeloma  . Cataract   . Chronic cough   . Chronic interstitial cystitis   . Depression    pt denies  . Diabetes mellitus    type 2  . Diabetes mellitus without complication ([place])   . Diverticulosis of colon    on colonoscopy 2008  . GERD (gastroesophageal reflux disease)    bravo pH study 2008  . Gout   . H/O aortic valve replacement with porcine valve    2006, 2013  . Headache(784.0)   . Hemorrhoids    external and internal  . Hiatal hernia   . HTN (hypertension)   . Hx of echocardiogram  a. Echo  (post AVR) 12/2011:  mod LVH, EF 60-65%, Gr 2 diast dysfn, mild AI, AVR ok (mean gradient 19 mmHg), MAC, mild BAE  . Hyperlipidemia   . Hypertension   . Hyperthyroidism   . IBS (irritable bowel syndrome)   . Insomnia   . Lower GI bleed 07/16/2015  . OA (osteoarthritis)   . OSA (obstructive sleep apnea)    USES CPAP AS NEEDED  . 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 Periodontitis    chronic with bone loss  . Restless leg syndrome   . S/P aortic valve replacement with bioprosthetic valve 12/06/2011   Redo AVR using 23 mm Edwards Magna Ease pericardial tissue valve   Family History: Dad with MI (age 60), mom with severe osteoarthritis, both deceased.   Social History: Quit smoking 4-5 years ago. Previously smoked 1.5 PPD for 6 years. Denies alcohol and illicit drug.   Review of Systems: A complete ROS was negative except as per HPI.   Physical Exam: Blood pressure (!) 112/58, pulse (!) 54, temperature (!) 96 F (35.6 C), temperature source Rectal, resp. rate 16, SpO2 94 %. Constitutional: Pale, NAD HEENT: Conjunctival rim pallor  Cardiovascular: Bradycardic but regular  Pulmonary/Chest: Bibasilar crackles, dyspneic with conversation on 2.5L [place] Abdominal: Soft, non tender, non distended. +BS.  Extremities: Cool to touch, 2+ pitting edema  to mid shins bilaterally   Neurological: A&Ox3, CN II - XII grossly intact.  Skin: No rashes or erythema  Psychiatric: Normal mood and affect  10/03/16 EKG: Personally reviewed; Atrial flutter with 4:1 block, ventricular rate of 53; unchanged from prior tracing, no new ischemic changes.   10/03/16 CXR: Personally reviewed; low lung volumes, mild interstitial markings overall unchanged from prior film one week ago.  10/03/16 CTA Chest:  IMPRESSION: 1. No pulmonary embolus. 2. Worsening heart failure with increased pulmonary edema. Right heart failure with contrast refluxing into the hepatic veins and IVC. Small right pleural effusion. Small volume upper abdominal ascites is new from prior. 3. Coronary artery calcifications and mild aortic atherosclerosis. Multi chamber cardiomegaly.  Assessment & Plan by Problem:  Patient is a 71 year old gentleman with a past medical history of multiple myeloma, diastolic heart failure, and chronic lower GI bleed presenting with progressive shortness of breath and lower extremity edema admitted for management of dCHF exacerbation and symptomatic anemia.   Acute on Chronic HFpEF: Secondary to lasix non adherence x 1 day. Cardiac history is significant for bioprosthetic aortic valve replacement. Last echocardiogram 08/21/16 with EF 65-70%, moderate prosthetic valve stenosis with mild perivalvular regurgitation, and mildly elevated PA pressure of 41 (no mention of diastolic function). Patient is presenting with worsening LE edema and shortness of breath. CTA chest revealed worsening pulmonary edema and right sided heart failure evidenced by contrast refluxing into the hepatic veins and IVC as well as small right pleural effusion and small volume upper abdominal ascites new from prior imaging. S/p IV lasix 60 mg in ED. Respiratory status stable on 2.5L [place].  -- Continue IV lasix 40 mg BID tomorrow -- Fluid restrict 1.5L  -- Strict I/Os -- Daily weights  --  Telemetry  -- Pulse ox -- Supplemental oxygen prn to maintain saturation > 92%   Symptomatic Anemia: Secondary to multiple myeloma and chronic GI bleed requiring frequent transfusion. Patient continues to endorse chronic melanotic stool. Last transfused Friday 8/24 for Hgb 7.8. Today, hgb is down to 7.3. Respiratory distress is likely multifactorial due to symptomatic anemia and CHF exacerbation.  --  Transfuse 1 unit pRBC now -- Follow up post transfusion H&H -- AM CBC -- Monitor for signs and sxs of anemia   Multiple Myeloma: With pancytopenia. Follows with oncology at [facility], scheduled for PET scan tomorrow at noon.  -- Follow up [place] home tramadol prn   Diabetes: Hold home metformin -- SSI-sensitive TID w/ meals  Chronic Atrial Flutter: With 4:1 block, rate controlled on home digoxin although levels are subtherapeutic. CHADS2-VASc score of at least 4 however not a candidate for anticoagulation due to chronic GI bleed.  -- Continue home digoxin 0.125 mg daily for now -- Follow up repeat AM digoxin level  -- Telemetry   GERD: -- Continue home protonix 40 mg daily  Anxiety:  -- Continue home valium prn   HLD:  -- Continue home simvastatin 40 mg QHS -- Continue home fish oil supplements   BPH:  -- Continue home finasteride 5 mg daily   Constipation: -- Continue home docusate BID prn   Other: -- Continue home Carbidopa-levodopa   FEN: Fluid restrict 1.5L, replete lytes prn, heart healthy/carb mod VTE ppx: SCDs Code Status: FULL   Dispo: Admit patient to Observation with expected length of stay less than 2 midnights.  SignedVelna Ochs, MD 10/02/2016, 11:19 PM  Pager: [phone]

## 2016-10-02 NOTE — ED Triage Notes (Signed)
Report from [facility] EMS called out for sob.  Pt sitting on couch, sob, labored breathing with accessory muscle use.  C/o epigastric pain.  Had dark stool today- history of same.  Received 1 unit of blood earlier this week for Hgb 7.4.  Supposed to go to [facility] tomorrow to find out if he has CHF.  2+ pedal edema to lower extremities.  Pt has stage 4 multiple myeloma.  Had ? Seizure with EMS with episode of "staring" for 20 seconds and then postictal.  A flutter on monitor with episode of bradycardia at 39 pta.  Pt alert and oriented on arrival to ED.  O2 sats 100% on NRB.  Dr. Regenia Skeeter and RT at bedside.  Lung sounds clear.  R lower lung sounds diminished.  Denies pain at present.

## 2016-10-02 NOTE — ED Notes (Signed)
Pt given a few ice chips.  Aware of need for urine sample.

## 2016-10-02 NOTE — ED Notes (Signed)
Warm blankets placed on pt and room temperature increased.  Dr. Regenia Skeeter notified of temp.

## 2016-10-02 NOTE — ED Provider Notes (Signed)
_ [place] DEPT Provider Note   CSN: [id number] Arrival date & time: 10/02/16  2001     History   Chief Complaint Chief Complaint  Patient presents with  . Shortness of Breath    HPI [name] is a 71 y.o. male.  HPI  71 year old male with a history of aortic stenosis status post porcine valve, multiple myeloma, chronic GI bleed with frequent blood transfusions, heart failure, diabetes presents with acute shortness of breath. EMS reports that he has had increased work of breathing, lethargy, and decreased right-sided breath sounds. Patient states that he all of a sudden started feeling shortness of breath. He is awake and alert for me. His wife confirms that about 2 hours prior to arrival he all of a sudden had to sit down because he was short of breath. He took only 20 mg of Lasix instead of 60 yesterday and 0 today because he is due for a PET scan tomorrow. He last received a blood transfusion 2 days ago. His legs have been increasing in size. He does have some intermittent chest pain. Placed on Oxygen by EMS but no reports of hypoxia. Wife noted patient seemed unsteady like he was going to pass out but he did not.  Past Medical History:  Diagnosis Date  . Allergic rhinitis   . Anemia 2009  . Aortic stenosis    a. s/p porcine AVR 2006;  b. s/p redo tissue AVR 12/2011 (Dr. Roxy Manns) - preAVR LHC with no CAD  . Asthma   . BPH (benign prostatic hypertrophy)   . Cancer ([place])    multiple myeloma  . Cataract   . Chronic cough   . Chronic interstitial cystitis   . Depression    pt denies  . Diabetes mellitus    type 2  . Diabetes mellitus without complication ([place])   . Diverticulosis of colon    on colonoscopy 2008  . GERD (gastroesophageal reflux disease)    bravo pH study 2008  . Gout   . H/O aortic valve replacement with porcine valve    2006, 2013  . Headache(784.0)   . Hemorrhoids    external and internal  . Hiatal hernia   . HTN (hypertension)   . Hx of  echocardiogram    a. Echo  (post AVR) 12/2011:  mod LVH, EF 60-65%, Gr 2 diast dysfn, mild AI, AVR ok (mean gradient 19 mmHg), MAC, mild BAE  . Hyperlipidemia   . Hypertension   . Hyperthyroidism   . IBS (irritable bowel syndrome)   . Insomnia   . Lower GI bleed 07/16/2015  . OA (osteoarthritis)   . OSA (obstructive sleep apnea)    USES CPAP AS NEEDED  . 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 Periodontitis    chronic with bone loss  . Restless leg syndrome   . S/P aortic valve replacement with bioprosthetic valve 12/06/2011   Redo AVR using 23 mm [facility] Ease pericardial tissue valve    Patient Active Problem List   Diagnosis Date Noted  . Dehydration   . Shortness of breath 09/24/2016  . Dyspnea 08/20/2016  . Pancytopenia ([place]) 08/20/2016  . Type II diabetes mellitus with complication ([place]) 65/53/7482  . Hypoxia 08/20/2016  . CHF (congestive heart failure) ([place]) 08/20/2016  . Sepsis, unspecified organism ([place]) 03/23/2016  . Acute on chronic diastolic congestive heart failure ([place]) 03/23/2016  . Influenza A 03/23/2016  . Acute respiratory failure ([place]) 03/23/2016  . Paravalvular leak of  prosthetic heart valve 10/27/2015  . Rectal bleeding   . Internal bleeding hemorrhoids   . Symptomatic anemia 07/16/2015  . GI bleed 07/16/2015  . Hemorrhoids 07/16/2015  . Multiple myeloma not having achieved remission ([place])   . Syncope and collapse   . Acute blood loss anemia 03/17/2015  . Bruit 01/19/2015  . S/P aortic valve replacement with bioprosthetic valve 12/06/2011  . Aortic stenosis 11/30/2011  . Chronic daily headache 11/30/2011  . Iron deficiency anemia, unspecified 09/15/2011  . IBS (irritable bowel syndrome) 09/15/2011  . Syncope 08/29/2011  . Cough syncope 10/13/2010  . HYPERLIPIDEMIA-MIXED 07/02/2008  . Essential hypertension 07/02/2008  . GERD 07/02/2008  . OBSTRUCTIVE SLEEP APNEA 01/15/2007  . ALLERGIC  RHINITIS 01/15/2007  . INSOMNIA  01/15/2007  . OSTEOARTHRITIS 12/02/2006  . BPH (benign prostatic hyperplasia) 12/02/2006  . H/O aortic valve replacement 12/02/2006    Past Surgical History:  Procedure Laterality Date  . AORTA - FEMORAL ARTERY BYPASS GRAFT    . AORTIC VALVE REPLACEMENT  10/13/2004   72m [place] pericardial tissue valve  . AORTIC VALVE REPLACEMENT  12/06/2011   Procedure: REDO AORTIC VALVE REPLACEMENT (AVR);  Surgeon: CRexene Alberts MD;  [place] [facility]  Service: Open Heart Surgery;  Laterality: N/A;  . BUNIONECTOMY     right  . CARDIAC SURGERY     aorta vavle replacement  . CATARACT EXTRACTION  2009    &   2012   BILATERAL  . COLONOSCOPY  08/31/2011   Procedure: COLONOSCOPY;  Surgeon: RInda Castle MD;  [place] [facility]  Service: Endoscopy;  Laterality: N/A;  . ESOPHAGOGASTRODUODENOSCOPY  08/30/2011   Procedure: ESOPHAGOGASTRODUODENOSCOPY (EGD);  Surgeon: RInda Castle MD;  [facility]  Service: Endoscopy;  Laterality: N/A;  Rm 3005   . HERNIA REPAIR    . JOINT REPLACEMENT     knee  . LEFT HEART CATHETERIZATION WITH CORONARY ANGIOGRAM N/A 11/30/2011   Procedure: LEFT HEART CATHETERIZATION WITH CORONARY ANGIOGRAM;  Surgeon: CBurnell Blanks MD;  [facility];  Service: Cardiovascular;  Laterality: N/A;  . NASAL SEPTOPLASTY W/ TURBINOPLASTY    . REFRACTIVE SURGERY     bilateral  . RIGHT HEART CATHETERIZATION Bilateral 11/30/2011   Procedure: RIGHT HEART CATH;  Surgeon: CBurnell Blanks MD;  [facility];  Service: Cardiovascular;  Laterality: Bilateral;  . right knee arthroscopy    . TEE WITHOUT CARDIOVERSION N/A 10/27/2015   Procedure: TRANSESOPHAGEAL ECHOCARDIOGRAM (TEE);  Surgeon: BLelon Perla MD;  [facility];  Service: Cardiovascular;  Laterality: N/A;       Home Medications    Prior to Admission medications   Medication Sig Start Date End Date Taking? Authorizing Provider  acetaminophen (TYLENOL) 325  MG tablet Take 650 mg by mouth every 6 (six) hours as needed for fever.     [provider]  carbidopa-levodopa (SINEMET IR) 25-100 MG tablet Take 3 tablets by mouth 4 (four) times daily.     [provider]  cyanocobalamin 500 MCG tablet Take 500 mcg by mouth 2 (two) times daily. Lunch/dinner    [provider]  cyclobenzaprine (FLEXERIL) 10 MG tablet Take 5 mg by mouth 3 (three) times daily as needed for muscle spasms.    [provider]  dexamethasone (DECADRON) 4 MG tablet Take 10 mg by mouth every 7 (seven) days. days 1, 8, 15 and 22 of 28 day cycle. 11/19/15   [provider]  diazepam (VALIUM) 2 MG tablet  Take 2 mg by mouth at bedtime as needed for anxiety.     [provider]  digoxin (LANOXIN) 0.125 MG tablet Take 1 tablet (0.125 mg total) by mouth daily. 08/23/16   Caren Griffins, MD  docusate sodium (COLACE) 100 MG capsule Take 100 mg by mouth 2 (two) times daily as needed for moderate constipation.  08/20/15   [provider]  finasteride (PROSCAR) 5 MG tablet Take 5 mg by mouth daily.    [provider]  fish oil-omega-3 fatty acids 1000 MG capsule Take 1 g by mouth daily with lunch.     [provider]  folic acid (FOLVITE) 1 MG tablet Take 1 mg by mouth daily. 09/24/15   [provider]  furosemide (LASIX) 40 MG tablet Take 1.5 tablets (60 mg total) by mouth daily. 08/15/16   Lelon Perla, MD  gabapentin (NEURONTIN) 800 MG tablet Take 400-800 mg by mouth See admin instructions. Take 1/2 tablet every morning and at bedtime then take 1 tablet at lunch and dinner    [provider]  hydrocortisone (ANUSOL-HC) 2.5 % rectal cream Place 1 application rectally 2 (two) times daily.    [provider]  metFORMIN (GLUCOPHAGE) 500 MG tablet Take 500 mg by mouth 2 (two) times daily with a meal.    [provider]  methylcellulose (ARTIFICIAL TEARS) 1 % ophthalmic solution Place 1  drop into both eyes at bedtime as needed (dry eyes).    [provider]  octreotide (SANDOSTATIN) 100 MCG/ML SOLN injection Inject 100 mcg into the skin every 12 (twelve) hours. .25 mL twice daily for 14 days---Starting 08/10/16    [provider]  pantoprazole (PROTONIX) 40 MG tablet Take 1 tablet (40 mg total) by mouth daily. For GERD Patient taking differently: Take 40 mg by mouth daily with lunch. For GERD 07/16/15   Rai, Vernelle Emerald, MD  Polyvinyl Alcohol-Povidone PF (REFRESH) 1.4-0.6 % SOLN Place 1 drop into both eyes 2 (two) times daily as needed (dry eyes).    [provider]  pomalidomide (POMALYST) 4 MG capsule Take 4 mg by mouth See admin instructions. Take with water on days 1-21 then  PATIENT IS OFF MED FOR 7 DAYS, Repeat every 28 days.    [provider]  simvastatin (ZOCOR) 40 MG tablet Take 20 mg by mouth at bedtime.  09/24/15   [provider]  traMADol (ULTRAM) 50 MG tablet Take 50 mg by mouth every 6 (six) hours as needed for moderate pain.    [provider]  UNABLE TO FIND Place 1 application into the right eye daily. Ocular Ointment     [provider]  zinc oxide 20 % ointment Apply 1 application topically 2 (two) times daily as needed for irritation.    [provider]    Family History Family History  Problem Relation Age of Onset  . Heart disease Father   . Osteoarthritis Mother   . Hypertension Sister   . Hyperlipidemia Unknown   . Cancer Maternal Aunt     Social History Social History  Substance Use Topics  . Smoking status: Former Smoker    Quit date: 09/30/1971  . Smokeless tobacco: Never Used     Comment: Quit August 973  . Alcohol use No     Allergies   Avodart [dutasteride]; Spironolactone; Codeine; Diazepam; Doxazosin; and Feraheme [ferumoxytol]   Review of Systems Review of Systems  Constitutional: Negative for fever.  Respiratory: Positive for shortness  of breath. Negative for  cough.   Cardiovascular: Positive for chest pain and leg swelling.  Gastrointestinal: Negative for vomiting.       Melena, chronic  Neurological: Positive for dizziness. Negative for headaches.  All other systems reviewed and are negative.    Physical Exam Updated Vital Signs BP (!) 109/54   Pulse (!) 51   Temp (!) 96 F (35.6 C) (Rectal)   Resp 14   SpO2 100%   Physical Exam  Constitutional: He is oriented to person, place, and time. He appears well-developed and well-nourished.  HENT:  Head: Normocephalic and atraumatic.  Right Ear: External ear normal.  Left Ear: External ear normal.  Nose: Nose normal.  Eyes: Right eye exhibits no discharge. Left eye exhibits no discharge.  Neck: Neck supple.  Cardiovascular: Regular rhythm and normal heart sounds.  Bradycardia present.   Pulses:      Radial pulses are 2+ on the right side, and 2+ on the left side.  Pulmonary/Chest: Effort normal. Tachypnea noted. He has decreased breath sounds in the right lower field and the left lower field. He has rales in the right lower field and the left lower field.  Abdominal: Soft. There is no tenderness.  Musculoskeletal: He exhibits edema (BLE pitting edema).  Neurological: He is alert and oriented to person, place, and time.  Skin: Skin is warm and dry. There is pallor.  Nursing note and vitals reviewed.    ED Treatments / Results  Labs (all labs ordered are listed, but only abnormal results are displayed) Labs Reviewed  COMPREHENSIVE METABOLIC PANEL - Abnormal; Notable for the following:       Result Value   Sodium 133 (*)    Creatinine, Ser 0.48 (*)    Total Protein 5.5 (*)    Albumin 3.1 (*)    AST 64 (*)    ALT <5 (*)    Alkaline Phosphatase 146 (*)    Total Bilirubin 3.3 (*)    All other components within normal limits  BRAIN NATRIURETIC PEPTIDE - Abnormal; Notable for the following:    B Natriuretic Peptide 416.3 (*)    All other components within normal limits  CBC WITH  DIFFERENTIAL/PLATELET - Abnormal; Notable for the following:    WBC 2.1 (*)    RBC 2.50 (*)    Hemoglobin 7.3 (*)    HCT 23.8 (*)    RDW 24.7 (*)    Platelets 103 (*)    Neutro Abs 1.1 (*)    Lymphs Abs 0.5 (*)    All other components within normal limits  DIGOXIN LEVEL - Abnormal; Notable for the following:    Digoxin Level 0.3 (*)    All other components within normal limits  I-STAT CHEM 8, ED - Abnormal; Notable for the following:    Creatinine, Ser 0.40 (*)    Hemoglobin 7.5 (*)    HCT 22.0 (*)    All other components within normal limits  CBG MONITORING, ED - Abnormal; Notable for the following:    Glucose-Capillary 101 (*)    All other components within normal limits  I-STAT ARTERIAL BLOOD GAS, ED - Abnormal; Notable for the following:    pH, Arterial 7.452 (*)    pO2, Arterial 167.0 (*)    All other components within normal limits  CULTURE, BLOOD (ROUTINE X 2)  CULTURE, BLOOD (ROUTINE X 2)  PROTIME-INR  URINALYSIS, ROUTINE W REFLEX MICROSCOPIC  I-STAT TROPONIN, ED  I-STAT CG4 LACTIC ACID, ED  POC OCCULT  BLOOD, ED    EKG  EKG Interpretation  Date/Time:  Sunday October 02 2016 20:04:34 EDT Ventricular Rate:  53 PR Interval:    QRS Duration: 121 QT Interval:  466 QTC Calculation: 438 R Axis:   9 Text Interpretation:  Atrial flutter with predominant 4:1 AV block Nonspecific intraventricular conduction delay Baseline wander in lead(s) I III aVR aVL no significant change compared to Sep 24 2016 Confirmed by Sherwood Gambler [phone]) on 10/02/2016 8:24:35 PM       Radiology Ct Angio Chest Pe W/cm &/or Wo Cm  Result Date: 10/02/2016 CLINICAL DATA:  PE suspected, high pretest prob. Shortness of breath. Epigastric pain. EXAM: CT ANGIOGRAPHY CHEST WITH CONTRAST TECHNIQUE: Multidetector CT imaging of the chest was performed using the standard protocol during bolus administration of intravenous contrast. Multiplanar CT image reconstructions and MIPs were obtained to evaluate  the vascular anatomy. CONTRAST:  100 cc Isovue 370 IV COMPARISON:  Radiograph earlier this day. Chest CT PE protocol 08/19/2016 FINDINGS: Cardiovascular: There are no filling defects within the pulmonary arteries to suggest pulmonary embolus. Prosthetic aortic valve with atherosclerosis of the thoracic aorta. No aortic dissection. Multi chamber cardiomegaly with contrast refluxing into the hepatic veins and IVC. There are coronary artery calcifications. Mediastinum/Nodes: Prominent mediastinal nodes. Subcarinal node is increased from prior currently measuring 13 mm short axis, likely reactive. No hilar adenopathy. The esophagus is patulous. Lungs/Pleura: Increased pulmonary edema from prior with increased septal thickening. Small right pleural effusion with fluid in the major fissure. Pain petechial nodular opacity in the anterior right upper lobe is unchanged and likely scarring. Elevated right hemidiaphragm with adjacent compressive atelectasis in the right lower lobe. No confluent consolidation. Upper Abdomen: Small upper abdominal ascites is increased from prior exam. Splenomegaly is again seen. Contrast refluxes into the hepatic veins and IVC. Musculoskeletal: No acute abnormality. Unchanged from prior exam. Post median sternotomy. Degenerative change in the spine with prominent Schmorl's nodes at T10 and L1. Review of the MIP images confirms the above findings. IMPRESSION: 1. No pulmonary embolus. 2. Worsening heart failure with increased pulmonary edema. Right heart failure with contrast refluxing into the hepatic veins and IVC. Small right pleural effusion. Small volume upper abdominal ascites is new from prior. 3. Coronary artery calcifications and mild aortic atherosclerosis. Multi chamber cardiomegaly. Aortic Atherosclerosis (ICD10-I70.0). Electronically Signed   By: Jeb Levering M.D.   On: 10/02/2016 22:25   Dg Chest Portable 1 View  Result Date: 10/02/2016 CLINICAL DATA:  Shortness of Breath  EXAM: PORTABLE CHEST 1 VIEW COMPARISON:  09/24/2016 FINDINGS: Cardiac shadow is mildly enlarged but stable. Postsurgical changes are again seen. The lungs are well aerated bilaterally. Mild increase in left basilar atelectasis is noted. No effusion is seen. No bony abnormality is noted. IMPRESSION: Left basilar atelectasis. Electronically Signed   By: Inez Catalina M.D.   On: 10/02/2016 20:49    Procedures Procedures (including critical care time)  Medications Ordered in ED Medications  iopamidol (ISOVUE-370) 76 % injection (not administered)  iopamidol (ISOVUE-370) 76 % injection (100 mLs Intravenous Contrast Given 10/02/16 2126)  furosemide (LASIX) injection 60 mg (60 mg Intravenous Given 10/02/16 2253)     Initial Impression / Assessment and Plan / ED Course  I have reviewed the triage vital signs and the nursing notes.  Pertinent labs & imaging results that were available during my care of the patient were reviewed by me and considered in my medical decision making (see chart for details).     Patient is  significantly improved compared to EMS description of his dyspnea and lethargy. He is awake and alert and talks to me normally. He does appear to have some JVD and peripheral swelling. Initial chest x-ray does not show significant abnormalities and so given his multiple comorbidities and recent admissions a CT was obtained to rule out PE. This is not show PE but shows CHF. Thus he was given IV Lasix. Of note, it seems that he stopped the Lasix because he does not like how it makes him urinate often and it causes him discomfort. Discussed that is unfortunate necessary. He will be admitted to the internal medicine teaching service for further diuresis.  Final Clinical Impressions(s) / ED Diagnoses   Final diagnoses:  Acute on chronic diastolic congestive heart failure (HCC)    New Prescriptions New Prescriptions   No medications on file     Sherwood Gambler, MD 10/02/16 2332

## 2016-10-03 ENCOUNTER — Inpatient Hospital Stay (HOSPITAL_COMMUNITY): Payer: Medicare Other

## 2016-10-03 ENCOUNTER — Encounter (HOSPITAL_COMMUNITY): Payer: Self-pay | Admitting: Emergency Medicine

## 2016-10-03 DIAGNOSIS — Z79899 Other long term (current) drug therapy: Secondary | ICD-10-CM

## 2016-10-03 DIAGNOSIS — N400 Enlarged prostate without lower urinary tract symptoms: Secondary | ICD-10-CM | POA: Diagnosis not present

## 2016-10-03 DIAGNOSIS — K5900 Constipation, unspecified: Secondary | ICD-10-CM

## 2016-10-03 DIAGNOSIS — F419 Anxiety disorder, unspecified: Secondary | ICD-10-CM

## 2016-10-03 DIAGNOSIS — E785 Hyperlipidemia, unspecified: Secondary | ICD-10-CM | POA: Diagnosis not present

## 2016-10-03 DIAGNOSIS — I4892 Unspecified atrial flutter: Secondary | ICD-10-CM | POA: Diagnosis not present

## 2016-10-03 DIAGNOSIS — C9000 Multiple myeloma not having achieved remission: Secondary | ICD-10-CM | POA: Diagnosis not present

## 2016-10-03 DIAGNOSIS — I5033 Acute on chronic diastolic (congestive) heart failure: Secondary | ICD-10-CM

## 2016-10-03 DIAGNOSIS — K219 Gastro-esophageal reflux disease without esophagitis: Secondary | ICD-10-CM | POA: Diagnosis not present

## 2016-10-03 DIAGNOSIS — D500 Iron deficiency anemia secondary to blood loss (chronic): Secondary | ICD-10-CM | POA: Diagnosis not present

## 2016-10-03 LAB — CBC
HCT: 26.5 % — ABNORMAL LOW (ref 39.0–52.0)
Hemoglobin: 8.2 g/dL — ABNORMAL LOW (ref 13.0–17.0)
MCH: 28.4 pg (ref 26.0–34.0)
MCHC: 30.9 g/dL (ref 30.0–36.0)
MCV: 91.7 fL (ref 78.0–100.0)
PLATELETS: 107 10*3/uL — AB (ref 150–400)
RBC: 2.89 MIL/uL — ABNORMAL LOW (ref 4.22–5.81)
RDW: 28.6 % — AB (ref 11.5–15.5)
WBC: 2.3 10*3/uL — AB (ref 4.0–10.5)

## 2016-10-03 LAB — URINALYSIS, ROUTINE W REFLEX MICROSCOPIC
Bacteria, UA: NONE SEEN
Bilirubin Urine: NEGATIVE
GLUCOSE, UA: NEGATIVE mg/dL
KETONES UR: NEGATIVE mg/dL
Leukocytes, UA: NEGATIVE
NITRITE: NEGATIVE
PROTEIN: NEGATIVE mg/dL
RBC / HPF: NONE SEEN RBC/hpf (ref 0–5)
SPECIFIC GRAVITY, URINE: 1.008 (ref 1.005–1.030)
Squamous Epithelial / LPF: NONE SEEN
pH: 7 (ref 5.0–8.0)

## 2016-10-03 LAB — BASIC METABOLIC PANEL
Anion gap: 6 (ref 5–15)
BUN: 8 mg/dL (ref 6–20)
CALCIUM: 9.6 mg/dL (ref 8.9–10.3)
CHLORIDE: 104 mmol/L (ref 101–111)
CO2: 27 mmol/L (ref 22–32)
CREATININE: 0.61 mg/dL (ref 0.61–1.24)
GFR calc Af Amer: >60 mL/min (ref >60)
GFR calc non Af Amer: >60 mL/min (ref >60)
Glucose, Bld: 97 mg/dL (ref 65–99)
Potassium: 3.7 mmol/L (ref 3.5–5.1)
SODIUM: 137 mmol/L (ref 135–145)

## 2016-10-03 LAB — GLUCOSE, CAPILLARY
GLUCOSE-CAPILLARY: 85 mg/dL (ref 65–99)
Glucose-Capillary: 100 mg/dL — ABNORMAL HIGH (ref 65–99)

## 2016-10-03 LAB — CBG MONITORING, ED
GLUCOSE-CAPILLARY: 176 mg/dL — AB (ref 65–99)
Glucose-Capillary: 89 mg/dL (ref 65–99)

## 2016-10-03 LAB — DIGOXIN LEVEL: DIGOXIN LVL: 0.2 ng/mL — AB (ref 0.8–2.0)

## 2016-10-03 LAB — PREPARE RBC (CROSSMATCH)

## 2016-10-03 MED ORDER — SIMVASTATIN 20 MG PO TABS
20.0000 mg | ORAL_TABLET | Freq: Every day | ORAL | Status: DC
  Administered 2016-10-03: 20 mg via ORAL
  Filled 2016-10-03: qty 1

## 2016-10-03 MED ORDER — FOLIC ACID 1 MG PO TABS
1.0000 mg | ORAL_TABLET | Freq: Every day | ORAL | Status: DC
  Administered 2016-10-03 – 2016-10-04 (×2): 1 mg via ORAL
  Filled 2016-10-03 (×2): qty 1

## 2016-10-03 MED ORDER — ACETAMINOPHEN 650 MG RE SUPP: 650.0000 mg | Freq: Four times a day (QID) | RECTAL | Status: DC | PRN

## 2016-10-03 MED ORDER — FUROSEMIDE 10 MG/ML IJ SOLN
40.0000 mg | Freq: Two times a day (BID) | INTRAMUSCULAR | Status: DC
  Administered 2016-10-03: 40 mg via INTRAVENOUS
  Filled 2016-10-03 (×2): qty 4

## 2016-10-03 MED ORDER — ENOXAPARIN SODIUM 40 MG/0.4ML ~~LOC~~ SOLN: 40.0000 mg | SUBCUTANEOUS | Status: DC

## 2016-10-03 MED ORDER — TRAMADOL HCL 50 MG PO TABS
50.0000 mg | ORAL_TABLET | Freq: Four times a day (QID) | ORAL | Status: DC | PRN
  Administered 2016-10-03: 50 mg via ORAL
  Filled 2016-10-03: qty 1

## 2016-10-03 MED ORDER — HYDROCORTISONE 2.5 % RE CREA
1.0000 "application " | TOPICAL_CREAM | Freq: Two times a day (BID) | RECTAL | Status: DC
  Administered 2016-10-03: 1 via RECTAL
  Filled 2016-10-03: qty 28.35

## 2016-10-03 MED ORDER — INSULIN ASPART 100 UNIT/ML ~~LOC~~ SOLN
0.0000 [IU] | Freq: Three times a day (TID) | SUBCUTANEOUS | Status: DC
Start: 2016-10-03 — End: 2016-10-04
  Administered 2016-10-03: 2 [IU] via SUBCUTANEOUS
  Filled 2016-10-03: qty 1

## 2016-10-03 MED ORDER — POLYVINYL ALCOHOL 1.4 % OP SOLN
1.0000 [drp] | Freq: Two times a day (BID) | OPHTHALMIC | Status: DC | PRN
  Administered 2016-10-03: 1 [drp] via OPHTHALMIC
  Filled 2016-10-03: qty 15

## 2016-10-03 MED ORDER — ONDANSETRON HCL 4 MG PO TABS: 4.0000 mg | ORAL_TABLET | Freq: Four times a day (QID) | ORAL | Status: DC | PRN

## 2016-10-03 MED ORDER — DOCUSATE SODIUM 100 MG PO CAPS: 100.0000 mg | ORAL_CAPSULE | Freq: Two times a day (BID) | ORAL | Status: DC | PRN

## 2016-10-03 MED ORDER — OMEGA-3 FATTY ACIDS 1000 MG PO CAPS: 1.0000 g | ORAL_CAPSULE | Freq: Every day | ORAL | Status: DC

## 2016-10-03 MED ORDER — CARBIDOPA-LEVODOPA 25-100 MG PO TABS
3.0000 | ORAL_TABLET | Freq: Four times a day (QID) | ORAL | Status: DC
  Administered 2016-10-03 – 2016-10-04 (×5): 3 via ORAL
  Filled 2016-10-03 (×7): qty 3

## 2016-10-03 MED ORDER — DIGOXIN 125 MCG PO TABS
0.1250 mg | ORAL_TABLET | Freq: Every day | ORAL | Status: DC
  Administered 2016-10-03 – 2016-10-04 (×2): 0.125 mg via ORAL
  Filled 2016-10-03 (×2): qty 1

## 2016-10-03 MED ORDER — FUROSEMIDE 10 MG/ML IJ SOLN: 40.0000 mg | Freq: Two times a day (BID) | INTRAMUSCULAR | Status: DC

## 2016-10-03 MED ORDER — ONDANSETRON HCL 4 MG/2ML IJ SOLN: 4.0000 mg | Freq: Four times a day (QID) | INTRAMUSCULAR | Status: DC | PRN

## 2016-10-03 MED ORDER — SENNOSIDES-DOCUSATE SODIUM 8.6-50 MG PO TABS
1.0000 | ORAL_TABLET | Freq: Every evening | ORAL | Status: DC | PRN
  Administered 2016-10-03 – 2016-10-04 (×2): 2 via ORAL
  Filled 2016-10-03: qty 2

## 2016-10-03 MED ORDER — OMEGA-3-ACID ETHYL ESTERS 1 G PO CAPS
1.0000 g | ORAL_CAPSULE | Freq: Every day | ORAL | Status: DC
  Administered 2016-10-03: 1 g via ORAL
  Filled 2016-10-03 (×2): qty 1

## 2016-10-03 MED ORDER — GABAPENTIN 400 MG PO CAPS: 400.0000 mg | ORAL_CAPSULE | Freq: Every day | ORAL | Status: DC

## 2016-10-03 MED ORDER — VITAMIN B-12 1000 MCG PO TABS
500.0000 ug | ORAL_TABLET | Freq: Two times a day (BID) | ORAL | Status: DC
  Administered 2016-10-03 – 2016-10-04 (×3): 500 ug via ORAL
  Filled 2016-10-03 (×3): qty 1

## 2016-10-03 MED ORDER — GABAPENTIN 400 MG PO CAPS
800.0000 mg | ORAL_CAPSULE | Freq: Two times a day (BID) | ORAL | Status: DC
  Administered 2016-10-03 (×2): 800 mg via ORAL
  Filled 2016-10-03 (×2): qty 2

## 2016-10-03 MED ORDER — FINASTERIDE 5 MG PO TABS
5.0000 mg | ORAL_TABLET | Freq: Every day | ORAL | Status: DC
  Administered 2016-10-03 – 2016-10-04 (×2): 5 mg via ORAL
  Filled 2016-10-03 (×2): qty 1

## 2016-10-03 MED ORDER — METHYLCELLULOSE 1 % OP SOLN: 1.0000 [drp] | Freq: Every evening | OPHTHALMIC | Status: DC | PRN

## 2016-10-03 MED ORDER — ACETAMINOPHEN 325 MG PO TABS: 650.0000 mg | ORAL_TABLET | Freq: Four times a day (QID) | ORAL | Status: DC | PRN

## 2016-10-03 MED ORDER — PANTOPRAZOLE SODIUM 40 MG PO TBEC
40.0000 mg | DELAYED_RELEASE_TABLET | Freq: Every day | ORAL | Status: DC
  Administered 2016-10-03: 40 mg via ORAL
  Filled 2016-10-03: qty 1

## 2016-10-03 MED ORDER — DIAZEPAM 2 MG PO TABS
2.0000 mg | ORAL_TABLET | Freq: Every evening | ORAL | Status: DC | PRN
  Administered 2016-10-03: 2 mg via ORAL
  Filled 2016-10-03: qty 1

## 2016-10-03 NOTE — Progress Notes (Signed)
Internal Medicine Attending  Date: 10/03/2016  Patient name: [name] Medical record number: [id number] Date of birth: Aug 04, 1945 Age: 71 y.o. Gender: male  I saw and evaluated the patient. I reviewed the resident's note by Dr. Ronalee Red and I agree with the resident's findings and plans as documented in her progress note.  Please see my H&P dated 10/03/2016 and attached to Dr. Rivka Safer H&P dated 10/02/2016 for the specifics of my evaluation, assessment, and plan from earlier in the day.

## 2016-10-03 NOTE — ED Notes (Signed)
Pt delivered heart healthy tray.

## 2016-10-03 NOTE — Discharge Summary (Signed)
Name: [name] MRN: [id number] DOB: 1945/06/15 71 y.o. PCP: [facility], [facility]  Date of Admission: 10/02/2016  8:01 PM Date of Discharge: 10/04/2016 Attending Physician: No att. providers found  Discharge Diagnosis: 1. Acute on Chronic HFpEF exacerbation 2. Symptomatic Anemia  Discharge Medications: Allergies as of 10/04/2016      Reactions   Avodart [dutasteride] Other (See Comments)   Lose of use of arms and legs   Spironolactone Other (See Comments)   Low tolerance    Codeine Other (See Comments)   GI upset in large doses   Diazepam Other (See Comments)   Mood changes   Doxazosin Other (See Comments)   Dizziness   Feraheme [ferumoxytol] Swelling   Pedal edema      Medication List    TAKE these medications   acetaminophen 325 MG tablet Commonly known as:  TYLENOL Take 650 mg by mouth every 6 (six) hours as needed for fever.   carbidopa-levodopa 25-100 MG tablet Commonly known as:  SINEMET IR Take 3 tablets by mouth 4 (four) times daily.   cyanocobalamin 500 MCG tablet Take 500 mcg by mouth 2 (two) times daily. Lunch/dinner   cyclobenzaprine 10 MG tablet Commonly known as:  FLEXERIL Take 5 mg by mouth 3 (three) times daily as needed for muscle spasms.   dexamethasone 4 MG tablet Commonly known as:  DECADRON Take 10 mg by mouth every 7 (seven) days. days 1, 8, 15 and 22 of 28 day cycle.   diazepam 2 MG tablet Commonly known as:  VALIUM Take 2 mg by mouth at bedtime as needed for anxiety.   digoxin 0.125 MG tablet Commonly known as:  LANOXIN Take 1 tablet (0.125 mg total) by mouth daily.   docusate sodium 100 MG capsule Commonly known as:  COLACE Take 100 mg by mouth 2 (two) times daily as needed for moderate constipation.   finasteride 5 MG tablet Commonly known as:  PROSCAR Take 5 mg by mouth daily.   fish oil-omega-3 fatty acids 1000 MG capsule Take 1 g by mouth daily with lunch.   folic acid 1 MG tablet Commonly known as:   FOLVITE Take 1 mg by mouth daily.   furosemide 40 MG tablet Commonly known as:  LASIX Take 1.5 tablets (60 mg total) by mouth daily.   gabapentin 800 MG tablet Commonly known as:  NEURONTIN Take 400-800 mg by mouth See admin instructions. Take 1/2 tablet every morning and at bedtime then take 1 tablet at lunch and dinner   hydrocortisone 2.5 % rectal cream Commonly known as:  ANUSOL-HC Place 1 application rectally 2 (two) times daily.   metFORMIN 500 MG tablet Commonly known as:  GLUCOPHAGE Take 500 mg by mouth 2 (two) times daily with a meal.   methylcellulose 1 % ophthalmic solution Commonly known as:  ARTIFICIAL TEARS Place 1 drop into both eyes at bedtime as needed (dry eyes).   pantoprazole 40 MG tablet Commonly known as:  PROTONIX Take 1 tablet (40 mg total) by mouth daily. For GERD What changed:  when to take this  additional instructions   pomalidomide 4 MG capsule Commonly known as:  POMALYST Take 4 mg by mouth See admin instructions. Take with water on days 1-21 then  PATIENT IS OFF MED FOR 7 DAYS, Repeat every 28 days.   REFRESH 1.4-0.6 % Soln Generic drug:  Polyvinyl Alcohol-Povidone PF Place 1 drop into both eyes 2 (two) times daily as needed (dry eyes).   simvastatin 40 MG  tablet Commonly known as:  ZOCOR Take 20 mg by mouth at bedtime.   traMADol 50 MG tablet Commonly known as:  ULTRAM Take 50 mg by mouth every 6 (six) hours as needed for moderate pain.   UNABLE TO FIND Place 1 application into the right eye daily. Ocular Ointment   zinc oxide 20 % ointment Apply 1 application topically 2 (two) times daily as needed for irritation.            Discharge Care Instructions        Start     Ordered   10/04/16 0000  Increase activity slowly     10/04/16 1030   10/04/16 0000  Diet - low sodium heart healthy     10/04/16 1030   10/04/16 0000  Call MD for:  temperature >100.4     10/04/16 1030   10/04/16 0000  Call MD for:  severe  uncontrolled pain     10/04/16 1030   10/04/16 0000  Call MD for:  difficulty breathing, headache or visual disturbances     10/04/16 1030   10/04/16 0000  Call MD for:  persistant dizziness or light-headedness     10/04/16 1030   10/04/16 0000  Call MD for:  extreme fatigue     10/04/16 1030      Disposition and follow-up:   [name] was discharged from [facility] in Stable condition.  At the hospital follow up visit please address:  1.  Acute on Chronic HFpEF exacerbation, likely secondary to not taking lasix 57m daily as prescribed. Patient told uKoreathat PO lasix bothers his genital area and that is why he does not take it. (IV lasix does not have the same effect). As a compromise to help prevent future hospitalizations for HF exacerbation, we discussed having the patient measure himself immediately upon discharge to use as his dry weight. He will then weigh himself daily and if his weight is up 3 pounds from his dry weight, he will take lasix that day. If it is less than 3 pounds up, he will not take lasix that day. We hope that this increases his compliance with his home lasix dose. Please follow up on this regimen to see how it is working.  2.  Symptomatic Anemia, secondary to MM and chronic GI bleed requiring frequent transfusions. Patient's goal Hb is 8, as set by his hematologist. His frequent transfusions likely are contributing to his fluid overload, however patient states that when his Hb gets below 8, his legs start feeling restless.  3.  [facility] need [facility]/ imaging needed at time of follow-up: None  5.  Pending labs/ test needing follow-up: BCx  Follow-up Appointments: Follow-up Information    Cl[facility], [place] day(s).   [place] information: [id number]          Hospital Course by problem list:  1. Acute on  Chronic HFpEF exacerbation Patient presented on 8/26 with progressive shortness of breath and lower extremity edema after not taking his home lasix dose that morning and likely inconsistent compliance on other days as well, given that patient informed uKoreathat PO lasix irritates his genital area. BNP elevated to 416. CT-A with no PE and worsening heart failure with increased pulmonary edema. He diuresed well with IV lasix with improvement in his symptoms. He was discharged on home PO  lasix 27m daily, to be taken when his daily weight is at least 3 pounds above dry weight. Our hope is that this regimen will increase his compliance with his home lasix.  2. Symptomatic Anemia Patient had received his last transfusion on Friday 8/24 for low hemoglobin. Hb on admission was 7.3, which increased to 8.2 after 1u pRBC transfusion. His weakness and shortness of breath improved. Hb on discharge was 8.8.  3. Chronic Atrial Flutter, rate controlled on home digoxin Digoxin levels were subtherapeutic on admission, but HR remained in 60s-80s. CHADS2-VASc score of at least 4, however patient is not a candidate for anticoagulation due to chronic GI bleed.  4. Multiple Myeloma Patient was scheduled for PET scan on 8/27, however he did not make this appointment due to his hospitalization. Will need to reschedule and [facility]  Discharge Vitals:   BP 119/64 (BP Location: Right Arm)   Pulse 72   Temp 98.4 F (36.9 C) (Oral)   Resp 18   Ht 5' 9"  (1.753 m)   Wt 168 lb 14.4 oz (76.6 kg)   SpO2 100%   BMI 24.94 kg/m   Pertinent Labs, Studies, and Procedures:  CBC Latest Ref Rng & Units 10/04/2016 10/03/2016 10/02/2016  WBC 4.0 - 10.5 K/uL 2.5(L) 2.3(L) -  Hemoglobin 13.0 - 17.0 g/dL 8.8(L) 8.2(L) 7.5(L)  Hematocrit 39.0 - 52.0 % 28.1(L) 26.5(L) 22.0(L)  Platelets 150 - 400 K/uL 118(L) 107(L) -   CMP Latest Ref Rng & Units 10/04/2016 10/03/2016 10/02/2016  Glucose 65 - 99 mg/dL 96 97 97   BUN 6 - 20 mg/dL 7 8 10   Creatinine 0.61 - 1.24 mg/dL 0.59(L) 0.61 0.40(L)  Sodium 135 - 145 mmol/L 135 137 136  Potassium 3.5 - 5.1 mmol/L 3.6 3.7 3.7  Chloride 101 - 111 mmol/L 100(L) 104 102  CO2 22 - 32 mmol/L 29 27 -  Calcium 8.9 - 10.3 mg/dL 9.9 9.6 -  Total Protein 6.5 - 8.1 g/dL - - -  Total Bilirubin 0.3 - 1.2 mg/dL - - -  Alkaline Phos 38 - 126 U/L - - -  AST 15 - 41 U/L - - -  ALT 17 - 63 U/L - - -   Trop negative ABG 7.452/35.1/167.0 BNP 416 UA with small Hgb Digoxin level 0.3 -> 0.2 BCx NGTD (on day 2)  CXR 8/26 Left basilar atelectasis  CT-A chest 8/26 1. No pulmonary embolus. 2. Worsening heart failure with increased pulmonary edema. Right heart failure with contrast refluxing into the hepatic veins and IVC. Small right pleural effusion. Small volume upper abdominal ascites is new from prior. 3. Coronary artery calcifications and mild aortic atherosclerosis. Multi chamber cardiomegaly.  Discharge Instructions: Discharge Instructions    Call MD for:  difficulty breathing, headache or visual disturbances    Complete by:  As directed    Call MD for:  extreme fatigue    Complete by:  As directed    Call MD for:  persistant dizziness or light-headedness    Complete by:  As directed    Call MD for:  severe uncontrolled pain    Complete by:  As directed    Call MD for:  temperature >100.4    Complete by:  As directed    Diet - low sodium heart healthy    Complete by:  As directed    Increase activity slowly    Complete by:  As directed       Signed:  Colbert Ewing, MD  Internal Medicine, PGY-1 10/04/2016, 3:23 PM   P [phone]

## 2016-10-03 NOTE — Progress Notes (Signed)
Subjective:  [name] was seen lying in bed comfortably this morning. He reports that his shortness of breath is improved and that he still feels a bit weak. Received IV lasix 53m yesterday and is net negative 3L. He states that he did not sleep well last night because of the lasix, so we will adjust his lasix regimen so that his last dose of lasix is before 2pm.  He also tells uKoreathat the PO lasix irritates his genital area and that's why he does not like to take it. He says the IV lasix does not affect him that way. We had a discussion regarding how he can take his lasix to prevent him from coming back to the ED so frequently. [name] prefers to weigh himself daily and if he is 3 pounds over dry weight, he will take his lasix for that day.  [facility] but states that he is feeling too weak to be able to go to that and would like to stay in the hospital and re-schedule the scan.  Objective:  Vital signs in last 24 hours: Vitals:   10/03/16 0530 10/03/16 0538 10/03/16 0600 10/03/16 0630  BP: 112/62 105/72 116/68 104/69  Pulse: 71 73 71 65  Resp: _0 Temp:  98.7 F (37.1 C)    TempSrc:      SpO2: 100%  100% 99%  Weight:       GEN: Elderly male lying in bed in NAD; overall pallor; alert and oriented RESP: Crackles in bilateral lung bases CV: Normal rate and regular rhythm. Holosystolic murmur best heard at RUSB. 2+ BLE edema. JVD elevated ~10cm H2O while lying flat.   EXT: 2+ BLE edema. Warm. Erythema on bilateral lower calves.  Assessment/Plan:  Active Problems:   Pulmonary edema  [name] a [name] with a past medical history of multiple myeloma, HFpEF, and chronic lower GI bleed presenting with progressive shortness of breath and lower extremity edema admitted for management of acute on chronic HF exacerbation and symptomatic anemia. Net negative 3L with IV lasix with improvement in shortness of  breath and weakness.  Acute on Chronic HFpEF:  Net negative 3L with IV lasix 675myesterday. O2 sats stable on RA. Continues to have some bibasilar crackles and LE edema on physical exam. Cr stable at 0.61. Will continue to diurese with IV lasix 4010mID. Will adjust last dose of lasix to be before 2pm due to [name]'s inability to get sleep if it is dosed later. [name] states that PO lasix irritates him and that is why he does not take it. I suspect there are many days that he chooses not to take his lasix. Fluid overload also complicated by his frequent blood transfusions. Consider discussing adjusting Hb goal to 7 rather than 8, however [name] states that his legs feel restless when he gets below 8. Will leave this up to outpatient hematologist. Discussed outpatient plan for [name] to take lasix only when weight is up 3 pounds from dry weight in hope of better compliance with PO lasix. -- Continue IV lasix 40 mg BID tomorrow, PM dose scheduled for 1pm. -- Fluid restrict 1.5L -- Strict I/Os -- Daily weights -- Telemetry -- Pulse ox -- Supplemental oxygen prn to maintain saturation > 92% -- BMP tomorrow AM  Symptomatic Anemia:  Secondary to multiple myeloma and chronic GI bleed requiring frequent transfusion. S/p 1u pRBC yesterday. Hb  improved from 7.3 to 8.2. Respiratory distress is likely multifactorial due to symptomatic anemia and CHF exacerbation. -- AM CBC -- Monitor for signs and sxs of anemia  Multiple Myeloma:  With pancytopenia. Follows with oncology at [facility], scheduled for PET scan today at 12:30p. [name] states he still feels too weak and prefers to re-schedule the PET scan. Will plan on monitoring him overnight to see how he does with diuresis and allowing for [name] to reschedule. -- [facility] at [facility] -- Continue home tramadol prn  Diabetes:  -- Hold home metformin -- SSI-sensitive TID w/ meals  Chronic Atrial Flutter: With 4:1 block, rate controlled on  home digoxin although levels are subtherapeutic. CHADS2-VASc score of at least 4 however not a candidate for anticoagulation due to chronic GI bleed. HR 73, digoxin level this morning still subtherapeutic. -- Continue home digoxin 0.125 mg daily -- Telemetry  GERD: -- Continue home protonix 40 mg daily  Anxiety:  -- Continue home valium prn   HLD:  -- Continue home simvastatin 40 mg QHS -- Continue home fish oil supplements   BPH:  -- Continue home finasteride 5 mg daily   Constipation: -- Continue home docusate BID prn   Other: -- Continue home Carbidopa-levodopa   FEN: Fluid restrict 1.5L, replete lytes prn, heart healthy/carb mod VTE ppx: SCDs Code Status: FULL   Dispo: Anticipated discharge in approximately 1-2 day(s).   Colbert Ewing, MD 10/03/2016, 8:14 AM Pager: Mamie Nick [phone]

## 2016-10-03 NOTE — Progress Notes (Signed)
PT Cancellation Note  Patient Details Name: [name] MRN: [id number] DOB: 25-Jun-1945   Cancelled Treatment:    Reason Eval/Treat Not Completed: Patient declined, no reason specified (pt reported fatigue and not wanting to mobilize today with request to attempt next date)   Sandy Salaam Dinesha Twiggs 10/03/2016, 2:21 PM  Elwyn Reach, [place]

## 2016-10-03 NOTE — ED Notes (Signed)
Pt up to the bedside commode 

## 2016-10-03 NOTE — Progress Notes (Signed)
Was called in patient room complaining of SOB, lungs clear/dim.  O2 Sats on RA 100% , v/s checked (see flowsheet) .Will monitor

## 2016-10-03 NOTE — Progress Notes (Signed)
Patient was placed on O2 for comfort.Spoke with Dr. Juleen China made aware of patients breathing. Night coverage will be coming in to see patient. Will endorsed accordingly.

## 2016-10-03 NOTE — ED Notes (Signed)
Breakfast tray ordered 

## 2016-10-03 NOTE — ED Notes (Signed)
Pt eating heart healthy tray

## 2016-10-03 NOTE — ED Notes (Addendum)
Blood unit has infused  No reaction

## 2016-10-03 NOTE — ED Notes (Addendum)
Pt CBG 176, nurse was notified.

## 2016-10-03 NOTE — ED Notes (Signed)
Pt up to the bedside commode again

## 2016-10-03 NOTE — Progress Notes (Addendum)
21:30 - Paged due to patient complaints of shortness of breath and abdominal distention. Went to see patient. His breathing is unchanged from yesterday, but continues to oxygenate well on room air. He continues to have bibasilar rales on exam. He was place on O2 by nasal cannula for comfort and a CXR was ordered. His abdomen is also somewhat distended. Ascites were noted on CT, patient has not had a bowl movement in several days, but is passing gas. Will order KUB to evaluate for obstruction.  Pearson Grippe, DO IM PGY-1 Pager: [phone]  22:30 - CXR showed improving pulmonary edema. KUB showed nonobstructed bowl gas pattern consistent with patient's constipation. SOB believed to be exacerbated today due to increased pressure on diaphragm from constipation. Ordered Sennokot-S, consider Magnesium Citrate in the morning if no bowl movement.

## 2016-10-04 LAB — BASIC METABOLIC PANEL
Anion gap: 6 (ref 5–15)
BUN: 7 mg/dL (ref 6–20)
CALCIUM: 9.9 mg/dL (ref 8.9–10.3)
CHLORIDE: 100 mmol/L — AB (ref 101–111)
CO2: 29 mmol/L (ref 22–32)
CREATININE: 0.59 mg/dL — AB (ref 0.61–1.24)
Glucose, Bld: 96 mg/dL (ref 65–99)
Potassium: 3.6 mmol/L (ref 3.5–5.1)
Sodium: 135 mmol/L (ref 135–145)

## 2016-10-04 LAB — CBC
HCT: 28.1 % — ABNORMAL LOW (ref 39.0–52.0)
Hemoglobin: 8.8 g/dL — ABNORMAL LOW (ref 13.0–17.0)
MCH: 28.5 pg (ref 26.0–34.0)
MCHC: 31.3 g/dL (ref 30.0–36.0)
MCV: 90.9 fL (ref 78.0–100.0)
PLATELETS: 118 10*3/uL — AB (ref 150–400)
RBC: 3.09 MIL/uL — ABNORMAL LOW (ref 4.22–5.81)
RDW: 28.2 % — ABNORMAL HIGH (ref 11.5–15.5)
WBC: 2.5 10*3/uL — AB (ref 4.0–10.5)

## 2016-10-04 LAB — TYPE AND SCREEN
ABO/RH(D): A NEG
Antibody Screen: NEGATIVE
UNIT DIVISION: 0

## 2016-10-04 LAB — GLUCOSE, CAPILLARY: GLUCOSE-CAPILLARY: 102 mg/dL — AB (ref 65–99)

## 2016-10-04 LAB — BPAM RBC
Blood Product Expiration Date: 201809102359
ISSUE DATE / TIME: 201808270222
Unit Type and Rh: 600

## 2016-10-04 NOTE — Progress Notes (Signed)
Internal Medicine Attending  Date: 10/04/2016  Patient name: [name] Medical record number: [id number] Date of birth: 1945/03/14 Age: 71 y.o. Gender: male  I saw and evaluated the patient. I reviewed the resident's note by Dr. Dr. Ronalee Red and I agree with the resident's findings and plans as documented in her progress note.  When seen on rounds this morning [name] was feeling well. He denied shortness of breath or dizziness. He was looking forward to going home. He understood our plan to follow daily weights to better inform when he should take his Lasix. We are hopeful that with this compromise he will have better control of his heart failure symptoms and avoid future admissions.

## 2016-10-04 NOTE — Progress Notes (Signed)
Patient refusing Lasix dose this morning.Per patient MD said"no Lasix this morning" but order has not been d/c'd. I offered medication 3x but still refusing, wife at bedside and said the same thing.

## 2016-10-04 NOTE — Progress Notes (Signed)
Subjective:  Mr. Prevette was seen sitting in bed this morning. He reports that his shortness of breath and strength are improved. Received IV lasix 4m yesterday afternoon and was net negative 1.3L over 24 hours.  He was given sennokot for constipation; has not yet had a bowel movement, but states that he has sennokot at home and is looking forward to going home today.  Objective:  Vital signs in last 24 hours: Vitals:   10/03/16 1800 10/04/16 0023 10/04/16 0623 10/04/16 0948  BP: 105/87 107/64 119/64   Pulse: (!) 56 72 72 72  Resp: 18 20 18    Temp:  98.2 F (36.8 C) 98.4 F (36.9 C)   TempSrc:  Oral Oral   SpO2: 100% 97% 100%   Weight:   168 lb 14.4 oz (76.6 kg)   Height:       GEN: Elderly male sitting on the side of the bed in NAD; mildly jaundiced RESP: Lungs clear to auscultation. CV: NR & RR. Holosystolic murmur best heard at RUSB. Mild BLE edema. EXT: Mild BLE edema. Warm. Erythema on bilateral lower calves.  Labs CBC Latest Ref Rng & Units 10/04/2016 10/03/2016 10/02/2016  WBC 4.0 - 10.5 K/uL 2.5(L) 2.3(L) -  Hemoglobin 13.0 - 17.0 g/dL 8.8(L) 8.2(L) 7.5(L)  Hematocrit 39.0 - 52.0 % 28.1(L) 26.5(L) 22.0(L)  Platelets 150 - 400 K/uL 118(L) 107(L) -   BMP Latest Ref Rng & Units 10/04/2016 10/03/2016 10/02/2016  Glucose 65 - 99 mg/dL 96 97 97  BUN 6 - 20 mg/dL 7 8 10   Creatinine 0.61 - 1.24 mg/dL 0.59(L) 0.61 0.40(L)  Sodium 135 - 145 mmol/L 135 137 136  Potassium 3.5 - 5.1 mmol/L 3.6 3.7 3.7  Chloride 101 - 111 mmol/L 100(L) 104 102  CO2 22 - 32 mmol/L 29 27 -  Calcium 8.9 - 10.3 mg/dL 9.9 9.6 -   CXR 8/27 Borderline cardiomegaly.  No overt edema or infiltrate.  KUB 8/27 Nonobstructed gas pattern  Assessment/Plan:  Active Problems:   Pulmonary edema   Anemia due to chronic blood loss  [name] a 71year old gentleman with a past medical history of multiple myeloma, HFpEF, and chronic lower GI bleed presenting with progressive shortness of breath and  lower extremity edema admitted for management of acute on chronic HF exacerbation and symptomatic anemia. Diuresing well with IV lasix with improvement in shortness of breath and weakness.  Acute on Chronic HFpEF: Net negative 1.3L with IV lasix 414myesterday. Weight down 171 -> 168 since admission. O2 sats stable on RA. Continues to have LE edema on exam. Cr stable at 0.59. Will switch lasix to home dose of PO 6052maily. Patient has had inconsistent compliance with his home lasix, stating that it bothers his genital area and he sometimes doesn't take it because he doesn't want to urinate too much. To attempt to improve compliance, we again discussed our plan to have the patient weigh himself with his home scale right when he gets home today and to write that down as his dry weight. He will then weigh himself daily and if his weight is up 3 pounds from dry weight, he will take lasix that day. Patient is otherwise improved symptomatically and stable for discharge today. -- will discharge with home PO Lasix 55m4may -- Fluid restrict 1.5L -- Strict I/Os -- Daily weights -- discharge home today  Symptomatic Anemia:  Secondary to multiple myeloma and chronic GI bleed requiring frequent transfusion. Received 1 transfusion on admission.  Hb 8.8 today, likely a result of getting him to a more euvolemic state with diuresis. Respiratory distress is likely multifactorial due to symptomatic anemia and CHF exacerbation. -- follow up with outpatient hematology  Multiple Myeloma:  With pancytopenia. Follows with oncology at [facility], scheduled for PET scan today at 12:30p. Patient states he still feels too weak and prefers to re-schedule the PET scan. Will plan on monitoring him overnight to see how he does with diuresis and allowing for patient to reschedule. -- Follow up at [facility] -- Continue home tramadol prn  Diabetes:  -- Hold home metformin -- SSI-sensitive TID w/ meals  Chronic Atrial  Flutter: With 4:1 block, rate controlled on home digoxin although levels are subtherapeutic. CHADS2-VASc score of at least 4 however not a candidate for anticoagulation due to chronic GI bleed. HR 72. -- Continue home digoxin 0.125 mg daily -- Telemetry  GERD: -- Continue home protonix 40 mg daily  Anxiety:  -- Continue home valium prn   HLD:  -- Continue home simvastatin 40 mg QHS -- Continue home fish oil supplements   BPH:  -- Continue home finasteride 5 mg daily   Constipation: KUB with nonobstructed gas pattern, consistent with constipation. Provided with sennokot. -- Continue home docusate BID prn  -- Continue sennokot as needed  Other: -- Continue home Carbidopa-levodopa   FEN: Fluid restrict 1.5L, replete lytes prn, heart healthy/carb mod VTE ppx: SCDs Code Status: FULL   Dispo: Anticipated discharge today   Colbert Ewing, MD 10/04/2016, 10:08 AM Pager: Mamie Nick [phone]

## 2016-10-04 NOTE — Discharge Instructions (Signed)
[name],  When you get home, weigh yourself on your home scale. Write that weight down and use that as your DRY WEIGHT. Then, weigh yourself every day in the morning. If your weight is 3 pounds or more than your dry weight, take 60mg  of lasix that day. If your weight is less than 3 pounds, you do not need to take lasix that day.  Please follow up with your hematologist as usual. Please call your PCP to schedule a follow up appointment.

## 2016-10-07 LAB — CULTURE, BLOOD (ROUTINE X 2)
CULTURE: NO GROWTH
Culture: NO GROWTH
Special Requests: ADEQUATE
Special Requests: ADEQUATE

## 2016-10-12 ENCOUNTER — Telehealth: Payer: Self-pay | Admitting: Cardiology

## 2016-10-12 NOTE — Telephone Encounter (Signed)
Received records from [facility] for appointment on 10/24/16 with Dr Stanford Breed.  Records put with Dr Jacalyn Lefevre schedule for 10/24/16. lp

## 2016-10-18 NOTE — Progress Notes (Signed)
HPI: FU AVR. Previous AVR in 2006 with a porcine valve. Echocardiogram 11/29/11: Moderate LVH, EF 81-01%, grade 1 diastolic dysfunction, critical aortic stenosis with a mean gradient of 80 mmHg. LHC 11/28/11: Normal coronary arteries, severe aortic stenosis. Patient underwent redo aortic valve replacement with a pericardial tissue valve 12/06/11 with Dr. Roxy Manns. Abdominal ultrasound January 2017 showed no aneurysm. Patient has had problems with GI blood loss and is being treated for multiple myeloma. He has required multiple blood transfusions. ATEE was performed on 10/27/2015 to see if hemolysis from his valve may be contributing. This revealed normal LV systolic function. There was a bioprosthetic aortic valve with elevated mean gradient of 31 mmHg and moderate AI that was paravalvular. There was mild mitral regurgitation, mild biatrial enlargement and mild right ventricular enlargement. I reviewed the patient with Dr. Roxy Manns. He would be high risk for redo aortic valve replacement giving his comorbidities and this would be his third valve replacement. I discussed the patient with Dr. Norma Fredrickson at [facility] who is a member of the hematology oncology department. He felt anemia was multifactorial including hemolysis, myeloma and predominantly GI blood loss. He was being evaluated for GI source and if hemolysis thought to be the major issue in the future we could consider referral to Dr. Albertine Patricia in [facility] closure of his paravalvular leak. 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  Patient found to be in atrial flutter at previous office visit. Echo 7/18 showed vigorous LV function, AVR with mild AI, mean gradient 18 mmHg, biatrial enlargement. Holter 7/18 showed atrial flutter rate controlled. CTA 8/18 showed no pulmonary embolus; CHF. Multiple recent admissions for CHF and anemia. Since last seen, he has some dyspnea on exertion but no orthopnea, PND, chest pain or  syncope. Minimal pedal edema. He is not taking his Lasix every day and is not following a low-sodium diet.  Current Outpatient Prescriptions  Medication Sig Dispense Refill  . acetaminophen (TYLENOL) 325 MG tablet Take 650 mg by mouth every 6 (six) hours as needed for fever.     . carbidopa-levodopa (SINEMET IR) 25-100 MG tablet Take 3 tablets by mouth 4 (four) times daily.     . cyanocobalamin 500 MCG tablet Take 500 mcg by mouth 2 (two) times daily. Lunch/dinner    . cyclobenzaprine (FLEXERIL) 10 MG tablet Take 5 mg by mouth 3 (three) times daily as needed for muscle spasms.    . diazepam (VALIUM) 2 MG tablet Take 2 mg by mouth at bedtime as needed for anxiety.     . digoxin (LANOXIN) 0.125 MG tablet Take 1 tablet (0.125 mg total) by mouth daily. 30 tablet 1  . docusate sodium (COLACE) 100 MG capsule Take 100 mg by mouth 2 (two) times daily as needed for moderate constipation.     . finasteride (PROSCAR) 5 MG tablet Take 5 mg by mouth daily.    . fish oil-omega-3 fatty acids 1000 MG capsule Take 1 g by mouth daily with lunch.     . folic acid (FOLVITE) 1 MG tablet Take 1 mg by mouth daily.    . furosemide (LASIX) 40 MG tablet Take 1.5 tablets (60 mg total) by mouth daily. 135 tablet 3  . gabapentin (NEURONTIN) 800 MG tablet Take 400-800 mg by mouth See admin instructions. Take 1/2 tablet every morning and at bedtime then take 1 tablet at lunch and dinner    . hydrocortisone (ANUSOL-HC) 2.5 % rectal cream Place 1 application  rectally 2 (two) times daily.    . metFORMIN (GLUCOPHAGE) 500 MG tablet Take 500 mg by mouth 2 (two) times daily with a meal.    . methylcellulose (ARTIFICIAL TEARS) 1 % ophthalmic solution Place 1 drop into both eyes at bedtime as needed (dry eyes).    . pantoprazole (PROTONIX) 40 MG tablet Take 1 tablet (40 mg total) by mouth daily. For GERD (Patient taking differently: Take 40 mg by mouth daily with lunch. For GERD) 30 tablet 3  . Polyvinyl Alcohol-Povidone PF (REFRESH)  1.4-0.6 % SOLN Place 1 drop into both eyes 2 (two) times daily as needed (dry eyes).    . pomalidomide (POMALYST) 4 MG capsule Take 4 mg by mouth See admin instructions. Take with water on days 1-21 then  PATIENT IS OFF MED FOR 7 DAYS, Repeat every 28 days.    . simvastatin (ZOCOR) 40 MG tablet Take 20 mg by mouth at bedtime.     . traMADol (ULTRAM) 50 MG tablet Take 50 mg by mouth every 6 (six) hours as needed for moderate pain.    Marland Kitchen UNABLE TO FIND Place 1 application into the right eye daily. Ocular Ointment     . zinc oxide 20 % ointment Apply 1 application topically 2 (two) times daily as needed for irritation.    Marland Kitchen dexamethasone (DECADRON) 4 MG tablet Take 10 mg by mouth every 7 (seven) days. days 1, 8, 15 and 22 of 28 day cycle.     No current facility-administered medications for this visit.      Past Medical History:  Diagnosis Date  . Allergic rhinitis   . Anemia 2009  . Aortic stenosis    a. s/p porcine AVR 2006;  b. s/p redo tissue AVR 12/2011 (Dr. Roxy Manns) - preAVR LHC with no CAD  . Asthma   . BPH (benign prostatic hypertrophy)   . Cancer ([place])    multiple myeloma  . Cataract   . Chronic cough   . Chronic interstitial cystitis   . Depression    pt denies  . Diabetes mellitus    type 2  . Diabetes mellitus without complication ([place])   . Diverticulosis of colon    on colonoscopy 2008  . GERD (gastroesophageal reflux disease)    bravo pH study 2008  . Gout   . H/O aortic valve replacement with porcine valve    2006, 2013  . Headache(784.0)   . Hemorrhoids    external and internal  . Hiatal hernia   . HTN (hypertension)   . Hx of echocardiogram    a. Echo  (post AVR) 12/2011:  mod LVH, EF 60-65%, Gr 2 diast dysfn, mild AI, AVR ok (mean gradient 19 mmHg), MAC, mild BAE  . Hyperlipidemia   . Hypertension   . Hyperthyroidism   . IBS (irritable bowel syndrome)   . Insomnia   . Lower GI bleed 07/16/2015  . OA (osteoarthritis)   . OSA (obstructive sleep apnea)     USES CPAP AS NEEDED  . 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 Periodontitis    chronic with bone loss  . Restless leg syndrome   . S/P aortic valve replacement with bioprosthetic valve 12/06/2011   Redo AVR using 23 mm [facility] Ease pericardial tissue valve    Past Surgical History:  Procedure Laterality Date  . AORTA - FEMORAL ARTERY BYPASS GRAFT    . AORTIC VALVE REPLACEMENT  10/13/2004   73m Edwards Perimount pericardial tissue valve  .  AORTIC VALVE REPLACEMENT  12/06/2011   Procedure: REDO AORTIC VALVE REPLACEMENT (AVR);  Surgeon: Rexene Alberts, MD;  Location: [facility];  Service: Open Heart Surgery;  Laterality: N/A;  . BUNIONECTOMY     right  . CARDIAC SURGERY     aorta vavle replacement  . CATARACT EXTRACTION  2009    &   2012   BILATERAL  . COLONOSCOPY  08/31/2011   Procedure: COLONOSCOPY;  Surgeon: Inda Castle, MD;  Location: [facility];  Service: Endoscopy;  Laterality: N/A;  . ESOPHAGOGASTRODUODENOSCOPY  08/30/2011   Procedure: ESOPHAGOGASTRODUODENOSCOPY (EGD);  Surgeon: Inda Castle, MD;  Location: [facility];  Service: Endoscopy;  Laterality: N/A;  Rm 3005   . HERNIA REPAIR    . JOINT REPLACEMENT     knee  . LEFT HEART CATHETERIZATION WITH CORONARY ANGIOGRAM N/A 11/30/2011   Procedure: LEFT HEART CATHETERIZATION WITH CORONARY ANGIOGRAM;  Surgeon: Burnell Blanks, MD;  Location: [facility] CATH LAB;  Service: Cardiovascular;  Laterality: N/A;  . NASAL SEPTOPLASTY W/ TURBINOPLASTY    . REFRACTIVE SURGERY     bilateral  . RIGHT HEART CATHETERIZATION Bilateral 11/30/2011   Procedure: RIGHT HEART CATH;  Surgeon: Burnell Blanks, MD;  Location: [facility] CATH LAB;  Service: Cardiovascular;  Laterality: Bilateral;  . right knee arthroscopy    . TEE WITHOUT CARDIOVERSION N/A 10/27/2015   Procedure: TRANSESOPHAGEAL ECHOCARDIOGRAM (TEE);  Surgeon: Lelon Perla, MD;  Location: [facility] ENDOSCOPY;  Service: Cardiovascular;  Laterality: N/A;     Social History   Social History  . Marital status: Married    Spouse name: N/A  . Number of children: 0  . Years of education: N/A   Occupational History  . retired    Social History Main Topics  . Smoking status: Former Smoker    Quit date: 09/30/1971  . Smokeless tobacco: Never Used     Comment: Quit August 973  . Alcohol use No  . Drug use: No  . Sexual activity: Not Currently   Other Topics Concern  . Not on file   Social History Narrative   ** Merged History Encounter **        Family History  Problem Relation Age of Onset  . Heart disease Father   . Osteoarthritis Mother   . Hypertension Sister   . Hyperlipidemia Unknown   . Cancer Maternal Aunt     ROS: no fevers or chills, productive cough, hemoptysis, dysphasia, odynophagia, melena, hematochezia, dysuria, hematuria, rash, seizure activity, orthopnea, PND, claudication. Remaining systems are negative.  Physical Exam: Well-developed chronically ill appearing in no acute distress.  Skin is warm and dry.  HEENT is normal.  Neck is supple.  Chest is clear to auscultation with normal expansion.  Cardiovascular exam is regular rate and rhythm. 3/6 systolic murmur Abdominal exam nontender or distended. No masses palpated. Extremities show 1+ edema. neuro grossly intact   A/P  1 Atrial flutter-Continue digoxin for rate control. He has orthostatic symptoms and I am therefore hesitant to add a calcium blocker or beta blocker. He is not a candidate for anticoagulation given severe anemia/GI blood loss. We therefore cannot pursue cardioversion.  2 acute on chronic diastolic congestive heart failure-long discussion today concerning congestive heart failure. He is not taking his Lasix as prescribed. He is not following a low sodium diet. I explained that he must take his Lasix daily. We discussed the importance of fluid restriction to 1.5 L daily. I discussed the importance of low sodium diet. He  will weigh daily  and take additional 20 mg of Lasix for weight gain of 2 pounds.  3 status post aortic valve replacement-plan to continue SBE prophylaxis. Most recent echocardiogram showed mild aortic stenosis and mild aortic insufficiency. However previous transesophageal echocardiogram showed moderate aortic insufficiency. This was felt to potentially be contributing to his anemia through hemolysis but hematology feels the predominant cause is blood loss anemia from his GI tract and myeloma. If hemolysis was felt to be a significant contributor in the future we could consider closure of his paravalvular leak.  4 anemia-this is felt to be multifactorial but predominantly GI blood loss. He is followed by hematology oncology.  5 multiple myeloma-management per oncology.  6 hyperlipidemia-continue statin.    Kirk Ruths, MD

## 2016-10-24 ENCOUNTER — Encounter: Payer: Self-pay | Admitting: Cardiology

## 2016-10-24 ENCOUNTER — Ambulatory Visit (INDEPENDENT_AMBULATORY_CARE_PROVIDER_SITE_OTHER): Payer: Medicare Other | Admitting: Cardiology

## 2016-10-24 VITALS — BP 108/66 | HR 82 | Ht 69.0 in | Wt 168.0 lb

## 2016-10-24 DIAGNOSIS — I5033 Acute on chronic diastolic (congestive) heart failure: Secondary | ICD-10-CM

## 2016-10-24 DIAGNOSIS — I483 Typical atrial flutter: Secondary | ICD-10-CM | POA: Diagnosis not present

## 2016-10-24 DIAGNOSIS — E7800 Pure hypercholesterolemia, unspecified: Secondary | ICD-10-CM

## 2016-10-24 DIAGNOSIS — Z952 Presence of prosthetic heart valve: Secondary | ICD-10-CM

## 2016-10-24 NOTE — Patient Instructions (Addendum)
Your physician recommends that you schedule a follow-up appointment in: [place] [facility]   Your physician recommends that you schedule a follow-up appointment in: [place]   Low-Sodium Eating Plan Sodium, which is an element that makes up salt, helps you maintain a healthy balance of fluids in your body. Too much sodium can increase your blood pressure and cause fluid and waste to be held in your body. Your health care provider or dietitian may recommend following this plan if you have high blood pressure (hypertension), kidney disease, liver disease, or heart failure. Eating less sodium can help lower your blood pressure, reduce swelling, and protect your heart, liver, and kidneys. What are tips for following this plan? General guidelines  Most people on this plan should limit their sodium intake to 1,500-2,000 mg (milligrams) of sodium each day. Reading food labels  The Nutrition Facts label lists the amount of sodium in one serving of the food. If you eat more than one serving, you must multiply the listed amount of sodium by the number of servings.  Choose foods with less than 140 mg of sodium per serving.  Avoid foods with 300 mg of sodium or more per serving. Shopping  Look for lower-sodium products, often labeled as "low-sodium" or "no salt added."  Always check the sodium content even if foods are labeled as "unsalted" or "no salt added".  Buy fresh foods. ? Avoid canned foods and premade or frozen meals. ? Avoid canned, cured, or processed meats  Buy breads that have less than 80 mg of sodium per slice. Cooking  Eat more home-cooked food and less restaurant, buffet, and fast food.  Avoid adding salt when cooking. Use salt-free seasonings or herbs instead of table salt or sea salt. Check with your health care provider or pharmacist before using salt substitutes.  Cook with plant-based oils, such as canola, sunflower, or olive oil. Meal  planning  When eating at a restaurant, ask that your food be prepared with less salt or no salt, if possible.  Avoid foods that contain MSG (monosodium glutamate). MSG is sometimes added to [place] food, bouillon, and some canned foods. What foods are recommended? The items listed may not be a complete list. Talk with your dietitian about what dietary choices are best for you. Grains Low-sodium cereals, including oats, puffed wheat and rice, and shredded wheat. Low-sodium crackers. Unsalted rice. Unsalted pasta. Low-sodium bread. Whole-grain breads and whole-grain pasta. Vegetables Fresh or frozen vegetables. "No salt added" canned vegetables. "No salt added" tomato sauce and paste. Low-sodium or reduced-sodium tomato and vegetable juice. Fruits Fresh, frozen, or canned fruit. Fruit juice. Meats and other protein foods Fresh or frozen (no salt added) meat, poultry, seafood, and fish. Low-sodium canned tuna and salmon. Unsalted nuts. Dried peas, beans, and lentils without added salt. Unsalted canned beans. Eggs. Unsalted nut butters. Dairy Milk. Soy milk. Cheese that is naturally low in sodium, such as ricotta cheese, fresh mozzarella, or Swiss cheese Low-sodium or reduced-sodium cheese. Cream cheese. Yogurt. Fats and oils Unsalted butter. Unsalted margarine with no trans fat. Vegetable oils such as canola or olive oils. Seasonings and other foods Fresh and dried herbs and spices. Salt-free seasonings. Low-sodium mustard and ketchup. Sodium-free salad dressing. Sodium-free light mayonnaise. Fresh or refrigerated horseradish. Lemon juice. Vinegar. Homemade, reduced-sodium, or low-sodium soups. Unsalted popcorn and pretzels. Low-salt or salt-free chips. What foods are not recommended? The items listed may not be a complete list. Talk with your dietitian about  what dietary choices are best for you. Grains Instant hot cereals. Bread stuffing, pancake, and biscuit mixes. Croutons. Seasoned rice or  pasta mixes. Noodle soup cups. Boxed or frozen macaroni and cheese. Regular salted crackers. Self-rising flour. Vegetables Sauerkraut, pickled vegetables, and relishes. Olives. [place] fries. Onion rings. Regular canned vegetables (not low-sodium or reduced-sodium). Regular canned tomato sauce and paste (not low-sodium or reduced-sodium). Regular tomato and vegetable juice (not low-sodium or reduced-sodium). Frozen vegetables in sauces. Meats and other protein foods Meat or fish that is salted, canned, smoked, spiced, or pickled. Bacon, ham, sausage, hotdogs, corned beef, chipped beef, packaged lunch meats, salt pork, jerky, pickled herring, anchovies, regular canned tuna, sardines, salted nuts. Dairy Processed cheese and cheese spreads. Cheese curds. Blue cheese. Feta cheese. String cheese. Regular cottage cheese. Buttermilk. Canned milk. Fats and oils Salted butter. Regular margarine. Ghee. Bacon fat. Seasonings and other foods Onion salt, garlic salt, seasoned salt, table salt, and sea salt. Canned and packaged gravies. Worcestershire sauce. Tartar sauce. Barbecue sauce. Teriyaki sauce. Soy sauce, including reduced-sodium. Steak sauce. Fish sauce. Oyster sauce. Cocktail sauce. Horseradish that you find on the shelf. Regular ketchup and mustard. Meat flavorings and tenderizers. Bouillon cubes. Hot sauce and Tabasco sauce. Premade or packaged marinades. Premade or packaged taco seasonings. Relishes. Regular salad dressings. Salsa. Potato and tortilla chips. Corn chips and puffs. Salted popcorn and pretzels. Canned or dried soups. Pizza. Frozen entrees and pot pies. Summary  Eating less sodium can help lower your blood pressure, reduce swelling, and protect your heart, liver, and kidneys.  Most people on this plan should limit their sodium intake to 1,500-2,000 mg (milligrams) of sodium each day.  Canned, boxed, and frozen foods are high in sodium. Restaurant foods, fast foods, and pizza are also  very high in sodium. You also get sodium by adding salt to food.  Try to cook at home, eat more fresh fruits and vegetables, and eat less fast food, canned, processed, or prepared foods. This information is not intended to replace advice given to you by your health care provider. Make sure you discuss any questions you have with your health care provider. Document Released: 07/16/2001 Document Revised: 01/18/2016 Document Reviewed: 01/18/2016 Elsevier Interactive Patient Education  2017 [employer].

## 2016-11-09 ENCOUNTER — Telehealth: Payer: Self-pay | Admitting: Physician Assistant

## 2016-11-09 NOTE — Telephone Encounter (Signed)
Received records from [facility] for appointment on 11/21/16 with Janan Ridge, PA.  Records put with Hao's schedule for 11/21/16. lp

## 2016-11-18 ENCOUNTER — Observation Stay (HOSPITAL_COMMUNITY)
Admission: EM | Admit: 2016-11-18 | Discharge: 2016-11-19 | Disposition: A | Discharge status: Home / Self Care | Payer: Medicare Other | Attending: Nephrology | Admitting: Nephrology

## 2016-11-18 ENCOUNTER — Encounter (HOSPITAL_COMMUNITY): Payer: Self-pay | Admitting: Emergency Medicine

## 2016-11-18 DIAGNOSIS — M1990 Unspecified osteoarthritis, unspecified site: Secondary | ICD-10-CM | POA: Diagnosis not present

## 2016-11-18 DIAGNOSIS — D500 Iron deficiency anemia secondary to blood loss (chronic): Secondary | ICD-10-CM | POA: Insufficient documentation

## 2016-11-18 DIAGNOSIS — I4892 Unspecified atrial flutter: Secondary | ICD-10-CM | POA: Diagnosis not present

## 2016-11-18 DIAGNOSIS — G2581 Restless legs syndrome: Secondary | ICD-10-CM | POA: Diagnosis not present

## 2016-11-18 DIAGNOSIS — Z791 Long term (current) use of non-steroidal anti-inflammatories (NSAID): Secondary | ICD-10-CM | POA: Insufficient documentation

## 2016-11-18 DIAGNOSIS — R0602 Shortness of breath: Secondary | ICD-10-CM | POA: Diagnosis not present

## 2016-11-18 DIAGNOSIS — I4891 Unspecified atrial fibrillation: Secondary | ICD-10-CM | POA: Insufficient documentation

## 2016-11-18 DIAGNOSIS — R0603 Acute respiratory distress: Secondary | ICD-10-CM | POA: Diagnosis not present

## 2016-11-18 DIAGNOSIS — M109 Gout, unspecified: Secondary | ICD-10-CM | POA: Insufficient documentation

## 2016-11-18 DIAGNOSIS — C9000 Multiple myeloma not having achieved remission: Secondary | ICD-10-CM | POA: Diagnosis present

## 2016-11-18 DIAGNOSIS — E0590 Thyrotoxicosis, unspecified without thyrotoxic crisis or storm: Secondary | ICD-10-CM | POA: Diagnosis not present

## 2016-11-18 DIAGNOSIS — G4733 Obstructive sleep apnea (adult) (pediatric): Secondary | ICD-10-CM | POA: Insufficient documentation

## 2016-11-18 DIAGNOSIS — Z952 Presence of prosthetic heart valve: Secondary | ICD-10-CM | POA: Insufficient documentation

## 2016-11-18 DIAGNOSIS — Z79899 Other long term (current) drug therapy: Secondary | ICD-10-CM | POA: Insufficient documentation

## 2016-11-18 DIAGNOSIS — Z888 Allergy status to other drugs, medicaments and biological substances status: Secondary | ICD-10-CM | POA: Insufficient documentation

## 2016-11-18 DIAGNOSIS — K219 Gastro-esophageal reflux disease without esophagitis: Secondary | ICD-10-CM | POA: Insufficient documentation

## 2016-11-18 DIAGNOSIS — E785 Hyperlipidemia, unspecified: Secondary | ICD-10-CM | POA: Insufficient documentation

## 2016-11-18 DIAGNOSIS — Z7984 Long term (current) use of oral hypoglycemic drugs: Secondary | ICD-10-CM | POA: Diagnosis not present

## 2016-11-18 DIAGNOSIS — K589 Irritable bowel syndrome without diarrhea: Secondary | ICD-10-CM | POA: Insufficient documentation

## 2016-11-18 DIAGNOSIS — F329 Major depressive disorder, single episode, unspecified: Secondary | ICD-10-CM | POA: Diagnosis not present

## 2016-11-18 DIAGNOSIS — Z87891 Personal history of nicotine dependence: Secondary | ICD-10-CM | POA: Insufficient documentation

## 2016-11-18 DIAGNOSIS — I5033 Acute on chronic diastolic (congestive) heart failure: Secondary | ICD-10-CM | POA: Insufficient documentation

## 2016-11-18 DIAGNOSIS — J811 Chronic pulmonary edema: Secondary | ICD-10-CM | POA: Diagnosis present

## 2016-11-18 DIAGNOSIS — E119 Type 2 diabetes mellitus without complications: Secondary | ICD-10-CM | POA: Insufficient documentation

## 2016-11-18 DIAGNOSIS — I110 Hypertensive heart disease with heart failure: Secondary | ICD-10-CM | POA: Insufficient documentation

## 2016-11-18 DIAGNOSIS — E118 Type 2 diabetes mellitus with unspecified complications: Secondary | ICD-10-CM | POA: Diagnosis present

## 2016-11-18 DIAGNOSIS — Z953 Presence of xenogenic heart valve: Secondary | ICD-10-CM

## 2016-11-18 DIAGNOSIS — R069 Unspecified abnormalities of breathing: Secondary | ICD-10-CM | POA: Diagnosis not present

## 2016-11-18 DIAGNOSIS — K922 Gastrointestinal hemorrhage, unspecified: Secondary | ICD-10-CM | POA: Diagnosis present

## 2016-11-18 DIAGNOSIS — N400 Enlarged prostate without lower urinary tract symptoms: Secondary | ICD-10-CM | POA: Insufficient documentation

## 2016-11-18 LAB — I-STAT TROPONIN, ED: Troponin i, poc: 0.01 ng/mL (ref 0.00–0.08)

## 2016-11-18 NOTE — ED Triage Notes (Signed)
Per EMS this pt called EMS to because of SOB,  Initially he was placed on a NRB and the subsequently taken off due to symptoms an VS improving.  EMS attempted to start and IV and pushed 125 solumedrol and then realized it blew after pushing that medication.  Pt states feeling better and having no SOB currently and wanting to go home.

## 2016-11-18 NOTE — ED Notes (Signed)
Patient transported to X-ray 
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
## 2016-11-19 ENCOUNTER — Emergency Department (HOSPITAL_COMMUNITY): Payer: Medicare Other

## 2016-11-19 DIAGNOSIS — C9000 Multiple myeloma not having achieved remission: Secondary | ICD-10-CM | POA: Diagnosis not present

## 2016-11-19 DIAGNOSIS — R0602 Shortness of breath: Secondary | ICD-10-CM | POA: Diagnosis not present

## 2016-11-19 DIAGNOSIS — K922 Gastrointestinal hemorrhage, unspecified: Secondary | ICD-10-CM

## 2016-11-19 DIAGNOSIS — Z953 Presence of xenogenic heart valve: Secondary | ICD-10-CM | POA: Diagnosis not present

## 2016-11-19 DIAGNOSIS — R0603 Acute respiratory distress: Secondary | ICD-10-CM | POA: Diagnosis present

## 2016-11-19 DIAGNOSIS — I4892 Unspecified atrial flutter: Secondary | ICD-10-CM | POA: Diagnosis not present

## 2016-11-19 DIAGNOSIS — E118 Type 2 diabetes mellitus with unspecified complications: Secondary | ICD-10-CM

## 2016-11-19 DIAGNOSIS — I5033 Acute on chronic diastolic (congestive) heart failure: Secondary | ICD-10-CM

## 2016-11-19 DIAGNOSIS — J810 Acute pulmonary edema: Secondary | ICD-10-CM | POA: Diagnosis not present

## 2016-11-19 LAB — CBC WITH DIFFERENTIAL/PLATELET
BASOS ABS: 0 10*3/uL (ref 0.0–0.1)
Basophils Relative: 0 %
Eosinophils Absolute: 0 10*3/uL (ref 0.0–0.7)
Eosinophils Relative: 1 %
HEMATOCRIT: 28.2 % — AB (ref 39.0–52.0)
HEMOGLOBIN: 8.6 g/dL — AB (ref 13.0–17.0)
LYMPHS PCT: 10 %
Lymphs Abs: 0.4 10*3/uL — ABNORMAL LOW (ref 0.7–4.0)
MCH: 28.1 pg (ref 26.0–34.0)
MCHC: 30.5 g/dL (ref 30.0–36.0)
MCV: 92.2 fL (ref 78.0–100.0)
MONOS PCT: 5 %
Monocytes Absolute: 0.2 10*3/uL (ref 0.1–1.0)
Neutro Abs: 3.4 10*3/uL (ref 1.7–7.7)
Neutrophils Relative %: 84 %
Platelets: 64 10*3/uL — ABNORMAL LOW (ref 150–400)
RBC: 3.06 MIL/uL — AB (ref 4.22–5.81)
RDW: 21.2 % — ABNORMAL HIGH (ref 11.5–15.5)
WBC: 4 10*3/uL (ref 4.0–10.5)

## 2016-11-19 LAB — BRAIN NATRIURETIC PEPTIDE: B NATRIURETIC PEPTIDE 5: 268.9 pg/mL — AB (ref 0.0–100.0)

## 2016-11-19 LAB — COMPREHENSIVE METABOLIC PANEL
ALK PHOS: 142 U/L — AB (ref 38–126)
ALT: 11 U/L — AB (ref 17–63)
AST: 70 U/L — ABNORMAL HIGH (ref 15–41)
Albumin: 3.8 g/dL (ref 3.5–5.0)
Anion gap: 8 (ref 5–15)
BILIRUBIN TOTAL: 3.6 mg/dL — AB (ref 0.3–1.2)
BUN: 8 mg/dL (ref 6–20)
CALCIUM: 9.8 mg/dL (ref 8.9–10.3)
CO2: 25 mmol/L (ref 22–32)
CREATININE: 0.5 mg/dL — AB (ref 0.61–1.24)
Chloride: 101 mmol/L (ref 101–111)
Glucose, Bld: 136 mg/dL — ABNORMAL HIGH (ref 65–99)
Potassium: 3.5 mmol/L (ref 3.5–5.1)
SODIUM: 134 mmol/L — AB (ref 135–145)
TOTAL PROTEIN: 7 g/dL (ref 6.5–8.1)

## 2016-11-19 LAB — GLUCOSE, CAPILLARY
Glucose-Capillary: 123 mg/dL — ABNORMAL HIGH (ref 65–99)
Glucose-Capillary: 127 mg/dL — ABNORMAL HIGH (ref 65–99)

## 2016-11-19 LAB — DIGOXIN LEVEL: DIGOXIN LVL: 0.2 ng/mL — AB (ref 0.8–2.0)

## 2016-11-19 LAB — TROPONIN I: Troponin I: <0.03 ng/mL (ref <0.03)

## 2016-11-19 MED ORDER — FUROSEMIDE 10 MG/ML IJ SOLN
60.0000 mg | Freq: Once | INTRAMUSCULAR | Status: AC
  Administered 2016-11-19: 60 mg via INTRAVENOUS
  Filled 2016-11-19: qty 6

## 2016-11-19 MED ORDER — DIAZEPAM 2 MG PO TABS: 2.0000 mg | ORAL_TABLET | Freq: Every evening | ORAL | Status: DC | PRN

## 2016-11-19 MED ORDER — INSULIN ASPART 100 UNIT/ML ~~LOC~~ SOLN: 0.0000 [IU] | Freq: Every day | SUBCUTANEOUS | Status: DC

## 2016-11-19 MED ORDER — SIMVASTATIN 20 MG PO TABS: 20.0000 mg | ORAL_TABLET | Freq: Every day | ORAL | Status: DC

## 2016-11-19 MED ORDER — ONDANSETRON HCL 4 MG/2ML IJ SOLN
4.0000 mg | Freq: Four times a day (QID) | INTRAMUSCULAR | Status: DC | PRN
Start: 2016-11-19 — End: 2016-11-19

## 2016-11-19 MED ORDER — INSULIN ASPART 100 UNIT/ML ~~LOC~~ SOLN: 0.0000 [IU] | Freq: Three times a day (TID) | SUBCUTANEOUS | Status: DC

## 2016-11-19 MED ORDER — POLYVINYL ALCOHOL 1.4 % OP SOLN: 1.0000 [drp] | Freq: Two times a day (BID) | OPHTHALMIC | Status: DC | PRN

## 2016-11-19 MED ORDER — ACYCLOVIR 200 MG PO CAPS
200.0000 mg | ORAL_CAPSULE | Freq: Two times a day (BID) | ORAL | Status: DC
  Administered 2016-11-19: 200 mg via ORAL
  Filled 2016-11-19: qty 1

## 2016-11-19 MED ORDER — SODIUM CHLORIDE 0.9% FLUSH: 3.0000 mL | INTRAVENOUS | Status: DC | PRN

## 2016-11-19 MED ORDER — CARBIDOPA-LEVODOPA 25-100 MG PO TABS
3.0000 | ORAL_TABLET | Freq: Four times a day (QID) | ORAL | Status: DC
  Administered 2016-11-19 (×2): 3 via ORAL
  Filled 2016-11-19 (×2): qty 3

## 2016-11-19 MED ORDER — SODIUM CHLORIDE 0.9% FLUSH
3.0000 mL | Freq: Two times a day (BID) | INTRAVENOUS | Status: DC
  Administered 2016-11-19: 3 mL via INTRAVENOUS

## 2016-11-19 MED ORDER — FUROSEMIDE 20 MG PO TABS
60.0000 mg | ORAL_TABLET | Freq: Every day | ORAL | Status: DC
  Administered 2016-11-19: 60 mg via ORAL
  Filled 2016-11-19: qty 1

## 2016-11-19 MED ORDER — FINASTERIDE 5 MG PO TABS
5.0000 mg | ORAL_TABLET | Freq: Every day | ORAL | Status: DC
  Administered 2016-11-19: 5 mg via ORAL
  Filled 2016-11-19: qty 1

## 2016-11-19 MED ORDER — TRAMADOL HCL 50 MG PO TABS: 50.0000 mg | ORAL_TABLET | Freq: Four times a day (QID) | ORAL | Status: DC | PRN

## 2016-11-19 MED ORDER — SODIUM CHLORIDE 0.9 % IV SOLN: 250.0000 mL | INTRAVENOUS | Status: DC | PRN

## 2016-11-19 MED ORDER — DOCUSATE SODIUM 100 MG PO CAPS: 100.0000 mg | ORAL_CAPSULE | Freq: Two times a day (BID) | ORAL | Status: DC | PRN

## 2016-11-19 MED ORDER — GABAPENTIN 400 MG PO CAPS: 400.0000 mg | ORAL_CAPSULE | Freq: Two times a day (BID) | ORAL | Status: DC

## 2016-11-19 MED ORDER — GABAPENTIN 800 MG PO TABS: 400.0000 mg | ORAL_TABLET | ORAL | Status: DC

## 2016-11-19 MED ORDER — PANTOPRAZOLE SODIUM 40 MG PO TBEC: 40.0000 mg | DELAYED_RELEASE_TABLET | Freq: Every day | ORAL | Status: DC

## 2016-11-19 MED ORDER — DIGOXIN 125 MCG PO TABS
0.1250 mg | ORAL_TABLET | Freq: Every day | ORAL | Status: DC
  Administered 2016-11-19: 0.125 mg via ORAL
  Filled 2016-11-19: qty 1

## 2016-11-19 MED ORDER — GABAPENTIN 400 MG PO CAPS: 800.0000 mg | ORAL_CAPSULE | Freq: Two times a day (BID) | ORAL | Status: DC

## 2016-11-19 MED ORDER — ACETAMINOPHEN 325 MG PO TABS: 650.0000 mg | ORAL_TABLET | ORAL | Status: DC | PRN

## 2016-11-19 NOTE — ED Provider Notes (Signed)
Medical screening examination/treatment/procedure(s) were conducted as a shared visit with non-physician practitioner(s) and myself.  I personally evaluated the patient during the encounter.   EKG Interpretation  Date/Time:  Saturday November 19 2016 00:48:16 EDT Ventricular Rate:  96 PR Interval:    QRS Duration: 92 QT Interval:  403 QTC Calculation: 536 R Axis:   -10 Text Interpretation:  wandering atrial pacemaker Prolonged QT interval Confirmed by Randal Buba, Ares Cardozo ([id number]) on 11/19/2016 1:00:49 AM     NCAT RRR diminished BS B Abdomen is soft and non tender  1+ edema of BLE   Will admit    Christol Thetford, MD 11/19/16 [id number]

## 2016-11-19 NOTE — Discharge Summary (Signed)
Physician Discharge Summary  [name] MGN:[id number] DOB: 11-28-1945 DOA: 11/18/2016  PCP: [facility]  Admit date: 11/18/2016 Discharge date: 11/19/2016  Admitted From:home Disposition:home  Recommendations for Outpatient Follow-up:  1. Follow up with PCP in 1-2 weeks 2. Please obtain BMP/CBC in one week  Home Health:no Equipment/Devices:no Discharge Condition:stable CODE STATUS:full code Diet recommendation:heart healthy  Brief/Interim Summary: 71 y.o. male with medical history significant of HTN, HLD, DM, MM on chemotherapy, AS s/p AVR, PAF, AVMs with chronic GI blood loss, CHF; who presents with complaints of acute onset of shortness of breath. Patient is currently on chemotherapy for multiple myeloma and received blood transfusion yesterday at [facility] follows GI and oncologist at [facility]. During admission he was found to have acute pulmonary edema likely contributed by blood transfusion. He received IV Lasix with significant clinical improvement. Today he denied headache, dizziness, nausea, vomiting, chest pain, shortness of breath. He feels back to normal. Resumed home dose of Lasix. The patient reported that he has follow-up with his cardiologist Dr. Stanford Breed on Monday ( after 2 days) and feels like he can go home today. Patient follows at outside hospital for the management of anemia and thrombocytopenia. I think he is medically stable to go home today with outpatient follow-up. Resume home medication. I've discussed this with the patient's wife and his sister at bedside.  Lung exam clear. He has no edema.  Discharge Diagnoses:  Principal Problem:   Acute respiratory distress Active Problems:   S/P aortic valve replacement with bioprosthetic valve   Multiple myeloma not having achieved remission (HCC)   Acute on chronic diastolic congestive heart failure (HCC)   Type II diabetes mellitus with complication (HCC)   Pulmonary edema   Atrial flutter [facility])      Discharge Instructions  Discharge Instructions    (HEART FAILURE PATIENTS) Call MD:  Anytime you have any of the following symptoms: 1) 3 pound weight gain in 24 hours or 5 pounds in 1 week 2) shortness of breath, with or without a dry hacking cough 3) swelling in the hands, feet or stomach 4) if you have to sleep on extra pillows at night in order to breathe.    Complete by:  As directed    Call MD for:  difficulty breathing, headache or visual disturbances    Complete by:  As directed    Call MD for:  extreme fatigue    Complete by:  As directed    Call MD for:  hives    Complete by:  As directed    Call MD for:  persistant dizziness or light-headedness    Complete by:  As directed    Call MD for:  persistant nausea and vomiting    Complete by:  As directed    Call MD for:  severe uncontrolled pain    Complete by:  As directed    Call MD for:  temperature >100.4    Complete by:  As directed    Diet - low sodium heart healthy    Complete by:  As directed    Discharge instructions    Complete by:  As directed    Please follow up with your oncologist, GI, Cardiology and PCP as outpatient. Recommend to check lab on Monday when you see Cardiology.   Increase activity slowly    Complete by:  As directed      Allergies as of 11/19/2016      Reactions   Avodart [dutasteride] Other (See Comments)  Lose of use of arms and legs   Spironolactone Other (See Comments)   Low tolerance    Codeine Other (See Comments)   GI upset in large doses   Diazepam Other (See Comments)   Mood changes   Doxazosin Other (See Comments)   Dizziness   Feraheme [ferumoxytol] Swelling   Pedal edema      Medication List    TAKE these medications   acetaminophen 325 MG tablet Commonly known as:  TYLENOL Take 650 mg by mouth every 6 (six) hours as needed for fever.   acyclovir 200 MG capsule Commonly known as:  ZOVIRAX Take 200 mg by mouth 2 (two) times daily.   carbidopa-levodopa 25-100  MG tablet Commonly known as:  SINEMET IR Take 3 tablets by mouth 4 (four) times daily.   cyanocobalamin 500 MCG tablet Take 500 mcg by mouth 2 (two) times daily. Lunch/dinner   cyclobenzaprine 10 MG tablet Commonly known as:  FLEXERIL Take 5 mg by mouth 3 (three) times daily as needed for muscle spasms.   dexamethasone 4 MG tablet Commonly known as:  DECADRON Take 10 mg by mouth 2 (two) times a week. For 3 weeks then off a week   diazepam 2 MG tablet Commonly known as:  VALIUM Take 2 mg by mouth at bedtime as needed for anxiety.   digoxin 0.125 MG tablet Commonly known as:  LANOXIN Take 1 tablet (0.125 mg total) by mouth daily.   docusate sodium 100 MG capsule Commonly known as:  COLACE Take 100 mg by mouth 2 (two) times daily as needed for moderate constipation.   finasteride 5 MG tablet Commonly known as:  PROSCAR Take 5 mg by mouth daily.   fish oil-omega-3 fatty acids 1000 MG capsule Take 1 g by mouth daily with lunch.   folic acid 1 MG tablet Commonly known as:  FOLVITE Take 1 mg by mouth daily.   furosemide 40 MG tablet Commonly known as:  LASIX Take 1.5 tablets (60 mg total) by mouth daily.   gabapentin 800 MG tablet Commonly known as:  NEURONTIN Take 400-800 mg by mouth See admin instructions. Take 1/2 tablet every morning and at bedtime then take 1 tablet at lunch and dinner   hydrocortisone 2.5 % rectal cream Commonly known as:  ANUSOL-HC Place 1 application rectally 2 (two) times daily as needed for hemorrhoids.   ixazomib citrate 3 MG capsule Commonly known as:  NINLARO Take 3 mg by mouth once a week. Off on days 1, 8 and 15 of a 28 day cycle. Take on an empty stomach 1hr before or 2hrs after food. Do not crush, chew, or open.   metFORMIN 500 MG tablet Commonly known as:  GLUCOPHAGE Take 500 mg by mouth 2 (two) times daily with a meal.   methylcellulose 1 % ophthalmic solution Commonly known as:  ARTIFICIAL TEARS Place 1 drop into both eyes at  bedtime as needed (dry eyes).   ondansetron 4 MG tablet Commonly known as:  ZOFRAN Take 4 mg by mouth every 8 (eight) hours as needed for nausea or vomiting.   pantoprazole 40 MG tablet Commonly known as:  PROTONIX Take 1 tablet (40 mg total) by mouth daily. For GERD What changed:  when to take this  additional instructions   REFRESH 1.4-0.6 % Soln Generic drug:  Polyvinyl Alcohol-Povidone PF Place 1 drop into both eyes 2 (two) times daily as needed (dry eyes).   simvastatin 40 MG tablet Commonly known as:  ZOCOR Take  20 mg by mouth at bedtime.   traMADol 50 MG tablet Commonly known as:  ULTRAM Take 50 mg by mouth every 6 (six) hours as needed for moderate pain.   UNABLE TO FIND Place 1 application into the right eye daily as needed (eye care). Ocular Ointment   zinc oxide 20 % ointment Apply 1 application topically 2 (two) times daily as needed for irritation.      Follow-up Information    [facility], [facility]. Schedule an appointment as soon as possible for a visit in 1 week(s).   Contact information: [place] [address] [phone]          Allergies  Allergen Reactions  . Avodart [Dutasteride] Other (See Comments)    Lose of use of arms and legs   . Spironolactone Other (See Comments)    Low tolerance   . Codeine Other (See Comments)    GI upset in large doses  . Diazepam Other (See Comments)    Mood changes   . Doxazosin Other (See Comments)    Dizziness   . Feraheme [Ferumoxytol] Swelling    Pedal edema    Consultations: none  Procedures/Studies:  none Subjective: Seen and examined at bedside. Reported feeling much better. Denied headache, dizziness, nausea vomiting chest pain or shortness of breath. Feels good and wanted to go home. He has follow-up appointment with his cardiologist and Monday which is after 2 days.  Discharge Exam: Vitals:   11/19/16 0400 11/19/16 0543  BP: 133/78 128/65   Pulse: 97 100  Resp: 14 18  Temp:  97.7 F (36.5 C)  SpO2: 97% 98%   Vitals:   11/19/16 0330 11/19/16 0345 11/19/16 0400 11/19/16 0543  BP: 134/79 123/70 133/78 128/65  Pulse: (!) 139 98 97 100  Resp: 17 (!) _0 Temp:    97.7 F (36.5 C)  TempSrc:    Oral  SpO2: (!) 86% 96% 97% 98%  Weight:      Height:        General: Pt is alert, awake, not in acute distress Cardiovascular: RRR, S1/S2 +, no rubs, no gallops Respiratory: CTA bilaterally, no wheezing, no rhonchi Abdominal: Soft, NT, ND, bowel sounds + Extremities: no edema, no cyanosis    The results of significant diagnostics from this hospitalization (including imaging, microbiology, ancillary and laboratory) are listed below for reference.     Microbiology: No results found for this or any previous visit (from the past 240 hour(s)).   Labs: BNP (last 3 results)  Recent Labs  09/24/16 0847 10/02/16 2004 11/18/16 2330  BNP 511.0* 416.3* 650.3*   Basic Metabolic Panel:  Recent Labs Lab 11/18/16 2303  NA 134*  K 3.5  CL 101  CO2 25  GLUCOSE 136*  BUN 8  CREATININE 0.50*  CALCIUM 9.8   Liver Function Tests:  Recent Labs Lab 11/18/16 2303  AST 70*  ALT 11*  ALKPHOS 142*  BILITOT 3.6*  PROT 7.0  ALBUMIN 3.8   No results for input(s): LIPASE, AMYLASE in the last 168 hours. No results for input(s): AMMONIA in the last 168 hours. CBC:  Recent Labs Lab 11/18/16 2303  WBC 4.0  NEUTROABS 3.4  HGB 8.6*  HCT 28.2*  MCV 92.2  PLT 64*   Cardiac Enzymes:  Recent Labs Lab 11/19/16 1009  TROPONINI <0.03   BNP: Invalid input(s): POCBNP CBG:  Recent Labs Lab 11/19/16 0800 11/19/16 1144  GLUCAP 123* 127*   D-Dimer No results  for input(s): DDIMER in the last 72 hours. Hgb A1c No results for input(s): HGBA1C in the last 72 hours. Lipid Profile No results for input(s): CHOL, HDL, LDLCALC, TRIG, CHOLHDL, LDLDIRECT in the last 72 hours. Thyroid function studies No results for  input(s): TSH, T4TOTAL, T3FREE, THYROIDAB in the last 72 hours.  Invalid input(s): FREET3 Anemia work up No results for input(s): VITAMINB12, FOLATE, FERRITIN, TIBC, IRON, RETICCTPCT in the last 72 hours. Urinalysis    Component Value Date/Time   COLORURINE YELLOW 10/02/2016 0026   APPEARANCEUR CLEAR 10/02/2016 0026   LABSPEC 1.008 10/02/2016 0026   PHURINE 7.0 10/02/2016 0026   GLUCOSEU NEGATIVE 10/02/2016 0026   GLUCOSEU NEGATIVE 04/25/2012 1108   HGBUR SMALL (A) 10/02/2016 0026   BILIRUBINUR NEGATIVE 10/02/2016 0026   KETONESUR NEGATIVE 10/02/2016 0026   PROTEINUR NEGATIVE 10/02/2016 0026   UROBILINOGEN 0.2 05/17/2012 1021   NITRITE NEGATIVE 10/02/2016 0026   LEUKOCYTESUR NEGATIVE 10/02/2016 0026   Sepsis Labs Invalid input(s): PROCALCITONIN,  WBC,  LACTICIDVEN Microbiology No results found for this or any previous visit (from the past 240 hour(s)).   Time coordinating discharge: 30 minutes  SIGNED:   Rosita Fire, MD  [facility] 11/19/2016, 11:54 AM  If 7PM-7AM, please contact night-coverage [email or web address] Password [email or web address]
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
## 2016-11-19 NOTE — Progress Notes (Signed)
Patient and wife given discharge instructions and all questions answered.  

## 2016-11-21 ENCOUNTER — Ambulatory Visit: Payer: Medicare Other | Admitting: Physician Assistant

## 2016-11-21 ENCOUNTER — Ambulatory Visit (INDEPENDENT_AMBULATORY_CARE_PROVIDER_SITE_OTHER): Payer: Medicare Other | Admitting: Physician Assistant

## 2016-11-21 ENCOUNTER — Encounter: Payer: Self-pay | Admitting: Physician Assistant

## 2016-11-21 VITALS — BP 92/58 | HR 84 | Ht 69.0 in | Wt 168.0 lb

## 2016-11-21 DIAGNOSIS — C9002 Multiple myeloma in relapse: Secondary | ICD-10-CM | POA: Diagnosis not present

## 2016-11-21 DIAGNOSIS — E119 Type 2 diabetes mellitus without complications: Secondary | ICD-10-CM | POA: Diagnosis not present

## 2016-11-21 DIAGNOSIS — E785 Hyperlipidemia, unspecified: Secondary | ICD-10-CM

## 2016-11-21 DIAGNOSIS — Z79899 Other long term (current) drug therapy: Secondary | ICD-10-CM

## 2016-11-21 DIAGNOSIS — Z953 Presence of xenogenic heart valve: Secondary | ICD-10-CM

## 2016-11-21 DIAGNOSIS — I5032 Chronic diastolic (congestive) heart failure: Secondary | ICD-10-CM

## 2016-11-21 DIAGNOSIS — D649 Anemia, unspecified: Secondary | ICD-10-CM

## 2016-11-21 DIAGNOSIS — I10 Essential (primary) hypertension: Secondary | ICD-10-CM | POA: Diagnosis not present

## 2016-11-21 NOTE — Patient Instructions (Addendum)
Medication Instructions:   No changes, continue current dose of medications including the lasix (furosemide) at 60mg  daily. As discussed, on Friday ONLY, take 80mg  of furosemide.  Labwork:   BMET and CBC on Wednesday or Friday  Testing/Procedures:  none  Follow-Up:  With Almyra Deforest PA on October 23rd at 11:30am.  If you need a refill on your cardiac medications before your next appointment, please call your pharmacy.

## 2016-11-21 NOTE — Progress Notes (Signed)
Cardiology Office Note    Date:  11/21/2016   ID:  HARRIET BOLLEN, DOB 05/01/1945, MRN [id number]  PCP:  [facility]  Cardiologist:  Dr. Stanford Breed  Chief Complaint  [name] presents with  . Follow-up    hosiptal followup, correct dosage of lasix     History of Present Illness:  [name] is a 71 y.o. male with PMH of multiple myeloma, DM II, HTN, HLD, OSA, RLS, and aortic stenosis s/p porcine AVR 2006 and redo tissue AVR 12/2011. Cardiac catheterization prior to aVR on 11/28/2011 showed normal coronaries, severe aortic stenosis. Abdominal ultrasound in January 2017 showed no aneurysm. He has history of GI blood loss requiring multiple blood transfusions. He is also being treated for multiple myeloma. He has a TEE performed on 10/27/2015 to see if hemolysis from his valve may be contributing, this revealed a normal LV systolic function, bioprosthetic aortic valve with elevated mean gradient of 31 mmHg and moderate AI that was paravalvular. There was also mild mitral regurgitation, mild biatrial enlargement and mild right ventricular enlargement. The elevated pressure gradient was discussed with CT surgery Dr. Roxy Manns who felt the [name] will be high risk for redo aortic valve replacement giving his comorbidities. Dr. Stanford Breed also discussed with Dr. Norma Fredrickson of hematology/oncology at [facility], it was felt that his anemia was multifactorial involving hemolysis, myeloma and predominantly GI loss. He was also found to be in atrial flutter during the previous office visit. Echocardiogram obtained in 08/2016 showed a vigorous LV function, AVR with mild AI, mean gradient 18 mmHg, biatrial enlargement. Holter monitor in July 2018 showed atrial flutter which was rate controlled. CTA showed no PE, CHF. He had another echocardiogram at [facility] on 10/14/2016, this showed EF 60-65%, peak aortic valve gradient 63 mmHg, mean aortic valve gradient 30 mmHg,  bioprosthetic aortic valve with diffuse thickening of the aortic valve with restricted cusp opening. He was last seen by Dr. Stanford Breed on 10/24/2016.  He was most recently admitted to the hospital on 11/18/2016 with acute shortness of breath. He received IV Lasix with significant improvement. He was discharged on 60 mg daily of Lasix. He does keep daily weight. His blood pressure is borderline low today. Talking with the [name], it sounds like he has been receiving 3 units of blood transfusion every week. He goes to the oncology clinic every Wednesday and Friday. Between the 2 units of red blood cell he receives on Wednesday, he does receive a dose of IV Lasix. However he does not have any IV Lasix on Friday when he received 1 unit of red blood cell count. He went to the hospital Friday night with increasing shortness of breath that started on that day only. To compensate for this, I will keep him on 60 mg daily of Lasix except for Friday when he will take Lasix 80 mg. He will need a basic metabolic panel and CBC this Friday as per instruction on recent discharge.    Past Medical History:  Diagnosis Date  . Allergic rhinitis   . Anemia 2009  . Aortic stenosis    a. s/p porcine AVR 2006;  b. s/p redo tissue AVR 12/2011 (Dr. Roxy Manns) - preAVR LHC with no CAD  . Asthma   . BPH (benign prostatic hypertrophy)   . Cancer ([place])    multiple myeloma  . Cataract   . Chronic cough   . Chronic interstitial cystitis   . Depression    pt denies  .  Diabetes mellitus    type 2  . Diabetes mellitus without complication ([place])   . Diverticulosis of colon    on colonoscopy 2008  . GERD (gastroesophageal reflux disease)    bravo pH study 2008  . Gout   . H/O aortic valve replacement with porcine valve    2006, 2013  . Headache(784.0)   . Hemorrhoids    external and internal  . Hiatal hernia   . HTN (hypertension)   . Hx of echocardiogram    a. Echo  (post AVR) 12/2011:  mod LVH, EF 60-65%, Gr 2 diast  dysfn, mild AI, AVR ok (mean gradient 19 mmHg), MAC, mild BAE  . Hyperlipidemia   . Hypertension   . Hyperthyroidism   . IBS (irritable bowel syndrome)   . Insomnia   . Lower GI bleed 07/16/2015  . OA (osteoarthritis)   . OSA (obstructive sleep apnea)    USES CPAP AS NEEDED  . 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 Periodontitis    chronic with bone loss  . Restless leg syndrome   . S/P aortic valve replacement with bioprosthetic valve 12/06/2011   Redo AVR using 23 mm [facility] Ease pericardial tissue valve    Past Surgical History:  Procedure Laterality Date  . AORTA - FEMORAL ARTERY BYPASS GRAFT    . AORTIC VALVE REPLACEMENT  10/13/2004   69m [place] pericardial tissue valve  . AORTIC VALVE REPLACEMENT  12/06/2011   Procedure: REDO AORTIC VALVE REPLACEMENT (AVR);  Surgeon: CRexene Alberts MD;  [place] [facility]  Service: Open Heart Surgery;  Laterality: N/A;  . BUNIONECTOMY     right  . CARDIAC SURGERY     aorta vavle replacement  . CATARACT EXTRACTION  2009    &   2012   BILATERAL  . COLONOSCOPY  08/31/2011   Procedure: COLONOSCOPY;  Surgeon: RInda Castle MD;  [place]  Service: Endoscopy;  Laterality: N/A;  . ESOPHAGOGASTRODUODENOSCOPY  08/30/2011   Procedure: ESOPHAGOGASTRODUODENOSCOPY (EGD);  Surgeon: RInda Castle MD;  [place]  Service: Endoscopy;  Laterality: N/A;  Rm 3005   . HERNIA REPAIR    . JOINT REPLACEMENT     knee  . LEFT HEART CATHETERIZATION WITH CORONARY ANGIOGRAM N/A 11/30/2011   Procedure: LEFT HEART CATHETERIZATION WITH CORONARY ANGIOGRAM;  Surgeon: CBurnell Blanks MD;  [facility];  Service: Cardiovascular;  Laterality: N/A;  . NASAL SEPTOPLASTY W/ TURBINOPLASTY    . REFRACTIVE SURGERY     bilateral  . RIGHT HEART CATHETERIZATION Bilateral 11/30/2011   Procedure: RIGHT HEART CATH;  Surgeon: CBurnell Blanks MD;  [facility];  Service:  Cardiovascular;  Laterality: Bilateral;  . right knee arthroscopy    . TEE WITHOUT CARDIOVERSION N/A 10/27/2015   Procedure: TRANSESOPHAGEAL ECHOCARDIOGRAM (TEE);  Surgeon: BLelon Perla MD;  [facility];  Service: Cardiovascular;  Laterality: N/A;    Current Medications: Outpatient Medications Prior to Visit  Medication Sig Dispense Refill  . acetaminophen (TYLENOL) 325 MG tablet Take 650 mg by mouth every 6 (six) hours as needed for fever.     .Marland Kitchenacyclovir (ZOVIRAX) 200 MG capsule Take 200 mg by mouth 2 (two) times daily.    . carbidopa-levodopa (SINEMET IR) 25-100 MG tablet Take 3 tablets by mouth 4 (four) times daily.     . cyanocobalamin 500 MCG tablet Take 500 mcg by mouth 2 (two) times daily. Lunch/dinner    . cyclobenzaprine (  FLEXERIL) 10 MG tablet Take 5 mg by mouth 3 (three) times daily as needed for muscle spasms.    Marland Kitchen dexamethasone (DECADRON) 4 MG tablet Take 10 mg by mouth 2 (two) times a week. For 3 weeks then off a week    . diazepam (VALIUM) 2 MG tablet Take 2 mg by mouth at bedtime as needed for anxiety.     . digoxin (LANOXIN) 0.125 MG tablet Take 1 tablet (0.125 mg total) by mouth daily. 30 tablet 1  . docusate sodium (COLACE) 100 MG capsule Take 100 mg by mouth 2 (two) times daily as needed for moderate constipation.     . finasteride (PROSCAR) 5 MG tablet Take 5 mg by mouth daily.    . fish oil-omega-3 fatty acids 1000 MG capsule Take 1 g by mouth daily with lunch.     . folic acid (FOLVITE) 1 MG tablet Take 1 mg by mouth daily.    . furosemide (LASIX) 40 MG tablet Take 1.5 tablets (60 mg total) by mouth daily. 135 tablet 3  . gabapentin (NEURONTIN) 800 MG tablet Take 400-800 mg by mouth See admin instructions. Take 1/2 tablet every morning and at bedtime then take 1 tablet at lunch and dinner    . hydrocortisone (ANUSOL-HC) 2.5 % rectal cream Place 1 application rectally 2 (two) times daily as needed for hemorrhoids.     . ixazomib citrate (NINLARO) 3 MG  capsule Take 3 mg by mouth once a week. Off on days 1, 8 and 15 of a 28 day cycle. Take on an empty stomach 1hr before or 2hrs after food. Do not crush, chew, or open.    . metFORMIN (GLUCOPHAGE) 500 MG tablet Take 500 mg by mouth 2 (two) times daily with a meal.    . methylcellulose (ARTIFICIAL TEARS) 1 % ophthalmic solution Place 1 drop into both eyes at bedtime as needed (dry eyes).    . ondansetron (ZOFRAN) 4 MG tablet Take 4 mg by mouth every 8 (eight) hours as needed for nausea or vomiting.    . pantoprazole (PROTONIX) 40 MG tablet Take 1 tablet (40 mg total) by mouth daily. For GERD ([name] taking differently: Take 40 mg by mouth daily with lunch. For GERD) 30 tablet 3  . Polyvinyl Alcohol-Povidone PF (REFRESH) 1.4-0.6 % SOLN Place 1 drop into both eyes 2 (two) times daily as needed (dry eyes).    . simvastatin (ZOCOR) 40 MG tablet Take 20 mg by mouth at bedtime.     . traMADol (ULTRAM) 50 MG tablet Take 50 mg by mouth every 6 (six) hours as needed for moderate pain.    Marland Kitchen UNABLE TO FIND Place 1 application into the right eye daily as needed (eye care). Ocular Ointment     . zinc oxide 20 % ointment Apply 1 application topically 2 (two) times daily as needed for irritation.     No facility-administered medications prior to visit.      Allergies:   Avodart [dutasteride]; Spironolactone; Codeine; Diazepam; Doxazosin; and Feraheme [ferumoxytol]   Social History   Social History  . Marital status: Married    Spouse name: N/A  . Number of children: 0  . Years of education: N/A   Occupational History  . retired    Social History Main Topics  . Smoking status: Former Smoker    Quit date: 09/30/1971  . Smokeless tobacco: Never Used     Comment: Quit August 973  . Alcohol use No  . Drug use:  No  . Sexual activity: Not Currently   Other Topics Concern  . None   Social History Narrative   ** Merged History Encounter **         Family History:  The [name]'s family history  includes Cancer in his maternal aunt; Heart disease in his father; Hyperlipidemia in his unknown relative; Hypertension in his sister; Osteoarthritis in his mother.   ROS:   Please see the history of present illness.    ROS All other systems reviewed and are negative.   PHYSICAL EXAM:   VS:  BP (!) 92/58   Pulse 84   Ht _0  (1.753 m)   Wt 168 lb (76.2 kg)   BMI 24.81 kg/m    GEN: Well nourished, well developed, in no acute distress  HEENT: normal  Neck: no JVD, carotid bruits, or masses Cardiac: RRR; no rubs, or gallops,no edema  +1/5 systolic murmur. Right upper pectoral Port-A-Cath Respiratory:  clear to auscultation bilaterally, normal work of breathing GI: soft, nontender, nondistended, + BS MS: no deformity or atrophy  Skin: warm and dry, no rash Neuro:  Alert and Oriented x 3, Strength and sensation are intact Psych: euthymic mood, full affect  Wt Readings from Last 3 Encounters:  11/21/16 168 lb (76.2 kg)  11/18/16 167 lb (75.8 kg)  10/24/16 168 lb (76.2 kg)      Studies/Labs Reviewed:   EKG:  EKG is not ordered today.   Recent Labs: 11/18/2016: ALT 11; B Natriuretic Peptide 268.9; BUN 8; Creatinine, Ser 0.50; Hemoglobin 8.6; Platelets 64; Potassium 3.5; Sodium 134   Lipid Panel    Component Value Date/Time   CHOL 133 02/12/2015 0829   TRIG 293 (H) 02/12/2015 0829   HDL 38 (L) 02/12/2015 0829   CHOLHDL 3.5 02/12/2015 0829   VLDL 59 (H) 02/12/2015 0829   LDLCALC 36 02/12/2015 0829    Additional studies/ records that were reviewed today include:   Echo 08/21/2016 LV EF: 65% -   70%  Study Conclusions  - Left ventricle: The cavity size was normal. There was mild   concentric hypertrophy. Systolic function was vigorous. The   estimated ejection fraction was in the range of 65% to 70%. - Ventricular septum: Septal motion showed paradox. - Aortic valve: A bioprosthesis was present. Moderate leaflet   thickening and restricted motion is seen. There was  mild   regurgitation directed centrally in the LVOT. There was mild   perivalvular regurgitation. Valve area (VTI): 1.31 cm^2. Valve   area (Vmax): 1.31 cm^2. Valve area (Vmean): 1.47 cm^2. - Mitral valve: Calcified annulus. - Left atrium: The atrium was severely dilated. - Right atrium: The atrium was moderately dilated. - Pulmonary arteries: Systolic pressure was mildly increased. PA   peak pressure: 41 mm Hg (S).  Impressions:  - Aortic valve gradients may be underestimated due to poor beam   alignment, but there appears to be only moderate prosthetic valve   stenosis.     ASSESSMENT:    1. Chronic diastolic heart failure ([place])   2. Anemia, unspecified type   3. Encounter for long-term (current) use of medications   4. Multiple myeloma in relapse ([place])   5. Essential hypertension   6. Hyperlipidemia, unspecified hyperlipidemia type   7. Controlled type 2 diabetes mellitus without complication, without long-term current use of insulin ([place])   8. History of aortic valve replacement with bioprosthetic valve      PLAN:  In order of problems listed above:  1.  Chronic diastolic heart failure: Recently seen in the hospital for volume overload, he has been receiving 2 units of packed red blood cell was in the white unit on Friday. He does receive of 40 mg one time dose of IV Lasix on Wednesday to compensate for blood transfusion. However he did not receive any Lasix on Friday according to his wife, I wonder if this could've triggered the episode of of acute heart failure. I instructed the [name] to take an additional 20 mg Lasix on days when he receive PRBC but no IV Lasix.  2. Anemia: Related to GI blood loss and chronic myeloma. Received blood transfusion on a weekly basis.  3. Multiple myeloma: Managed by hematology/oncology, recently restarted on chemotherapy. He also had a Port-A-Cath placed  4. Bioprosthetic aortic valve: Significant murmur on physical exam, likely  related to perivalvular leak, not a candidate for a redo redo AVR.  5. Hypertension: Blood pressure borderline today, he is due for CBC and basic metabolic panel this Friday.   6. Hyperlipidemia: On 20 mg daily of Zocor  7. DM 2: Managed by primary care provider.    Medication Adjustments/Labs and Tests Ordered: Current medicines are reviewed at length with the [name] today.  Concerns regarding medicines are outlined above.  Medication changes, Labs and Tests ordered today are listed in the [name] Instructions below. [name] Instructions  Medication Instructions:   No changes, continue current dose of medications including the lasix (furosemide) at 62m daily. As discussed, on Friday ONLY, take 854mof furosemide.  Labwork:   BMET and CBC on Wednesday or Friday  Testing/Procedures:  none  Follow-Up:  With HaAlmyra DeforestA on October 23rd at 11:30am.  If you need a refill on your cardiac medications before your next appointment, please call your pharmacy.      SiHilbert CorriganPAUtah10/15/2018 9:24 PM    [place]  [id number]: [phone]: [phone]

## 2016-11-29 ENCOUNTER — Encounter: Payer: Self-pay | Admitting: Physician Assistant

## 2016-11-29 ENCOUNTER — Ambulatory Visit (INDEPENDENT_AMBULATORY_CARE_PROVIDER_SITE_OTHER): Payer: Medicare Other | Admitting: Physician Assistant

## 2016-11-29 VITALS — BP 94/62 | HR 84 | Ht 69.0 in | Wt 167.8 lb

## 2016-11-29 DIAGNOSIS — E119 Type 2 diabetes mellitus without complications: Secondary | ICD-10-CM | POA: Diagnosis not present

## 2016-11-29 DIAGNOSIS — I5032 Chronic diastolic (congestive) heart failure: Secondary | ICD-10-CM

## 2016-11-29 DIAGNOSIS — I10 Essential (primary) hypertension: Secondary | ICD-10-CM | POA: Diagnosis not present

## 2016-11-29 DIAGNOSIS — E876 Hypokalemia: Secondary | ICD-10-CM | POA: Diagnosis not present

## 2016-11-29 DIAGNOSIS — C9002 Multiple myeloma in relapse: Secondary | ICD-10-CM

## 2016-11-29 DIAGNOSIS — Z953 Presence of xenogenic heart valve: Secondary | ICD-10-CM | POA: Diagnosis not present

## 2016-11-29 DIAGNOSIS — E785 Hyperlipidemia, unspecified: Secondary | ICD-10-CM | POA: Diagnosis not present

## 2016-11-29 DIAGNOSIS — D649 Anemia, unspecified: Secondary | ICD-10-CM | POA: Diagnosis not present

## 2016-11-29 NOTE — Progress Notes (Signed)
 Cardiology Office Note    Date:  11/29/2016   ID:  [name], DOB 06/09/1945, MRN [id number]  PCP:  [facility]  Cardiologist:  Dr. Brian Crenshaw /   PA-C  Chief Complaint  Patient presents with  . Follow-up    seen for Dr. Crenshaw    History of Present Illness:  [name] is a 71 y.o. male with PMH of multiple myeloma, DM II, HTN, HLD, OSA, RLS, and aortic stenosis s/p porcine AVR 2006 and redo tissue AVR 12/2011. Cardiac catheterization prior to AVR on 11/28/2011 showed normal coronaries, severe aortic stenosis. Abdominal ultrasound in January 2017 showed no aneurysm. He has history of GI blood loss requiring multiple blood transfusions. He is also being treated for multiple myeloma. He has a TEE performed on 10/27/2015 to see if hemolysis from his valve may be contributing, this revealed a normal LV systolic function, bioprosthetic aortic valve with elevated mean gradient of 31 mmHg and moderate AI that was paravalvular. There was also mild mitral regurgitation, mild biatrial enlargement and mild right ventricular enlargement. The elevated pressure gradient was discussed with CT surgery Dr. Owen who felt the patient will be high risk for redo aortic valve replacement giving his comorbidities. Dr. Crenshaw also discussed with Dr. Rodriguez of hematology/oncology at [facility], it was felt that his anemia was multifactorial involving hemolysis, myeloma and predominantly GI loss. He was also found to be in atrial flutter during the previous office visit. Echocardiogram obtained in 08/2016 showed a vigorous LV function, AVR with mild AI, mean gradient 18 mmHg, biatrial enlargement. Holter monitor in July 2018 showed atrial flutter which was rate controlled. CTA showed no PE, CHF. He had another echocardiogram at [facility] on 10/14/2016, this showed EF 60-65%, peak aortic valve gradient 63 mmHg, mean aortic valve gradient 30 mmHg,  bioprosthetic aortic valve with diffuse thickening of the aortic valve with restricted cusp opening. He was last seen by Dr. Crenshaw on 10/24/2016.  He was most recently admitted to the hospital on 11/18/2016 with acute shortness of breath. He received IV Lasix with significant improvement. He was discharged on 60 mg daily of Lasix. He does keep daily weight. His blood pressure is borderline low today. Talking with the patient, it sounds like he has been receiving 3 units of blood transfusion every week. He goes to the oncology clinic every Wednesday and Friday. Between the 2 units of red blood cell he receives on Wednesday, he does receive a dose of IV Lasix. However he does not have any IV Lasix on Friday when he received 1 unit of red blood cell count. He went to the hospital Friday night with increasing shortness of breath that started on that day only. To compensate for this, I kept him on 60 mg daily of Lasix except for Friday when he will take Lasix 80 mg.   Patient presents today to cardiology office visit accompanied by his wife. Since the last time I saw him, he has had 3 episodes of shortness of breath. He says the shortness of breath happen very quickly. All 3 episodes happened last Wednesday and Friday around 8-9 PM. Those other days when he receive blood transfusion as well. The symptom progressed to the point he was afraid to fall asleep on those days. Given the sudden nature of his shortness of breath, I think this also support the theory that his sudden fluid accumulation is related to blood transfusion in the setting of   significant aortic valvular issues. With the recent episodes, I continued his Lasix to 60 mg daily with instruction of taking 60 mg twice a day on the day he receive blood transfusion. This 60 mg twice a day dosing also include however much IV Lasix he received between transfusion. He was recently started on potassium supplement, however he does not know how much potassium he  is taking. He will need to let us know the dosage after he gets home tonight. His wife will inform the lab to fax us a copy of his basic metabolic panel for next Tuesday. Depending on the lab result I may have to increase the potassium supplement. Recent lab work obtained on 11/25/2016 shows hemoglobin 7.8. He also showed total bilirubin level elevated at 2.8. It maybe worth awhile to check LDH level and haptoglobin level to see how much hemolysis is contributing to his anemia. He has visitation with GI service next month for his GI bleed.    Past Medical History:  Diagnosis Date  . Allergic rhinitis   . Anemia 2009  . Aortic stenosis    a. s/p porcine AVR 2006;  b. s/p redo tissue AVR 12/2011 (Dr. Owen) - preAVR LHC with no CAD  . Asthma   . BPH (benign prostatic hypertrophy)   . Cancer (HCC)    multiple myeloma  . Cataract   . Chronic cough   . Chronic interstitial cystitis   . Depression    pt denies  . Diabetes mellitus    type 2  . Diabetes mellitus without complication (HCC)   . Diverticulosis of colon    on colonoscopy 2008  . GERD (gastroesophageal reflux disease)    bravo pH study 2008  . Gout   . H/O aortic valve replacement with porcine valve    2006, 2013  . Headache(784.0)   . Hemorrhoids    external and internal  . Hiatal hernia   . HTN (hypertension)   . Hx of echocardiogram    a. Echo  (post AVR) 12/2011:  mod LVH, EF 60-65%, Gr 2 diast dysfn, mild AI, AVR ok (mean gradient 19 mmHg), MAC, mild BAE  . Hyperlipidemia   . Hypertension   . Hyperthyroidism   . IBS (irritable bowel syndrome)   . Insomnia   . Lower GI bleed 07/16/2015  . OA (osteoarthritis)   . OSA (obstructive sleep apnea)    USES CPAP AS NEEDED  . 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 Periodontitis    chronic with bone loss  . Restless leg syndrome   . S/P aortic valve replacement with bioprosthetic valve 12/06/2011   Redo AVR using 23 mm Edwards Magna Ease pericardial  tissue valve    Past Surgical History:  Procedure Laterality Date  . AORTA - FEMORAL ARTERY BYPASS GRAFT    . AORTIC VALVE REPLACEMENT  10/13/2004   25mm Edwards Perimount pericardial tissue valve  . AORTIC VALVE REPLACEMENT  12/06/2011   Procedure: REDO AORTIC VALVE REPLACEMENT (AVR);  Surgeon: Clarence H Owen, MD;  Location: [facility] OR;  Service: Open Heart Surgery;  Laterality: N/A;  . BUNIONECTOMY     right  . CARDIAC SURGERY     aorta vavle replacement  . CATARACT EXTRACTION  2009    &   2012   BILATERAL  . COLONOSCOPY  08/31/2011   Procedure: COLONOSCOPY;  Surgeon: Robert D Kaplan, MD;  Location: [facility] ENDOSCOPY;  Service: Endoscopy;  Laterality: N/A;  . ESOPHAGOGASTRODUODENOSCOPY  08/30/2011     Procedure: ESOPHAGOGASTRODUODENOSCOPY (EGD);  Surgeon: Inda Castle, MD;  Location: [place];  Service: Endoscopy;  Laterality: N/A;  Rm 3005   . HERNIA REPAIR    . JOINT REPLACEMENT     knee  . LEFT HEART CATHETERIZATION WITH CORONARY ANGIOGRAM N/A 11/30/2011   Procedure: LEFT HEART CATHETERIZATION WITH CORONARY ANGIOGRAM;  Surgeon: Burnell Blanks, MD;  Location: [facility] CATH LAB;  Service: Cardiovascular;  Laterality: N/A;  . NASAL SEPTOPLASTY W/ TURBINOPLASTY    . REFRACTIVE SURGERY     bilateral  . RIGHT HEART CATHETERIZATION Bilateral 11/30/2011   Procedure: RIGHT HEART CATH;  Surgeon: Burnell Blanks, MD;  Location: [facility] CATH LAB;  Service: Cardiovascular;  Laterality: Bilateral;  . right knee arthroscopy    . TEE WITHOUT CARDIOVERSION N/A 10/27/2015   Procedure: TRANSESOPHAGEAL ECHOCARDIOGRAM (TEE);  Surgeon: Lelon Perla, MD;  Location: [facility] ENDOSCOPY;  Service: Cardiovascular;  Laterality: N/A;    Current Medications: Outpatient Medications Prior to Visit  Medication Sig Dispense Refill  . acetaminophen (TYLENOL) 325 MG tablet Take 650 mg by mouth every 6 (six) hours as needed for fever.     Marland Kitchen acyclovir (ZOVIRAX) 200 MG capsule Take 200 mg by mouth 2 (two) times  daily.    . carbidopa-levodopa (SINEMET IR) 25-100 MG tablet Take 3 tablets by mouth 4 (four) times daily.     . cyanocobalamin 500 MCG tablet Take 500 mcg by mouth 2 (two) times daily. Lunch/dinner    . cyclobenzaprine (FLEXERIL) 10 MG tablet Take 5 mg by mouth 3 (three) times daily as needed for muscle spasms.    Marland Kitchen dexamethasone (DECADRON) 4 MG tablet Take 10 mg by mouth 2 (two) times a week. For 3 weeks then off a week    . diazepam (VALIUM) 2 MG tablet Take 2 mg by mouth at bedtime as needed for anxiety.     . digoxin (LANOXIN) 0.125 MG tablet Take 1 tablet (0.125 mg total) by mouth daily. 30 tablet 1  . docusate sodium (COLACE) 100 MG capsule Take 100 mg by mouth 2 (two) times daily as needed for moderate constipation.     . finasteride (PROSCAR) 5 MG tablet Take 5 mg by mouth daily.    . fish oil-omega-3 fatty acids 1000 MG capsule Take 1 g by mouth daily with lunch.     . folic acid (FOLVITE) 1 MG tablet Take 1 mg by mouth daily.    . furosemide (LASIX) 40 MG tablet Take 1.5 tablets (60 mg total) by mouth daily. 135 tablet 3  . gabapentin (NEURONTIN) 800 MG tablet Take 400-800 mg by mouth See admin instructions. Take 1/2 tablet every morning and at bedtime then take 1 tablet at lunch and dinner    . hydrocortisone (ANUSOL-HC) 2.5 % rectal cream Place 1 application rectally 2 (two) times daily as needed for hemorrhoids.     . ixazomib citrate (NINLARO) 3 MG capsule Take 3 mg by mouth once a week. Off on days 1, 8 and 15 of a 28 day cycle. Take on an empty stomach 1hr before or 2hrs after food. Do not crush, chew, or open.    . metFORMIN (GLUCOPHAGE) 500 MG tablet Take 500 mg by mouth 2 (two) times daily with a meal.    . methylcellulose (ARTIFICIAL TEARS) 1 % ophthalmic solution Place 1 drop into both eyes at bedtime as needed (dry eyes).    . ondansetron (ZOFRAN) 4 MG tablet Take 4 mg by mouth every 8 (eight) hours as  needed for nausea or vomiting.    . pantoprazole (PROTONIX) 40 MG tablet  Take 1 tablet (40 mg total) by mouth daily. For GERD (Patient taking differently: Take 40 mg by mouth daily with lunch. For GERD) 30 tablet 3  . Polyvinyl Alcohol-Povidone PF (REFRESH) 1.4-0.6 % SOLN Place 1 drop into both eyes 2 (two) times daily as needed (dry eyes).    . simvastatin (ZOCOR) 40 MG tablet Take 20 mg by mouth at bedtime.     . traMADol (ULTRAM) 50 MG tablet Take 50 mg by mouth every 6 (six) hours as needed for moderate pain.    Marland Kitchen UNABLE TO FIND Place 1 application into the right eye daily as needed (eye care). Ocular Ointment     . zinc oxide 20 % ointment Apply 1 application topically 2 (two) times daily as needed for irritation.     No facility-administered medications prior to visit.      Allergies:   Avodart [dutasteride]; Spironolactone; Codeine; Diazepam; Doxazosin; and Feraheme [ferumoxytol]   Social History   Social History  . Marital status: Married    Spouse name: N/A  . Number of children: 0  . Years of education: N/A   Occupational History  . retired    Social History Main Topics  . Smoking status: Former Smoker    Quit date: 09/30/1971  . Smokeless tobacco: Never Used     Comment: Quit August 973  . Alcohol use No  . Drug use: No  . Sexual activity: Not Currently   Other Topics Concern  . None   Social History Narrative   ** Merged History Encounter **         Family History:  The patient's family history includes Cancer in his maternal aunt; Heart disease in his father; Hyperlipidemia in his unknown relative; Hypertension in his sister; Osteoarthritis in his mother.   ROS:   Please see the history of present illness.    ROS All other systems reviewed and are negative.   PHYSICAL EXAM:   VS:  BP 94/62   Pulse 84   Ht 5' 9" (1.753 m)   Wt 167 lb 12.8 oz (76.1 kg)   SpO2 98%   BMI 24.78 kg/m    GEN: Frail, pale  HEENT: normal  Neck: no JVD, carotid bruits, or masses Cardiac: RRR; no rubs, or gallops,no edema  4/6 systolic murmur at  R and LUSB Respiratory:  clear to auscultation bilaterally, normal work of breathing GI: soft, nontender, nondistended, + BS MS: no deformity or atrophy  Skin: warm and dry, no rash Neuro:  Alert and Oriented x 3, Strength and sensation are intact Psych: euthymic mood, full affect  Wt Readings from Last 3 Encounters:  11/29/16 167 lb 12.8 oz (76.1 kg)  11/21/16 168 lb (76.2 kg)  11/18/16 167 lb (75.8 kg)      Studies/Labs Reviewed:   EKG:  EKG is not ordered today.    Recent Labs: 11/18/2016: ALT 11; B Natriuretic Peptide 268.9; BUN 8; Creatinine, Ser 0.50; Hemoglobin 8.6; Platelets 64; Potassium 3.5; Sodium 134   Lipid Panel    Component Value Date/Time   CHOL 133 02/12/2015 0829   TRIG 293 (H) 02/12/2015 0829   HDL 38 (L) 02/12/2015 0829   CHOLHDL 3.5 02/12/2015 0829   VLDL 59 (H) 02/12/2015 0829   LDLCALC 36 02/12/2015 0829    Additional studies/ records that were reviewed today include:   Echo 08/21/2016 LV EF: 65% - 70%  Study  Conclusions  - Left ventricle: The cavity size was normal. There was mild concentric hypertrophy. Systolic function was vigorous. The estimated ejection fraction was in the range of 65% to 70%. - Ventricular septum: Septal motion showed paradox. - Aortic valve: A bioprosthesis was present. Moderate leaflet thickening and restricted motion is seen. There was mild regurgitation directed centrally in the LVOT. There was mild perivalvular regurgitation. Valve area (VTI): 1.31 cm^2. Valve area (Vmax): 1.31 cm^2. Valve area (Vmean): 1.47 cm^2. - Mitral valve: Calcified annulus. - Left atrium: The atrium was severely dilated. - Right atrium: The atrium was moderately dilated. - Pulmonary arteries: Systolic pressure was mildly increased. PA peak pressure: 41 mm Hg (S).  Impressions:  - Aortic valve gradients may be underestimated due to poor beam alignment, but there appears to be only moderate prosthetic  valve stenosis.    Echo 10/14/2016 at [facility] SUMMARY The left ventricular size is normal. There is moderate concentric left ventricular hypertrophy.  Left ventricular systolic function is normal. LV ejection fraction = 60-65%. No segmental wall motion abnormalities seen in the left ventricle The right ventricle is mild to moderately dilated. The right ventricular systolic function is borderline reduced. There is mild aortic regurgitation. There is moderate to severe aortic stenosis. Aortic valve mean pressure gradient is 30 mmHg. There is mild to moderate tricuspid regurgitation. Moderate pulmonary hypertension. Estimated right ventricular systolic pressure is 53 mmHg. There is no pericardial effusion. Probably no significant change in comparison with the prior study of  09/28/2016. Transvalvular gradients are probably underestimated on the current  Study.     Port-A-Cath placement 11/16/2016 .Successful placement of a port-catheter with tip terminating in the right atrium. 2.The catheter is ready for immediate use.  ASSESSMENT:    1. Chronic diastolic heart failure (HCC)   2. Anemia, unspecified type   3. Multiple myeloma in relapse (HCC)   4. Essential hypertension   5. Hyperlipidemia, unspecified hyperlipidemia type   6. Controlled type 2 diabetes mellitus without complication, without long-term current use of insulin (HCC)   7. History of aortic valve replacement with bioprosthetic valve   8. Hypokalemia      PLAN:  In order of problems listed above:  1. Chronic diastolic heart failure: Likely exacerbated by blood transfusion in the setting of aortic valve issue. Continued to have episodic shortness of breath after his blood transfusion. He will continue to take Lasix 60 mg daily however on the days he receive blood transfusion, he will take 60 mg twice a day. He was recently started on potassium supplement due to hypokalemia, he does not know the dosage  of potassium supplement, his wife will give us call later to let us know. Otherwise, he will need a basic metabolic panel next Tuesday and have the results faxed to us.  2. Hypokalemia: Related to diuresis, recently started on potassium supplement, do for repeat basic metabolic panel next Tuesday  3. Anemia: Mainly related to GI loss and chronic myeloma, however cannot completely rule out hemolysis especially given recent finding of elevated bilirubin level. May need to consider LDH and haptoglobin at some point. He has GI physician next month.  4. Multiple myeloma: Managed by hematology/oncology, recently had a Port-A-Cath placed on 11/16/2016. He has restarted on chemotherapy.  5. Bioprosthetic aortic valve: Significant heart murmur on exam. Likely contributing to recurrent volume overload. Related to perivalvular leak, however not a candidate for redo redo aVR.  6. Hypertension: Blood pressure continued to be borderline, not on any blood   pressure medication. Renal function stable on the current dose of diuretic.  7. Hyperlipidemia: On 20 mg daily of Zocor  8. DM 2: Managed by primary care provider     Medication Adjustments/Labs and Tests Ordered: Current medicines are reviewed at length with the patient today.  Concerns regarding medicines are outlined above.  Medication changes, Labs and Tests ordered today are listed in the Patient Instructions below. Patient Instructions  Medication Instructions:   Continue your current dose of lasix 12m DAILY except on transfusion days.  ON TRANSFUSION DAYS ONLY, take lasix 650mTWICE A DAY. However, count any IV lasix given toward this total dose.   (ex. If you take 6063mf lasix in the morning, go to transfusion and are given 39m35m IV lasix, only take 40mg66mlly for the afternoon/evening dose).   Labwork:   Please send us a [place] of the labwork you get done next week. You may fax this to us to[place]'s attention at fax #  [phone]/Procedures:  none  Follow-Up:  Your physician recommends that you schedule a follow-up appointment in: 1 month with [name] MAlmyra DeforestFollow up with Dr. CrensStanford Breedcheduled.   If you need a refill on your cardiac medications before your next appointment, please call your pharmacy.      SigneHilbert Corrigan 1[place]23/2018 1:03 PM    [facility] [address] [place] [id number]: [phone]: [phone]

## 2016-11-29 NOTE — Patient Instructions (Addendum)
Medication Instructions:   Continue your current dose of lasix 60mg  DAILY except on transfusion days.  ON TRANSFUSION DAYS ONLY, take lasix 60mg  TWICE A DAY. However, count any IV lasix given toward this total dose.   (ex. If you take 60mg  of lasix in the morning, go to transfusion and are given 20mg  of IV lasix, only take 40mg  orally for the afternoon/evening dose).   Labwork:   Please send [place] a copy of the labwork you get done next week. You may fax this to [place] to [place]'s attention at fax # [phone]  Testing/Procedures:  none  Follow-Up:  Your physician recommends that you schedule a follow-up appointment in: 1 month with Almyra Deforest PA  Follow up with Dr. Stanford Breed as scheduled.   If you need a refill on your cardiac medications before your next appointment, please call your pharmacy.

## 2016-12-08 ENCOUNTER — Telehealth: Payer: Self-pay

## 2016-12-08 NOTE — Telephone Encounter (Signed)
Patient has brought in labs; Isaac Laud has reviewed them, per Isaac Laud he needs to follow up with his hematologist at [facility]; potassium looks good.   Patient directly notified

## 2016-12-13 ENCOUNTER — Telehealth: Payer: Self-pay

## 2016-12-13 MED ORDER — POTASSIUM CHLORIDE ER 20 MEQ PO TBCR: 20.0000 meq | EXTENDED_RELEASE_TABLET | Freq: Two times a day (BID) | ORAL | 0 refills | Status: DC

## 2016-12-13 NOTE — Telephone Encounter (Signed)
Spoke with patient and informed him to take his potassium twice a day.   [name] spoke with patient.

## 2016-12-13 NOTE — Telephone Encounter (Signed)
-----   Message from [place], [place] sent at 12/13/2016  3:17 PM EST ----- Regarding: FW: Check potassium dosage Please inform patient to increase potassium to 20mg  BID. Recheck BMET at his hematologist's office  Signed, Almyra Deforest PA Pager: [phone]    ----- Message ----- From: Ricci Barker, RN Sent: 12/07/2016  11:11 AM To: Almyra Deforest, PA Subject: RE: Check potassium dosage                     He is taking 20 meq daily   ----- Message ----- From: Almyra Deforest, PA Sent: 12/05/2016   5:41 PM To: Ricci Barker, RN Subject: Check potassium dosage                         Patient's wife was suppose to contact us to let us know what dose of potassium supplement he is taking at home. Please verify the dosage, last K on 11/30/2016 was borderline low 3.3  [name]

## 2016-12-15 NOTE — Telephone Encounter (Signed)
Than you

## 2016-12-16 ENCOUNTER — Telehealth: Payer: Self-pay | Admitting: Physician Assistant

## 2016-12-16 NOTE — Telephone Encounter (Signed)
Spoke to patient, patient would like his OV note to take with him to the GI doctor so he knows why he is going and they can see the note.   States he would rather pick this up than be faxed to MD office.    Also requesting if there is anything particular he should be asking the GI doctor.      Advised I would send to medical records to prepare the note for pick up and route to PA to see if there is any particular questions/concerns he should be asking about.     Patient aware and verbalized understanding.

## 2016-12-16 NOTE — Telephone Encounter (Signed)
New message    Patient wants to know what questions he should ask GI doctor.  Patient states he does not understand follow up care information and wants to be sure when he goes to [facility] next week he knows what questions to ask. Please call

## 2016-12-16 NOTE — Telephone Encounter (Signed)
[place] with me regarding office note. GI physician will workup regarding recurrent severe anemia.

## 2016-12-16 NOTE — Telephone Encounter (Signed)
Medical records aware patient will be picking up OV note on Monday 11/12

## 2016-12-27 ENCOUNTER — Encounter: Payer: Self-pay | Admitting: Physician Assistant

## 2016-12-27 ENCOUNTER — Ambulatory Visit (INDEPENDENT_AMBULATORY_CARE_PROVIDER_SITE_OTHER): Payer: Medicare Other | Admitting: Physician Assistant

## 2016-12-27 VITALS — BP 116/66 | HR 66 | Ht 69.0 in | Wt 177.0 lb

## 2016-12-27 DIAGNOSIS — E785 Hyperlipidemia, unspecified: Secondary | ICD-10-CM | POA: Diagnosis not present

## 2016-12-27 DIAGNOSIS — C9000 Multiple myeloma not having achieved remission: Secondary | ICD-10-CM

## 2016-12-27 DIAGNOSIS — D649 Anemia, unspecified: Secondary | ICD-10-CM

## 2016-12-27 DIAGNOSIS — I5033 Acute on chronic diastolic (congestive) heart failure: Secondary | ICD-10-CM | POA: Diagnosis not present

## 2016-12-27 DIAGNOSIS — I10 Essential (primary) hypertension: Secondary | ICD-10-CM

## 2016-12-27 DIAGNOSIS — Z952 Presence of prosthetic heart valve: Secondary | ICD-10-CM

## 2016-12-27 MED ORDER — FUROSEMIDE 40 MG PO TABS: 80.0000 mg | ORAL_TABLET | Freq: Every day | ORAL | 3 refills | Status: DC

## 2016-12-27 MED ORDER — POTASSIUM CHLORIDE ER 20 MEQ PO TBCR: 40.0000 meq | EXTENDED_RELEASE_TABLET | Freq: Two times a day (BID) | ORAL | 6 refills | Status: DC

## 2016-12-27 NOTE — Progress Notes (Signed)
Cardiology Office Note    Date:  12/29/2016   ID:  [name], DOB [birth year], MRN [id number]  PCP:  [facility]  Cardiologist:  Dr. Kirk Ruths / Almyra Deforest PA-C   Chief Complaint  Patient presents with  . Follow-up    seen for Dr. Stanford Ray. s/p AVR, acute on chronic diastolic HF    History of Present Illness:  [name] is a 71 y.o. male with PMH of multiple myeloma, DM II, HTN, HLD, OSA, RLS, and aortic stenosis s/p porcine AVR 2006 and redo tissue AVR 12/2011. Cardiac catheterization prior to AVR on 11/28/2011 showed normal coronaries, severe aortic stenosis. Abdominal ultrasound in January 2017 showed no aneurysm. He has history of GI blood loss requiring multiple blood transfusions. He is also being treated for multiple myeloma. He has a TEE performed on 10/27/2015 to see if hemolysis from his valve may be contributing, this revealed a normal LV systolic function, bioprosthetic aortic valve with elevated mean gradient of 31 mmHg and moderate AI that was paravalvular. There was also mild mitral regurgitation, mild biatrial enlargement and mild right ventricular enlargement. The elevated pressure gradient was discussed with CT surgery Dr. Roxy Manns who felt the patient will be high risk for redo aortic valve replacement giving his comorbidities. Dr. Stanford Ray also discussed with Dr. Norma Fredrickson of hematology/oncology at [facility] it was felt that his anemia was multifactorial involving hemolysis, myeloma and predominantly GI loss. He was also found to be in atrial flutter during the previous office visit. Echocardiogram obtained in 08/2016 showed a vigorous LV function, AVR with mild AI, mean gradient 18 mmHg, biatrial enlargement. Holter monitor in July 2018 showed atrial flutter which was rate controlled. CTA showed no PE, CHF. He had another echocardiogram at [facility] on 10/14/2016, this showed EF 60-65%, peak aortic valve gradient 63 mmHg,  mean aortic valve gradient 30 mmHg, bioprosthetic aortic valve with diffuse thickening of the aortic valve with restricted cusp opening. He was last seen by Dr. Stanford Ray on 10/24/2016.  He was most recently admitted to the hospital on 11/18/2016 with acute shortness of breath. He received IV Lasix with significant improvement. He was discharged on 60 mg daily of Lasix. He does keep daily weight. He goes to the oncology clinic every Wednesday and Friday to receive blood transfusion. Between the 2 units of red blood cell hereceiveson Wednesday,he does receivea dose of IV Lasix. However he does not have any IV Lasix on Friday when he received 1 unit of red blood cell count. When I saw him last time, I instructed the patient to take an additional 60 mg of Lasix on days he received blood transfusion. However when he presented to the office today, he continued to have significant shortness of breath and lower extremity edema, his weight is increasing, I will increase his Lasix to 80 mg daily with instruction of taking additional 80 mg on the days he received transfusion. Otherwise he denies any significant chest discomfort. He says his gastroenterologist is planning to do a EGD.    Past Medical History:  Diagnosis Date  . Allergic rhinitis   . Anemia 2009  . Aortic stenosis    a. s/p porcine AVR 2006;  b. s/p redo tissue AVR 12/2011 (Dr. Roxy Manns) - preAVR LHC with no CAD  . Asthma   . BPH (benign prostatic hypertrophy)   . Cancer ([place])    multiple myeloma  . Cataract   . Chronic cough   .  Chronic interstitial cystitis   . Depression    pt denies  . Diabetes mellitus    type 2  . Diabetes mellitus without complication ([place])   . Diverticulosis of colon    on colonoscopy 2008  . GERD (gastroesophageal reflux disease)    bravo pH study 2008  . Gout   . H/O aortic valve replacement with porcine valve    2006, 2013  . Headache(784.0)   . Hemorrhoids    external and internal  . Hiatal hernia     . HTN (hypertension)   . Hx of echocardiogram    a. Echo  (post AVR) 12/2011:  mod LVH, EF 60-65%, Gr 2 diast dysfn, mild AI, AVR ok (mean gradient 19 mmHg), MAC, mild BAE  . Hyperlipidemia   . Hypertension   . Hyperthyroidism   . IBS (irritable bowel syndrome)   . Insomnia   . Lower GI bleed 07/16/2015  . OA (osteoarthritis)   . OSA (obstructive sleep apnea)    USES CPAP AS NEEDED  . 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 Periodontitis    chronic with bone loss  . Restless leg syndrome   . S/P aortic valve replacement with bioprosthetic valve 12/06/2011   Redo AVR using 23 mm [facility] Ease pericardial tissue valve    Past Surgical History:  Procedure Laterality Date  . AORTA - FEMORAL ARTERY BYPASS GRAFT    . AORTIC VALVE REPLACEMENT  10/13/2004   89m [place]imount pericardial tissue valve  . AORTIC VALVE REPLACEMENT  12/06/2011   Procedure: REDO AORTIC VALVE REPLACEMENT (AVR);  Surgeon: CRexene Alberts MD;  [facility]  Service: Open Heart Surgery;  Laterality: N/A;  . BUNIONECTOMY     right  . CARDIAC SURGERY     aorta vavle replacement  . CATARACT EXTRACTION  2009    &   2012   BILATERAL  . COLONOSCOPY  08/31/2011   Procedure: COLONOSCOPY;  Surgeon: RInda Castle MD;  [facility]  Service: Endoscopy;  Laterality: N/A;  . ESOPHAGOGASTRODUODENOSCOPY  08/30/2011   Procedure: ESOPHAGOGASTRODUODENOSCOPY (EGD);  Surgeon: RInda Castle MD;  [facility] [facility]  Service: Endoscopy;  Laterality: N/A;  Rm 3005   . HERNIA REPAIR    . JOINT REPLACEMENT     knee  . LEFT HEART CATHETERIZATION WITH CORONARY ANGIOGRAM N/A 11/30/2011   Procedure: LEFT HEART CATHETERIZATION WITH CORONARY ANGIOGRAM;  Surgeon: CBurnell Blanks MD;  [facility];  Service: Cardiovascular;  Laterality: N/A;  . NASAL SEPTOPLASTY W/ TURBINOPLASTY    . REFRACTIVE SURGERY     bilateral  . RIGHT HEART CATHETERIZATION Bilateral 11/30/2011    Procedure: RIGHT HEART CATH;  Surgeon: CBurnell Blanks MD;  [facility];  Service: Cardiovascular;  Laterality: Bilateral;  . right knee arthroscopy    . TEE WITHOUT CARDIOVERSION N/A 10/27/2015   Procedure: TRANSESOPHAGEAL ECHOCARDIOGRAM (TEE);  Surgeon: BLelon Perla MD;  [facility];  Service: Cardiovascular;  Laterality: N/A;    Current Medications: Outpatient Medications Prior to Visit  Medication Sig Dispense Refill  . acetaminophen (TYLENOL) 325 MG tablet Take 650 mg by mouth every 6 (six) hours as needed for fever.     .Marland Kitchenacyclovir (ZOVIRAX) 200 MG capsule Take 200 mg by mouth 2 (two) times daily.    . carbidopa-levodopa (SINEMET IR) 25-100 MG tablet Take 3 tablets by mouth 4 (four) times daily.     . cyanocobalamin 500 MCG tablet  Take 500 mcg by mouth 2 (two) times daily. Lunch/dinner    . cyclobenzaprine (FLEXERIL) 10 MG tablet Take 5 mg by mouth 3 (three) times daily as needed for muscle spasms.    Marland Kitchen dexamethasone (DECADRON) 4 MG tablet Take 10 mg by mouth 2 (two) times a week. For 3 weeks then off a week    . diazepam (VALIUM) 2 MG tablet Take 2 mg by mouth at bedtime as needed for anxiety.     . digoxin (LANOXIN) 0.125 MG tablet Take 1 tablet (0.125 mg total) by mouth daily. 30 tablet 1  . docusate sodium (COLACE) 100 MG capsule Take 100 mg by mouth 2 (two) times daily as needed for moderate constipation.     . finasteride (PROSCAR) 5 MG tablet Take 5 mg by mouth daily.    . fish oil-omega-3 fatty acids 1000 MG capsule Take 1 g by mouth daily with lunch.     . folic acid (FOLVITE) 1 MG tablet Take 1 mg by mouth daily.    Marland Kitchen gabapentin (NEURONTIN) 800 MG tablet Take 400-800 mg by mouth See admin instructions. Take 1/2 tablet every morning and at bedtime then take 1 tablet at lunch and dinner    . hydrocortisone (ANUSOL-HC) 2.5 % rectal cream Place 1 application rectally 2 (two) times daily as needed for hemorrhoids.     . ixazomib citrate (NINLARO) 3  MG capsule Take 3 mg by mouth once a week. Off on days 1, 8 and 15 of a 28 day cycle. Take on an empty stomach 1hr before or 2hrs after food. Do not crush, chew, or open.    . metFORMIN (GLUCOPHAGE) 500 MG tablet Take 500 mg by mouth 2 (two) times daily with a meal.    . methylcellulose (ARTIFICIAL TEARS) 1 % ophthalmic solution Place 1 drop into both eyes at bedtime as needed (dry eyes).    Marland Kitchen octreotide (SANDOSTATIN LAR) 20 MG injection Inject 20 mg into the muscle 2 (two) times daily.    . ondansetron (ZOFRAN) 4 MG tablet Take 4 mg by mouth every 8 (eight) hours as needed for nausea or vomiting.    . pantoprazole (PROTONIX) 40 MG tablet Take 1 tablet (40 mg total) by mouth daily. For GERD (Patient taking differently: Take 40 mg by mouth daily with lunch. For GERD) 30 tablet 3  . Polyvinyl Alcohol-Povidone PF (REFRESH) 1.4-0.6 % SOLN Place 1 drop into both eyes 2 (two) times daily as needed (dry eyes).    . simvastatin (ZOCOR) 10 MG tablet Take 10 mg by mouth at bedtime.     . traMADol (ULTRAM) 50 MG tablet Take 50 mg by mouth every 6 (six) hours as needed for moderate pain.    Marland Kitchen UNABLE TO FIND Place 1 application into the right eye daily as needed (eye care). Ocular Ointment     . zinc oxide 20 % ointment Apply 1 application topically 2 (two) times daily as needed for irritation.    . furosemide (LASIX) 40 MG tablet Take 1.5 tablets (60 mg total) by mouth daily. 135 tablet 3  . Potassium Chloride ER 20 MEQ TBCR Take 20 mEq 2 (two) times daily by mouth. 60 tablet 0   No facility-administered medications prior to visit.      Allergies:   Avodart [dutasteride]; Spironolactone; Codeine; Diazepam; Doxazosin; and Feraheme [ferumoxytol]   Social History   Socioeconomic History  . Marital status: Married    Spouse name: None  . Number of children: 0  .  Years of education: None  . Highest education level: None  Social Needs  . Financial resource strain: None  . Food insecurity - worry: None    . Food insecurity - inability: None  . Transportation needs - medical: None  . Transportation needs - non-medical: None  Occupational History  . Occupation: retired  Tobacco Use  . Smoking status: Former Smoker    Last attempt to quit: 09/30/1971    Years since quitting: 45.2  . Smokeless tobacco: Never Used  . Tobacco comment: Quit August 973  Substance and Sexual Activity  . Alcohol use: No  . Drug use: No  . Sexual activity: Not Currently  Other Topics Concern  . None  Social History Narrative   ** Merged History Encounter **         Family History:  The patient's family history includes Cancer in his maternal aunt; Heart disease in his father; Hyperlipidemia in his unknown relative; Hypertension in his sister; Osteoarthritis in his mother.   ROS:   Please see the history of present illness.    ROS All other systems reviewed and are negative.   PHYSICAL EXAM:   VS:  BP 116/66   Pulse 66   Ht _0  (1.753 m)   Wt 177 lb (80.3 kg)   BMI 26.14 kg/m    GEN: Well nourished, well developed, in no acute distress  HEENT: normal  Neck: no JVD, carotid bruits, or masses Cardiac: RRR; no rubs, or gallops   +3/6 murmur, 2+ pitting edema Respiratory:  clear to auscultation bilaterally, normal work of breathing GI: soft, nontender, nondistended, + BS MS: no deformity or atrophy  Skin: warm and dry, no rash Neuro:  Alert and Oriented x 3, Strength and sensation are intact Psych: euthymic mood, full affect  Wt Readings from Last 3 Encounters:  12/27/16 177 lb (80.3 kg)  11/29/16 167 lb 12.8 oz (76.1 kg)  11/21/16 168 lb (76.2 kg)      Studies/Labs Reviewed:   EKG:  EKG is not ordered today.    Recent Labs: 11/18/2016: ALT 11; B Natriuretic Peptide 268.9; BUN 8; Creatinine, Ser 0.50; Hemoglobin 8.6; Platelets 64; Potassium 3.5; Sodium 134   Lipid Panel    Component Value Date/Time   CHOL 133 02/12/2015 0829   TRIG 293 (H) 02/12/2015 0829   HDL 38 (L) 02/12/2015  0829   CHOLHDL 3.5 02/12/2015 0829   VLDL 59 (H) 02/12/2015 0829   LDLCALC 36 02/12/2015 0829    Additional studies/ records that were reviewed today include:   Echo 08/21/2016 LV EF: 65% - 70%  Study Conclusions  - Left ventricle: The cavity size was normal. There was mild concentric hypertrophy. Systolic function was vigorous. The estimated ejection fraction was in the range of 65% to 70%. - Ventricular septum: Septal motion showed paradox. - Aortic valve: A bioprosthesis was present. Moderate leaflet thickening and restricted motion is seen. There was mild regurgitation directed centrally in the LVOT. There was mild perivalvular regurgitation. Valve area (VTI): 1.31 cm^2. Valve area (Vmax): 1.31 cm^2. Valve area (Vmean): 1.47 cm^2. - Mitral valve: Calcified annulus. - Left atrium: The atrium was severely dilated. - Right atrium: The atrium was moderately dilated. - Pulmonary arteries: Systolic pressure was mildly increased. PA peak pressure: 41 mm Hg (S).  Impressions:  - Aortic valve gradients may be underestimated due to poor beam alignment, but there appears to be only moderate prosthetic valve stenosis.    Echo 10/14/2016 at [facility]  The left ventricular size is normal. There is moderate concentric left ventricular hypertrophy.  Left ventricular systolic function is normal. LV ejection fraction = 60-65%. No segmental wall motion abnormalities seen in the left ventricle The right ventricle is mild to moderately dilated. The right ventricular systolic function is borderline reduced. There is mild aortic regurgitation. There is moderate to severe aortic stenosis. Aortic valve mean pressure gradient is 30 mmHg. There is mild to moderate tricuspid regurgitation. Moderate pulmonary hypertension. Estimated right ventricular systolic pressure is 53 mmHg. There is no pericardial effusion. Probably no significant change in  comparison with the prior study of  09/28/2016. Transvalvular gradients are probably underestimated on the current  Study.     Port-A-Cath placement 11/16/2016 .Successful placement of a port-catheter with tip terminating in the right atrium. 2.The catheter is ready for immediate use.   ASSESSMENT:    1. Acute on chronic diastolic heart failure ([place])   2. Essential hypertension   3. Hyperlipidemia, unspecified hyperlipidemia type   4. S/P AVR (aortic valve replacement)   5. Multiple myeloma, remission status unspecified ([place])   6. Severe anemia      PLAN:  In order of problems listed above:  1. Acute on chronic diastolic heart failure: Increase Lasix to 80 mg daily with instruction of taking an additional 80 mg on the days he received blood transfusion.  2. Severe anemia: mainly related to GI loss and chronic myeloma, however may also be related to hemolysis. He has upcoming EGD.   3. Hypertension: blood pressure stable  4. Hyperlipidemia: On 20 mg daily of Zocor  5. History of bioprosthetic AVR: he already had a redo AVR, not a candidate for further surgical intervention  6. Multiple myeloma, managed by hematology/oncology. On chemotherapy via Port-A-Cath    Medication Adjustments/Labs and Tests Ordered: Current medicines are reviewed at length with the patient today.  Concerns regarding medicines are outlined above.  Medication changes, Labs and Tests ordered today are listed in the Patient Instructions below. Patient Instructions  Medication Instructions:  INCREASE Lasix to 80 mg once a day EXCEPT on dialysis days  INCREASE Potassium to 40 mg twice a day   Labwork: None   Testing/Procedures: None   Follow-Up: Keep upcoming appointment with Dr [name] as scheduled  Any Other Special Instructions Will Be Listed Below (If Applicable).  1.CALL THE OFFICE IF YOUR SWELLING DOES NOT GET BETTER 2. SEND THE OFFICE A COPY OF YOUR LABS   If you need a  refill on your cardiac medications before your next appointment, please call your pharmacy.      Hilbert Corrigan, [place]  12/29/2016 2:39 PM    [facility] [address], [place], [place]  [address] Phone: [phone]; Fax: [phone]

## 2016-12-27 NOTE — Patient Instructions (Addendum)
Medication Instructions:  INCREASE Lasix to 80 mg once a day EXCEPT on dialysis days  INCREASE Potassium to 40 mg twice a day   Labwork: None   Testing/Procedures: None   Follow-Up: Keep upcoming appointment with Dr Stanford Breed as scheduled  Any Other Special Instructions Will Be Listed Below (If Applicable).  1.CALL THE OFFICE IF YOUR SWELLING DOES NOT GET BETTER 2. SEND THE OFFICE A COPY OF YOUR LABS   If you need a refill on your cardiac medications before your next appointment, please call your pharmacy.

## 2016-12-29 ENCOUNTER — Encounter: Payer: Self-pay | Admitting: Physician Assistant

## 2017-01-12 ENCOUNTER — Other Ambulatory Visit: Payer: Self-pay | Admitting: Physician Assistant

## 2017-01-12 NOTE — Telephone Encounter (Signed)
Please review for refill, Thanks !  

## 2017-01-20 ENCOUNTER — Other Ambulatory Visit: Payer: Self-pay

## 2017-01-20 MED ORDER — POTASSIUM CHLORIDE ER 20 MEQ PO TBCR: 40.0000 meq | EXTENDED_RELEASE_TABLET | Freq: Two times a day (BID) | ORAL | 3 refills | Status: DC

## 2017-01-20 NOTE — Progress Notes (Signed)
HPI: FU AVR. Previous AVR in 2006 with a porcine valve. Echocardiogram 11/29/11: Moderate LVH, EF 64-68%, grade 1 diastolic dysfunction, critical aortic stenosis with a mean gradient of 80 mmHg. LHC 11/28/11: Normal coronary arteries, severe aortic stenosis. Patient underwent redo aortic valve replacement with a pericardial tissue valve 12/06/11 with Dr. Roxy Manns. Abdominal ultrasound January 2017 showed no aneurysm. Patient has had problems with GI blood loss and is being treated for multiple myeloma. He has required multiple blood transfusions. ATEE was performed on 10/27/2015 to see if hemolysis from his valve may be contributing. This revealed normal LV systolic function. There was a bioprosthetic aortic valve with elevated mean gradient of 31 mmHg and moderate AI that was paravalvular. There was mild mitral regurgitation, mild biatrial enlargement and mild right ventricular enlargement. I reviewed the patient with Dr. Roxy Manns. He would be high risk for redo aortic valve replacement giving his comorbidities and this would be his third valve replacement. I discussed the patient with Dr. Norma Fredrickson at [facility] who is a member of the hematology oncology department. He felt anemia was multifactorial including hemolysis, myeloma and predominantly GI blood loss. He was being evaluated for GI source and if hemolysis thought to be the major issue in the future we could consider referral to Dr. Albertine Patricia in [facility] closure of his paravalvular leak. 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  Patient found to be in atrial flutter at previous office visit. Holter 7/18 showed atrial flutter rate controlled. CTA 8/18 showed no pulmonary embolus; CHF. Echocardiogram repeated at [facility] in September 2018. Ejection fraction 60-65%, bioprosthetic aortic valve with mean gradient 30 mmHg, mild AI, moderate TR and moderate pulmonary hypertension. Multiple recent admissions for CHF and  anemia. Since last seen, he denies increased dyspnea, chest pain or syncope.  Current Outpatient Medications  Medication Sig Dispense Refill  . acetaminophen (TYLENOL) 325 MG tablet Take 650 mg by mouth every 6 (six) hours as needed for fever.     Marland Kitchen acyclovir (ZOVIRAX) 200 MG capsule Take 200 mg by mouth 2 (two) times daily.    . carbidopa-levodopa (SINEMET IR) 25-100 MG tablet Take 3 tablets by mouth 4 (four) times daily.     . cyanocobalamin 500 MCG tablet Take 500 mcg by mouth 2 (two) times daily. Lunch/dinner    . cyclobenzaprine (FLEXERIL) 10 MG tablet Take 5 mg by mouth 3 (three) times daily as needed for muscle spasms.    Marland Kitchen dexamethasone (DECADRON) 4 MG tablet Take 10 mg by mouth 2 (two) times a week. For 3 weeks then off a week    . diazepam (VALIUM) 2 MG tablet Take 2 mg by mouth at bedtime as needed for anxiety.     . diazepam (VALIUM) 2 MG tablet Take 2 mg by mouth at bedtime.    . digoxin (LANOXIN) 0.125 MG tablet Take 1 tablet (0.125 mg total) by mouth daily. 30 tablet 1  . docusate sodium (COLACE) 100 MG capsule Take 100 mg by mouth 2 (two) times daily as needed for moderate constipation.     . finasteride (PROSCAR) 5 MG tablet Take 5 mg by mouth daily.    . fish oil-omega-3 fatty acids 1000 MG capsule Take 1 g by mouth daily with lunch.     . folic acid (FOLVITE) 1 MG tablet Take 1 mg by mouth daily.    . furosemide (LASIX) 40 MG tablet Take 2 tablets (80 mg total) by mouth daily.  Take a extra tablet on days of dialysis 100 tablet 3  . gabapentin (NEURONTIN) 800 MG tablet Take 400-800 mg by mouth See admin instructions. Take 1/2 tablet every morning and at bedtime then take 1 tablet at lunch and dinner    . hydrocortisone (ANUSOL-HC) 2.5 % rectal cream Place 1 application rectally 2 (two) times daily as needed for hemorrhoids.     . ixazomib citrate (NINLARO) 3 MG capsule Take 3 mg by mouth once a week. Off on days 1, 8 and 15 of a 28 day cycle. Take on an empty stomach 1hr before  or 2hrs after food. Do not crush, chew, or open.    . metFORMIN (GLUCOPHAGE) 500 MG tablet Take 500 mg by mouth 2 (two) times daily with a meal.    . methylcellulose (ARTIFICIAL TEARS) 1 % ophthalmic solution Place 1 drop into both eyes at bedtime as needed (dry eyes).    Marland Kitchen octreotide (SANDOSTATIN LAR) 20 MG injection Inject 20 mg into the muscle 2 (two) times daily.    . ondansetron (ZOFRAN) 4 MG tablet Take 4 mg by mouth every 8 (eight) hours as needed for nausea or vomiting.    . pantoprazole (PROTONIX) 40 MG tablet Take 1 tablet (40 mg total) by mouth daily. For GERD (Patient taking differently: Take 40 mg by mouth daily with lunch. For GERD) 30 tablet 3  . Polyvinyl Alcohol-Povidone PF (REFRESH) 1.4-0.6 % SOLN Place 1 drop into both eyes 2 (two) times daily as needed (dry eyes).    . Potassium Chloride ER 20 MEQ TBCR Take 40 mEq by mouth 2 (two) times daily. 120 tablet 3  . simvastatin (ZOCOR) 10 MG tablet Take 10 mg by mouth at bedtime.     . traMADol (ULTRAM) 50 MG tablet Take 50 mg by mouth every 6 (six) hours as needed for moderate pain.    Marland Kitchen UNABLE TO FIND Place 1 application into the right eye daily as needed (eye care). Ocular Ointment     . zinc oxide 20 % ointment Apply 1 application topically 2 (two) times daily as needed for irritation.     No current facility-administered medications for this visit.      Past Medical History:  Diagnosis Date  . Allergic rhinitis   . Anemia 2009  . Aortic stenosis    a. s/p porcine AVR 2006;  b. s/p redo tissue AVR 12/2011 (Dr. Roxy Manns) - preAVR LHC with no CAD  . Asthma   . BPH (benign prostatic hypertrophy)   . Cancer ([place])    multiple myeloma  . Cataract   . Chronic cough   . Chronic interstitial cystitis   . Depression    pt denies  . Diabetes mellitus    type 2  . Diabetes mellitus without complication ([place])   . Diverticulosis of colon    on colonoscopy 2008  . GERD (gastroesophageal reflux disease)    bravo pH study 2008  .  Gout   . H/O aortic valve replacement with porcine valve    2006, 2013  . Headache(784.0)   . Hemorrhoids    external and internal  . Hiatal hernia   . HTN (hypertension)   . Hx of echocardiogram    a. Echo  (post AVR) 12/2011:  mod LVH, EF 60-65%, Gr 2 diast dysfn, mild AI, AVR ok (mean gradient 19 mmHg), MAC, mild BAE  . Hyperlipidemia   . Hypertension   . Hyperthyroidism   . IBS (irritable bowel syndrome)   .  Insomnia   . Lower GI bleed 07/16/2015  . OA (osteoarthritis)   . OSA (obstructive sleep apnea)    USES CPAP AS NEEDED  . 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 Periodontitis    chronic with bone loss  . Restless leg syndrome   . S/P aortic valve replacement with bioprosthetic valve 12/06/2011   Redo AVR using 23 mm [facility] Ease pericardial tissue valve    Past Surgical History:  Procedure Laterality Date  . AORTA - FEMORAL ARTERY BYPASS GRAFT    . AORTIC VALVE REPLACEMENT  10/13/2004   27m Edwards Perimount pericardial tissue valve  . AORTIC VALVE REPLACEMENT  12/06/2011   Procedure: REDO AORTIC VALVE REPLACEMENT (AVR);  Surgeon: CRexene Alberts MD;  [place]  Service: Open Heart Surgery;  Laterality: N/A;  . BUNIONECTOMY     right  . CARDIAC SURGERY     aorta vavle replacement  . CATARACT EXTRACTION  2009    &   2012   BILATERAL  . COLONOSCOPY  08/31/2011   Procedure: COLONOSCOPY;  Surgeon: RInda Castle MD;  [place]  Service: Endoscopy;  Laterality: N/A;  . ESOPHAGOGASTRODUODENOSCOPY  08/30/2011   Procedure: ESOPHAGOGASTRODUODENOSCOPY (EGD);  Surgeon: RInda Castle MD;  [place]  Service: Endoscopy;  Laterality: N/A;  Rm 3005   . HERNIA REPAIR    . JOINT REPLACEMENT     knee  . LEFT HEART CATHETERIZATION WITH CORONARY ANGIOGRAM N/A 11/30/2011   Procedure: LEFT HEART CATHETERIZATION WITH CORONARY ANGIOGRAM;  Surgeon: CBurnell Blanks MD;  [facility];  Service:  Cardiovascular;  Laterality: N/A;  . NASAL SEPTOPLASTY W/ TURBINOPLASTY    . REFRACTIVE SURGERY     bilateral  . RIGHT HEART CATHETERIZATION Bilateral 11/30/2011   Procedure: RIGHT HEART CATH;  Surgeon: CBurnell Blanks MD;  [facility];  Service: Cardiovascular;  Laterality: Bilateral;  . right knee arthroscopy    . TEE WITHOUT CARDIOVERSION N/A 10/27/2015   Procedure: TRANSESOPHAGEAL ECHOCARDIOGRAM (TEE);  Surgeon: BLelon Perla MD;  [facility];  Service: Cardiovascular;  Laterality: N/A;    Social History   Socioeconomic History  . Marital status: Married    Spouse name: Not on file  . Number of children: 0  . Years of education: Not on file  . Highest education level: Not on file  Social Needs  . Financial resource strain: Not on file  . Food insecurity - worry: Not on file  . Food insecurity - inability: Not on file  . Transportation needs - medical: Not on file  . Transportation needs - non-medical: Not on file  Occupational History  . Occupation: retired  Tobacco Use  . Smoking status: Former Smoker    Last attempt to quit: 09/30/1971    Years since quitting: 45.3  . Smokeless tobacco: Never Used  . Tobacco comment: Quit August 973  Substance and Sexual Activity  . Alcohol use: No  . Drug use: No  . Sexual activity: Not Currently  Other Topics Concern  . Not on file  Social History Narrative   ** Merged History Encounter **        Family History  Problem Relation Age of Onset  . Heart disease Father   . Osteoarthritis Mother   . Hypertension Sister   . Hyperlipidemia Unknown   . Cancer Maternal Aunt     ROS: no fevers or chills, productive cough, hemoptysis, dysphasia, odynophagia, melena, hematochezia,  dysuria, hematuria, rash, seizure activity, orthopnea, PND,  claudication. Remaining systems are negative.  Physical Exam: Well-developed well-nourished in no acute distress.  Skin is warm and dry.  HEENT is normal.  Neck  is supple.  Chest is clear to auscultation with normal expansion.  Cardiovascular exam is irregular, 2/6 systolic murmur Abdominal exam nontender or distended. No masses palpated. Extremities show trace edema. neuro grossly intact  A/P  1 chronic diastolic congestive heart failure-patient is doing well from a volume status. Continue present dose of Lasix. He will weigh himself daily and take an additional 40-80 mg for weight gain of 2-3 pounds. We discussed low sodium fluid restriction. Patient brought laboratories today and they were personally reviewed. His BUN yesterday was 19 and creatinine 0.49.Potassium 3.7.  2 atrial flutter-continue digoxin for rate control We have not added beta blockade or calcium blocker because of history of orthostasis. He is not a candidate for anticoagulation giving ongoing anemia/GI blood loss and requirement for repeat transfusions. We therefore cannot pursue cardioversion.  3 prior aortic valve replacement-continue SBE prophylaxis. Patient has mild aortic stenosis and aortic insufficiency on most recent echocardiogram. It should be noted previous transesophageal echocardiogram showed moderate aortic insufficiency. It was felt that his valve disease could be contributing to anemia through hemolysis. However hematology evaluated and felt that anemia was more likely related to GI blood loss and myeloma. If hemolysis is felt to contribute in the future we could consider referral to Dr. Albertine Patricia in [place] for closure of paravalvular leak.  4 anemia-felt to be secondary to GI blood loss and myeloma. Followed by hematology. Transfuse as needed.  5 multiple myeloma-Followed by oncology.  6 hyperlipidemia-continue statin.  Kirk Ruths, MD

## 2017-02-02 ENCOUNTER — Ambulatory Visit (INDEPENDENT_AMBULATORY_CARE_PROVIDER_SITE_OTHER): Payer: Medicare Other | Admitting: Cardiology

## 2017-02-02 ENCOUNTER — Encounter: Payer: Self-pay | Admitting: Cardiology

## 2017-02-02 VITALS — BP 104/72 | HR 89 | Ht 69.0 in | Wt 174.0 lb

## 2017-02-02 DIAGNOSIS — E7800 Pure hypercholesterolemia, unspecified: Secondary | ICD-10-CM

## 2017-02-02 DIAGNOSIS — I4892 Unspecified atrial flutter: Secondary | ICD-10-CM

## 2017-02-02 DIAGNOSIS — I5032 Chronic diastolic (congestive) heart failure: Secondary | ICD-10-CM | POA: Diagnosis not present

## 2017-02-02 NOTE — Patient Instructions (Signed)
Your physician recommends that you schedule a follow-up appointment in: 6 MONTHS WITH DR CRENSHAW  If you need a refill on your cardiac medications before your next appointment, please call your pharmacy.   

## 2017-02-22 DIAGNOSIS — R109 Unspecified abdominal pain: Secondary | ICD-10-CM | POA: Diagnosis not present

## 2017-04-27 DIAGNOSIS — C9000 Multiple myeloma not having achieved remission: Secondary | ICD-10-CM | POA: Diagnosis not present

## 2017-04-27 DIAGNOSIS — G893 Neoplasm related pain (acute) (chronic): Secondary | ICD-10-CM | POA: Diagnosis not present

## 2017-04-27 DIAGNOSIS — G4700 Insomnia, unspecified: Secondary | ICD-10-CM | POA: Diagnosis not present

## 2017-04-27 DIAGNOSIS — Z7189 Other specified counseling: Secondary | ICD-10-CM | POA: Diagnosis not present

## 2017-04-27 DIAGNOSIS — F419 Anxiety disorder, unspecified: Secondary | ICD-10-CM | POA: Diagnosis not present

## 2017-04-27 DIAGNOSIS — R530 Neoplastic (malignant) related fatigue: Secondary | ICD-10-CM | POA: Diagnosis not present

## 2017-04-27 DIAGNOSIS — Z515 Encounter for palliative care: Secondary | ICD-10-CM | POA: Diagnosis not present

## 2017-05-14 ENCOUNTER — Other Ambulatory Visit: Payer: Self-pay | Admitting: Physician Assistant

## 2017-05-15 NOTE — Telephone Encounter (Signed)
Please review for refill. Thanks!  

## 2017-06-04 NOTE — Progress Notes (Signed)
Cardiology Office Note   Date:  06/04/2017   ID:  [name], DOB 1945-11-12, MRN [id number]  PCP:  [facility]  Cardiologist:  Lubertha [place] No chief complaint on file.    History of Present Illness: [name] is a 72 y.o. male who presents for ongoing assessment and management of aortic valve repair, the patient had a porcine valve placed in 2006, follow-up echocardiogram on 11/29/2011 demonstrated that the patient had moderate LVH with with an EF of 60% to 65% and grade 1 diastolic dysfunction.  At that time it was revealed that the patient had critical aortic valve stenosis with a mean gradient of 80 mmHg.  Patient had a cardiac catheterization completed on 11/28/2011 revealing normal coronary arteries with severe aortic stenosis.  The patient subsequently underwent a redo aortic valve replacement with pericardial tissue valve in October 2013 by Dr. Roxy Manns.  Postoperatively the patient had GI blood loss and was being treated for multiple myeloma requiring multiple blood transfusions.    A follow-up TEE was completed on 10/27/15 to evaluate whether anemia was being caused by hemolysis from valve.  This revealed that the patient had a aortic valve gradient of 31 mmHg and was found to have moderate AI that was paravalvular.  This case was reviewed with Dr. Roxy Manns who stated that the patient would be high risk for a redo aortic valve replacement given his call normalities as this will be a 30 aortic valve replacement.  It was felt that his anemia was multifactorial including hemolysis, myeloma, and predominantly GI blood loss.  GI found that the patient had AV malformations and an ulcer.    He was also noted to have atrial flutter and a Holter monitor was placed revealing atrial flutter rate controlled.  His CT scan did not reveal any pulmonary emboli.  A follow-up echocardiogram was completed at [facility] in September 2018 which revealed a EF of 60% to 65%,  bioprosthetic aortic valve had a mean gradient of 30 mmHg, mild AI, moderate TR, and moderate pulmonary hypertension.    The patient has had multiple admissions for decompensated CHF and anemia.  He was last seen by Dr. Stanford Breed on 02/02/2017 and was without complaints at that time.  He was admitted to [facility] on 05/23/2017 in the setting of septic shock, cholecystitis s/p cholecystostomy tube, RLL pneumonia, and acute on chronic blood loss and bone-marrow suppression anemia. He receives 4 units of blood a week transfusion through [facility].   He continues to have frequent panic attacks and anxiety. His wife actually filmed one to show [place]. He states that these occur when he gets angry or upset.   Past Medical History:  Diagnosis Date  . Allergic rhinitis   . Anemia 2009  . Aortic stenosis    a. s/p porcine AVR 2006;  b. s/p redo tissue AVR 12/2011 (Dr. Roxy Manns) - preAVR LHC with no CAD  . Asthma   . BPH (benign prostatic hypertrophy)   . Cancer ([place])    multiple myeloma  . Cataract   . Chronic cough   . Chronic interstitial cystitis   . Depression    pt denies  . Diabetes mellitus    type 2  . Diabetes mellitus without complication ([place])   . Diverticulosis of colon    on colonoscopy 2008  . GERD (gastroesophageal reflux disease)    bravo pH study 2008  . Gout   . H/O aortic valve replacement with porcine valve  2006, 2013  . Headache(784.0)   . Hemorrhoids    external and internal  . Hiatal hernia   . HTN (hypertension)   . Hx of echocardiogram    a. Echo  (post AVR) 12/2011:  mod LVH, EF 60-65%, Gr 2 diast dysfn, mild AI, AVR ok (mean gradient 19 mmHg), MAC, mild BAE  . Hyperlipidemia   . Hypertension   . Hyperthyroidism   . IBS (irritable bowel syndrome)   . Insomnia   . Lower GI bleed 07/16/2015  . OA (osteoarthritis)   . OSA (obstructive sleep apnea)    USES CPAP AS NEEDED  . 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 Periodontitis    chronic  with bone loss  . Restless leg syndrome   . S/P aortic valve replacement with bioprosthetic valve 12/06/2011   Redo AVR using 23 mm [facility] Ease pericardial tissue valve    Past Surgical History:  Procedure Laterality Date  . AORTA - FEMORAL ARTERY BYPASS GRAFT    . AORTIC VALVE REPLACEMENT  10/13/2004   77m [facility] Perimount pericardial tissue valve  . AORTIC VALVE REPLACEMENT  12/06/2011   Procedure: REDO AORTIC VALVE REPLACEMENT (AVR);  Surgeon: CRexene Alberts MD;  [facility]  Service: Open Heart Surgery;  Laterality: N/A;  . BUNIONECTOMY     right  . CARDIAC SURGERY     aorta vavle replacement  . CATARACT EXTRACTION  2009    &   2012   BILATERAL  . COLONOSCOPY  08/31/2011   Procedure: COLONOSCOPY;  Surgeon: RInda Castle MD;  [facility] [facility]  Service: Endoscopy;  Laterality: N/A;  . ESOPHAGOGASTRODUODENOSCOPY  08/30/2011   Procedure: ESOPHAGOGASTRODUODENOSCOPY (EGD);  Surgeon: RInda Castle MD;  [facility]  Service: Endoscopy;  Laterality: N/A;  Rm 3005   . HERNIA REPAIR    . JOINT REPLACEMENT     knee  . LEFT HEART CATHETERIZATION WITH CORONARY ANGIOGRAM N/A 11/30/2011   Procedure: LEFT HEART CATHETERIZATION WITH CORONARY ANGIOGRAM;  Surgeon: CBurnell Blanks MD;  [facility];  Service: Cardiovascular;  Laterality: N/A;  . NASAL SEPTOPLASTY W/ TURBINOPLASTY    . REFRACTIVE SURGERY     bilateral  . RIGHT HEART CATHETERIZATION Bilateral 11/30/2011   Procedure: RIGHT HEART CATH;  Surgeon: CBurnell Blanks MD;  [facility];  Service: Cardiovascular;  Laterality: Bilateral;  . right knee arthroscopy    . TEE WITHOUT CARDIOVERSION N/A 10/27/2015   Procedure: TRANSESOPHAGEAL ECHOCARDIOGRAM (TEE);  Surgeon: BLelon Perla MD;  [facility];  Service: Cardiovascular;  Laterality: N/A;     Current Outpatient Medications  Medication Sig Dispense Refill  . acetaminophen (TYLENOL) 325 MG tablet  Take 650 mg by mouth every 6 (six) hours as needed for fever.     .Marland Kitchenacyclovir (ZOVIRAX) 200 MG capsule Take 200 mg by mouth 2 (two) times daily.    . carbidopa-levodopa (SINEMET IR) 25-100 MG tablet Take 3 tablets by mouth 4 (four) times daily.     . cyanocobalamin 500 MCG tablet Take 500 mcg by mouth 2 (two) times daily. Lunch/dinner    . cyclobenzaprine (FLEXERIL) 10 MG tablet Take 5 mg by mouth 3 (three) times daily as needed for muscle spasms.    .Marland Kitchendexamethasone (DECADRON) 4 MG tablet Take 10 mg by mouth 2 (two) times a week. For 3 weeks then off a week    . diazepam (VALIUM) 2 MG tablet Take 2 mg by mouth  at bedtime as needed for anxiety.     . diazepam (VALIUM) 2 MG tablet Take 2 mg by mouth at bedtime.    . digoxin (LANOXIN) 0.125 MG tablet Take 1 tablet (0.125 mg total) by mouth daily. 30 tablet 1  . docusate sodium (COLACE) 100 MG capsule Take 100 mg by mouth 2 (two) times daily as needed for moderate constipation.     . finasteride (PROSCAR) 5 MG tablet Take 5 mg by mouth daily.    . fish oil-omega-3 fatty acids 1000 MG capsule Take 1 g by mouth daily with lunch.     . folic acid (FOLVITE) 1 MG tablet Take 1 mg by mouth daily.    . furosemide (LASIX) 40 MG tablet TAKE 2 TABLETS (80 MG TOTAL) BY MOUTH DAILY. TAKE A EXTRA TABLET ON DAYS OF DIALYSIS 100 tablet 2  . gabapentin (NEURONTIN) 800 MG tablet Take 400-800 mg by mouth See admin instructions. Take 1/2 tablet every morning and at bedtime then take 1 tablet at lunch and dinner    . hydrocortisone (ANUSOL-HC) 2.5 % rectal cream Place 1 application rectally 2 (two) times daily as needed for hemorrhoids.     . ixazomib citrate (NINLARO) 3 MG capsule Take 3 mg by mouth once a week. Off on days 1, 8 and 15 of a 28 day cycle. Take on an empty stomach 1hr before or 2hrs after food. Do not crush, chew, or open.    . metFORMIN (GLUCOPHAGE) 500 MG tablet Take 500 mg by mouth 2 (two) times daily with a meal.    . methylcellulose (ARTIFICIAL  TEARS) 1 % ophthalmic solution Place 1 drop into both eyes at bedtime as needed (dry eyes).    Marland Kitchen octreotide (SANDOSTATIN LAR) 20 MG injection Inject 20 mg into the muscle 2 (two) times daily.    . ondansetron (ZOFRAN) 4 MG tablet Take 4 mg by mouth every 8 (eight) hours as needed for nausea or vomiting.    . pantoprazole (PROTONIX) 40 MG tablet Take 1 tablet (40 mg total) by mouth daily. For GERD (Patient taking differently: Take 40 mg by mouth daily with lunch. For GERD) 30 tablet 3  . Polyvinyl Alcohol-Povidone PF (REFRESH) 1.4-0.6 % SOLN Place 1 drop into both eyes 2 (two) times daily as needed (dry eyes).    . Potassium Chloride ER 20 MEQ TBCR Take 40 mEq by mouth 2 (two) times daily. 120 tablet 3  . simvastatin (ZOCOR) 10 MG tablet Take 10 mg by mouth at bedtime.     . traMADol (ULTRAM) 50 MG tablet Take 50 mg by mouth every 6 (six) hours as needed for moderate pain.    Marland Kitchen UNABLE TO FIND Place 1 application into the right eye daily as needed (eye care). Ocular Ointment     . zinc oxide 20 % ointment Apply 1 application topically 2 (two) times daily as needed for irritation.     No current facility-administered medications for this visit.     Allergies:   Avodart [dutasteride]; Spironolactone; Codeine; Doxazosin; and Feraheme [ferumoxytol]    Social History:  The patient  reports that he quit smoking about 45 years ago. He has never used smokeless tobacco. He reports that he does not drink alcohol or use drugs.   Family History:  The patient's family history includes Cancer in his maternal aunt; Heart disease in his father; Hyperlipidemia in his unknown relative; Hypertension in his sister; Osteoarthritis in his mother.    ROS: All other systems  are reviewed and negative. Unless otherwise mentioned in H&P    PHYSICAL EXAM: VS:  There were no vitals taken for this visit. , BMI There is no height or weight on file to calculate BMI. GEN: Well nourished, well developed, in no acute distress   HEENT: normal  Neck: no JVD, carotid bruits, or masses Cardiac: RRR; harsh 3/6 holosystolic murmur no, rubs, or gallops,no edema  Respiratory:  Clear to auscultation bilaterally. Mildly diminished in the bases without wheezes or crackles., normal work of breathing GI: soft, nontender, nondistended, + BS Drainage bag is noted from cholecystostomy tube MS: no deformity or atrophy, 2+-3+ edema bilaterally.  Skin: warm and dry, very pale  Neuro:  Strength and sensation are intact Psych: euthymic mood, full affect   EKG: Sinus rhythm with 1st degree AV Block. HR of 68 bpm.   Recent Labs: 11/18/2016: ALT 11; B Natriuretic Peptide 268.9; BUN 8; Creatinine, Ser 0.50; Hemoglobin 8.6; Platelets 64; Potassium 3.5; Sodium 134    Lipid Panel    Component Value Date/Time   CHOL 133 02/12/2015 0829   TRIG 293 (H) 02/12/2015 0829   HDL 38 (L) 02/12/2015 0829   CHOLHDL 3.5 02/12/2015 0829   VLDL 59 (H) 02/12/2015 0829   LDLCALC 36 02/12/2015 0829      Wt Readings from Last 3 Encounters:  02/02/17 174 lb (78.9 kg)  12/27/16 177 lb (80.3 kg)  11/29/16 167 lb 12.8 oz (76.1 kg)      Other studies Reviewed: Echocardiogram 08-22-2016 Left ventricle: The cavity size was normal. There was mild   concentric hypertrophy. Systolic function was vigorous. The   estimated ejection fraction was in the range of 65% to 70%. - Ventricular septum: Septal motion showed paradox. - Aortic valve: A bioprosthesis was present. Moderate leaflet   thickening and restricted motion is seen. There was mild   regurgitation directed centrally in the LVOT. There was mild   perivalvular regurgitation. Valve area (VTI): 1.31 cm^2. Valve   area (Vmax): 1.31 cm^2. Valve area (Vmean): 1.47 cm^2. - Mitral valve: Calcified annulus. - Left atrium: The atrium was severely dilated. - Right atrium: The atrium was moderately dilated. - Pulmonary arteries: Systolic pressure was mildly increased. PA   peak pressure: 41 mm Hg  (S).  ASSESSMENT AND PLAN:  1. Severe AoV stenosis: He is not a surgical candidate due to multiple medical issues.He has a perivalvular leak as well.  He is easily dyspneic and often has lower extremity edema. Last echo revealed normal systolic function and Grade I diastolic dysfunction. His BP is low and I do not wish to increase lasix as this time to prevent reduction in pre-load.   2. Chronic CHF: Multifactorial in the setting of chronic anemia, multiple myeloma, and AoV stenosis. He has significant LEE today. He has not taken his diuretic due to this office appointment. I have advised him to continue to take current dose as directed. I do not want to increase this dose at this time.   3. Multiple Myeloma:  He is being seen at [facility] and is having weekly blood transfusions of 4 units each visit. He is taking extra lasix at 80 mg BID each time he has transfusion. His wife has brought me latest labs dated 05/22/2017. Hgb 8.2/Hct 24.5. PLTS 165.   4. Hyperkalemia: Most recent labs on 05/22/2017 revealed a potassium of 7.3, concerning for hemolysis. He was taken off of potassium during recent hospitalization at [facility].   5.Chronic Cholecystitis: Has cholecystostomy  tube    Current medicines are reviewed at length with the patient today.  Due to multiple medical issues I made no changes in his regimen at this time. Can consider higher dose of lasix,but do not want to reduce preload. He is to keep appt with Dr. Stanford Breed in June.   Labs/ tests ordered today include: None drawn by [name]. Seeing them on April 30th.   [name], ANP, AACC   06/04/2017 11:41 AM    [facility] [address] [place], [place] [address] Phone: [phone]; Fax: [phone]

## 2017-06-05 ENCOUNTER — Encounter: Payer: Self-pay | Admitting: Adult Health

## 2017-06-05 ENCOUNTER — Ambulatory Visit (INDEPENDENT_AMBULATORY_CARE_PROVIDER_SITE_OTHER): Payer: Medicare Other | Admitting: Adult Health

## 2017-06-05 VITALS — BP 104/72 | HR 68 | Ht 69.0 in | Wt 176.0 lb

## 2017-06-05 DIAGNOSIS — C9000 Multiple myeloma not having achieved remission: Secondary | ICD-10-CM

## 2017-06-05 DIAGNOSIS — Z952 Presence of prosthetic heart valve: Secondary | ICD-10-CM | POA: Diagnosis not present

## 2017-06-05 DIAGNOSIS — I5032 Chronic diastolic (congestive) heart failure: Secondary | ICD-10-CM | POA: Diagnosis not present

## 2017-06-05 NOTE — Patient Instructions (Signed)
Medication Instructions:  NO CHANGES- Your physician recommends that you continue on your current medications as directed. Please refer to the Current Medication list given to you today.  If you need a refill on your cardiac medications before your next appointment, please call your pharmacy.  Follow-Up: Your physician wants you to follow-up in: KEEP SCHEDULED APPT WITH DR CRENSHAW.   Thank you for choosing [facility]!!       

## 2017-06-06 NOTE — Addendum Note (Signed)
Addended by: Gearlene Godsil L on: 06/06/2017 04:47 PM   Modules accepted: Orders  

## 2017-06-08 DIAGNOSIS — K810 Acute cholecystitis: Secondary | ICD-10-CM | POA: Diagnosis not present

## 2017-06-23 ENCOUNTER — Other Ambulatory Visit: Payer: Self-pay

## 2017-06-23 ENCOUNTER — Encounter (HOSPITAL_COMMUNITY): Payer: Self-pay | Admitting: Emergency Medicine

## 2017-06-23 ENCOUNTER — Inpatient Hospital Stay (HOSPITAL_COMMUNITY)
Admission: EM | Admit: 2017-06-23 | Discharge: 2017-06-29 | DRG: 811 | Disposition: A | Discharge status: Home / Self Care | Payer: Medicare Other | Attending: Internal Medicine | Admitting: Internal Medicine

## 2017-06-23 ENCOUNTER — Emergency Department (HOSPITAL_COMMUNITY): Payer: Medicare Other

## 2017-06-23 DIAGNOSIS — I252 Old myocardial infarction: Secondary | ICD-10-CM

## 2017-06-23 DIAGNOSIS — H532 Diplopia: Secondary | ICD-10-CM | POA: Diagnosis present

## 2017-06-23 DIAGNOSIS — G2581 Restless legs syndrome: Secondary | ICD-10-CM | POA: Diagnosis present

## 2017-06-23 DIAGNOSIS — R531 Weakness: Secondary | ICD-10-CM | POA: Diagnosis not present

## 2017-06-23 DIAGNOSIS — I248 Other forms of acute ischemic heart disease: Secondary | ICD-10-CM

## 2017-06-23 DIAGNOSIS — K922 Gastrointestinal hemorrhage, unspecified: Secondary | ICD-10-CM | POA: Diagnosis not present

## 2017-06-23 DIAGNOSIS — K811 Chronic cholecystitis: Secondary | ICD-10-CM | POA: Diagnosis not present

## 2017-06-23 DIAGNOSIS — C9000 Multiple myeloma not having achieved remission: Secondary | ICD-10-CM | POA: Diagnosis present

## 2017-06-23 DIAGNOSIS — E119 Type 2 diabetes mellitus without complications: Secondary | ICD-10-CM | POA: Diagnosis present

## 2017-06-23 DIAGNOSIS — G4733 Obstructive sleep apnea (adult) (pediatric): Secondary | ICD-10-CM | POA: Diagnosis present

## 2017-06-23 DIAGNOSIS — E0590 Thyrotoxicosis, unspecified without thyrotoxic crisis or storm: Secondary | ICD-10-CM | POA: Diagnosis present

## 2017-06-23 DIAGNOSIS — D696 Thrombocytopenia, unspecified: Secondary | ICD-10-CM | POA: Diagnosis not present

## 2017-06-23 DIAGNOSIS — J811 Chronic pulmonary edema: Secondary | ICD-10-CM | POA: Diagnosis not present

## 2017-06-23 DIAGNOSIS — E43 Unspecified severe protein-calorie malnutrition: Secondary | ICD-10-CM | POA: Diagnosis not present

## 2017-06-23 DIAGNOSIS — Z6825 Body mass index (BMI) 25.0-25.9, adult: Secondary | ICD-10-CM

## 2017-06-23 DIAGNOSIS — E871 Hypo-osmolality and hyponatremia: Secondary | ICD-10-CM | POA: Diagnosis not present

## 2017-06-23 DIAGNOSIS — K589 Irritable bowel syndrome without diarrhea: Secondary | ICD-10-CM | POA: Diagnosis present

## 2017-06-23 DIAGNOSIS — I5032 Chronic diastolic (congestive) heart failure: Secondary | ICD-10-CM | POA: Diagnosis present

## 2017-06-23 DIAGNOSIS — E118 Type 2 diabetes mellitus with unspecified complications: Secondary | ICD-10-CM | POA: Diagnosis not present

## 2017-06-23 DIAGNOSIS — Z515 Encounter for palliative care: Secondary | ICD-10-CM | POA: Diagnosis not present

## 2017-06-23 DIAGNOSIS — N400 Enlarged prostate without lower urinary tract symptoms: Secondary | ICD-10-CM | POA: Diagnosis present

## 2017-06-23 DIAGNOSIS — Z7984 Long term (current) use of oral hypoglycemic drugs: Secondary | ICD-10-CM

## 2017-06-23 DIAGNOSIS — R110 Nausea: Secondary | ICD-10-CM | POA: Diagnosis not present

## 2017-06-23 DIAGNOSIS — T8203XA Leakage of heart valve prosthesis, initial encounter: Secondary | ICD-10-CM | POA: Diagnosis present

## 2017-06-23 DIAGNOSIS — R578 Other shock: Secondary | ICD-10-CM | POA: Diagnosis present

## 2017-06-23 DIAGNOSIS — T82857A Stenosis of cardiac prosthetic devices, implants and grafts, initial encounter: Secondary | ICD-10-CM | POA: Diagnosis present

## 2017-06-23 DIAGNOSIS — E861 Hypovolemia: Secondary | ICD-10-CM | POA: Diagnosis not present

## 2017-06-23 DIAGNOSIS — Z7189 Other specified counseling: Secondary | ICD-10-CM | POA: Diagnosis not present

## 2017-06-23 DIAGNOSIS — Z888 Allergy status to other drugs, medicaments and biological substances status: Secondary | ICD-10-CM

## 2017-06-23 DIAGNOSIS — I6789 Other cerebrovascular disease: Secondary | ICD-10-CM | POA: Diagnosis not present

## 2017-06-23 DIAGNOSIS — I110 Hypertensive heart disease with heart failure: Secondary | ICD-10-CM | POA: Diagnosis present

## 2017-06-23 DIAGNOSIS — I9589 Other hypotension: Secondary | ICD-10-CM | POA: Diagnosis not present

## 2017-06-23 DIAGNOSIS — E785 Hyperlipidemia, unspecified: Secondary | ICD-10-CM | POA: Diagnosis present

## 2017-06-23 DIAGNOSIS — R404 Transient alteration of awareness: Secondary | ICD-10-CM | POA: Diagnosis not present

## 2017-06-23 DIAGNOSIS — D500 Iron deficiency anemia secondary to blood loss (chronic): Principal | ICD-10-CM | POA: Diagnosis present

## 2017-06-23 DIAGNOSIS — K219 Gastro-esophageal reflux disease without esophagitis: Secondary | ICD-10-CM | POA: Diagnosis present

## 2017-06-23 DIAGNOSIS — K5900 Constipation, unspecified: Secondary | ICD-10-CM | POA: Diagnosis not present

## 2017-06-23 DIAGNOSIS — D61818 Other pancytopenia: Secondary | ICD-10-CM | POA: Diagnosis present

## 2017-06-23 DIAGNOSIS — I350 Nonrheumatic aortic (valve) stenosis: Secondary | ICD-10-CM | POA: Diagnosis not present

## 2017-06-23 DIAGNOSIS — K5520 Angiodysplasia of colon without hemorrhage: Secondary | ICD-10-CM | POA: Diagnosis present

## 2017-06-23 DIAGNOSIS — Y831 Surgical operation with implant of artificial internal device as the cause of abnormal reaction of the patient, or of later complication, without mention of misadventure at the time of the procedure: Secondary | ICD-10-CM | POA: Diagnosis present

## 2017-06-23 DIAGNOSIS — Z87891 Personal history of nicotine dependence: Secondary | ICD-10-CM | POA: Diagnosis not present

## 2017-06-23 DIAGNOSIS — M109 Gout, unspecified: Secondary | ICD-10-CM | POA: Diagnosis present

## 2017-06-23 DIAGNOSIS — D649 Anemia, unspecified: Secondary | ICD-10-CM | POA: Diagnosis not present

## 2017-06-23 DIAGNOSIS — Y712 Prosthetic and other implants, materials and accessory cardiovascular devices associated with adverse incidents: Secondary | ICD-10-CM | POA: Diagnosis present

## 2017-06-23 DIAGNOSIS — Z953 Presence of xenogenic heart valve: Secondary | ICD-10-CM

## 2017-06-23 DIAGNOSIS — Z885 Allergy status to narcotic agent status: Secondary | ICD-10-CM

## 2017-06-23 DIAGNOSIS — Z79899 Other long term (current) drug therapy: Secondary | ICD-10-CM

## 2017-06-23 HISTORY — DX: Multiple myeloma not having achieved remission: C90.00

## 2017-06-23 LAB — COMPREHENSIVE METABOLIC PANEL
ALK PHOS: 96 U/L (ref 38–126)
ALT: 13 U/L — AB (ref 17–63)
AST: 45 U/L — AB (ref 15–41)
Albumin: 2.8 g/dL — ABNORMAL LOW (ref 3.5–5.0)
Anion gap: 13 (ref 5–15)
BUN: 24 mg/dL — AB (ref 6–20)
CALCIUM: 9.4 mg/dL (ref 8.9–10.3)
CO2: 22 mmol/L (ref 22–32)
CREATININE: 0.52 mg/dL — AB (ref 0.61–1.24)
Chloride: 97 mmol/L — ABNORMAL LOW (ref 101–111)
GFR calc Af Amer: >60 mL/min (ref >60)
Glucose, Bld: 129 mg/dL — ABNORMAL HIGH (ref 65–99)
Potassium: 3.6 mmol/L (ref 3.5–5.1)
Sodium: 132 mmol/L — ABNORMAL LOW (ref 135–145)
TOTAL PROTEIN: 5.3 g/dL — AB (ref 6.5–8.1)
Total Bilirubin: 3.6 mg/dL — ABNORMAL HIGH (ref 0.3–1.2)

## 2017-06-23 LAB — URINALYSIS, ROUTINE W REFLEX MICROSCOPIC
Bilirubin Urine: NEGATIVE
GLUCOSE, UA: NEGATIVE mg/dL
KETONES UR: 5 mg/dL — AB
Leukocytes, UA: NEGATIVE
Nitrite: NEGATIVE
PH: 5 (ref 5.0–8.0)
PROTEIN: NEGATIVE mg/dL
Specific Gravity, Urine: 1.021 (ref 1.005–1.030)

## 2017-06-23 LAB — MRSA PCR SCREENING: MRSA BY PCR: NEGATIVE

## 2017-06-23 LAB — CBC WITH DIFFERENTIAL/PLATELET
BASOS PCT: 0 %
Basophils Absolute: 0 10*3/uL (ref 0.0–0.1)
EOS ABS: 0 10*3/uL (ref 0.0–0.7)
EOS PCT: 0 %
HCT: 18.8 % — ABNORMAL LOW (ref 39.0–52.0)
Hemoglobin: 5.7 g/dL — CL (ref 13.0–17.0)
Lymphocytes Relative: 6 %
Lymphs Abs: 0.4 10*3/uL — ABNORMAL LOW (ref 0.7–4.0)
MCH: 29.4 pg (ref 26.0–34.0)
MCHC: 30.3 g/dL (ref 30.0–36.0)
MCV: 96.9 fL (ref 78.0–100.0)
MONO ABS: 0.5 10*3/uL (ref 0.1–1.0)
Monocytes Relative: 8 %
NEUTROS ABS: 5.8 10*3/uL (ref 1.7–7.7)
Neutrophils Relative %: 86 %
PLATELETS: 124 10*3/uL — AB (ref 150–400)
RBC: 1.94 MIL/uL — ABNORMAL LOW (ref 4.22–5.81)
RDW: 25.3 % — ABNORMAL HIGH (ref 11.5–15.5)
WBC: 6.7 10*3/uL (ref 4.0–10.5)

## 2017-06-23 LAB — CBC
HCT: 19.5 % — ABNORMAL LOW (ref 39.0–52.0)
HEMATOCRIT: 23.4 % — AB (ref 39.0–52.0)
HEMOGLOBIN: 5.9 g/dL — AB (ref 13.0–17.0)
HEMOGLOBIN: 7.4 g/dL — AB (ref 13.0–17.0)
MCH: 29.6 pg (ref 26.0–34.0)
MCH: 29.7 pg (ref 26.0–34.0)
MCHC: 30.3 g/dL (ref 30.0–36.0)
MCHC: 31.6 g/dL (ref 30.0–36.0)
MCV: 98 fL (ref 78.0–100.0)
MCV: 94 fL (ref 78.0–100.0)
Platelets: 154 10*3/uL (ref 150–400)
Platelets: 121 10*3/uL — ABNORMAL LOW (ref 150–400)
RBC: 1.99 MIL/uL — AB (ref 4.22–5.81)
RBC: 2.49 MIL/uL — AB (ref 4.22–5.81)
RDW: 25.4 % — ABNORMAL HIGH (ref 11.5–15.5)
RDW: 23.1 % — ABNORMAL HIGH (ref 11.5–15.5)
WBC: 7.8 10*3/uL (ref 4.0–10.5)
WBC: 6.2 10*3/uL (ref 4.0–10.5)

## 2017-06-23 LAB — PREPARE RBC (CROSSMATCH)

## 2017-06-23 LAB — BRAIN NATRIURETIC PEPTIDE: B NATRIURETIC PEPTIDE 5: 1197.3 pg/mL — AB (ref 0.0–100.0)

## 2017-06-23 LAB — CBG MONITORING, ED
Glucose-Capillary: 144 mg/dL — ABNORMAL HIGH (ref 65–99)
Glucose-Capillary: 140 mg/dL — ABNORMAL HIGH (ref 65–99)

## 2017-06-23 LAB — TROPONIN I
TROPONIN I: 0.49 ng/mL — AB (ref <0.03)
Troponin I: 0.16 ng/mL (ref <0.03)
Troponin I: 3.1 ng/mL (ref <0.03)

## 2017-06-23 MED ORDER — TRAMADOL HCL 50 MG PO TABS
50.0000 mg | ORAL_TABLET | Freq: Four times a day (QID) | ORAL | Status: DC | PRN
  Administered 2017-06-23 – 2017-06-28 (×7): 50 mg via ORAL
  Filled 2017-06-23 (×7): qty 1

## 2017-06-23 MED ORDER — MORPHINE SULFATE 15 MG PO TABS
7.5000 mg | ORAL_TABLET | ORAL | Status: DC | PRN
  Administered 2017-06-28: 7.5 mg via ORAL
  Filled 2017-06-23: qty 1

## 2017-06-23 MED ORDER — SODIUM CHLORIDE 0.9 % IV SOLN: 250.0000 mL | INTRAVENOUS | Status: DC | PRN

## 2017-06-23 MED ORDER — POLYVINYL ALCOHOL 1.4 % OP SOLN: 1.0000 [drp] | Freq: Two times a day (BID) | OPHTHALMIC | Status: DC | PRN

## 2017-06-23 MED ORDER — SODIUM CHLORIDE 0.9 % IV SOLN
10.0000 mL/h | Freq: Once | INTRAVENOUS | Status: AC
Start: 2017-06-23 — End: 2017-06-23
  Administered 2017-06-23: 10 mL/h via INTRAVENOUS

## 2017-06-23 MED ORDER — TRAZODONE HCL 50 MG PO TABS
25.00 | ORAL_TABLET | ORAL | Status: DC
Start: ? — End: 2017-06-23

## 2017-06-23 MED ORDER — FINASTERIDE 5 MG PO TABS
5.00 | ORAL_TABLET | ORAL | Status: DC
Start: 2017-06-22 — End: 2017-06-23

## 2017-06-23 MED ORDER — ONDANSETRON HCL 4 MG/2ML IJ SOLN
4.0000 mg | Freq: Once | INTRAMUSCULAR | Status: AC
  Administered 2017-06-23: 4 mg via INTRAVENOUS
  Filled 2017-06-23: qty 2

## 2017-06-23 MED ORDER — DOCUSATE SODIUM 100 MG PO CAPS
100.00 | ORAL_CAPSULE | ORAL | Status: DC
Start: ? — End: 2017-06-23

## 2017-06-23 MED ORDER — HYDROCORTISONE 2.5 % RE CREA
TOPICAL_CREAM | RECTAL | Status: DC
Start: ? — End: 2017-06-23

## 2017-06-23 MED ORDER — SODIUM CHLORIDE 0.9 % IV SOLN
8.0000 mg/h | INTRAVENOUS | Status: AC
  Administered 2017-06-23 – 2017-06-25 (×5): 8 mg/h via INTRAVENOUS
  Filled 2017-06-23 (×8): qty 80

## 2017-06-23 MED ORDER — GABAPENTIN 400 MG PO CAPS
400.0000 mg | ORAL_CAPSULE | Freq: Two times a day (BID) | ORAL | Status: DC
  Administered 2017-06-23 – 2017-06-24 (×2): 400 mg via ORAL
  Filled 2017-06-23 (×2): qty 1

## 2017-06-23 MED ORDER — SORBITOL 70 % RE SOLN
30.00 | RECTAL | Status: DC
Start: ? — End: 2017-06-23

## 2017-06-23 MED ORDER — INSULIN ASPART 100 UNIT/ML ~~LOC~~ SOLN
0.0000 [IU] | Freq: Four times a day (QID) | SUBCUTANEOUS | Status: DC
  Administered 2017-06-23 – 2017-06-25 (×4): 1 [IU] via SUBCUTANEOUS
  Filled 2017-06-23: qty 1

## 2017-06-23 MED ORDER — FOLIC ACID 1 MG PO TABS
1.00 | ORAL_TABLET | ORAL | Status: DC
Start: 2017-06-22 — End: 2017-06-23

## 2017-06-23 MED ORDER — ATORVASTATIN CALCIUM 10 MG PO TABS
10.00 | ORAL_TABLET | ORAL | Status: DC
Start: 2017-06-21 — End: 2017-06-23

## 2017-06-23 MED ORDER — GABAPENTIN 400 MG PO CAPS
800.0000 mg | ORAL_CAPSULE | Freq: Two times a day (BID) | ORAL | Status: DC
  Administered 2017-06-24: 800 mg via ORAL
  Filled 2017-06-23: qty 2

## 2017-06-23 MED ORDER — MORPHINE SULFATE 15 MG PO TABS
7.50 | ORAL_TABLET | ORAL | Status: DC
Start: ? — End: 2017-06-23

## 2017-06-23 MED ORDER — ENOXAPARIN SODIUM 40 MG/0.4ML ~~LOC~~ SOLN
40.00 | SUBCUTANEOUS | Status: DC
Start: 2017-06-22 — End: 2017-06-23

## 2017-06-23 MED ORDER — ACETAMINOPHEN 650 MG RE SUPP: 650.0000 mg | Freq: Four times a day (QID) | RECTAL | Status: DC | PRN

## 2017-06-23 MED ORDER — POLYETHYL GLYCOL-PROPYL GLYCOL 0.4-0.3 % OP SOLN
2.00 | OPHTHALMIC | Status: DC
Start: ? — End: 2017-06-23

## 2017-06-23 MED ORDER — CARBIDOPA-LEVODOPA 25-100 MG PO TABS
2.00 | ORAL_TABLET | ORAL | Status: DC
Start: 2017-06-21 — End: 2017-06-23

## 2017-06-23 MED ORDER — VITAMIN B-12 1000 MCG PO TABS
1000.00 | ORAL_TABLET | ORAL | Status: DC
Start: 2017-06-21 — End: 2017-06-23

## 2017-06-23 MED ORDER — POLYVINYL ALCOHOL-POVIDONE PF 1.4-0.6 % OP SOLN: 1.0000 [drp] | Freq: Two times a day (BID) | OPHTHALMIC | Status: DC | PRN

## 2017-06-23 MED ORDER — ONDANSETRON HCL 4 MG PO TABS
4.0000 mg | ORAL_TABLET | Freq: Three times a day (TID) | ORAL | Status: DC | PRN
  Administered 2017-06-23 (×2): 4 mg via ORAL
  Filled 2017-06-23 (×3): qty 1

## 2017-06-23 MED ORDER — FUROSEMIDE 10 MG/ML IJ SOLN
20.0000 mg | Freq: Once | INTRAMUSCULAR | Status: AC
  Administered 2017-06-23: 20 mg via INTRAVENOUS
  Filled 2017-06-23: qty 2

## 2017-06-23 MED ORDER — FINASTERIDE 5 MG PO TABS
5.0000 mg | ORAL_TABLET | Freq: Every day | ORAL | Status: DC
  Administered 2017-06-24 – 2017-06-29 (×6): 5 mg via ORAL
  Filled 2017-06-23 (×6): qty 1

## 2017-06-23 MED ORDER — SODIUM CHLORIDE 0.9% FLUSH
3.0000 mL | Freq: Two times a day (BID) | INTRAVENOUS | Status: DC
  Administered 2017-06-24: 3 mL via INTRAVENOUS

## 2017-06-23 MED ORDER — PANTOPRAZOLE SODIUM 40 MG IV SOLR: 40.0000 mg | Freq: Two times a day (BID) | INTRAVENOUS | Status: DC

## 2017-06-23 MED ORDER — TRAMADOL HCL 50 MG PO TABS
50.0000 mg | ORAL_TABLET | Freq: Once | ORAL | Status: AC
  Administered 2017-06-23: 50 mg via ORAL
  Filled 2017-06-23: qty 1

## 2017-06-23 MED ORDER — BISACODYL 5 MG PO TBEC
10.00 | DELAYED_RELEASE_TABLET | ORAL | Status: DC
Start: ? — End: 2017-06-23

## 2017-06-23 MED ORDER — SODIUM CHLORIDE 0.9 % IV SOLN
80.0000 mg | Freq: Once | INTRAVENOUS | Status: AC
  Administered 2017-06-23: 80 mg via INTRAVENOUS
  Filled 2017-06-23: qty 80

## 2017-06-23 MED ORDER — TRAZODONE HCL 50 MG PO TABS
50.0000 mg | ORAL_TABLET | Freq: Every day | ORAL | Status: DC
  Administered 2017-06-23 – 2017-06-28 (×5): 50 mg via ORAL
  Filled 2017-06-23 (×5): qty 1

## 2017-06-23 MED ORDER — SODIUM CHLORIDE 0.9 % IV SOLN: Freq: Once | INTRAVENOUS | Status: DC

## 2017-06-23 MED ORDER — SODIUM CHLORIDE 0.9 % IV SOLN
250.00 | INTRAVENOUS | Status: DC
Start: ? — End: 2017-06-23

## 2017-06-23 MED ORDER — SODIUM CHLORIDE 0.9% FLUSH
10.0000 mL | INTRAVENOUS | Status: DC | PRN
Start: 2017-06-23 — End: 2017-06-29
  Administered 2017-06-24 – 2017-06-29 (×2): 10 mL
  Filled 2017-06-23 (×2): qty 40

## 2017-06-23 MED ORDER — ACETAMINOPHEN 325 MG PO TABS
650.00 | ORAL_TABLET | ORAL | Status: DC
Start: ? — End: 2017-06-23

## 2017-06-23 MED ORDER — SODIUM CHLORIDE 0.9% FLUSH
3.0000 mL | Freq: Two times a day (BID) | INTRAVENOUS | Status: DC
  Administered 2017-06-25 – 2017-06-26 (×3): 3 mL via INTRAVENOUS

## 2017-06-23 MED ORDER — SODIUM CHLORIDE 0.9% FLUSH: 3.0000 mL | INTRAVENOUS | Status: DC | PRN

## 2017-06-23 MED ORDER — ONDANSETRON HCL 4 MG PO TABS
4.00 | ORAL_TABLET | ORAL | Status: DC
Start: ? — End: 2017-06-23

## 2017-06-23 MED ORDER — PANTOPRAZOLE SODIUM 40 MG PO TBEC
40.00 | DELAYED_RELEASE_TABLET | ORAL | Status: DC
Start: 2017-06-22 — End: 2017-06-23

## 2017-06-23 MED ORDER — CARBIDOPA-LEVODOPA 25-100 MG PO TABS
1.0000 | ORAL_TABLET | Freq: Three times a day (TID) | ORAL | Status: DC
  Administered 2017-06-23 – 2017-06-29 (×18): 1 via ORAL
  Filled 2017-06-23 (×19): qty 1

## 2017-06-23 MED ORDER — ACETAMINOPHEN 325 MG PO TABS
650.0000 mg | ORAL_TABLET | Freq: Four times a day (QID) | ORAL | Status: DC | PRN
  Administered 2017-06-23 – 2017-06-26 (×3): 650 mg via ORAL
  Administered 2017-06-27: 325 mg via ORAL
  Administered 2017-06-28: 650 mg via ORAL
  Filled 2017-06-23 (×6): qty 2

## 2017-06-23 MED ORDER — FUROSEMIDE 20 MG PO TABS
60.00 | ORAL_TABLET | ORAL | Status: DC
Start: 2017-06-21 — End: 2017-06-23

## 2017-06-23 MED ORDER — PHENYLEPHRINE-MINERAL OIL-PET 0.25-14-74.9 % RE OINT
TOPICAL_OINTMENT | RECTAL | Status: DC
Start: ? — End: 2017-06-23

## 2017-06-23 NOTE — ED Notes (Signed)
Report called to Elmyra Ricks, RN on 5W; ready to accept pt

## 2017-06-23 NOTE — Consult Note (Addendum)
Cardiology Consultation:   Patient ID: [name]; [id number]; 16-Jun-1945   Admit date: 06/23/2017 Date of Consult: 06/23/2017  Primary Care Provider: [facility], [facility] Primary Cardiologist: Dr Stanford Breed, 02/02/2017 K. Purcell Nails, [place] 06/05/2017 Primary Electrophysiologist:  n/a   Patient Profile:   [name] is a 72 y.o. 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  06/05/2017 office visit, pt w/ LE edema but BP too low to increase Lasix (which he had not taken that day).   Admitted 05/10-05/15 to [facility] in [place] for CHF exacerbation (wt 168 lbs at d/c), anemia req transfusions (H&H 05/15 7.0/20.0), thrombocytopenia w/ plt 72 at d/c, dyskinesia ?2nd Sinemet>>taper and d/c planned, electrolytes replaced.  [name] is being seen today for the evaluation of elevated troponin at the request of Dr. Sarajane Jews.  History of Present Illness:   [name] has not felt well since being discharged from the hospital.  He has been extremely weak, no specific complaints of pain.  He has not felt more short of breath than usual, he has not had more lower extremity edema than usual.  He has not had chest pain.  Per his wife, the patient normally is quite resistant to coming to the hospital.  However, early this morning, he told her to call.  This is unusual and worrisome to her.  In the ER, he was noted to have H&H 5.7/18.8, BNP 1197, total bilirubin 3.6, troponin initially 0.16, now 0.49. Other lab abnormalities less extreme.  He is currently receiving his second transfusion.  He has not had chest pain.  He has had dyspnea on exertion.  He is not weighing daily.  He has orthopnea. Has not slept well, not sure about PND.  He has not had fevers or chills.  His stools are black but that  is not new or different, so he could not really tell any difference.  He is currently nauseated, and that is a new feeling for him.  He has not vomited.  He has chronic lower extremity edema and does not feel that it is any different.  He just says that he feels terrible.  The main complaint that he can describe is weakness.    Past Medical History:  Diagnosis Date  . Allergic rhinitis   . Anemia 2009  . Aortic stenosis    a. s/p porcine AVR 2006;  b. s/p redo tissue AVR 12/2011 (Dr. Roxy Manns) - preAVR LHC with no CAD  . Asthma   . BPH (benign prostatic hypertrophy)   . Cancer ([place])    multiple myeloma  . Cataract   . Chronic cough   . Chronic interstitial cystitis   . Depression    pt denies  . Diabetes mellitus    type 2  . Diabetes mellitus without complication ([place])   . Diverticulosis of colon    on colonoscopy 2008  . GERD (gastroesophageal reflux disease)    bravo pH study 2008  . Gout   . H/O aortic valve replacement with porcine valve    2006, 2013  . Headache(784.0)   . Hemorrhoids    external and internal  . Hiatal hernia   . HTN (hypertension)   . Hx of echocardiogram    a. Echo  (post AVR) 12/2011:  mod LVH, EF 60-65%, Gr 2 diast dysfn, mild AI, AVR ok (mean gradient 19 mmHg), MAC, mild BAE  . Hyperlipidemia   . Hypertension   . Hyperthyroidism   . IBS (irritable bowel syndrome)   . Insomnia   . Lower GI bleed 07/16/2015  . Multiple myeloma ([place])   . OA (osteoarthritis)   . OSA (obstructive sleep apnea)    USES CPAP AS NEEDED  . 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 Periodontitis    chronic with bone loss  . Restless leg syndrome   . S/P aortic valve replacement with bioprosthetic valve 12/06/2011   Redo AVR using 23 mm [facility] Ease pericardial tissue valve    Past Surgical History:  Procedure Laterality Date  . AORTA - FEMORAL ARTERY BYPASS GRAFT    . AORTIC VALVE REPLACEMENT  10/13/2004   22m Edwards Perimount  pericardial tissue valve  . AORTIC VALVE REPLACEMENT  12/06/2011   Procedure: REDO AORTIC VALVE REPLACEMENT (AVR);  Surgeon: CRexene Alberts MD;  [place] [facility]  Service: Open Heart Surgery;  Laterality: N/A;  . BUNIONECTOMY     right  . CARDIAC SURGERY     aorta vavle replacement  . CATARACT EXTRACTION  2009    &   2012   BILATERAL  . COLONOSCOPY  08/31/2011   Procedure: COLONOSCOPY;  Surgeon: RInda Castle MD;  [place] [facility]  Service: Endoscopy;  Laterality: N/A;  . ESOPHAGOGASTRODUODENOSCOPY  08/30/2011   Procedure: ESOPHAGOGASTRODUODENOSCOPY (EGD);  Surgeon: RInda Castle MD;  [place] [facility]  Service: Endoscopy;  Laterality: N/A;  Rm 3005   . HERNIA REPAIR    . LEFT HEART CATHETERIZATION WITH CORONARY ANGIOGRAM N/A 11/30/2011   Procedure: LEFT HEART CATHETERIZATION WITH CORONARY ANGIOGRAM;  Surgeon: CBurnell Blanks MD;  [facility];  Service: Cardiovascular;  Laterality: N/A;  . NASAL SEPTOPLASTY W/ TURBINOPLASTY    . REFRACTIVE SURGERY     bilateral  . RIGHT HEART CATHETERIZATION Bilateral 11/30/2011   Procedure: RIGHT HEART CATH;  Surgeon: CBurnell Blanks MD;  [facility];  Service: Cardiovascular;  Laterality: Bilateral;  . right knee arthroscopy    . TEE WITHOUT CARDIOVERSION N/A 10/27/2015   Procedure: TRANSESOPHAGEAL ECHOCARDIOGRAM (TEE);  Surgeon: BLelon Perla MD;  [facility];  Service: Cardiovascular;  Laterality: N/A;     Prior to Admission medications   Medication Sig Start Date End Date Taking? Authorizing Provider  acetaminophen (TYLENOL) 325 MG tablet Take 650 mg by mouth every 6 (six) hours as needed for fever.    Yes [provider]  carbidopa-levodopa (SINEMET IR) 25-100 MG tablet Take 1 tablet by mouth 3 (three) times daily.    Yes [provider]  cyanocobalamin 500 MCG tablet Take 500 mcg by mouth 2 (two) times daily. Lunch/dinner   Yes [provider]    docusate sodium (COLACE) 100 MG capsule Take 100 mg by mouth 2 (two) times daily as needed for moderate constipation.  08/20/15  Yes [provider]  finasteride (PROSCAR) 5 MG tablet Take 5 mg by mouth daily.   Yes [provider]  fish oil-omega-3 fatty acids 1000 MG capsule Take 1 g by mouth daily with lunch.    Yes [provider]  folic acid (FOLVITE) 1 MG tablet Take 1 mg by mouth daily. 09/24/15  Yes [provider]  furosemide (LASIX) 40 MG tablet TAKE 2 TABLETS (80 MG TOTAL) BY MOUTH DAILY. TAKE A  EXTRA TABLET ON DAYS OF DIALYSIS Patient taking differently: Take 60 mg by mouth 2 (two) times daily.  05/15/17  Yes Almyra Deforest, PA  gabapentin (NEURONTIN) 800 MG tablet Take 400-800 mg by mouth See admin instructions. Take 1/2 tablet every morning and at bedtime then take 1 tablet at lunch and dinner   Yes [provider]  ixazomib citrate (NINLARO) 3 MG capsule Take 3 mg by mouth once a week. Off on days 1, 8 and 15 of a 28 day cycle. Take on an empty stomach 1hr before or 2hrs after food. Do not crush, chew, or open.   Yes [provider]  metFORMIN (GLUCOPHAGE) 500 MG tablet Take 500 mg by mouth 2 (two) times daily with a meal.   Yes [provider]  morphine (MSIR) 15 MG tablet Take 7.5 mg by mouth every 4 (four) hours as needed for moderate pain.    Yes [provider]  ondansetron (ZOFRAN) 4 MG tablet Take 4 mg by mouth every 8 (eight) hours as needed for nausea or vomiting.   Yes [provider]  pantoprazole (PROTONIX) 40 MG tablet Take 1 tablet (40 mg total) by mouth daily. For GERD Patient taking differently: Take 40 mg by mouth daily with lunch. For GERD 07/16/15  Yes Rai, Ripudeep K, MD  Polyvinyl Alcohol-Povidone PF (REFRESH) 1.4-0.6 % SOLN Place 1 drop into both eyes 2 (two) times daily as needed (dry eyes).   Yes [provider]  simvastatin (ZOCOR) 10 MG tablet Take 10 mg by mouth at bedtime.   09/24/15  Yes [provider]  traMADol (ULTRAM) 50 MG tablet Take 50 mg by mouth every 6 (six) hours as needed for moderate pain.   Yes [provider]  traZODone (DESYREL) 50 MG tablet Take 50 mg by mouth at bedtime.   Yes [provider]  UNABLE TO FIND Place 1 application into the right eye daily as needed (eye care). Ocular Ointment    Yes [provider]  zinc oxide 20 % ointment Apply 1 application topically 2 (two) times daily as needed for irritation.   Yes [provider]    Inpatient Medications: Scheduled Meds: . carbidopa-levodopa  1 tablet Oral TID  . finasteride  5 mg Oral Daily  . gabapentin  400-800 mg Oral See admin instructions  . insulin aspart  0-9 Units Subcutaneous Q6H  . [START ON 06/26/2017] pantoprazole  40 mg Intravenous Q12H  . sodium chloride flush  3 mL Intravenous Q12H  . sodium chloride flush  3 mL Intravenous Q12H  . traZODone  50 mg Oral QHS   Continuous Infusions: . sodium chloride    . sodium chloride    . pantoprozole (PROTONIX) infusion 8 mg/hr (06/23/17 0711)   PRN Meds: sodium chloride, acetaminophen **OR** acetaminophen, morphine, ondansetron, Polyvinyl Alcohol-Povidone PF, sodium chloride flush, traMADol  Allergies:    Allergies  Allergen Reactions  . Avodart [Dutasteride] Other (See Comments)    Lose of use of arms and legs   . Spironolactone Other (See Comments)    Low tolerance   . Codeine Other (See Comments)    GI upset in large doses  . Doxazosin Other (See Comments)    Dizziness   . Feraheme [Ferumoxytol] Swelling    Pedal edema    Social History:   Social History   Socioeconomic History  . Marital status: Married    Spouse name: Not on file  . Number of children: 0  . Years  of education: Not on file  . Highest education level: Not on file  Occupational History  . Occupation: retired  Scientific laboratory technician  . Financial resource strain: Not on file  . Food insecurity:    Worry:  Not on file    Inability: Not on file  . Transportation needs:    Medical: Not on file    Non-medical: Not on file  Tobacco Use  . Smoking status: Former Smoker    Last attempt to quit: 09/30/1971    Years since quitting: 45.7  . Smokeless tobacco: Never Used  . Tobacco comment: Quit August 973  Substance and Sexual Activity  . Alcohol use: No  . Drug use: No  . Sexual activity: Not Currently  Lifestyle  . Physical activity:    Days per week: Not on file    Minutes per session: Not on file  . Stress: Not on file  Relationships  . Social connections:    Talks on phone: Not on file    Gets together: Not on file    Attends religious service: Not on file    Active member of club or organization: Not on file    Attends meetings of clubs or organizations: Not on file    Relationship status: Not on file  . Intimate partner violence:    Fear of current or ex partner: Not on file    Emotionally abused: Not on file    Physically abused: Not on file    Forced sexual activity: Not on file  Other Topics Concern  . Not on file  Social History Narrative   ** Merged History Encounter **        Family History:   Family History  Problem Relation Age of Onset  . Heart disease Father   . Osteoarthritis Mother   . Hypertension Sister   . Hyperlipidemia Unknown   . Cancer Maternal Aunt    Family Status:  Family Status  Relation Name Status  . Father  Deceased  . Mother  Deceased  . Sister  (Not Specified)  . Unknown  (Not Specified)  . Mat Aunt  (Not Specified)  . MGM  Deceased  . MGF  Deceased  . PGM  Deceased  . PGF  Deceased    ROS:  Please see the history of present illness.  All other ROS reviewed and negative.     Physical Exam/Data:   Vitals:   06/23/17 1010 06/23/17 1012 06/23/17 1020 06/23/17 1027  BP: (!) 103/52 100/67 (!) 100/52 101/87  Pulse: 94 94 92 93  Resp: 17 16 15 18   Temp:  98.4 F (36.9 C)  98.3 F (36.8 C)  TempSrc:    Oral  SpO2: 98% 93% 98%  99%  Weight:      Height:        Intake/Output Summary (Last 24 hours) at 06/23/2017 1222 Last data filed at 06/23/2017 1008 Gross per 24 hour  Intake 320 ml  Output -  Net 320 ml   Filed Weights   06/23/17 0158  Weight: 170 lb (77.1 kg)   Body mass index is 25.1 kg/m.  General:  Well nourished, well developed, in no acute distress HEENT: normal Lymph: no adenopathy Neck: JVD to jaw Endocrine:  No thryomegaly Vascular: No carotid bruits; upper extremity pulses 2+, lower extremity pulses not palpable, capillary refill delayed. Cardiac:  normal S1, S2; RRR; 3/6 systolic, soft diastolic murmurs Lungs: few rales bases bilaterally, no wheezing, rhonchi   Abd: soft,  nontender, no hepatomegaly  Ext: trace-1+ LE edema Musculoskeletal:  No deformities, BUE and BLE strength weak but equal Skin: warm and dry  Neuro:  CNs 2-12 intact, no focal abnormalities noted Psych:  Normal affect   EKG:  The EKG was personally reviewed and demonstrates:  SR, HR 95, 1st deg AVB, ST depression infero-lateral leads Telemetry:  Telemetry was personally reviewed and demonstrates:  SR, ST  Relevant CV Studies:  ECHO: 05/25/2017 SUMMARY The left ventricular size is normal. There is moderate concentric left ventricular hypertrophy.  Left ventricular systolic function is normal. LV ejection fraction = 60-65%. The left ventricular wall motion is normal. The right ventricle is mildly dilated. The right ventricular systolic function is normal. There is Doppler evidence for a left-to-right shunt. There is a bioprosthetic aortic valve. There is significant stenosis of the  aortic prosthesis. The peak aortic velocity is 4.2 m/sec. Aortic valve mean pressure gradient is 42.3 mmHg. There is mild mitral regurgitation. There is moderate tricuspid regurgitation. Moderate pulmonary hypertension. The aortic root is normal size. There is no pericardial effusion. Compared to prior study, there has been an  increase in the gradients across  the aortic prosthesis. Clinical correlation suggested. - FINDINGS:  LEFT VENTRICLE The left ventricular size is normal. There is moderate concentric left  ventricular hypertrophy. Left ventricular systolic function is normal. LV  ejection fraction = 60-65%. Left ventricular filling pattern is  indeterminate. The left ventricular wall motion is normal. -  RIGHT VENTRICLE The right ventricle is mildly dilated. The right ventricular systolic  function is normal.  LEFT ATRIUM The left atrium is mildly dilated.  RIGHT ATRIUM The right atrium is mildly dilated. There is Doppler evidence for a  left-to-right shunt. - AORTIC VALVE There is mild aortic regurgitation. Aortic valve peak pressure gradient is  73.6 mmHg. Aortic valve mean pressure gradient is 42.3 mmHg. There is a  bioprosthetic aortic valve. - MITRAL VALVE There is moderate mitral annular calcification. There is mild mitral  Regurgitation. - TRICUSPID VALVE Structurally normal tricuspid valve. There is moderate tricuspid  regurgitation. Moderate pulmonary hypertension. - PULMONIC VALVE Structurally normal pulmonic valve. Trace pulmonic valvular regurgitation. - ARTERIES The aortic root is normal size. - VENOUS Pulmonary venous flow pattern is blunted. IVC size was mildly dilated.  Systolic flow reversal of the hepatic vein spectral Doppler flow pattern. - EFFUSION There is no pericardial effusion. - - MMode/2D Measurements & Calculations IVSd: 1.4 cm LVIDd: 5.1 cm LVPWd: 1.4 cm LVIDs: 2.9 cm LA diam: 5.5 cm Ao root: 3.4 cm EDV(MOD-sp4): 155.9 ml ESV(MOD-sp4): 65.3 ml Ao sinus diam: 3.4 cm LVOT diam: 2.2 cm SV(MOD-sp4): 90.6 ml SI(MOD-sp4): 49.5 ml/m2 IVC 1: 2.1 cm LA area A2: 22.7 cm2 LA area A4: 28.4 cm2 LA vol: 90.1 ml LA vol index: 49.2 ml/m2 RA area A4: 21.9 cm2 Doppler Measurements & Calculations MV E max vel: 183.6 cm/sec MV A max vel: 45.6 cm/sec MV  E/A: 4.0 Med Peak E' Vel: 7.4 cm/sec Lat Peak E' Vel: 11.4 cm/sec E/Lat E`: 16.1 E/Med E`: 25.0 MV dec time: 0.17 sec SV(LVOT): 95.9 ml Ao V2 max: 429.0 cm/sec Ao max PG: 73.6 mmHg Ao V2 mean: 308.9 cm/sec Ao mean PG: 42.3 mmHg Ao V2 VTI: 103.0 cm AVA (VTI): 0.93 cm2 LV V1 VTI: 26.1 cm TR max vel: 380.4 cm/sec TR max PG: 57.9 mmHg RVSP(TR): 67.9 mmHg RAP systole: 10.0 mmHg AS Dimensionless Index (VTI): 0.25 AVAi(VTI) cm^2/m^2: 0.51 cm2 SV index(LVOT): 52.4 ml/m2  CATH: 11/30/2011 Hemodynamic Findings:  Central aortic pressure: 111/64 Left ventricular pressure: 214/11/21 RA: 6 RV: 36/5/10 PA: 37/8 (mean 23) PCWP: 15 AO: 96% PA: 71% CO: 8.9 L/min (by Fick) CI: 4.5 L/min/m2 (by Fick  AVA: 0.94 cm2  Angiographic Findings:  Left main: No obstructive disease.   Left Anterior Descending Artery: Large caliber vessel that courses to the apex. No significant diagonal branches. No obstructive disease.   Circumflex Artery: Large caliber vessel with no obstructive disease. Moderate caliber intermediate branch with no obstructive disease.   Right Coronary Artery: Large dominant vessel with no obstructive disease.   Left Ventricular Angiogram: Deferred.   Impression: 1. No angiographic evidence of CAD 2. 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 (110 mm peak to peak gradient with AVA of 0.94)  Laboratory Data:  Chemistry Recent Labs  Lab 06/23/17 0253  NA 132*  K 3.6  CL 97*  CO2 22  GLUCOSE 129*  BUN 24*  CREATININE 0.52*  CALCIUM 9.4  GFRNONAA >60  GFRAA >60  ANIONGAP 13    Lab Results  Component Value Date   ALT 13 (L) 06/23/2017   AST 45 (H) 06/23/2017   ALKPHOS 96 06/23/2017   BILITOT 3.6 (H) 06/23/2017   Hematology Recent Labs  Lab 06/23/17 0253 06/23/17 0552  WBC 6.7 7.8  RBC 1.94* 1.99*  HGB 5.7* 5.9*  HCT 18.8* 19.5*  MCV 96.9 98.0  MCH 29.4 29.6  MCHC 30.3 30.3  RDW 25.3* 25.4*  PLT 124* 154   Cardiac  Enzymes Recent Labs  Lab 06/23/17 0253 06/23/17 0552  TROPONINI 0.16* 0.49*     BNP Recent Labs  Lab 06/23/17 0253  BNP 1,197.3*    TSH:  Lab Results  Component Value Date   TSH 1.233 11/28/2011   Lipids: Lab Results  Component Value Date   CHOL 133 02/12/2015   HDL 38 (L) 02/12/2015   LDLCALC 36 02/12/2015   TRIG 293 (H) 02/12/2015   CHOLHDL 3.5 02/12/2015   HgbA1c: Lab Results  Component Value Date   HGBA1C 4.3 11/30/2011   Magnesium:  Magnesium  Date Value Ref Range Status  12/07/2011 2.2 1.5 - 2.5 mg/dL Final     Radiology/Studies:  Dg Chest Port 1 View  Result Date: 06/23/2017 CLINICAL DATA:  Weakness EXAM: PORTABLE CHEST 1 VIEW COMPARISON:  11/18/2016 FINDINGS: Postsurgical changes of the mediastinum with valve prosthetic. Right-sided central venous port tip overlies the proximal right atrium. Cardiomegaly with vascular congestion and mild diffuse interstitial opacity, likely mild edema. No pneumothorax. IMPRESSION: Cardiomegaly with vascular congestion and mild pulmonary edema Electronically Signed   By: Donavan Foil M.D.   On: 06/23/2017 03:00    Assessment and Plan:   1. Elevated troponin - agree with this being demand ischemia in the setting of severe anemia - no hx CAD, nl cors at cath 2013 - initial ECG was abnormal, but is improved with treatment of anemia - do not see a role for ischemic evaluation at this time.  - Echo was recently checked results above. - Do not see a role for recheck.   2. Severe Aortic stenosis - in 2013, s/p redo median sternotomy with redo AVR, Edwards Magna Ease Pericardial Tissue Valve (size 23 mm, model # 3300TFX, serial # [id number]). - with mean gradient 42.3 and peak gradient 73.6, once again qualifies for severe AS - however, Dr Roxy Manns has previously evaluated pt and stated he would be high risk for 3rd AVR. - per [facility] not 01/2017, anemia felt more likely from GI  blood loss and MM than hemolysis - will ck Haptoglobin to  make sure pervalvular leak has not progressed, i.e. hemolysis.   Otherwise, per IM Principal Problem:   Symptomatic anemia Active Problems:   Aortic stenosis   Multiple myeloma not having achieved remission (HCC)   GI bleed   Paravalvular leak of prosthetic heart valve   Type II diabetes mellitus with complication (HCC)   Demand ischemia (HCC)   Hyponatremia     For questions or updates, please contact [facility] Please consult [email or web address] for contact info under Cardiology/STEMI.   Signed, Rosaria Ferries, PA-C  06/23/2017 12:22 PM  Personally seen and examined. Agree with above.  72 year old male with bioprosthetic aortic valve x2, multiple myeloma, normal coronary arteries, GI bleed with AVMs.  Cholecystostomy tube.  [facility] discharged from [facility], requiring multiple transfusions, thrombocytopenia, restless leg Sinemet taper.  Arrived here with a hemoglobin of 5 nausea feeling poorly.  Because of his extensive cardiac history and troponin of 0.4 we were asked to consult.  On exam he appears severely ill, pale, leaning over, cachectic, right greater than left lower extremity edema, 3/6 systolic murmur regular rate and rhythm, petechiae on forearms.  Lab work personally reviewed.  Haptoglobin pending.  Hypotensive.  Assessment and plan:  Elevated troponin - Hemoglobin being severely decreased has resulted in mild supply demand mismatch and mildly elevated troponin which is not ACS, no rise and fall.  This does however portend a worsened prognosis.  At [facility] he was told the same thing.  Continue with aggressive secondary prevention as permissible.  Severe prosthetic valve aortic stenosis -Mean gradient 44 mmHg.  He is not a candidate for further surgery.  Has perivalvular leak.  I will check a haptoglobin to ensure that he does not have worsening hemolytic anemia.  Bilirubin is mildly elevated in the 3 range.  Severe anemia -Has been diagnosed in the past  with AVMs.  Has required multiple blood transfusions.  Multiple myeloma probably is also playing a role.  Continue with aggressive resuscitation efforts.  Consider palliative care consultation tomorrow to further discuss goals of care.  He is severely ill with multiple comorbidities.  Please let us know we can be of further assistance.  Candee Furbish, MD

## 2017-06-23 NOTE — ED Notes (Signed)
Personal fan ordered and delivers to patient as requested

## 2017-06-23 NOTE — ED Notes (Signed)
Coffee and cream offered &  accepted to PT's spouse

## 2017-06-23 NOTE — ED Notes (Signed)
Keep SBP in the 90s per Attending

## 2017-06-23 NOTE — ED Notes (Signed)
Pt sitting on bedside toilet with wife by side; to be transported to floor when pt has finished with toileting

## 2017-06-23 NOTE — ED Notes (Signed)
Assumed care of pt at this time; pt awake, groggy, conversant - wife at bedside; biliary tube to rgt lateral abd intact - hooked to drainage bag - draining well at this time; port to rgt anterior chest wall previously accessed - Protonix infusing at this time via pump

## 2017-06-23 NOTE — ED Notes (Signed)
Nurse will draw labs, accessing port

## 2017-06-23 NOTE — ED Triage Notes (Signed)
Pt arrives via EMS from home with c/o "don't feel good." States he has nausea, no vomiting or pain. Reports hx of multiple myeloma but not actively treating it d/t gallbladder issue? Reports he gets 4 units of blood a week, so far only two this week. Appointment this morning at [facility] to see if he needs more blood. Port in R chest.

## 2017-06-23 NOTE — Progress Notes (Signed)
Notified Schorr NP about critical troponin of 3.10, HGB- 7.4, HCT- 23.4.  Schorr requested that I notify cardiology of increased Troponin. Lanna Poche notified and will review chart. No intervention at this time due to chronic GI Bleed with symptomatic anemia.

## 2017-06-23 NOTE — ED Notes (Signed)
In to assess; pt resting quietly on stretcher with eyes closed - easily arousable; wife remains at bedside; Protonix infusing without difficulty - continue to wait on inpt bed assignment - pt and wife made aware of same; hypotensive at present 76/44, HR 80, resp 16, O2 sat 100% RA

## 2017-06-23 NOTE — ED Notes (Addendum)
Attempted twice no success.  For blood

## 2017-06-23 NOTE — ED Notes (Signed)
Patient continues to report nausea.  

## 2017-06-23 NOTE — Care Management (Signed)
This is a no charge note  Pending admission per Dr. Vanita Panda  72 year old lady with past medical history of multiple myeloma, chronic GI bleeding, getting blood transfusion twice a week in [facility], hypertension, hyperlipidemia, diabetes mellitus, asthma, GERD, depression, BPH, OSA, aortic stenosis, dCHF, who presents with symptomatic anemia with hemoglobin 5.7.    Patient has troponin 0.16, but no chest pain, likely due to demand ischemia.  Blood pressure 79/62, 102/58, 87/68. Will transfuse 2 unit of blood now.  Patient is unstable, therefore cannot be transferred back to [facility] now.  Will admit to stepdown bed as inpatient.  Ivor Costa, MD  [facility] Pager [phone]  If 7PM-7AM, please contact night-coverage [email or web address] Password [facility] 06/23/2017, 5:44 AM

## 2017-06-23 NOTE — ED Notes (Signed)
Spoke with phleb - made aware of need for repeat CBC

## 2017-06-23 NOTE — H&P (Addendum)
History and Physical  [name]:[id number] DOB: May 12, 1945 DOA: 06/23/2017  PCP: [facility]  Dr. 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  EDP felt that given hypotension, elevated troponin, anemia that he was not stable for transfer to [facility].  Patient reports since discharge 5/15 he has had progressive weakness, fatigue and feels poorly.  Describes symptoms as being severe.  Associated nausea.  No specific aggravating or alleviating factors.  He has had nausea.  He reports chronic dark stools without change.     Admitted 5/10-5/15 to [facility] for shortness of breath, symptomatic anemia, acute on chronic diastolic CHF treated with IV Lasix, acute on chronic anemia, drug-induced dyskinesia.  Discharged on Lasix 60 mg twice daily;  Pancytopenia secondary to multiple myeloma, treated with blood transfusions.  Oncology office visit 5/6.  Refractory multiple myeloma characterized as stable.  Therapy on hold.  Plans for follow-up in 8 weeks.  Plan to coordinate transfusions with the [facility] [employer].  Admitted 4/16-4/24 to [facility] for severe anemia hemoglobin 5.2, hemorrhagic shock requiring vasopressors, associated NSTEMI, troponin peaked 0.571.  Treated for pneumonia.  Required vasopressors.  Known GI bleed with history of AVMs, status post capsule endoscopy at [facility] multiple EGDs and colonoscopy per report.  Characterizes requiring twice weekly transfusions in outpatient setting.  Deemed to be  nonsurgical candidate.  GI at that time recommended no intervention.  TAVR was recommended against. Cholecystitis, cholecystostomy tube was placed January 2019.  Exchanged 4/24.  ED Course: Treated with IV Protonix, transfusion PRBC, currently on first unit  Review of Systems:  Negative for fever, visual changes, sore throat, rash, new muscle aches, chest pain, SOB, dysuria, v/abdominal pain.  Positive for feeling hot, cold, nausea  Past Medical History:  Diagnosis Date  . Allergic rhinitis   . Anemia 2009  . Aortic stenosis    a. s/p porcine AVR 2006;  b. s/p redo tissue AVR 12/2011 (Dr. Roxy Manns) - preAVR LHC with no CAD  . Asthma   . BPH (benign prostatic hypertrophy)   . Cancer ([place])    multiple myeloma  . Cataract   . Chronic cough   . Chronic interstitial cystitis   . Depression    pt denies  . Diabetes mellitus    type 2  . Diabetes mellitus without complication ([place])   . Diverticulosis of colon    on colonoscopy 2008  . GERD (gastroesophageal reflux disease)    bravo pH study 2008  . Gout   . H/O aortic valve replacement with porcine valve    2006, 2013  . Headache(784.0)   . Hemorrhoids    external and internal  . Hiatal hernia   . HTN (hypertension)   . Hx of echocardiogram    a. Echo  (post AVR) 12/2011:  mod LVH, EF 60-65%, Gr 2 diast dysfn, mild AI, AVR ok (mean gradient 19 mmHg), MAC, mild BAE  . Hyperlipidemia   . Hypertension   . Hyperthyroidism   . IBS (irritable bowel syndrome)   . Insomnia   . Lower GI bleed 07/16/2015  . OA (  osteoarthritis)   . OSA (obstructive sleep apnea)    USES CPAP AS NEEDED  . 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 Periodontitis    chronic with bone loss  . Restless leg syndrome   . S/P aortic valve replacement with bioprosthetic valve 12/06/2011   Redo AVR using 23 mm [facility] Ease pericardial tissue valve    Past Surgical History:  Procedure Laterality Date  . AORTA - FEMORAL ARTERY BYPASS GRAFT     . AORTIC VALVE REPLACEMENT  10/13/2004   57m [place]im[place] pericardial tissue valve  . AORTIC VALVE REPLACEMENT  12/06/2011   Procedure: REDO AORTIC VALVE REPLACEMENT (AVR);  Surgeon: CRexene Alberts MD;  [place] [facility]  Service: Open Heart Surgery;  Laterality: N/A;  . BUNIONECTOMY     right  . CARDIAC SURGERY     aorta vavle replacement  . CATARACT EXTRACTION  2009    &   2012   BILATERAL  . COLONOSCOPY  08/31/2011   Procedure: COLONOSCOPY;  Surgeon: RInda Castle MD;  [place] [facility]  Service: Endoscopy;  Laterality: N/A;  . ESOPHAGOGASTRODUODENOSCOPY  08/30/2011   Procedure: ESOPHAGOGASTRODUODENOSCOPY (EGD);  Surgeon: RInda Castle MD;  [place] [facility]  Service: Endoscopy;  Laterality: N/A;  Rm 3005   . HERNIA REPAIR    . JOINT REPLACEMENT     knee  . LEFT HEART CATHETERIZATION WITH CORONARY ANGIOGRAM N/A 11/30/2011   Procedure: LEFT HEART CATHETERIZATION WITH CORONARY ANGIOGRAM;  Surgeon: CBurnell Blanks MD;  [facility];  Service: Cardiovascular;  Laterality: N/A;  . NASAL SEPTOPLASTY W/ TURBINOPLASTY    . REFRACTIVE SURGERY     bilateral  . RIGHT HEART CATHETERIZATION Bilateral 11/30/2011   Procedure: RIGHT HEART CATH;  Surgeon: CBurnell Blanks MD;  [facility];  Service: Cardiovascular;  Laterality: Bilateral;  . right knee arthroscopy    . TEE WITHOUT CARDIOVERSION N/A 10/27/2015   Procedure: TRANSESOPHAGEAL ECHOCARDIOGRAM (TEE);  Surgeon: BLelon Perla MD;  [facility];  Service: Cardiovascular;  Laterality: N/A;     reports that he quit smoking about 45 years ago. He has never used smokeless tobacco. He reports that he does not drink alcohol or use drugs.   Allergies  Allergen Reactions  . Avodart [Dutasteride] Other (See Comments)    Lose of use of arms and legs   . Spironolactone Other (See Comments)    Low tolerance   . Codeine Other (See Comments)    GI upset in large doses  .  Doxazosin Other (See Comments)    Dizziness   . Feraheme [Ferumoxytol] Swelling    Pedal edema    Family History  Problem Relation Age of Onset  . Heart disease Father   . Osteoarthritis Mother   . Hypertension Sister   . Hyperlipidemia Unknown   . Cancer Maternal Aunt      Prior to Admission medications   Medication Sig Start Date End Date Taking? Authorizing Provider  acetaminophen (TYLENOL) 325 MG tablet Take 650 mg by mouth every 6 (six) hours as needed for fever.    Yes [provider]  carbidopa-levodopa (SINEMET IR) 25-100 MG tablet Take 1 tablet by mouth 3 (three) times daily.    Yes [provider]  cyanocobalamin 500 MCG tablet Take 500 mcg by mouth 2 (two) times daily. Lunch/dinner   Yes [provider]  docusate sodium (COLACE) 100 MG capsule Take 100 mg by mouth 2 (two) times  daily as needed for moderate constipation.  08/20/15  Yes [provider]  finasteride (PROSCAR) 5 MG tablet Take 5 mg by mouth daily.   Yes [provider]  fish oil-omega-3 fatty acids 1000 MG capsule Take 1 g by mouth daily with lunch.    Yes [provider]  folic acid (FOLVITE) 1 MG tablet Take 1 mg by mouth daily. 09/24/15  Yes [provider]  furosemide (LASIX) 40 MG tablet TAKE 2 TABLETS (80 MG TOTAL) BY MOUTH DAILY. TAKE A EXTRA TABLET ON DAYS OF DIALYSIS Patient taking differently: Take 60 mg by mouth 2 (two) times daily.  05/15/17  Yes Almyra Deforest, PA  gabapentin (NEURONTIN) 800 MG tablet Take 400-800 mg by mouth See admin instructions. Take 1/2 tablet every morning and at bedtime then take 1 tablet at lunch and dinner   Yes [provider]  ixazomib citrate (NINLARO) 3 MG capsule Take 3 mg by mouth once a week. Off on days 1, 8 and 15 of a 28 day cycle. Take on an empty stomach 1hr before or 2hrs after food. Do not crush, chew, or open.   Yes [provider]  metFORMIN (GLUCOPHAGE) 500 MG tablet Take 500 mg by  mouth 2 (two) times daily with a meal.   Yes [provider]  morphine (MSIR) 15 MG tablet Take 7.5 mg by mouth every 4 (four) hours as needed for moderate pain.    Yes [provider]  ondansetron (ZOFRAN) 4 MG tablet Take 4 mg by mouth every 8 (eight) hours as needed for nausea or vomiting.   Yes [provider]  pantoprazole (PROTONIX) 40 MG tablet Take 1 tablet (40 mg total) by mouth daily. For GERD Patient taking differently: Take 40 mg by mouth daily with lunch. For GERD 07/16/15  Yes Rai, Ripudeep K, MD  Polyvinyl Alcohol-Povidone PF (REFRESH) 1.4-0.6 % SOLN Place 1 drop into both eyes 2 (two) times daily as needed (dry eyes).   Yes [provider]  simvastatin (ZOCOR) 10 MG tablet Take 10 mg by mouth at bedtime.  09/24/15  Yes [provider]  traMADol (ULTRAM) 50 MG tablet Take 50 mg by mouth every 6 (six) hours as needed for moderate pain.   Yes [provider]  traZODone (DESYREL) 50 MG tablet Take 50 mg by mouth at bedtime.   Yes [provider]  UNABLE TO FIND Place 1 application into the right eye daily as needed (eye care). Ocular Ointment    Yes [provider]  zinc oxide 20 % ointment Apply 1 application topically 2 (two) times daily as needed for irritation.   Yes [provider]    Physical Exam: Vitals:   06/23/17 0719 06/23/17 0730  BP: (!) 85/48 (!) 96/53  Pulse: 96 98  Resp: 19 (!) 22  Temp: 98.6 F (37 C)   SpO2: 100% 100%    Constitutional:   Appears mildly anxious, uncomfortable but nontoxic.  Visibly pale. Eyes:  pupils and irises appear normal Normal lids.  Conjunctivae very pale. ENMT:  grossly normal hearing  Lips appear normal Neck:  neck appears normal, no masses no thyromegaly Respiratory:  CTA bilaterally, no w/r/r.  Respiratory effort normal.  Cardiovascular:  RRR, no m/r/g No LE extremity edema   Abdomen:  Soft, nontender, nondistended Musculoskeletal:    Digits/nails BUE: no clubbing, cyanosis, petechiae, infection RUE, LUE, RLE, LLE   strength and tone normal, no atrophy, no abnormal movements Skin:  No rashes,  lesions, ulcers palpation of skin: no induration or nodules Psychiatric:  Mental status Mood, affect appropriate judgement and insight appear normal   I have personally reviewed following labs and imaging studies  Labs:   Troponin 0.16 >> 0.49  Hemoglobin 5.9, platelets 154  Sodium 132, remainder BMP unremarkable.  AST 45, total bilirubin 3.6.    Imaging studies:   Chest x-ray independently reviewed, mild CHF.  Medical tests:   EKG independently reviewed: Sinus rhythm, first-degree AV block, PVC  Decision to obtain old records:   Echocardiogram was completed at [facility] in September 2018 which revealed a EF of 60% to 65%, bioprosthetic aortic valve had a mean gradient of 30 mmHg, mild AI, moderate TR, and moderate pulmonary hypertension.    Review and summation of old records:   Echo 08/2016 Study Conclusions  - Left ventricle: The cavity size was normal. There was mild   concentric hypertrophy. Systolic function was vigorous. The   estimated ejection fraction was in the range of 65% to 70%. - Ventricular septum: Septal motion showed paradox. - Aortic valve: A bioprosthesis was present. Moderate leaflet   thickening and restricted motion is seen. There was mild   regurgitation directed centrally in the LVOT. There was mild   perivalvular regurgitation. Valve area (VTI): 1.31 cm^2. Valve   area (Vmax): 1.31 cm^2. Valve area (Vmean): 1.47 cm^2. - Mitral valve: Calcified annulus. - Left atrium: The atrium was severely dilated. - Right atrium: The atrium was moderately dilated. - Pulmonary arteries: Systolic pressure   Active Problems:   Symptomatic anemia   Assessment/Plan Severe acute on chronic anemia, with borderline hemorrhagic shock, multifactorial including chronic GI blood  loss, multiple myeloma, hemolysis secondary to aortic valve with associated hypotension per documentation summarized above (see [facility] discharge summaries) patient has been previously evaluated by GI and no intervention recommended. --Blood pressure improved, systolic in the 17O. --Complete 2 units PRBC and recheck hemoglobin. --Will type and cross 2 additional units now and transfuse as necessary. --IV Protonix.  Patient reports no change in the character of his chronic GI bleed.  Previously no intervention offered by GI at [facility]. --If bleeding worsens or condition changes would consider GI consultation here although management in the past recommended to be conservative  Elevated troponin, likely demand ischemia given severe anemia, CHF, aortic stenosis. --Trend troponin.  Given the patient's complex medical history, previous NSTEMI in the face of anemia, severe aortic stenosis making volume management difficult, will consult cardiology for assistance.  Severe aortic stenosis, perivalvular leak, not a surgical candidate, followed by cardiology.  According to office note 4/29 "easily dyspneic and often has lower extremity edema. Last echo revealed normal systolic function and Grade I diastolic dysfunction. His BP is low and I do not wish to increase lasix as this time to prevent reduction in pre-load" --Lower extremity edema reportedly improved compared to previous per wife. --Lasix as needed today.  Hopefully can resume scheduled dose tomorrow.  Chronic hyponatremia appears to be at baseline, mild. --Suspect related to CHF.  Follow-up as an outpatient.  Chronic diastolic CHF with significant lower extremity edema on last office visit.  Was advised to continue current dosing 4/29. --Appears nearly compensated at this point despite elevated BNP.  Lower extremity edema is 1+.  Lungs are clear.  Chronic cholecystitis with elevated transaminases, total bilirubin.  Cholecystostomy tube  placed at [facility] April 2019 --Laboratory studies appear stable.  Follow-up as an outpatient.  Complex issues, prognosis is  guarded.  Admit to stepdown.  Patient is critically ill with evidence of demand ischemia secondary to severe GI blood loss and borderline hypotension.  Critical care time spent 50 minutes  Severity of Illness: The appropriate patient status for this patient is INPATIENT. Inpatient status is judged to be reasonable and necessary in order to provide the required intensity of service to ensure the patient's safety. The patient's presenting symptoms, physical exam findings, and initial radiographic and laboratory data in the context of their chronic comorbidities is felt to place them at high risk for further clinical deterioration. Furthermore, it is not anticipated that the patient will be medically stable for discharge from the hospital within 2 midnights of admission. The following factors support the patient status of inpatient.   See immediately above  * I certify that at the point of admission it is my clinical judgment that the patient will require inpatient hospital care spanning beyond 2 midnights from the point of admission due to high intensity of service, high risk for further deterioration and high frequency of surveillance required.*   DVT prophylaxis:SCDs Code Status: full Family Communication: wife at bedside Consults called: cardiology      Murray Hodgkins, MD  [facility] Direct contact: [phone] --[place] [place]  --[email or web address]; password [email or web address]  7PM-7AM contact night coverage as above  06/23/2017, 8:31 AM

## 2017-06-23 NOTE — ED Notes (Signed)
Admitting MD in to assess pt; BP currently 98/56; pt awake, alert, communicative

## 2017-06-23 NOTE — ED Notes (Signed)
Lab notified of need for repeat CBC post blood transfusion - states will notify 5W phleb.

## 2017-06-23 NOTE — ED Provider Notes (Addendum)
[facility] EMERGENCY DEPARTMENT Provider Note   CSN: [id number] Arrival date & time: 06/23/17  0156     History   Chief Complaint Chief Complaint  Patient presents with  . Nausea  . Fatigue    "feels like I'm going to die."    HPI [name] is a 72 y.o. male.  HPI Vision presents with concern of weakness, nausea, insomnia. Patient has multiple medical issues including chronic GI bleed, aortic stenosis, persistent percutaneous drain of his biliary system. He notes that he has been recently under the care of physicians at [facility], has been receiving weekly transfusions due to his ongoing GI bleed. Patient also has multiple myeloma, though is not currently receiving therapy for this. He notes that he is scheduled for transfusion later today. However, the patient has felt progressively weak, nauseous, uncomfortable over the past few hours, and presents for evaluation. He denies any new acute pain, and there was no clear precipitant for this change in his condition. Since onset a few hours ago, no clear alleviating or exacerbating factors, but with progression of his symptoms he presents for evaluation.  Past Medical History:  Diagnosis Date  . Allergic rhinitis   . Anemia 2009  . Aortic stenosis    a. s/p porcine AVR 2006;  b. s/p redo tissue AVR 12/2011 (Dr. Roxy Manns) - preAVR LHC with no CAD  . Asthma   . BPH (benign prostatic hypertrophy)   . Cancer ([place])    multiple myeloma  . Cataract   . Chronic cough   . Chronic interstitial cystitis   . Depression    pt denies  . Diabetes mellitus    type 2  . Diabetes mellitus without complication ([place])   . Diverticulosis of colon    on colonoscopy 2008  . GERD (gastroesophageal reflux disease)    bravo pH study 2008  . Gout   . H/O aortic valve replacement with porcine valve    2006, 2013  . Headache(784.0)   . Hemorrhoids    external and internal  . Hiatal hernia   . HTN  (hypertension)   . Hx of echocardiogram    a. Echo  (post AVR) 12/2011:  mod LVH, EF 60-65%, Gr 2 diast dysfn, mild AI, AVR ok (mean gradient 19 mmHg), MAC, mild BAE  . Hyperlipidemia   . Hypertension   . Hyperthyroidism   . IBS (irritable bowel syndrome)   . Insomnia   . Lower GI bleed 07/16/2015  . OA (osteoarthritis)   . OSA (obstructive sleep apnea)    USES CPAP AS NEEDED  . 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 Periodontitis    chronic with bone loss  . Restless leg syndrome   . S/P aortic valve replacement with bioprosthetic valve 12/06/2011   Redo AVR using 23 mm [facility] Ease pericardial tissue valve    Patient Active Problem List   Diagnosis Date Noted  . Acute respiratory distress 11/19/2016  . Atrial flutter ([place]) 11/19/2016  . Anemia due to chronic blood loss   . Pulmonary edema 10/02/2016  . Dehydration   . Shortness of breath 09/24/2016  . Dyspnea 08/20/2016  . Pancytopenia ([place]) 08/20/2016  . Type II diabetes mellitus with complication ([place]) 70/48/8891  . Hypoxia 08/20/2016  . CHF (congestive heart failure) ([place]) 08/20/2016  . Sepsis, unspecified organism ([place]) 03/23/2016  . Acute on chronic diastolic congestive heart failure ([place]) 03/23/2016  . Influenza A 03/23/2016  .  Acute respiratory failure ([place]) 03/23/2016  . 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 Rectal bleeding   . Internal bleeding hemorrhoids   . Symptomatic anemia 07/16/2015  . GI bleed 07/16/2015  . Hemorrhoids 07/16/2015  . Multiple myeloma not having achieved remission ([place])   . Syncope and collapse   . Acute blood loss anemia 03/17/2015  . Bruit 01/19/2015  . S/P aortic valve replacement with bioprosthetic valve 12/06/2011  . Aortic stenosis 11/30/2011  . Chronic daily headache 11/30/2011  . Iron deficiency anemia, unspecified 09/15/2011  . IBS (irritable bowel syndrome) 09/15/2011  . Syncope 08/29/2011  . Cough syncope 10/13/2010  .  HYPERLIPIDEMIA-MIXED 07/02/2008  . Essential hypertension 07/02/2008  . GERD 07/02/2008  . OBSTRUCTIVE SLEEP APNEA 01/15/2007  . ALLERGIC  RHINITIS 01/15/2007  . INSOMNIA 01/15/2007  . OSTEOARTHRITIS 12/02/2006  . BPH (benign prostatic hyperplasia) 12/02/2006  . H/O aortic valve replacement 12/02/2006    Past Surgical History:  Procedure Laterality Date  . AORTA - FEMORAL ARTERY BYPASS GRAFT    . AORTIC VALVE REPLACEMENT  10/13/2004   2m [place] pericardial tissue valve  . AORTIC VALVE REPLACEMENT  12/06/2011   Procedure: REDO AORTIC VALVE REPLACEMENT (AVR);  Surgeon: CRexene Alberts MD;  [place]  Service: Open Heart Surgery;  Laterality: N/A;  . BUNIONECTOMY     right  . CARDIAC SURGERY     aorta vavle replacement  . CATARACT EXTRACTION  2009    &   2012   BILATERAL  . COLONOSCOPY  08/31/2011   Procedure: COLONOSCOPY;  Surgeon: RInda Castle MD;  [place]  Service: Endoscopy;  Laterality: N/A;  . ESOPHAGOGASTRODUODENOSCOPY  08/30/2011   Procedure: ESOPHAGOGASTRODUODENOSCOPY (EGD);  Surgeon: RInda Castle MD;  [place]  Service: Endoscopy;  Laterality: N/A;  Rm 3005   . HERNIA REPAIR    . JOINT REPLACEMENT     knee  . LEFT HEART CATHETERIZATION WITH CORONARY ANGIOGRAM N/A 11/30/2011   Procedure: LEFT HEART CATHETERIZATION WITH CORONARY ANGIOGRAM;  Surgeon: CBurnell Blanks MD;  [facility];  Service: Cardiovascular;  Laterality: N/A;  . NASAL SEPTOPLASTY W/ TURBINOPLASTY    . REFRACTIVE SURGERY     bilateral  . RIGHT HEART CATHETERIZATION Bilateral 11/30/2011   Procedure: RIGHT HEART CATH;  Surgeon: CBurnell Blanks MD;  [facility];  Service: Cardiovascular;  Laterality: Bilateral;  . right knee arthroscopy    . TEE WITHOUT CARDIOVERSION N/A 10/27/2015   Procedure: TRANSESOPHAGEAL ECHOCARDIOGRAM (TEE);  Surgeon: BLelon Perla MD;  [facility];  Service: Cardiovascular;   Laterality: N/A;        Home Medications    Prior to Admission medications   Medication Sig Start Date End Date Taking? Authorizing Provider  acetaminophen (TYLENOL) 325 MG tablet Take 650 mg by mouth every 6 (six) hours as needed for fever.    Yes [provider]  carbidopa-levodopa (SINEMET IR) 25-100 MG tablet Take 1 tablet by mouth 3 (three) times daily.    Yes [provider]  cyanocobalamin 500 MCG tablet Take 500 mcg by mouth 2 (two) times daily. Lunch/dinner   Yes [provider]  docusate sodium (COLACE) 100 MG capsule Take 100 mg by mouth 2 (two) times daily as needed for moderate constipation.  08/20/15  Yes [provider]  finasteride (PROSCAR) 5 MG tablet Take 5 mg by mouth daily.   Yes [provider]  fish oil-omega-3 fatty acids 1000  MG capsule Take 1 g by mouth daily with lunch.    Yes [provider]  folic acid (FOLVITE) 1 MG tablet Take 1 mg by mouth daily. 09/24/15  Yes [provider]  furosemide (LASIX) 40 MG tablet TAKE 2 TABLETS (80 MG TOTAL) BY MOUTH DAILY. TAKE A EXTRA TABLET ON DAYS OF DIALYSIS Patient taking differently: Take 60 mg by mouth 2 (two) times daily.  05/15/17  Yes Almyra Deforest, PA  gabapentin (NEURONTIN) 800 MG tablet Take 400-800 mg by mouth See admin instructions. Take 1/2 tablet every morning and at bedtime then take 1 tablet at lunch and dinner   Yes [provider]  ixazomib citrate (NINLARO) 3 MG capsule Take 3 mg by mouth once a week. Off on days 1, 8 and 15 of a 28 day cycle. Take on an empty stomach 1hr before or 2hrs after food. Do not crush, chew, or open.   Yes [provider]  metFORMIN (GLUCOPHAGE) 500 MG tablet Take 500 mg by mouth 2 (two) times daily with a meal.   Yes [provider]  morphine (MSIR) 15 MG tablet Take 7.5 mg by mouth every 4 (four) hours as needed for moderate pain.    Yes [provider]  ondansetron (ZOFRAN) 4 MG tablet Take  4 mg by mouth every 8 (eight) hours as needed for nausea or vomiting.   Yes [provider]  pantoprazole (PROTONIX) 40 MG tablet Take 1 tablet (40 mg total) by mouth daily. For GERD Patient taking differently: Take 40 mg by mouth daily with lunch. For GERD 07/16/15  Yes Rai, Ripudeep K, MD  Polyvinyl Alcohol-Povidone PF (REFRESH) 1.4-0.6 % SOLN Place 1 drop into both eyes 2 (two) times daily as needed (dry eyes).   Yes [provider]  simvastatin (ZOCOR) 10 MG tablet Take 10 mg by mouth at bedtime.  09/24/15  Yes [provider]  traMADol (ULTRAM) 50 MG tablet Take 50 mg by mouth every 6 (six) hours as needed for moderate pain.   Yes [provider]  traZODone (DESYREL) 50 MG tablet Take 50 mg by mouth at bedtime.   Yes [provider]  UNABLE TO FIND Place 1 application into the right eye daily as needed (eye care). Ocular Ointment    Yes [provider]  zinc oxide 20 % ointment Apply 1 application topically 2 (two) times daily as needed for irritation.   Yes [provider]    Family History Family History  Problem Relation Age of Onset  . Heart disease Father   . Osteoarthritis Mother   . Hypertension Sister   . Hyperlipidemia Unknown   . Cancer Maternal Aunt     Social History Social History   Tobacco Use  . Smoking status: Former Smoker    Last attempt to quit: 09/30/1971    Years since quitting: 45.7  . Smokeless tobacco: Never Used  . Tobacco comment: Quit August 973  Substance Use Topics  . Alcohol use: No  . Drug use: No     Allergies   Avodart [dutasteride]; Spironolactone; Codeine; Doxazosin; and Feraheme [ferumoxytol]   Review of Systems Review of Systems  Constitutional:       Per HPI, otherwise negative  HENT:       Per HPI, otherwise negative  Respiratory:       Per HPI, otherwise negative  Cardiovascular:       Per HPI, otherwise negative  Gastrointestinal: Positive for nausea. Negative for  vomiting.  Endocrine:       Negative aside from HPI  Genitourinary:       Neg aside from HPI   Musculoskeletal:       Per HPI, otherwise negative  Skin: Positive for color change.  Allergic/Immunologic: Positive for immunocompromised state.  Neurological: Positive for weakness. Negative for syncope.     Physical Exam Updated Vital Signs BP (!) 87/68   Pulse 94   Temp 97.9 F (36.6 C) (Oral)   Resp (!) 21   Ht 5' 9" (1.753 m)   Wt 77.1 kg (170 lb)   SpO2 100%   BMI 25.10 kg/m   Physical Exam  Constitutional: He is oriented to person, place, and time.  Ill-appearing frail elderly male awake, alert  HENT:  Head: Normocephalic and atraumatic.  Eyes: Conjunctivae and EOM are normal.  Cardiovascular: Normal rate and regular rhythm.  Pulmonary/Chest: Effort normal. No stridor. No respiratory distress.  Abdominal: He exhibits no distension.    Musculoskeletal: He exhibits no edema.  Neurological: He is alert and oriented to person, place, and time. He displays atrophy.  Skin: Skin is warm and dry.  Jaundice  Psychiatric: He has a normal mood and affect.  Nursing note and vitals reviewed.    ED Treatments / Results  Labs (all labs ordered are listed, but only abnormal results are displayed) Labs Reviewed  COMPREHENSIVE METABOLIC PANEL - Abnormal; Notable for the following components:      Result Value   Sodium 132 (*)    Chloride 97 (*)    Glucose, Bld 129 (*)    BUN 24 (*)    Creatinine, Ser 0.52 (*)    Total Protein 5.3 (*)    Albumin 2.8 (*)    AST 45 (*)    ALT 13 (*)    Total Bilirubin 3.6 (*)    All other components within normal limits  BRAIN NATRIURETIC PEPTIDE - Abnormal; Notable for the following components:   B Natriuretic Peptide 1,197.3 (*)    All other components within normal limits  TROPONIN I - Abnormal; Notable for the following components:   Troponin I 0.16 (*)    All other components within normal limits  CBC WITH DIFFERENTIAL/PLATELET -  Abnormal; Notable for the following components:   RBC 1.94 (*)    Hemoglobin 5.7 (*)    HCT 18.8 (*)    RDW 25.3 (*)    Platelets 124 (*)    Lymphs Abs 0.4 (*)    All other components within normal limits  URINALYSIS, ROUTINE W REFLEX MICROSCOPIC  TYPE AND SCREEN  PREPARE RBC (CROSSMATCH)    EKG EKG Interpretation  Date/Time:  Friday Jun 23 2017 05:34:15 EDT Ventricular Rate:  95 PR Interval:    QRS Duration: 108 QT Interval:  379 QTC Calculation: 477 R Axis:   -26 Text Interpretation:  Sinus or ectopic atrial rhythm Multiple premature complexes, vent & supraven Probable left atrial enlargement Borderline left axis deviation Abnormal R-wave progression, late transition Abnormal lateral Q waves ST-t wave abnormality Abnormal ekg Confirmed by Carmin Muskrat [phone]) on 06/23/2017 5:39:47 AM   EMS rhythm strip with rate 95, sinus ST-T wave changes, nonspecific, left axis deviation abnormal   Radiology Dg Chest Port 1 View  Result Date: 06/23/2017 CLINICAL DATA:  Weakness EXAM: PORTABLE CHEST 1 VIEW COMPARISON:  11/18/2016 FINDINGS: Postsurgical changes of the mediastinum with valve prosthetic. Right-sided central venous port tip overlies the proximal right atrium. Cardiomegaly with vascular congestion and mild diffuse  interstitial opacity, likely mild edema. No pneumothorax. IMPRESSION: Cardiomegaly with vascular congestion and mild pulmonary edema Electronically Signed   By: Donavan Foil M.D.   On: 06/23/2017 03:00    Procedures Procedures (including critical care time)  Medications Ordered in ED Medications  furosemide (LASIX) injection 20 mg (has no administration in time range)  traMADol (ULTRAM) tablet 50 mg (50 mg Oral Given 06/23/17 0358)  0.9 %  sodium chloride infusion (10 mL/hr Intravenous New Bag/Given 06/23/17 0453)  ondansetron (ZOFRAN) injection 4 mg (4 mg Intravenous Given 06/23/17 0518)     Initial Impression / Assessment and Plan / ED Course  I have reviewed  the triage vital signs and the nursing notes.  Pertinent labs & imaging results that were available during my care of the patient were reviewed by me and considered in my medical decision making (see chart for details).     Initial labs notable for hemoglobin 5.7 On repeat exam the patient's wife is now present, she notes that he has had this value previously.  When she confirms that the patient receives twice weekly transfusion due to chronic GI bleed, and is followed at [facility]. Neither he nor she are aware of the treatment plan for his chronic GI bleed, requirement for transfusions, multiple myeloma, hepatobiliary dysfunction  Labs also notable for elevated troponin, BNP.   5:17 AM Patient restless, pale. He has some mild hypotension. Patient has not had a type and screen, will initiate transfusion for his critical abnormal hemoglobin, which seems to be a recurrent value, given his history of recurrent/chronic GI bleed. Given the patient's elevated BNP, there is concern for fluid overload status, the patient will receive Lasix as well Given the need for close monitoring during transfusion, and for further evaluation and management, the patient will be admitted for further evaluation and management.  He continues to deny chest pain, does describe ongoing back pain, which is chronic for him. I discussed all findings with the patient and his wife, need for careful transfusion given his elevated BNP, concern for his history of valvular disease as well. Patient remains hypotensive, and given his hypotension, elevated troponin, need for transfusion, critically abnormal hemoglobin, the patient is not stable for transfer back to [facility] currently. Patient will require admission to the stepdown unit here.  Final Clinical Impressions(s) / ED Diagnoses  Symptomatic anemia  CRITICAL CARE Performed by: Carmin Muskrat Total critical care time: 35 minutes Critical  care time was exclusive of separately billable procedures and treating other patients. Critical care was necessary to treat or prevent imminent or life-threatening deterioration. Critical care was time spent personally by me on the following activities: development of treatment plan with patient and/or surrogate as well as nursing, discussions with consultants, evaluation of patient's response to treatment, examination of patient, obtaining history from patient or surrogate, ordering and performing treatments and interventions, ordering and review of laboratory studies, ordering and review of radiographic studies, pulse oximetry and re-evaluation of patient's condition.    Carmin Muskrat, MD 06/23/17 1191    Carmin Muskrat, MD 06/23/17 [phone]

## 2017-06-24 DIAGNOSIS — I9589 Other hypotension: Secondary | ICD-10-CM

## 2017-06-24 DIAGNOSIS — E861 Hypovolemia: Secondary | ICD-10-CM

## 2017-06-24 DIAGNOSIS — K811 Chronic cholecystitis: Secondary | ICD-10-CM

## 2017-06-24 DIAGNOSIS — Z7189 Other specified counseling: Secondary | ICD-10-CM

## 2017-06-24 DIAGNOSIS — Z515 Encounter for palliative care: Secondary | ICD-10-CM

## 2017-06-24 LAB — BASIC METABOLIC PANEL
Anion gap: 10 (ref 5–15)
BUN: 35 mg/dL — ABNORMAL HIGH (ref 6–20)
CALCIUM: 9.6 mg/dL (ref 8.9–10.3)
CHLORIDE: 98 mmol/L — AB (ref 101–111)
CO2: 26 mmol/L (ref 22–32)
CREATININE: 0.76 mg/dL (ref 0.61–1.24)
Glucose, Bld: 125 mg/dL — ABNORMAL HIGH (ref 65–99)
Potassium: 3.8 mmol/L (ref 3.5–5.1)
SODIUM: 134 mmol/L — AB (ref 135–145)

## 2017-06-24 LAB — CBC
HCT: 22.2 % — ABNORMAL LOW (ref 39.0–52.0)
HEMATOCRIT: 21.7 % — AB (ref 39.0–52.0)
HEMOGLOBIN: 7 g/dL — AB (ref 13.0–17.0)
Hemoglobin: 7 g/dL — ABNORMAL LOW (ref 13.0–17.0)
MCH: 31 pg (ref 26.0–34.0)
MCH: 30 pg (ref 26.0–34.0)
MCHC: 32.3 g/dL (ref 30.0–36.0)
MCHC: 31.5 g/dL (ref 30.0–36.0)
MCV: 96 fL (ref 78.0–100.0)
MCV: 95.3 fL (ref 78.0–100.0)
PLATELETS: 104 10*3/uL — AB (ref 150–400)
Platelets: 93 10*3/uL — ABNORMAL LOW (ref 150–400)
RBC: 2.26 MIL/uL — AB (ref 4.22–5.81)
RBC: 2.33 MIL/uL — AB (ref 4.22–5.81)
RDW: 22.6 % — ABNORMAL HIGH (ref 11.5–15.5)
RDW: 23.2 % — AB (ref 11.5–15.5)
WBC: 4.7 10*3/uL (ref 4.0–10.5)
WBC: 6.2 10*3/uL (ref 4.0–10.5)

## 2017-06-24 LAB — GLUCOSE, CAPILLARY
GLUCOSE-CAPILLARY: 122 mg/dL — AB (ref 65–99)
GLUCOSE-CAPILLARY: 125 mg/dL — AB (ref 65–99)
Glucose-Capillary: 127 mg/dL — ABNORMAL HIGH (ref 65–99)
Glucose-Capillary: 119 mg/dL — ABNORMAL HIGH (ref 65–99)

## 2017-06-24 LAB — PREPARE RBC (CROSSMATCH)

## 2017-06-24 LAB — LACTATE DEHYDROGENASE: LDH: 1140 U/L — AB (ref 98–192)

## 2017-06-24 MED ORDER — SODIUM CHLORIDE 0.9 % IV SOLN
Freq: Once | INTRAVENOUS | Status: AC
  Administered 2017-06-24: 11:00:00 via INTRAVENOUS

## 2017-06-24 MED ORDER — GABAPENTIN 400 MG PO CAPS
400.0000 mg | ORAL_CAPSULE | Freq: Two times a day (BID) | ORAL | Status: DC
  Administered 2017-06-25 – 2017-06-29 (×9): 400 mg via ORAL
  Filled 2017-06-24 (×9): qty 1

## 2017-06-24 MED ORDER — GABAPENTIN 400 MG PO CAPS
800.0000 mg | ORAL_CAPSULE | Freq: Two times a day (BID) | ORAL | Status: DC
  Administered 2017-06-24 – 2017-06-28 (×9): 800 mg via ORAL
  Filled 2017-06-24 (×10): qty 2

## 2017-06-24 NOTE — Progress Notes (Signed)
Initial Nutrition Assessment  DOCUMENTATION CODES:  Severe malnutrition in context of chronic illness  INTERVENTION:  Magic cup TID with meals, each supplement provides 290 kcal and 9 grams of protein  Patient will not drink ENsure/Boost. Recommend diet liberalization to REGULAR as the majority of items patient expresses a desire for are limited on current diet order  Spent considerable time educating spouse/sister on an appropriate diet for the patient, what foods he should prioritize and what their nutritional goals should be  NUTRITION DIAGNOSIS:  Severe Malnutrition related to cancer and cancer related treatments, chronic illnesses (anemia, HF), poor appetite as evidenced by severe muscle depletion, severe fat depletion.  GOAL:  Patient will meet greater than or equal to 90% of their needs  MONITOR:  PO intake, Supplement acceptance, Diet advancement, Labs, Weight trends  REASON FOR ASSESSMENT:  Malnutrition Screening Tool    ASSESSMENT:  72 y/o male PMHx Severe Aortic stenosis s/p AV replacement, HF, Refractory MM w/ tx on hold due to anemia, Chronic GIB, cholecystitis s/p cholecystectomy tube, Recent hospitalization for hemorrhagic shock/Nstemi. Presents to ED with fatigue. Was worked up for repeated severe anemia. Admitted for management.   Patient seen with spouse and sister, whom provide the majority of history, as patient severely lethargic.   Spouse reports a history of generalized decline in past few months. The patient has a chronically poor appetite, mostly due to dysgeusia and hypogeusia, even though he has not had chemo since January.   Went through diet recall in attempt to determine intake level. He sounds to eat a good breakfast, however, he only eats ~half of a plate for lunch and dinner, and sounds to mostly eat vegetables. Spouse provides some SF foods in attempt to control his sugars. Also trys to watch salt. Patient eats very little protein and "will not drink  Ensure or Boost". She says she fixes a great variety of items for patient, but the majority of items mentioned are lower kcal/low pro ie vegetables. Pt sister notes that when patient was younger, he was told to avoid red meat and he has done that since the 1980s. He takes b12, iron supps.   Wt wise, the spouse reports some relative stability recently and notes that when patient does gain weight, it is fluid related. Per [facility] chart hx, he has been ~170 lbs since last August. However, per care everywhere, was able to view [facility] RD note from 4/19. Her note shows patients April weights to be 155-160 lbs.   Physical exam: Patient has significant edema to all four extremities, likely cause of elevated weight (alb 2.8). He has severe muscle wasting of scapula, deltoids, clavicular musculature and temporalis. Severe orbital/thoracic fat wasting. Moderate buccal fat wasting.   RD spent time with spouse attempting to refocus her diet objectives for patient from low salt/sugar to high kcals/pro. She is to worried about his sugars. Emphasized that priority should be on weight gain/maintenance. Pt should eat full fat/kcal items. She should prioritize dense carbs/high fat/protein foods and try to limit about of vegetables she gives patient. We discussed alternative to supplements. Patient likes choc milk/ice cream at home. Recommended eating this often. Patient sounds like he may like magic cup, will order. Recommend diet liberalization to Regular.   Labs: H/H:7/22.2, Glu: 120-145, Albumin: 2.8,  Meds: PPI, Insulin, PPI, PRBCs   Recent Labs  Lab 06/23/17 0253 06/24/17 0508  NA 132* 134*  K 3.6 3.8  CL 97* 98*  CO2 22 26  BUN 24* 35*  CREATININE 0.52* 0.76  CALCIUM 9.4 9.6  GLUCOSE 129* 125*   NUTRITION - FOCUSED PHYSICAL EXAM:   Most Recent Value  Orbital Region  Severe depletion  Upper Arm Region  Unable to assess [Edema]  Thoracic and Lumbar Region  Severe depletion  Buccal Region  Moderate  depletion  Temple Region  Severe depletion  Clavicle Bone Region  Severe depletion  Clavicle and Acromion Bone Region  Severe depletion  Scapular Bone Region  Severe depletion  Dorsal Hand  Unable to assess [Edema to all extremities]  Patellar Region  Unable to assess  Anterior Thigh Region  Unable to assess  Posterior Calf Region  Unable to assess  Edema (RD Assessment)  Severe     Diet Order:   Diet Order           Diet regular Room service appropriate? Yes; Fluid consistency: Thin  Diet effective now         EDUCATION NEEDS:  Education needs have been addressed  Skin: Generalized ecchymosis  Last BM:  5/16  Height:  Ht Readings from Last 1 Encounters:  06/23/17 5\' 9"  (1.753 m)   Weight:  Wt Readings from Last 1 Encounters:  06/24/17 169 lb 8 oz (76.9 kg)   Wt Readings from Last 10 Encounters:  06/24/17 169 lb 8 oz (76.9 kg)  06/05/17 176 lb (79.8 kg)  02/02/17 174 lb (78.9 kg)  12/27/16 177 lb (80.3 kg)  11/29/16 167 lb 12.8 oz (76.1 kg)  11/21/16 168 lb (76.2 kg)  11/18/16 167 lb (75.8 kg)  10/24/16 168 lb (76.2 kg)  10/04/16 168 lb 14.4 oz (76.6 kg)  09/25/16 177 lb 4.8 oz (80.4 kg)   Ideal Body Weight:  106 kg  BMI:  Body mass index is 25.03 kg/m.  Used dosing weight of 160 lbs (72.73 kg) given current edema Estimated Nutritional Needs:  Kcal:  1950-2200 kcals (27-30 kcal/kg dosing bw) Protein:  95-110g (1.3-1.5 g/kg bw) Fluid:  >2.2 L (30 ml/kg dosing BW)  Burtis Junes RD, LDN, CNSC Clinical Nutrition Available Tues-Sat via Pager: [phone] 06/24/2017 4:41 PM

## 2017-06-24 NOTE — Consult Note (Signed)
Consultation Note Date: 06/24/2017   Patient Name: [name]  DOB: [birth year]  MRN: [id number]  Age / Sex: 72 y.o., male  [facility] Referring Physician: Mariel Aloe, MD  Reason for Consultation: Establishing goals of care and Psychosocial/spiritual support  HPI/Patient Profile: 72 y.o. 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  Hemoglobin on admission was 5.7.  Patient is being transfused approximately 4 units a week.  He has been transfused twice weekly now for almost a year.  His wife, and his sister, are both at the bedside.  He is followed at [facility] by Dr. Norma Fredrickson for his multiple myeloma.  [facility] [place] [facility]. .   Consult ordered for goals of care  Clinical Assessment and Goals of Care: Met with patient, his wife and sister as well as chart reviewed.  Patient is very weak, and only able to minimally participate in discussion.  He is currently receiving 2 units of packed red blood cells and is still very lethargic.  He has been undergoing transfusions approximately twice weekly now for almost a year.  His wife shares that he does not seem to notice any improvement after transfusion but he has verbalized to her that he wishes to continue in order to meet some very specific short-term goals.  He has not had a transfusion reaction today.  She shares his immediate goals are to see his great niece graduate July 15, 2017; he wants to go to [place] with his wife and ordered for her to see her aunt.  Additionally on July 2.  He is wanting to go to [place] to the 45th anniversary [employer].  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  Introduce concepts of goals of care.  Despite patient's extensive medical involvement and underlying diagnoses, his wife states that they have not discussed CODE STATUS or what to do in "worst-case scenarios".  She states they have candidly talked about funeral arrangements but not things such as CPR, defibrillation or intubation.  She tells me she does not think he would want to be intubated and it would only want "to press on his chest 2 times and then stop".  I shared with her my concern that given his underlying myeloma and high risk for hemorrhage that CPR and aggressive measures would do more harm than providing good or enable him to meet the short-term goals that he has verbalized.  Patient's sister was present for this part of the conversation as well and acknowledges she does not feel like full aggressive measures such as a code would benefit patient  and was verbalizing agreement for DNR.  Wife agreed to review [employer] Cho[employer] for [employer] booklet as well as a MOST form.  Patient at this point is very weak and unable to participate extensively in healthcare decisions.  His healthcare proxy would be his wife, Payam Gribble, [phone].    SUMMARY OF RECOMMENDATIONS   Continue full code full scope of treatment for now Palliative medicine to follow-up with family on 06/25/2017 for further goals of care discussion He will continue to follow with Dr. Norma Fredrickson at [facility] for treatment of multiple myeloma and hematology services for transfusions through the [facility]:  Full code    Symptom Management:   Pain: Continue with morphine 7.5 every 4  hours as needed.  Per chart review patient has been on MSIR 15 mg every 4 hours as needed over the past several years.  He also has extensive neuropathic pain and is on gabapentin 400 mg in the morning 800 mg at noon as well as at 6 PM, 400 mg at bedtime.  Would recommend changing his  bedtime dosing to 800 mg and his 6 PM dose to 400 mg.  Patient is verbalizing difficulty sleeping because of restless legs  Insomnia: Recommended changing dosing of gabapentin to 800 mg nightly.  Continue trazodone but this dose could be pressed to 50 mg at bedtime with an option to repeat in 1 hour if needed.    Restless legs: Patient is currently on Sinemet for restless leg syndrome.  He does not have Parkinson's disease.  Klonopin as well as Requip are also additional options for treatment of restless legs  Palliative Prophylaxis:   Aspiration, Bowel Regimen, Delirium Protocol, Eye Care, Frequent Pain Assessment, Oral Care and Turn Reposition  Additional Recommendations (Limitations, Scope, Preferences):  Full Scope Treatment  Psycho-social/Spiritual:   Desire for further Chaplaincy support:no  Additional Recommendations: Referral to Community Resources   Prognosis:   Unable to determine  Discharge Planning: Home with Home Health      Primary Diagnoses: Present on Admission: . Symptomatic anemia . GI bleed . Multiple myeloma not having achieved remission ([place]) . Paravalvular leak of prosthetic heart valve . Type II diabetes mellitus with complication (HCC)   I have reviewed the medical record, interviewed the patient and family, and examined the patient. The following aspects are pertinent.  Past Medical History:  Diagnosis Date  . Allergic rhinitis   . Anemia 2009  . Aortic stenosis    a. s/p porcine AVR 2006;  b. s/p redo tissue AVR 12/2011 (Dr. Roxy Manns) - preAVR LHC with no CAD  . Asthma   . BPH (benign prostatic hypertrophy)   . Cancer ([place])    multiple myeloma  . Cataract   .  Chronic cough   . Chronic interstitial cystitis   . Depression    pt denies  . Diabetes mellitus    type 2  . Diabetes mellitus without complication ([place])   . Diverticulosis of colon    on colonoscopy 2008  . GERD (gastroesophageal reflux disease)    bravo pH study 2008  . Gout   . H/O aortic valve replacement with porcine valve    2006, 2013  . Headache(784.0)   . Hemorrhoids    external and internal  . Hiatal hernia   . HTN (hypertension)   . Hx of echocardiogram    a. Echo  (post AVR) 12/2011:  mod LVH, EF 60-65%, Gr 2 diast dysfn, mild AI, AVR  ok (mean gradient 19 mmHg), MAC, mild BAE  . Hyperlipidemia   . Hypertension   . Hyperthyroidism   . IBS (irritable bowel syndrome)   . Insomnia   . Lower GI bleed 07/16/2015  . Multiple myeloma ([place])   . OA (osteoarthritis)   . OSA (obstructive sleep apnea)    USES CPAP AS NEEDED  . 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 Periodontitis    chronic with bone loss  . Restless leg syndrome   . S/P aortic valve replacement with bioprosthetic valve 12/06/2011   Redo AVR using 23 mm Edwards Magna Ease pericardial tissue valve   Social History   Socioeconomic History  . Marital status: Married    Spouse name: Not on file  . Number of children: 0  . Years of education: Not on file  . Highest education level: Not on file  Occupational History  . Occupation: retired  Scientific laboratory technician  . Financial resource strain: Not on file  . Food insecurity:    Worry: Not on file    Inability: Not on file  . Transportation needs:    Medical: Not on file    Non-medical: Not on file  Tobacco Use  . Smoking status: Former Smoker    Last attempt to quit: 09/30/1971    Years since quitting: 45.7  . Smokeless tobacco: Never Used  . Tobacco comment: Quit August 973  Substance and Sexual Activity  . Alcohol use: No  . Drug use: No  . Sexual activity: Not Currently  Lifestyle  . Physical activity:    Days per week: Not on file      Minutes per session: Not on file  . Stress: Not on file  Relationships  . Social connections:    Talks on phone: Not on file    Gets together: Not on file    Attends religious service: Not on file    Active member of club or organization: Not on file    Attends meetings of clubs or organizations: Not on file    Relationship status: Not on file  Other Topics Concern  . Not on file  Social History Narrative   ** Merged History Encounter **       Family History  Problem Relation Age of Onset  . Heart disease Father   . Osteoarthritis Mother   . Hypertension Sister   . Hyperlipidemia Unknown   . Cancer Maternal Aunt    Scheduled Meds: . carbidopa-levodopa  1 tablet Oral TID  . finasteride  5 mg Oral Daily  . gabapentin  400 mg Oral BID  . gabapentin  800 mg Oral BID AC  . insulin aspart  0-9 Units Subcutaneous Q6H  . [START ON 06/26/2017] pantoprazole  40 mg Intravenous Q12H  . sodium chloride flush  3 mL Intravenous Q12H  . sodium chloride flush  3 mL Intravenous Q12H  . traZODone  50 mg Oral QHS   Continuous Infusions: . sodium chloride    . sodium chloride Stopped (06/23/17 1721)  . pantoprozole (PROTONIX) infusion 8 mg/hr (06/24/17 0401)   PRN Meds:.sodium chloride, acetaminophen **OR** acetaminophen, morphine, ondansetron, polyvinyl alcohol, sodium chloride flush, sodium chloride flush, traMADol Medications Prior to Admission:  Prior to Admission medications   Medication Sig Start Date End Date Taking? Authorizing Provider  acetaminophen (TYLENOL) 325 MG tablet Take 650 mg by mouth every 6 (six) hours as needed for fever.    Yes [provider]  carbidopa-levodopa (SINEMET IR) 25-100  MG tablet Take 1 tablet by mouth 3 (three) times daily.    Yes [provider]  cyanocobalamin 500 MCG tablet Take 500 mcg by mouth 2 (two) times daily. Lunch/dinner   Yes [provider]  docusate sodium (COLACE) 100 MG capsule Take 100 mg by mouth 2 (two)  times daily as needed for moderate constipation.  08/20/15  Yes [provider]  finasteride (PROSCAR) 5 MG tablet Take 5 mg by mouth daily.   Yes [provider]  fish oil-omega-3 fatty acids 1000 MG capsule Take 1 g by mouth daily with lunch.    Yes [provider]  folic acid (FOLVITE) 1 MG tablet Take 1 mg by mouth daily. 09/24/15  Yes [provider]  furosemide (LASIX) 40 MG tablet TAKE 2 TABLETS (80 MG TOTAL) BY MOUTH DAILY. TAKE A EXTRA TABLET ON DAYS OF DIALYSIS Patient taking differently: Take 60 mg by mouth 2 (two) times daily.  05/15/17  Yes Almyra Deforest, PA  gabapentin (NEURONTIN) 800 MG tablet Take 400-800 mg by mouth See admin instructions. Take 1/2 tablet every morning and at bedtime then take 1 tablet at lunch and dinner   Yes [provider]  ixazomib citrate (NINLARO) 3 MG capsule Take 3 mg by mouth once a week. Off on days 1, 8 and 15 of a 28 day cycle. Take on an empty stomach 1hr before or 2hrs after food. Do not crush, chew, or open.   Yes [provider]  metFORMIN (GLUCOPHAGE) 500 MG tablet Take 500 mg by mouth 2 (two) times daily with a meal.   Yes [provider]  morphine (MSIR) 15 MG tablet Take 7.5 mg by mouth every 4 (four) hours as needed for moderate pain.    Yes [provider]  ondansetron (ZOFRAN) 4 MG tablet Take 4 mg by mouth every 8 (eight) hours as needed for nausea or vomiting.   Yes [provider]  pantoprazole (PROTONIX) 40 MG tablet Take 1 tablet (40 mg total) by mouth daily. For GERD Patient taking differently: Take 40 mg by mouth daily with lunch. For GERD 07/16/15  Yes Rai, Ripudeep K, MD  Polyvinyl Alcohol-Povidone PF (REFRESH) 1.4-0.6 % SOLN Place 1 drop into both eyes 2 (two) times daily as needed (dry eyes).   Yes [provider]  simvastatin (ZOCOR) 10 MG tablet Take 10 mg by mouth at bedtime.  09/24/15  Yes [provider]  traMADol (ULTRAM) 50 MG tablet Take  50 mg by mouth every 6 (six) hours as needed for moderate pain.   Yes [provider]  traZODone (DESYREL) 50 MG tablet Take 50 mg by mouth at bedtime.   Yes [provider]  UNABLE TO FIND Place 1 application into the right eye daily as needed (eye care). Ocular Ointment    Yes [provider]  zinc oxide 20 % ointment Apply 1 application topically 2 (two) times daily as needed for irritation.   Yes [provider]   Allergies  Allergen Reactions  . Avodart [Dutasteride] Other (See Comments)    Lose of use of arms and legs   . Spironolactone Other (See Comments)    Low tolerance   . Codeine Other (See Comments)    GI upset in large doses  . Doxazosin Other (See Comments)    Dizziness   . Feraheme [Ferumoxytol] Swelling    Pedal edema   Review of Systems  Unable to perform ROS: Acuity of condition  Physical Exam  Constitutional:  Acutely ill-appearing older man, very weak, receiving a blood transfusion  HENT:  Head: Normocephalic and atraumatic.  Neck: Normal range of motion.  Cardiovascular: Normal rate.  Pulmonary/Chest:  Mild increased work of breathing at rest  Abdominal: Soft.  Musculoskeletal: Normal range of motion.  Neurological:  Lethargic Vocal quality weak, can answer simple yes and no questions Readily falls asleep Severe restless leg secondary to anemia  Skin:  Cool, pale  Psychiatric:  Affect constricted me: States "I that feel terrible"  Nursing note and vitals reviewed.   Vital Signs: BP (!) 107/51   Pulse 76   Temp 98.1 F (36.7 C) (Axillary)   Resp 11   Ht _0  (1.753 m)   Wt 77.1 kg (170 lb)   SpO2 98%   BMI 25.10 kg/m  Pain Scale: 0-10 POSS *See Group Information*: S-Acceptable,Sleep, easy to arouse Pain Score: Asleep   SpO2: SpO2: 98 % O2 Device:SpO2: 98 % O2 Flow Rate: .O2 Flow Rate (L/min): 0 L/min  IO: Intake/output summary:   Intake/Output Summary (Last 24 hours) at 06/24/2017 1440 Last  data filed at 06/24/2017 0516 Gross per 24 hour  Intake 565.42 ml  Output -  Net 565.42 ml    LBM: Last BM Date: 06/22/17 Baseline Weight: Weight: 77.1 kg (170 lb) Most recent weight: Weight: 77.1 kg (170 lb)     Palliative Assessment/Data:   Flowsheet Rows     Most Recent Value  Intake Tab  Referral Department  Hospitalist  Unit at Time of Referral  Med/Surg Unit  Palliative Care Primary Diagnosis  Cancer  Date Notified  06/23/17  Palliative Care Type  New Palliative care  Reason for referral  Clarify Goals of Care  Date of Admission  06/23/17  Date first seen by Palliative Care  06/24/17  # of days Palliative referral response time  1 Day(s)  # of days IP prior to Palliative referral  0  Clinical Assessment  Palliative Performance Scale Score  40%  Pain Max last 24 hours  Not able to report  Pain Min Last 24 hours  Not able to report  Dyspnea Max Last 24 Hours  Not able to report  Dyspnea Min Last 24 hours  Not able to report  Nausea Max Last 24 Hours  Not able to report  Nausea Min Last 24 Hours  Not able to report  Anxiety Max Last 24 Hours  Not able to report  Anxiety Min Last 24 Hours  Not able to report  Other Max Last 24 Hours  Not able to report  Psychosocial & Spiritual Assessment  Palliative Care Outcomes  Patient/Family meeting held?  Yes  Who was at the meeting?  pt, wife and pt's sister  Palliative Care follow-up planned  Yes, Facility      Time In: 1300 Time Out: 1415 Time Total: 75 min Greater than 50%  of this time was spent counseling and coordinating care related to the above assessment and plan.  Signed by: Dory Horn, NP   Please contact Palliative Medicine Team phone at [phone] for questions and concerns.  For individual provider: See Shea Evans

## 2017-06-24 NOTE — Progress Notes (Signed)
PROGRESS NOTE    [name]  EOF:[id number] DOB: 1945/10/20 DOA: 06/23/2017 [facility]: [facility]   Brief Narrative: [name] is a 72 y.o. 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  Patient presented secondary to hypotension found to be severely anemic.   Assessment & Plan:   Principal Problem:   Symptomatic anemia Active Problems:   Aortic stenosis   Multiple myeloma not having achieved remission (HCC)   GI bleed   Paravalvular leak of prosthetic heart valve   Type II diabetes mellitus with complication (HCC)   Demand ischemia (HCC)   Hyponatremia   Symptomatic anemia Multifactorial.  Status post 2 units of PRBC on 5/17. -Give 2 more units of PRBC -Palliative care consult -PT eval -repeat CBC -LDH/haptoglobin pending  Aortic stenosis Chronic.  Status post valve replacement.  Elevated troponin Cardiology consulted on 5/17.  Thought secondary to demand ischemia in setting of severe illness.  Troponin elevated to over 3 today.  Discussed with cardiology via telephone and still likely related to demand ischemia in the setting of acute illness.  Recommendation is still for medical management as patient is not an Intervention candidate.  Diabetes mellitus, type 2 -Continue sliding scale insulin  Chronic cholecystitis Patient is status post cholecystostomy tube.  Patient with persistent elevated AST, ALT, alk phos, bilirubin.  Currently stable.  Multiple myeloma Patient follows with [facility] as an outpatient.  Currently not on therapy secondary to recent anemia issues.  Hypotension Secondary to significant anemia in addition to significant aortic stenosis.  Stable.  Thrombocytopenia Stable. No overt bleeding.   DVT prophylaxis: SCDs Code Status:   Code Status: Full Code Family  Communication: Wife at bedside Disposition Plan: Discharge likely home possibly in 24 hours when medically stable   Consultants:   Cardiology  Palliative care medicine  Procedures:   PRBC x2 units (5/17)  Antimicrobials:  None    Subjective: Fatigued. No chest pain or dyspnea.  Objective: Vitals:   06/23/17 2355 06/24/17 0047 06/24/17 0321 06/24/17 0659  BP: (!) 87/48 (!) 98/49 98/65   Pulse: 77 81 77   Resp: 13 14 17    Temp:    97.9 F (36.6 C)  TempSrc:      SpO2: 97% 100% 100%   Weight:      Height:        Intake/Output Summary (Last 24 hours) at 06/24/2017 0822 Last data filed at 06/24/2017 0516 Gross per 24 hour  Intake 1256.92 ml  Output -  Net 1256.92 ml   Filed Weights   06/23/17 0158  Weight: 77.1 kg (170 lb)    Examination:  General exam: Appears calm and comfortable Respiratory system: Clear to auscultation. Respiratory effort normal. Cardiovascular system: S1 & S2 heard, RRR. 3/6 harsh systolic murmur. Gastrointestinal system: Abdomen is nondistended, soft and nontender. Normal bowel sounds heard. Central nervous system: Alert and oriented. No focal neurological deficits. Extremities: No edema. No calf tenderness Skin: Pale and jaundiced Psychiatry: Judgement and insight appear normal. Mood & affect appropriate.     Data Reviewed: I have personally reviewed following labs and imaging studies  CBC: Recent Labs  Lab 06/23/17 0253 06/23/17 0552 06/23/17 1905 06/24/17 0107 06/24/17 0508  WBC 6.7 7.8 6.2 4.7 6.2  NEUTROABS 5.8  --   --   --   --   HGB 5.7* 5.9* 7.4* 7.0* 7.0*  HCT 18.8* 19.5* 23.4* 21.7* 22.2*  MCV 96.9 98.0 94.0 96.0 95.3  PLT 124* 154 121* 93* 086*   Basic Metabolic Panel: Recent Labs  Lab 06/23/17 0253 06/24/17 0508  NA 132* 134*  K 3.6 3.8  CL 97* 98*  CO2 22 26  GLUCOSE 129* 125*  BUN 24* 35*  CREATININE 0.52* 0.76  CALCIUM 9.4 9.6   GFR: Estimated Creatinine Clearance: 85.9 mL/min (by C-G formula  based on SCr of 0.76 mg/dL). Liver Function Tests: Recent Labs  Lab 06/23/17 0253  AST 45*  ALT 13*  ALKPHOS 96  BILITOT 3.6*  PROT 5.3*  ALBUMIN 2.8*   No results for input(s): LIPASE, AMYLASE in the last 168 hours. No results for input(s): AMMONIA in the last 168 hours. Coagulation Profile: No results for input(s): INR, PROTIME in the last 168 hours. Cardiac Enzymes: Recent Labs  Lab 06/23/17 0253 06/23/17 0552 06/23/17 1905  TROPONINI 0.16* 0.49* 3.10*   BNP (last 3 results) No results for input(s): PROBNP in the last 8760 hours. HbA1C: No results for input(s): HGBA1C in the last 72 hours. CBG: Recent Labs  Lab 06/23/17 1125 06/23/17 1651 06/24/17 0028 06/24/17 0718  GLUCAP 144* 140* 127* 122*   Lipid Profile: No results for input(s): CHOL, HDL, LDLCALC, TRIG, CHOLHDL, LDLDIRECT in the last 72 hours. Thyroid Function Tests: No results for input(s): TSH, T4TOTAL, FREET4, T3FREE, THYROIDAB in the last 72 hours. Anemia Panel: No results for input(s): VITAMINB12, FOLATE, FERRITIN, TIBC, IRON, RETICCTPCT in the last 72 hours. Sepsis Labs: No results for input(s): PROCALCITON, LATICACIDVEN in the last 168 hours.  Recent Results (from the past 240 hour(s))  MRSA PCR Screening     Status: None   Collection Time: 06/23/17  7:20 PM  Result Value Ref Range Status   MRSA by PCR NEGATIVE NEGATIVE Final    Comment:        The GeneXpert MRSA Assay (FDA approved for NASAL specimens only), is one component of a comprehensive MRSA colonization surveillance program. It is not intended to diagnose MRSA infection nor to guide or monitor treatment for MRSA infections. Performed at [facility], [place] [address] [place], [place] [address]          Radiology Studies: Dg Chest Port 1 View  Result Date: 06/23/2017 CLINICAL DATA:  Weakness EXAM: PORTABLE CHEST 1 VIEW COMPARISON:  11/18/2016 FINDINGS: Postsurgical changes of the mediastinum with valve prosthetic.  Right-sided central venous port tip overlies the proximal right atrium. Cardiomegaly with vascular congestion and mild diffuse interstitial opacity, likely mild edema. No pneumothorax. IMPRESSION: Cardiomegaly with vascular congestion and mild pulmonary edema Electronically Signed   By: Donavan Foil M.D.   On: 06/23/2017 03:00        Scheduled Meds: . carbidopa-levodopa  1 tablet Oral TID  . finasteride  5 mg Oral Daily  . gabapentin  400 mg Oral BID  . gabapentin  800 mg Oral BID AC  . insulin aspart  0-9 Units Subcutaneous Q6H  . [START ON 06/26/2017] pantoprazole  40 mg Intravenous Q12H  . sodium chloride flush  3 mL Intravenous Q12H  . sodium chloride flush  3 mL Intravenous Q12H  . traZODone  50 mg Oral QHS   Continuous Infusions: . sodium chloride    . sodium chloride Stopped (06/23/17 1721)  . sodium chloride    . pantoprozole (PROTONIX) infusion 8 mg/hr (06/24/17 0401)     LOS: 1 day     Cordelia Poche, MD [facility] 06/24/2017, 8:22 AM Pager: [phone]  If 7PM-7AM, please contact night-coverage [email or web address] 06/24/2017, 8:22 AM

## 2017-06-25 DIAGNOSIS — E43 Unspecified severe protein-calorie malnutrition: Secondary | ICD-10-CM

## 2017-06-25 LAB — TYPE AND SCREEN
ABO/RH(D): A NEG
Antibody Screen: NEGATIVE
UNIT DIVISION: 0
UNIT DIVISION: 0
UNIT DIVISION: 0
Unit division: 0

## 2017-06-25 LAB — CBC
HCT: 27.3 % — ABNORMAL LOW (ref 39.0–52.0)
HEMATOCRIT: 28.6 % — AB (ref 39.0–52.0)
HEMOGLOBIN: 8.9 g/dL — AB (ref 13.0–17.0)
Hemoglobin: 9 g/dL — ABNORMAL LOW (ref 13.0–17.0)
MCH: 30.4 pg (ref 26.0–34.0)
MCH: 29.9 pg (ref 26.0–34.0)
MCHC: 32.6 g/dL (ref 30.0–36.0)
MCHC: 31.5 g/dL (ref 30.0–36.0)
MCV: 93.2 fL (ref 78.0–100.0)
MCV: 95 fL (ref 78.0–100.0)
PLATELETS: 110 10*3/uL — AB (ref 150–400)
Platelets: 98 10*3/uL — ABNORMAL LOW (ref 150–400)
RBC: 2.93 MIL/uL — ABNORMAL LOW (ref 4.22–5.81)
RBC: 3.01 MIL/uL — ABNORMAL LOW (ref 4.22–5.81)
RDW: 22.3 % — AB (ref 11.5–15.5)
RDW: 22.2 % — AB (ref 11.5–15.5)
WBC: 6.6 10*3/uL (ref 4.0–10.5)
WBC: 8 10*3/uL (ref 4.0–10.5)

## 2017-06-25 LAB — BPAM RBC
BLOOD PRODUCT EXPIRATION DATE: 201906012359
BLOOD PRODUCT EXPIRATION DATE: 201906142359
Blood Product Expiration Date: 201906012359
Blood Product Expiration Date: 201906142359
ISSUE DATE / TIME: 201905182036
ISSUE DATE / TIME: 201905170640
ISSUE DATE / TIME: 201905170957
ISSUE DATE / TIME: 201905181139
UNIT TYPE AND RH: 600
UNIT TYPE AND RH: 600
Unit Type and Rh: 600
Unit Type and Rh: 600

## 2017-06-25 LAB — GLUCOSE, CAPILLARY
GLUCOSE-CAPILLARY: 105 mg/dL — AB (ref 65–99)
GLUCOSE-CAPILLARY: 148 mg/dL — AB (ref 65–99)
GLUCOSE-CAPILLARY: 147 mg/dL — AB (ref 65–99)
Glucose-Capillary: 128 mg/dL — ABNORMAL HIGH (ref 65–99)
Glucose-Capillary: 154 mg/dL — ABNORMAL HIGH (ref 65–99)

## 2017-06-25 LAB — HAPTOGLOBIN: Haptoglobin: <10 mg/dL — ABNORMAL LOW (ref 34–200)

## 2017-06-25 MED ORDER — INSULIN ASPART 100 UNIT/ML ~~LOC~~ SOLN
0.0000 [IU] | Freq: Three times a day (TID) | SUBCUTANEOUS | Status: DC
  Administered 2017-06-25: 1 [IU] via SUBCUTANEOUS
  Administered 2017-06-25 – 2017-06-26 (×2): 2 [IU] via SUBCUTANEOUS
  Administered 2017-06-26 – 2017-06-27 (×2): 1 [IU] via SUBCUTANEOUS
  Administered 2017-06-28: 2 [IU] via SUBCUTANEOUS
  Administered 2017-06-28: 1 [IU] via SUBCUTANEOUS

## 2017-06-25 MED ORDER — INSULIN ASPART 100 UNIT/ML ~~LOC~~ SOLN: 0.0000 [IU] | Freq: Every day | SUBCUTANEOUS | Status: DC

## 2017-06-25 NOTE — Progress Notes (Signed)
PROGRESS NOTE    [name]  MHD:[id number] DOB: 01-08-1946 DOA: 06/23/2017 PCP: [facility]   Brief Narrative: [name] is a 72 y.o. 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  Patient presented secondary to hypotension found to be severely anemic.   Assessment & Plan:   Principal Problem:   Symptomatic anemia Active Problems:   Aortic stenosis   Multiple myeloma not having achieved remission (HCC)   GI bleed   Paravalvular leak of prosthetic heart valve   Type II diabetes mellitus with complication (HCC)   Demand ischemia (HCC)   Hyponatremia   Goals of care, counseling/discussion   Palliative care by specialist   Protein-calorie malnutrition, severe   Symptomatic anemia Multifactorial.  Status post 2 units of PRBC on 5/17 and another 2 units on 5/18. Good hemoglobin response. LDH elevated. Haptoglobin pending -Palliative care recommendations -PT eval pending -repeat CBC  Aortic stenosis Chronic.  Status post valve replacement.  Elevated troponin Cardiology consulted on 5/17.  Thought secondary to demand ischemia in setting of severe illness.  Troponin elevated to over 3 today.  Discussed with cardiology via telephone and still likely related to demand ischemia in the setting of acute illness.  Recommendation is still for medical management as patient is not an Intervention candidate.  Diabetes mellitus, type 2 -Continue sliding scale insulin (qAC&HS)  Chronic cholecystitis Patient is status post cholecystostomy tube.  Patient with persistent elevated AST, ALT, alk phos, bilirubin.  Currently stable.  Multiple myeloma Patient follows with [facility] as an outpatient.  Currently not on therapy secondary to recent anemia issues.  Hypotension Secondary to significant anemia in addition  to significant aortic stenosis.  Resolved.  Thrombocytopenia Stable. No overt bleeding.  Severe protein calorie malnutrition Seen by dietician. Diet liberalized. Protein supplements.   DVT prophylaxis: SCDs Code Status:   Code Status: Full Code Family Communication: Wife at bedside Disposition Plan: Transfer to medsurg status. Discharge likely home vs SNF possibly in 24 hours when medically stable and after PT evaluation   Consultants:   Cardiology  Palliative care medicine  Procedures:   PRBC x2 units (5/17)  Antimicrobials:  None    Subjective: Hungry.  Objective: Vitals:   06/25/17 0200 06/25/17 0400 06/25/17 0544 06/25/17 0600  BP: (!) 100/50 (!) 82/51 100/74 111/68  Pulse: 68 75 77   Resp: _0 Temp:      TempSrc:      SpO2: 97% 94% 95% 95%  Weight:      Height:        Intake/Output Summary (Last 24 hours) at 06/25/2017 0856 Last data filed at 06/25/2017 0846 Gross per 24 hour  Intake 671 ml  Output 1000 ml  Net -329 ml   Filed Weights   06/23/17 0158 06/24/17 1636  Weight: 77.1 kg (170 lb) 76.9 kg (169 lb 8 oz)    Examination:  General exam: Appears calm and comfortable. Muscle wasting. Respiratory system: Clear to auscultation. Respiratory effort normal. Cardiovascular system: S1 & S2 heard, RRR. 3/6 systolic murmur Gastrointestinal system: Abdomen is nondistended, soft and nontender. Normal bowel sounds heard. Cholecystostomy tube Central nervous system: Alert and oriented. No focal neurological deficits. Extremities: No edema. No calf tenderness Skin: Jaundice Psychiatry: Judgement and insight appear normal. Flat affect    Data Reviewed: I have personally reviewed following labs and imaging studies  CBC: Recent Labs  Lab 06/23/17 0253 06/23/17 0552 06/23/17 1905 06/24/17 0107 06/24/17 0508 06/25/17 0201  WBC 6.7 7.8 6.2 4.7 6.2 6.6  NEUTROABS 5.8  --   --   --   --   --   HGB 5.7* 5.9* 7.4* 7.0* 7.0* 8.9*  HCT 18.8*  19.5* 23.4* 21.7* 22.2* 27.3*  MCV 96.9 98.0 94.0 96.0 95.3 93.2  PLT 124* 154 121* 93* 104* 98*   Basic Metabolic Panel: Recent Labs  Lab 06/23/17 0253 06/24/17 0508  NA 132* 134*  K 3.6 3.8  CL 97* 98*  CO2 22 26  GLUCOSE 129* 125*  BUN 24* 35*  CREATININE 0.52* 0.76  CALCIUM 9.4 9.6   GFR: Estimated Creatinine Clearance: 85.9 mL/min (by C-G formula based on SCr of 0.76 mg/dL). Liver Function Tests: Recent Labs  Lab 06/23/17 0253  AST 45*  ALT 13*  ALKPHOS 96  BILITOT 3.6*  PROT 5.3*  ALBUMIN 2.8*   No results for input(s): LIPASE, AMYLASE in the last 168 hours. No results for input(s): AMMONIA in the last 168 hours. Coagulation Profile: No results for input(s): INR, PROTIME in the last 168 hours. Cardiac Enzymes: Recent Labs  Lab 06/23/17 0253 06/23/17 0552 06/23/17 1905  TROPONINI 0.16* 0.49* 3.10*   BNP (last 3 results) No results for input(s): PROBNP in the last 8760 hours. HbA1C: No results for input(s): HGBA1C in the last 72 hours. CBG: Recent Labs  Lab 06/24/17 0718 06/24/17 1215 06/24/17 1827 06/25/17 0024 06/25/17 0628  GLUCAP 122* 125* 119* 128* 105*   Lipid Profile: No results for input(s): CHOL, HDL, LDLCALC, TRIG, CHOLHDL, LDLDIRECT in the last 72 hours. Thyroid Function Tests: No results for input(s): TSH, T4TOTAL, FREET4, T3FREE, THYROIDAB in the last 72 hours. Anemia Panel: No results for input(s): VITAMINB12, FOLATE, FERRITIN, TIBC, IRON, RETICCTPCT in the last 72 hours. Sepsis Labs: No results for input(s): PROCALCITON, LATICACIDVEN in the last 168 hours.  Recent Results (from the past 240 hour(s))  MRSA PCR Screening     Status: None   Collection Time: 06/23/17  7:20 PM  Result Value Ref Range Status   MRSA by PCR NEGATIVE NEGATIVE Final    Comment:        The GeneXpert MRSA Assay (FDA approved for NASAL specimens only), is one component of a comprehensive MRSA colonization surveillance program. It is not intended to  diagnose MRSA infection nor to guide or monitor treatment for MRSA infections. Performed at [facility], [place] [address] [place], [place] [address]          Radiology Studies: No results found.      Scheduled Meds: . carbidopa-levodopa  1 tablet Oral TID  . finasteride  5 mg Oral Daily  . gabapentin  400 mg Oral BID   And  . gabapentin  800 mg Oral BID  . insulin aspart  0-9 Units Subcutaneous Q6H  . [START ON 06/26/2017] pantoprazole  40 mg Intravenous Q12H  . sodium chloride flush  3 mL Intravenous Q12H  . sodium chloride flush  3 mL Intravenous Q12H  . traZODone  50 mg Oral QHS   Continuous Infusions: . sodium chloride    . sodium chloride Stopped (06/23/17 1721)  . pantoprozole (PROTONIX) infusion 8 mg/hr (06/24/17 1611)     LOS: 2 days     Cordelia Poche, MD [facility] 06/25/2017, 8:56 AM Pager: [phone]  If 7PM-7AM, please contact night-coverage [email or web address] 06/25/2017, 8:56 AM

## 2017-06-25 NOTE — Progress Notes (Signed)
Pt was evaluated after PT called back to his room this afternoon.  Pt is reluctant but got OOB and was too weak for much mobility and just tolerated bedside movement.  Talked with wife about his poor quality of gait and transfers, and she acknowledged that he would be quite weak at home.  Pt is going to be considered for SNF to restore more independence and strength and will see acutely for the same with gait and exercises.   06/25/17 1600  PT Visit Information  Last PT Received On 06/25/17  Assistance Needed +1 (2 for longer walk)  History of Present Illness 71 yo male with onset of anemia and recent NSTEMI was admitted, has CHF and was transfused and on bedrest. Has central line and cholecystostomy tube R lateral trunk.  PMHx:  femoral bypass, severe aortic stenosis, multiple myeloma, DM, anemia, gout, OA, HTN, OSA,   Precautions  Precautions Fall (lines in chest R lateral and central anterior locations)  Restrictions  Weight Bearing Restrictions No  Home Living  Family/patient expects to be discharged to: Private residence  Living Arrangements Spouse/significant other  Available Help at Discharge Family;Available 24 hours/day  Type of Home House  Home Access Level entry  Home Layout One level  Bathroom Shower/Tub Walk-in shower  Bathroom Toilet Handicapped height  Bathroom Accessibility Yes  Home Equipment Walker - 2 wheels;Walker - 4 wheels;BSC;Shower seat;Grab bars - toilet;Grab bars - tub/shower;Hand held shower head  Prior Function  Level of Independence Needs assistance  Gait / Transfers Assistance Needed light assistance with RW  ADL's / Homemaking Assistance Needed wife cares for home  Comments used cane, RW or WC for mobility depending on energy.   Communication  Communication No difficulties  Pain Assessment  Pain Assessment Faces  Faces Pain Scale 4  Pain Location R trunk  Cognition  Arousal/Alertness Awake/alert  Behavior During Therapy Impulsive  Overall Cognitive  Status Difficult to assess  General Comments pt is demosntrating some confusion about past events   Upper Extremity Assessment  Upper Extremity Assessment Generalized weakness  Lower Extremity Assessment  Lower Extremity Assessment Generalized weakness  Cervical / Trunk Assessment  Cervical / Trunk Assessment Kyphotic  Bed Mobility  Overal bed mobility Needs Assistance  Bed Mobility Supine to Sit;Sit to Supine  Supine to sit Mod assist  Sit to supine Mod assist  General bed mobility comments assisted with bed pad and trunk to support into and out of bed  Transfers  Overall transfer level Needs assistance  Equipment used Rolling walker (2 wheeled);1 person hand held assist  Transfers Sit to/from Stand  Sit to Stand Mod assist;From elevated surface  General transfer comment pt tries to pull up on walker consistently  Ambulation/Gait  Ambulation/Gait assistance Min assist  Ambulation Distance (Feet) 8 Feet  Assistive device Rolling walker (2 wheeled);1 person hand held assist  Gait Pattern/deviations Step-to pattern;Decreased stride length;Wide base of support;Trunk flexed  General Gait Details sidesteps bedside due to fatigue and LE weakness, sits with poor control  Gait velocity reduced  Gait velocity interpretation <1.8 ft/sec, indicate of risk for recurrent falls  Balance  Overall balance assessment Needs assistance  Sitting-balance support Bilateral upper extremity supported;Feet supported  Sitting balance-Leahy Scale Fair  Postural control Right lateral lean  Standing balance support Bilateral upper extremity supported;During functional activity  Standing balance-Leahy Scale Poor  Written Expression  Dominant Hand Right    , PT MS Acute Rehab Dept. Number: [phone] and [phone]  

## 2017-06-25 NOTE — Progress Notes (Signed)
PT Cancellation Note  Patient Details Name: [name] MRN: [id number] DOB: [birth year]   Cancelled Treatment:    Reason Eval/Treat Not Completed: Other (comment).  Pt is refusing therapy as he was up earlier and very tired but wife reported he was supervision level for mobility of transfers only.  Will try later if time allows.   [name] 06/25/2017, 2:14 PM   Mee Hives, PT MS Acute Rehab Dept. Number: [place] and [place]

## 2017-06-25 NOTE — Progress Notes (Signed)
Daily Progress Note   Patient Name: [name]       Date: 06/25/2017 DOB: 1945-09-09  Age: 72 y.o. MRN#: [id number] Attending Physician: Mariel Aloe, MD Primary Care Physician: [facility] Admit Date: 06/23/2017  Reason for Consultation/Follow-up: Establishing goals of care  Subjective: Tells me he feels better than yesterday, tells me he slept great last night and pain is controlled  Length of Stay: 2  Current Medications: Scheduled Meds:  . carbidopa-levodopa  1 tablet Oral TID  . finasteride  5 mg Oral Daily  . gabapentin  400 mg Oral BID   And  . gabapentin  800 mg Oral BID  . insulin aspart  0-5 Units Subcutaneous QHS  . insulin aspart  0-9 Units Subcutaneous TID WC  . [START ON 06/26/2017] pantoprazole  40 mg Intravenous Q12H  . sodium chloride flush  3 mL Intravenous Q12H  . sodium chloride flush  3 mL Intravenous Q12H  . traZODone  50 mg Oral QHS    Continuous Infusions: . sodium chloride    . sodium chloride Stopped (06/23/17 1721)  . pantoprozole (PROTONIX) infusion 8 mg/hr (06/24/17 1611)    PRN Meds: sodium chloride, acetaminophen **OR** acetaminophen, morphine, ondansetron, polyvinyl alcohol, sodium chloride flush, sodium chloride flush, traMADol  Physical Exam  Constitutional: He is oriented to person, place, and time. He appears lethargic. He appears cachectic. He has a sickly appearance. No distress.  HENT:  Head: Normocephalic and atraumatic.  Cardiovascular: An irregular rhythm present.  Pulmonary/Chest: Effort normal. No accessory muscle usage. No tachypnea. No respiratory distress.  Abdominal: Soft. There is no tenderness.  Musculoskeletal:       Right lower leg: He exhibits edema.       Left lower leg: He exhibits edema.  Neurological: He is  oriented to person, place, and time. He appears lethargic.  Skin: Skin is warm and dry. He is not diaphoretic.  Psychiatric: He is slowed.            Vital Signs: BP 111/68   Pulse 77   Temp 97.9 F (36.6 C) (Oral)   Resp 14   Ht 5' 9"  (8.338 m)   Wt 76.9 kg (169 lb 8 oz)   SpO2 95%   BMI 25.03 kg/m  SpO2: SpO2: 95 % O2 Device: O2 Device: Room Air O2 Flow Rate: O2 Flow Rate (L/min): 0 L/min  Intake/output summary:   Intake/Output Summary (Last 24 hours) at 06/25/2017 2505 Last data filed at 06/25/2017 0846 Gross per 24 hour  Intake 671 ml  Output 1000 ml  Net -329 ml   LBM: Last BM Date: 06/23/17 Baseline Weight: Weight: 77.1 kg (170 lb) Most recent weight: Weight: 76.9 kg (169 lb 8 oz)       Palliative Assessment/Data: PPS 40%    Flowsheet Rows     Most Recent Value  Intake Tab  Referral Department  Hospitalist  Unit at Time of Referral  Med/Surg Unit  Palliative Care Primary Diagnosis  Cancer  Date Notified  06/23/17  Palliative Care Type  New Palliative care  Reason for referral  Clarify Goals of Care  Date of Admission  06/23/17  Date first seen by Palliative  Care  06/24/17  # of days Palliative referral response time  1 Day(s)  # of days IP prior to Palliative referral  0  Clinical Assessment  Palliative Performance Scale Score  40%  Pain Max last 24 hours  Not able to report  Pain Min Last 24 hours  Not able to report  Dyspnea Max Last 24 Hours  Not able to report  Dyspnea Min Last 24 hours  Not able to report  Nausea Max Last 24 Hours  Not able to report  Nausea Min Last 24 Hours  Not able to report  Anxiety Max Last 24 Hours  Not able to report  Anxiety Min Last 24 Hours  Not able to report  Other Max Last 24 Hours  Not able to report  Psychosocial & Spiritual Assessment  Palliative Care Outcomes  Patient/Family meeting held?  Yes  Who was at the meeting?  pt, wife and pt's sister  Palliative Care follow-up planned  Yes, Facility      Patient  Active Problem List   Diagnosis Date Noted  . Protein-calorie malnutrition, severe 06/25/2017  . Goals of care, counseling/discussion   . Palliative care by specialist   . Demand ischemia ([place]) 06/23/2017  . Hyponatremia 06/23/2017  . Acute respiratory distress 11/19/2016  . Atrial flutter ([place]) 11/19/2016  . Anemia due to chronic blood loss   . Pulmonary edema 10/02/2016  . Dehydration   . Shortness of breath 09/24/2016  . Dyspnea 08/20/2016  . Pancytopenia ([place]) 08/20/2016  . Type II diabetes mellitus with complication ([place]) 81/44/8185  . Hypoxia 08/20/2016  . CHF (congestive heart failure) ([place]) 08/20/2016  . Sepsis, unspecified organism ([place]) 03/23/2016  . Acute on chronic diastolic congestive heart failure ([place]) 03/23/2016  . Influenza A 03/23/2016  . Acute respiratory failure ([place]) 03/23/2016  . 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 Rectal bleeding   . Internal bleeding hemorrhoids   . Symptomatic anemia 07/16/2015  . GI bleed 07/16/2015  . Hemorrhoids 07/16/2015  . Multiple myeloma not having achieved remission ([place])   . Syncope and collapse   . Acute blood loss anemia 03/17/2015  . Bruit 01/19/2015  . S/P aortic valve replacement with bioprosthetic valve 12/06/2011  . Aortic stenosis 11/30/2011  . Chronic daily headache 11/30/2011  . Iron deficiency anemia, unspecified 09/15/2011  . IBS (irritable bowel syndrome) 09/15/2011  . Syncope 08/29/2011  . Cough syncope 10/13/2010  . HYPERLIPIDEMIA-MIXED 07/02/2008  . Essential hypertension 07/02/2008  . GERD 07/02/2008  . OBSTRUCTIVE SLEEP APNEA 01/15/2007  . ALLERGIC  RHINITIS 01/15/2007  . INSOMNIA 01/15/2007  . OSTEOARTHRITIS 12/02/2006  . BPH (benign prostatic hyperplasia) 12/02/2006  . H/O aortic valve replacement 12/02/2006    Palliative Care Assessment & Plan   HPI:  72 y.o. male  with past medical history of AVR 2006/redo 2013, BPH, asthma, GERD, diastolic heart failure,  hyperlipidemia, irritable bowel syndrome, hypertension, obstructive sleep apnea, T2DM, chronic cholecystitis status post choleocystomy tube, multifactorial anemia secondary to hemolysis from mechanical heart valve, multiple myeloma, and chronic GI bleed admitted on 06/23/2017 with nausea and weakness. Found to be hypotensive and worsening anemia.  Hemoglobin on admission was 5.7 - has received 4 units since admission.  Patient is being transfused approximately 4 units a week.  He has been transfused twice weekly now for almost a year. He is followed at [facility] by Dr. Norma Fredrickson for his multiple myeloma.  [facility] [place] [facility] .   Consult ordered  for goals of care.  Assessment: Met at the bedside with patient and his wife, [name] to follow-up on [place] conversation yesterday with Romona Curls, NP. The patient is more interactive than yesterday but still drifts off during conversation.   We discussed patient's baseline status - he is able to ambulate short distances. Is often fatigued. Takes naps most of the day and experiences restless sleep through the night. His appetite waxes and wanes.   They tell me they have no children. Have an 57 yr old cat the patient is attached to. He spends most of his time playing with the cat or watching TV when he is not sleeping.   She tells me the [place] meeting yesterday was informative and brought up some topics she had not considered - specifically code status. Discussed this with patient and wife and he tells me he would not want to be intubated, however he would want other resuscitation attempts. Attempted to discuss that intubation is usually part of resuscitation attempt and usually performed; however, patient is unable to continue conversation and again states he does not want to be on a ventilator. Will change to partial code.   Discussed with wife patient's poor prognosis - she brings this up and tells me she understands he is a  very sick man. She wants to continue current treatment d/t their short term goals - niece graduation in June and [place] convention in July.   I recommended that she continue these discussions outpatient with patient's doctors and explained they may benefit from outpatient palliative care. Discussed asking patient's PCP for a referral.   Recommendations/Plan:  Continue current treatment plan - their goals are in line with aggressive treatment  Code status changed to limited - no intubation, this needs continued discussion as patient could not grasp that intubation is usually a part of resuscitation efforts  Encouraged wife to request outpatient palliative care referral from PCP - please encourage this upon discharge as patient and wife may benefit from palliative care for ongoing goals of care discussions and symptom management  PMT will follow   Goals of Care and Additional Recommendations:  Limitations on Scope of Treatment: Full Scope Treatment  Code Status:  Limited code - no intubation  Prognosis:   Unable to determine  Discharge Planning:  Home with Palliative Services  Care plan was discussed with wife  Thank you for allowing the Palliative Medicine Team to assist in the care of this patient.   Time In: 0830 Time Out: 0945 Total Time 75 minutes Prolonged Time Billed  yes       Greater than 50%  of this time was spent counseling and coordinating care related to the above assessment and plan.  Juel Burrow, DNP, AGNP-C Palliative Medicine Team Team Phone # [phone]

## 2017-06-25 NOTE — Progress Notes (Addendum)
Patients wife reports tape of biliary drain got stuck to the gown and pulled tube out slightly, she pushed it back in.  Assessment: redness around insertion site, sutures still intact, otherwise no change since yesterdays dressing change.   Provider notified.

## 2017-06-26 DIAGNOSIS — K922 Gastrointestinal hemorrhage, unspecified: Secondary | ICD-10-CM

## 2017-06-26 DIAGNOSIS — E118 Type 2 diabetes mellitus with unspecified complications: Secondary | ICD-10-CM

## 2017-06-26 DIAGNOSIS — I350 Nonrheumatic aortic (valve) stenosis: Secondary | ICD-10-CM

## 2017-06-26 DIAGNOSIS — D649 Anemia, unspecified: Secondary | ICD-10-CM

## 2017-06-26 DIAGNOSIS — I248 Other forms of acute ischemic heart disease: Secondary | ICD-10-CM

## 2017-06-26 DIAGNOSIS — C9000 Multiple myeloma not having achieved remission: Secondary | ICD-10-CM

## 2017-06-26 DIAGNOSIS — E871 Hypo-osmolality and hyponatremia: Secondary | ICD-10-CM

## 2017-06-26 DIAGNOSIS — D696 Thrombocytopenia, unspecified: Secondary | ICD-10-CM

## 2017-06-26 LAB — GLUCOSE, CAPILLARY
GLUCOSE-CAPILLARY: 140 mg/dL — AB (ref 65–99)
Glucose-Capillary: 63 mg/dL — ABNORMAL LOW (ref 65–99)
Glucose-Capillary: 129 mg/dL — ABNORMAL HIGH (ref 65–99)
Glucose-Capillary: 169 mg/dL — ABNORMAL HIGH (ref 65–99)
Glucose-Capillary: 148 mg/dL — ABNORMAL HIGH (ref 65–99)

## 2017-06-26 MED ORDER — PANTOPRAZOLE SODIUM 40 MG PO TBEC
40.0000 mg | DELAYED_RELEASE_TABLET | Freq: Two times a day (BID) | ORAL | Status: DC
  Administered 2017-06-26 – 2017-06-29 (×6): 40 mg via ORAL
  Filled 2017-06-26 (×6): qty 1

## 2017-06-26 MED ORDER — SENNOSIDES-DOCUSATE SODIUM 8.6-50 MG PO TABS
2.0000 | ORAL_TABLET | Freq: Every day | ORAL | Status: DC
  Administered 2017-06-26 – 2017-06-27 (×2): 2 via ORAL
  Filled 2017-06-26 (×3): qty 2

## 2017-06-26 NOTE — Consult Note (Signed)
Physical Medicine and Rehabilitation Consult Reason for Consult: Decreased functional mobility Referring Physician: [facility]   HPI: [name] is a 72 y.o. 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  Patient lives with his wife in [place].  Very sedentary prior to admission.  He could get up and furniture walk from one room to the next, but needed frequent rest breaks.  Presented 06/23/2017 with severe anemia of 5.7 as well as hypotensive and fatigue.  Recent admit 06/16/2017 to 06/21/2017 for shortness of breath and anemia as well as chronic diastolic congestive heart failure receiving IV Lasix and discharged home on Lasix 60 mg twice daily.  Patient received 2 units of packed red blood cells 5/17 and another 2 units on 06/24/2017.  Latest hemoglobin 9.0.  Palliative care follow-up for goals of care.  Cardiology services consulted for mild elevation of troponin felt to be related to demand ischemia.  Persistent elevated liver function studies related to chronic cholecystitis.  Physical therapy evaluation has been completed.  MD has requested physical medicine rehab consult.   Review of Systems  Constitutional: Positive for malaise/fatigue.  HENT: Negative for hearing loss.   Eyes: Negative for blurred vision and double vision.  Respiratory: Positive for shortness of breath.   Cardiovascular: Negative for chest pain and palpitations.  Gastrointestinal: Positive for constipation. Negative for nausea and vomiting.       GERD  Genitourinary: Negative for flank pain and hematuria.  Skin: Negative for rash.  Neurological: Positive for weakness and headaches.  Psychiatric/Behavioral: Positive for  depression.  All other systems reviewed and are negative.  Past Medical History:  Diagnosis Date  . Allergic rhinitis   . Anemia 2009  . Aortic stenosis    a. s/p porcine AVR 2006;  b. s/p redo tissue AVR 12/2011 (Dr. Roxy Manns) - preAVR LHC with no CAD  . Asthma   . BPH (benign prostatic hypertrophy)   . Cancer ([place])    multiple myeloma  . Cataract   . Chronic cough   . Chronic interstitial cystitis   . Depression    pt denies  . Diabetes mellitus    type 2  . Diabetes mellitus without complication ([place])   . Diverticulosis of colon    on colonoscopy 2008  . GERD (gastroesophageal reflux disease)    bravo pH study 2008  . Gout   . H/O aortic valve replacement with porcine valve    2006, 2013  . Headache(784.0)   . Hemorrhoids    external and internal  . Hiatal hernia   . HTN (hypertension)   . Hx of echocardiogram    a. Echo  (post AVR) 12/2011:  mod LVH, EF 60-65%, Gr 2 diast dysfn, mild AI, AVR ok (mean gradient 19 mmHg), MAC, mild BAE  . Hyperlipidemia   . Hypertension   . Hyperthyroidism   . IBS (irritable bowel syndrome)   . Insomnia   . Lower GI bleed 07/16/2015  . Multiple myeloma ([place])   . OA (osteoarthritis)   . OSA (obstructive sleep apnea)    USES CPAP AS NEEDED  . 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 Periodontitis    chronic with bone loss  . Restless leg syndrome   .  S/P aortic valve replacement with bioprosthetic valve 12/06/2011   Redo AVR using 23 mm [facility] Ease pericardial tissue valve   Past Surgical History:  Procedure Laterality Date  . AORTA - FEMORAL ARTERY BYPASS GRAFT    . AORTIC VALVE REPLACEMENT  10/13/2004   31m [place] pericardial tissue valve  . AORTIC VALVE REPLACEMENT  12/06/2011   Procedure: REDO AORTIC VALVE REPLACEMENT (AVR);  Surgeon: CRexene Alberts MD;  [place]  Service: Open Heart Surgery;  Laterality: N/A;  . BUNIONECTOMY     right  . CARDIAC SURGERY     aorta vavle  replacement  . CATARACT EXTRACTION  2009    &   2012   BILATERAL  . COLONOSCOPY  08/31/2011   Procedure: COLONOSCOPY;  Surgeon: RInda Castle MD;  [place] [facility]  Service: Endoscopy;  Laterality: N/A;  . ESOPHAGOGASTRODUODENOSCOPY  08/30/2011   Procedure: ESOPHAGOGASTRODUODENOSCOPY (EGD);  Surgeon: RInda Castle MD;  [place] [facility]  Service: Endoscopy;  Laterality: N/A;  Rm 3005   . HERNIA REPAIR    . LEFT HEART CATHETERIZATION WITH CORONARY ANGIOGRAM N/A 11/30/2011   Procedure: LEFT HEART CATHETERIZATION WITH CORONARY ANGIOGRAM;  Surgeon: CBurnell Blanks MD;  [facility];  Service: Cardiovascular;  Laterality: N/A;  . NASAL SEPTOPLASTY W/ TURBINOPLASTY    . REFRACTIVE SURGERY     bilateral  . RIGHT HEART CATHETERIZATION Bilateral 11/30/2011   Procedure: RIGHT HEART CATH;  Surgeon: CBurnell Blanks MD;  [facility];  Service: Cardiovascular;  Laterality: Bilateral;  . right knee arthroscopy    . TEE WITHOUT CARDIOVERSION N/A 10/27/2015   Procedure: TRANSESOPHAGEAL ECHOCARDIOGRAM (TEE);  Surgeon: BLelon Perla MD;  [facility];  Service: Cardiovascular;  Laterality: N/A;   Family History  Problem Relation Age of Onset  . Heart disease Father   . Osteoarthritis Mother   . Hypertension Sister   . Hyperlipidemia Unknown   . Cancer Maternal Aunt    Social History:  reports that he quit smoking about 45 years ago. He has never used smokeless tobacco. He reports that he does not drink alcohol or use drugs. Allergies:  Allergies  Allergen Reactions  . Avodart [Dutasteride] Other (See Comments)    Lose of use of arms and legs   . Spironolactone Other (See Comments)    Low tolerance   . Codeine Other (See Comments)    GI upset in large doses  . Doxazosin Other (See Comments)    Dizziness   . Feraheme [Ferumoxytol] Swelling    Pedal edema   Medications Prior to Admission  Medication Sig Dispense Refill  .  acetaminophen (TYLENOL) 325 MG tablet Take 650 mg by mouth every 6 (six) hours as needed for fever.     . carbidopa-levodopa (SINEMET IR) 25-100 MG tablet Take 1 tablet by mouth 3 (three) times daily.     . cyanocobalamin 500 MCG tablet Take 500 mcg by mouth 2 (two) times daily. Lunch/dinner    . docusate sodium (COLACE) 100 MG capsule Take 100 mg by mouth 2 (two) times daily as needed for moderate constipation.     . finasteride (PROSCAR) 5 MG tablet Take 5 mg by mouth daily.    . fish oil-omega-3 fatty acids 1000 MG capsule Take 1 g by mouth daily with lunch.     . folic acid (FOLVITE) 1 MG tablet Take 1 mg by mouth daily.    . furosemide (LASIX) 40 MG tablet TAKE  2 TABLETS (80 MG TOTAL) BY MOUTH DAILY. TAKE A EXTRA TABLET ON DAYS OF DIALYSIS (Patient taking differently: Take 60 mg by mouth 2 (two) times daily. ) 100 tablet 2  . gabapentin (NEURONTIN) 800 MG tablet Take 400-800 mg by mouth See admin instructions. Take 1/2 tablet every morning and at bedtime then take 1 tablet at lunch and dinner    . ixazomib citrate (NINLARO) 3 MG capsule Take 3 mg by mouth once a week. Off on days 1, 8 and 15 of a 28 day cycle. Take on an empty stomach 1hr before or 2hrs after food. Do not crush, chew, or open.    . metFORMIN (GLUCOPHAGE) 500 MG tablet Take 500 mg by mouth 2 (two) times daily with a meal.    . morphine (MSIR) 15 MG tablet Take 7.5 mg by mouth every 4 (four) hours as needed for moderate pain.     Marland Kitchen ondansetron (ZOFRAN) 4 MG tablet Take 4 mg by mouth every 8 (eight) hours as needed for nausea or vomiting.    . pantoprazole (PROTONIX) 40 MG tablet Take 1 tablet (40 mg total) by mouth daily. For GERD (Patient taking differently: Take 40 mg by mouth daily with lunch. For GERD) 30 tablet 3  . Polyvinyl Alcohol-Povidone PF (REFRESH) 1.4-0.6 % SOLN Place 1 drop into both eyes 2 (two) times daily as needed (dry eyes).    . simvastatin (ZOCOR) 10 MG tablet Take 10 mg by mouth at bedtime.     . traMADol  (ULTRAM) 50 MG tablet Take 50 mg by mouth every 6 (six) hours as needed for moderate pain.    . traZODone (DESYREL) 50 MG tablet Take 50 mg by mouth at bedtime.    Marland Kitchen UNABLE TO FIND Place 1 application into the right eye daily as needed (eye care). Ocular Ointment     . zinc oxide 20 % ointment Apply 1 application topically 2 (two) times daily as needed for irritation.      Home: Home Living Family/patient expects to be discharged to:: Private residence Living Arrangements: Spouse/significant other Available Help at Discharge: Family, Available 24 hours/day Type of Home: House Home Access: Level entry [place]: One level Bathroom Shower/Tub: Multimedia programmer: Handicapped height Bathroom Accessibility: Yes Home Equipment: Environmental consultant - 2 wheels, Walker - 4 wheels, Bedside commode, Shower seat, Grab bars - toilet, Grab bars - tub/shower, Hand held shower head  Functional History: Prior Function Level of Independence: Needs assistance Gait / Transfers Assistance Needed: light assistance with RW ADL's / Homemaking Assistance Needed: wife cares for home Comments: used cane, RW or WC for mobility depending on energy.  Functional Status:  Mobility: Bed Mobility Overal bed mobility: Needs Assistance Bed Mobility: Supine to Sit, Sit to Supine Supine to sit: Mod assist Sit to supine: Mod assist General bed mobility comments: assisted with bed pad and trunk to support into and out of bed Transfers Overall transfer level: Needs assistance Equipment used: Rolling walker (2 wheeled), 1 person hand held assist Transfers: Sit to/from Stand Sit to Stand: Mod assist, From elevated surface General transfer comment: pt tries to pull up on walker consistently Ambulation/Gait Ambulation/Gait assistance: Min assist Ambulation Distance (Feet): 8 Feet Assistive device: Rolling walker (2 wheeled), 1 person hand held assist Gait Pattern/deviations: Step-to pattern, Decreased stride length,  Wide base of support, Trunk flexed General Gait Details: sidesteps bedside due to fatigue and LE weakness, sits with poor control Gait velocity: reduced Gait velocity interpretation: <1.8 ft/sec, indicate  of risk for recurrent falls    ADL:    Cognition: Cognition Overall Cognitive Status: Difficult to assess Orientation Level: Oriented X4 Cognition Arousal/Alertness: Awake/alert Behavior During Therapy: Impulsive Overall Cognitive Status: Difficult to assess General Comments: pt is demosntrating some confusion about past events   Blood pressure (!) 86/62, pulse 74, temperature 97.6 F (36.4 C), temperature source Oral, resp. rate 15, height 5' 9"  (1.753 m), weight 76.9 kg (169 lb 8 oz), SpO2 99 %. Physical Exam  Vitals reviewed. Constitutional: He appears well-developed and well-nourished. He appears distressed.  72 year old right-handed male  HENT:  Head: Normocephalic and atraumatic.  Eyes: EOM are normal. Right eye exhibits no discharge. Left eye exhibits no discharge.  Neck: Normal range of motion. Neck supple. No thyromegaly present.  Cardiovascular: Normal rate and regular rhythm.  Respiratory: Effort normal and breath sounds normal. No respiratory distress.  GI: Soft. Bowel sounds are normal. He exhibits distension.  Musculoskeletal:  No edema or tenderness in extremities  Neurological: He is alert.  Follows simple commands. Motor: 4 -/5 throughout  Skin: Skin is warm and dry. There is pallor.  Psychiatric: He has a normal mood and affect. His behavior is normal.    Results for orders placed or performed during the hospital encounter of 06/23/17 (from the past 24 hour(s))  CBC     Status: Abnormal   Collection Time: 06/25/17  3:48 PM  Result Value Ref Range   WBC 8.0 4.0 - 10.5 K/uL   RBC 3.01 (L) 4.22 - 5.81 MIL/uL   Hemoglobin 9.0 (L) 13.0 - 17.0 g/dL   HCT 28.6 (L) 39.0 - 52.0 %   MCV 95.0 78.0 - 100.0 fL   MCH 29.9 26.0 - 34.0 pg   MCHC 31.5 30.0 - 36.0  g/dL   RDW 22.2 (H) 11.5 - 15.5 %   Platelets 110 (L) 150 - 400 K/uL  Glucose, capillary     Status: Abnormal   Collection Time: 06/25/17  5:27 PM  Result Value Ref Range   Glucose-Capillary 148 (H) 65 - 99 mg/dL  Glucose, capillary     Status: Abnormal   Collection Time: 06/25/17 10:07 PM  Result Value Ref Range   Glucose-Capillary 147 (H) 65 - 99 mg/dL  Glucose, capillary     Status: Abnormal   Collection Time: 06/26/17  8:55 AM  Result Value Ref Range   Glucose-Capillary 63 (L) 65 - 99 mg/dL  Glucose, capillary     Status: Abnormal   Collection Time: 06/26/17  9:34 AM  Result Value Ref Range   Glucose-Capillary 129 (H) 65 - 99 mg/dL  Glucose, capillary     Status: Abnormal   Collection Time: 06/26/17 12:18 PM  Result Value Ref Range   Glucose-Capillary 140 (H) 65 - 99 mg/dL   No results found.  Assessment/Plan: Diagnosis: debility Labs independently reviewed.  Records reviewed and summated above.  1. Does the need for close, 24 hr/day medical supervision in concert with the patient's rehab needs make it unreasonable for this patient to be served in a less intensive setting? Potentially  2. 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 Due to safety, skin/wound care, disease management and patient education, does the patient require 24 hr/day rehab nursing? Yes 4. Does the patient require coordinated care of a physician, rehab nurse, PT (1-2 hrs/day,  5 days/week) and OT (1-2 hrs/day, 5 days/week) to address physical and functional deficits in the context of the above medical diagnosis(es)? Yes Addressing deficits in the following areas: balance, endurance, locomotion, strength, transferring, bathing, dressing, toileting and psychosocial support 5. Can the patient actively participate in an intensive therapy program of at least 3 hrs of therapy per day at least 5 days per week? Not at present 6. The potential for patient to make measurable gains while on inpatient rehab is good 7. Anticipated functional outcomes upon discharge from inpatient rehab are supervision  with PT, supervision with OT, n/a with SLP. 8. Estimated rehab length of stay to reach the above functional goals is: 8-12 days. 9. Anticipated D/C setting: Home 10. Anticipated post D/C treatments: HH therapy and Home excercise program 11. Overall Rehab/Functional Prognosis: good and fair  RECOMMENDATIONS: This patient's condition is appropriate for continued rehabilitative care in the following setting: Patient sedentary at baseline and continues to have limitations secondary to endurance and fatigue. Do not feel patient will be able to tolerate CIR. Recommend SNF for a less intensive rehabilitation if patient does not feel he is at or near his baseline. Patient has agreed to participate in recommended program. Potentially Note that insurance prior authorization may be required for reimbursement for recommended care.  Comment: Rehab Admissions Coordinator to follow up.   I have personally performed a face to face diagnostic evaluation, including, but not limited to relevant history and physical exam findings, of this patient and developed relevant assessment and plan.  Additionally, I have reviewed and concur with the physician assistant's documentation above.   Delice Lesch, MD, ABPMR Lavon Paganini Angiulli, PA-C 06/26/2017

## 2017-06-26 NOTE — Progress Notes (Signed)
Daily Progress Note   Patient Name: [name]       Date: 06/26/2017 DOB: 23-Aug-1945  Age: 72 y.o. MRN#: [id number] Attending Physician: Mariel Aloe, MD Primary Care Physician: [facility] Admit Date: 06/23/2017  Reason for Consultation/Follow-up: Establishing goals of care  Subjective: tells me he slept great last night and pain is controlled  Length of Stay: 3  Current Medications: Scheduled Meds:  . carbidopa-levodopa  1 tablet Oral TID  . finasteride  5 mg Oral Daily  . gabapentin  400 mg Oral BID   And  . gabapentin  800 mg Oral BID  . insulin aspart  0-5 Units Subcutaneous QHS  . insulin aspart  0-9 Units Subcutaneous TID WC  . pantoprazole  40 mg Oral Q12H  . senna-docusate  2 tablet Oral QHS  . sodium chloride flush  3 mL Intravenous Q12H  . traZODone  50 mg Oral QHS    Continuous Infusions: . sodium chloride      PRN Meds: sodium chloride, acetaminophen **OR** acetaminophen, morphine, ondansetron, polyvinyl alcohol, sodium chloride flush, sodium chloride flush, traMADol  Physical Exam  Constitutional: He is oriented to person, place, and time. He appears lethargic. He appears cachectic. He has a sickly appearance.  HENT:  Head: Normocephalic and atraumatic.  Cardiovascular: An irregular rhythm present.  Pulmonary/Chest: Effort normal. No accessory muscle usage. No tachypnea. No respiratory distress.  Abdominal: Soft.  Musculoskeletal:       Right lower leg: He exhibits edema.       Left lower leg: He exhibits edema.  Neurological: He is oriented to person, place, and time. He appears lethargic.  Skin: Skin is warm and dry.  Psychiatric: He is slowed.            Vital Signs: BP (!) 86/62 (BP Location: Left Arm)   Pulse 74   Temp 97.6 F (36.4 C)  (Oral)   Resp 15   Ht 5' 9"  (1.753 m)   Wt 76.9 kg (169 lb 8 oz)   SpO2 99%   BMI 25.03 kg/m  SpO2: SpO2: 99 % O2 Device: O2 Device: Room Air O2 Flow Rate: O2 Flow Rate (L/min): 0 L/min  Intake/output summary:   Intake/Output Summary (Last 24 hours) at 06/26/2017 1117 Last data filed at 06/26/2017 3832 Gross per 24 hour  Intake 3 ml  Output 850 ml  Net -847 ml   LBM: Last BM Date: 06/23/17 Baseline Weight: Weight: 77.1 kg (170 lb) Most recent weight: Weight: 76.9 kg (169 lb 8 oz)       Palliative Assessment/Data: PPS 40%    Flowsheet Rows     Most Recent Value  Intake Tab  Referral Department  Hospitalist  Unit at Time of Referral  Med/Surg Unit  Palliative Care Primary Diagnosis  Cancer  Date Notified  06/23/17  Palliative Care Type  New Palliative care  Reason for referral  Clarify Goals of Care  Date of Admission  06/23/17  Date first seen by Palliative Care  06/24/17  # of days Palliative referral response time  1 Day(s)  # of days IP prior to Palliative referral  0  Clinical Assessment  Palliative Performance Scale Score  40%  Pain Max last 24 hours  Not able to report  Pain Min Last 24 hours  Not able to report  Dyspnea Max Last 24 Hours  Not able to report  Dyspnea Min Last 24 hours  Not able to report  Nausea Max Last 24 Hours  Not able to report  Nausea Min Last 24 Hours  Not able to report  Anxiety Max Last 24 Hours  Not able to report  Anxiety Min Last 24 Hours  Not able to report  Other Max Last 24 Hours  Not able to report  Psychosocial & Spiritual Assessment  Palliative Care Outcomes  Patient/Family meeting held?  Yes  Who was at the meeting?  pt, wife and pt's sister  Palliative Care Outcomes  Improved pain interventions, Improved non-pain symptom therapy, Clarified goals of care, Provided psychosocial or spiritual support, Changed CPR status, ACP counseling assistance, Linked to palliative care logitudinal support  Patient/Family wishes:  Interventions discontinued/not started   Mechanical Ventilation  Palliative Care follow-up planned  Yes, Home  Palliative Care Follow-up Reason  Clarify goals of care, Non-pain symptom, Pain, Advanced care planning      Patient Active Problem List   Diagnosis Date Noted  . Protein-calorie malnutrition, severe 06/25/2017  . Goals of care, counseling/discussion   . Palliative care by specialist   . Demand ischemia ([place]) 06/23/2017  . Hyponatremia 06/23/2017  . Acute respiratory distress 11/19/2016  . Atrial flutter ([place]) 11/19/2016  . Anemia due to chronic blood loss   . Pulmonary edema 10/02/2016  . Dehydration   . Shortness of breath 09/24/2016  . Dyspnea 08/20/2016  . Pancytopenia ([place]) 08/20/2016  . Type II diabetes mellitus with complication ([place]) 71/69/6789  . Hypoxia 08/20/2016  . CHF (congestive heart failure) ([place]) 08/20/2016  . Sepsis, unspecified organism ([place]) 03/23/2016  . Acute on chronic diastolic congestive heart failure ([place]) 03/23/2016  . Influenza A 03/23/2016  . Acute respiratory failure ([place]) 03/23/2016  . 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 Rectal bleeding   . Internal bleeding hemorrhoids   . Symptomatic anemia 07/16/2015  . GI bleed 07/16/2015  . Hemorrhoids 07/16/2015  . Multiple myeloma not having achieved remission ([place])   . Syncope and collapse   . Acute blood loss anemia 03/17/2015  . Bruit 01/19/2015  . S/P aortic valve replacement with bioprosthetic valve 12/06/2011  . Aortic stenosis 11/30/2011  . Chronic daily headache 11/30/2011  . Iron deficiency anemia, unspecified 09/15/2011  . IBS (irritable bowel syndrome) 09/15/2011  . Syncope 08/29/2011  . Cough syncope 10/13/2010  . HYPERLIPIDEMIA-MIXED 07/02/2008  . Essential hypertension 07/02/2008  . GERD 07/02/2008  . OBSTRUCTIVE SLEEP APNEA 01/15/2007  . ALLERGIC  RHINITIS 01/15/2007  . INSOMNIA 01/15/2007  . OSTEOARTHRITIS 12/02/2006  . BPH (benign prostatic  hyperplasia) 12/02/2006  . 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 Found to be hypotensive and worsening anemia. Hemoglobin on admission was 5.7 - has received 4 units since admission. Patient is being transfused approximately 4 units a week. He has been transfused twice weekly now for almost a year.[facility] Dr[facility] Norma Fredrickson for his multiple  myeloma. [facility] [place] [facility]   Consult ordered for goals of care.  Assessment: Follow- up with patient and wife at bedside. Symptoms controlled by current regimen. C/o of shoulder pain, tells me tramadol is helpful.   Tells me he does not want to discuss MOST form today.   We discussed change in discharge plans for SNF w/rehab.   Questions and concerns addressed.  Recommendations/Plan:  Now that patient is going to SNF - please write for outpatient palliative to see patient in discharge summary  Continue current treatment plan- their goals are in line with aggressive treatment  PMT will follow  Goals of Care and Additional Recommendations:  Limitations on Scope of Treatment: Full Scope Treatment  Code Status:  Limited code - DNI  Prognosis:   Unable to determine  Discharge Planning:  [place] for rehab with Palliative care service follow-up  Care plan was discussed with wife  Thank you for allowing the Palliative Medicine Team to assist in the care of this patient.   Time In: 1115 Time Out: 1130 Total Time 15  minutes Prolonged Time Billed  no       Greater than 50%  of this time was spent counseling and coordinating care related to the above assessment and plan.  Juel Burrow, DNP, AGNP-C Palliative Medicine Team Team Phone # [phone]

## 2017-06-26 NOTE — Progress Notes (Signed)
Hypoglycemic Event  CBG: 63  Treatment: Given 240 cc cola and graham crackers.  Symptoms: N/A  Follow-up CBG: Time: 1219 CBG Result: 129  Possible Reasons for Event: Patient did not eat breakfast yet.  Comments/MD notified: N/A    Melonie [place]

## 2017-06-26 NOTE — Progress Notes (Signed)
Pt reports that he is seeing double.  Neuro check completed, nothing noted that is outside patient's baseline.  Patient states he had been seeing double "since before you gave me my meds" but did not report this until he got up to the bedside commode right before the entry of this note.  Patient states he has been seeing double "pretty much all day."  Patient correctly stated the number of fingers held up when this nurse held up fingers in front of the patient.  Paged [facility] provider Lamar Blinks, who stated to continue to monitor the patient for now, and did not issue any additional orders.  Will continue to monitor.

## 2017-06-26 NOTE — Progress Notes (Signed)
PROGRESS NOTE    [name]  QQI:[id number] DOB: 28-Jun-1945 DOA: 06/23/2017 PCP: [facility]   Brief Narrative: [name] is a 72 y.o. 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  Patient presented secondary to hypotension found to be severely anemic. He has received 4 units of PRBC. Physical therapy recommending SNF.   Assessment & Plan:   Principal Problem:   Symptomatic anemia Active Problems:   Aortic stenosis   Multiple myeloma not having achieved remission (HCC)   GI bleed   Paravalvular leak of prosthetic heart valve   Type II diabetes mellitus with complication (HCC)   Demand ischemia (HCC)   Hyponatremia   Goals of care, counseling/discussion   Palliative care by specialist   Protein-calorie malnutrition, severe   Symptomatic anemia Multifactorial.  Status post 2 units of PRBC on 5/17 and another 2 units on 5/18. Good hemoglobin response. LDH elevated. Haptoglobin undetectable. -Palliative care recommendations: DNI, outpatient palliative care -PT eval: SNF  Aortic stenosis Chronic.  Status post valve replacement.  Elevated troponin Cardiology consulted on 5/17.  Thought secondary to demand ischemia in setting of severe illness.  Troponin elevated to over 3 today.  Discussed with cardiology via telephone and still likely related to demand ischemia in the setting of acute illness.  Recommendation is still for medical management as patient is not an Intervention candidate.  Diabetes mellitus, type 2 -Continue sliding scale insulin (qAC&HS)  Chronic cholecystitis Patient is status post cholecystostomy tube.  Patient with persistent elevated AST, ALT, alk phos, bilirubin.  Currently stable.  Multiple myeloma Patient follows with [facility] as an outpatient.  Currently not on therapy  secondary to recent anemia issues.  Hypotension Secondary to significant anemia in addition to significant aortic stenosis.  Improved. Slightly low today.  Thrombocytopenia Stable. No overt bleeding.  Severe protein calorie malnutrition Seen by dietician. Diet liberalized. Protein supplements.  Abdominal distension Mild. No tenderness. On narcotics. -Senokot-S   DVT prophylaxis: SCDs Code Status:   Code Status: Partial Code (DNI) Family Communication: Wife at bedside Disposition Plan: Discharge to SNF when bed available  Consultants:   Cardiology  Palliative care medicine  Procedures:   PRBC x2 units (5/17)  PRBC x2 units (5/18)  Antimicrobials:  None    Subjective: No issues overnight.  Objective: Vitals:   06/25/17 0600 06/25/17 1334 06/25/17 2105 06/26/17 0550  BP: 111/68 (!) 89/63 (!) 122/58 (!) 86/62  Pulse:  73 75 74  Resp: _0 Temp:  97.8 F (36.6 C) 97.9 F (36.6 C) 97.6 F (36.4 C)  TempSrc:  Oral Oral Oral  SpO2: 95% 94% 98% 99%  Weight:      Height:        Intake/Output Summary (Last 24 hours) at 06/26/2017 0757 Last data filed at 06/25/2017 2223 Gross per 24 hour  Intake -  Output 1550 ml  Net -1550 ml   Filed Weights   06/23/17 0158 06/24/17 1636  Weight: 77.1 kg (170 lb) 76.9 kg (169 lb 8 oz)    Examination:  General exam: Appears calm and comfortable Respiratory system: Clear to auscultation. Respiratory effort normal. Cardiovascular system: S1 & S2 heard, RRR. 2/6 systolic murmur. Gastrointestinal system: Abdomen is mildly distended, soft and nontender. Normal bowel sounds heard. Central nervous system: Asleep. Extremities: Trace leg edema. No calf tenderness Skin: Pale, jaundice  Data Reviewed: I have personally reviewed following labs and imaging studies  CBC: Recent Labs  Lab 06/23/17 0253  06/23/17 1905 06/24/17 0107 06/24/17 0508 06/25/17 0201 06/25/17 1548  WBC 6.7   < > 6.2 4.7 6.2 6.6 8.0    NEUTROABS 5.8  --   --   --   --   --   --   HGB 5.7*   < > 7.4* 7.0* 7.0* 8.9* 9.0*  HCT 18.8*   < > 23.4* 21.7* 22.2* 27.3* 28.6*  MCV 96.9   < > 94.0 96.0 95.3 93.2 95.0  PLT 124*   < > 121* 93* 104* 98* 110*   < > = values in this interval not displayed.   Basic Metabolic Panel: Recent Labs  Lab 06/23/17 0253 06/24/17 0508  NA 132* 134*  K 3.6 3.8  CL 97* 98*  CO2 22 26  GLUCOSE 129* 125*  BUN 24* 35*  CREATININE 0.52* 0.76  CALCIUM 9.4 9.6   GFR: Estimated Creatinine Clearance: 85.9 mL/min (by C-G formula based on SCr of 0.76 mg/dL). Liver Function Tests: Recent Labs  Lab 06/23/17 0253  AST 45*  ALT 13*  ALKPHOS 96  BILITOT 3.6*  PROT 5.3*  ALBUMIN 2.8*   No results for input(s): LIPASE, AMYLASE in the last 168 hours. No results for input(s): AMMONIA in the last 168 hours. Coagulation Profile: No results for input(s): INR, PROTIME in the last 168 hours. Cardiac Enzymes: Recent Labs  Lab 06/23/17 0253 06/23/17 0552 06/23/17 1905  TROPONINI 0.16* 0.49* 3.10*   BNP (last 3 results) No results for input(s): PROBNP in the last 8760 hours. HbA1C: No results for input(s): HGBA1C in the last 72 hours. CBG: Recent Labs  Lab 06/25/17 0024 06/25/17 0628 06/25/17 1205 06/25/17 1727 06/25/17 2207  GLUCAP 128* 105* 154* 148* 147*   Lipid Profile: No results for input(s): CHOL, HDL, LDLCALC, TRIG, CHOLHDL, LDLDIRECT in the last 72 hours. Thyroid Function Tests: No results for input(s): TSH, T4TOTAL, FREET4, T3FREE, THYROIDAB in the last 72 hours. Anemia Panel: No results for input(s): VITAMINB12, FOLATE, FERRITIN, TIBC, IRON, RETICCTPCT in the last 72 hours. Sepsis Labs: No results for input(s): PROCALCITON, LATICACIDVEN in the last 168 hours.  Recent Results (from the past 240 hour(s))  MRSA PCR Screening     Status: None   Collection Time: 06/23/17  7:20 PM  Result Value Ref Range Status   MRSA by PCR NEGATIVE NEGATIVE Final    Comment:        The  GeneXpert MRSA Assay (FDA approved for NASAL specimens only), is one component of a comprehensive MRSA colonization surveillance program. It is not intended to diagnose MRSA infection nor to guide or monitor treatment for MRSA infections. Performed at [facility], [place] [address], [place], [place] [address]          Radiology Studies: No results found.      Scheduled Meds: . carbidopa-levodopa  1 tablet Oral TID  . finasteride  5 mg Oral Daily  . gabapentin  400 mg Oral BID   And  . gabapentin  800 mg Oral BID  . insulin aspart  0-5 Units Subcutaneous QHS  . insulin aspart  0-9 Units Subcutaneous TID WC  . pantoprazole  40 mg Intravenous Q12H  . sodium chloride flush  3 mL Intravenous Q12H  . traZODone  50 mg Oral QHS   Continuous Infusions: . sodium chloride       LOS: 3 days  Cordelia Poche, MD [facility] 06/26/2017, 7:57 AM Pager: [phone]  If 7PM-7AM, please contact night-coverage [email or web address] 06/26/2017, 7:57 AM

## 2017-06-27 ENCOUNTER — Inpatient Hospital Stay (HOSPITAL_COMMUNITY): Payer: Medicare Other

## 2017-06-27 DIAGNOSIS — H532 Diplopia: Secondary | ICD-10-CM

## 2017-06-27 LAB — CBC
HCT: 26.2 % — ABNORMAL LOW (ref 39.0–52.0)
HEMOGLOBIN: 8.2 g/dL — AB (ref 13.0–17.0)
MCH: 29.3 pg (ref 26.0–34.0)
MCHC: 31.3 g/dL (ref 30.0–36.0)
MCV: 93.6 fL (ref 78.0–100.0)
Platelets: 69 10*3/uL — ABNORMAL LOW (ref 150–400)
RBC: 2.8 MIL/uL — ABNORMAL LOW (ref 4.22–5.81)
RDW: 19.9 % — AB (ref 11.5–15.5)
WBC: 5.3 10*3/uL (ref 4.0–10.5)

## 2017-06-27 LAB — GLUCOSE, CAPILLARY
GLUCOSE-CAPILLARY: 110 mg/dL — AB (ref 65–99)
GLUCOSE-CAPILLARY: 144 mg/dL — AB (ref 65–99)
GLUCOSE-CAPILLARY: 108 mg/dL — AB (ref 65–99)
GLUCOSE-CAPILLARY: 99 mg/dL (ref 65–99)

## 2017-06-27 MED ORDER — SIMETHICONE 80 MG PO CHEW
160.0000 mg | CHEWABLE_TABLET | Freq: Four times a day (QID) | ORAL | Status: DC | PRN
  Administered 2017-06-27 – 2017-06-28 (×2): 160 mg via ORAL
  Filled 2017-06-27 (×2): qty 2

## 2017-06-27 MED ORDER — POLYETHYLENE GLYCOL 3350 17 G PO PACK
17.0000 g | PACK | Freq: Every day | ORAL | Status: DC
  Administered 2017-06-27 – 2017-06-28 (×2): 17 g via ORAL
  Filled 2017-06-27: qty 1

## 2017-06-27 MED ORDER — MORPHINE SULFATE (PF) 2 MG/ML IV SOLN
2.0000 mg | Freq: Once | INTRAVENOUS | Status: AC | PRN
  Administered 2017-06-27: 2 mg via INTRAVENOUS
  Filled 2017-06-27: qty 1

## 2017-06-27 NOTE — Clinical Social Work Note (Signed)
Clinical Social Work Assessment  Patient Details  Name: [name] MRN: [id number] Date of Birth: 07-19-1945  Date of referral:  06/27/17               Reason for consult:  Facility Placement                Permission sought to share information with:  Facility Sport and exercise psychologist, Family Supports Permission granted to share information::  Yes, Verbal Permission Granted  Name::     Actuary::  SNFs  Relationship::  Spouse  Contact Information:  [phone]  Housing/Transportation Living arrangements for the past 2 months:  [place] of Information:  Patient, Spouse Patient Interpreter Needed:  None Criminal Activity/Legal Involvement Pertinent to Current Situation/Hospitalization:  No - Comment as needed Significant Relationships:  Spouse Lives with:  Spouse Do you feel safe going back to the place where you live?  Yes Need for family participation in patient care:  Yes (Comment)  Care giving concerns:  CSW received consult for possible SNF placement at time of discharge. CSW spoke with patient and spouse at bedside regarding PT recommendation of SNF placement at time of discharge. Patient's spouse reported that patient's spouse is currently unable to care for patient at their home given patient's current physical needs and fall risk. However, patient staets he wishes to return home. Patient expressed understanding of PT recommendation and is agreeable to SNF placement at time of discharge but would like to think about it. CSW to continue to follow and assist with discharge planning needs.   Social Worker assessment / plan:  CSW spoke with patient and spouse concerning possibility of rehab at [facility] before returning home.  Employment status:  Retired Forensic scientist:  Medicare PT Recommendations:  [place] / Referral to community resources:  [place]  Patient/Family's Response to care:  Patient and  spouse recognizes need for rehab before returning home but are considering their options. Potential barriers includes SNFs being unable to transport patient to out of county follow-up appointments daily as well as blood transfusions twice a week at [facility].   Patient/Family's Understanding of and Emotional Response to Diagnosis, Current Treatment, and Prognosis:  Patient/family is realistic regarding therapy needs and expressed being hopeful for return home. However, it appears patient's wife is experiencing care-giver burn out. Patient expressed understanding of CSW role and discharge process as well as medical condition. No questions/concerns about plan or treatment.    Emotional Assessment Appearance:  Appears stated age Attitude/Demeanor/Rapport:  Apprehensive Affect (typically observed):  Accepting, Appropriate Orientation:  Oriented to Self, Oriented to Place, Oriented to Situation, Oriented to  Time Alcohol / Substance use:  Not Applicable Psych involvement (Current and /or in the community):  No (Comment)  Discharge Needs  Concerns to be addressed:  Care Coordination Readmission within the last 30 days:  No Current discharge risk:  None Barriers to Discharge:  Continued Medical Work up   [employer], [place] 06/27/2017, 12:02 PM

## 2017-06-27 NOTE — Progress Notes (Signed)
PROGRESS NOTE    Danny [name]  EZM:[id number] DOB: 1945/12/13 DOA: 06/23/2017 PCP: [facility]   Brief Narrative: Danny HEAL is a 72 y.o. 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  Patient presented secondary to hypotension found to be severely anemic. [name] has received 4 units of PRBC. Physical therapy recommending SNF.   Assessment & Plan:   Principal Problem:   Symptomatic anemia Active Problems:   Aortic stenosis   Multiple myeloma not having achieved remission (HCC)   GI bleed   Paravalvular leak of prosthetic heart valve   Type II diabetes mellitus with complication (HCC)   Demand ischemia (HCC)   Hyponatremia   Goals of care, counseling/discussion   Palliative care by specialist   Protein-calorie malnutrition, severe   Thrombocytopenia ([place])   Symptomatic anemia Multifactorial.  Status post 2 units of PRBC on 5/17 and another 2 units on 5/18. Good hemoglobin response. LDH elevated. Haptoglobin undetectable. -Palliative care recommendations: DNI, outpatient palliative care -PT eval: SNF -CBC today  Aortic stenosis Chronic.  Status post valve replacement.  Elevated troponin Cardiology consulted on 5/17.  Thought secondary to demand ischemia in setting of severe illness.  Troponin elevated to over 3 today.  Discussed with cardiology via telephone and still likely related to demand ischemia in the setting of acute illness.  Recommendation is still for medical management as patient is not an Intervention candidate.  Diplopia This is a chronic issue over the last few months per the patient. Intermittent. Sometimes worse in the morning and went sitting up. No workup. [name] follows with an ophthalmologist as an outpatient. No focal neuro deficit. Low suspicion for acute infarct. ?pituitary  lesion. -MRI brain  Diabetes mellitus, type 2 -Continue sliding scale insulin (qAC&HS)  Chronic cholecystitis Patient is status post cholecystostomy tube.  Patient with persistent elevated AST, ALT, alk phos, bilirubin.  Currently stable.  Multiple myeloma Patient follows with [facility] as an outpatient.  Currently not on therapy secondary to recent anemia issues.  Hypotension Secondary to significant anemia in addition to significant aortic stenosis.  Improved. Slightly low today.  Thrombocytopenia Stable. No overt bleeding but does have frequent bruising.  Severe protein calorie malnutrition Seen by dietician. Diet liberalized. Protein supplements.  Abdominal distension Mild. No tenderness. On narcotics. No recent bowel movement -Senokot-S -Add miralax  Right arm edema Likely secondary to IV. Discussed with nurse to move IV site.   DVT prophylaxis: SCDs Code Status:   Code Status: Partial Code (DNI) Family Communication: Wife at bedside Disposition Plan: Discharge to SNF when bed available  Consultants:   Cardiology  Palliative care medicine  Procedures:   PRBC x2 units (5/17)  PRBC x2 units (5/18)  Antimicrobials:  None    Subjective: Diplopia occurring frequently.  Objective: Vitals:   06/26/17 0550 06/26/17 1340 06/26/17 2051 06/27/17 0527  BP: (!) 86/62 (!) 100/56 (!) 108/57 113/71  Pulse: 74 66 75 72  Resp:  (!) _0 Temp: 97.6 F (36.4 C) 98 F (36.7 C) (!) 97.5 F (36.4 C) 97.7 F (36.5 C)  TempSrc: Oral Oral Oral   SpO2: 99% 100% 100% 98%  Weight:      Height:        Intake/Output Summary (Last 24 hours) at 06/27/2017 0927 Last data filed at 06/27/2017 0714 Gross per 24 hour  Intake 3 ml  Output 1825  ml  Net -1822 ml   Filed Weights   06/23/17 0158 06/24/17 1636  Weight: 77.1 kg (170 lb) 76.9 kg (169 lb 8 oz)    Examination:  General exam: Appears calm and comfortable, muscle wasting. Respiratory system: Diminished  auscultation. Respiratory effort normal. Cardiovascular system: S1 & S2 heard, RRR. No murmurs, rubs, gallops or clicks. Gastrointestinal system: Abdomen is nondistended, soft and nontender. Normal bowel sounds heard. Cholecystostomy site without drainage Central nervous system: Alert and oriented. No focal neurological deficits. No lid lag. 4/5 strength.  Extremities: Right arm edema with no overlying erythema. No calf tenderness Skin: Multiple areas of ecchymosis, jaundice Psychiatry: Judgement and insight appear normal. Mood & affect appropriate.     Data Reviewed: I have personally reviewed following labs and imaging studies  CBC: Recent Labs  Lab 06/23/17 0253  06/24/17 0107 06/24/17 0508 06/25/17 0201 06/25/17 1548 06/27/17 0754  WBC 6.7   < > 4.7 6.2 6.6 8.0 5.3  NEUTROABS 5.8  --   --   --   --   --   --   HGB 5.7*   < > 7.0* 7.0* 8.9* 9.0* 8.2*  HCT 18.8*   < > 21.7* 22.2* 27.3* 28.6* 26.2*  MCV 96.9   < > 96.0 95.3 93.2 95.0 93.6  PLT 124*   < > 93* 104* 98* 110* 69*   < > = values in this interval not displayed.   Basic Metabolic Panel: Recent Labs  Lab 06/23/17 0253 06/24/17 0508  NA 132* 134*  K 3.6 3.8  CL 97* 98*  CO2 22 26  GLUCOSE 129* 125*  BUN 24* 35*  CREATININE 0.52* 0.76  CALCIUM 9.4 9.6   GFR: Estimated Creatinine Clearance: 85.9 mL/min (by C-G formula based on SCr of 0.76 mg/dL). Liver Function Tests: Recent Labs  Lab 06/23/17 0253  AST 45*  ALT 13*  ALKPHOS 96  BILITOT 3.6*  PROT 5.3*  ALBUMIN 2.8*   No results for input(s): LIPASE, AMYLASE in the last 168 hours. No results for input(s): AMMONIA in the last 168 hours. Coagulation Profile: No results for input(s): INR, PROTIME in the last 168 hours. Cardiac Enzymes: Recent Labs  Lab 06/23/17 0253 06/23/17 0552 06/23/17 1905  TROPONINI 0.16* 0.49* 3.10*   BNP (last 3 results) No results for input(s): PROBNP in the last 8760 hours. HbA1C: No results for input(s): HGBA1C in  the last 72 hours. CBG: Recent Labs  Lab 06/26/17 0934 06/26/17 1218 06/26/17 1705 06/26/17 2046 06/27/17 0806  GLUCAP 129* 140* 169* 148* 110*   Lipid Profile: No results for input(s): CHOL, HDL, LDLCALC, TRIG, CHOLHDL, LDLDIRECT in the last 72 hours. Thyroid Function Tests: No results for input(s): TSH, T4TOTAL, FREET4, T3FREE, THYROIDAB in the last 72 hours. Anemia Panel: No results for input(s): VITAMINB12, FOLATE, FERRITIN, TIBC, IRON, RETICCTPCT in the last 72 hours. Sepsis Labs: No results for input(s): PROCALCITON, LATICACIDVEN in the last 168 hours.  Recent Results (from the past 240 hour(s))  MRSA PCR Screening     Status: None   Collection Time: 06/23/17  7:20 PM  Result Value Ref Range Status   MRSA by PCR NEGATIVE NEGATIVE Final    Comment:        The GeneXpert MRSA Assay (FDA approved for NASAL specimens only), is one component of a comprehensive MRSA colonization surveillance program. It is not intended to diagnose MRSA infection nor to guide or monitor treatment for MRSA infections. Performed at [facility], [place]  [address] [place], [place] [address]          Radiology Studies: No results found.      Scheduled Meds: . carbidopa-levodopa  1 tablet Oral TID  . finasteride  5 mg Oral Daily  . gabapentin  400 mg Oral BID   And  . gabapentin  800 mg Oral BID  . insulin aspart  0-5 Units Subcutaneous QHS  . insulin aspart  0-9 Units Subcutaneous TID WC  . pantoprazole  40 mg Oral Q12H  . senna-docusate  2 tablet Oral QHS  . sodium chloride flush  3 mL Intravenous Q12H  . traZODone  50 mg Oral QHS   Continuous Infusions: . sodium chloride       LOS: 4 days     Cordelia Poche, MD [facility] 06/27/2017, 9:27 AM Pager: [phone]  If 7PM-7AM, please contact night-coverage [email or web address] 06/27/2017, 9:27 AM

## 2017-06-27 NOTE — Care Management Important Message (Signed)
Important Message  Patient Details  Name: [name] MRN: [id number] Date of Birth: 05-29-1945   Medicare Important Message Given:  Yes    Belynda Pagaduan [address] 06/27/2017, 12:51 PM

## 2017-06-27 NOTE — NC FL2 (Addendum)
[place] MEDICAID FL2 LEVEL OF CARE SCREENING TOOL     IDENTIFICATION  Patient Name: [name] Birthdate: 05/06/1945 Sex: male Admission Date (Current Location): 06/23/2017  County and Medicaid Number:  [place]   Facility and Address:  [facility] [place], [address], [place], [place] [address]      Provider Number: [id number]  Attending Physician Name and Address:  Nettey, Ralph A, MD  Relative Name and Phone Number:  Patricia, spouse, [phone]    Current Level of Care: Hospital Recommended Level of Care: Skilled Nursing Facility Prior Approval Number:    Date Approved/Denied:   PASRR Number: [id number]  Discharge Plan: SNF    Current Diagnoses: Patient Active Problem List   Diagnosis Date Noted  . Thrombocytopenia (HCC)   . Protein-calorie malnutrition, severe 06/25/2017  . Goals of care, counseling/discussion   . Palliative care by specialist   . Demand ischemia (HCC) 06/23/2017  . Hyponatremia 06/23/2017  . Acute respiratory distress 11/19/2016  . Atrial flutter (HCC) 11/19/2016  . Anemia due to chronic blood loss   . Pulmonary edema 10/02/2016  . Dehydration   . Shortness of breath 09/24/2016  . Dyspnea 08/20/2016  . Pancytopenia (HCC) 08/20/2016  . Type II diabetes mellitus with complication (HCC) 08/20/2016  . Hypoxia 08/20/2016  . CHF (congestive heart failure) (HCC) 08/20/2016  . Sepsis, unspecified organism (HCC) 03/23/2016  . Acute on chronic diastolic congestive heart failure (HCC) 03/23/2016  . Influenza A 03/23/2016  . Acute respiratory failure (HCC) 03/23/2016  . 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 Rectal bleeding   . Internal bleeding hemorrhoids   . Symptomatic anemia 07/16/2015  . GI bleed 07/16/2015  . Hemorrhoids 07/16/2015  . Multiple myeloma not having achieved remission (HCC)   . Syncope and collapse   . Acute blood loss anemia 03/17/2015  . Bruit 01/19/2015  . S/P aortic  valve replacement with bioprosthetic valve 12/06/2011  . Aortic stenosis 11/30/2011  . Chronic daily headache 11/30/2011  . Iron deficiency anemia, unspecified 09/15/2011  . IBS (irritable bowel syndrome) 09/15/2011  . Syncope 08/29/2011  . Cough syncope 10/13/2010  . HYPERLIPIDEMIA-MIXED 07/02/2008  . Essential hypertension 07/02/2008  . GERD 07/02/2008  . OBSTRUCTIVE SLEEP APNEA 01/15/2007  . ALLERGIC  RHINITIS 01/15/2007  . INSOMNIA 01/15/2007  . OSTEOARTHRITIS 12/02/2006  . BPH (benign prostatic hyperplasia) 12/02/2006  . H/O aortic valve replacement 12/02/2006    Orientation RESPIRATION BLADDER Height & Weight     Self, Time, Situation, Place  Normal Continent Weight: 76.9 kg (169 lb 8 oz) Height:  5' 9" (175.3 cm)  BEHAVIORAL SYMPTOMS/MOOD NEUROLOGICAL BOWEL NUTRITION STATUS      Continent(Biliary tube RUQ) Diet(Please see DC Summary)  AMBULATORY STATUS COMMUNICATION OF NEEDS Skin   Extensive Assist Verbally Normal                       Personal Care Assistance Level of Assistance  Bathing, Feeding, Dressing Bathing Assistance: Maximum assistance Feeding assistance: Limited assistance Dressing Assistance: Limited assistance     Functional Limitations Info  Sight, Hearing, Speech Sight Info: Adequate Hearing Info: Adequate Speech Info: Adequate    SPECIAL CARE FACTORS FREQUENCY  PT (By licensed PT), OT (By licensed OT)     PT Frequency: 5x/week OT Frequency: 3x/week            Contractures      Additional Factors Info  Code Status, Allergies, Insulin Sliding Scale Code Status Info:   Partial  Allergies Info: Avodart Dutasteride, Spironolactone, Codeine, Doxazosin, Feraheme Ferumoxytol   Insulin Sliding Scale Info: 3x dialy with meals and at bedtime       Current Medications (06/27/2017):  This is the current hospital active medication list Current Facility-Administered Medications  Medication Dose Route Frequency Provider Last Rate Last Dose   . 0.9 %  sodium chloride infusion  250 mL Intravenous PRN Goodrich, Daniel P, MD      . acetaminophen (TYLENOL) tablet 650 mg  650 mg Oral Q6H PRN Goodrich, Daniel P, MD   325 mg at 06/27/17 0815   Or  . acetaminophen (TYLENOL) suppository 650 mg  650 mg Rectal Q6H PRN Goodrich, Daniel P, MD      . carbidopa-levodopa (SINEMET IR) 25-100 MG per tablet immediate release 1 tablet  1 tablet Oral TID Goodrich, Daniel P, MD   1 tablet at 06/26/17 2226  . finasteride (PROSCAR) tablet 5 mg  5 mg Oral Daily Goodrich, Daniel P, MD   5 mg at 06/26/17 0902  . gabapentin (NEURONTIN) capsule 400 mg  400 mg Oral BID Nettey, Ralph A, MD   400 mg at 06/27/17 0816   And  . gabapentin (NEURONTIN) capsule 800 mg  800 mg Oral BID Nettey, Ralph A, MD   800 mg at 06/26/17 2124  . insulin aspart (novoLOG) injection 0-5 Units  0-5 Units Subcutaneous QHS Nettey, Ralph A, MD      . insulin aspart (novoLOG) injection 0-9 Units  0-9 Units Subcutaneous TID WC Nettey, Ralph A, MD   2 Units at 06/26/17 1732  . morphine (MSIR) tablet 7.5 mg  7.5 mg Oral Q4H PRN Goodrich, Daniel P, MD      . ondansetron (ZOFRAN) tablet 4 mg  4 mg Oral Q8H PRN Goodrich, Daniel P, MD   4 mg at 06/23/17 1914  . pantoprazole (PROTONIX) EC tablet 40 mg  40 mg Oral Q12H Nettey, Ralph A, MD   40 mg at 06/27/17 0608  . polyvinyl alcohol (LIQUIFILM TEARS) 1.4 % ophthalmic solution 1 drop  1 drop Both Eyes BID PRN Goodrich, Daniel P, MD      . senna-docusate (Senokot-S) tablet 2 tablet  2 tablet Oral QHS Nettey, Ralph A, MD   2 tablet at 06/26/17 2226  . simethicone (MYLICON) chewable tablet 160 mg  160 mg Oral QID PRN Schorr, Katherine P, NP   160 mg at 06/27/17 0358  . sodium chloride flush (NS) 0.9 % injection 10-40 mL  10-40 mL Intracatheter PRN Goodrich, Daniel P, MD   10 mL at 06/24/17 0516  . sodium chloride flush (NS) 0.9 % injection 3 mL  3 mL Intravenous Q12H Goodrich, Daniel P, MD   3 mL at 06/26/17 2226  . sodium chloride flush (NS) 0.9 %  injection 3 mL  3 mL Intravenous PRN Goodrich, Daniel P, MD      . traMADol (ULTRAM) tablet 50 mg  50 mg Oral Q6H PRN Goodrich, Daniel P, MD   50 mg at 06/27/17 0506  . traZODone (DESYREL) tablet 50 mg  50 mg Oral QHS Goodrich, Daniel P, MD   50 mg at 06/26/17 2226     Discharge Medications: Please see discharge summary for a list of discharge medications.  Relevant Imaging Results:  Relevant Lab Results:   Additional Information SSN: [id number] Patient has multiple out of county follow-up appointments for Multiple Myeloma treatment that he needs transport to.    S , LCSWA    

## 2017-06-28 LAB — CBC
HCT: 27.7 % — ABNORMAL LOW (ref 39.0–52.0)
HEMOGLOBIN: 8.6 g/dL — AB (ref 13.0–17.0)
MCH: 29.7 pg (ref 26.0–34.0)
MCHC: 31 g/dL (ref 30.0–36.0)
MCV: 95.5 fL (ref 78.0–100.0)
Platelets: 74 10*3/uL — ABNORMAL LOW (ref 150–400)
RBC: 2.9 MIL/uL — AB (ref 4.22–5.81)
RDW: 20.7 % — ABNORMAL HIGH (ref 11.5–15.5)
WBC: 4.7 10*3/uL (ref 4.0–10.5)

## 2017-06-28 LAB — GLUCOSE, CAPILLARY
GLUCOSE-CAPILLARY: 128 mg/dL — AB (ref 65–99)
GLUCOSE-CAPILLARY: 124 mg/dL — AB (ref 65–99)
Glucose-Capillary: 88 mg/dL (ref 65–99)
Glucose-Capillary: 196 mg/dL — ABNORMAL HIGH (ref 65–99)

## 2017-06-28 MED ORDER — BISACODYL 10 MG RE SUPP
10.0000 mg | Freq: Once | RECTAL | Status: AC
  Administered 2017-06-28: 10 mg via RECTAL
  Filled 2017-06-28: qty 1

## 2017-06-28 MED ORDER — ALTEPLASE 2 MG IJ SOLR
2.0000 mg | Freq: Once | INTRAMUSCULAR | Status: DC
  Filled 2017-06-28 (×2): qty 2

## 2017-06-28 MED ORDER — POLYETHYLENE GLYCOL 3350 17 G PO PACK
17.0000 g | PACK | Freq: Two times a day (BID) | ORAL | Status: DC
  Filled 2017-06-28: qty 1

## 2017-06-28 NOTE — Consult Note (Addendum)
   [facility] CM Inpatient Consult   06/28/2017  [name] 10/27/1945 [id number]    Patient screened for potential [facility] Care Management due to medium unplanned readmission risk score of 21%.  Also patient was referred for [facility] First program.  [name] does not have a [facility] Primary Care Provider. He goes to [facility] for PCP services. Therefore, patient is not eligible for [facility] Care Management at this time due not have a [facility] Provider.  Inpatient RNCM aware.   Marthenia Rolling, MSN-Ed, RN,BSN [facility] Liaison [phone]

## 2017-06-28 NOTE — Care Management Note (Addendum)
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 Resides with wife, [name].  Pt owns walker, cane, w/c, rollator.  Aydyn Testerman (Spouse) Faith Rogue (Sister)    [phone]      PCP: [facility] / Dr. Ferdinand Lango. Darlina Sicilian, MD Fax - [phone] [address] [phone], ext [phone] Pager [phone]  [phone] [facility] AND COMMUNITY [facility] TEAM for authorization and questions regarding home health services  Action/Plan: Referral made with [facility] for Home 1st program per NCM , however pt's PCP not ACO registered therefore pt  unable to be accepted into program.  Wife states will provide transportation to home @ d/c.  Expected Discharge Date:                  Expected Discharge Plan:  [place]  In-House Referral:  Clinical Social Work  Discharge planning Services  CM Consult  Post Acute Care Choice:    Choice offered to:   pt/spouse  DME Arranged:   3 IN 1/[facility] DME Agency:   [facility] HH Arranged:   PT,OT,NA,RN [place], pending MD's orders. Orders requested per NCM.  06/28/2017 @ 4:30pm NCM faxed home health orders to pt's  [facility]- PCP for authorization @  [phone]  Status of Service:  In process, will continue to follow  If discussed at Long Length of Stay Meetings, dates discussed:    Additional Comments:  Sharin Mons, RN 06/28/2017, 10:22 AM

## 2017-06-28 NOTE — Progress Notes (Signed)
CSW notes patient and spouse prefer to discharge home instead of SNF.   CSW signing off.  Percell Locus Candas Deemer LCSW [phone]

## 2017-06-28 NOTE — Progress Notes (Signed)
Occupational Therapy Evaluation Patient Details Name: [name] MRN: [id number] DOB: 03-Feb-1946 Today's Date: 06/28/2017    History of Present Illness 72 yo male with onset of anemia and recent NSTEMI was admitted, has CHF and was transfused and on bedrest. Has central line and cholecystostomy tube R lateral trunk.  PMHx:  femoral bypass, severe aortic stenosis, multiple myeloma, DM, anemia, gout, OA, HTN, OSA,    Clinical Impression   PTA, pt was overall modified independent with ADL and mobility. Pt used a w/c for longer distances in community but ambulated @ house occasionally with use of cane if needed. Pt demonstrates a decline in current level of occupational performance, requiring min A for mobility and LB ADL and feel he would benefit from [facility] program like Home First is available/appropriate. Pt states he does not want to go to SNF. Wife/family are available to assist after DC. Discussed with CM Levada Dy). Will follow acutely to facilitate safe DC home with 24/7 S.     Follow Up Recommendations  Home health OT(Home First if appropriate)    Equipment Recommendations  None recommended by OT    Recommendations for Other Services       Precautions / Restrictions Precautions Precautions: Fall(lines in chest R lateral and central anterior locations) Restrictions Weight Bearing Restrictions: No      Mobility Bed Mobility Overal bed mobility: Needs Assistance Bed Mobility: Supine to Sit     Supine to sit: Supervision        Transfers Overall transfer level: Needs assistance   Transfers: Sit to/from Stand Sit to Stand: Min guard              Balance Overall balance assessment: Needs assistance Sitting-balance support: Bilateral upper extremity supported;Feet supported Sitting balance-Leahy Scale: Good     Standing balance support: During functional activity;Single extremity supported Standing balance-Leahy Scale: Fair                              ADL either performed or assessed with clinical judgement   ADL Overall ADL's : Needs assistance/impaired     Grooming: Set up;Sitting   Upper Body Bathing: Set up;Sitting   Lower Body Bathing: Minimal assistance;Sit to/from stand   Upper Body Dressing : Set up;Sitting   Lower Body Dressing: Moderate assistance;Sit to/from stand   Toilet Transfer: Min guard;Ambulation   Toileting- Clothing Manipulation and Hygiene: Supervision/safety;Sit to/from stand Toileting - Clothing Manipulation Details (indicate cue type and reason): pt managing his drainage bag with min vc from wife     Functional mobility during ADLs: Min guard;pt furniture walking; states he has been walking to the bathroom without use of RW; Cueing for safety General ADL Comments: greater difficulty with LB ADL due to discomfort from edema/drainage tube     Vision Baseline Vision/History: Wears glasses Additional Comments: reports of intermittent diplopia - will further assess     Perception     Praxis      Pertinent Vitals/Pain Faces Pain Scale: Hurts little more Pain Location: R trunk/drain site     Hand Dominance Right   Extremity/Trunk Assessment Upper Extremity Assessment Upper Extremity Assessment: Generalized weakness(hx of shoulder problems)   Lower Extremity Assessment Lower Extremity Assessment: Defer to PT evaluation   Cervical / Trunk Assessment Cervical / Trunk Assessment: Kyphotic   Communication Communication Communication: No difficulties   Cognition Arousal/Alertness: Awake/alert Behavior During Therapy: Impulsive(easily frustrated) Overall Cognitive Status: Difficult to assess  General Comments: Appeasr impaired however, most likely balseine   General Comments       Exercises     Shoulder Instructions      Home Living Family/patient expects to be discharged to:: Private residence Living Arrangements: Spouse/significant  other Available Help at Discharge: Family;Available 24 hours/day Type of Home: House Home Access: Level entry     Home Layout: One level     Bathroom Shower/Tub: Occupational psychologist: Handicapped height Bathroom Accessibility: No   Home Equipment: Environmental consultant - 2 wheels;Walker - 4 wheels;Bedside commode;Grab bars - tub/shower;Hand held shower head;Cane - single point;Shower seat - built in;Wheelchair - manual          Prior Functioning/Environment Level of Independence: Needs assistance    ADL's / Homemaking Assistance Needed: pt modifiedindependent with ADL and mobility   Comments: used cane, RW or WC for mobility depending on energy.         OT Problem List:        OT Treatment/Interventions: Self-care/ADL training;Therapeutic exercise;Energy conservation;DME and/or AE instruction;Therapeutic activities;Patient/family education;Balance training    OT Goals(Current goals can be found in the care plan section) Acute Rehab OT Goals Patient Stated Goal: to walk and get stronger OT Goal Formulation: With patient/family Time For Goal Achievement: 07/12/17 Potential to Achieve Goals: Good  OT Frequency: Min 2X/week   Barriers to D/C:            Co-evaluation              AM-PAC PT "6 Clicks" Daily Activity     Outcome Measure Help from another person eating meals?: None Help from another person taking care of personal grooming?: None Help from another person toileting, which includes using toliet, bedpan, or urinal?: A Little Help from another person bathing (including washing, rinsing, drying)?: A Little Help from another person to put on and taking off regular upper body clothing?: A Little Help from another person to put on and taking off regular lower body clothing?: A Lot 6 Click Score: 19   End of Session Equipment Utilized During Treatment: Gait belt Nurse Communication: Mobility status  Activity Tolerance: Patient tolerated treatment  well Patient left: in chair;with call bell/phone within reach;with family/visitor present  OT Visit Diagnosis: Unsteadiness on feet (R26.81);Muscle weakness (generalized) (M62.81);Other symptoms and signs involving cognitive function;Pain Pain - Right/Left: Right Pain - part of body: (flank from drainage tube)                Time: 2122-4825 OT Time Calculation (min): 29 min Charges:  OT General Charges $OT Visit: 1 Visit OT Evaluation $OT Eval Moderate Complexity: 1 Mod OT Treatments $Self Care/Home Management : 8-22 mins G-Codes:     Maurie Boettcher, OT/L  OT Clinical Specialist [phone]   [facility] 06/28/2017, 10:39 AM

## 2017-06-28 NOTE — Progress Notes (Signed)
PROGRESS NOTE    [name]  YBO:[id number] DOB: [birth year] DOA: 06/23/2017 PCP: [facility]    Brief Narrative: [name] is a 72 y.o. 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  Patient presented secondary to hypotension found to be severely anemic. He has received 4 units of PRBC. Physical therapy recommending SNF. Patient decline SNF.   No BM in days.     Assessment & Plan:   Principal Problem:   Symptomatic anemia Active Problems:   Aortic stenosis   Multiple myeloma not having achieved remission (HCC)   GI bleed   Paravalvular leak of prosthetic heart valve   Type II diabetes mellitus with complication (HCC)   Demand ischemia (HCC)   Hyponatremia   Goals of care, counseling/discussion   Palliative care by specialist   Protein-calorie malnutrition, severe   Thrombocytopenia ([place])  1-Symptomatic Anemia;  Multifactorial. Hb on admission at 5.7. Seven month ago at 8.6.  Hb this am at 8.6 stable.  Status post 2 units of PRBC on 5/17 and another 2 units on 5/18. Good hemoglobin response. LDH elevated. Haptoglobin undetectable. Repeat lab in am.  Needs ferrous sulfate when constipation resolved.   Constipation; abdominal distension.  Abdomen distended.  Received miralx, senna.  If no BM , plan to get suppository   Aortic stenosis;  Status post valve replacement.  Elevation of troponin.  Cardiology consulted on 5/17.  Thought secondary to demand ischemia in setting of severe illness.  Troponin elevated to over 3 . Dr Lonny Prude  discussed with cardiology , elevation troponin likely related to acute illness.   Diplopia; chronic problem. MRI negative for acute stroke.   DM; SSI.   Chronic cholecystitis; Patient is status post cholecystostomy tube.  Patient with persistent elevated AST, ALT,  alk phos, bilirubin.  Stable.   Multiple myeloma;  Patient follows with [facility] as an outpatient.  Hypotension; in setting anemia and oratic stenosis.   Thrombocytopenia.  No overt bleeding.   Severe protein calorie malnutrition Seen by dietician. Diet liberalized. Protein supplements.  palliative was consulted. Patient wants to continue with current medical management.     DVT prophylaxis: SCD  Code Status: partial  Family Communication: sister who was at bedside.  Disposition Plan: home in 24 hour if hb stable and GI issues resolved.    Consultants:   Palliative    Procedures:    Antimicrobials:   None    Subjective: No good  BM in a week. Feels will have one soon.  Decline SNF./  Doesn't feel ready today to go home.   Objective: Vitals:   06/27/17 1309 06/27/17 2115 06/28/17 0446 06/28/17 1356  BP: 114/64 113/74 108/68 101/61  Pulse: 75 78 79 78  Resp:    20  Temp: 97.9 F (36.6 C) 98.7 F (37.1 C) 98 F (36.7 C) 98 F (36.7 C)  TempSrc: Oral Oral Oral Oral  SpO2: 100% 99% 98% 97%  Weight:      Height:        Intake/Output Summary (Last 24 hours) at 06/28/2017 1525 Last data filed at 06/28/2017 1300 Gross per 24 hour  Intake 360 ml  Output 3100 ml  Net -2740 ml   Filed Weights   06/23/17 0158 06/24/17 1636  Weight: 77.1 kg (170 lb) 76.9 kg (169 lb 8 oz)    Examination:  General exam: Appears calm  and comfortable  Respiratory system: Clear to auscultation. Respiratory effort normal. Cardiovascular system: S1 & S2 heard, RRR. No JVD, murmurs, rubs, gallops or clicks. No pedal edema. Gastrointestinal system: soft, distended, cholecystostomy tube on the right.  Central nervous system: Alert and oriented. No focal neurological deficits. Extremities: Symmetric 5 x 5 power. Skin: No rashes, lesions or ulcers   Data Reviewed: I have personally reviewed following labs and imaging studies  CBC: Recent Labs  Lab 06/23/17 0253   06/24/17 0508 06/25/17 0201 06/25/17 1548 06/27/17 0754 06/28/17 0522  WBC 6.7   < > 6.2 6.6 8.0 5.3 4.7  NEUTROABS 5.8  --   --   --   --   --   --   HGB 5.7*   < > 7.0* 8.9* 9.0* 8.2* 8.6*  HCT 18.8*   < > 22.2* 27.3* 28.6* 26.2* 27.7*  MCV 96.9   < > 95.3 93.2 95.0 93.6 95.5  PLT 124*   < > 104* 98* 110* 69* 74*   < > = values in this interval not displayed.   Basic Metabolic Panel: Recent Labs  Lab 06/23/17 0253 06/24/17 0508  NA 132* 134*  K 3.6 3.8  CL 97* 98*  CO2 22 26  GLUCOSE 129* 125*  BUN 24* 35*  CREATININE 0.52* 0.76  CALCIUM 9.4 9.6   GFR: Estimated Creatinine Clearance: 85.9 mL/min (by C-G formula based on SCr of 0.76 mg/dL). Liver Function Tests: Recent Labs  Lab 06/23/17 0253  AST 45*  ALT 13*  ALKPHOS 96  BILITOT 3.6*  PROT 5.3*  ALBUMIN 2.8*   No results for input(s): LIPASE, AMYLASE in the last 168 hours. No results for input(s): AMMONIA in the last 168 hours. Coagulation Profile: No results for input(s): INR, PROTIME in the last 168 hours. Cardiac Enzymes: Recent Labs  Lab 06/23/17 0253 06/23/17 0552 06/23/17 1905  TROPONINI 0.16* 0.49* 3.10*   BNP (last 3 results) No results for input(s): PROBNP in the last 8760 hours. HbA1C: No results for input(s): HGBA1C in the last 72 hours. CBG: Recent Labs  Lab 06/27/17 1235 06/27/17 1741 06/27/17 2115 06/28/17 0759 06/28/17 1206  GLUCAP 144* 108* 99 88 128*   Lipid Profile: No results for input(s): CHOL, HDL, LDLCALC, TRIG, CHOLHDL, LDLDIRECT in the last 72 hours. Thyroid Function Tests: No results for input(s): TSH, T4TOTAL, FREET4, T3FREE, THYROIDAB in the last 72 hours. Anemia Panel: No results for input(s): VITAMINB12, FOLATE, FERRITIN, TIBC, IRON, RETICCTPCT in the last 72 hours. Sepsis Labs: No results for input(s): PROCALCITON, LATICACIDVEN in the last 168 hours.  Recent Results (from the past 240 hour(s))  MRSA PCR Screening     Status: None   Collection Time: 06/23/17   7:20 PM  Result Value Ref Range Status   MRSA by PCR NEGATIVE NEGATIVE Final    Comment:        The GeneXpert MRSA Assay (FDA approved for NASAL specimens only), is one component of a comprehensive MRSA colonization surveillance program. It is not intended to diagnose MRSA infection nor to guide or monitor treatment for MRSA infections. Performed at [facility], [place] [address], [place], [place] [address]          Radiology Studies: Mr Brain 17 Contrast  Result Date: 06/27/2017 CLINICAL DATA:  72 y/o  M; diplopia, hypotension, severe anemia. EXAM: MRI HEAD WITHOUT CONTRAST TECHNIQUE: Multiplanar, multiecho pulse sequences of the brain and surrounding structures were obtained without intravenous contrast. COMPARISON:  None. FINDINGS: Brain:  No acute infarction, hydrocephalus, extra-axial collection or mass lesion. There multiple punctate foci of susceptibility hypointensity scattered diffusely throughout the brain compatible with hemosiderin deposition of chronic microhemorrhage. Several nonspecific foci of T2 FLAIR hyperintense signal abnormality in subcortical and periventricular white matter are compatible with mild chronic microvascular ischemic changes for age. Mild brain parenchymal volume loss. Vascular: Normal flow voids. Skull and upper cervical spine: Normal marrow signal. Sinuses/Orbits: Negative. Other: None. IMPRESSION: 1. No acute intracranial abnormality identified. 2. Mild for age chronic microvascular ischemic changes and parenchymal volume loss of the brain. 3. Multiple diffuse nonspecific punctate foci of chronic microhemorrhage in the brain may be due to sequelae of thrombocytopenia/coagulopathy, hypertension, or amyloid vasculopathy. Electronically Signed   By: Kristine Garbe M.D.   On: 06/27/2017 20:56        Scheduled Meds: . alteplase  2 mg Intracatheter Once  . bisacodyl  10 mg Rectal Once  . carbidopa-levodopa  1 tablet Oral TID  .  finasteride  5 mg Oral Daily  . gabapentin  400 mg Oral BID   And  . gabapentin  800 mg Oral BID  . insulin aspart  0-5 Units Subcutaneous QHS  . insulin aspart  0-9 Units Subcutaneous TID WC  . pantoprazole  40 mg Oral Q12H  . polyethylene glycol  17 g Oral Daily  . senna-docusate  2 tablet Oral QHS  . sodium chloride flush  3 mL Intravenous Q12H  . traZODone  50 mg Oral QHS   Continuous Infusions: . sodium chloride       LOS: 5 days    Time spent: 35 minutes.     Elmarie Shiley, MD [facility] Pager [phone]  If 7PM-7AM, please contact night-coverage [email or web address] Password [facility] 06/28/2017, 3:25 PM

## 2017-06-28 NOTE — Progress Notes (Signed)
Inpatient Rehabilitation-Admissions Coordinator   Spoke with pt and wife at the bedside as follow up from PM&R consult. AC discussed recommendations for rehab venue. AC relayed concerns over activity tolerance for CIR with pt also sharing these same concerns. Pt stated he wants to go home and has no plans to return to this particular hospital; he reports he is close to baseline and plans to have [facility] therapy once home. Wife mentioned wanting more information about [facility] therapy and [facility] program with these requests relayed to CM. Family grateful for information provided from [facility]. CIR to sign off.   Jhonnie Garner, OTR/L  Rehab Admissions Coordinator  [phone] 06/28/2017 2:46 PM

## 2017-06-28 NOTE — Progress Notes (Signed)
Physical Therapy Treatment Patient Details Name: [name] MRN: [id number] DOB: September 23, 1945 Today's Date: 06/28/2017    History of Present Illness 72 yo male with onset of anemia and recent NSTEMI was admitted, has CHF and was transfused and on bedrest. Has central line and cholecystostomy tube R lateral trunk.  PMHx:  femoral bypass, severe aortic stenosis, multiple myeloma, DM, anemia, gout, OA, HTN, OSA,     PT Comments    Continuing work on functional mobility and activity tolerance;  Today's session limited by pt's need to move bowels; assisted him to the [facility]; he is hesitant to accept physical help, and was minguard (very close guard) to take steps to [facility];   Pt and wife declining SNF for rehab therapies; they would prefer going home, and it is worth considering the [place] program; would rec HHPT and HHOT follow up  Follow Up Recommendations  Home health PT;Supervision/Assistance - 24 hour(pt and wife refusing SNF; Consider [facility] First program)     Equipment Recommendations  None recommended by PT    Recommendations for Other Services       Precautions / Restrictions Precautions Precautions: Fall(lines in chest R lateral and central anterior locations)    Mobility  Bed Mobility Overal bed mobility: Needs Assistance Bed Mobility: Supine to Sit     Supine to sit: Supervision     General bed mobility comments: Supervision for safety adn lines  Transfers Overall transfer level: Needs assistance Equipment used: None(pt declined RW) Transfers: Sit to/from Stand Sit to Stand: Min guard         General transfer comment: Heavy dependence on UEs to push up to stand  Ambulation/Gait Ambulation/Gait assistance: Min guard Ambulation Distance (Feet): 3 Feet(including pivot steps to [facility]) Assistive device: None(Pt refused physical contact; but close guard for safety) Gait Pattern/deviations: Step-to pattern;Decreased stride length;Wide base of  support;Trunk flexed     General Gait Details: Took a few steps bed to [facility]; Refused RW and declined handheld assist; very close guard for safety, assist with drain management   Stairs             Wheelchair Mobility    Modified Rankin (Stroke Patients Only)       Balance     Sitting balance-Leahy Scale: Good       Standing balance-Leahy Scale: Fair                              Cognition Arousal/Alertness: Awake/alert Behavior During Therapy: Impulsive(easily frustrated) Overall Cognitive Status: Difficult to assess                                 General Comments: Appeasr impaired however, most likely balseine      Exercises      General Comments        Pertinent Vitals/Pain Pain Assessment: Faces Faces Pain Scale: Hurts a little bit Pain Location: R trunk/drain site Pain Descriptors / Indicators: Discomfort Pain Intervention(s): Monitored during session    Home Living                      Prior Function            PT Goals (current goals can now be found in the care plan section) Acute Rehab PT Goals Patient Stated Goal: to walk and get stronger PT Goal Formulation:  With patient/family Time For Goal Achievement: 07/09/17 Potential to Achieve Goals: Fair Progress towards PT goals: Progressing toward goals(slowly)    Frequency    Min 3X/week      PT Plan Current plan remains appropriate;Frequency needs to be updated    Co-evaluation              AM-PAC PT "6 Clicks" Daily Activity  Outcome Measure  Difficulty turning over in bed (including adjusting bedclothes, sheets and blankets)?: A Lot Difficulty moving from lying on back to sitting on the side of the bed? : A Lot Difficulty sitting down on and standing up from a chair with arms (e.g., wheelchair, bedside commode, etc,.)?: A Lot Help needed moving to and from a bed to chair (including a wheelchair)?: A Little Help needed walking in  hospital room?: A Little Help needed climbing 3-5 steps with a railing? : Total 6 Click Score: 13    End of Session   Activity Tolerance: Patient tolerated treatment well Patient left: with call bell/phone within reach;Other (comment)(sitting on [facility]) Nurse Communication: Mobility status;Other (comment)(on BSC, agreeable to having a suppository) PT Visit Diagnosis: Unsteadiness on feet (R26.81);Other abnormalities of gait and mobility (R26.89);Muscle weakness (generalized) (M62.81);Difficulty in walking, not elsewhere classified (R26.2);Adult, failure to thrive (R62.7)     Time: 2072-1828 PT Time Calculation (min) (ACUTE ONLY): 20 min  Charges:  $Therapeutic Activity: 8-22 mins                    G Codes:       Roney Marion, PT  Acute Rehabilitation Services Pager [phone] Office [place] 06/28/2017, 4:09 PM

## 2017-06-29 LAB — HEMOGLOBIN AND HEMATOCRIT, BLOOD
HEMATOCRIT: 26.2 % — AB (ref 39.0–52.0)
HEMOGLOBIN: 8.2 g/dL — AB (ref 13.0–17.0)

## 2017-06-29 LAB — GLUCOSE, CAPILLARY: Glucose-Capillary: 99 mg/dL (ref 65–99)

## 2017-06-29 MED ORDER — HEPARIN SOD (PORK) LOCK FLUSH 100 UNIT/ML IV SOLN
500.0000 [IU] | INTRAVENOUS | Status: AC | PRN
  Administered 2017-06-29: 500 [IU]

## 2017-06-29 MED ORDER — POLYVINYL ALCOHOL 1.4 % OP SOLN: 1.0000 [drp] | Freq: Two times a day (BID) | OPHTHALMIC | 0 refills | Status: AC | PRN

## 2017-06-29 MED ORDER — POLYETHYLENE GLYCOL 3350 17 G PO PACK: 17.0000 g | PACK | Freq: Two times a day (BID) | ORAL | 0 refills | Status: AC

## 2017-06-29 MED ORDER — PANTOPRAZOLE SODIUM 40 MG PO TBEC: 40.0000 mg | DELAYED_RELEASE_TABLET | Freq: Two times a day (BID) | ORAL | 0 refills | Status: AC

## 2017-06-29 NOTE — Progress Notes (Signed)
Nsg Discharge Note  Admit Date:  06/23/2017 Discharge date: 06/29/2017   Earney Mallet to be D/C'd Home per MD order.  AVS completed.  Copy for chart, and copy for patient signed, and dated. Patient/caregiver able to verbalize understanding.  Discharge Medication: Allergies as of 06/29/2017      Reactions   Avodart [dutasteride] Other (See Comments)   Lose of use of arms and legs   Spironolactone Other (See Comments)   Low tolerance    Codeine Other (See Comments)   GI upset in large doses   Doxazosin Other (See Comments)   Dizziness   Feraheme [ferumoxytol] Swelling   Pedal edema      Medication List    STOP taking these medications   ixazomib citrate 3 MG capsule Commonly known as:  NINLARO     TAKE these medications   acetaminophen 325 MG tablet Commonly known as:  TYLENOL Take 650 mg by mouth every 6 (six) hours as needed for fever.   carbidopa-levodopa 25-100 MG tablet Commonly known as:  SINEMET IR Take 1 tablet by mouth 3 (three) times daily.   docusate sodium 100 MG capsule Commonly known as:  COLACE Take 100 mg by mouth 2 (two) times daily as needed for moderate constipation.   finasteride 5 MG tablet Commonly known as:  PROSCAR Take 5 mg by mouth daily.   fish oil-omega-3 fatty acids 1000 MG capsule Take 1 g by mouth daily with lunch.   folic acid 1 MG tablet Commonly known as:  FOLVITE Take 1 mg by mouth daily.   furosemide 40 MG tablet Commonly known as:  LASIX TAKE 2 TABLETS (80 MG TOTAL) BY MOUTH DAILY. TAKE A EXTRA TABLET ON DAYS OF DIALYSIS What changed:    how much to take  when to take this  additional instructions   gabapentin 800 MG tablet Commonly known as:  NEURONTIN Take 400-800 mg by mouth See admin instructions. Take 1/2 tablet every morning and at bedtime then take 1 tablet at lunch and dinner   metFORMIN 500 MG tablet Commonly known as:  GLUCOPHAGE Take 500 mg by mouth 2 (two) times daily with a meal.   morphine 15 MG  tablet Commonly known as:  MSIR Take 7.5 mg by mouth every 4 (four) hours as needed for moderate pain.   ondansetron 4 MG tablet Commonly known as:  ZOFRAN Take 4 mg by mouth every 8 (eight) hours as needed for nausea or vomiting.   pantoprazole 40 MG tablet Commonly known as:  PROTONIX Take 1 tablet (40 mg total) by mouth 2 (two) times daily. For GERD What changed:  when to take this   polyethylene glycol packet Commonly known as:  MIRALAX / GLYCOLAX Take 17 g by mouth 2 (two) times daily.   polyvinyl alcohol 1.4 % ophthalmic solution Commonly known as:  LIQUIFILM TEARS Place 1 drop into both eyes 2 (two) times daily as needed for dry eyes.   REFRESH 1.4-0.6 % Soln Generic drug:  Polyvinyl Alcohol-Povidone PF Place 1 drop into both eyes 2 (two) times daily as needed (dry eyes).   simvastatin 10 MG tablet Commonly known as:  ZOCOR Take 10 mg by mouth at bedtime.   traMADol 50 MG tablet Commonly known as:  ULTRAM Take 50 mg by mouth every 6 (six) hours as needed for moderate pain.   traZODone 50 MG tablet Commonly known as:  DESYREL Take 50 mg by mouth at bedtime.   UNABLE TO FIND Place 1  application into the right eye daily as needed (eye care). Ocular Ointment   vitamin B-12 500 MCG tablet Commonly known as:  CYANOCOBALAMIN Take 500 mcg by mouth 2 (two) times daily. Lunch/dinner   zinc oxide 20 % ointment Apply 1 application topically 2 (two) times daily as needed for irritation.            Durable Medical Equipment  (From admission, onward)        Start     Ordered   06/29/17 1033  For home use only DME 3 n 1  Once     06/29/17 1033      Discharge Assessment: Vitals:   06/28/17 2100 06/29/17 0559  BP: 123/78 116/80  Pulse: 85 79  Resp: 20 18  Temp: 98.1 F (36.7 C) 97.6 F (36.4 C)  SpO2: 100% 99%    D/c Instructions-Education: Discharge instructions given to patient/family with verbalized understanding. D/c education completed with  patient/family including follow up instructions, medication list, d/c activities limitations if indicated, with other d/c instructions as indicated by MD - patient able to verbalize understanding, all questions fully answered. Patient instructed to return to ED, call 911, or call MD for any changes in condition.  Patient escorted via [place], and D/C home via private auto.  [place] N Tannon Peerson, RN 06/29/2017 11:56 AM

## 2017-06-29 NOTE — Discharge Summary (Addendum)
Physician Discharge Summary  [name] CLE:[id number] DOB: 01-26-1946 DOA: 06/23/2017  PCP: [facility]  Admit date: 06/23/2017 Discharge date: 06/29/2017  Admitted From: Home  Disposition:  Home   Recommendations for Outpatient Follow-up:  1. Follow up with PCP in 1-2 weeks 2. Please obtain BMP/CBC in one week 3. Blood transfusion as needed.   Home Health: Yes.   Discharge Condition: Stable.  CODE STATUS: Partial Code.  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 Patient presented secondary to hypotension found to be severely anemic. He has received 4 units of PRBC. Physical therapy recommending SNF. Patient decline SNF.   No BM in days.     Assessment & Plan:   Principal Problem:   Symptomatic anemia Active Problems:   Aortic stenosis   Multiple myeloma not having achieved remission (HCC)   GI bleed   Paravalvular leak of prosthetic heart valve   Type II diabetes mellitus with complication (HCC)   Demand ischemia (HCC)   Hyponatremia   Goals of care, counseling/discussion   Palliative care by specialist   Protein-calorie malnutrition, severe   Thrombocytopenia ([place])  1-Symptomatic Anemia;  Multifactorial. Hb on admission at 5.7. Seven month ago at 8.6.  Hb this am at 8.6 stable.  Status post 2 units of PRBC on 5/17 and another 2 units on 5/18. Good hemoglobin response. LDH elevated. Haptoglobin undetectable. Hb stable. Continue with PPI. He has an appointment tomorrow at his Dr office where they can give him Blood transfusion.    Constipation; abdominal distension.  Abdomen distended.  Received miralx, senna.  Had BM   Aortic stenosis;  Status post valve replacement.  Elevation of  troponin.  Cardiology consulted on 5/17. Thought secondary to demand ischemia in setting of severe illness. Troponin elevated to over 3 . Dr [name]  discussed with cardiology , elevation troponin likely related to acute illness.   Diplopia; chronic problem. MRI negative for acute stroke.   DM; SSI.   Chronic cholecystitis; Patient is status post cholecystostomy tube. Patient with persistent elevated AST, ALT, alk phos, bilirubin.  Stable.   Multiple myeloma;  Patient follows with [facility] as an outpatient.  Hypotension; in setting anemia and oratic stenosis.   Thrombocytopenia.  Hb stable.   Severe protein calorie malnutrition Seen by dietician. Diet liberalized. Protein supplements.  palliative was consulted. Patient wants to continue with current medical management.         Discharge Diagnoses:  Principal Problem:   Symptomatic anemia Active Problems:   Aortic stenosis   Multiple myeloma not having achieved remission (HCC)   GI bleed   Paravalvular leak of prosthetic heart valve   Type II diabetes mellitus with complication (HCC)   Demand ischemia (HCC)   Hyponatremia   Goals of care, counseling/discussion   Palliative care by specialist   Protein-calorie malnutrition, severe   Thrombocytopenia ([place])    Discharge Instructions  Discharge Instructions    Diet - low sodium heart healthy   Complete by:  As directed    Increase activity slowly   Complete by:  As directed      Allergies as of 06/29/2017      Reactions   Avodart [dutasteride] Other (See Comments)   Lose of use of arms and legs   Spironolactone Other (See Comments)  Low tolerance    Codeine Other (See Comments)   GI upset in large doses   Doxazosin Other (See Comments)   Dizziness   Feraheme [ferumoxytol] Swelling   Pedal edema      Medication List    STOP taking these medications   ixazomib citrate 3 MG capsule Commonly known as:  NINLARO     TAKE these medications    acetaminophen 325 MG tablet Commonly known as:  TYLENOL Take 650 mg by mouth every 6 (six) hours as needed for fever.   carbidopa-levodopa 25-100 MG tablet Commonly known as:  SINEMET IR Take 1 tablet by mouth 3 (three) times daily.   docusate sodium 100 MG capsule Commonly known as:  COLACE Take 100 mg by mouth 2 (two) times daily as needed for moderate constipation.   finasteride 5 MG tablet Commonly known as:  PROSCAR Take 5 mg by mouth daily.   fish oil-omega-3 fatty acids 1000 MG capsule Take 1 g by mouth daily with lunch.   folic acid 1 MG tablet Commonly known as:  FOLVITE Take 1 mg by mouth daily.   furosemide 40 MG tablet Commonly known as:  LASIX TAKE 2 TABLETS (80 MG TOTAL) BY MOUTH DAILY. TAKE A EXTRA TABLET ON DAYS OF DIALYSIS What changed:    how much to take  when to take this  additional instructions   gabapentin 800 MG tablet Commonly known as:  NEURONTIN Take 400-800 mg by mouth See admin instructions. Take 1/2 tablet every morning and at bedtime then take 1 tablet at lunch and dinner   metFORMIN 500 MG tablet Commonly known as:  GLUCOPHAGE Take 500 mg by mouth 2 (two) times daily with a meal.   morphine 15 MG tablet Commonly known as:  MSIR Take 7.5 mg by mouth every 4 (four) hours as needed for moderate pain.   ondansetron 4 MG tablet Commonly known as:  ZOFRAN Take 4 mg by mouth every 8 (eight) hours as needed for nausea or vomiting.   pantoprazole 40 MG tablet Commonly known as:  PROTONIX Take 1 tablet (40 mg total) by mouth 2 (two) times daily. For GERD What changed:  when to take this   polyethylene glycol packet Commonly known as:  MIRALAX / GLYCOLAX Take 17 g by mouth 2 (two) times daily.   polyvinyl alcohol 1.4 % ophthalmic solution Commonly known as:  LIQUIFILM TEARS Place 1 drop into both eyes 2 (two) times daily as needed for dry eyes.   REFRESH 1.4-0.6 % Soln Generic drug:  Polyvinyl Alcohol-Povidone PF Place 1 drop  into both eyes 2 (two) times daily as needed (dry eyes).   simvastatin 10 MG tablet Commonly known as:  ZOCOR Take 10 mg by mouth at bedtime.   traMADol 50 MG tablet Commonly known as:  ULTRAM Take 50 mg by mouth every 6 (six) hours as needed for moderate pain.   traZODone 50 MG tablet Commonly known as:  DESYREL Take 50 mg by mouth at bedtime.   UNABLE TO FIND Place 1 application into the right eye daily as needed (eye care). Ocular Ointment   vitamin B-12 500 MCG tablet Commonly known as:  CYANOCOBALAMIN Take 500 mcg by mouth 2 (two) times daily. Lunch/dinner   zinc oxide 20 % ointment Apply 1 application topically 2 (two) times daily as needed for irritation.            Durable Medical Equipment  (From admission, onward)  Start     Ordered   06/29/17 1033  For home use only DME 3 n 1  Once     06/29/17 1033     Follow-up Information    [facility].   Specialty:  Home Health Services Why:  Home health services arranged Contact information: [address] [place] [address] [phone]        [facility] Follow up in 1 week(s).   Contact information: [place] [address] [phone]        [place] Follow up.   Why:  bedside commode will be delivered prior to discharge Contact information: [place] [address] [phone]          Allergies  Allergen Reactions  . Avodart [Dutasteride] Other (See Comments)    Lose of use of arms and legs   . Spironolactone Other (See Comments)    Low tolerance   . Codeine Other (See Comments)    GI upset in large doses  . Doxazosin Other (See Comments)    Dizziness   . Feraheme [Ferumoxytol] Swelling    Pedal edema    Consultations:  none   Procedures/Studies: Mr Brain Wo Contrast  Result Date: 06/27/2017 CLINICAL DATA:  72 y/o  M; diplopia, hypotension,  severe anemia. EXAM: MRI HEAD WITHOUT CONTRAST TECHNIQUE: Multiplanar, multiecho pulse sequences of the brain and surrounding structures were obtained without intravenous contrast. COMPARISON:  None. FINDINGS: Brain: No acute infarction, hydrocephalus, extra-axial collection or mass lesion. There multiple punctate foci of susceptibility hypointensity scattered diffusely throughout the brain compatible with hemosiderin deposition of chronic microhemorrhage. Several nonspecific foci of T2 FLAIR hyperintense signal abnormality in subcortical and periventricular white matter are compatible with mild chronic microvascular ischemic changes for age. Mild brain parenchymal volume loss. Vascular: Normal flow voids. Skull and upper cervical spine: Normal marrow signal. Sinuses/Orbits: Negative. Other: None. IMPRESSION: 1. No acute intracranial abnormality identified. 2. Mild for age chronic microvascular ischemic changes and parenchymal volume loss of the brain. 3. Multiple diffuse nonspecific punctate foci of chronic microhemorrhage in the brain may be due to sequelae of thrombocytopenia/coagulopathy, hypertension, or amyloid vasculopathy. Electronically Signed   By: Kristine Garbe M.D.   On: 06/27/2017 20:56   Dg Chest Port 1 View  Result Date: 06/23/2017 CLINICAL DATA:  Weakness EXAM: PORTABLE CHEST 1 VIEW COMPARISON:  11/18/2016 FINDINGS: Postsurgical changes of the mediastinum with valve prosthetic. Right-sided central venous port tip overlies the proximal right atrium. Cardiomegaly with vascular congestion and mild diffuse interstitial opacity, likely mild edema. No pneumothorax. IMPRESSION: Cardiomegaly with vascular congestion and mild pulmonary edema Electronically Signed   By: Donavan Foil M.D.   On: 06/23/2017 03:00     Subjective: Had BM. Dark stool. He has had dark stool for years.  Feeling well day.   Discharge Exam: Vitals:   06/28/17 2100 06/29/17 0559  BP: 123/78 116/80  Pulse: 85  79  Resp: 20 18  Temp: 98.1 F (36.7 C) 97.6 F (36.4 C)  SpO2: 100% 99%   Vitals:   06/28/17 0446 06/28/17 1356 06/28/17 2100 06/29/17 0559  BP: 108/68 101/61 123/78 116/80  Pulse: 79 78 85 79  Resp:  20 20 18   Temp: 98 F (36.7 C) 98 F (36.7 C) 98.1 F (36.7 C) 97.6 F (36.4 C)  TempSrc: Oral Oral Oral Oral  SpO2: 98% 97% 100% 99%  Weight:  Height:        General: Pt is alert, awake, not in acute distress Cardiovascular: RRR, S1/S2 +, no rubs, no gallops Respiratory: CTA bilaterally, no wheezing, no rhonchi Abdominal: Soft, NT, ND, bowel sounds + Extremities: no edema, no cyanosis    The results of significant diagnostics from this hospitalization (including imaging, microbiology, ancillary and laboratory) are listed below for reference.     Microbiology: Recent Results (from the past 240 hour(s))  MRSA PCR Screening     Status: None   Collection Time: 06/23/17  7:20 PM  Result Value Ref Range Status   MRSA by PCR NEGATIVE NEGATIVE Final    Comment:        The GeneXpert MRSA Assay (FDA approved for NASAL specimens only), is one component of a comprehensive MRSA colonization surveillance program. It is not intended to diagnose MRSA infection nor to guide or monitor treatment for MRSA infections. Performed at [facility], [place] [address], [place], [place] [address]      Labs: BNP (last 3 results) Recent Labs    10/02/16 2004 11/18/16 2330 06/23/17 0253  BNP 416.3* 268.9* 6,269.4*   Basic Metabolic Panel: Recent Labs  Lab 06/23/17 0253 06/24/17 0508  NA 132* 134*  K 3.6 3.8  CL 97* 98*  CO2 22 26  GLUCOSE 129* 125*  BUN 24* 35*  CREATININE 0.52* 0.76  CALCIUM 9.4 9.6   Liver Function Tests: Recent Labs  Lab 06/23/17 0253  AST 45*  ALT 13*  ALKPHOS 96  BILITOT 3.6*  PROT 5.3*  ALBUMIN 2.8*   No results for input(s): LIPASE, AMYLASE in the last 168 hours. No results for input(s): AMMONIA in the last 168  hours. CBC: Recent Labs  Lab 06/23/17 0253  06/24/17 0508 06/25/17 0201 06/25/17 1548 06/27/17 0754 06/28/17 0522 06/29/17 0430  WBC 6.7   < > 6.2 6.6 8.0 5.3 4.7  --   NEUTROABS 5.8  --   --   --   --   --   --   --   HGB 5.7*   < > 7.0* 8.9* 9.0* 8.2* 8.6* 8.2*  HCT 18.8*   < > 22.2* 27.3* 28.6* 26.2* 27.7* 26.2*  MCV 96.9   < > 95.3 93.2 95.0 93.6 95.5  --   PLT 124*   < > 104* 98* 110* 69* 74*  --    < > = values in this interval not displayed.   Cardiac Enzymes: Recent Labs  Lab 06/23/17 0253 06/23/17 0552 06/23/17 1905  TROPONINI 0.16* 0.49* 3.10*   BNP: Invalid input(s): POCBNP CBG: Recent Labs  Lab 06/28/17 0759 06/28/17 1206 06/28/17 1654 06/28/17 2100 06/29/17 0759  GLUCAP 88 128* 196* 124* 99   D-Dimer No results for input(s): DDIMER in the last 72 hours. Hgb A1c No results for input(s): HGBA1C in the last 72 hours. Lipid Profile No results for input(s): CHOL, HDL, LDLCALC, TRIG, CHOLHDL, LDLDIRECT in the last 72 hours. Thyroid function studies No results for input(s): TSH, T4TOTAL, T3FREE, THYROIDAB in the last 72 hours.  Invalid input(s): FREET3 Anemia work up No results for input(s): VITAMINB12, FOLATE, FERRITIN, TIBC, IRON, RETICCTPCT in the last 72 hours. Urinalysis    Component Value Date/Time   COLORURINE AMBER (A) 06/23/2017 0215   APPEARANCEUR HAZY (A) 06/23/2017 0215   LABSPEC 1.021 06/23/2017 0215   PHURINE 5.0 06/23/2017 0215   GLUCOSEU NEGATIVE 06/23/2017 0215   GLUCOSEU NEGATIVE 04/25/2012 1108   HGBUR MODERATE (A) 06/23/2017 0215  BILIRUBINUR NEGATIVE 06/23/2017 0215   KETONESUR 5 (A) 06/23/2017 0215   PROTEINUR NEGATIVE 06/23/2017 0215   UROBILINOGEN 0.2 05/17/2012 1021   NITRITE NEGATIVE 06/23/2017 0215   LEUKOCYTESUR NEGATIVE 06/23/2017 0215   Sepsis Labs Invalid input(s): PROCALCITONIN,  WBC,  LACTICIDVEN Microbiology Recent Results (from the past 240 hour(s))  MRSA PCR Screening     Status: None   Collection  Time: 06/23/17  7:20 PM  Result Value Ref Range Status   MRSA by PCR NEGATIVE NEGATIVE Final    Comment:        The GeneXpert MRSA Assay (FDA approved for NASAL specimens only), is one component of a comprehensive MRSA colonization surveillance program. It is not intended to diagnose MRSA infection nor to guide or monitor treatment for MRSA infections. Performed at [facility], [place] [address], [place], [place] [address]      Time coordinating discharge: 35 minutes   SIGNED:   Elmarie Shiley, MD  [facility] 06/29/2017, 12:42 PM Pager   If 7PM-7AM, please contact night-coverage [email or web address] Password [email or web address]

## 2017-06-29 NOTE — Progress Notes (Signed)
Physical Therapy Treatment Patient Details Name: [name] MRN: [id number] DOB: 03/30/1945 Today's Date: 06/29/2017    History of Present Illness 72 yo male with onset of anemia and recent NSTEMI was admitted, has CHF and was transfused and on bedrest. Has central line and cholecystostomy tube R lateral trunk.  PMHx:  femoral bypass, severe aortic stenosis, multiple myeloma, DM, anemia, gout, OA, HTN, OSA,     PT Comments    Pt showing improved activity tolerance today as he ambulated 150 ft with RW and min guard for safety. Pt is expected to d/c home today with HHPT to follow up. Pt would benefit from continued skilled therapy to maximize activity tolerance and safety with mobility.     Follow Up Recommendations  Home health PT;Supervision/Assistance - 24 hour(pt and wife refusing SNF)     Equipment Recommendations  3in1 (PT)    Recommendations for Other Services       Precautions / Restrictions Precautions Precautions: Fall(lines in chest R lateral and central anterior locations) Restrictions Weight Bearing Restrictions: No    Mobility  Bed Mobility               General bed mobility comments: in chair on arrival  Transfers Overall transfer level: Needs assistance Equipment used: Rolling walker (2 wheeled) Transfers: Sit to/from Stand Sit to Stand: Min guard         General transfer comment: Min guard for safety to rise from recliner chair. VC for hand placement when returning to sitting.  Ambulation/Gait Ambulation/Gait assistance: Min guard Ambulation Distance (Feet): 150 Feet Assistive device: Rolling walker (2 wheeled) Gait Pattern/deviations: Step-to pattern;Decreased stride length;Wide base of support;Trunk flexed Gait velocity: reduced   General Gait Details: Min guard for safety with cues for RW safety.   Stairs             Wheelchair Mobility    Modified Rankin (Stroke Patients Only)       Balance Overall balance  assessment: Needs assistance Sitting-balance support: Bilateral upper extremity supported;Feet supported Sitting balance-Leahy Scale: Good     Standing balance support: During functional activity;Single extremity supported;Bilateral upper extremity supported Standing balance-Leahy Scale: Fair Standing balance comment: Reliant on UE for support                            Cognition Arousal/Alertness: Awake/alert Behavior During Therapy: WFL for tasks assessed/performed Overall Cognitive Status: Difficult to assess                                 General Comments: Appeasr impaired however, most likely balseine      Exercises      General Comments        Pertinent Vitals/Pain Pain Assessment: Faces Faces Pain Scale: Hurts a little bit Pain Location: R trunk/drain site Pain Descriptors / Indicators: Discomfort Pain Intervention(s): Monitored during session    Home Living                      Prior Function            PT Goals (current goals can now be found in the care plan section) Acute Rehab PT Goals Patient Stated Goal: to walk and get stronger PT Goal Formulation: With patient/family Time For Goal Achievement: 07/09/17 Potential to Achieve Goals: Fair Progress towards PT goals: Progressing toward goals    Frequency  Min 3X/week      PT Plan Current plan remains appropriate    Co-evaluation              AM-PAC PT "6 Clicks" Daily Activity  Outcome Measure  Difficulty turning over in bed (including adjusting bedclothes, sheets and blankets)?: A Lot Difficulty moving from lying on back to sitting on the side of the bed? : A Lot Difficulty sitting down on and standing up from a chair with arms (e.g., wheelchair, bedside commode, etc,.)?: A Lot Help needed moving to and from a bed to chair (including a wheelchair)?: A Little Help needed walking in hospital room?: A Little Help needed climbing 3-5 steps with a  railing? : A Lot 6 Click Score: 14    End of Session   Activity Tolerance: Patient tolerated treatment well Patient left: in chair;with call bell/phone within reach Nurse Communication: Mobility status PT Visit Diagnosis: Unsteadiness on feet (R26.81);Other abnormalities of gait and mobility (R26.89);Muscle weakness (generalized) (M62.81);Difficulty in walking, not elsewhere classified (R26.2);Adult, failure to thrive (R62.7)     Time: 0352-4818 PT Time Calculation (min) (ACUTE ONLY): 12 min  Charges:  $Gait Training: 8-22 mins                    G Codes:       Benjiman Core, [place] Pager [phone] Acute Rehab   Allena Katz 06/29/2017, 11:48 AM

## 2017-06-29 NOTE — Progress Notes (Signed)
PT Cancellation Note  Patient Details Name: [name] MRN: [id number] DOB: 02/10/1945   Cancelled Treatment:    Reason Eval/Treat Not Completed: Patient declined, no reason specified. Pt sitting up at EOB on arrival eating breakfast. Wife and pt refusing treatment stating he needs to eat. Advised pt therapy would likely not be able to return before d/c. Spoke with pt and wife briefly about concerns for going home and equipment requirements. Updated equipment recommendations to include BSC.  Benjiman Core, PTA Pager [phone] Acute Rehab   Allena Katz 06/29/2017, 9:49 AM

## 2017-07-09 DIAGNOSIS — N4889 Other specified disorders of penis: Secondary | ICD-10-CM | POA: Diagnosis not present

## 2017-07-10 ENCOUNTER — Other Ambulatory Visit: Payer: Self-pay

## 2017-07-10 ENCOUNTER — Encounter (HOSPITAL_COMMUNITY): Payer: Self-pay | Admitting: Internal Medicine

## 2017-07-10 ENCOUNTER — Inpatient Hospital Stay (HOSPITAL_COMMUNITY)
Admission: EM | Admit: 2017-07-10 | Discharge: 2017-07-23 | DRG: 840 | Disposition: A | Discharge status: Hospice | Payer: Medicare Other | Attending: Internal Medicine | Admitting: Internal Medicine

## 2017-07-10 ENCOUNTER — Emergency Department (HOSPITAL_COMMUNITY): Payer: Medicare Other

## 2017-07-10 DIAGNOSIS — Z515 Encounter for palliative care: Secondary | ICD-10-CM | POA: Diagnosis not present

## 2017-07-10 DIAGNOSIS — E785 Hyperlipidemia, unspecified: Secondary | ICD-10-CM | POA: Diagnosis present

## 2017-07-10 DIAGNOSIS — Z66 Do not resuscitate: Secondary | ICD-10-CM | POA: Diagnosis not present

## 2017-07-10 DIAGNOSIS — I9589 Other hypotension: Secondary | ICD-10-CM | POA: Diagnosis present

## 2017-07-10 DIAGNOSIS — M545 Low back pain: Secondary | ICD-10-CM | POA: Diagnosis not present

## 2017-07-10 DIAGNOSIS — D594 Other nonautoimmune hemolytic anemias: Secondary | ICD-10-CM | POA: Diagnosis present

## 2017-07-10 DIAGNOSIS — I959 Hypotension, unspecified: Secondary | ICD-10-CM

## 2017-07-10 DIAGNOSIS — E43 Unspecified severe protein-calorie malnutrition: Secondary | ICD-10-CM | POA: Diagnosis present

## 2017-07-10 DIAGNOSIS — E118 Type 2 diabetes mellitus with unspecified complications: Secondary | ICD-10-CM | POA: Diagnosis not present

## 2017-07-10 DIAGNOSIS — Z978 Presence of other specified devices: Secondary | ICD-10-CM

## 2017-07-10 DIAGNOSIS — R0902 Hypoxemia: Secondary | ICD-10-CM | POA: Diagnosis not present

## 2017-07-10 DIAGNOSIS — J9601 Acute respiratory failure with hypoxia: Secondary | ICD-10-CM | POA: Diagnosis present

## 2017-07-10 DIAGNOSIS — G893 Neoplasm related pain (acute) (chronic): Secondary | ICD-10-CM | POA: Diagnosis not present

## 2017-07-10 DIAGNOSIS — Z79899 Other long term (current) drug therapy: Secondary | ICD-10-CM

## 2017-07-10 DIAGNOSIS — I491 Atrial premature depolarization: Secondary | ICD-10-CM | POA: Diagnosis not present

## 2017-07-10 DIAGNOSIS — F411 Generalized anxiety disorder: Secondary | ICD-10-CM

## 2017-07-10 DIAGNOSIS — L8990 Pressure ulcer of unspecified site, unspecified stage: Secondary | ICD-10-CM

## 2017-07-10 DIAGNOSIS — K449 Diaphragmatic hernia without obstruction or gangrene: Secondary | ICD-10-CM | POA: Diagnosis present

## 2017-07-10 DIAGNOSIS — K922 Gastrointestinal hemorrhage, unspecified: Secondary | ICD-10-CM | POA: Diagnosis not present

## 2017-07-10 DIAGNOSIS — G4733 Obstructive sleep apnea (adult) (pediatric): Secondary | ICD-10-CM | POA: Diagnosis present

## 2017-07-10 DIAGNOSIS — Y712 Prosthetic and other implants, materials and accessory cardiovascular devices associated with adverse incidents: Secondary | ICD-10-CM | POA: Diagnosis present

## 2017-07-10 DIAGNOSIS — R339 Retention of urine, unspecified: Secondary | ICD-10-CM | POA: Diagnosis present

## 2017-07-10 DIAGNOSIS — D649 Anemia, unspecified: Secondary | ICD-10-CM | POA: Diagnosis present

## 2017-07-10 DIAGNOSIS — Z7189 Other specified counseling: Secondary | ICD-10-CM | POA: Diagnosis not present

## 2017-07-10 DIAGNOSIS — T85520A Displacement of bile duct prosthesis, initial encounter: Secondary | ICD-10-CM | POA: Diagnosis not present

## 2017-07-10 DIAGNOSIS — I110 Hypertensive heart disease with heart failure: Secondary | ICD-10-CM | POA: Diagnosis present

## 2017-07-10 DIAGNOSIS — D509 Iron deficiency anemia, unspecified: Secondary | ICD-10-CM | POA: Diagnosis present

## 2017-07-10 DIAGNOSIS — K219 Gastro-esophageal reflux disease without esophagitis: Secondary | ICD-10-CM | POA: Diagnosis present

## 2017-07-10 DIAGNOSIS — D500 Iron deficiency anemia secondary to blood loss (chronic): Secondary | ICD-10-CM | POA: Diagnosis not present

## 2017-07-10 DIAGNOSIS — R338 Other retention of urine: Secondary | ICD-10-CM | POA: Diagnosis not present

## 2017-07-10 DIAGNOSIS — R64 Cachexia: Secondary | ICD-10-CM | POA: Diagnosis present

## 2017-07-10 DIAGNOSIS — R627 Adult failure to thrive: Secondary | ICD-10-CM | POA: Diagnosis present

## 2017-07-10 DIAGNOSIS — M549 Dorsalgia, unspecified: Secondary | ICD-10-CM | POA: Diagnosis not present

## 2017-07-10 DIAGNOSIS — N400 Enlarged prostate without lower urinary tract symptoms: Secondary | ICD-10-CM | POA: Diagnosis present

## 2017-07-10 DIAGNOSIS — T82223D Leakage of biological heart valve graft, subsequent encounter: Secondary | ICD-10-CM

## 2017-07-10 DIAGNOSIS — Z8249 Family history of ischemic heart disease and other diseases of the circulatory system: Secondary | ICD-10-CM

## 2017-07-10 DIAGNOSIS — R188 Other ascites: Secondary | ICD-10-CM | POA: Diagnosis not present

## 2017-07-10 DIAGNOSIS — Z7984 Long term (current) use of oral hypoglycemic drugs: Secondary | ICD-10-CM

## 2017-07-10 DIAGNOSIS — D6489 Other specified anemias: Secondary | ICD-10-CM | POA: Diagnosis not present

## 2017-07-10 DIAGNOSIS — Z6828 Body mass index (BMI) 28.0-28.9, adult: Secondary | ICD-10-CM

## 2017-07-10 DIAGNOSIS — R17 Unspecified jaundice: Secondary | ICD-10-CM

## 2017-07-10 DIAGNOSIS — K811 Chronic cholecystitis: Secondary | ICD-10-CM

## 2017-07-10 DIAGNOSIS — R0602 Shortness of breath: Secondary | ICD-10-CM | POA: Diagnosis not present

## 2017-07-10 DIAGNOSIS — T85598A Other mechanical complication of other gastrointestinal prosthetic devices, implants and grafts, initial encounter: Secondary | ICD-10-CM | POA: Diagnosis present

## 2017-07-10 DIAGNOSIS — C9000 Multiple myeloma not having achieved remission: Principal | ICD-10-CM | POA: Diagnosis present

## 2017-07-10 DIAGNOSIS — Z953 Presence of xenogenic heart valve: Secondary | ICD-10-CM

## 2017-07-10 DIAGNOSIS — I350 Nonrheumatic aortic (valve) stenosis: Secondary | ICD-10-CM | POA: Diagnosis not present

## 2017-07-10 DIAGNOSIS — I5033 Acute on chronic diastolic (congestive) heart failure: Secondary | ICD-10-CM | POA: Diagnosis present

## 2017-07-10 DIAGNOSIS — R52 Pain, unspecified: Secondary | ICD-10-CM

## 2017-07-10 DIAGNOSIS — Z87311 Personal history of (healed) other pathological fracture: Secondary | ICD-10-CM

## 2017-07-10 DIAGNOSIS — G8929 Other chronic pain: Secondary | ICD-10-CM | POA: Diagnosis present

## 2017-07-10 DIAGNOSIS — D62 Acute posthemorrhagic anemia: Secondary | ICD-10-CM | POA: Diagnosis present

## 2017-07-10 DIAGNOSIS — K5521 Angiodysplasia of colon with hemorrhage: Secondary | ICD-10-CM | POA: Diagnosis present

## 2017-07-10 DIAGNOSIS — Z885 Allergy status to narcotic agent status: Secondary | ICD-10-CM

## 2017-07-10 DIAGNOSIS — Z87891 Personal history of nicotine dependence: Secondary | ICD-10-CM

## 2017-07-10 DIAGNOSIS — Z7401 Bed confinement status: Secondary | ICD-10-CM | POA: Diagnosis not present

## 2017-07-10 DIAGNOSIS — E11649 Type 2 diabetes mellitus with hypoglycemia without coma: Secondary | ICD-10-CM | POA: Diagnosis not present

## 2017-07-10 DIAGNOSIS — M546 Pain in thoracic spine: Secondary | ICD-10-CM | POA: Diagnosis not present

## 2017-07-10 DIAGNOSIS — R601 Generalized edema: Secondary | ICD-10-CM | POA: Diagnosis not present

## 2017-07-10 DIAGNOSIS — L89151 Pressure ulcer of sacral region, stage 1: Secondary | ICD-10-CM | POA: Diagnosis present

## 2017-07-10 DIAGNOSIS — M2550 Pain in unspecified joint: Secondary | ICD-10-CM | POA: Diagnosis not present

## 2017-07-10 DIAGNOSIS — Y732 Prosthetic and other implants, materials and accessory gastroenterology and urology devices associated with adverse incidents: Secondary | ICD-10-CM | POA: Diagnosis present

## 2017-07-10 DIAGNOSIS — R58 Hemorrhage, not elsewhere classified: Secondary | ICD-10-CM | POA: Diagnosis not present

## 2017-07-10 DIAGNOSIS — D638 Anemia in other chronic diseases classified elsewhere: Secondary | ICD-10-CM | POA: Diagnosis not present

## 2017-07-10 DIAGNOSIS — R630 Anorexia: Secondary | ICD-10-CM | POA: Diagnosis not present

## 2017-07-10 DIAGNOSIS — Z888 Allergy status to other drugs, medicaments and biological substances status: Secondary | ICD-10-CM

## 2017-07-10 LAB — PROTIME-INR
INR: 1.12
PROTHROMBIN TIME: 14.3 s (ref 11.4–15.2)

## 2017-07-10 LAB — CBC WITH DIFFERENTIAL/PLATELET
BASOS ABS: 0 10*3/uL (ref 0.0–0.1)
Basophils Relative: 0 %
Eosinophils Absolute: 0 10*3/uL (ref 0.0–0.7)
Eosinophils Relative: 0 %
HEMATOCRIT: 24.6 % — AB (ref 39.0–52.0)
HEMOGLOBIN: 7.7 g/dL — AB (ref 13.0–17.0)
LYMPHS PCT: 4 %
Lymphs Abs: 0.3 10*3/uL — ABNORMAL LOW (ref 0.7–4.0)
MCH: 30.1 pg (ref 26.0–34.0)
MCHC: 31.3 g/dL (ref 30.0–36.0)
MCV: 96.1 fL (ref 78.0–100.0)
MONOS PCT: 7 %
Monocytes Absolute: 0.6 10*3/uL (ref 0.1–1.0)
Neutro Abs: 7.6 10*3/uL (ref 1.7–7.7)
Neutrophils Relative %: 89 %
Platelets: 117 10*3/uL — ABNORMAL LOW (ref 150–400)
RBC: 2.56 MIL/uL — AB (ref 4.22–5.81)
RDW: 25.3 % — ABNORMAL HIGH (ref 11.5–15.5)
WBC: 8.5 10*3/uL (ref 4.0–10.5)

## 2017-07-10 LAB — HEPATIC FUNCTION PANEL
ALBUMIN: 2.8 g/dL — AB (ref 3.5–5.0)
ALK PHOS: 134 U/L — AB (ref 38–126)
ALT: 11 U/L — ABNORMAL LOW (ref 17–63)
AST: 64 U/L — AB (ref 15–41)
BILIRUBIN TOTAL: 7.5 mg/dL — AB (ref 0.3–1.2)
Bilirubin, Direct: 3.3 mg/dL — ABNORMAL HIGH (ref 0.1–0.5)
Indirect Bilirubin: 4.2 mg/dL — ABNORMAL HIGH (ref 0.3–0.9)
Total Protein: 5.6 g/dL — ABNORMAL LOW (ref 6.5–8.1)

## 2017-07-10 LAB — BASIC METABOLIC PANEL
ANION GAP: 12 (ref 5–15)
BUN: 18 mg/dL (ref 6–20)
CHLORIDE: 90 mmol/L — AB (ref 101–111)
CO2: 28 mmol/L (ref 22–32)
Calcium: 8.4 mg/dL — ABNORMAL LOW (ref 8.9–10.3)
Creatinine, Ser: 0.58 mg/dL — ABNORMAL LOW (ref 0.61–1.24)
GFR calc non Af Amer: >60 mL/min (ref >60)
Glucose, Bld: 115 mg/dL — ABNORMAL HIGH (ref 65–99)
POTASSIUM: 3.1 mmol/L — AB (ref 3.5–5.1)
Sodium: 130 mmol/L — ABNORMAL LOW (ref 135–145)

## 2017-07-10 LAB — I-STAT TROPONIN, ED: TROPONIN I, POC: 0.08 ng/mL (ref 0.00–0.08)

## 2017-07-10 LAB — LIPASE, BLOOD: Lipase: 29 U/L (ref 11–51)

## 2017-07-10 LAB — BRAIN NATRIURETIC PEPTIDE: B Natriuretic Peptide: 2496.9 pg/mL — ABNORMAL HIGH (ref 0.0–100.0)

## 2017-07-10 LAB — PREPARE RBC (CROSSMATCH)

## 2017-07-10 LAB — I-STAT CG4 LACTIC ACID, ED: Lactic Acid, Venous: 1.61 mmol/L (ref 0.5–1.9)

## 2017-07-10 MED ORDER — ONDANSETRON HCL 4 MG/2ML IJ SOLN: 4.0000 mg | Freq: Four times a day (QID) | INTRAMUSCULAR | Status: DC | PRN

## 2017-07-10 MED ORDER — GABAPENTIN 800 MG PO TABS: 400.0000 mg | ORAL_TABLET | ORAL | Status: DC

## 2017-07-10 MED ORDER — ACETAMINOPHEN 325 MG PO TABS
650.0000 mg | ORAL_TABLET | Freq: Four times a day (QID) | ORAL | Status: DC | PRN
  Administered 2017-07-13 – 2017-07-21 (×5): 650 mg via ORAL
  Filled 2017-07-10 (×5): qty 2

## 2017-07-10 MED ORDER — SIMVASTATIN 10 MG PO TABS
10.0000 mg | ORAL_TABLET | Freq: Every day | ORAL | Status: DC
  Administered 2017-07-10 – 2017-07-16 (×7): 10 mg via ORAL
  Filled 2017-07-10 (×7): qty 1

## 2017-07-10 MED ORDER — ALUM & MAG HYDROXIDE-SIMETH 200-200-20 MG/5ML PO SUSP
30.0000 mL | ORAL | Status: DC | PRN
  Administered 2017-07-10 – 2017-07-19 (×9): 30 mL via ORAL
  Filled 2017-07-10 (×10): qty 30

## 2017-07-10 MED ORDER — MORPHINE SULFATE 15 MG PO TABS
7.5000 mg | ORAL_TABLET | ORAL | Status: DC | PRN
  Administered 2017-07-10 – 2017-07-11 (×3): 7.5 mg via ORAL
  Filled 2017-07-10 (×3): qty 1

## 2017-07-10 MED ORDER — FUROSEMIDE 10 MG/ML IJ SOLN
20.0000 mg | Freq: Two times a day (BID) | INTRAMUSCULAR | Status: DC
  Administered 2017-07-10 – 2017-07-15 (×9): 20 mg via INTRAVENOUS
  Filled 2017-07-10 (×10): qty 2

## 2017-07-10 MED ORDER — POLYVINYL ALCOHOL-POVIDONE PF 1.4-0.6 % OP SOLN: 1.0000 [drp] | Freq: Two times a day (BID) | OPHTHALMIC | Status: DC | PRN

## 2017-07-10 MED ORDER — DOCUSATE SODIUM 100 MG PO CAPS
100.0000 mg | ORAL_CAPSULE | Freq: Two times a day (BID) | ORAL | Status: DC | PRN
  Administered 2017-07-13 – 2017-07-14 (×2): 100 mg via ORAL
  Filled 2017-07-10 (×2): qty 1

## 2017-07-10 MED ORDER — FINASTERIDE 5 MG PO TABS
5.0000 mg | ORAL_TABLET | Freq: Every day | ORAL | Status: DC
  Administered 2017-07-11 – 2017-07-23 (×12): 5 mg via ORAL
  Filled 2017-07-10 (×12): qty 1

## 2017-07-10 MED ORDER — POLYETHYLENE GLYCOL 3350 17 G PO PACK
17.0000 g | PACK | Freq: Two times a day (BID) | ORAL | Status: DC
  Administered 2017-07-11 – 2017-07-16 (×5): 17 g via ORAL
  Filled 2017-07-10 (×12): qty 1

## 2017-07-10 MED ORDER — SODIUM CHLORIDE 0.9 % IV BOLUS
1000.0000 mL | Freq: Once | INTRAVENOUS | Status: DC
  Administered 2017-07-10: 1000 mL via INTRAVENOUS

## 2017-07-10 MED ORDER — OMEGA-3-ACID ETHYL ESTERS 1 G PO CAPS
1.0000 g | ORAL_CAPSULE | Freq: Every day | ORAL | Status: DC
  Administered 2017-07-11 – 2017-07-17 (×6): 1 g via ORAL
  Filled 2017-07-10 (×6): qty 1

## 2017-07-10 MED ORDER — TRAZODONE HCL 50 MG PO TABS
50.0000 mg | ORAL_TABLET | Freq: Every day | ORAL | Status: DC
  Administered 2017-07-10 – 2017-07-22 (×11): 50 mg via ORAL
  Filled 2017-07-10 (×13): qty 1

## 2017-07-10 MED ORDER — ONDANSETRON HCL 4 MG PO TABS: 4.0000 mg | ORAL_TABLET | Freq: Four times a day (QID) | ORAL | Status: DC | PRN

## 2017-07-10 MED ORDER — ALBUMIN HUMAN 5 % IV SOLN
12.5000 g | Freq: Once | INTRAVENOUS | Status: DC
  Filled 2017-07-10: qty 250

## 2017-07-10 MED ORDER — PANTOPRAZOLE SODIUM 40 MG PO TBEC
40.0000 mg | DELAYED_RELEASE_TABLET | Freq: Two times a day (BID) | ORAL | Status: DC
  Administered 2017-07-11 – 2017-07-23 (×22): 40 mg via ORAL
  Filled 2017-07-10 (×24): qty 1

## 2017-07-10 MED ORDER — FOLIC ACID 1 MG PO TABS
1.0000 mg | ORAL_TABLET | Freq: Every day | ORAL | Status: DC
  Administered 2017-07-10 – 2017-07-17 (×8): 1 mg via ORAL
  Filled 2017-07-10 (×8): qty 1

## 2017-07-10 MED ORDER — ACETAMINOPHEN 325 MG PO TABS: 650.0000 mg | ORAL_TABLET | Freq: Four times a day (QID) | ORAL | Status: DC | PRN

## 2017-07-10 MED ORDER — SODIUM CHLORIDE 0.9 % IV SOLN
Freq: Once | INTRAVENOUS | Status: AC
  Administered 2017-07-10: 13:00:00 via INTRAVENOUS

## 2017-07-10 MED ORDER — ACETAMINOPHEN 650 MG RE SUPP: 650.0000 mg | Freq: Four times a day (QID) | RECTAL | Status: DC | PRN

## 2017-07-10 MED ORDER — CARBIDOPA-LEVODOPA 25-100 MG PO TABS
1.0000 | ORAL_TABLET | Freq: Three times a day (TID) | ORAL | Status: DC
  Administered 2017-07-10 – 2017-07-23 (×35): 1 via ORAL
  Filled 2017-07-10 (×36): qty 1

## 2017-07-10 MED ORDER — CYANOCOBALAMIN 500 MCG PO TABS
500.0000 ug | ORAL_TABLET | Freq: Two times a day (BID) | ORAL | Status: DC
  Administered 2017-07-11 – 2017-07-17 (×11): 500 ug via ORAL
  Filled 2017-07-10 (×15): qty 1

## 2017-07-10 MED ORDER — TRAMADOL HCL 50 MG PO TABS
50.0000 mg | ORAL_TABLET | Freq: Four times a day (QID) | ORAL | Status: DC | PRN
  Administered 2017-07-11: 50 mg via ORAL
  Filled 2017-07-10: qty 1

## 2017-07-10 MED ORDER — POTASSIUM CHLORIDE CRYS ER 20 MEQ PO TBCR
30.0000 meq | EXTENDED_RELEASE_TABLET | Freq: Once | ORAL | Status: AC
  Administered 2017-07-10: 30 meq via ORAL
  Filled 2017-07-10: qty 1

## 2017-07-10 MED ORDER — POLYVINYL ALCOHOL 1.4 % OP SOLN
1.0000 [drp] | Freq: Two times a day (BID) | OPHTHALMIC | Status: DC | PRN
  Filled 2017-07-10: qty 15

## 2017-07-10 NOTE — ED Notes (Signed)
This RN attempted to access patient's port. Patient stated that he needed to go to the restroom. Multiple attempts made to let patient void using the urinal laying down in bed due to hypotension. This RN explained to the patient reasoning behind wanting him to lay down and not stand up d/t blood pressure concerns. Patient refused to void laying down. [place], Tech called for assistance to help patient stand up to be able to void. Patient unable to void standing up as well. Will notify physician.

## 2017-07-10 NOTE — ED Triage Notes (Addendum)
Pt here via [facility] from home with shortness of breath and episodes of tachycardia. Patient appears jaundiced and wife reports increased generalized edema. Heart rate 82, RR 16, 96% RA. Pt is not in distress at this time.

## 2017-07-10 NOTE — ED Provider Notes (Signed)
[facility] EMERGENCY DEPARTMENT Provider Note   CSN: [id number] Arrival date & time: 07/10/17  1141     History   Chief Complaint Chief Complaint  Patient presents with  . Edema  . Shortness of Breath    HPI [name] is a 72 y.o. male who presents for racing heart and SOB. 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 He recieves 2 units of PRBcs  Every Tuesday and Friday at [facility].  Patient was supposed to visit the [place] hospital today bet had several episodes of racing heart and felt short of breath so asked to come to the hospital for evaluation today.  Patient is noted to be jaundiced and icteric.  He states that he fell and was seen at [facility] about 2 weeks ago and since then has noticed his drain has had poor output.  He is also noted swelling which is significantly worse in his abdomen and lower extremities.  He feels somewhat short of breath.  He denies fevers, chills or chest pain.  HPI  Past Medical History:  Diagnosis Date  . Allergic rhinitis   . Anemia 2009  . Aortic stenosis    a. s/p porcine AVR 2006;  b. s/p redo tissue AVR 12/2011 (Dr. Roxy Manns) - preAVR LHC with no CAD  . Asthma   . BPH (benign prostatic hypertrophy)   . Cancer ([place])    multiple myeloma  . Cataract   . Chronic cough   . Chronic interstitial cystitis   . Depression    pt denies  . Diabetes mellitus    type 2  . Diabetes mellitus without complication ([place])   . Diverticulosis of colon    on colonoscopy 2008  . GERD (gastroesophageal reflux disease)    bravo pH study 2008  . Gout   . H/O aortic valve replacement with porcine valve    2006, 2013  . Headache(784.0)   . Hemorrhoids    external and internal  . Hiatal hernia   . HTN (hypertension)   . Hx of  echocardiogram    a. Echo  (post AVR) 12/2011:  mod LVH, EF 60-65%, Gr 2 diast dysfn, mild AI, AVR ok (mean gradient 19 mmHg), MAC, mild BAE  . Hyperlipidemia   . Hypertension   . Hyperthyroidism   . IBS (irritable bowel syndrome)   . Insomnia   . Lower GI bleed 07/16/2015  . Multiple myeloma ([place])   . OA (osteoarthritis)   . OSA (obstructive sleep apnea)    USES CPAP AS NEEDED  . 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 Periodontitis    chronic with bone loss  . Restless leg syndrome   . S/P aortic valve replacement with bioprosthetic valve 12/06/2011   Redo AVR using 23 mm [facility] Ease pericardial tissue valve    Patient Active Problem List   Diagnosis Date Noted  . Thrombocytopenia ([place])   . Protein-calorie malnutrition, severe 06/25/2017  . Goals of care, counseling/discussion   . Palliative care by specialist   . Demand ischemia ([place]) 06/23/2017  . Hyponatremia 06/23/2017  . Acute respiratory distress 11/19/2016  . Atrial flutter ([place]) 11/19/2016  . Anemia due to chronic blood loss   . Pulmonary edema 10/02/2016  . Dehydration   . Shortness of breath 09/24/2016  .  Dyspnea 08/20/2016  . Pancytopenia ([place]) 08/20/2016  . Type II diabetes mellitus with complication ([place]) 65/04/5463  . Hypoxia 08/20/2016  . CHF (congestive heart failure) ([place]) 08/20/2016  . Sepsis, unspecified organism ([place]) 03/23/2016  . Acute on chronic diastolic congestive heart failure ([place]) 03/23/2016  . Influenza A 03/23/2016  . Acute respiratory failure ([place]) 03/23/2016  . 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 Rectal bleeding   . Internal bleeding hemorrhoids   . Symptomatic anemia 07/16/2015  . GI bleed 07/16/2015  . Hemorrhoids 07/16/2015  . Multiple myeloma not having achieved remission ([place])   . Syncope and collapse   . Acute blood loss anemia 03/17/2015  . Bruit 01/19/2015  . S/P aortic valve replacement with bioprosthetic valve 12/06/2011  .  Aortic stenosis 11/30/2011  . Chronic daily headache 11/30/2011  . Iron deficiency anemia, unspecified 09/15/2011  . IBS (irritable bowel syndrome) 09/15/2011  . Syncope 08/29/2011  . Cough syncope 10/13/2010  . HYPERLIPIDEMIA-MIXED 07/02/2008  . Essential hypertension 07/02/2008  . GERD 07/02/2008  . OBSTRUCTIVE SLEEP APNEA 01/15/2007  . ALLERGIC  RHINITIS 01/15/2007  . INSOMNIA 01/15/2007  . OSTEOARTHRITIS 12/02/2006  . BPH (benign prostatic hyperplasia) 12/02/2006  . H/O aortic valve replacement 12/02/2006    Past Surgical History:  Procedure Laterality Date  . AORTA - FEMORAL ARTERY BYPASS GRAFT    . AORTIC VALVE REPLACEMENT  10/13/2004   72m [place] pericardial tissue valve  . AORTIC VALVE REPLACEMENT  12/06/2011   Procedure: REDO AORTIC VALVE REPLACEMENT (AVR);  Surgeon: CRexene Alberts MD;  [facility]  Service: Open Heart Surgery;  Laterality: N/A;  . BUNIONECTOMY     right  . CARDIAC SURGERY     aorta vavle replacement  . CATARACT EXTRACTION  2009    &   2012   BILATERAL  . COLONOSCOPY  08/31/2011   Procedure: COLONOSCOPY;  Surgeon: RInda Castle MD;  [facility]  Service: Endoscopy;  Laterality: N/A;  . ESOPHAGOGASTRODUODENOSCOPY  08/30/2011   Procedure: ESOPHAGOGASTRODUODENOSCOPY (EGD);  Surgeon: RInda Castle MD;  [facility] [facility]  Service: Endoscopy;  Laterality: N/A;  Rm 3005   . HERNIA REPAIR    . LEFT HEART CATHETERIZATION WITH CORONARY ANGIOGRAM N/A 11/30/2011   Procedure: LEFT HEART CATHETERIZATION WITH CORONARY ANGIOGRAM;  Surgeon: CBurnell Blanks MD;  [facility];  Service: Cardiovascular;  Laterality: N/A;  . NASAL SEPTOPLASTY W/ TURBINOPLASTY    . REFRACTIVE SURGERY     bilateral  . RIGHT HEART CATHETERIZATION Bilateral 11/30/2011   Procedure: RIGHT HEART CATH;  Surgeon: CBurnell Blanks MD;  [facility];  Service: Cardiovascular;  Laterality: Bilateral;  . right knee  arthroscopy    . TEE WITHOUT CARDIOVERSION N/A 10/27/2015   Procedure: TRANSESOPHAGEAL ECHOCARDIOGRAM (TEE);  Surgeon: BLelon Perla MD;  [facility];  Service: Cardiovascular;  Laterality: N/A;        Home Medications    Prior to Admission medications   Medication Sig Start Date End Date Taking? Authorizing Provider  acetaminophen (TYLENOL) 325 MG tablet Take 650 mg by mouth every 6 (six) hours as needed for fever.     [provider]  carbidopa-levodopa (SINEMET IR) 25-100 MG tablet Take 1 tablet by mouth 3 (three) times daily.     [provider]  cyanocobalamin 500 MCG tablet Take 500 mcg by mouth 2 (two) times daily. Lunch/dinner    [provider]  docusate sodium (COLACE) 100 MG capsule  Take 100 mg by mouth 2 (two) times daily as needed for moderate constipation.  08/20/15   [provider]  finasteride (PROSCAR) 5 MG tablet Take 5 mg by mouth daily.    [provider]  fish oil-omega-3 fatty acids 1000 MG capsule Take 1 g by mouth daily with lunch.     [provider]  folic acid (FOLVITE) 1 MG tablet Take 1 mg by mouth daily. 09/24/15   [provider]  furosemide (LASIX) 40 MG tablet TAKE 2 TABLETS (80 MG TOTAL) BY MOUTH DAILY. TAKE A EXTRA TABLET ON DAYS OF DIALYSIS Patient taking differently: Take 60 mg by mouth 2 (two) times daily.  05/15/17   Almyra Deforest, PA  gabapentin (NEURONTIN) 800 MG tablet Take 400-800 mg by mouth See admin instructions. Take 1/2 tablet every morning and at bedtime then take 1 tablet at lunch and dinner    [provider]  metFORMIN (GLUCOPHAGE) 500 MG tablet Take 500 mg by mouth 2 (two) times daily with a meal.    [provider]  morphine (MSIR) 15 MG tablet Take 7.5 mg by mouth every 4 (four) hours as needed for moderate pain.     [provider]  ondansetron (ZOFRAN) 4 MG tablet Take 4 mg by mouth every 8 (eight) hours as needed for nausea or vomiting.     [provider]  pantoprazole (PROTONIX) 40 MG tablet Take 1 tablet (40 mg total) by mouth 2 (two) times daily. For GERD 06/29/17   Regalado, Belkys A, MD  polyethylene glycol (MIRALAX / GLYCOLAX) packet Take 17 g by mouth 2 (two) times daily. 06/29/17   Regalado, Belkys A, MD  polyvinyl alcohol (LIQUIFILM TEARS) 1.4 % ophthalmic solution Place 1 drop into both eyes 2 (two) times daily as needed for dry eyes. 06/29/17   Regalado, Belkys A, MD  Polyvinyl Alcohol-Povidone PF (REFRESH) 1.4-0.6 % SOLN Place 1 drop into both eyes 2 (two) times daily as needed (dry eyes).    [provider]  simvastatin (ZOCOR) 10 MG tablet Take 10 mg by mouth at bedtime.  09/24/15   [provider]  traMADol (ULTRAM) 50 MG tablet Take 50 mg by mouth every 6 (six) hours as needed for moderate pain.    [provider]  traZODone (DESYREL) 50 MG tablet Take 50 mg by mouth at bedtime.    [provider]  UNABLE TO FIND Place 1 application into the right eye daily as needed (eye care). Ocular Ointment     [provider]  zinc oxide 20 % ointment Apply 1 application topically 2 (two) times daily as needed for irritation.    [provider]    Family History Family History  Problem Relation Age of Onset  . Heart disease Father   . Osteoarthritis Mother   . Hypertension Sister   . Hyperlipidemia Unknown   . Cancer Maternal Aunt     Social History Social History   Tobacco Use  . Smoking status: Former Smoker    Last attempt to quit: 09/30/1971    Years since quitting: 45.8  . Smokeless tobacco: Never Used  . Tobacco comment: Quit August 973  Substance Use Topics  . Alcohol use: No  . Drug use: No     Allergies   Avodart [dutasteride]; Spironolactone; Codeine; Doxazosin; and Feraheme [ferumoxytol]   Review of Systems Review of Systems   Physical Exam Updated Vital Signs BP (!) 81/60 (BP Location: Right Arm)   Pulse  84   Temp 98.1 F (36.7  C) (Oral)   Resp 16   SpO2 97%   Physical Exam   ED Treatments / Results  Labs (all labs ordered are listed, but only abnormal results are displayed) Labs Reviewed - No data to display  EKG None  Radiology No results found.  Procedures .Critical Care Performed by: Margarita Mail, PA-C Authorized by: Margarita Mail, PA-C   Critical care provider statement:    Critical care time (minutes):  60   Critical care was necessary to treat or prevent imminent or life-threatening deterioration of the following conditions:  Metabolic crisis, hepatic failure and circulatory failure   Critical care was time spent personally by me on the following activities:  Review of old charts, re-evaluation of patient's condition, pulse oximetry, ordering and review of radiographic studies, ordering and review of laboratory studies, ordering and performing treatments and interventions, development of treatment plan with patient or surrogate, discussions with consultants, evaluation of patient's response to treatment, examination of patient, interpretation of cardiac output measurements and obtaining history from patient or surrogate   (including critical care time)  Medications Ordered in ED Medications - No data to display   Initial Impression / Assessment and Plan / ED Course  I have reviewed the triage vital signs and the nursing notes.  Pertinent labs & imaging results that were available during my care of the patient were reviewed by me and considered in my medical decision making (see chart for details).  Clinical Course as of Jul 11 1651  Mon Jul 10, 2017  1424 Patient with acute urinary retention >941m,. Asymptomatic. Foley Cath in place.   [AH]    Clinical Course User Index [AH] HMargarita Mail PA-C    Patient with symptomatic anemia, elevated total bilirubin doubling over the past 2 weeks, mixed anemia with schistocytes on the smear suggestive of mechanical hemolysis, and both  elevated conjugated and unconjugated bilirubinemia.  Patient is persistently hypotensive and I believe this is very likely secondary to third spacing of the fluids due to hypoalbuminemia.  The patient will need further evaluation of his PERC drain to make sure it is emptying properly.  Patient will be admitted to stepdown by the hospitalist service.  I have ordered 2 units of red blood cells for transfusion.  Dr. JBroadus Johnwill admit the post patient.  Final Clinical Impressions(s) / ED Diagnoses   Final diagnoses:  None    ED Discharge Orders    None       HMargarita Mail PA-C 07/10/17 1656    LCarmin Muskrat MD 07/17/17 0021

## 2017-07-10 NOTE — H&P (Signed)
History and Physical    [name] ZOX:[id number] DOB: Aug 14, 1945 DOA: 07/10/2017  Referring MD/NP/PA: EDP PCP:  Patient coming from: Home  Chief Complaint: weakness and shortness of breath  HPI: [name] is a 72 y.o. 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 on 5/23 I.e. 10 days ago, he received 4 units of PRBC, was seen by palliative medicine, discharged back home,  after which he received 1 unit of blood at the cancer center on Wednesday and 2 units subsequently on Friday. -Patient reports progressive weakness for past few days, increase generalized swelling involving his lower extremities abdominal wall for 10-12 days. -his chronically had dark stools for a number of years and denies any change in this. -wife reports that he is increasingly weak, with limited mobility and mostly confined to a recliner or bed   ED Course: in the emergency room he was found to be hypotensive with blood pressures in the mid 80s, hemoglobin down to 7.7 from 8 range recently, bilirubin up to 7.5, mostly indirect bilirubin  Review of Systems: As per HPI otherwise 14 point review of systems negative.   Past Medical History:  Diagnosis Date  . Allergic rhinitis   . Anemia 2009  . Aortic stenosis    a. s/p porcine AVR 2006;  b. s/p redo tissue AVR 12/2011 (Dr. Roxy Manns) - preAVR LHC with no CAD  . Asthma   . BPH (benign prostatic hypertrophy)   . Cancer ([place])    multiple myeloma  . Cataract   .  Chronic cough   . Chronic interstitial cystitis   . Depression    pt denies  . Diabetes mellitus    type 2  . Diabetes mellitus without complication ([place])   . Diverticulosis of colon    on colonoscopy 2008  . GERD (gastroesophageal reflux disease)    bravo pH study 2008  . Gout   . H/O aortic valve replacement with porcine valve    2006, 2013  . Headache(784.0)   . Hemorrhoids    external and internal  . Hiatal hernia   . HTN (hypertension)   . Hx of echocardiogram    a. Echo  (post AVR) 12/2011:  mod LVH, EF 60-65%, Gr 2 diast dysfn, mild AI, AVR ok (mean gradient 19 mmHg), MAC, mild BAE  . Hyperlipidemia   . Hypertension   . Hyperthyroidism   . IBS (irritable bowel syndrome)   . Insomnia   . Lower GI bleed 07/16/2015  . Multiple myeloma ([place])   . OA (osteoarthritis)   . OSA (obstructive sleep apnea)    USES CPAP AS NEEDED  . 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 Periodontitis    chronic with bone loss  . Restless leg syndrome   . S/P aortic valve replacement with bioprosthetic valve 12/06/2011   Redo AVR using 23 mm [facility] Ease pericardial tissue valve    Past Surgical History:  Procedure Laterality Date  . AORTA - FEMORAL ARTERY BYPASS GRAFT    . AORTIC VALVE REPLACEMENT  10/13/2004   8m [facility] [place] pericardial tissue valve  . AORTIC VALVE REPLACEMENT  12/06/2011   Procedure: REDO AORTIC VALVE REPLACEMENT (AVR);  Surgeon: CRexene Alberts MD;  [place]  Service: Open Heart Surgery;  Laterality: N/A;  . BUNIONECTOMY     right  . CARDIAC SURGERY     aorta vavle replacement  . CATARACT EXTRACTION  2009    &   2012   BILATERAL  . COLONOSCOPY  08/31/2011   Procedure: COLONOSCOPY;  Surgeon: RInda Castle MD;  [place]  Service: Endoscopy;  Laterality: N/A;  . ESOPHAGOGASTRODUODENOSCOPY  08/30/2011   Procedure: ESOPHAGOGASTRODUODENOSCOPY (EGD);  Surgeon: RInda Castle MD;  [place]  Service:  Endoscopy;  Laterality: N/A;  Rm 3005   . HERNIA REPAIR    . LEFT HEART CATHETERIZATION WITH CORONARY ANGIOGRAM N/A 11/30/2011   Procedure: LEFT HEART CATHETERIZATION WITH CORONARY ANGIOGRAM;  Surgeon: CBurnell Blanks MD;  [facility];  Service: Cardiovascular;  Laterality: N/A;  . NASAL SEPTOPLASTY W/ TURBINOPLASTY    . REFRACTIVE SURGERY     bilateral  . RIGHT HEART CATHETERIZATION Bilateral 11/30/2011   Procedure: RIGHT HEART CATH;  Surgeon: CBurnell Blanks MD;  [facility];  Service: Cardiovascular;  Laterality: Bilateral;  . right knee arthroscopy    . TEE WITHOUT CARDIOVERSION N/A 10/27/2015   Procedure: TRANSESOPHAGEAL ECHOCARDIOGRAM (TEE);  Surgeon: BLelon Perla MD;  [facility];  Service: Cardiovascular;  Laterality: N/A;     reports that he quit smoking about 45 years ago. He has never used smokeless tobacco. He reports that he does not drink alcohol or use drugs.  Allergies  Allergen Reactions  . Avodart [Dutasteride] Other (See Comments)    Lose of use of arms and legs   . Spironolactone Other (See Comments)    Low tolerance   . Codeine Other (See Comments)    GI upset in large doses  . Doxazosin Other (See Comments)    Dizziness   . Feraheme [Ferumoxytol] Swelling    Pedal edema    Family History  Problem Relation Age of Onset  . Heart disease Father   . Osteoarthritis Mother   . Hypertension Sister   . Hyperlipidemia Unknown   . Cancer Maternal Aunt      Prior to Admission medications   Medication Sig Start Date End Date Taking? Authorizing Provider  acetaminophen (TYLENOL) 325 MG tablet Take 650 mg by mouth every 6 (six) hours as needed for fever.     [provider]  carbidopa-levodopa (SINEMET IR) 25-100 MG tablet Take 1 tablet by mouth 3 (three) times daily.     [provider]  cyanocobalamin 500 MCG tablet Take 500 mcg by mouth 2 (two) times daily. Lunch/dinner    [provider]  docusate sodium (COLACE) 100 MG capsule Take 100 mg by mouth 2 (two) times daily as needed for moderate constipation.  08/20/15   [provider]  finasteride (PROSCAR) 5 MG tablet Take 5 mg by mouth daily.    [provider]  fish oil-omega-3 fatty acids 1000 MG capsule Take 1 g by mouth daily with lunch.     [provider]  folic acid (FOLVITE) 1 MG tablet Take 1 mg by mouth daily. 09/24/15   [provider]  furosemide (LASIX) 40 MG tablet TAKE 2 TABLETS (80 MG TOTAL) BY MOUTH DAILY. TAKE A EXTRA TABLET ON DAYS OF DIALYSIS Patient taking differently:  Take 60 mg by mouth 2 (two) times daily.  05/15/17   Almyra Deforest, PA  gabapentin (NEURONTIN) 800 MG tablet Take 400-800 mg by mouth See admin instructions. Take 1/2 tablet every morning and at bedtime then take 1 tablet at lunch and dinner    [provider]  metFORMIN (GLUCOPHAGE) 500 MG tablet Take 500 mg by mouth 2 (two) times daily with a meal.    [provider]  morphine (MSIR) 15 MG tablet Take 7.5 mg by mouth every 4 (four) hours as needed for moderate pain.     [provider]  ondansetron (ZOFRAN) 4 MG tablet Take 4 mg by mouth every 8 (eight) hours as needed for nausea or vomiting.    [provider]  pantoprazole (PROTONIX) 40 MG tablet Take 1 tablet (40 mg total) by mouth 2 (two) times daily. For GERD 06/29/17   Regalado, Belkys A, MD  polyethylene glycol (MIRALAX / GLYCOLAX) packet Take 17 g by mouth 2 (two) times daily. 06/29/17   Regalado, Belkys A, MD  polyvinyl alcohol (LIQUIFILM TEARS) 1.4 % ophthalmic solution Place 1 drop into both eyes 2 (two) times daily as needed for dry eyes. 06/29/17   Regalado, Belkys A, MD  Polyvinyl Alcohol-Povidone PF (REFRESH) 1.4-0.6 % SOLN Place 1 drop into both eyes 2 (two) times daily as needed (dry eyes).    [provider]  simvastatin (ZOCOR) 10 MG tablet Take 10 mg by mouth at bedtime.  09/24/15   [provider]  traMADol (ULTRAM) 50 MG tablet Take 50 mg by mouth every 6 (six) hours as needed for moderate pain.    [provider]  traZODone (DESYREL) 50 MG tablet Take 50 mg by mouth at bedtime.    [provider]  UNABLE TO FIND Place 1 application into the right eye daily as needed (eye care). Ocular Ointment     [provider]  zinc oxide 20 % ointment Apply 1 application topically 2 (two) times daily as needed for irritation.    [provider]    Physical Exam: Vitals:   07/10/17 1600 07/10/17 1615 07/10/17 1617 07/10/17 1645  BP: (!) 88/56 (!) 91/53  (!) 98/55  Pulse: 84 83  81  Resp: 16   (!) 31  Temp:      TempSrc:      SpO2: 98% 96%  96%  Weight:   82.1 kg (181 lb)   Height:   5' 8.25" (1.734 m)        Vitals:   07/10/17 1600 07/10/17 1615 07/10/17 1617 07/10/17 1645  BP: (!) 88/56 (!) 91/53  (!) 98/55  Pulse: 84 83  81  Resp: 16   (!) 31  Temp:      TempSrc:      SpO2: 98% 96%  96%  Weight:   82.1 kg (181 lb)   Height:   5' 8.25" (1.734 m)    Gen: This is a very chronically ill-appearing frail icteric debilitated male sitting up in bed Eyes: positive scleral icterus and pallor ENMT: Mucous membranes are moist.  Neck: normal, supple Respiratory: good air movement, fine bibasilar crackles  Cardiovascular: S1-S2/regular rate rhythm, loud systolic ejection murmur Abdomen: soft, non tender, Bowel sounds positive.  Musculoskeletal: No joint deformity upper and lower extremities. Ext: 2-3+ edema, extending to upper thigh Skin: no rashes, lesions, ulcers.  Neurologic: CN 2-12 grossly intact. Sensation intact, DTR normal. Strength 5/5 in all 4.  Psychiatric: alert, awake, oriented and  mostly appropriate  Labs on Admission: I have personally reviewed following labs and imaging studies  CBC: Recent Labs  Lab 07/10/17 1156  WBC 8.5  NEUTROABS 7.6  HGB 7.7*  HCT 24.6*  MCV 96.1  PLT 979*   Basic Metabolic  Panel: Recent Labs  Lab 07/10/17 1156  NA 130*  K 3.1*  CL 90*  CO2 28  GLUCOSE 115*  BUN 18  CREATININE 0.58*  CALCIUM 8.4*   GFR: Estimated Creatinine Clearance: 83.9 mL/min (A) (by C-G formula based on SCr of 0.58 mg/dL (L)). Liver Function Tests: Recent Labs  Lab 07/10/17 1156  AST 64*  ALT 11*  ALKPHOS 134*  BILITOT 7.5*  PROT 5.6*  ALBUMIN 2.8*   Recent Labs  Lab 07/10/17 1156  LIPASE 29   No results for input(s): AMMONIA in the last 168 hours. Coagulation Profile: Recent Labs  Lab 07/10/17 1156  INR 1.12   Cardiac Enzymes: No results for input(s): CKTOTAL, CKMB, CKMBINDEX, TROPONINI in the last 168 hours. BNP (last 3 results) No results for input(s): PROBNP in the last 8760 hours. HbA1C: No results for input(s): HGBA1C in the last 72 hours. CBG: No results for input(s): GLUCAP in the last 168 hours. Lipid Profile: No results for input(s): CHOL, HDL, LDLCALC, TRIG, CHOLHDL, LDLDIRECT in the last 72 hours. Thyroid Function Tests: No results for input(s): TSH, T4TOTAL, FREET4, T3FREE, THYROIDAB in the last 72 hours. Anemia Panel: No results for input(s): VITAMINB12, FOLATE, FERRITIN, TIBC, IRON, RETICCTPCT in the last 72 hours. Urine analysis:    Component Value Date/Time   COLORURINE AMBER (A) 06/23/2017 0215   APPEARANCEUR HAZY (A) 06/23/2017 0215   LABSPEC 1.021 06/23/2017 0215   PHURINE 5.0 06/23/2017 0215   GLUCOSEU NEGATIVE 06/23/2017 0215   GLUCOSEU NEGATIVE 04/25/2012 1108   HGBUR MODERATE (A) 06/23/2017 0215   BILIRUBINUR NEGATIVE 06/23/2017 0215   KETONESUR 5 (A) 06/23/2017 0215   PROTEINUR NEGATIVE 06/23/2017 0215   UROBILINOGEN 0.2 05/17/2012 1021   NITRITE NEGATIVE 06/23/2017 0215   LEUKOCYTESUR NEGATIVE 06/23/2017 0215   Sepsis Labs: _0 (procalcitonin:4,lacticidven:4) )No results found for this or any previous visit (from the past 240 hour(s)).   Radiological Exams on Admission: Dg Chest Port 1 View  Result Date:  07/10/2017 CLINICAL DATA:  Shortness of breath and tachycardia EXAM: PORTABLE CHEST 1 VIEW COMPARISON:  06/23/2017 FINDINGS: Cardiac shadow remains enlarged. Postsurgical changes are again seen. Right chest wall port is again noted and stable. Persistent vascular congestion and mild interstitial edema is seen. Mild left basilar atelectasis is noted. No bony abnormality is seen. IMPRESSION: Changes of mild CHF. Electronically Signed   By: Inez Catalina M.D.   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, untreated multiple myeloma, hemolysis secondary to AVR/perivalvular leak -Significantly transfusion dependent at baseline and gets 4 units of blood twice a week at the cancer center -records reviewed, Not a candidate for surgical valve replacement -will transfuse 2 units of PRBC -Continue oral PPI, did not see need for IV Protonix, patient denies any changes in his chronic GI bleed, no intervention offered by GI at [facility] -Treatment will be supportive/conservative - when blood pressure improves will add low-dose IV Lasix with holding parameters  Severe aortic stenosis/ AVR with perivalvular leak -ongoing hemolysis with elevated indirect bilirubin and schistocytes,  -History of valve replacement followed by redo aVR in 2013 and long history of hemolysis  from perivalvular leak/AVR -Not a surgical candidate any longer -Followed by cardiology -very poor prognosis -increased lower extremity edema, almost generalized anasarca, Lasix as above as blood pressure tolerates  Multiple myeloma -followed by Dr. Tiana Loft at [facility] -Has been off all treatments since January 2 019, being managed supportively only  Acute on chronic diastolic CHF -volume overloaded, likely precipitated by anemia, worsening aortic stenosis, and some degree of third spacing from her  hypoalbuminemia -Low-dose IV Lasix when blood pressure improves posttransfusion -Baseline BP is in the 90-100 range per wife  Chronic cholecystitis -Cholecystostomy tube placed in [facility] in 02/2017 -This was exchanged back in April of 2019 -Per wife, drain has not been putting out anything for the last 3-4 days ever since he fell at the cancer center -Bilirubin elevated but most of this is indirect, AST ALT are closed to baseline -Will ask interventional radiology to assess drain patency and location  Type 2 diabetes mellitus -Hold metformin, sliding scale insulin  Goals of care discussion: -Extremely complex patient with very poor prognosis and multisystem issues -I talked to the patient and wife about poor prognosis and recommended DO NOT RESUSCITATE and further palliative care discussions, they are agreeable to no intubation/partial code at this time and reports that they need further conversations regarding this, appear to be in significant denial about complexity and seriousness of his illnesses  DVT prophylaxis: SCDs Code Status: Partial Code-DO not Intubate, yes to everything else for now, agree to palliative care discussions  Family Communication: wife at bedside Disposition Plan: inpatient Consults called: Palliative Medicine  Admission status: Inpatient   Domenic Polite MD [facility] Pager [phone]  If 7PM-7AM, please contact night-coverage [email or web address] Password [email or web address]  07/10/2017, 5:04 PM

## 2017-07-11 ENCOUNTER — Inpatient Hospital Stay (HOSPITAL_COMMUNITY): Payer: Medicare Other

## 2017-07-11 ENCOUNTER — Encounter (HOSPITAL_COMMUNITY): Payer: Self-pay

## 2017-07-11 DIAGNOSIS — R338 Other retention of urine: Secondary | ICD-10-CM

## 2017-07-11 DIAGNOSIS — Z515 Encounter for palliative care: Secondary | ICD-10-CM

## 2017-07-11 DIAGNOSIS — D649 Anemia, unspecified: Secondary | ICD-10-CM

## 2017-07-11 DIAGNOSIS — I959 Hypotension, unspecified: Secondary | ICD-10-CM

## 2017-07-11 DIAGNOSIS — K811 Chronic cholecystitis: Secondary | ICD-10-CM

## 2017-07-11 DIAGNOSIS — R17 Unspecified jaundice: Secondary | ICD-10-CM

## 2017-07-11 HISTORY — PX: IR EXCHANGE BILIARY DRAIN: IMG6046

## 2017-07-11 LAB — TYPE AND SCREEN
ABO/RH(D): A NEG
Antibody Screen: NEGATIVE
Unit division: 0
Unit division: 0

## 2017-07-11 LAB — COMPREHENSIVE METABOLIC PANEL
ALBUMIN: 2.6 g/dL — AB (ref 3.5–5.0)
ALK PHOS: 132 U/L — AB (ref 38–126)
ALT: 8 U/L — ABNORMAL LOW (ref 17–63)
ANION GAP: 9 (ref 5–15)
AST: 58 U/L — AB (ref 15–41)
BILIRUBIN TOTAL: 9.4 mg/dL — AB (ref 0.3–1.2)
BUN: 14 mg/dL (ref 6–20)
CALCIUM: 8.4 mg/dL — AB (ref 8.9–10.3)
CO2: 28 mmol/L (ref 22–32)
Chloride: 92 mmol/L — ABNORMAL LOW (ref 101–111)
Creatinine, Ser: 0.59 mg/dL — ABNORMAL LOW (ref 0.61–1.24)
GFR calc Af Amer: >60 mL/min (ref >60)
GFR calc non Af Amer: >60 mL/min (ref >60)
GLUCOSE: 103 mg/dL — AB (ref 65–99)
Potassium: 3.3 mmol/L — ABNORMAL LOW (ref 3.5–5.1)
SODIUM: 129 mmol/L — AB (ref 135–145)
TOTAL PROTEIN: 5.3 g/dL — AB (ref 6.5–8.1)

## 2017-07-11 LAB — BPAM RBC
BLOOD PRODUCT EXPIRATION DATE: 201906112359
Blood Product Expiration Date: 201906112359
ISSUE DATE / TIME: 201906031639
ISSUE DATE / TIME: 201906031639
UNIT TYPE AND RH: 600
Unit Type and Rh: 600

## 2017-07-11 LAB — CBC
HEMATOCRIT: 27.1 % — AB (ref 39.0–52.0)
HEMOGLOBIN: 8.6 g/dL — AB (ref 13.0–17.0)
MCH: 29.2 pg (ref 26.0–34.0)
MCHC: 31.7 g/dL (ref 30.0–36.0)
MCV: 91.9 fL (ref 78.0–100.0)
Platelets: 95 10*3/uL — ABNORMAL LOW (ref 150–400)
RBC: 2.95 MIL/uL — ABNORMAL LOW (ref 4.22–5.81)
RDW: 30.1 % — ABNORMAL HIGH (ref 11.5–15.5)
WBC: 7.6 10*3/uL (ref 4.0–10.5)

## 2017-07-11 MED ORDER — IOPAMIDOL (ISOVUE-300) INJECTION 61%
INTRAVENOUS | Status: AC
  Filled 2017-07-11: qty 50

## 2017-07-11 MED ORDER — ENSURE ENLIVE PO LIQD
237.0000 mL | Freq: Two times a day (BID) | ORAL | Status: DC
  Administered 2017-07-11 (×2): 237 mL via ORAL

## 2017-07-11 MED ORDER — SIMETHICONE 40 MG/0.6ML PO SUSP
80.0000 mg | Freq: Four times a day (QID) | ORAL | Status: DC | PRN
  Administered 2017-07-13: 80 mg via ORAL
  Administered 2017-07-14: 40 mg via ORAL
  Filled 2017-07-11 (×5): qty 1.2

## 2017-07-11 MED ORDER — ENSURE ENLIVE PO LIQD
237.0000 mL | Freq: Three times a day (TID) | ORAL | Status: DC
  Administered 2017-07-11 – 2017-07-22 (×25): 237 mL via ORAL

## 2017-07-11 MED ORDER — FENTANYL CITRATE (PF) 100 MCG/2ML IJ SOLN
12.5000 ug | INTRAMUSCULAR | Status: DC | PRN
Start: 2017-07-11 — End: 2017-07-13

## 2017-07-11 MED ORDER — LIDOCAINE HCL 1 % IJ SOLN
INTRAMUSCULAR | Status: AC
  Filled 2017-07-11: qty 20

## 2017-07-11 MED ORDER — OXYCODONE HCL 5 MG PO TABS
5.0000 mg | ORAL_TABLET | ORAL | Status: DC | PRN
  Administered 2017-07-11: 10 mg via ORAL
  Administered 2017-07-12 (×3): 5 mg via ORAL
  Administered 2017-07-13 (×2): 10 mg via ORAL
  Administered 2017-07-13: 5 mg via ORAL
  Administered 2017-07-14 (×3): 10 mg via ORAL
  Filled 2017-07-11 (×4): qty 2
  Filled 2017-07-11 (×2): qty 1
  Filled 2017-07-11: qty 2
  Filled 2017-07-11: qty 1
  Filled 2017-07-11 (×2): qty 2

## 2017-07-11 MED ORDER — LIDOCAINE HCL (PF) 1 % IJ SOLN
INTRAMUSCULAR | Status: DC | PRN
  Administered 2017-07-11: 5 mL

## 2017-07-11 MED ORDER — SODIUM CHLORIDE 0.9% FLUSH
5.0000 mL | Freq: Three times a day (TID) | INTRAVENOUS | Status: DC
  Administered 2017-07-11 – 2017-07-22 (×28): 5 mL

## 2017-07-11 MED ORDER — POTASSIUM CHLORIDE CRYS ER 20 MEQ PO TBCR
20.0000 meq | EXTENDED_RELEASE_TABLET | Freq: Once | ORAL | Status: DC
  Filled 2017-07-11 (×3): qty 1

## 2017-07-11 MED ORDER — DICLOFENAC SODIUM 1 % TD GEL
2.0000 g | Freq: Four times a day (QID) | TRANSDERMAL | Status: DC | PRN
  Administered 2017-07-11 – 2017-07-16 (×12): 2 g via TOPICAL
  Filled 2017-07-11 (×2): qty 100

## 2017-07-11 NOTE — Progress Notes (Signed)
Daily Progress Note   Patient Name: [name]       Date: 07/11/2017 DOB: 1945-05-26  Age: 72 y.o. MRN#: [id number] Attending Physician: Cherene Altes, MD Primary Care Physician: [facility] Admit Date: 07/10/2017  Reason for Consultation/Follow-up: Establishing goals of care  Subjective: [name] is resting quietly in bed.  He greets me making and keeping eye contact.  Present today at bedside is his wife of 70 years, Fraser Din.  We talked about his daily life.  [name] has been married to his wife for approximately 16 years.  They have no children, but have an elderly cat at home.  They had planned to attend the [employer] convention in July, but [name] endorses that he does not believe he will have the strength to attend. We talked about his chronic health problems and treatment plan in detail.  We talked about finding balance, and the effort for him to make it to transfusion appointments.  They share that treatment for multiple myeloma stopped in January with his gallbladder problems, but, his multiple myeloma has remained "stable". We talked about CODE STATUS including "treat the treatable".  I encourage [name] to consider how we will care for him in the future.  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  I share that when people get tired of fighting, they are not quitters.  Mrs. Byington states that Mr. Matsumoto has goals and things to look forward to, as she saying this he is shaking his head no.  I share with her that he is shaking has not had no to the statement.  He talks about his effort in completing self-care and medical visits. Mrs. Rendleman asks about the MOST form.  I bring the most form to the room and we reviewed the basics.  We plan for family meeting 6-5 at  65 AM to complete these choices.  [name] states that he wants to be the one to sign the form, not his wife.  Conference with nursing staff related to today's meeting. Conference with Dr. Thereasa Solo on next rounds.  Length of Stay: 1  Current Medications: Scheduled Meds:  . carbidopa-levodopa  1 tablet Oral TID  . feeding supplement (ENSURE ENLIVE)  237 mL Oral BID BM  . finasteride  5 mg Oral Daily  .  folic acid  1 mg Oral Daily  . furosemide  20 mg Intravenous Q12H  . omega-3 acid ethyl esters  1 g Oral Q lunch  . pantoprazole  40 mg Oral BID  . polyethylene glycol  17 g Oral BID  . potassium chloride  20 mEq Oral Once  . simvastatin  10 mg Oral QHS  . traZODone  50 mg Oral QHS  . vitamin B-12  500 mcg Oral BID AC    Continuous Infusions:   PRN Meds: acetaminophen **OR** acetaminophen, alum & mag hydroxide-simeth, docusate sodium, morphine, ondansetron **OR** ondansetron (ZOFRAN) IV, polyvinyl alcohol, traMADol  Physical Exam  Constitutional: He is oriented to person, place, and time. He appears ill.  Appears acutely/chronically ill, makes and keeps eye contact  HENT:  Head: Atraumatic.  Some temporal wasting  Cardiovascular: Normal rate.  Pulmonary/Chest: Effort normal. No respiratory distress.  Abdominal: Soft. He exhibits distension. There is no guarding.  Musculoskeletal:       Right lower leg: He exhibits edema.       Left lower leg: He exhibits edema.  Neurological: He is alert and oriented to person, place, and time.  Skin: Skin is warm and dry. There is pallor.  Psychiatric:  Calm and cooperative, not fearful  Nursing note and vitals reviewed.           Vital Signs: BP 118/61 (BP Location: Left Arm)   Pulse 82   Temp 98.3 F (36.8 C) (Oral)   Resp 14   Ht _0  (1.753 m)   Wt 87.2 kg (192 lb 4.8 oz)   SpO2 92%   BMI 28.40 kg/m  SpO2: SpO2: 92 % O2 Device: O2 Device: Room Air O2 Flow Rate:    Intake/output summary:   Intake/Output Summary (Last  24 hours) at 07/11/2017 1319 Last data filed at 07/11/2017 1114 Gross per 24 hour  Intake 4036.4 ml  Output 400 ml  Net 3636.4 ml   LBM: Last BM Date: 07/08/17 Baseline Weight: Weight: 82.1 kg (181 lb) Most recent weight: Weight: 87.2 kg (192 lb 4.8 oz)       Palliative Assessment/Data:      Patient Active Problem List   Diagnosis Date Noted  . Anemia 07/10/2017  . Thrombocytopenia ([place])   . Protein-calorie malnutrition, severe 06/25/2017  . Goals of care, counseling/discussion   . Palliative care by specialist   . Demand ischemia ([place]) 06/23/2017  . Hyponatremia 06/23/2017  . Acute respiratory distress 11/19/2016  . Atrial flutter ([place]) 11/19/2016  . Anemia due to chronic blood loss   . Pulmonary edema 10/02/2016  . Dehydration   . Shortness of breath 09/24/2016  . Dyspnea 08/20/2016  . Pancytopenia ([place]) 08/20/2016  . Type II diabetes mellitus with complication ([place]) 62/95/2841  . Hypoxia 08/20/2016  . CHF (congestive heart failure) ([place]) 08/20/2016  . Sepsis, unspecified organism ([place]) 03/23/2016  . Acute on chronic diastolic congestive heart failure ([place]) 03/23/2016  . Influenza A 03/23/2016  . Acute respiratory failure ([place]) 03/23/2016  . 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 Rectal bleeding   . Internal bleeding hemorrhoids   . Symptomatic anemia 07/16/2015  . GI bleed 07/16/2015  . Hemorrhoids 07/16/2015  . Multiple myeloma not having achieved remission ([place])   . Syncope and collapse   . Acute blood loss anemia 03/17/2015  . Bruit 01/19/2015  . S/P aortic valve replacement with bioprosthetic valve 12/06/2011  . Aortic stenosis 11/30/2011  . Chronic daily headache 11/30/2011  .  Iron deficiency anemia, unspecified 09/15/2011  . IBS (irritable bowel syndrome) 09/15/2011  . Syncope 08/29/2011  . Cough syncope 10/13/2010  . HYPERLIPIDEMIA-MIXED 07/02/2008  . Essential hypertension 07/02/2008  . GERD 07/02/2008  . OBSTRUCTIVE SLEEP APNEA  01/15/2007  . ALLERGIC  RHINITIS 01/15/2007  . INSOMNIA 01/15/2007  . OSTEOARTHRITIS 12/02/2006  . BPH (benign prostatic hyperplasia) 12/02/2006  . 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  Assessment: Perc chole drain: To be evaluated possibly replaced by IR today, injection and exchange cath if needed.  Transfusion dependent anemia; [name] states he will continue to go to [facility] weekly for transfusion but this will now be split with the [place] in [place].  He will have transfusion once per week at [facility], once per week at the [place].  We talked about the effort for [name] to make these infusions, what his day is like.  Mr. Mrs. Jeansonne endorse long days during transfusions.  We also talked about what it would be like if [name] decides he does not want to continue transfusions.  Recommendations/Plan:  Continue to treat the treatable, considering CODE STATUS.  MOST form shared, PMT scheduled visit 6-5 at 10 AM to complete documentation.  At this point continue blood transfusions.  We talked about finding balance, there may come a time in the future where [name] no longer desires blood transfusion.  Goals of Care and Additional Recommendations:  Limitations on Scope of Treatment: DO NOT INTUBATE, otherwise treat the treatable, continue blood transfusions.  Code Status:    Code Status  Orders  (From admission, onward)        Start     Ordered   07/10/17 1741  Limited resuscitation (code)  Continuous    Question Answer Comment  In the event of cardiac or respiratory ARREST: Initiate Code Blue, Call Rapid Response Yes   In the event of cardiac or respiratory ARREST: Perform CPR Yes   In the event of cardiac or respiratory ARREST: Perform Intubation/Mechanical Ventilation No   In the event of cardiac or respiratory ARREST: Use NIPPV/BiPAp only if indicated Yes   In the event of cardiac or respiratory ARREST: Administer ACLS medications if indicated Yes   In the event of cardiac or respiratory ARREST: Perform Defibrillation or Cardioversion if indicated Yes      07/10/17 1740    Code Status History    Date Active Date Inactive Code Status Order ID Comments User Context   06/25/2017 0926 06/29/2017 1552 Partial Code [id number]  Philis Pique, NP Inpatient   06/23/2017 1024 06/25/2017 0926 Full Code [id number]  Samuella Cota, MD ED   11/19/2016 0348 11/19/2016 1606 Full Code [id number]  Norval Morton, MD ED   10/03/2016 0055 10/04/2016 1424 Full Code [id number]  Velna Ochs, MD ED   09/24/2016 1523 09/25/2016 1410 Full Code [id number]  Maryellen Pile, MD Inpatient   08/20/2016 0350 08/22/2016 1649 Full Code [id number]  Maudie Mercury,  Jeneen Rinks, MD ED   10/14/2015 2346 10/18/2015 1346 Full Code [id number]  Zada Finders, MD Inpatient   09/24/2015 1921 09/25/2015 1809 Full Code [id number]  Dellia Nims, MD ED   07/16/2015 0647 07/16/2015 2159 Full Code [id number]  Norval Morton, MD ED   03/17/2015 1450 03/18/2015 1640 Full Code [id number]  Francesca Oman, DO ED   12/08/2011 0757 12/13/2011 1520 Full Code [id number]  Rexene Alberts, MD Inpatient   12/06/2011 1505 12/08/2011 0757 Full Code [id number]  Edwyna Perfect, RN Inpatient    Advance Directive Documentation     Most Recent Value  Type of Advance Directive  Living will, Healthcare Power of Attorney  Pre-existing out of facility DNR  order (yellow form or pink MOST form)  -  "MOST" Form in Place?  -       Prognosis:   < 3 months, or less would not be surprising based on increasing frailty and weakness, dependence on blood transfusion, malnutrition.  Discharge Planning:  Anticipate return to home  Care plan was discussed with nursing staff, Dr. Thereasa Solo on next rounds.  Thank you for allowing the Palliative Medicine Team to assist in the care of this patient.   Time In: 1145 Time Out: 1235 Total Time 50 minutes Prolonged Time Billed  yes       Greater than 50%  of this time was spent counseling and coordinating care related to the above assessment and plan.  Drue Novel, NP  Please contact Palliative Medicine Team phone at [phone] for questions and concerns.

## 2017-07-11 NOTE — Progress Notes (Signed)
Referring Physician(s): Dr Sheral Flow  Supervising Physician: Daryll Brod  Patient Status:  [facility] - In-pt  Chief Complaint:  Perc chole drain leaking  Subjective:  Hx Multiple Myeloma GI bleed secondary AVMs- many transfusions per Care Everywhere  Perc chole drain placed at [facility] 02/24/17 Last drain injection and exchange was 06/08/17 1.Through-the-tube cholangiogram demonstrating nonpatent cystic duct and the cholecystostomy tube in appropriate position. 2.Appropriate drainage was visualized from the tube. 3.Recommend routine follow-up, as already scheduled, for tube change.  Now back in hospitalfor abd pain; weakness; anemia Chole drain tube "leaking" Request made from [facility] to evaluate and exchange cath if needed  Scheduled now for injection and possible exchange Of note-- pt was scheduled for same procedure tomorrow at [facility]   Allergies: Avodart [dutasteride]; Spironolactone; Codeine; Doxazosin; and Feraheme [ferumoxytol]  Medications: Prior to Admission medications   Medication Sig Start Date End Date Taking? Authorizing Provider  acetaminophen (TYLENOL) 325 MG tablet Take 650 mg by mouth every 6 (six) hours as needed for fever.    Yes [provider]  carbidopa-levodopa (SINEMET IR) 25-100 MG tablet Take 1 tablet by mouth 3 (three) times daily.    Yes [provider]  cyanocobalamin 500 MCG tablet Take 500 mcg by mouth 2 (two) times daily. Lunch/dinner   Yes [provider]  docusate sodium (COLACE) 100 MG capsule Take 100 mg by mouth 2 (two) times daily as needed for moderate constipation.  08/20/15  Yes [provider]  finasteride (PROSCAR) 5 MG tablet Take 5 mg by mouth daily.   Yes [provider]  fish oil-omega-3 fatty acids 1000 MG capsule Take 1 g by mouth daily.    Yes [provider]  folic acid (FOLVITE) 1 MG tablet Take 1 mg by mouth daily. 09/24/15  Yes [provider]  furosemide  (LASIX) 40 MG tablet TAKE 2 TABLETS (80 MG TOTAL) BY MOUTH DAILY. TAKE A EXTRA TABLET ON DAYS OF DIALYSIS Patient taking differently: Take 80 mg by mouth 2 (two) times daily. Take 4 tablets on dialysis days 05/15/17  Yes Almyra Deforest, PA  gabapentin (NEURONTIN) 800 MG tablet Take 400-800 mg by mouth See admin instructions. Take 1/2 tablet every morning and at bedtime then take 1 tablet at lunch and dinner   Yes [provider]  metFORMIN (GLUCOPHAGE) 500 MG tablet Take 500 mg by mouth 2 (two) times daily with a meal.   Yes [provider]  morphine (MSIR) 15 MG tablet Take 7.5 mg by mouth every 4 (four) hours as needed for moderate pain.    Yes [provider]  ondansetron (ZOFRAN) 4 MG tablet Take 4 mg by mouth every 8 (eight) hours as needed for nausea or vomiting.   Yes [provider]  pantoprazole (PROTONIX) 40 MG tablet Take 1 tablet (40 mg total) by mouth 2 (two) times daily. For GERD 06/29/17  Yes Regalado, Belkys A, MD  polyvinyl alcohol (LIQUIFILM TEARS) 1.4 % ophthalmic solution Place 1 drop into both eyes 2 (two) times daily as needed for dry eyes. 06/29/17  Yes Regalado, Belkys A, MD  Polyvinyl Alcohol-Povidone PF (REFRESH) 1.4-0.6 % SOLN Place 1 drop into both eyes 2 (two) times daily as needed (dry eyes).   Yes [provider]  simvastatin (ZOCOR) 10 MG tablet Take 10 mg by mouth at bedtime.  09/24/15  Yes [provider]  traMADol (ULTRAM) 50 MG tablet Take 50 mg by mouth every 6 (six) hours as needed for  moderate pain.   Yes [provider]  traZODone (DESYREL) 50 MG tablet Take 50 mg by mouth at bedtime as needed for sleep.    Yes [provider]  UNABLE TO FIND Place 1 application into the right eye daily as needed (eye care). Ocular Ointment    Yes [provider]  zinc oxide 20 % ointment Apply 1 application topically 2 (two) times daily as needed for irritation.   Yes [provider]  polyethylene  glycol (MIRALAX / GLYCOLAX) packet Take 17 g by mouth 2 (two) times daily. Patient not taking: Reported on 07/10/2017 06/29/17   Niel Hummer A, MD     Vital Signs: BP (!) 108/94 (BP Location: Left Arm)   Pulse 74   Temp 98.3 F (36.8 C) (Oral)   Resp 18   Ht 5' 9"  (1.753 m)   Wt 192 lb 4.8 oz (87.2 kg)   SpO2 98%   BMI 28.40 kg/m   Physical Exam  Skin: Skin is warm and dry.  Perc chole drain intact Flushes easily Flush is seeping out from site quickly  Tubing has been slightly dislodged and sutures are no longer intact to skin site   Nursing note and vitals reviewed.   Imaging: Dg Chest Port 1 View  Result Date: 07/10/2017 CLINICAL DATA:  Shortness of breath and tachycardia EXAM: PORTABLE CHEST 1 VIEW COMPARISON:  06/23/2017 FINDINGS: Cardiac shadow remains enlarged. Postsurgical changes are again seen. Right chest wall port is again noted and stable. Persistent vascular congestion and mild interstitial edema is seen. Mild left basilar atelectasis is noted. No bony abnormality is seen. IMPRESSION: Changes of mild CHF. Electronically Signed   By: Inez Catalina M.D.   On: 07/10/2017 12:18    Labs:  CBC: Recent Labs    06/27/17 0754 06/28/17 0522 06/29/17 0430 07/10/17 1156 07/11/17 0404  WBC 5.3 4.7  --  8.5 7.6  HGB 8.2* 8.6* 8.2* 7.7* 8.6*  HCT 26.2* 27.7* 26.2* 24.6* 27.1*  PLT 69* 74*  --  117* 95*    COAGS: Recent Labs    08/19/16 2036 10/02/16 2004 07/10/17 1156  INR 1.17 1.14 1.12    BMP: Recent Labs    06/23/17 0253 06/24/17 0508 07/10/17 1156 07/11/17 0404  NA 132* 134* 130* 129*  K 3.6 3.8 3.1* 3.3*  CL 97* 98* 90* 92*  CO2 22 26 28 28   GLUCOSE 129* 125* 115* 103*  BUN 24* 35* 18 14  CALCIUM 9.4 9.6 8.4* 8.4*  CREATININE 0.52* 0.76 0.58* 0.59*  GFRNONAA >60 >60 >60 >60  GFRAA >60 >60 >60 >60    LIVER FUNCTION TESTS: Recent Labs    11/18/16 2303 06/23/17 0253 07/10/17 1156 07/11/17 0404  BILITOT 3.6* 3.6* 7.5* 9.4*  AST 70*  45* 64* 58*  ALT 11* 13* 11* 8*  ALKPHOS 142* 96 134* 132*  PROT 7.0 5.3* 5.6* 5.3*  ALBUMIN 3.8 2.8* 2.8* 2.6*    Assessment and Plan:  Percutaneous cholecystostomy drain is intact (placed at [facility] 02/2017) Sutures are no longer intact to skin site-- tubing is slightly dislodged Scheduled now for injection /evaluation and possible exchange in IR Pt and wife are aware of procedure bebenfits and risks including but not limited to  Infection; bleeding; damage to surrounding structures Agreeable to proceed Consent signed andin chart   Electronically Signed: Eustacia Urbanek A, PA-C 07/11/2017, 10:46 AM   I spent a total of 25 Minutes at the the patient's bedside AND on the  patient's hospital floor or unit, greater than 50% of which was counseling/coordinating care for perc chole drain

## 2017-07-11 NOTE — Progress Notes (Signed)
[facility] TEAM 1 - Stepdown/ICU TEAM  [name]  HBZ:[id number] DOB: [birth year] DOA: 07/10/2017 PCP: [facility]    Brief Narrative:  72yo M w/ a hx of severe aortic stenosis status post valve replacement x 2 (last in 2013) w/ perivalvular leak and hemolysis (not a repeat surgical candidate), multiple myeloma (off treatment since January 2019), chronic GI blood loss secondary to AVMs severely transfusion dependent (gets 2 units twice a week at [facility] ([facility])), chronic cholecystitis with gallbladder drain placed 02/173, chronic diastolic CHF, and protein calorie malnutrition who was D/C from [facility] on 5/23 after he received 4 units of PRBC.  After d/c he received 1 unit of blood at the cancer center on 5/29, and 2 units on 5/31.  He reported to the [facility] ED c/o progressive weakness and swelling of his lower extremities and abdominal wall.  In the ED he was found to be hypotensive with systolics in the mid 10C, hemoglobin 7.7, and bilirubin 7.5.  Significant Events: 6/3 admit  6/4 IR replaced cholecystostomy tube  Subjective: The patient looks very uncomfortable.  He complains of low back pain related to a recent fall.  He also complains of discomfort in his abdomen due to distention.  He complains of pain in his scrotum due to significant swelling there as well.  He feels intermittently short of breath.  He tells me he feels unwell in general, and very lethargic.  He tells me he has essentially no appetite.  Assessment & Plan:  Acute on chronic anemia with Hypotension multifactorial anemia (chronic GI blood loss/AVMs, untreated multiple myeloma, hemolysis secondary to AVR/perivalvular leak) - gets 4 units of blood twice a week at baseline - treatment will be supportive/conservative - follow w/o further transfusion for now   Recent Labs  Lab 07/10/17 1156 07/11/17 0404  HGB 7.7* 8.6*    Severe aortic stenosis / AVR with perivalvular leak ongoing hemolysis  with elevated indirect bilirubin and schistocytes - valve replacement followed by redo AoVR in 2013 - long history of hemolysis from perivalvular leak - not a repeat surgical candidate any longer - prognosis is very grim   Multiple myeloma followed by Dr. Tiana Loft at [facility] - off all treatments since January 2019  Acute on chronic diastolic CHF Total body volume overloaded (despite intravascular volume depletion) precipitated by anemia, worsening aortic stenosis, and third spacing due low oncotic pressure (anemia and  Hypoalbuminemia) - baseline BP 90-100 per wife - I have attempted to explain to the patient and his wife that his third space volume loss and migratory significant edema will be extremely hard to treat  Chronic cholecystitis Cholecystostomy tube placed in [facility] in Jan 2019 - was exchanged in May 2019 - drain has not been putting out anything for 3-4 days since a fall - IR has now replaced drain   DM2 CBG controlled   Goals of Care Extremely complex patient with very poor prognosis and multisystem issues - pt and wife agreeable to no intubation/partial code but otherwise desire ongoing aggressive care - I do not feel they are grasping the desperate nature of his current condition   DVT prophylaxis: SCDs Code Status: DO NOT INTUBATE but otherwise all other interventions allowed  Family Communication: spoke w/ wife at bedside  Disposition Plan: SDU  Consultants:  IR Palliative Care   Antimicrobials:  none   Objective: Blood pressure 118/61, pulse 82, temperature 98.3 F (36.8 C), temperature source Oral, resp. rate 14, height 5'  9" (1.753 m), weight 87.2 kg (192 lb 4.8 oz), SpO2 92 %.  Intake/Output Summary (Last 24 hours) at 07/11/2017 1614 Last data filed at 07/11/2017 1114 Gross per 24 hour  Intake 4036.4 ml  Output 400 ml  Net 3636.4 ml   Filed Weights   07/10/17 1617 07/11/17 0415  Weight: 82.1 kg (181 lb) 87.2 kg (192 lb 4.8 oz)     Examination: General: No acute respiratory distress Lungs: poor air movement bilateral bases - no wheezing Cardiovascular: 4/6 holosystolic murmur transmitted across entire chest - regular rate Abdomen: protuberant, soft, fluid wave, no rebound, mildly tender diffusely GU:  2+ edema of penis and scrotum - foley cath in place - no skin tear or bleeding  Extremities: 2+ doughy B LE edema   CBC: Recent Labs  Lab 07/10/17 1156 07/11/17 0404  WBC 8.5 7.6  NEUTROABS 7.6  --   HGB 7.7* 8.6*  HCT 24.6* 27.1*  MCV 96.1 91.9  PLT 117* 95*   Basic Metabolic Panel: Recent Labs  Lab 07/10/17 1156 07/11/17 0404  NA 130* 129*  K 3.1* 3.3*  CL 90* 92*  CO2 28 28  GLUCOSE 115* 103*  BUN 18 14  CREATININE 0.58* 0.59*  CALCIUM 8.4* 8.4*   GFR: Estimated Creatinine Clearance: 93.9 mL/min (A) (by C-G formula based on SCr of 0.59 mg/dL (L)).  Liver Function Tests: Recent Labs  Lab 07/10/17 1156 07/11/17 0404  AST 64* 58*  ALT 11* 8*  ALKPHOS 134* 132*  BILITOT 7.5* 9.4*  PROT 5.6* 5.3*  ALBUMIN 2.8* 2.6*   Recent Labs  Lab 07/10/17 1156  LIPASE 29    Coagulation Profile: Recent Labs  Lab 07/10/17 1156  INR 1.12   HbA1C: Hgb A1c MFr Bld  Date/Time Value Ref Range Status  11/30/2011 06:35 AM 4.3 <5.7 % Final    Comment:    (NOTE)                                                                       According to the ADA Clinical Practice Recommendations for 2011, when HbA1c is used as a screening test:  >=6.5%   Diagnostic of Diabetes Mellitus           (if abnormal result is confirmed) 5.7-6.4%   Increased risk of developing Diabetes Mellitus References:Diagnosis and Classification of Diabetes Mellitus,Diabetes Care,2011,34(Suppl 1):S62-S69 and Standards of Medical Care in         Diabetes - 2011,Diabetes HCWC,3762,83 (Suppl 1):S11-S61.  11/28/2011 10:56 AM 4.4 <5.7 % Final    Comment:    (NOTE)                                                                        According to the ADA Clinical Practice Recommendations for 2011, when HbA1c is used as a screening test:  >=6.5%   Diagnostic of Diabetes Mellitus           (if abnormal result is confirmed) 5.7-6.4%  Increased risk of developing Diabetes Mellitus References:Diagnosis and Classification of Diabetes Mellitus,Diabetes KGYJ,8563,14(HFWYO 1):S62-S69 and Standards of Medical Care in         Diabetes - 2011,Diabetes VZCH,8850,27 (Suppl 1):S11-S61.    Scheduled Meds: . carbidopa-levodopa  1 tablet Oral TID  . feeding supplement (ENSURE ENLIVE)  237 mL Oral BID BM  . finasteride  5 mg Oral Daily  . folic acid  1 mg Oral Daily  . furosemide  20 mg Intravenous Q12H  . iopamidol      . lidocaine      . omega-3 acid ethyl esters  1 g Oral Q lunch  . pantoprazole  40 mg Oral BID  . polyethylene glycol  17 g Oral BID  . potassium chloride  20 mEq Oral Once  . simvastatin  10 mg Oral QHS  . sodium chloride flush  5 mL Intracatheter Q8H  . traZODone  50 mg Oral QHS  . vitamin B-12  500 mcg Oral BID AC    LOS: 1 day   Cherene Altes, MD [facility] Office  [phone] Pager - Text Page per Amion as per below:  On-Call/Text Page:      [email or web address]      password [email or web address]  If 7PM-7AM, please contact night-coverage [email or web address] Password [email or web address] 07/11/2017, 4:14 PM

## 2017-07-11 NOTE — Procedures (Signed)
Chronic cholecystitis  S/P CHOLECYSTOSTOMY EXCHG  No comp Stable Bile aspirated Full report in pacs

## 2017-07-11 NOTE — Progress Notes (Addendum)
K 3.3 Lasix due, [facility] paged  Potassium to be given

## 2017-07-12 ENCOUNTER — Encounter (HOSPITAL_COMMUNITY): Payer: Self-pay

## 2017-07-12 DIAGNOSIS — G8929 Other chronic pain: Secondary | ICD-10-CM

## 2017-07-12 DIAGNOSIS — D638 Anemia in other chronic diseases classified elsewhere: Secondary | ICD-10-CM

## 2017-07-12 DIAGNOSIS — R630 Anorexia: Secondary | ICD-10-CM

## 2017-07-12 DIAGNOSIS — M549 Dorsalgia, unspecified: Secondary | ICD-10-CM

## 2017-07-12 DIAGNOSIS — I9589 Other hypotension: Secondary | ICD-10-CM

## 2017-07-12 DIAGNOSIS — I350 Nonrheumatic aortic (valve) stenosis: Secondary | ICD-10-CM

## 2017-07-12 LAB — COMPREHENSIVE METABOLIC PANEL
ALBUMIN: 2.6 g/dL — AB (ref 3.5–5.0)
ALK PHOS: 123 U/L (ref 38–126)
ALT: 6 U/L — ABNORMAL LOW (ref 17–63)
AST: 53 U/L — AB (ref 15–41)
Anion gap: 4 — ABNORMAL LOW (ref 5–15)
BILIRUBIN TOTAL: 6.6 mg/dL — AB (ref 0.3–1.2)
BUN: 14 mg/dL (ref 6–20)
CALCIUM: 8.6 mg/dL — AB (ref 8.9–10.3)
CO2: 30 mmol/L (ref 22–32)
Chloride: 95 mmol/L — ABNORMAL LOW (ref 101–111)
Creatinine, Ser: 0.57 mg/dL — ABNORMAL LOW (ref 0.61–1.24)
GFR calc Af Amer: >60 mL/min (ref >60)
GLUCOSE: 109 mg/dL — AB (ref 65–99)
POTASSIUM: 3.6 mmol/L (ref 3.5–5.1)
Sodium: 129 mmol/L — ABNORMAL LOW (ref 135–145)
TOTAL PROTEIN: 5.4 g/dL — AB (ref 6.5–8.1)

## 2017-07-12 LAB — CBC
HEMATOCRIT: 27.5 % — AB (ref 39.0–52.0)
HEMOGLOBIN: 8.7 g/dL — AB (ref 13.0–17.0)
MCH: 29.9 pg (ref 26.0–34.0)
MCHC: 31.6 g/dL (ref 30.0–36.0)
MCV: 94.5 fL (ref 78.0–100.0)
Platelets: 101 10*3/uL — ABNORMAL LOW (ref 150–400)
RBC: 2.91 MIL/uL — ABNORMAL LOW (ref 4.22–5.81)
RDW: 31 % — AB (ref 11.5–15.5)
WBC: 7.6 10*3/uL (ref 4.0–10.5)

## 2017-07-12 MED ORDER — MORPHINE SULFATE (PF) 2 MG/ML IV SOLN
1.0000 mg | INTRAVENOUS | Status: DC | PRN
  Administered 2017-07-13: 2 mg via INTRAVENOUS
  Filled 2017-07-12: qty 1

## 2017-07-12 MED ORDER — MIDODRINE HCL 5 MG PO TABS
5.0000 mg | ORAL_TABLET | Freq: Three times a day (TID) | ORAL | Status: DC
  Administered 2017-07-12 – 2017-07-13 (×4): 5 mg via ORAL
  Filled 2017-07-12 (×4): qty 1

## 2017-07-12 MED ORDER — ALBUMIN HUMAN 25 % IV SOLN
25.0000 g | Freq: Once | INTRAVENOUS | Status: AC
  Administered 2017-07-12: 25 g via INTRAVENOUS
  Filled 2017-07-12: qty 50

## 2017-07-12 NOTE — Progress Notes (Signed)
Patient was able to get out of bed tonight to use [facility] w/walker for mobility.  He tolerated mobility well w/2 people assisting him.  I will keep monitoring patient.

## 2017-07-12 NOTE — Progress Notes (Signed)
Last ACP Note 04/13/2017 to 07/12/2017       ACP (Advance Care Planning) by Aura Camps, RN at 07/12/2017 11:50 AM    Date of Service   Author Author Type Status Note Type File Time  07/12/2017 Addend Aura Camps, RN Registered Nurse Signed ACP (Advance Care Planning) 07/12/2017             Palliative Medicine RN Note: MOST form completed with patient and his wife. Original and copy for [facility] left on chart. Wishes as follows:  1. If no pulse and no breathing, DNR 2. If there is pulse/breathing, bring to the hospital, but no ETT/vent. Use supportive O2/bipap/NIV and all other aggressive treatments. 3. NO FEEDING TUBE, temorary or permanent 4. Consider the use of abx and IV fluids at the time they are needed.  He verbalized a desire not to prolong suffering, but he is not ready to stop treatment yet.  Marjie Skiff Truth Barot, RN, BSN, [facility] Palliative Medicine Team 07/12/2017 11:52 AM Office [phone]

## 2017-07-12 NOTE — Progress Notes (Signed)
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  After d/c he received 1 unit of blood at the cancer center on 5/29, and 2 units on 5/31.  He reported to the [facility] ED c/o progressive weakness and swelling of his lower extremities and abdominal wall.   In the ED he was found to be hypotensive with systolics in the mid 97L, hemoglobin 7.7, and bilirubin 7.5.    Subjective: 6/5 A/O x4, negative S OB, negative CP, positive abdominal discomfort, positive scrotal swelling, positive bilateral pedal edema.  Also complains of anorexia secondary to how poor food taste to him lately.    Assessment & Plan:   Active Problems:   Iron deficiency anemia, unspecified   S/P aortic valve replacement with bioprosthetic valve   Acute blood loss anemia   Multiple myeloma not having achieved remission (HCC)   Acute on chronic diastolic congestive heart failure (HCC)   Type II diabetes mellitus with complication (HCC)   Goals of care, counseling/discussion   Anemia   Cholecystitis, chronic  Acute on Chronic Hypotension -Multifactorial acute on chronic anemia, multiple myeloma not in remission, hemolyzing secondary to AVR, perivalvular leak, hypoalbuminemia, CHF -Multifactorial   -Albumin 25 g x 1 -Midodrine 5 mg TID  Acute on chronic anemia -Multifactorial chronic GI blood loss secondary to AVM,  perivalvular leak secondary to AVR. - Gets 4 units PRBC at baseline - Treatment be supportive/conservative - Spoke at length to patient and wife concerning the futility continue to transfuse 2 times per week.  Counseled wife and patient that this would not decrease the number of admissions in fact given patient's multisystem organ failure his number/frequency of admissions was going to increase.   Recent Labs  Lab 07/10/17 1156 07/11/17 0404 07/12/17 0443  HGB 7.7* 8.6* 8.7*     Severe aortic stenosis/AVR with perivalvular leak -Ongoing hemolysis with elevated bilirubin assistive/ - Valve replacement followed by redo of aortic valve replacement in 2013.  Patient not candidate for further surgery. - Long history hemolysis from perivalvular leak.  Multiple myeloma not in remission - Patient no longer candidate for further chemotherapy per patient and care everywhere notes. -Seen by Dr. Norma Fredrickson at [facility] cancer center treatment stopped January 2019   Acute on Chronic Diastolic CHF - Strict in and out -Daily weight - Transfuse for hemoglobin<8 -Patient overall volume overloaded however intravascularly depleted.  Third spacing due to low oncotic pressure  Anasarca - Counseled patient and wife anasarca would be difficult to deal with secondary to his low BP, however we would attempt to raise his BP and oncotic pressure in order to gently diurese.  This would be temporary and just for comfort measures.   Anorexia -Counseled patient family they could bring outside food if this would increase likelihood of patient consuming meals.  Chronic cholecystitis -S/P cholecystostomy tube placed in [facility] January 2019 -Cholecystostomy tube exchanged from May 2019 -Last 3 to 4  days has not produced drainage (plugged): IR now replaced.    Chronic cholecystitis  Diabetes type 2 controlled with complication   Severe back pain -Secondary to fall last week while at [facility].  Patient also  with multiple myeloma not in remission - Obtain T-spine, L-spine, - Patient goal is to control back pain.   Goals of Care Extremely complex patient with very poor prognosis and multisystem issues - pt and wife agreeable to no intubation/partial code but otherwise desire ongoing aggressive care - I do not feel they are grasping the desperate nature of his current condition   6/5 after long conversation with patient and wife they now understand that he is in his terminal phase of life.  Would like to speak again with palliative care concerning home with hospice vs inpatient hospice.     DVT prophylaxis: SCD Code Status: Partial? Family Communication: Wife at bedside for discussion plan plan of care Disposition Plan: Home hospice vs inpatient hospice   Consultants:  PALLIATIVE CARE      Procedures/Significant Events:     I have personally reviewed and interpreted all radiology studies and my findings are as above.  VENTILATOR SETTINGS:    Cultures   Antimicrobials: Anti-infectives (From admission, onward)   None       Devices    LINES / TUBES:      Continuous Infusions:   Objective: Vitals:   07/11/17 2021 07/11/17 2030 07/12/17 0500 07/12/17 0600  BP:  (!) 110/53 107/62   Pulse: 83 82 94 93  Resp: (!) 31 (!) 21 (!) 24 17  Temp:  97.6 F (36.4 C) 97.8 F (36.6 C)   TempSrc:  Oral Oral   SpO2: 99% 100% 100% 97%  Weight:   188 lb 14.4 oz (85.7 kg)   Height:        Intake/Output Summary (Last 24 hours) at 07/12/2017 0857 Last data filed at 07/12/2017 1601 Gross per 24 hour  Intake 490 ml  Output 260 ml  Net 230 ml   Filed Weights   07/10/17 1617 07/11/17 0415 07/12/17 0500  Weight: 181 lb (82.1 kg) 192 lb 4.8 oz (87.2 kg) 188 lb 14.4 oz (85.7 kg)    Examination:  General: A/O x4, No acute respiratory distress, cachectic Neck:  Negative scars, masses, torticollis, lymphadenopathy, JVD Lungs: Clear to auscultation bilaterally without wheezes  or crackles Cardiovascular: Regular rate and rhythm without murmur gallop or rub normal S1 and S2 Abdomen: negative abdominal pain, anasarca  Extremities: anasarca  Genitals: Scrotal swelling secondary to anasarca Skin: Negative rashes, lesions, ulcers Psychiatric:  Negative depression, negative anxiety, negative fatigue, negative mania  Central nervous system:  Cranial nerves II through XII intact, tongue/uvula midline, all extremities muscle strength 5/5, sensation intact throughout, negative dysarthria, negative expressive aphasia, negative receptive aphasia.  .     Data Reviewed: Care during the described time interval was provided by me .  I have reviewed this patient's available data, including medical history, events of note, physical examination, and all test results as part of my evaluation.   CBC: Recent Labs  Lab 07/10/17 1156 07/11/17 0404 07/12/17 0443  WBC 8.5 7.6 7.6  NEUTROABS 7.6  --   --   HGB 7.7* 8.6* 8.7*  HCT 24.6* 27.1* 27.5*  MCV 96.1 91.9 94.5  PLT 117* 95* 093*   Basic Metabolic Panel: Recent Labs  Lab 07/10/17 1156 07/11/17 0404 07/12/17 0443  NA 130* 129* 129*  K 3.1* 3.3* 3.6  CL 90*  92* 95*  CO2 28 28 30   GLUCOSE 115* 103* 109*  BUN 18 14 14   CREATININE 0.58* 0.59* 0.57*  CALCIUM 8.4* 8.4* 8.6*   GFR: Estimated Creatinine Clearance: 93.2 mL/min (A) (by C-G formula based on SCr of 0.57 mg/dL (L)). Liver Function Tests: Recent Labs  Lab 07/10/17 1156 07/11/17 0404 07/12/17 0443  AST 64* 58* 53*  ALT 11* 8* 6*  ALKPHOS 134* 132* 123  BILITOT 7.5* 9.4* 6.6*  PROT 5.6* 5.3* 5.4*  ALBUMIN 2.8* 2.6* 2.6*   Recent Labs  Lab 07/10/17 1156  LIPASE 29   No results for input(s): AMMONIA in the last 168 hours. Coagulation Profile: Recent Labs  Lab 07/10/17 1156  INR 1.12   Cardiac Enzymes: No results for input(s): CKTOTAL, CKMB, CKMBINDEX, TROPONINI in the last 168 hours. BNP (last 3 results) No results for input(s): PROBNP in  the last 8760 hours. HbA1C: No results for input(s): HGBA1C in the last 72 hours. CBG: No results for input(s): GLUCAP in the last 168 hours. Lipid Profile: No results for input(s): CHOL, HDL, LDLCALC, TRIG, CHOLHDL, LDLDIRECT in the last 72 hours. Thyroid Function Tests: No results for input(s): TSH, T4TOTAL, FREET4, T3FREE, THYROIDAB in the last 72 hours. Anemia Panel: No results for input(s): VITAMINB12, FOLATE, FERRITIN, TIBC, IRON, RETICCTPCT in the last 72 hours. Urine analysis:    Component Value Date/Time   COLORURINE AMBER (A) 06/23/2017 0215   APPEARANCEUR HAZY (A) 06/23/2017 0215   LABSPEC 1.021 06/23/2017 0215   PHURINE 5.0 06/23/2017 0215   GLUCOSEU NEGATIVE 06/23/2017 0215   GLUCOSEU NEGATIVE 04/25/2012 1108   HGBUR MODERATE (A) 06/23/2017 0215   BILIRUBINUR NEGATIVE 06/23/2017 0215   KETONESUR 5 (A) 06/23/2017 0215   PROTEINUR NEGATIVE 06/23/2017 0215   UROBILINOGEN 0.2 05/17/2012 1021   NITRITE NEGATIVE 06/23/2017 0215   LEUKOCYTESUR NEGATIVE 06/23/2017 0215   Sepsis Labs: @LABRCNTIP (procalcitonin:4,lacticidven:4)  )No results found for this or any previous visit (from the past 240 hour(s)).       Radiology Studies: Ir Catheter Tube Change  Result Date: 07/11/2017 INDICATION: Chronic cholecystitis, retracted occluded cholecystostomy EXAM: FLUOROSCOPIC CHOLECYSTOSTOMY REPOSITION AND EXCHANGE MEDICATIONS: 1% LIDOCAINE LOCAL ANESTHESIA/SEDATION: Moderate Sedation Time:  None. The patient was continuously monitored during the procedure by the interventional radiology nurse under my direct supervision. COMPLICATIONS: None immediate. PROCEDURE: Informed written consent was obtained from the patient after a thorough discussion of the procedural risks, benefits and alternatives. All questions were addressed. Maximal Sterile Barrier Technique was utilized including caps, mask, sterile gowns, sterile gloves, sterile drape, hand hygiene and skin antiseptic. A timeout was  performed prior to the initiation of the procedure. Initially, the cholecystostomy catheter was injected with contrast. The catheter is partially occluded and nearly retracted into the percutaneous tract. Under sterile conditions and local anesthesia, the catheter was cut and removed over an Amplatz guidewire. A new 10 French catheter was advanced with the retention loop formed in the gallbladder. Position confirmed with contrast injection. Images obtained for documentation. Gallbladder was decompressed by syringe aspiration. Catheter secured with a prolene suture and a sterile dressing. Gravity drainage bag connected. IMPRESSION: Successful fluoroscopic exchange and reposition of the cholecystostomy. Electronically Signed   By: Jerilynn Mages.  Shick M.D.   On: 07/11/2017 14:53   Dg Chest Port 1 View  Result Date: 07/10/2017 CLINICAL DATA:  Shortness of breath and tachycardia EXAM: PORTABLE CHEST 1 VIEW COMPARISON:  06/23/2017 FINDINGS: Cardiac shadow remains enlarged. Postsurgical changes are again seen. Right chest wall port is again noted and  stable. Persistent vascular congestion and mild interstitial edema is seen. Mild left basilar atelectasis is noted. No bony abnormality is seen. IMPRESSION: Changes of mild CHF. Electronically Signed   By: Inez Catalina M.D.   On: 07/10/2017 12:18        Scheduled Meds: . carbidopa-levodopa  1 tablet Oral TID  . feeding supplement (ENSURE ENLIVE)  237 mL Oral TID BM  . finasteride  5 mg Oral Daily  . folic acid  1 mg Oral Daily  . furosemide  20 mg Intravenous Q12H  . omega-3 acid ethyl esters  1 g Oral Q lunch  . pantoprazole  40 mg Oral BID  . polyethylene glycol  17 g Oral BID  . potassium chloride  20 mEq Oral Once  . simvastatin  10 mg Oral QHS  . sodium chloride flush  5 mL Intracatheter Q8H  . traZODone  50 mg Oral QHS  . vitamin B-12  500 mcg Oral BID AC   Continuous Infusions:   LOS: 2 days    Time spent: 40 minutes    WOODS, Geraldo Docker, MD [facility] Pager [phone]   If 7PM-7AM, please contact night-coverage [email or web address] Password [email or web address] 07/12/2017, 8:57 AM

## 2017-07-12 NOTE — ACP (Advance Care Planning) (Signed)
Palliative Medicine RN Note: MOST form completed with patient and his wife. Original and copy for [facility] left on chart. Wishes as follows:  1. If no pulse and no breathing, DNR 2. If there is pulse/breathing, bring to the hospital, but no ETT/vent. Use supportive O2/bipap/NIV and all other aggressive treatments. 3. NO FEEDING TUBE, temorary or permanent 4. Consider the use of abx and IV fluids at the time they are needed.  He verbalized a desire not to prolong suffering, but he is not ready to stop treatment yet.  Marjie Skiff Bohdan Macho, RN, BSN, [facility] Palliative Medicine Team 07/12/2017 11:52 AM Office [phone]

## 2017-07-12 NOTE — Progress Notes (Signed)
Initial Nutrition Assessment  DOCUMENTATION CODES:   Severe malnutrition in context of chronic illness  INTERVENTION:    Ensure Enlive po TID, each supplement provides 350 kcal and 20 grams of protein  Snacks: Magic cup TID between meals, each supplement provides 290 kcal and 9 grams of protein  NUTRITION DIAGNOSIS:   Severe Malnutrition related to chronic illness(multiple myeloma, CAD) as evidenced by severe fat depletion, severe muscle depletion.  GOAL:   Patient will meet greater than or equal to 90% of their needs  MONITOR:   PO intake, Supplement acceptance, Labs  REASON FOR ASSESSMENT:   Malnutrition Screening Tool    ASSESSMENT:   72 yo male with PMH of HTN, GERD, HLD, anemia, diverticulosis, OSA, IBS, DM, severe aortic stenosis s/p valve replacement x 2, hemolysis secondary to AVR/perivalvular leak) - gets 4 units of blood twice a week at baseline, and multiple myeloma who was admitted on 6/3 with progressive weakness and swelling of BLE and abdomen.   Patient reports eating poorly for the past few months. He ate a little better at breakfast today, consumed 75% of breakfast tray. He likes Ensure supplements and magic cups. He ordered 2 magic cups with lunch today. He requests magic cups and fruit between meals.   Current weight is elevated due to volume overload.  Palliative Care team following.  Labs reviewed. Sodium 129 (L) Medications reviewed and include folic acid, lasix, miralax, KCl, vitamin B-12.  NUTRITION - FOCUSED PHYSICAL EXAM:  Unable to assess some areas due to severe swelling.     Most Recent Value  Orbital Region  Moderate depletion  Upper Arm Region  Unable to assess  Thoracic and Lumbar Region  Severe depletion  Buccal Region  Severe depletion  Temple Region  Severe depletion  Clavicle Bone Region  Severe depletion  Clavicle and Acromion Bone Region  Severe depletion  Scapular Bone Region  Severe depletion  Dorsal Hand  Mild depletion   Patellar Region  Unable to assess  Anterior Thigh Region  Unable to assess  Posterior Calf Region  Unable to assess  Edema (RD Assessment)  Severe  Hair  Reviewed  Eyes  Reviewed  Mouth  Reviewed  Skin  Reviewed (pale)  Nails  Reviewed       Diet Order:   Diet Order           Diet regular Room service appropriate? Yes; Fluid consistency: Thin  Diet effective now          EDUCATION NEEDS:   No education needs have been identified at this time  Skin:  Skin Assessment: Skin Integrity Issues: Skin Integrity Issues:: Stage I Stage I: sacrum  Last BM:  6/1  Height:   Ht Readings from Last 1 Encounters:  07/10/17 5' 9" (1.753 m)    Weight:   Wt Readings from Last 1 Encounters:  07/12/17 188 lb 14.4 oz (85.7 kg)    Ideal Body Weight:  72.7 kg  BMI:  Body mass index is 27.9 kg/m.  Estimated Nutritional Needs:   Kcal:  2000-2200  Protein:  100-115 gm  Fluid:  2 L     , RD, LDN, CNSC Pager [phone] After Hours Pager [phone]  

## 2017-07-12 NOTE — Progress Notes (Signed)
Transport arrived to take patient to radiology for spinal xrays; however patient stating he does not want to go tonight and that he does not want to be moved. Dr. Sherral Hammers is aware. Plan for pain control tonight and to reassess tomorrow.

## 2017-07-13 ENCOUNTER — Inpatient Hospital Stay (HOSPITAL_COMMUNITY): Payer: Medicare Other

## 2017-07-13 DIAGNOSIS — R601 Generalized edema: Secondary | ICD-10-CM

## 2017-07-13 DIAGNOSIS — R52 Pain, unspecified: Secondary | ICD-10-CM

## 2017-07-13 DIAGNOSIS — F411 Generalized anxiety disorder: Secondary | ICD-10-CM

## 2017-07-13 LAB — BASIC METABOLIC PANEL
Anion gap: 5 (ref 5–15)
BUN: 15 mg/dL (ref 6–20)
CALCIUM: 8.8 mg/dL — AB (ref 8.9–10.3)
CHLORIDE: 94 mmol/L — AB (ref 101–111)
CO2: 30 mmol/L (ref 22–32)
CREATININE: 0.61 mg/dL (ref 0.61–1.24)
GFR calc non Af Amer: >60 mL/min (ref >60)
Glucose, Bld: 99 mg/dL (ref 65–99)
Potassium: 3.5 mmol/L (ref 3.5–5.1)
SODIUM: 129 mmol/L — AB (ref 135–145)

## 2017-07-13 LAB — MAGNESIUM: MAGNESIUM: 1.6 mg/dL — AB (ref 1.7–2.4)

## 2017-07-13 LAB — CBC
HEMATOCRIT: 25 % — AB (ref 39.0–52.0)
Hemoglobin: 7.9 g/dL — ABNORMAL LOW (ref 13.0–17.0)
MCH: 30 pg (ref 26.0–34.0)
MCHC: 31.6 g/dL (ref 30.0–36.0)
MCV: 95.1 fL (ref 78.0–100.0)
Platelets: 91 10*3/uL — ABNORMAL LOW (ref 150–400)
RBC: 2.63 MIL/uL — ABNORMAL LOW (ref 4.22–5.81)
RDW: 29.8 % — AB (ref 11.5–15.5)
WBC: 6.6 10*3/uL (ref 4.0–10.5)

## 2017-07-13 MED ORDER — MIDODRINE HCL 5 MG PO TABS
10.0000 mg | ORAL_TABLET | Freq: Three times a day (TID) | ORAL | Status: DC
  Administered 2017-07-14 – 2017-07-17 (×9): 10 mg via ORAL
  Filled 2017-07-13 (×9): qty 2

## 2017-07-13 MED ORDER — MORPHINE SULFATE (PF) 2 MG/ML IV SOLN
1.0000 mg | INTRAVENOUS | Status: DC | PRN
  Administered 2017-07-14: 1 mg via INTRAVENOUS
  Filled 2017-07-13: qty 1

## 2017-07-13 MED ORDER — ALPRAZOLAM 0.25 MG PO TABS
0.2500 mg | ORAL_TABLET | Freq: Three times a day (TID) | ORAL | Status: DC | PRN
  Administered 2017-07-13 – 2017-07-14 (×2): 0.25 mg via ORAL
  Filled 2017-07-13 (×2): qty 1

## 2017-07-13 MED ORDER — BISACODYL 5 MG PO TBEC: 5.0000 mg | DELAYED_RELEASE_TABLET | Freq: Every day | ORAL | Status: DC | PRN

## 2017-07-13 NOTE — Progress Notes (Signed)
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  After d/c he received 1 unit of blood at the cancer center on 5/29, and 2 units on 5/31.  He reported to the [facility] ED c/o progressive weakness and swelling of his lower extremities and abdominal wall.   In the ED he was found to be hypotensive with systolics in the mid 97J, hemoglobin 7.7, and bilirubin 7.5.    Subjective: 6/6 sleepy but arousable.  A/O x4.  Negative S OB, neck CP.  Continue back pain.  Continue scrotal swelling.  Positive anxiety concerning overall condition.    Assessment & Plan:   Active Problems:   Iron deficiency anemia, unspecified   S/P aortic valve replacement with bioprosthetic valve   Acute blood loss anemia   Multiple myeloma not having achieved remission (HCC)   Acute on chronic diastolic congestive heart failure (HCC)   Type II diabetes mellitus with complication (HCC)   Goals of care, counseling/discussion   Anemia   Cholecystitis, chronic  Acute on Chronic Hypotension -Multifactorial acute on chronic anemia, multiple myeloma not in remission, hemolyzing secondary to AVR, perivalvular leak, hypoalbuminemia, CHF -Albumin 25 g x 1 -6/6 increase Midodrine 10 mg  TID   Acute on chronic anemia -Multifactorial chronic GI blood loss secondary to AVM, perivalvular leak secondary to AVR. - Gets 4 units  PRBC weekly at baseline - Treatment be supportive/conservative - Spoke at length to patient and wife concerning the futility continuing to transfuse 2 times per week.  Counseled wife and patient that this would not decrease the number of admissions in fact given patient's multisystem organ failure his number/frequency of admissions was going to increase.   Recent Labs  Lab 07/10/17 1156 07/11/17 0404 07/12/17 0443 07/13/17 0500  HGB 7.7* 8.6* 8.7* 7.9*  -Patient's hemoglobin continues to slowly trend down secondary to hemolysis    Severe aortic stenosis/AVR with perivalvular leak -Ongoing hemolysis with elevated bilirubin  - Valve replacement followed by redo of aortic valve replacement in 2013.  Patient not candidate for further surgery. - Long history hemolysis from perivalvular leak.  Multiple myeloma not in remission - Patient no longer candidate for further chemotherapy per patient and care everywhere notes. -Seen by Dr. Norma Fredrickson at [facility] cancer center treatment stopped January 2019   Acute on Chronic Diastolic CHF - Strict in and out since admission +3.6 L -Daily weight Filed Weights   07/11/17 0415 07/12/17 0500 07/13/17 0634  Weight: 192 lb 4.8 oz (87.2 kg) 188 lb 14.4 oz (85.7 kg) 190 lb 1.6 oz (86.2 kg)  - Transfuse for hemoglobin<8 -Patient overall volume overloaded however intravascularly depleted.  Third spacing due to low oncotic pressure  Anasarca - Counseled patient and wife anasarca would be difficult to deal with secondary to his low BP, however we would attempt to raise his BP and oncotic pressure in order to gently diurese.  This would be temporary and  just for comfort measures.   Anorexia -Counseled patient family they could bring outside food if this would increase likelihood of patient consuming meals.  Chronic cholecystitis -S/P cholecystostomy tube placed in [facility] January 2019 -Cholecystostomy tube exchanged from May 2019 -Last 3 to 4 days has not  produced drainage (plugged): IR now replaced.    Chronic cholecystitis -6/6 cholecystostomy tube appears to be plugged, flush with 30 cc sterile saline daily  Diabetes type 2 controlled with complication   Severe back pain/T-spine fracture -Secondary to fall last week while at [facility].  Patient also with multiple myeloma not in remission - T-spine/L-spine x-ray.  T-spine fracture indeterminate age see results below.  Patient moving toward hospice will not follow-up with MRI as recommended.   .   Goals of Care Extremely complex patient with very poor prognosis and multisystem issues - pt and wife agreeable to no intubation/partial code but otherwise desire ongoing aggressive care - I do not feel they are grasping the desperate nature of his current condition   6/5 after long conversation with patient and wife they now understand that he is in his terminal phase of life.  Would like to speak again with palliative care concerning home with hospice vs inpatient hospice.     DVT prophylaxis: SCD Code Status: Partial? Family Communication: Wife at bedside for discussion plan plan of care Disposition Plan: Home hospice vs inpatient hospice   Consultants:  PALLIATIVE CARE      Procedures/Significant Events:  6/6 L-spine/T-spine:-Mild compression deformity seen involving lower thoracic vertebral body consistent with fracture of indeterminate age -Minimal osteophyte formation is noted at L1-2 and L4-5. Diffuse osteopenia is noted     I have personally reviewed and interpreted all radiology studies and my findings are as above.  VENTILATOR SETTINGS:    Cultures   Antimicrobials: Anti-infectives (From admission, onward)   None       Devices    LINES / TUBES:      Continuous Infusions:   Objective: Vitals:   07/12/17 1432 07/12/17 2028 07/12/17 2110 07/13/17 0634  BP: 114/75 121/71 123/61 (!) 93/56  Pulse: 78 84  84  Resp: 18 (!) 21  20  Temp: 97.8 F  (36.6 C) 98.2 F (36.8 C)  97.7 F (36.5 C)  TempSrc: Oral Oral  Oral  SpO2: 98% 96%  99%  Weight:    190 lb 1.6 oz (86.2 kg)  Height:        Intake/Output Summary (Last 24 hours) at 07/13/2017 0912 Last data filed at 07/13/2017 0901 Gross per 24 hour  Intake 830 ml  Output 1175 ml  Net -345 ml   Filed Weights   07/11/17 0415 07/12/17 0500 07/13/17 0634  Weight: 192 lb 4.8 oz (87.2 kg) 188 lb 14.4 oz (85.7 kg) 190 lb 1.6 oz (86.2 kg)    Physical Exam:  General: A/O x4, No acute respiratory distress, cachectic, jaundiced Neck:  Negative scars, masses, torticollis, lymphadenopathy, JVD Lungs: Clear to auscultation bilaterally without wheezes or crackles Cardiovascular: Regular rate and rhythm without murmur gallop or rub normal S1 and S2 Abdomen: negative abdominal pain, anasarca  Extremities: anasarca Genitals: Scrotal swelling secondary to anasarca  Skin: Negative rashes, lesions, ulcers Psychiatric:  Negative depression, positive anxiety, negative fatigue, negative mania  Central nervous system:  Cranial nerves II through XII intact, tongue/uvula midline, all extremities muscle strength 5/5, sensation intact throughout,negative dysarthria, negative expressive aphasia, negative receptive aphasia.   .     Data Reviewed: Care during  the described time interval was provided by me .  I have reviewed this patient's available data, including medical history, events of note, physical examination, and all test results as part of my evaluation.   CBC: Recent Labs  Lab 07/10/17 1156 07/11/17 0404 07/12/17 0443 07/13/17 0500  WBC 8.5 7.6 7.6 6.6  NEUTROABS 7.6  --   --   --   HGB 7.7* 8.6* 8.7* 7.9*  HCT 24.6* 27.1* 27.5* 25.0*  MCV 96.1 91.9 94.5 95.1  PLT 117* 95* 101* 91*   Basic Metabolic Panel: Recent Labs  Lab 07/10/17 1156 07/11/17 0404 07/12/17 0443 07/13/17 0500  NA 130* 129* 129* 129*  K 3.1* 3.3* 3.6 3.5  CL 90* 92* 95* 94*  CO2 _0 GLUCOSE 115*  103* 109* 99  BUN _1 CREATININE 0.58* 0.59* 0.57* 0.61  CALCIUM 8.4* 8.4* 8.6* 8.8*  MG  --   --   --  1.6*   GFR: Estimated Creatinine Clearance: 93.5 mL/min (by C-G formula based on SCr of 0.61 mg/dL). Liver Function Tests: Recent Labs  Lab 07/10/17 1156 07/11/17 0404 07/12/17 0443  AST 64* 58* 53*  ALT 11* 8* 6*  ALKPHOS 134* 132* 123  BILITOT 7.5* 9.4* 6.6*  PROT 5.6* 5.3* 5.4*  ALBUMIN 2.8* 2.6* 2.6*   Recent Labs  Lab 07/10/17 1156  LIPASE 29   No results for input(s): AMMONIA in the last 168 hours. Coagulation Profile: Recent Labs  Lab 07/10/17 1156  INR 1.12   Cardiac Enzymes: No results for input(s): CKTOTAL, CKMB, CKMBINDEX, TROPONINI in the last 168 hours. BNP (last 3 results) No results for input(s): PROBNP in the last 8760 hours. HbA1C: No results for input(s): HGBA1C in the last 72 hours. CBG: No results for input(s): GLUCAP in the last 168 hours. Lipid Profile: No results for input(s): CHOL, HDL, LDLCALC, TRIG, CHOLHDL, LDLDIRECT in the last 72 hours. Thyroid Function Tests: No results for input(s): TSH, T4TOTAL, FREET4, T3FREE, THYROIDAB in the last 72 hours. Anemia Panel: No results for input(s): VITAMINB12, FOLATE, FERRITIN, TIBC, IRON, RETICCTPCT in the last 72 hours. Urine analysis:    Component Value Date/Time   COLORURINE AMBER (A) 06/23/2017 0215   APPEARANCEUR HAZY (A) 06/23/2017 0215   LABSPEC 1.021 06/23/2017 0215   PHURINE 5.0 06/23/2017 0215   GLUCOSEU NEGATIVE 06/23/2017 0215   GLUCOSEU NEGATIVE 04/25/2012 1108   HGBUR MODERATE (A) 06/23/2017 0215   BILIRUBINUR NEGATIVE 06/23/2017 0215   KETONESUR 5 (A) 06/23/2017 0215   PROTEINUR NEGATIVE 06/23/2017 0215   UROBILINOGEN 0.2 05/17/2012 1021   NITRITE NEGATIVE 06/23/2017 0215   LEUKOCYTESUR NEGATIVE 06/23/2017 0215   Sepsis Labs: _2 (procalcitonin:4,lacticidven:4)  )No results found for this or any previous visit (from the past 240 hour(s)).        Radiology Studies: Ir Exchange Biliary Drain  Result Date: 07/11/2017 INDICATION: Chronic cholecystitis, retracted occluded cholecystostomy EXAM: FLUOROSCOPIC CHOLECYSTOSTOMY REPOSITION AND EXCHANGE MEDICATIONS: 1% LIDOCAINE LOCAL ANESTHESIA/SEDATION: Moderate Sedation Time:  None. The patient was continuously monitored during the procedure by the interventional radiology nurse under my direct supervision. COMPLICATIONS: None immediate. PROCEDURE: Informed written consent was obtained from the patient after a thorough discussion of the procedural risks, benefits and alternatives. All questions were addressed. Maximal Sterile Barrier Technique was utilized including caps, mask, sterile gowns, sterile gloves, sterile drape, hand hygiene and skin antiseptic. A timeout was performed prior to the initiation of the procedure. Initially, the cholecystostomy catheter was injected with contrast. The  catheter is partially occluded and nearly retracted into the percutaneous tract. Under sterile conditions and local anesthesia, the catheter was cut and removed over an Amplatz guidewire. A new 10 French catheter was advanced with the retention loop formed in the gallbladder. Position confirmed with contrast injection. Images obtained for documentation. Gallbladder was decompressed by syringe aspiration. Catheter secured with a prolene suture and a sterile dressing. Gravity drainage bag connected. IMPRESSION: Successful fluoroscopic exchange and reposition of the cholecystostomy. Electronically Signed   By: Jerilynn Mages.  Shick M.D.   On: 07/11/2017 14:53        Scheduled Meds: . carbidopa-levodopa  1 tablet Oral TID  . feeding supplement (ENSURE ENLIVE)  237 mL Oral TID BM  . finasteride  5 mg Oral Daily  . folic acid  1 mg Oral Daily  . furosemide  20 mg Intravenous Q12H  . midodrine  5 mg Oral TID WC  . omega-3 acid ethyl esters  1 g Oral Q lunch  . pantoprazole  40 mg Oral BID  . polyethylene glycol  17 g  Oral BID  . potassium chloride  20 mEq Oral Once  . simvastatin  10 mg Oral QHS  . sodium chloride flush  5 mL Intracatheter Q8H  . traZODone  50 mg Oral QHS  . vitamin B-12  500 mcg Oral BID AC   Continuous Infusions:   LOS: 3 days    Time spent: 40 minutes    , Geraldo Docker, MD [facility] Pager [phone]   If 7PM-7AM, please contact night-coverage [email or web address] Password [email or web address] 07/13/2017, 9:12 AM

## 2017-07-13 NOTE — Progress Notes (Signed)
Patient unable to get comfortable, feels in pain due to his back pain.  I have placed cream on his back and provided pain medication.  He refuses any pillows for back support.  Although he agrees to be pulled up in bed.  I will keep monitoring patient.

## 2017-07-13 NOTE — Progress Notes (Signed)
Daily Progress Note   Patient Name: [name]       Date: 07/13/2017 DOB: May 04, 1945  Age: 72 y.o. MRN#: [id number] Attending Physician: Allie Bossier, MD Primary Care Physician: [facility] Admit Date: 07/10/2017  Reason for Consultation/Follow-up: Establishing goals of care and Terminal Care  Subjective/GOC:  Patient initially drowsy and does not participate in conversation when I arrived. Introduced myself to wife [name]) and role of palliative medicine. Wife recaps the past few years of his declining health since diagnosis of multiple myeloma. He has required ongoing weekly transfusions, now up to 4 PRBC's per week. [name] feels he may be "tired" of receiving blood transfusions. I asked about her conversation with Dr. Sherral Hammers yesterday regarding diagnoses, interventions, and poor prognosis. Asked wife if she was familiar with the services hospice can provide. Educated on hospice philosophy and options. She speaks of serving as primary caregiver for him (as well as many family members in the last 20 years) and that she feels she could handle handle him at home. Also that they have not yet decided to discontinue blood transfusions or not.   At this point in the conversation, Dr. Sherral Hammers at bedside and patient wakes and willing to participate in conversation.   Dr. Sherral Hammers again discussed extensively diagnoses, blood transfusions that are becoming futile, and poor prognosis. 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 Further discussed hospice options with patient and wife. They are not able to make a  decision regarding goals or hospice today.   Therapeutic listening as Mr. And Mrs. Lizer stories of their love of 32+ years. Unfortunately by the end of our conversation, patient becomes very anxious. He speaks of having a "hard time coping." He does not wish to further discuss goals or his wishes. Wife attempting to calm him down. Instructed RN to give prn xanax. Emotional support provided.   PMT contact information given.   Length of Stay: 3  Current Medications: Scheduled Meds:  . carbidopa-levodopa  1 tablet Oral TID  . feeding supplement (ENSURE ENLIVE)  237 mL Oral TID BM  . finasteride  5 mg Oral Daily  . folic  acid  1 mg Oral Daily  . furosemide  20 mg Intravenous Q12H  . midodrine  5 mg Oral TID WC  . omega-3 acid ethyl esters  1 g Oral Q lunch  . pantoprazole  40 mg Oral BID  . polyethylene glycol  17 g Oral BID  . potassium chloride  20 mEq Oral Once  . simvastatin  10 mg Oral QHS  . sodium chloride flush  5 mL Intracatheter Q8H  . traZODone  50 mg Oral QHS  . vitamin B-12  500 mcg Oral BID AC    Continuous Infusions:   PRN Meds: acetaminophen **OR** acetaminophen, ALPRAZolam, alum & mag hydroxide-simeth, diclofenac sodium, docusate sodium, fentaNYL (SUBLIMAZE) injection, morphine injection, ondansetron **OR** ondansetron (ZOFRAN) IV, oxyCODONE, polyvinyl alcohol, simethicone  Physical Exam  Constitutional: He is oriented to person, place, and time. He appears ill.  Appears acutely/chronically ill, makes and keeps eye contact  HENT:  Head: Atraumatic.  Some temporal wasting  Cardiovascular: Normal rate.  Pulmonary/Chest: No accessory muscle usage. No tachypnea. No respiratory distress.  Abdominal: Soft. He exhibits distension. There is no guarding.  Perc drain  Musculoskeletal:       Right lower leg: He exhibits edema.       Left lower leg: He exhibits edema.  Neurological: He is alert and oriented to person, place, and time.  Skin: Skin is warm and dry.    Pale/jaundice  Psychiatric: His mood appears anxious. His speech is delayed.  irritable  Nursing note and vitals reviewed.          Vital Signs: BP 107/61 (BP Location: Left Arm)   Pulse 69   Temp 98.9 F (37.2 C) (Oral)   Resp 14   Ht 5' 9"  (1.753 m)   Wt 86.2 kg (190 lb 1.6 oz)   SpO2 98%   BMI 28.07 kg/m  SpO2: SpO2: 98 % O2 Device: O2 Device: Room Air O2 Flow Rate: O2 Flow Rate (L/min): 0 L/min  Intake/output summary:   Intake/Output Summary (Last 24 hours) at 07/13/2017 1818 Last data filed at 07/13/2017 1707 Gross per 24 hour  Intake 970 ml  Output 1450 ml  Net -480 ml   LBM: Last BM Date: 07/08/17 Baseline Weight: Weight: 82.1 kg (181 lb) Most recent weight: Weight: 86.2 kg (190 lb 1.6 oz)       Palliative Assessment/Data: PPS 40%   Flowsheet Rows     Most Recent Value  Intake Tab  Referral Department  -- [ED]  Unit at Time of Referral  ER  Palliative Care Primary Diagnosis  Cancer  Date Notified  07/10/17  Palliative Care Type  Return patient Palliative Care  Reason for referral  Clarify Goals of Care  Date of Admission  07/10/17  Date first seen by Palliative Care  07/11/17  # of days Palliative referral response time  1 Day(s)  # of days IP prior to Palliative referral  0  Clinical Assessment  Psychosocial & Spiritual Assessment  Palliative Care Outcomes      Patient Active Problem List   Diagnosis Date Noted  . Cholecystitis, chronic   . Anemia 07/10/2017  . Thrombocytopenia ([place])   . Protein-calorie malnutrition, severe 06/25/2017  . Goals of care, counseling/discussion   . Palliative care by specialist   . Demand ischemia ([place]) 06/23/2017  . Hyponatremia 06/23/2017  . Acute respiratory distress 11/19/2016  . Atrial flutter ([place]) 11/19/2016  . Anemia due to chronic blood loss   . Pulmonary edema 10/02/2016  .  Dehydration   . Shortness of breath 09/24/2016  . Dyspnea 08/20/2016  . Pancytopenia ([place]) 08/20/2016  . Type II diabetes  mellitus with complication ([place]) 37/11/6267  . Hypoxia 08/20/2016  . CHF (congestive heart failure) ([place]) 08/20/2016  . Sepsis, unspecified organism ([place]) 03/23/2016  . Acute on chronic diastolic congestive heart failure ([place]) 03/23/2016  . Influenza A 03/23/2016  . Acute respiratory failure ([place]) 03/23/2016  . 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 Rectal bleeding   . Internal bleeding hemorrhoids   . Symptomatic anemia 07/16/2015  . GI bleed 07/16/2015  . Hemorrhoids 07/16/2015  . Multiple myeloma not having achieved remission ([place])   . Syncope and collapse   . Acute blood loss anemia 03/17/2015  . Bruit 01/19/2015  . S/P aortic valve replacement with bioprosthetic valve 12/06/2011  . Aortic stenosis 11/30/2011  . Chronic daily headache 11/30/2011  . Iron deficiency anemia, unspecified 09/15/2011  . IBS (irritable bowel syndrome) 09/15/2011  . Syncope 08/29/2011  . Cough syncope 10/13/2010  . HYPERLIPIDEMIA-MIXED 07/02/2008  . Essential hypertension 07/02/2008  . GERD 07/02/2008  . OBSTRUCTIVE SLEEP APNEA 01/15/2007  . ALLERGIC  RHINITIS 01/15/2007  . INSOMNIA 01/15/2007  . OSTEOARTHRITIS 12/02/2006  . BPH (benign prostatic hyperplasia) 12/02/2006  . H/O aortic valve replacement 12/02/2006    Palliative Care Assessment & Plan   Patient Profile: Mr. Mooneyhan is a 72 year old man with a past medical history of multiple myeloma no treatment since January 2019 due to chronic cholecystitis with gallbladder drain placement, chronic GI bleeding secondary to AVMs-transfusion dependent gets 2 units of blood twice a week at [facility] cancer center, chronic diastolic heart failure, protein calorie malnutrition, recently discharged from [facility] approximately 10 days ago where he received 4 units of PRBC admitted on 6-3 with weakness and short of breath.  Assessment: Multiple Myeloma not in remission Acute on chronic anemia Chronic GI blood loss with  AVM's Severe aortic stenosis Perivalvular leak secondary to AVR Acute on chronic diastolic CHF Hypotension Anasarca Chronic cholecystitis s/p cholecystostomy tube T-spine fractures Chronic back pain  Recommendations/Plan:  Extensive GOC conversation with patient, wife, and Dr. Sherral Hammers. Patient and wife struggle with accepting poor prognosis and withdrawal of care. Patient becomes very anxious during my conversation and unable to finish [place] conversation.   Symptom management  Xanax 0.80m PO TID prn  Oxycodone 5-1331mPO q4h prn moderate pain  Morphine 1-31m30mV q3h prn severe pain  Scheduled Colace and Miralax  Dulcolax 5mg41m daily prn  MOST form completed with palliative RN yesterday. Limited code. MOST form reviewed.   Hospice options discussed. Patient/wife not ready to make any decisions today regarding discontinuation of transfusions and/or hospice.   Will need further palliative discussions.  Code Status:    Code Status Orders  (From admission, onward)        Start     Ordered   07/10/17 1741  Limited resuscitation (code)  Continuous    Question Answer Comment  In the event of cardiac or respiratory ARREST: Initiate Code Blue, Call Rapid Response Yes   In the event of cardiac or respiratory ARREST: Perform CPR Yes   In the event of cardiac or respiratory ARREST: Perform Intubation/Mechanical Ventilation No   In the event of cardiac or respiratory ARREST: Use NIPPV/BiPAp only if indicated Yes   In the event of cardiac or respiratory ARREST: Administer ACLS medications if indicated Yes   In the event of cardiac or respiratory ARREST: Perform Defibrillation or Cardioversion  if indicated Yes      07/10/17 1740    Code Status History    Date Active Date Inactive Code Status Order ID Comments User Context   06/25/2017 0926 06/29/2017 1552 Partial Code [id number]  Philis Pique, NP Inpatient   06/23/2017 1024 06/25/2017 0926 Full Code [id number]  Samuella Cota,  MD ED   11/19/2016 0348 11/19/2016 1606 Full Code [id number]  Norval Morton, MD ED   10/03/2016 0055 10/04/2016 1424 Full Code [id number]  Velna Ochs, MD ED   09/24/2016 1523 09/25/2016 1410 Full Code [id number]  Maryellen Pile, MD Inpatient   08/20/2016 0350 08/22/2016 1649 Full Code [id number]  Jani Gravel, MD ED   10/14/2015 2346 10/18/2015 1346 Full Code [id number]  Zada Finders, MD Inpatient   09/24/2015 1921 09/25/2015 1809 Full Code [id number]  Dellia Nims, MD ED   07/16/2015 0647 07/16/2015 2159 Full Code [id number]  Norval Morton, MD ED   03/17/2015 1450 03/18/2015 1640 Full Code [id number]  Francesca Oman, DO ED   12/08/2011 0757 12/13/2011 1520 Full Code [id number]  Rexene Alberts, MD Inpatient   12/06/2011 1505 12/08/2011 0757 Full Code [id number]  Edwyna Perfect, RN Inpatient    Advance Directive Documentation     Most Recent Value  Type of Advance Directive  Living will, Healthcare Power of Attorney  Pre-existing out of facility DNR order (yellow form or pink MOST form)  -  "MOST" Form in Place?  -       Prognosis:   Unable to determine: poor prognosis with multiple myeloma, chronic GI bleeding secondary to AVM's requiring frequent blood transfusions, severe aortic stenosis with valvular leak, CHF, anasarca, cholecystitis s/p drain. Overall multiorgan failure and adult failure to thrive. High risk for decompensation.   Discharge Planning:  To Be Determined  Care plan was discussed with patient, wife, RN, Dr. Sherral Hammers  Thank you for allowing the Palliative Medicine Team to assist in the care of this patient.   Time In: 1640 Time Out: 1810 Total Time 57mn Prolonged Time Billed  yes       Greater than 50%  of this time was spent counseling and coordinating care related to the above assessment and plan.  MIhor Dow FNP-C Palliative Medicine Team  Phone: [phone]: [phone] Please contact Palliative Medicine Team phone at [phone] questions and concerns.

## 2017-07-13 NOTE — Progress Notes (Signed)
CRITICAL VALUE ALERT  Critical Value:  Hgb 7.9  Date & Time Notied:  6/6 at 5:55 am  Provider Notified: MD on call Baltazar Najjar)  Orders Received/Actions taken: Will wait for MD orders.

## 2017-07-13 NOTE — Plan of Care (Signed)
  Problem: Clinical Measurements: Goal: Ability to maintain clinical measurements within normal limits will improve Outcome: Progressing   Problem: Nutrition: Goal: Adequate nutrition will be maintained Outcome: Progressing   Problem: Pain Managment: Goal: General experience of comfort will improve Outcome: Progressing   Problem: Education: Goal: Knowledge of General Education information will improve Outcome: Completed/Met

## 2017-07-14 LAB — CBC
HCT: 26.2 % — ABNORMAL LOW (ref 39.0–52.0)
Hemoglobin: 8.1 g/dL — ABNORMAL LOW (ref 13.0–17.0)
MCH: 29.8 pg (ref 26.0–34.0)
MCHC: 30.9 g/dL (ref 30.0–36.0)
MCV: 96.3 fL (ref 78.0–100.0)
PLATELETS: 120 10*3/uL — AB (ref 150–400)
RBC: 2.72 MIL/uL — AB (ref 4.22–5.81)
RDW: 29.1 % — AB (ref 11.5–15.5)
WBC: 6.2 10*3/uL (ref 4.0–10.5)

## 2017-07-14 LAB — MAGNESIUM: Magnesium: 1.8 mg/dL (ref 1.7–2.4)

## 2017-07-14 MED ORDER — FENTANYL CITRATE (PF) 100 MCG/2ML IJ SOLN
12.5000 ug | INTRAMUSCULAR | Status: DC | PRN
  Administered 2017-07-14 – 2017-07-15 (×3): 25 ug via INTRAVENOUS
  Filled 2017-07-14 (×4): qty 2

## 2017-07-14 MED ORDER — OXYCODONE HCL 5 MG PO TABS
5.0000 mg | ORAL_TABLET | ORAL | Status: DC | PRN
  Administered 2017-07-14 – 2017-07-17 (×10): 10 mg via ORAL
  Filled 2017-07-14 (×10): qty 2

## 2017-07-14 MED ORDER — FENTANYL 12 MCG/HR TD PT72
12.5000 ug | MEDICATED_PATCH | TRANSDERMAL | Status: DC
  Administered 2017-07-14: 12.5 ug via TRANSDERMAL
  Filled 2017-07-14: qty 1

## 2017-07-14 MED ORDER — ALPRAZOLAM 0.25 MG PO TABS
0.2500 mg | ORAL_TABLET | Freq: Three times a day (TID) | ORAL | Status: DC | PRN
  Administered 2017-07-14: 0.5 mg via ORAL
  Administered 2017-07-15: 0.25 mg via ORAL
  Administered 2017-07-15 – 2017-07-17 (×4): 0.5 mg via ORAL
  Administered 2017-07-17: 0.25 mg via ORAL
  Filled 2017-07-14: qty 2
  Filled 2017-07-14: qty 1
  Filled 2017-07-14 (×5): qty 2
  Filled 2017-07-14: qty 1

## 2017-07-14 NOTE — Progress Notes (Signed)
Spoke to Dr. Thereasa Solo re: FU GOC discussion with Mr. Gola. He asked given pt's acute anxiety when addressing GOC on previous occasions, that we hold on further discussions. He will re consult if needed. Thank you, Romona Curls, ANP

## 2017-07-14 NOTE — Progress Notes (Signed)
Notified by NT that pt refused to stand for a weight this am.  Spoke with patient to emphasize the importance of obtaining a standing daily weight to his plan of care.  Pt still refuses to stand for a weight at this time stating he "just doesn't feel well enough to stand."

## 2017-07-14 NOTE — Care Management Important Message (Signed)
Important Message  Patient Details  Name: [name] MRN: [id number] Date of Birth: 1945-10-25   Medicare Important Message Given:  Yes    Breindel Collier P Cedar 07/14/2017, 3:35 PM

## 2017-07-14 NOTE — Progress Notes (Signed)
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  After d/c he received 1 unit of blood at the cancer center on 5/29, and 2 units on 5/31.  He reported to the [facility] ED c/o progressive weakness and swelling of his lower extremities and abdominal wall.  In the ED he was found to be hypotensive with systolics in the mid 84O, hemoglobin 7.7, and bilirubin 7.5.  Significant Events: 6/3 admit  6/4 IR replaced cholecystostomy tube  Subjective: The patient complains of severely uncontrolled low back pain.  He reports that nothing we have given him to this point has made any significant improvement in his pain.  He reports low-grade shortness of breath and diffuse body aches.  He reports a poor appetite.  He does not feel that his diffuse swelling has improved to any significant extent.  Assessment & Plan:  Acute on chronic anemia with Hypotension multifactorial anemia (chronic GI blood loss/AVMs, untreated multiple myeloma, hemolysis secondary to AVR/perivalvular leak) - gets 4 units of blood twice a week at baseline - treatment will be supportive/conservative - follow w/o further transfusion for now   Recent Labs  Lab 07/10/17 1156 07/11/17 0404 07/12/17 0443 07/13/17 0500 07/14/17 0550  HGB 7.7* 8.6* 8.7* 7.9* 8.1*    Severe aortic stenosis / AVR with perivalvular  leak ongoing hemolysis with elevated indirect bilirubin and schistocytes - valve replacement followed by redo AoVR in 2013 - 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 Tiana Loft at [facility] - off all treatments since January 2019  Acute on chronic diastolic CHF Total body volume overloaded (despite intravascular volume depletion) precipitated by anemia, worsening aortic stenosis, and third spacing due low oncotic pressure (anemia and  Hypoalbuminemia) - baseline BP 90-100 per wife - I have attempted to explain to the patient and his wife that his third space volume loss and migratory significant edema will be extremely hard to treat - edema proving refractory to tx as suspected   Chronic cholecystitis Cholecystostomy tube placed in [facility] in Jan 2019 - was exchanged in May 2019 - drain has not been putting out anything for 3-4 days since a fall - IR has replaced drain   DM2 CBG controlled   Goals of Care Extremely complex patient with very poor prognosis and multisystem issues - pt and wife agreeable to no intubation/partial code but otherwise desire ongoing aggressive care - I do not feel they are grasping the desperate nature of his current condition   DVT prophylaxis: SCDs Code Status: DO NOT INTUBATE but otherwise all other interventions allowed  Family Communication: spoke w/ wife at bedside  Disposition Plan: SDU  Consultants:  IR Palliative Care   Antimicrobials:  none   Objective: Blood pressure 122/76,  pulse 80, temperature 98.7 F (37.1 C), temperature source Axillary, resp. rate (!) 21, height 5' 9" (1.753 m), weight 86.9 kg (191 lb 8 oz), SpO2 100 %.  Intake/Output Summary (Last 24 hours) at 07/14/2017 1724 Last data filed at 07/14/2017 1307 Gross per 24 hour  Intake 1015 ml  Output 100 ml  Net 915  ml   Filed Weights   07/12/17 0500 07/13/17 0634 07/14/17 0900  Weight: 85.7 kg (188 lb 14.4 oz) 86.2 kg (190 lb 1.6 oz) 86.9 kg (191 lb 8 oz)    Examination: General: No acute respiratory distress - alert - appears to be in pain  Lungs: poor air movement bilateral bases w/o change  Cardiovascular: 4/6 holosystolic murmur transmitted across entire chest w/o change  Abdomen: protuberant, soft, fluid wave, no rebound, mildly tender diffusely GU:  3+ edema of penis and scrotum - foley cath in place - no skin tear or bleeding  Extremities: 2+ doughy B LE edema w/o change   CBC: Recent Labs  Lab 07/10/17 1156  07/12/17 0443 07/13/17 0500 07/14/17 0550  WBC 8.5   < > 7.6 6.6 6.2  NEUTROABS 7.6  --   --   --   --   HGB 7.7*   < > 8.7* 7.9* 8.1*  HCT 24.6*   < > 27.5* 25.0* 26.2*  MCV 96.1   < > 94.5 95.1 96.3  PLT 117*   < > 101* 91* 120*   < > = values in this interval not displayed.   Basic Metabolic Panel: Recent Labs  Lab 07/11/17 0404 07/12/17 0443 07/13/17 0500 07/14/17 0550  NA 129* 129* 129*  --   K 3.3* 3.6 3.5  --   CL 92* 95* 94*  --   CO2 _0 --   GLUCOSE 103* 109* 99  --   BUN _1 --   CREATININE 0.59* 0.57* 0.61  --   CALCIUM 8.4* 8.6* 8.8*  --   MG  --   --  1.6* 1.8   GFR: Estimated Creatinine Clearance: 93.8 mL/min (by C-G formula based on SCr of 0.61 mg/dL).  Liver Function Tests: Recent Labs  Lab 07/10/17 1156 07/11/17 0404 07/12/17 0443  AST 64* 58* 53*  ALT 11* 8* 6*  ALKPHOS 134* 132* 123  BILITOT 7.5* 9.4* 6.6*  PROT 5.6* 5.3* 5.4*  ALBUMIN 2.8* 2.6* 2.6*   Recent Labs  Lab 07/10/17 1156  LIPASE 29    Coagulation Profile: Recent Labs  Lab 07/10/17 1156  INR 1.12   HbA1C: Hgb A1c MFr Bld  Date/Time Value Ref Range Status  11/30/2011 06:35 AM 4.3 <5.7 % Final    Comment:    (NOTE)                                                                       According to the ADA Clinical Practice Recommendations for  2011, when HbA1c is used as a screening test:  >=6.5%   Diagnostic of Diabetes Mellitus           (if abnormal result is confirmed) 5.7-6.4%   Increased risk of developing Diabetes Mellitus References:Diagnosis and Classification of Diabetes Mellitus,Diabetes PXTG,6269,48(NIOEV 1):S62-S69 and Standards of Medical Care  in         Diabetes - 2011,Diabetes Care,2011,34 (Suppl 1):S11-S61.  11/28/2011 10:56 AM 4.4 <5.7 % Final    Comment:    (NOTE)                                                                       According to the ADA Clinical Practice Recommendations for 2011, when HbA1c is used as a screening test:  >=6.5%   Diagnostic of Diabetes Mellitus           (if abnormal result is confirmed) 5.7-6.4%   Increased risk of developing Diabetes Mellitus References:Diagnosis and Classification of Diabetes Mellitus,Diabetes JFHL,4562,56(LSLHT 1):S62-S69 and Standards of Medical Care in         Diabetes - 2011,Diabetes DSKA,7681,15 (Suppl 1):S11-S61.    Scheduled Meds: . carbidopa-levodopa  1 tablet Oral TID  . feeding supplement (ENSURE ENLIVE)  237 mL Oral TID BM  . finasteride  5 mg Oral Daily  . folic acid  1 mg Oral Daily  . furosemide  20 mg Intravenous Q12H  . midodrine  10 mg Oral TID WC  . omega-3 acid ethyl esters  1 g Oral Q lunch  . pantoprazole  40 mg Oral BID  . polyethylene glycol  17 g Oral BID  . potassium chloride  20 mEq Oral Once  . simvastatin  10 mg Oral QHS  . sodium chloride flush  5 mL Intracatheter Q8H  . traZODone  50 mg Oral QHS  . vitamin B-12  500 mcg Oral BID AC    LOS: 4 days   Cherene Altes, MD [facility] Office  [phone] Pager - Text Page per Amion as per below:  On-Call/Text Page:      [email or web address]      password [email or web address]  If 7PM-7AM, please contact night-coverage [email or web address] Password [facility] 07/14/2017, 5:24 PM

## 2017-07-15 LAB — CBC
HCT: 28.6 % — ABNORMAL LOW (ref 39.0–52.0)
HEMOGLOBIN: 8.6 g/dL — AB (ref 13.0–17.0)
MCH: 29.4 pg (ref 26.0–34.0)
MCHC: 30.1 g/dL (ref 30.0–36.0)
MCV: 97.6 fL (ref 78.0–100.0)
PLATELETS: 118 10*3/uL — AB (ref 150–400)
RBC: 2.93 MIL/uL — ABNORMAL LOW (ref 4.22–5.81)
RDW: 28.2 % — ABNORMAL HIGH (ref 11.5–15.5)
WBC: 7 10*3/uL (ref 4.0–10.5)

## 2017-07-15 LAB — COMPREHENSIVE METABOLIC PANEL
ALK PHOS: 119 U/L (ref 38–126)
ALT: 9 U/L — ABNORMAL LOW (ref 17–63)
ANION GAP: 8 (ref 5–15)
AST: 55 U/L — ABNORMAL HIGH (ref 15–41)
Albumin: 2.8 g/dL — ABNORMAL LOW (ref 3.5–5.0)
BUN: 15 mg/dL (ref 6–20)
CALCIUM: 9.5 mg/dL (ref 8.9–10.3)
CHLORIDE: 90 mmol/L — AB (ref 101–111)
CO2: 30 mmol/L (ref 22–32)
Creatinine, Ser: 0.59 mg/dL — ABNORMAL LOW (ref 0.61–1.24)
GFR calc non Af Amer: >60 mL/min (ref >60)
GLUCOSE: 119 mg/dL — AB (ref 65–99)
POTASSIUM: 4.2 mmol/L (ref 3.5–5.1)
SODIUM: 128 mmol/L — AB (ref 135–145)
Total Bilirubin: 5 mg/dL — ABNORMAL HIGH (ref 0.3–1.2)
Total Protein: 5.5 g/dL — ABNORMAL LOW (ref 6.5–8.1)

## 2017-07-15 MED ORDER — FUROSEMIDE 10 MG/ML IJ SOLN
40.0000 mg | Freq: Three times a day (TID) | INTRAMUSCULAR | Status: DC
  Administered 2017-07-15 – 2017-07-17 (×6): 40 mg via INTRAVENOUS
  Filled 2017-07-15 (×6): qty 4

## 2017-07-15 MED ORDER — FENTANYL CITRATE (PF) 100 MCG/2ML IJ SOLN
25.0000 ug | INTRAMUSCULAR | Status: DC | PRN
  Administered 2017-07-16: 25 ug via INTRAVENOUS
  Filled 2017-07-15: qty 2

## 2017-07-15 NOTE — Plan of Care (Signed)
  Problem: Nutrition: Goal: Adequate nutrition will be maintained Outcome: Progressing   Pt. encouraged to increase PO intake

## 2017-07-15 NOTE — Progress Notes (Signed)
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  After d/c he received 1 unit of blood at the cancer center on 5/29, and 2 units on 5/31.  He reported to the [facility] ED c/o progressive weakness and swelling of his lower extremities and abdominal wall.  In the ED he was found to be hypotensive with systolics in the mid 71I, hemoglobin 7.7, and bilirubin 7.5.  Significant Events: 6/3 admit  6/4 IR replaced cholecystostomy tube  Subjective: The patient tells me his pain is still poorly controlled with frequent bouts during the day when he rate his pain at 10.  He has not noted any significant improvement in his edema but he does not feel short of breath this time.  He has essentially no appetite with very poor intake.  Assessment & Plan:  Acute on chronic anemia with Hypotension multifactorial anemia (chronic GI blood loss/AVMs, untreated multiple myeloma, hemolysis secondary to AVR/perivalvular leak) - gets 4 units of blood twice a week at baseline - treatment will be supportive/conservative - follow w/o further transfusion for now - Hgb suprisingly holding steady for now   Recent Labs  Lab 07/11/17 0404 07/12/17 0443 07/13/17 0500 07/14/17 0550 07/15/17 0554  HGB 8.6* 8.7* 7.9* 8.1* 8.6*    Severe aortic stenosis / AVR with perivalvular leak ongoing hemolysis with  elevated indirect bilirubin and schistocytes - valve replacement followed by redo AoVR in 2013 - 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 Tiana Loft at [facility] - off all treatments since January 2019  Acute on chronic diastolic CHF Total body volume overloaded (despite intravascular volume depletion) precipitated by anemia, worsening aortic stenosis, and third spacing due low oncotic pressure (anemia and  Hypoalbuminemia) - baseline BP 90-100 per wife - I have attempted to explain to the patient and his wife that his third space volume loss and migratory significant edema will be extremely hard to treat - edema proving refractory to tx as suspected - cont diuresis attempts as long as BP will allow   Chronic cholecystitis Cholecystostomy tube placed in [facility] in Jan 2019 - was exchanged in May 2019 - drain has not been putting out anything for 3-4 days since a fall - IR has replaced drain this admit    DM2 Very limited intake - check A1c in AM  Goals of Care Extremely complex patient with very poor prognosis and multisystem issues - pt and wife agreeable to no intubation/partial code but otherwise desire ongoing aggressive care - I do not feel they are grasping the desperate nature of his current condition   DVT prophylaxis: SCDs Code Status: DO NOT INTUBATE but otherwise all other interventions allowed  Family Communication: spoke w/ wife at bedside  Disposition Plan:  SDU  Consultants:  IR Palliative Care   Antimicrobials:  none   Objective: Blood pressure (!) 95/59, pulse 73, temperature (!) 97.5 F (36.4 C), temperature source Axillary, resp. rate 10, height 5' 9" (1.753 m), weight 86.7 kg (191 lb 3.2 oz), SpO2 96 %.  Intake/Output Summary (Last 24 hours) at 07/15/2017 1601 Last data filed at 07/15/2017  1031 Gross per 24 hour  Intake 237 ml  Output 1435 ml  Net -1198 ml   Filed Weights   07/13/17 0634 07/14/17 0900 07/15/17 0446  Weight: 86.2 kg (190 lb 1.6 oz) 86.9 kg (191 lb 8 oz) 86.7 kg (191 lb 3.2 oz)    Examination: General: No acute respiratory distress - nodding off after getting oxycodone  Lungs: poor air movement bilateral bases   Cardiovascular: 4/6 holosystolic murmur transmitted across entire chest Abdomen: protuberant, soft, no rebound, mildly tender diffusely GU:  3+ edema of penis and scrotum - foley cath in place Extremities: 2+ doughy B LE edema w/o change   CBC: Recent Labs  Lab 07/10/17 1156  07/13/17 0500 07/14/17 0550 07/15/17 0554  WBC 8.5   < > 6.6 6.2 7.0  NEUTROABS 7.6  --   --   --   --   HGB 7.7*   < > 7.9* 8.1* 8.6*  HCT 24.6*   < > 25.0* 26.2* 28.6*  MCV 96.1   < > 95.1 96.3 97.6  PLT 117*   < > 91* 120* 118*   < > = values in this interval not displayed.   Basic Metabolic Panel: Recent Labs  Lab 07/12/17 0443 07/13/17 0500 07/14/17 0550 07/15/17 0554  NA 129* 129*  --  128*  K 3.6 3.5  --  4.2  CL 95* 94*  --  90*  CO2 30 30  --  30  GLUCOSE 109* 99  --  119*  BUN 14 15  --  15  CREATININE 0.57* 0.61  --  0.59*  CALCIUM 8.6* 8.8*  --  9.5  MG  --  1.6* 1.8  --    GFR: Estimated Creatinine Clearance: 93.7 mL/min (A) (by C-G formula based on SCr of 0.59 mg/dL (L)).  Liver Function Tests: Recent Labs  Lab 07/10/17 1156 07/11/17 0404 07/12/17 0443 07/15/17 0554  AST 64* 58* 53* 55*  ALT 11* 8* 6* 9*  ALKPHOS 134* 132* 123 119  BILITOT 7.5* 9.4* 6.6* 5.0*  PROT 5.6* 5.3* 5.4* 5.5*  ALBUMIN 2.8* 2.6* 2.6* 2.8*   Recent Labs  Lab 07/10/17 1156  LIPASE 29    Coagulation Profile: Recent Labs  Lab 07/10/17 1156  INR 1.12   HbA1C: Hgb A1c MFr Bld  Date/Time Value Ref Range Status  11/30/2011 06:35 AM 4.3 <5.7 % Final    Comment:    (NOTE)                                                                        According to the ADA Clinical Practice Recommendations for 2011, when HbA1c is used as a screening test:  >=6.5%   Diagnostic of Diabetes Mellitus           (if abnormal result is confirmed) 5.7-6.4%   Increased risk of developing Diabetes  [place] References:Diagnosis and Classification of Diabetes Mellitus,Diabetes WYOV,7858,85(OYDXA 1):S62-S69 and Standards of Medical Care in         Diabetes - 2011,Diabetes JOIN,8676,72 (Suppl 1):S11-S61.  11/28/2011 10:56 AM 4.4 <5.7 % Final    Comment:    (NOTE)                                                                       According to the ADA Clinical Practice Recommendations for 2011, when HbA1c is used as a screening test:  >=6.5%   Diagnostic of Diabetes Mellitus           (if abnormal result is confirmed) 5.7-6.4%   Increased risk of developing Diabetes Mellitus References:Diagnosis and Classification of Diabetes Mellitus,Diabetes CNOB,0962,83(MOQHU 1):S62-S69 and Standards of Medical Care in         Diabetes - 2011,Diabetes TMLY,6503,54 (Suppl 1):S11-S61.    Scheduled Meds: . carbidopa-levodopa  1 tablet Oral TID  . feeding supplement (ENSURE ENLIVE)  237 mL Oral TID BM  . fentaNYL  12.5 mcg Transdermal Q72H  . finasteride  5 mg Oral Daily  . folic acid  1 mg Oral Daily  . furosemide  20 mg Intravenous Q12H  . midodrine  10 mg Oral TID WC  . omega-3 acid ethyl esters  1 g Oral Q lunch  . pantoprazole  40 mg Oral BID  . polyethylene glycol  17 g Oral BID  . potassium chloride  20 mEq Oral Once  . simvastatin  10 mg Oral QHS  . sodium chloride flush  5 mL Intracatheter Q8H  . traZODone  50 mg Oral QHS  . vitamin B-12  500 mcg Oral BID AC    LOS: 5 days   Cherene Altes, MD [facility] Office  [phone] Pager - Text Page per Amion as per below:  On-Call/Text Page:      [email or web address]      password [email or web address]  If 7PM-7AM, please contact night-coverage [email or web address] Password [email or web address] 07/15/2017, 4:01 PM

## 2017-07-16 LAB — BASIC METABOLIC PANEL
Anion gap: 9 (ref 5–15)
BUN: 19 mg/dL (ref 6–20)
CALCIUM: 9.4 mg/dL (ref 8.9–10.3)
CO2: 30 mmol/L (ref 22–32)
CREATININE: 0.59 mg/dL — AB (ref 0.61–1.24)
Chloride: 90 mmol/L — ABNORMAL LOW (ref 101–111)
GFR calc non Af Amer: >60 mL/min (ref >60)
Glucose, Bld: 137 mg/dL — ABNORMAL HIGH (ref 65–99)
Potassium: 4 mmol/L (ref 3.5–5.1)
SODIUM: 129 mmol/L — AB (ref 135–145)

## 2017-07-16 LAB — CBC
HCT: 26.4 % — ABNORMAL LOW (ref 39.0–52.0)
Hemoglobin: 8.1 g/dL — ABNORMAL LOW (ref 13.0–17.0)
MCH: 29.9 pg (ref 26.0–34.0)
MCHC: 30.7 g/dL (ref 30.0–36.0)
MCV: 97.4 fL (ref 78.0–100.0)
PLATELETS: 103 10*3/uL — AB (ref 150–400)
RBC: 2.71 MIL/uL — AB (ref 4.22–5.81)
RDW: 26.5 % — AB (ref 11.5–15.5)
WBC: 6.4 10*3/uL (ref 4.0–10.5)

## 2017-07-16 LAB — HEMOGLOBIN A1C
HEMOGLOBIN A1C: 3.6 % — AB (ref 4.8–5.6)
Mean Plasma Glucose: 56.62 mg/dL

## 2017-07-16 MED ORDER — SIMETHICONE 80 MG PO CHEW
80.0000 mg | CHEWABLE_TABLET | Freq: Four times a day (QID) | ORAL | Status: DC | PRN
  Administered 2017-07-16 – 2017-07-21 (×5): 80 mg via ORAL
  Filled 2017-07-16 (×5): qty 1

## 2017-07-16 MED ORDER — FENTANYL 25 MCG/HR TD PT72
25.0000 ug | MEDICATED_PATCH | TRANSDERMAL | Status: DC
Start: 2017-07-17 — End: 2017-07-17

## 2017-07-16 NOTE — Plan of Care (Signed)
  Problem: Health Behavior/Discharge Planning: Goal: Ability to manage health-related needs will improve Outcome: Progressing   Problem: Clinical Measurements: Goal: Ability to maintain clinical measurements within normal limits will improve Outcome: Progressing Goal: Will remain free from infection Outcome: Progressing Goal: Diagnostic test results will improve Outcome: Progressing Goal: Respiratory complications will improve Outcome: Progressing Goal: Cardiovascular complication will be avoided Outcome: Progressing   Problem: Activity: Goal: Risk for activity intolerance will decrease Outcome: Progressing   Problem: Coping: Goal: Level of anxiety will decrease Outcome: Progressing   Problem: Elimination: Goal: Will not experience complications related to urinary retention Outcome: Progressing   Problem: Pain Managment: Goal: General experience of comfort will improve Outcome: Progressing   Problem: Safety: Goal: Ability to remain free from injury will improve Outcome: Progressing   Problem: Skin Integrity: Goal: Risk for impaired skin integrity will decrease Outcome: Progressing   Problem: Nutrition: Goal: Adequate nutrition will be maintained Outcome: Not Progressing   Problem: Elimination: Goal: Will not experience complications related to bowel motility Outcome: Not Progressing

## 2017-07-16 NOTE — Progress Notes (Signed)
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  After d/c he received 1 unit of blood at the cancer center on 5/29, and 2 units on 5/31.  He reported to the [facility] ED c/o progressive weakness and swelling of his lower extremities and abdominal wall.  In the ED he was found to be hypotensive with systolics in the mid 22Q, hemoglobin 7.7, and bilirubin 7.5.  Significant Events: 6/3 admit  6/4 IR replaced cholecystostomy tube  Subjective: The patient is sedate but does awaken to my exam/voice.  He tells me his pain continues to be poorly controlled.  He denies shortness of breath nausea vomiting or diarrhea.  Assessment & Plan:  Acute on chronic anemia with Hypotension multifactorial anemia (chronic GI blood loss/AVMs, untreated multiple myeloma, hemolysis secondary to AVR/perivalvular leak) - gets 4 units of blood twice a week at baseline - treatment will be supportive/conservative - follow w/o further transfusion for now - Hgb suprisingly holding steady for now - recheck in AM   Recent Labs  Lab 07/12/17 0443 07/13/17 0500 07/14/17 0550 07/15/17 0554 07/16/17 0538  HGB 8.7* 7.9* 8.1* 8.6* 8.1*    Severe aortic stenosis / AVR with perivalvular leak ongoing hemolysis with elevated indirect bilirubin and schistocytes - valve replacement followed by redo AoVR in 2013  - 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 Tiana Loft at [facility] - off all treatments since January 2019  Acute on chronic diastolic CHF Total body volume overloaded (despite intravascular volume depletion) precipitated by anemia, worsening aortic stenosis, and third spacing due low oncotic pressure (anemia and  Hypoalbuminemia) - baseline BP 90-100 per wife - I have attempted to explain to the patient and his wife that his third space volume loss and migratory edema will be extremely hard to treat - edema proving refractory to tx as suspected - cont diuresis attempts as long as BP will allow - net + 1.8L since admit, but net - ~1L over last 24hrs w/ increased diuretic 6/8  Chronic cholecystitis Cholecystostomy tube placed at [facility] Jan 2019 - was exchanged in May 2019 - drain had not been putting out anything for 3-4 days since a fall - IR replaced drain this admit    DM2 Very limited intake - A1c 3.6 suggesting periods of hypoglycemia w/ a mean plasma glucose of 57 - stop all DM meds and liberalized diet   Goals of Care Extremely complex patient with very poor prognosis and multisystem issues - pt and wife agreeable to no intubation/partial code but otherwise desire ongoing aggressive care - I do not feel they are grasping the desperate nature of his current condition   DVT prophylaxis: SCDs Code Status: DO NOT INTUBATE but otherwise all other interventions  allowed  Family Communication: spoke w/ wife at bedside  Disposition Plan: SDU  Consultants:  IR Palliative Care   Antimicrobials:  none   Objective: Blood pressure (!) 110/50, pulse 79, temperature 97.8 F (36.6 C), temperature source Oral, resp. rate 20, height _0  (1.753 m), weight 87.5 kg (193 lb), SpO2 100 %.  Intake/Output Summary (Last 24 hours)  at 07/16/2017 1643 Last data filed at 07/16/2017 0900 Gross per 24 hour  Intake 10 ml  Output 1490 ml  Net -1480 ml   Filed Weights   07/14/17 0900 07/15/17 0446 07/16/17 0420  Weight: 86.9 kg (191 lb 8 oz) 86.7 kg (191 lb 3.2 oz) 87.5 kg (193 lb)    Examination: General: No acute respiratory distress  Lungs: poor air movement bilateral bases w/o change - no wheezing   Cardiovascular: 4/6 holosystolic murmur transmitted across entire chest - no rub  Abdomen: protuberant, soft, no rebound, mildly tender diffusely Extremities: 2+ doughy B LE edema   CBC: Recent Labs  Lab 07/10/17 1156  07/14/17 0550 07/15/17 0554 07/16/17 0538  WBC 8.5   < > 6.2 7.0 6.4  NEUTROABS 7.6  --   --   --   --   HGB 7.7*   < > 8.1* 8.6* 8.1*  HCT 24.6*   < > 26.2* 28.6* 26.4*  MCV 96.1   < > 96.3 97.6 97.4  PLT 117*   < > 120* 118* 103*   < > = values in this interval not displayed.   Basic Metabolic Panel: Recent Labs  Lab 07/13/17 0500 07/14/17 0550 07/15/17 0554 07/16/17 0538  NA 129*  --  128* 129*  K 3.5  --  4.2 4.0  CL 94*  --  90* 90*  CO2 30  --  30 30  GLUCOSE 99  --  119* 137*  BUN 15  --  15 19  CREATININE 0.61  --  0.59* 0.59*  CALCIUM 8.8*  --  9.5 9.4  MG 1.6* 1.8  --   --    GFR: Estimated Creatinine Clearance: 94.1 mL/min (A) (by C-G formula based on SCr of 0.59 mg/dL (L)).  Liver Function Tests: Recent Labs  Lab 07/10/17 1156 07/11/17 0404 07/12/17 0443 07/15/17 0554  AST 64* 58* 53* 55*  ALT 11* 8* 6* 9*  ALKPHOS 134* 132* 123 119  BILITOT 7.5* 9.4* 6.6* 5.0*  PROT 5.6* 5.3* 5.4* 5.5*  ALBUMIN 2.8* 2.6* 2.6* 2.8*   Recent Labs  Lab 07/10/17 1156  LIPASE 29    Coagulation Profile: Recent Labs  Lab 07/10/17 1156  INR 1.12   HbA1C: Hgb A1c MFr Bld  Date/Time Value Ref Range Status  07/16/2017 05:38 AM 3.6 (L) 4.8 - 5.6 % Final    Comment:    (NOTE) Pre diabetes:          5.7%-6.4% Diabetes:              >6.4% Glycemic control for    <7.0% adults with diabetes   11/30/2011 06:35 AM 4.3 <5.7 % Final    Comment:    (NOTE)  According to the ADA Clinical Practice Recommendations for 2011, when HbA1c is used as a screening test:  >=6.5%   Diagnostic of Diabetes Mellitus           (if abnormal result is confirmed) 5.7-6.4%   Increased risk of developing Diabetes Mellitus References:Diagnosis and Classification of Diabetes Mellitus,Diabetes XKGY,1856,31(SHFWY 1):S62-S69 and Standards of Medical Care in         Diabetes - 2011,Diabetes OVZC,5885,02 (Suppl 1):S11-S61.    Scheduled Meds: . carbidopa-levodopa  1 tablet Oral TID  . feeding supplement (ENSURE ENLIVE)  237 mL Oral TID BM  . fentaNYL  12.5 mcg Transdermal Q72H  . finasteride  5 mg Oral Daily  . folic acid  1 mg Oral Daily  . furosemide  40 mg Intravenous Q8H  . midodrine  10 mg Oral TID WC  . omega-3 acid ethyl esters  1 g Oral Q lunch  . pantoprazole  40 mg Oral BID  . polyethylene glycol  17 g Oral BID  . simvastatin  10 mg Oral QHS  . sodium chloride flush  5 mL Intracatheter Q8H  . traZODone  50 mg Oral QHS  . vitamin B-12  500 mcg Oral BID AC    LOS: 6 days   Cherene Altes, MD [facility] Office  [phone] Pager - Text Page per Amion as per below:  On-Call/Text Page:      [email or web address]      password [email or web address]  If 7PM-7AM, please contact night-coverage [email or web address] Password [email or web address] 07/16/2017, 4:43 PM

## 2017-07-17 LAB — BASIC METABOLIC PANEL
Anion gap: 11 (ref 5–15)
BUN: 20 mg/dL (ref 6–20)
CALCIUM: 9.5 mg/dL (ref 8.9–10.3)
CO2: 29 mmol/L (ref 22–32)
Chloride: 91 mmol/L — ABNORMAL LOW (ref 101–111)
Creatinine, Ser: 0.72 mg/dL (ref 0.61–1.24)
GFR calc Af Amer: >60 mL/min (ref >60)
GLUCOSE: 126 mg/dL — AB (ref 65–99)
POTASSIUM: 4.2 mmol/L (ref 3.5–5.1)
Sodium: 131 mmol/L — ABNORMAL LOW (ref 135–145)

## 2017-07-17 LAB — CBC
HEMATOCRIT: 28.5 % — AB (ref 39.0–52.0)
Hemoglobin: 8.7 g/dL — ABNORMAL LOW (ref 13.0–17.0)
MCH: 30.2 pg (ref 26.0–34.0)
MCHC: 30.5 g/dL (ref 30.0–36.0)
MCV: 99 fL (ref 78.0–100.0)
Platelets: 142 10*3/uL — ABNORMAL LOW (ref 150–400)
RBC: 2.88 MIL/uL — ABNORMAL LOW (ref 4.22–5.81)
RDW: 26.1 % — AB (ref 11.5–15.5)
WBC: 8.5 10*3/uL (ref 4.0–10.5)

## 2017-07-17 MED ORDER — DICLOFENAC SODIUM 1 % TD GEL
2.0000 g | Freq: Four times a day (QID) | TRANSDERMAL | Status: DC | PRN
  Administered 2017-07-18 – 2017-07-22 (×8): 2 g via TOPICAL
  Filled 2017-07-17 (×2): qty 100

## 2017-07-17 MED ORDER — FENTANYL 2500MCG IN NS 250ML (10MCG/ML) PREMIX INFUSION
25.0000 ug/h | INTRAVENOUS | Status: DC
  Administered 2017-07-17: 50 ug/h via INTRAVENOUS
  Administered 2017-07-19 (×2): 75 ug/h via INTRAVENOUS
  Administered 2017-07-21: 50 ug/h via INTRAVENOUS
  Filled 2017-07-17 (×3): qty 250

## 2017-07-17 MED ORDER — FENTANYL BOLUS VIA INFUSION
25.0000 ug | INTRAVENOUS | Status: DC | PRN
  Filled 2017-07-17: qty 25

## 2017-07-17 MED ORDER — DOCUSATE SODIUM 100 MG PO CAPS
100.0000 mg | ORAL_CAPSULE | Freq: Two times a day (BID) | ORAL | Status: DC
  Administered 2017-07-18 – 2017-07-23 (×5): 100 mg via ORAL
  Filled 2017-07-17 (×6): qty 1

## 2017-07-17 MED ORDER — PROMETHAZINE HCL 25 MG/ML IJ SOLN: 12.5000 mg | Freq: Four times a day (QID) | INTRAMUSCULAR | Status: DC | PRN

## 2017-07-17 MED ORDER — MIDODRINE HCL 5 MG PO TABS
15.0000 mg | ORAL_TABLET | Freq: Three times a day (TID) | ORAL | Status: DC
  Administered 2017-07-18 – 2017-07-23 (×13): 15 mg via ORAL
  Filled 2017-07-17 (×14): qty 3

## 2017-07-17 MED ORDER — DEXTROSE-NACL 5-0.45 % IV SOLN
INTRAVENOUS | Status: DC
  Administered 2017-07-17: 17:00:00 via INTRAVENOUS

## 2017-07-17 MED ORDER — FUROSEMIDE 10 MG/ML IJ SOLN
40.0000 mg | Freq: Two times a day (BID) | INTRAMUSCULAR | Status: DC
  Administered 2017-07-18 – 2017-07-19 (×3): 40 mg via INTRAVENOUS
  Filled 2017-07-17 (×3): qty 4

## 2017-07-17 MED ORDER — FENTANYL CITRATE (PF) 100 MCG/2ML IJ SOLN
50.0000 ug | Freq: Once | INTRAMUSCULAR | Status: AC
Start: 2017-07-17 — End: 2017-07-17
  Administered 2017-07-17: 50 ug via INTRAVENOUS

## 2017-07-17 MED ORDER — LORAZEPAM 2 MG/ML IJ SOLN
0.5000 mg | INTRAMUSCULAR | Status: DC | PRN
  Administered 2017-07-17 – 2017-07-23 (×3): 1 mg via INTRAVENOUS
  Filled 2017-07-17 (×4): qty 1

## 2017-07-17 NOTE — Progress Notes (Signed)
  [place] TEAM 1 - Stepdown/ICU TEAM  I returned to the room following my initial visit.  The pt was sedate/resting comfortably.  I had a lengthy discussion again with his wife, sharing my concern that if he were to decline further that he would likely not survive, and that under his current orders we would be forced to rush into the room and perform CPR/shocks on him as he was dying.  I expressed that in my opinion this would provide nearly zero likelihood of meaningful recovery, and would only cause him more pain as he was dying.    She states that she and her husband had a discussion last night in which he told her he did not want [place] to "do anything to him if it wouldn't lead to him having a better quality of life."  I explained that CPR and defib would not provide him with any improved quality of life, and that according to his stated wishes that I felt he should be NCB/DNR.  His wife agrees this is consistent w/ his wishes, but does wish for [place] to continue active medical tx otherwise.    Cherene Altes, MD [facility] Office  [phone] Pager - Text Page per Amion as per below:  On-Call/Text Page:      [email or web address]      password [email or web address]  If 7PM-7AM, please contact night-coverage [email or web address] Password [email or web address] 07/17/2017, 4:19 PM

## 2017-07-17 NOTE — Progress Notes (Signed)
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  After d/c he received 1 unit of blood at the cancer center on 5/29, and 2 units on 5/31.  He reported to the [facility] ED c/o progressive weakness and swelling of his lower extremities and abdominal wall.  In the ED he was found to be hypotensive with systolics in the mid 34L, hemoglobin 7.7, and bilirubin 7.5.  Significant Events: 6/3 admit  6/4 IR replaced cholecystostomy tube  Subjective: The pt is reporting severely uncontrolled pain diffusely, but worse in his low back.  He is only sleeping in short spurts th/o the day and night due to his uncontrolled pain.  Thus far our attempts at upward titatrion of his pain meds has proven fruitless.  He is eating essentially nothing, and reports zero appetite.  He is not sob and denies SSCP.  I have spoken very frankly to he and his wife.  I feel he is dying and that there is nothing else that can be offered in terms of medical care to change this outcome.  They wish to continue conservative medical care, but agree to escalating his pain control regimen to a continuous gtt in hopes of providing him with at least a few days of comfort and rest, even if heavy sedation is a result.    Assessment & Plan:  Acute on chronic anemia with Hypotension multifactorial anemia (chronic GI blood  loss/AVMs, untreated multiple myeloma, hemolysis secondary to AVR/perivalvular leak) - gets 4 units of blood twice a week at baseline - treatment will be supportive/conservative - follow w/o further transfusion for now - Hgb suprisingly holding steady   Recent Labs  Lab 07/13/17 0500 07/14/17 0550 07/15/17 0554 07/16/17 0538 07/17/17 0430  HGB 7.9* 8.1* 8.6* 8.1* 8.7*    Severe aortic stenosis / AVR with perivalvular leak ongoing hemolysis with elevated indirect bilirubin and schistocytes - valve replacement followed by redo AoVR in 2013 - 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 Tiana Loft at [facility] - off all treatments since January 2019  Acute on chronic diastolic CHF Total body volume overloaded (despite intravascular volume depletion) precipitated by anemia, worsening aortic stenosis, and third spacing due low oncotic pressure (anemia and  Hypoalbuminemia) - baseline BP 90-100 per wife - I have attempted to explain to the patient and his wife that his third space volume loss and migratory edema will be extremely hard to treat - edema proving refractory to tx as suspected - net + 600cc since admit, but net - ~2L over last 24hrs w/ increased diuretic 6/8 - cont at same dose as long as BP and renal fxn can tolerate   Chronic cholecystitis Cholecystostomy tube placed at [facility] Jan 2019 - was  exchanged in May 2019 - drain had not been putting out anything for 3-4 days since a fall - IR replaced drain this admit    DM2 Very limited intake - A1c 3.6 suggesting periods of hypoglycemia w/ a mean plasma glucose of 57 - stopped all DM meds   Goals of Care Extremely complex patient with very poor prognosis and multisystem issues - pt and wife agreeable to no intubation/partial code but otherwise desire ongoing  aggressive care - I do not feel they are grasping the desperate nature of his current condition   DVT prophylaxis: SCDs Code Status: DO NOT INTUBATE but otherwise all other interventions allowed  Family Communication: spoke w/ wife at bedside at length Disposition Plan: SDU  Consultants:  IR Palliative Care   Antimicrobials:  none   Objective: Blood pressure (!) 85/61, pulse 73, temperature 97.6 F (36.4 C), temperature source Oral, resp. rate (!) 9, height 5' 9" (1.753 m), weight 87.5 kg (193 lb), SpO2 100 %.  Intake/Output Summary (Last 24 hours) at 07/17/2017 1426 Last data filed at 07/17/2017 0451 Gross per 24 hour  Intake -  Output 1075 ml  Net -1075 ml   Filed Weights   07/15/17 0446 07/16/17 0420 07/17/17 0521  Weight: 86.7 kg (191 lb 3.2 oz) 87.5 kg (193 lb) 87.5 kg (193 lb)    Examination: General: No acute respiratory distress - appears uncomfortable Lungs: poor air movement B bases w/o change  Cardiovascular: 4/6 holosystolic murmur transmitted across entire chest Abdomen: protuberant, soft, no rebound, mildly tender diffusely, no mass  Extremities: 2+ doughy B LE edema and B UE edema w/o change   CBC: Recent Labs  Lab 07/15/17 0554 07/16/17 0538 07/17/17 0430  WBC 7.0 6.4 8.5  HGB 8.6* 8.1* 8.7*  HCT 28.6* 26.4* 28.5*  MCV 97.6 97.4 99.0  PLT 118* 103* 366*   Basic Metabolic Panel: Recent Labs  Lab 07/13/17 0500 07/14/17 0550 07/15/17 0554 07/16/17 0538 07/17/17 0430  NA 129*  --  128* 129* 131*  K 3.5  --  4.2 4.0 4.2  CL 94*  --  90* 90* 91*  CO2 30  --  _0 GLUCOSE 99  --  119* 137* 126*  BUN 15  --  _1 CREATININE 0.61  --  0.59* 0.59* 0.72  CALCIUM 8.8*  --  9.5 9.4 9.5  MG 1.6* 1.8  --   --   --    GFR: Estimated Creatinine Clearance: 94.1 mL/min (by C-G formula based on SCr of 0.72 mg/dL).  Liver Function Tests: Recent Labs  Lab 07/11/17 0404 07/12/17 0443 07/15/17 0554  AST 58* 53* 55*  ALT 8* 6* 9*  ALKPHOS  132* 123 119  BILITOT 9.4* 6.6* 5.0*  PROT 5.3* 5.4* 5.5*  ALBUMIN 2.6* 2.6* 2.8*   HbA1C: Hgb A1c MFr Bld  Date/Time Value Ref Range Status  07/16/2017 05:38 AM 3.6 (L) 4.8 - 5.6 % Final    Comment:    (NOTE) Pre diabetes:          5.7%-6.4% Diabetes:              >6.4% Glycemic control for   <7.0% adults with diabetes   11/30/2011 06:35 AM 4.3 <5.7 % Final    Comment:    (NOTE)  According to the ADA Clinical Practice Recommendations for 2011, when HbA1c is used as a screening test:  >=6.5%   Diagnostic of Diabetes Mellitus           (if abnormal result is confirmed) 5.7-6.4%   Increased risk of developing Diabetes Mellitus References:Diagnosis and Classification of Diabetes Mellitus,Diabetes ZJQB,3419,37(TKWIO 1):S62-S69 and Standards of Medical Care in         Diabetes - 2011,Diabetes XBDZ,3299,24 (Suppl 1):S11-S61.    Scheduled Meds: . carbidopa-levodopa  1 tablet Oral TID  . feeding supplement (ENSURE ENLIVE)  237 mL Oral TID BM  . fentaNYL  25 mcg Transdermal Q72H  . finasteride  5 mg Oral Daily  . folic acid  1 mg Oral Daily  . furosemide  40 mg Intravenous Q8H  . midodrine  10 mg Oral TID WC  . omega-3 acid ethyl esters  1 g Oral Q lunch  . pantoprazole  40 mg Oral BID  . polyethylene glycol  17 g Oral BID  . simvastatin  10 mg Oral QHS  . sodium chloride flush  5 mL Intracatheter Q8H  . traZODone  50 mg Oral QHS  . vitamin B-12  500 mcg Oral BID AC    LOS: 7 days   Cherene Altes, MD [facility] Office  [phone] Pager - Text Page per Amion as per below:  On-Call/Text Page:      [email or web address]      password [email or web address]  If 7PM-7AM, please contact night-coverage [email or web address] Password [email or web address] 07/17/2017, 2:26 PM

## 2017-07-17 NOTE — Care Management Note (Addendum)
Case Management Note  Patient Details  Name: [name] MRN: [id number] Date of Birth: 1946/02/04  Subjective/Objective:  Pt presented for progressive weakness and BLE Swelling. Chronic Cholecystitis Biliary Drain-replaced by IR this admission. PTA from home- pt is active with [facility] For RN, PT/OT, Aide. Pt will need resumption orders and F2F. Pt PCP at [place]. CSW is Glean Hess @ [phone]. [facility] that [facility]              Action/Plan:[facility] is aware that pt is hospitalized. Pt will need resumption orders. No further needs from CM at this time.   Expected Discharge Date:                  Expected Discharge Plan:  [place]  In-House Referral:  NA  Discharge planning Services  CM Consult  Post Acute Care Choice:  Home Health, Resumption of Svcs/PTA Provider Choice offered to:  Patient  DME Arranged:  N/A DME Agency:  NA  HH Arranged:  RN, Disease Management, PT, OT, Nurse's Aide [place] Agency:  [facility].   Status of Service:  Completed, signed off  If discussed at [place] of Stay Meetings, dates discussed:    Additional Comments:  Bethena Roys, RN 07/17/2017, 4:17 PM

## 2017-07-18 DIAGNOSIS — L8990 Pressure ulcer of unspecified site, unspecified stage: Secondary | ICD-10-CM

## 2017-07-18 LAB — BASIC METABOLIC PANEL
ANION GAP: 8 (ref 5–15)
BUN: 17 mg/dL (ref 6–20)
CHLORIDE: 94 mmol/L — AB (ref 101–111)
CO2: 29 mmol/L (ref 22–32)
Calcium: 9.3 mg/dL (ref 8.9–10.3)
Creatinine, Ser: 0.66 mg/dL (ref 0.61–1.24)
GFR calc Af Amer: >60 mL/min (ref >60)
GLUCOSE: 107 mg/dL — AB (ref 65–99)
POTASSIUM: 3.6 mmol/L (ref 3.5–5.1)
Sodium: 131 mmol/L — ABNORMAL LOW (ref 135–145)

## 2017-07-18 LAB — CBC
HEMATOCRIT: 26.1 % — AB (ref 39.0–52.0)
HEMOGLOBIN: 7.9 g/dL — AB (ref 13.0–17.0)
MCH: 29.4 pg (ref 26.0–34.0)
MCHC: 30.3 g/dL (ref 30.0–36.0)
MCV: 97 fL (ref 78.0–100.0)
Platelets: 135 10*3/uL — ABNORMAL LOW (ref 150–400)
RBC: 2.69 MIL/uL — ABNORMAL LOW (ref 4.22–5.81)
RDW: 26.4 % — AB (ref 11.5–15.5)
WBC: 7.3 10*3/uL (ref 4.0–10.5)

## 2017-07-18 NOTE — Progress Notes (Addendum)
Nutrition Follow-up  DOCUMENTATION CODES:   Severe malnutrition in context of chronic illness  INTERVENTION:    Continue to offer Ensure Enlive po TID, each supplement provides 350 kcal and 20 grams of protein  NUTRITION DIAGNOSIS:   Severe Malnutrition related to chronic illness(multiple myeloma, CAD) as evidenced by severe fat depletion, severe muscle depletion.  Ongoing  GOAL:   Patient will meet greater than or equal to 90% of their needs  Unmet  MONITOR:   PO intake, Supplement acceptance, Labs  ASSESSMENT:   71 yo male with PMH of HTN, GERD, HLD, anemia, diverticulosis, OSA, IBS, DM, severe aortic stenosis s/p valve replacement x 2, hemolysis secondary to AVR/perivalvular leak) - gets 4 units of blood twice a week at baseline, and multiple myeloma who was admitted on 6/3 with progressive weakness and swelling of BLE and abdomen.   PO intake of meals is variable; patient consuming anywhere from 0-100% of meals, mostly </= 25% of meals. He is being offered Ensure Enlive supplements TID, consuming them sometimes. Prognosis is grim per review of MD notes, however, patient is not ready for comfort care. He is now a DNR. Labs and medications reviewed. Sodium 131 (L), Glucose 107 (H) A1C 3.6 (L)--DM medications stopped   Diet Order:   Diet Order           Diet regular Room service appropriate? Yes; Fluid consistency: Thin  Diet effective now          EDUCATION NEEDS:   No education needs have been identified at this time  Skin:  Skin Assessment: Skin Integrity Issues: Skin Integrity Issues:: Stage I Stage I: sacrum & vertebral column  Last BM:  6/10  Height:   Ht Readings from Last 1 Encounters:  07/10/17 5' 9" (1.753 m)    Weight:   Wt Readings from Last 1 Encounters:  07/17/17 193 lb (87.5 kg)    Ideal Body Weight:  72.7 kg  BMI:  Body mass index is 28.5 kg/m.  Estimated Nutritional Needs:   Kcal:  2000-2200  Protein:  100-115 gm  Fluid:   1.5-2 L    Kimberly Harris, RD, LDN, CNSC Pager [phone] After Hours Pager [phone]  

## 2017-07-18 NOTE — Progress Notes (Signed)
PROGRESS NOTE    [name]  MRN:[id number] DOB: 06/23/1945 DOA: 07/10/2017 PCP: [facility]    Brief Narrative:  72-year-old male who presented with weakness and dyspnea.  He does have the significant past medical history of severe aortic stenosis status post valve replacement x2 (last in 2013), daily by paravalvular leak, and hemolysis, multiple myeloma, chronic transfusion dependent anemia, type 2 diabetes mellitus, chronic cholecystitis status post gallbladder drain, diastolic heart failure and protein calorie malnutrition.  Recently hospitalized for anemia.  Patient reported progressive weakness for the last 10 to 12 days prior to hospitalization, complicated by lower extremity and abdominal wall edema.  On initial physical examination blood pressure 88/56, heart rate 84, respiratory rate  16, oxygen saturation 96%.  Moist mucous membranes, lungs with bibasilar rales, heart S1-S2 present, rhythmic, loud systolic murmur, abdomen soft nontender, positive lower extremity edema.  His hemoglobin was 7.7, hematocrit 24.6.  Chest radiograph was hypoinflated, increased interstitial markings bilaterally, with positive cardiomegaly.  Patient was admitted to the hospital with a working diagnosis of acute symptomatic anemia complicated by acute on chronic diastolic heart failure   Assessment & Plan:   Active Problems:   Iron deficiency anemia, unspecified   S/P aortic valve replacement with bioprosthetic valve   Acute blood loss anemia   Multiple myeloma not having achieved remission (HCC)   Chronic GI bleeding   Acute on chronic diastolic congestive heart failure (HCC)   Type II diabetes mellitus with complication (HCC)   Palliative care encounter   Anemia   Cholecystitis, chronic   Generalized pain   Anxiety state   Anasarca   Pressure injury of skin   1. Symptomatic anemia due to hemolysis and multiple myeloma, transfusion dependent. Patient has been transfused 2 units  prbc since admission, hb and hct today at 7,9 and 26, will continue to follow cell count, will transfuse if hb less than 7. Will check iron panel. Patient not candidate for further invasive valvular intervention. Clinically jaundice, last bilirubin was 5 on 6/8 will follow liver panel in am. Noted schistocytes on admission, will follow smear in am, to rule out ongoing hemolysis. Will also send haptoglobin and LDH.    2. Acute on chronic diastolic heart failure. Urine output over last 24 hours 800, patient continue to be hypervolemic, will continue current dose of furosemide. Will stop IV fluids for now. Continue midodrine for blood pressure.   3. T2DM with hypoglycemia. Continue to encourage to take po, will continue insulin sliding scale for glucose cover and monitoring, capillary glucose 99, 88, 128, 196, 124, 99. Will hold on IV dextrose, to target negative fluid balance.   4. Chronic cholecystitis. No abdominal pain, positive biliary drain in place, no abdominal pain. Drain was exchanged on this admission, with output 75 ml.   5. Severe calorie protein malnutrition with anasarca. Poor prognosis, poor oral intake, will continue to encourage po intake, continue nutritional supplements. I explained patient's wife nature of edema.   DVT prophylaxis: scd  Code Status:  dnr Family Communication: I spoke with patient's wife at the bedside and all questions were addressed. I explained patient's condition and plan of care, patient's wife wishes to take patient home at discharge.   Disposition Plan:  Discharge home if hb and hct, no signs of further hemolysis.    Consultants:     Procedures:     Antimicrobials:       Subjective: Patient very weak and deconditioned, positive dyspnea and fatigue, no chest pain,   no nausea or vomiting, persistent generalized edema.   Objective: Vitals:   07/17/17 1520 07/17/17 1809 07/17/17 2216 07/18/17 0532  BP: (!) 101/52 (!) 102/54 (!) 107/59 (!) 98/55    Pulse: 77 86 77 83  Resp: 13 19 19 19  Temp: 98.2 F (36.8 C) (!) 97.5 F (36.4 C) 98.9 F (37.2 C) 98.4 F (36.9 C)  TempSrc: Axillary Axillary Axillary Axillary  SpO2:  97% 98% 97%  Weight:      Height:        Intake/Output Summary (Last 24 hours) at 07/18/2017 1030 Last data filed at 07/18/2017 0208 Gross per 24 hour  Intake 0 ml  Output 500 ml  Net -500 ml   Filed Weights   07/15/17 0446 07/16/17 0420 07/17/17 0521  Weight: 86.7 kg (191 lb 3.2 oz) 87.5 kg (193 lb) 87.5 kg (193 lb)    Examination:   General: deconditioned and ill looking appearing Neurology: somnolent but easy to arouse, respond to simple questions and follows commands.   E ENT: positive pallor and icterus, oral mucosa is dry.  Cardiovascular: No JVD. S1-S2 present, rhythmic, no gallops, rubs, or murmurs. Positive upper and lower extremity edema/ pitting and tender, significant scrotal edema.  Pulmonary: decreased breath sounds bilaterally at bases,poor air movement due to poor inspiratory effort, no wheezing, rhonchi or rales. Gastrointestinal. Abdomen distended with no organomegaly, non tender, no rebound or guarding Skin. No rashes Musculoskeletal: no joint deformities     Data Reviewed: I have personally reviewed following labs and imaging studies  CBC: Recent Labs  Lab 07/14/17 0550 07/15/17 0554 07/16/17 0538 07/17/17 0430 07/18/17 0500  WBC 6.2 7.0 6.4 8.5 7.3  HGB 8.1* 8.6* 8.1* 8.7* 7.9*  HCT 26.2* 28.6* 26.4* 28.5* 26.1*  MCV 96.3 97.6 97.4 99.0 97.0  PLT 120* 118* 103* 142* 135*   Basic Metabolic Panel: Recent Labs  Lab 07/13/17 0500 07/14/17 0550 07/15/17 0554 07/16/17 0538 07/17/17 0430 07/18/17 0500  NA 129*  --  128* 129* 131* 131*  K 3.5  --  4.2 4.0 4.2 3.6  CL 94*  --  90* 90* 91* 94*  CO2 30  --  30 30 29 29  GLUCOSE 99  --  119* 137* 126* 107*  BUN 15  --  15 19 20 17  CREATININE 0.61  --  0.59* 0.59* 0.72 0.66  CALCIUM 8.8*  --  9.5 9.4 9.5 9.3  MG 1.6*  1.8  --   --   --   --    GFR: Estimated Creatinine Clearance: 94.1 mL/min (by C-G formula based on SCr of 0.66 mg/dL). Liver Function Tests: Recent Labs  Lab 07/12/17 0443 07/15/17 0554  AST 53* 55*  ALT 6* 9*  ALKPHOS 123 119  BILITOT 6.6* 5.0*  PROT 5.4* 5.5*  ALBUMIN 2.6* 2.8*   No results for input(s): LIPASE, AMYLASE in the last 168 hours. No results for input(s): AMMONIA in the last 168 hours. Coagulation Profile: No results for input(s): INR, PROTIME in the last 168 hours. Cardiac Enzymes: No results for input(s): CKTOTAL, CKMB, CKMBINDEX, TROPONINI in the last 168 hours. BNP (last 3 results) No results for input(s): PROBNP in the last 8760 hours. HbA1C: Recent Labs    07/16/17 0538  HGBA1C 3.6*   CBG: No results for input(s): GLUCAP in the last 168 hours. Lipid Profile: No results for input(s): CHOL, HDL, LDLCALC, TRIG, CHOLHDL, LDLDIRECT in the last 72 hours. Thyroid Function Tests: No results for input(s): TSH, T4TOTAL,   FREET4, T3FREE, THYROIDAB in the last 72 hours. Anemia Panel: No results for input(s): VITAMINB12, FOLATE, FERRITIN, TIBC, IRON, RETICCTPCT in the last 72 hours.    Radiology Studies: I have reviewed all of the imaging during this hospital visit personally     Scheduled Meds: . carbidopa-levodopa  1 tablet Oral TID  . docusate sodium  100 mg Oral BID  . feeding supplement (ENSURE ENLIVE)  237 mL Oral TID BM  . finasteride  5 mg Oral Daily  . furosemide  40 mg Intravenous BID  . midodrine  15 mg Oral TID WC  . pantoprazole  40 mg Oral BID  . polyethylene glycol  17 g Oral BID  . sodium chloride flush  5 mL Intracatheter Q8H  . traZODone  50 mg Oral QHS   Continuous Infusions: . dextrose 5 % and 0.45% NaCl 30 mL/hr at 07/17/17 1706  . fentaNYL infusion INTRAVENOUS 25 mcg/hr (07/17/17 1705)     LOS: 8 days        Caileen Veracruz Gerome Apley, MD [facility] Pager [phone]

## 2017-07-19 LAB — CBC WITH DIFFERENTIAL/PLATELET
BASOS ABS: 0 10*3/uL (ref 0.0–0.1)
Basophils Relative: 0 %
EOS ABS: 0 10*3/uL (ref 0.0–0.7)
Eosinophils Relative: 0 %
HEMATOCRIT: 29.2 % — AB (ref 39.0–52.0)
Hemoglobin: 8.8 g/dL — ABNORMAL LOW (ref 13.0–17.0)
Lymphocytes Relative: 7 %
Lymphs Abs: 0.7 10*3/uL (ref 0.7–4.0)
MCH: 29.6 pg (ref 26.0–34.0)
MCHC: 30.1 g/dL (ref 30.0–36.0)
MCV: 98.3 fL (ref 78.0–100.0)
MONOS PCT: 9 %
Monocytes Absolute: 0.9 10*3/uL (ref 0.1–1.0)
NEUTROS ABS: 7.9 10*3/uL — AB (ref 1.7–7.7)
Neutrophils Relative %: 84 %
Platelets: 205 10*3/uL (ref 150–400)
RBC: 2.97 MIL/uL — ABNORMAL LOW (ref 4.22–5.81)
RDW: 25.7 % — AB (ref 11.5–15.5)
WBC: 9.5 10*3/uL (ref 4.0–10.5)

## 2017-07-19 LAB — BASIC METABOLIC PANEL
Anion gap: 13 (ref 5–15)
BUN: 19 mg/dL (ref 6–20)
CALCIUM: 9.7 mg/dL (ref 8.9–10.3)
CO2: 26 mmol/L (ref 22–32)
Chloride: 92 mmol/L — ABNORMAL LOW (ref 101–111)
Creatinine, Ser: 0.74 mg/dL (ref 0.61–1.24)
GFR calc Af Amer: >60 mL/min (ref >60)
GLUCOSE: 118 mg/dL — AB (ref 65–99)
Potassium: 3.7 mmol/L (ref 3.5–5.1)
Sodium: 131 mmol/L — ABNORMAL LOW (ref 135–145)

## 2017-07-19 LAB — HEPATIC FUNCTION PANEL
ALK PHOS: 97 U/L (ref 38–126)
ALT: 7 U/L — AB (ref 17–63)
AST: 78 U/L — ABNORMAL HIGH (ref 15–41)
Albumin: 2.8 g/dL — ABNORMAL LOW (ref 3.5–5.0)
BILIRUBIN DIRECT: 3.1 mg/dL — AB (ref 0.1–0.5)
BILIRUBIN INDIRECT: 3.9 mg/dL — AB (ref 0.3–0.9)
Total Bilirubin: 7 mg/dL — ABNORMAL HIGH (ref 0.3–1.2)
Total Protein: 6.1 g/dL — ABNORMAL LOW (ref 6.5–8.1)

## 2017-07-19 LAB — HEMOGLOBIN AND HEMATOCRIT, BLOOD
HEMATOCRIT: 27.4 % — AB (ref 39.0–52.0)
HEMOGLOBIN: 8.4 g/dL — AB (ref 13.0–17.0)

## 2017-07-19 LAB — TRANSFERRIN: TRANSFERRIN: 245 mg/dL (ref 180–329)

## 2017-07-19 LAB — FERRITIN: Ferritin: 42 ng/mL (ref 24–336)

## 2017-07-19 LAB — IRON AND TIBC
Iron: 39 ug/dL — ABNORMAL LOW (ref 45–182)
Saturation Ratios: 11 % — ABNORMAL LOW (ref 17.9–39.5)
TIBC: 343 ug/dL (ref 250–450)
UIBC: 304 ug/dL

## 2017-07-19 MED ORDER — FUROSEMIDE 10 MG/ML IJ SOLN
80.0000 mg | Freq: Two times a day (BID) | INTRAMUSCULAR | Status: DC
  Administered 2017-07-19 – 2017-07-20 (×2): 80 mg via INTRAVENOUS
  Filled 2017-07-19 (×3): qty 8

## 2017-07-19 MED ORDER — FERUMOXYTOL INJECTION 510 MG/17 ML
510.0000 mg | Freq: Once | INTRAVENOUS | Status: AC
  Administered 2017-07-19: 510 mg via INTRAVENOUS
  Filled 2017-07-19: qty 17

## 2017-07-19 NOTE — Care Management Note (Signed)
Case Management Note  Patient Details  Name: [name] MRN: [id number] Date of Birth: [birth year]  Subjective/Objective:   CHF, symptomatic anemia due to hemolysis, multiple myeloma                Action/Plan: Spoke to pt's wife, [name] # [phone]. Offered choice for Home Hospice/list provided, pt agreeable [facility]. Will make referral for [facility]. Wife had concern about where she would put a hospital bed. States she does not have an area clear. Her nephew, [name] will have to move out dining room table and he works 8-5 pm everyday. And nephew lives in [place]. States she will call nephew this evening and work out a time to [employer] for DME. Will continue to follow to assist with dc.   Expected Discharge Date:                Expected Discharge Plan:  Home w Hospice Care  In-House Referral:  NA  Discharge planning Services  CM Consult  Post Acute Care Choice:  Hospice Choice offered to:  Spouse  DME Arranged:  Other see comment DME Agency:  [place]:  RN [facility] Agency:  [facility]  Status of Service:  In process, will continue to follow  If discussed at Long Length of Stay Meetings, dates discussed:    Additional Comments:  Erenest Rasher, RN 07/19/2017, 6:46 PM

## 2017-07-19 NOTE — Care Management Important Message (Signed)
Important Message  Patient Details  Name: [name] MRN: [id number] Date of Birth: Feb 13, 1945   Medicare Important Message Given:  Yes    [name] 07/19/2017, 1:00 PM

## 2017-07-19 NOTE — Progress Notes (Signed)
PROGRESS NOTE    AHMON TOSI  WRU:[id number] DOB: 11/28/1945 DOA: 07/10/2017 PCP: [facility]    Brief Narrative:  72 year old male who presented with weakness and dyspnea.  He does have the significant past medical history of severe aortic stenosis status post valve replacement x2 (last in 2013), daily by paravalvular leak, and hemolysis, multiple myeloma, chronic transfusion dependent anemia, type 2 diabetes mellitus, chronic cholecystitis status post gallbladder drain, diastolic heart failure and protein calorie malnutrition.  Recently hospitalized for anemia.  Patient reported progressive weakness for the last 10 to 12 days prior to hospitalization, complicated by lower extremity and abdominal wall edema.  On initial physical examination blood pressure 88/56, heart rate 84, respiratory rate  16, oxygen saturation 96%.  Moist mucous membranes, lungs with bibasilar rales, heart S1-S2 present, rhythmic, loud systolic murmur, abdomen soft nontender, positive lower extremity edema.  His hemoglobin was 7.7, hematocrit 24.6.  Chest radiograph was hypoinflated, increased interstitial markings bilaterally, with positive cardiomegaly.  Patient was admitted to the hospital with a working diagnosis of acute symptomatic anemia complicated by acute on chronic diastolic heart failure  Assessment & Plan:   Active Problems:   Iron deficiency anemia, unspecified   S/P aortic valve replacement with bioprosthetic valve   Acute blood loss anemia   Multiple myeloma not having achieved remission (HCC)   Chronic GI bleeding   Acute on chronic diastolic congestive heart failure (HCC)   Type II diabetes mellitus with complication (HCC)   Palliative care encounter   Anemia   Cholecystitis, chronic   Generalized pain   Anxiety state   Anasarca   Pressure injury of skin   1. Symptomatic anemia due to hemolysis and multiple myeloma, transfusion dependent/ sp 2 units prbc since admission. Iron  panel with iron deficiency, will give IV iron today, hb and hct stable with no signs of ongoing hemolysis. Will continue to monitor H&H with plan to transfuse when Hb less than 7. Anemia seems to be stable, for now.    2. Acute on chronic diastolic heart failure. Patient has a very poor prognosis due to symptomatic aortic valve stenosis, sp replacement, persistent hypervolemia, urine out put 400 over last 24 hours, will increase dose of furosemide to 80 mg po bud. Hold on IV fluids and  continue midodrine for blood pressure.   3. T2DM with hypoglycemia. Very poor oral intake will continue insulin sliding scale for glucose cover and monitoring, capillary glucose 88, 128, 196, 124, 99.   4. Chronic cholecystitis.  Drain has been exchanged on this admission, with output 225 ml over last 24 hours, patient with persistent hyperbilirubinemia, mostly indirect. Continue to encourage po intake.   5. Severe calorie protein malnutrition with anasarca. Very poor prognosis, very poor oral intake, continue to encourage po intake, continue nutritional supplements. Anasarca likely due to hypoproteinemia and decreased oncotic pressure.   DVT prophylaxis: scd  Code Status:  dnr Family Communication: I spoke with patient's sister  at the bedside and all questions were addressed.  The patient, his sister and I discussed his current condition, explained that he has reached a relative stability, no further invasive testing or aggressive medical measures.  We discussed the option of hospice and services today will offer, including measures to prevent rehospitalization and optimization of symptom management.  He agrees with home hospice, case manager has been informed, expect discharge within the next 48 hours.    Disposition Plan:  as above.   Consultants:     Procedures:  Antimicrobials:      Subjective: Patient continue to be very weak and deconditioned, no worsening dyspnea or chest pain,  continue to have very poor oral intake, no nausea or vomiting, no chest pain. Persistent generalized edema.   Objective: Vitals:   07/17/17 2216 07/18/17 0532 07/18/17 2036 07/19/17 0600  BP: (!) 107/59 (!) 98/55 128/66 97/66  Pulse: 77 83 78 91  Resp: 19 19 14 16   Temp: 98.9 F (37.2 C) 98.4 F (36.9 C) 97.6 F (36.4 C) 97.9 F (36.6 C)  TempSrc: Axillary Axillary Oral Oral  SpO2: 98% 97% 97% 98%  Weight:      Height:        Intake/Output Summary (Last 24 hours) at 07/19/2017 1133 Last data filed at 07/19/2017 0900 Gross per 24 hour  Intake 957 ml  Output 550 ml  Net 407 ml   Filed Weights   07/15/17 0446 07/16/17 0420 07/17/17 0521  Weight: 86.7 kg (191 lb 3.2 oz) 87.5 kg (193 lb) 87.5 kg (193 lb)    Examination:   General: deconditioned  Neurology: Awake and alert, non focal  E ENT: positive pallor and icterus, oral mucosa moist Cardiovascular: No JVD. S1-S2 present, rhythmic, no gallops, rubs, or murmurs. 4 extremities pitting+++/++++ edema. Pulmonary: decreased breath sounds bilaterally, adequate air movement, no wheezing, rhonchi or rales. Gastrointestinal. Abdomen distended with no organomegaly, non tender, no rebound or guarding Skin. No rashes Musculoskeletal: no joint deformities     Data Reviewed: I have personally reviewed following labs and imaging studies  CBC: Recent Labs  Lab 07/15/17 0554 07/16/17 0538 07/17/17 0430 07/18/17 0500 07/19/17 0500  WBC 7.0 6.4 8.5 7.3 9.5  NEUTROABS  --   --   --   --  7.9*  HGB 8.6* 8.1* 8.7* 7.9* 8.8*  HCT 28.6* 26.4* 28.5* 26.1* 29.2*  MCV 97.6 97.4 99.0 97.0 98.3  PLT 118* 103* 142* 135* 250   Basic Metabolic Panel: Recent Labs  Lab 07/13/17 0500 07/14/17 0550 07/15/17 0554 07/16/17 0538 07/17/17 0430 07/18/17 0500 07/19/17 0500  NA 129*  --  128* 129* 131* 131* 131*  K 3.5  --  4.2 4.0 4.2 3.6 3.7  CL 94*  --  90* 90* 91* 94* 92*  CO2 30  --  30 30 29 29 26   GLUCOSE 99  --  119* 137* 126*  107* 118*  BUN 15  --  15 19 20 17 19   CREATININE 0.61  --  0.59* 0.59* 0.72 0.66 0.74  CALCIUM 8.8*  --  9.5 9.4 9.5 9.3 9.7  MG 1.6* 1.8  --   --   --   --   --    GFR: Estimated Creatinine Clearance: 94.1 mL/min (by C-G formula based on SCr of 0.74 mg/dL). Liver Function Tests: Recent Labs  Lab 07/15/17 0554 07/19/17 0500  AST 55* 78*  ALT 9* 7*  ALKPHOS 119 97  BILITOT 5.0* 7.0*  PROT 5.5* 6.1*  ALBUMIN 2.8* 2.8*   No results for input(s): LIPASE, AMYLASE in the last 168 hours. No results for input(s): AMMONIA in the last 168 hours. Coagulation Profile: No results for input(s): INR, PROTIME in the last 168 hours. Cardiac Enzymes: No results for input(s): CKTOTAL, CKMB, CKMBINDEX, TROPONINI in the last 168 hours. BNP (last 3 results) No results for input(s): PROBNP in the last 8760 hours. HbA1C: No results for input(s): HGBA1C in the last 72 hours. CBG: No results for input(s): GLUCAP in the last 168 hours. Lipid  Profile: No results for input(s): CHOL, HDL, LDLCALC, TRIG, CHOLHDL, LDLDIRECT in the last 72 hours. Thyroid Function Tests: No results for input(s): TSH, T4TOTAL, FREET4, T3FREE, THYROIDAB in the last 72 hours. Anemia Panel: Recent Labs    07/19/17 0500  FERRITIN 42  TIBC 343  IRON 39*      Radiology Studies: I have reviewed all of the imaging during this hospital visit personally     Scheduled Meds: . carbidopa-levodopa  1 tablet Oral TID  . docusate sodium  100 mg Oral BID  . feeding supplement (ENSURE ENLIVE)  237 mL Oral TID BM  . finasteride  5 mg Oral Daily  . furosemide  40 mg Intravenous BID  . midodrine  15 mg Oral TID WC  . pantoprazole  40 mg Oral BID  . polyethylene glycol  17 g Oral BID  . sodium chloride flush  5 mL Intracatheter Q8H  . traZODone  50 mg Oral QHS   Continuous Infusions: . fentaNYL infusion INTRAVENOUS 75 mcg/hr (07/19/17 0216)     LOS: 9 days        Mauricio Gerome Apley, MD [facility] Pager [phone]

## 2017-07-19 NOTE — Plan of Care (Signed)
  Problem: Activity: Goal: Risk for activity intolerance will decrease Outcome: Not Progressing   Problem: Nutrition: Goal: Adequate nutrition will be maintained Outcome: Not Progressing   Problem: Coping: Goal: Level of anxiety will decrease Outcome: Not Progressing   

## 2017-07-20 ENCOUNTER — Inpatient Hospital Stay (HOSPITAL_COMMUNITY): Payer: Medicare Other

## 2017-07-20 DIAGNOSIS — D6489 Other specified anemias: Secondary | ICD-10-CM

## 2017-07-20 DIAGNOSIS — K811 Chronic cholecystitis: Secondary | ICD-10-CM

## 2017-07-20 DIAGNOSIS — R601 Generalized edema: Secondary | ICD-10-CM

## 2017-07-20 DIAGNOSIS — E118 Type 2 diabetes mellitus with unspecified complications: Secondary | ICD-10-CM

## 2017-07-20 DIAGNOSIS — C9000 Multiple myeloma not having achieved remission: Principal | ICD-10-CM

## 2017-07-20 DIAGNOSIS — I5033 Acute on chronic diastolic (congestive) heart failure: Secondary | ICD-10-CM

## 2017-07-20 DIAGNOSIS — R52 Pain, unspecified: Secondary | ICD-10-CM

## 2017-07-20 DIAGNOSIS — D62 Acute posthemorrhagic anemia: Secondary | ICD-10-CM

## 2017-07-20 DIAGNOSIS — D500 Iron deficiency anemia secondary to blood loss (chronic): Secondary | ICD-10-CM

## 2017-07-20 DIAGNOSIS — Z953 Presence of xenogenic heart valve: Secondary | ICD-10-CM

## 2017-07-20 LAB — HEMOGLOBIN AND HEMATOCRIT, BLOOD
HCT: 25.1 % — ABNORMAL LOW (ref 39.0–52.0)
HCT: 26.7 % — ABNORMAL LOW (ref 39.0–52.0)
HCT: 26.4 % — ABNORMAL LOW (ref 39.0–52.0)
HEMOGLOBIN: 7.6 g/dL — AB (ref 13.0–17.0)
Hemoglobin: 8 g/dL — ABNORMAL LOW (ref 13.0–17.0)
Hemoglobin: 8.1 g/dL — ABNORMAL LOW (ref 13.0–17.0)

## 2017-07-20 LAB — HAPTOGLOBIN

## 2017-07-20 NOTE — Progress Notes (Addendum)
[facility] Liaison: RN visit   Notified by [facility] of patient/family request for [facility] services at home after discharge. Chart and patient information under reviewed and approved by [facility] physician Dr. Konrad Dolores.   Writer spoke with wife, Fraser Din and patient at bedside to initiate education related to hospice philosophy, services and team approach to care. Patient and wife verbalized understanding of information given. Per discussion, plan is for discharge to home by [facility] at a date not yet determined.   Please send signed and completed DNR form home with patient/family. Patient will need prescriptions for discharge comfort medications.   DME needs have been discussed, patient currently has the following equipment in the home: W/C, walker, 3N1.  Patient/family requests the following DME for delivery to the home: hospital bed and OBT. [facility] equipment manager has been notified and will contact [facility] to arrange delivery to the home. Home address has been verified and is correct in the chart. Fraser Din  is the family member to contact to arrange time of delivery.   [facility]erral [facility] aware of the above. Please notify [facility] when patient is ready to leave the unit at discharge. (Call [phone] or [phone] after 5pm.) [facility] information and contact numbers given to Pat at time of visit. Above information shared with  [name], [facility].   Please call with any hospice related questions.   Thank you for this referral.   Farrel Gordon, RN, [facility] Liaison [phone] ? Hospital liaisons are now on [place].

## 2017-07-20 NOTE — Progress Notes (Signed)
PROGRESS NOTE    SHENOUDA GENOVA  AVW:[id number] DOB: [birth year] DOA: 07/10/2017 PCP: [facility]   Brief Narrative:  HPI On 07/10/2017 by Dr. Domenic Polite [name] is a 72 y.o. 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 10 days ago, he received 4 units of PRBC, was seen by palliative medicine, discharged back home,  after which he received 1 unit of blood at the cancer center on Wednesday and 2 units subsequently on Friday. -Patient reports progressive weakness for past few days, increase generalized swelling involving his lower extremities abdominal wall for 10-12 days. -his chronically had dark stools for a number of years and denies any change in this. 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  -Upon review of patient's chart, he has had baseline hemoglobin between 7-8 over the last several months to year -Continue to monitor CBC  Acute on chronic diastolic heart failure -Patient with poor prognosis due to symptomatic aortic valve stenosis  status post replacement -Has persistent hypervolemia and third spacing due to protein malnutrition -Continue IV Lasix 80 mg twice daily -Continue Midodrine for blood pressure support -Monitor intake and output, daily weights  Diabetes mellitus, type II with hypoglycemic episodes -Continue insulin sliding scale with CBG monitoring -Hypoglycemic episode secondary to poor oral intake  Chronic cholecystitis -Drain has been exchanged this admission.  Tube was placed at [facility] in January 2019. -Patient with persistent hyperbilirubinemia  Severe protein calorie malnutrition with anasarca -Continue nutritional supplements -Anasarca secondary to hypoproteinemia and decreased oncotic pressure  Abdominal ascites -Secondary to the above -Obtained abdominal ultrasound to assess for any drainable fluid- showed moderate to large volume ascites -patient and wife would like to think about paracentesis  Goals of care -Patient has been changed to DNR wife agrees with this  Multiple myeloma -Followed by Dr. Tiana Loft at [facility] -has been off of all treatments since Jan 2019  DVT Prophylaxis  SCDs  Code Status: DNR  Family Communication: Wife at bedside  Disposition Plan: Admitted  Consultants Interventional radiology Palliative care  Procedures  Replacement of cholecystostomy tube  Antibiotics   Anti-infectives (From admission, onward)   None      Subjective:   Rosario Jacks seen and examined today.  Complains of pain everywhere particularly in his back.  Feels his abdomen has gotten larger.  Denies current chest pain, shortness of breath, nausea or vomiting, diarrhea or constipation.  Objective:   Vitals:   07/19/17 2028 07/20/17 0514 07/20/17 0853 07/20/17 1336  BP: (!) 108/94 97/62 (!) 81/52 (!) 105/56  Pulse: 87 75  88  Resp: 18 12 12    Temp: 98.6 F (37 C) 97.6 F (36.4 C)  97.6 F (36.4 C)  TempSrc: Oral Oral  Oral  SpO2:  (!) 73%  99%  Weight:       Height:        Intake/Output Summary (Last 24 hours) at 07/20/2017 1555 Last data filed at 07/20/2017 0653 Gross per 24 hour  Intake 345.05 ml  Output 800 ml  Net -454.95 ml   Filed Weights   07/15/17 0446 07/16/17 0420 07/17/17 0521  Weight: 86.7 kg (191 lb 3.2 oz) 87.5 kg (193 lb) 87.5 kg (193 lb)    Exam  General: Well developed, chronically ill-appearing, no apparent distress  HEENT: NCAT, Scleral icterus, mucous membranes moist.   Neck: Supple  Cardiovascular: S1 S2 auscultated, 4/6 SEM, RRR.  Respiratory: Clear to auscultation bilaterally with equal chest rise  Abdomen: Protuberant, diffusely TTP, + bowel sounds  Extremities: warm dry without cyanosis clubbing. 3+ LE edema, 2+ UE edema  Neuro: AAOx3, nonfocal  Skin: Without rashes exudates or nodules, jaundice  Psych: Normal affect and demeanor    Data Reviewed: I have personally reviewed following labs and imaging studies  CBC: Recent Labs  Lab 07/15/17 0554 07/16/17 0538 07/17/17 0430 07/18/17 0500 07/19/17 0500 07/19/17 1432 07/20/17 0500 07/20/17 1242  WBC 7.0 6.4 8.5 7.3 9.5  --   --   --   NEUTROABS  --   --   --   --  7.9*  --   --   --   HGB 8.6* 8.1* 8.7* 7.9* 8.8* 8.4* 7.6* 8.0*  HCT 28.6* 26.4* 28.5* 26.1* 29.2* 27.4* 25.1* 26.7*  MCV 97.6 97.4 99.0 97.0 98.3  --   --   --   PLT 118* 103* 142* 135* 205  --   --   --    Basic Metabolic Panel: Recent Labs  Lab 07/14/17 0550 07/15/17 0554 07/16/17 0538 07/17/17 0430 07/18/17 0500 07/19/17 0500  NA  --  128* 129* 131* 131* 131*  K  --  4.2 4.0 4.2 3.6 3.7  CL  --  90* 90* 91* 94* 92*  CO2  --  30 30 29 29 26   GLUCOSE  --  119* 137* 126* 107* 118*  BUN  --  15 19 20 17 19   CREATININE  --  0.59* 0.59* 0.72 0.66 0.74  CALCIUM  --  9.5 9.4 9.5 9.3 9.7  MG 1.8  --   --   --   --   --    GFR: Estimated Creatinine Clearance: 94.1 mL/min (by C-G formula based on SCr of 0.74 mg/dL). Liver Function Tests: Recent Labs  Lab  07/15/17 0554 07/19/17 0500  AST 55* 78*  ALT 9* 7*  ALKPHOS 119 97  BILITOT 5.0* 7.0*  PROT 5.5* 6.1*  ALBUMIN 2.8* 2.8*   No results for input(s): LIPASE, AMYLASE in the last 168 hours. No results for input(s): AMMONIA in the last 168 hours. Coagulation Profile: No results for input(s): INR, PROTIME in the last 168 hours. Cardiac Enzymes: No results for input(s): CKTOTAL, CKMB, CKMBINDEX, TROPONINI in the last 168 hours. BNP (last 3 results) No results for input(s): PROBNP in the last 8760 hours. HbA1C: No results for input(s): HGBA1C in the last 72 hours. CBG: No results for input(s): GLUCAP in the last 168 hours. Lipid Profile: No results for input(s): CHOL, HDL, LDLCALC, TRIG, CHOLHDL, LDLDIRECT in the last 72 hours. Thyroid Function Tests: No results for input(s): TSH, T4TOTAL, FREET4, T3FREE, THYROIDAB in the last 72 hours. Anemia Panel: Recent Labs  07/19/17 0500  FERRITIN 42  TIBC 343  IRON 39*   Urine analysis:    Component Value Date/Time   COLORURINE AMBER (A) 06/23/2017 0215   APPEARANCEUR HAZY (A) 06/23/2017 0215   LABSPEC 1.021 06/23/2017 0215   PHURINE 5.0 06/23/2017 0215   GLUCOSEU NEGATIVE 06/23/2017 0215   GLUCOSEU NEGATIVE 04/25/2012 1108   HGBUR MODERATE (A) 06/23/2017 0215   BILIRUBINUR NEGATIVE 06/23/2017 0215   KETONESUR 5 (A) 06/23/2017 0215   PROTEINUR NEGATIVE 06/23/2017 0215   UROBILINOGEN 0.2 05/17/2012 1021   NITRITE NEGATIVE 06/23/2017 0215   LEUKOCYTESUR NEGATIVE 06/23/2017 0215   Sepsis Labs: @LABRCNTIP (procalcitonin:4,lacticidven:4)  )No results found for this or any previous visit (from the past 240 hour(s)).    Radiology Studies: US Abdomen Limited  Result Date: 07/20/2017 CLINICAL DATA:  Abdominal distention.  Evaluate for ascites EXAM: LIMITED ABDOMEN ULTRASOUND FOR ASCITES TECHNIQUE: Limited ultrasound survey for ascites was performed in all four abdominal quadrants. COMPARISON:  None. FINDINGS: Moderate to large  volume ascites noted in all 4 quadrants of the abdomen and pelvis. IMPRESSION: Moderate to large volume ascites. Electronically Signed   By: Rolm Baptise M.D.   On: 07/20/2017 10:50     Scheduled Meds: . carbidopa-levodopa  1 tablet Oral TID  . docusate sodium  100 mg Oral BID  . feeding supplement (ENSURE ENLIVE)  237 mL Oral TID BM  . finasteride  5 mg Oral Daily  . furosemide  80 mg Intravenous BID  . midodrine  15 mg Oral TID WC  . pantoprazole  40 mg Oral BID  . polyethylene glycol  17 g Oral BID  . sodium chloride flush  5 mL Intracatheter Q8H  . traZODone  50 mg Oral QHS   Continuous Infusions: . fentaNYL infusion INTRAVENOUS 50 mcg/hr (07/20/17 0653)     LOS: 10 days   Time Spent in minutes   45 minutes  Ashlin Kreps D.O. on 07/20/2017 at 3:55 PM  Between 7am to 7pm - Pager - [phone]  After 7pm go to [email or web address] - password [email or web address]  And look for the night coverage person covering for me after hours  [facility] Office  [phone]

## 2017-07-21 ENCOUNTER — Encounter (HOSPITAL_COMMUNITY): Payer: Self-pay | Admitting: Radiology

## 2017-07-21 ENCOUNTER — Inpatient Hospital Stay (HOSPITAL_COMMUNITY): Payer: Medicare Other

## 2017-07-21 DIAGNOSIS — Z515 Encounter for palliative care: Secondary | ICD-10-CM

## 2017-07-21 DIAGNOSIS — G893 Neoplasm related pain (acute) (chronic): Secondary | ICD-10-CM

## 2017-07-21 DIAGNOSIS — R188 Other ascites: Secondary | ICD-10-CM

## 2017-07-21 HISTORY — PX: IR PARACENTESIS: IMG2679

## 2017-07-21 LAB — HEMOGLOBIN AND HEMATOCRIT, BLOOD
HEMATOCRIT: 25.8 % — AB (ref 39.0–52.0)
Hemoglobin: 7.8 g/dL — ABNORMAL LOW (ref 13.0–17.0)

## 2017-07-21 MED ORDER — OXYCODONE HCL 5 MG PO TABS
5.0000 mg | ORAL_TABLET | ORAL | Status: DC | PRN
  Administered 2017-07-21 – 2017-07-23 (×4): 10 mg via ORAL
  Filled 2017-07-21 (×4): qty 2

## 2017-07-21 MED ORDER — FUROSEMIDE 10 MG/ML IJ SOLN: 40.0000 mg | Freq: Two times a day (BID) | INTRAMUSCULAR | Status: DC

## 2017-07-21 MED ORDER — FENTANYL 25 MCG/HR TD PT72
50.0000 ug | MEDICATED_PATCH | TRANSDERMAL | Status: DC
  Administered 2017-07-21: 50 ug via TRANSDERMAL
  Filled 2017-07-21: qty 2

## 2017-07-21 MED ORDER — ALBUMIN HUMAN 25 % IV SOLN
12.5000 g | Freq: Once | INTRAVENOUS | Status: AC
  Administered 2017-07-22: 12.5 g via INTRAVENOUS
  Filled 2017-07-21: qty 50

## 2017-07-21 MED ORDER — FUROSEMIDE 10 MG/ML IJ SOLN
40.0000 mg | Freq: Two times a day (BID) | INTRAMUSCULAR | Status: DC
  Administered 2017-07-21 – 2017-07-22 (×4): 40 mg via INTRAVENOUS
  Filled 2017-07-21 (×4): qty 4

## 2017-07-21 MED ORDER — PHENOL 1.4 % MT LIQD
1.0000 | OROMUCOSAL | Status: DC | PRN
  Administered 2017-07-21: 1 via OROMUCOSAL
  Filled 2017-07-21: qty 177

## 2017-07-21 MED ORDER — LIDOCAINE HCL (PF) 2 % IJ SOLN
INTRAMUSCULAR | Status: AC
Start: 2017-07-21 — End: 2017-07-22
  Filled 2017-07-21: qty 20

## 2017-07-21 MED ORDER — LIDOCAINE HCL (PF) 2 % IJ SOLN
INTRAMUSCULAR | Status: DC | PRN
  Administered 2017-07-21: 10 mL

## 2017-07-21 NOTE — Progress Notes (Signed)
PROGRESS NOTE    [name]  BZJ:[id number] DOB: [birth year] DOA: 07/10/2017 PCP: [facility]   Brief Narrative:  HPI On 07/10/2017 by Dr. Domenic Polite [name] is a 72 y.o. 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 10 days ago, he received 4 units of PRBC, was seen by palliative medicine, discharged back home,  after which he received 1 unit of blood at the cancer center on Wednesday and 2 units subsequently on Friday. -Patient reports progressive weakness for past few days, increase generalized swelling involving his lower extremities abdominal wall for 10-12 days. -his chronically had dark stools for a number of years and denies any change in this. -wife reports that he is increasingly weak, with limited mobility and mostly confined to a recliner or bed  Interim history Admitted for acute symptomatic anemia complicated by diastolic heart failure. Continues to complain of pain and swelling. Plan for paracentesis today just for comfort.  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 multiple myeloma -Patient currently transfusion dependent -Has received 2 units PRBC since admission -Anemia panel shows iron deficiency, patient was given IV iron supplementation -Hemoglobin currently 8.1 -Upon review of patient's chart, he has had baseline hemoglobin between 7-8 over the last several months to year -Continue to monitor CBC  Acute on chronic  diastolic heart failure -Patient with poor prognosis due to symptomatic aortic valve stenosis status post replacement -Has persistent hypervolemia and third spacing due to protein malnutrition -Continue IV Lasix twice daily- giving 9m BID given BP -Continue Midodrine for blood pressure support -Monitor intake and output, daily weights  Diabetes mellitus, type II with hypoglycemic episodes -Continue insulin sliding scale with CBG monitoring -Hypoglycemic episode secondary to poor oral intake  Chronic cholecystitis -Drain has been exchanged this admission.  [facility] -Patient with persistent hyperbilirubinemia  Severe protein calorie malnutrition with anasarca -Continue nutritional supplements -Anasarca secondary to hypoproteinemia and decreased oncotic pressure -Counseled patient on keeping scrotum elevated   Abdominal ascites -Secondary to the above -Obtained abdominal ultrasound to assess for any drainable fluid- showed moderate to large volume ascites -IR consulted and appreciated, Will order paracentesis today for comfort  Goals of care -Patient has been changed to DNR wife agrees with this -hospice has met with the patient and wife, understand services and aid that can be offered -Palliative care consulted and appreciated to aid with pain control and regimen for discharge  -currently on continuous fentanyl   Multiple myeloma -[facility]-has been off of all treatments since Jan 2019  DVT Prophylaxis  SCDs  Code Status: DNR  Family Communication: Wife at bedside  Disposition Plan: Admitted  Consultants Interventional radiology Palliative care  Procedures  Replacement of cholecystostomy tube  Antibiotics   Anti-infectives (From admission, onward)   None      Subjective:   [name] and examined today.  He is to complain of pain and swelling everywhere, particularly more so  bothersome  in the scrotal area.  Open to the idea of paracentesis today.  Denies current chest pain, shortness of breath, dizziness or headache. Wants to get up and walk.   Objective:   Vitals:   07/20/17 1336 07/20/17 1715 07/20/17 2116 07/21/17 0745  BP: (!) 105/56 (!) 107/58 103/64 99/65  Pulse: 88  92   Resp:   20   Temp: 97.6 F (36.4 C)  97.6 F (36.4 C)   TempSrc: Oral  Oral   SpO2: 99%  98%   Weight:      Height:        Intake/Output Summary (Last 24 hours) at 07/21/2017 1304 Last data filed at 07/21/2017 0845 Gross per 24 hour  Intake 295.58 ml  Output 1200 ml  Net -904.42 ml   Filed Weights   07/15/17 0446 07/16/17 0420 07/17/17 0521  Weight: 86.7 kg (191 lb 3.2 oz) 87.5 kg (193 lb) 87.5 kg (193 lb)   Exam  General: Well developed, likely ill-appearing, no apparent distress  HEENT: NCAT, pleural icterus mucous membranes moist.   Neck: Supple  Cardiovascular: S1 S2 auscultated, 4/6 SEM, RRR  Respiratory: Clear to auscultation bilaterally with equal chest rise  Abdomen: Protuberant, diffusely TTP, positive bowel sounds  Extremities: warm dry without cyanosis clubbing. 2+ UE Edema B/L; 3+LE edema B/l   Neuro: AAOx3, nonfocal  Skin: Without rashes exudates or nodules, jaundice  Psych: appropriate mood and affect.  Question patient's judgment and insight into his health.  Data Reviewed: I have personally reviewed following labs and imaging studies  CBC: Recent Labs  Lab 07/15/17 0554 07/16/17 0538 07/17/17 0430 07/18/17 0500 07/19/17 0500 07/19/17 1432 07/20/17 0500 07/20/17 1242 07/20/17 1742  WBC 7.0 6.4 8.5 7.3 9.5  --   --   --   --   NEUTROABS  --   --   --   --  7.9*  --   --   --   --   HGB 8.6* 8.1* 8.7* 7.9* 8.8* 8.4* 7.6* 8.0* 8.1*  HCT 28.6* 26.4* 28.5* 26.1* 29.2* 27.4* 25.1* 26.7* 26.4*  MCV 97.6 97.4 99.0 97.0 98.3  --   --   --   --   PLT 118* 103* 142* 135* 205  --   --   --   --    Basic Metabolic Panel: Recent Labs  Lab  07/15/17 0554 07/16/17 0538 07/17/17 0430 07/18/17 0500 07/19/17 0500  NA 128* 129* 131* 131* 131*  K 4.2 4.0 4.2 3.6 3.7  CL 90* 90* 91* 94* 92*  CO2 30 30 29 29 26   GLUCOSE 119* 137* 126* 107* 118*  BUN 15 19 20 17 19   CREATININE 0.59* 0.59* 0.72 0.66 0.74  CALCIUM 9.5 9.4 9.5 9.3 9.7   GFR: Estimated Creatinine Clearance: 94.1 mL/min (by C-G formula based on SCr of 0.74 mg/dL). Liver Function Tests: Recent Labs  Lab 07/15/17 0554 07/19/17 0500  AST 55* 78*  ALT 9* 7*  ALKPHOS 119 97  BILITOT 5.0* 7.0*  PROT 5.5* 6.1*  ALBUMIN 2.8* 2.8*   No results for input(s): LIPASE, AMYLASE in the last 168 hours. No results for input(s): AMMONIA in the last 168 hours. Coagulation Profile: No results for input(s): INR, PROTIME in the last 168 hours. Cardiac Enzymes: No results for input(s): CKTOTAL, CKMB, CKMBINDEX, TROPONINI in the last 168 hours. BNP (last 3 results) No results for input(s): PROBNP in the last 8760 hours. HbA1C: No results for input(s): HGBA1C in the last 72 hours.  CBG: No results for input(s): GLUCAP in the last 168 hours. Lipid Profile: No results for input(s): CHOL, HDL, LDLCALC, TRIG, CHOLHDL, LDLDIRECT in the last 72 hours. Thyroid Function Tests: No results for input(s): TSH, T4TOTAL, FREET4, T3FREE, THYROIDAB in the last 72 hours. Anemia Panel: Recent Labs    07/19/17 0500  FERRITIN 42  TIBC 343  IRON 39*   Urine analysis:    Component Value Date/Time   COLORURINE AMBER (A) 06/23/2017 0215   APPEARANCEUR HAZY (A) 06/23/2017 0215   LABSPEC 1.021 06/23/2017 0215   PHURINE 5.0 06/23/2017 0215   GLUCOSEU NEGATIVE 06/23/2017 0215   GLUCOSEU NEGATIVE 04/25/2012 1108   HGBUR MODERATE (A) 06/23/2017 0215   BILIRUBINUR NEGATIVE 06/23/2017 0215   KETONESUR 5 (A) 06/23/2017 0215   PROTEINUR NEGATIVE 06/23/2017 0215   UROBILINOGEN 0.2 05/17/2012 1021   NITRITE NEGATIVE 06/23/2017 0215   LEUKOCYTESUR NEGATIVE 06/23/2017 0215   Sepsis  Labs: @LABRCNTIP (procalcitonin:4,lacticidven:4)  )No results found for this or any previous visit (from the past 240 hour(s)).    Radiology Studies: US Abdomen Limited  Result Date: 07/20/2017 CLINICAL DATA:  Abdominal distention.  Evaluate for ascites EXAM: LIMITED ABDOMEN ULTRASOUND FOR ASCITES TECHNIQUE: Limited ultrasound survey for ascites was performed in all four abdominal quadrants. COMPARISON:  None. FINDINGS: Moderate to large volume ascites noted in all 4 quadrants of the abdomen and pelvis. IMPRESSION: Moderate to large volume ascites. Electronically Signed   By: Rolm Baptise M.D.   On: 07/20/2017 10:50     Scheduled Meds: . carbidopa-levodopa  1 tablet Oral TID  . docusate sodium  100 mg Oral BID  . feeding supplement (ENSURE ENLIVE)  237 mL Oral TID BM  . finasteride  5 mg Oral Daily  . furosemide  40 mg Intravenous BID  . midodrine  15 mg Oral TID WC  . pantoprazole  40 mg Oral BID  . polyethylene glycol  17 g Oral BID  . sodium chloride flush  5 mL Intracatheter Q8H  . traZODone  50 mg Oral QHS   Continuous Infusions: . albumin human    . fentaNYL infusion INTRAVENOUS 50 mcg/hr (07/21/17 0052)     LOS: 11 days   Time Spent in minutes   45 minutes   Keaden Gunnoe D.O. on 07/21/2017 at 1:04 PM  Between 7am to 7pm - Pager - [phone]  After 7pm go to [email or web address] - password [email or web address]  And look for the night coverage person covering for me after hours  [facility] Office  [phone]

## 2017-07-21 NOTE — Progress Notes (Signed)
New bag of Fentanyl IV hung, remainder in old bag wasted in sink, about 29mL remained per IV pump, witnessed per Ollen Gross, RN.

## 2017-07-21 NOTE — Plan of Care (Signed)
  Problem: Health Behavior/Discharge Planning: Goal: Ability to manage health-related needs will improve Outcome: Progressing   Problem: Clinical Measurements: Goal: Ability to maintain clinical measurements within normal limits will improve Outcome: Progressing Goal: Will remain free from infection Outcome: Progressing Goal: Diagnostic test results will improve Outcome: Progressing Goal: Respiratory complications will improve Outcome: Progressing Goal: Cardiovascular complication will be avoided Outcome: Progressing   Problem: Activity: Goal: Risk for activity intolerance will decrease Outcome: Progressing   Problem: Nutrition: Goal: Adequate nutrition will be maintained Outcome: Progressing   Problem: Coping: Goal: Level of anxiety will decrease Outcome: Progressing   Problem: Elimination: Goal: Will not experience complications related to bowel motility Outcome: Progressing Goal: Will not experience complications related to urinary retention Outcome: Progressing   Problem: Pain Managment: Goal: General experience of comfort will improve Outcome: Progressing   Problem: Safety: Goal: Ability to remain free from injury will improve Outcome: Progressing   Problem: Skin Integrity: Goal: Risk for impaired skin integrity will decrease Outcome: Progressing  Pt. able to rest in intervals thru out the night. Even and unlabored breathing. Telemetry monitoring in place. VS stable. No acute distress noted. Bed locked and low. Call bell within reach. Hourly rounding completed. Bedside shift report will be given to oncoming nurse.

## 2017-07-21 NOTE — Procedures (Signed)
  RLQ paracentesis  1.3 L bright yellow fluid  Tolerated well

## 2017-07-21 NOTE — Care Management (Signed)
1424 07-21-17 CM received call from [facility] Bed and Over-Bed Table to be delivered to patient on Saturday between the hours of 12:00 pm-4:00 pm. Hospice and [facility] will visit the patient once he transitions home. No further needs from CM at this time. Bethena Roys, RN,BSN Case Manager [phone]

## 2017-07-21 NOTE — Progress Notes (Signed)
[facility] Liaison: RN    Notified by [name] [facility], of patient/family request for [facility] services at home after discharge. Chart and patient information under reviewed and approved by [facility] physician Dr. Konrad Ray.   Danny Palmer, RN,  spoke with wife, [name] and patient at bedside to initiate education related to hospice philosophy, services and team approach to care on 07/20/17. Patient and wife verbalized understanding of information given. Per discussion, plan is for discharge to home by [facility] on 07/22/17 (tentatively). (Paracentesis scheduled for 07/21/17).   Please send signed and completed DNR form home with patient/family. Patient will need prescriptions for discharge comfort medications.   DME needs have been discussed, patient currently has the following equipment in the home: W/C, walker, 3N1.  Patient/family requests the following DME for delivery to the home: hospital bed and OBT. [facility] equipment manager has been notified and will contact [name] to arrange delivery to the home. Home address has been verified and is correct in the chart. [name]  is the family member to contact to arrange time of delivery.  UPDATE:  Per [facility], the equipment is to be delivered on 07/22/17 between 12pm - 4pm.  I confirmed the same information with [name], spouse.  She will either be available to receive or have someone to receive in the home during that time.     [facility] aware of the above. Please notify [facility] when patient is ready to leave the unit at discharge. (Call [phone] or [phone] after 5pm.) [facility] information and contact numbers given to [name] at time of visit. Above information shared with  [name], [facility].   Please call with any hospice related questions.   Thank you for this referral.   Danny Gunnels, RN, [name] Liaison [phone] ? Hospital liaisons are now on [place].

## 2017-07-21 NOTE — Progress Notes (Signed)
                                                                                                                                                                                                         Daily Progress Note   Patient Name: [name]       Date: 07/21/2017 DOB: 01/24/1946  Age: 71 y.o. MRN#: [id number] Attending Physician: Mikhail, Maryann, DO Primary Care Physician: [facility] Admit Date: 07/10/2017  Reason for Consultation/Follow-up: Establishing goals of care and Terminal Care  Subjective/GOC: Follow-up with patient and wife ([name]) at bedside. Patient known to me from last week. Patient is awake, alert, oriented. No acute distress. Asking for help to get to [facility] to have a BM.   Discussed symptom management regimen for preparation home with hospice. Explained role of utilizing medications to ensure relief from pain and suffering. Patient and wife understand and agree with medication regimen.   Planning for paracentesis today. Patient asking if he can receive medication to calm him down prior to procedure. Notified RN to give prn IV ativan prior to transfer downstairs for paracentesis.   Answered questions and concerns regarding hospice services at home. Emotional support and therapeutic listening provided. Patient in good spirits this afternoon and appreciative of my visit.   PMT contact information given to wife.   Length of Stay: 11  Current Medications: Scheduled Meds:  . carbidopa-levodopa  1 tablet Oral TID  . docusate sodium  100 mg Oral BID  . feeding supplement (ENSURE ENLIVE)  237 mL Oral TID BM  . fentaNYL  50 mcg Transdermal Q72H  . finasteride  5 mg Oral Daily  . furosemide  40 mg Intravenous BID  . midodrine  15 mg Oral TID WC  . pantoprazole  40 mg Oral BID  . polyethylene glycol  17 g Oral BID  . sodium chloride flush  5 mL Intracatheter Q8H  . traZODone  50 mg Oral QHS    Continuous Infusions: . albumin human    . fentaNYL  infusion INTRAVENOUS 50 mcg/hr (07/21/17 0052)    PRN Meds: acetaminophen **OR** [DISCONTINUED] acetaminophen, alum & mag hydroxide-simeth, bisacodyl, diclofenac sodium, fentaNYL, LORazepam, ondansetron **OR** ondansetron (ZOFRAN) IV, oxyCODONE, phenol, polyvinyl alcohol, promethazine, simethicone  Physical Exam  Constitutional: He is oriented to person, place, and time. He appears cachectic. He is cooperative. He appears ill.  Appears acutely/chronically ill.  HENT:  Head: Normocephalic and atraumatic.  Some temporal wasting  Cardiovascular: Normal rate.  Pulmonary/Chest: No accessory muscle usage. No tachypnea. No respiratory distress.  Abdominal:   He exhibits distension and ascites.  Perc drain  Musculoskeletal:       Right lower leg: He exhibits edema.       Left lower leg: He exhibits edema.  Neurological: He is alert and oriented to person, place, and time.  Skin: Skin is warm and dry.  Jaundice  Psychiatric: He has a normal mood and affect. His speech is normal and behavior is normal. Cognition and memory are normal.  irritable  Nursing note and vitals reviewed.          Vital Signs: BP 99/65 (BP Location: Left Arm)   Pulse 92   Temp 97.6 F (36.4 C) (Oral)   Resp 20   Ht 5' 9" (1.753 m)   Wt 87.5 kg (193 lb)   SpO2 98%   BMI 28.50 kg/m  SpO2: SpO2: 98 % O2 Device: O2 Device: Room Air O2 Flow Rate: O2 Flow Rate (L/min): 0 L/min  Intake/output summary:   Intake/Output Summary (Last 24 hours) at 07/21/2017 1350 Last data filed at 07/21/2017 0845 Gross per 24 hour  Intake 295.58 ml  Output 1200 ml  Net -904.42 ml   LBM: Last BM Date: 07/21/17 Baseline Weight: Weight: 82.1 kg (181 lb) Most recent weight: Weight: (refused)       Palliative Assessment/Data: PPS 40%   Flowsheet Rows     Most Recent Value  Intake Tab  Referral Department  -- [ED]  Unit at Time of Referral  ER  Palliative Care Primary Diagnosis  Cancer  Date Notified  07/10/17  Palliative  Care Type  Return patient Palliative Care  Reason for referral  Clarify Goals of Care  Date of Admission  07/10/17  Date first seen by Palliative Care  07/11/17  # of days Palliative referral response time  1 Day(s)  # of days IP prior to Palliative referral  0  Clinical Assessment  Psychosocial & Spiritual Assessment  Palliative Care Outcomes      Patient Active Problem List   Diagnosis Date Noted  . Pressure injury of skin 07/18/2017  . Generalized pain   . Anxiety state   . Anasarca   . Cholecystitis, chronic   . Anemia 07/10/2017  . Thrombocytopenia ([place])   . Protein-calorie malnutrition, severe 06/25/2017  . Palliative care encounter   . Palliative care by specialist   . Demand ischemia ([place]) 06/23/2017  . Hyponatremia 06/23/2017  . Acute respiratory distress 11/19/2016  . Atrial flutter ([place]) 11/19/2016  . Anemia due to chronic blood loss   . Pulmonary edema 10/02/2016  . Dehydration   . Shortness of breath 09/24/2016  . Dyspnea 08/20/2016  . Pancytopenia ([place]) 08/20/2016  . Type II diabetes mellitus with complication ([place]) 96/28/3662  . Hypoxia 08/20/2016  . CHF (congestive heart failure) ([place]) 08/20/2016  . Sepsis, unspecified organism ([place]) 03/23/2016  . Acute on chronic diastolic congestive heart failure ([place]) 03/23/2016  . Influenza A 03/23/2016  . Acute respiratory failure ([place]) 03/23/2016  . 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 Rectal bleeding   . Internal bleeding hemorrhoids   . Symptomatic anemia 07/16/2015  . Chronic GI bleeding 07/16/2015  . Hemorrhoids 07/16/2015  . Multiple myeloma not having achieved remission ([place])   . Syncope and collapse   . Acute blood loss anemia 03/17/2015  . Bruit 01/19/2015  . S/P aortic valve replacement with bioprosthetic valve 12/06/2011  . Aortic stenosis 11/30/2011  . Chronic daily headache 11/30/2011  . Iron deficiency anemia, unspecified 09/15/2011  .  IBS (irritable bowel syndrome)  09/15/2011  . Syncope 08/29/2011  . Cough syncope 10/13/2010  . HYPERLIPIDEMIA-MIXED 07/02/2008  . Essential hypertension 07/02/2008  . GERD 07/02/2008  . OBSTRUCTIVE SLEEP APNEA 01/15/2007  . ALLERGIC  RHINITIS 01/15/2007  . INSOMNIA 01/15/2007  . OSTEOARTHRITIS 12/02/2006  . BPH (benign prostatic hyperplasia) 12/02/2006  . H/O aortic valve replacement 12/02/2006    Palliative Care Assessment & Plan   Patient Profile: Mr. 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  Assessment: Multiple Myeloma not in remission Acute on chronic anemia Chronic GI blood loss with AVM's Severe aortic stenosis Perivalvular leak secondary to AVR Acute on chronic diastolic CHF Hypotension Anasarca Chronic cholecystitis s/p cholecystostomy tube T-spine fractures Chronic back pain  Recommendations/Plan:  DNR/DNI  Plan for home with hospice services, likely 6/15. [facility] following.   Palliative paracentesis scheduled for today. RN instructed to give prn ativan prior to procedure per patient/wife request.    Symptom management home recommendations:   Fentanyl 50mcg TD q72h  Oxycodone 5-10mg PO q4h prn pain/dyspnea/air hunger  Ativan 0.5-1mg PO q4h prn anxiety. Consider adding low dose scheduled ativan.   Continue scheduled Miralax and Colace  Continue Fentanyl gtt until tomorrow morning due to 12-24 hour onset of fentanyl patch.   Code Status:DNR/DNI    Code Status Orders  (From admission, onward)        Start     Ordered    07/10/17 1741  Limited resuscitation (code)  Continuous    Question Answer Comment  In the event of cardiac or respiratory ARREST: Initiate Code Blue, Call Rapid Response Yes   In the event of cardiac or respiratory ARREST: Perform CPR Yes   In the event of cardiac or respiratory ARREST: Perform Intubation/Mechanical Ventilation No   In the event of cardiac or respiratory ARREST: Use NIPPV/BiPAp only if indicated Yes   In the event of cardiac or respiratory ARREST: Administer ACLS medications if indicated Yes   In the event of cardiac or respiratory ARREST: Perform Defibrillation or Cardioversion if indicated Yes      07/10/17 1740    Code Status History    Date Active Date Inactive Code Status Order ID Comments User Context   06/25/2017 0926 06/29/2017 1552 Partial Code [id number]  Shaffer, Shae Lee N, NP Inpatient   06/23/2017 1024 06/25/2017 0926 Full Code [id number]  Goodrich, Daniel P, MD ED   11/19/2016 0348 11/19/2016 1606 Full Code [id number]  Smith, Rondell A, MD ED   10/03/2016 0055 10/04/2016 1424 Full Code [id number]  Guilloud, Carolyn, MD ED   09/24/2016 1523 09/25/2016 1410 Full Code [id number]  Boswell, Nathan, MD Inpatient   08/20/2016 0350 08/22/2016 1649 Full Code [id number]  Kim, James, MD ED   10/14/2015 2346 10/18/2015 1346 Full Code [id number]  Patel, Vishal, MD Inpatient   09/24/2015 1921 09/25/2015 1809 Full Code [id number]  Ahmed, Tasrif, MD ED   07/16/2015 0647 07/16/2015 2159 Full Code [id number]  Smith, Rondell A, MD ED   03/17/2015 1450 03/18/2015 1640 Full Code [id number]  Wilson, Alex M, DO ED   12/08/2011 0757 12/13/2011 1520   Full Code [id number]  Owen, Clarence H, MD Inpatient   12/06/2011 1505 12/08/2011 0757 Full Code [id number]  Flint, Suzanne Velna, RN Inpatient    Advance Directive Documentation     Most Recent Value  Type of Advance Directive  Living will, Healthcare Power of Attorney  Pre-existing out of facility DNR order (yellow form or pink MOST form)  -  "MOST" Form in Place?  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 Overall multiorgan failure and adult failure to thrive. High risk for decompensation.   Discharge Planning:  Home with Hospice  Care plan was discussed with patient, wife, Dr. Mikhail  Thank you for allowing the Palliative Medicine Team to assist in the care of this patient.   Time In: 1430 Time Out: 1510 Total Time 40min Prolonged Time Billed  no      Greater than 50%  of this time was spent counseling and coordinating care related to the above assessment and plan.   , FNP-C Palliative Medicine Team  Phone: [phone] Fax: [phone]  Please contact Palliative Medicine Team phone at [phone] for questions and concerns.      

## 2017-07-22 LAB — HEMOGLOBIN AND HEMATOCRIT, BLOOD
HCT: 27.8 % — ABNORMAL LOW (ref 39.0–52.0)
HEMOGLOBIN: 8.4 g/dL — AB (ref 13.0–17.0)

## 2017-07-22 LAB — GLUCOSE, CAPILLARY: GLUCOSE-CAPILLARY: 100 mg/dL — AB (ref 65–99)

## 2017-07-22 MED ORDER — SODIUM CHLORIDE 0.9% FLUSH
10.0000 mL | INTRAVENOUS | Status: DC | PRN
  Administered 2017-07-23: 10 mL
  Filled 2017-07-22: qty 40

## 2017-07-22 MED ORDER — ATROPINE SULFATE 1 % OP SOLN
2.0000 [drp] | Freq: Four times a day (QID) | OPHTHALMIC | Status: DC
  Administered 2017-07-22 – 2017-07-23 (×3): 2 [drp] via SUBLINGUAL
  Filled 2017-07-22: qty 2

## 2017-07-22 MED ORDER — SODIUM CHLORIDE 0.9% FLUSH
10.0000 mL | Freq: Two times a day (BID) | INTRAVENOUS | Status: DC
  Administered 2017-07-22: 10 mL

## 2017-07-22 NOTE — Plan of Care (Signed)

## 2017-07-22 NOTE — Progress Notes (Addendum)
[facility] Liaison: RN   Notified by [name], of patient/family request for [facility] services at home after discharge. Chart and patient information under reviewed and approvedby [facility] physician Dr. Konrad Dolores.  [name], RN,  spoke withwife, Fraser Din and patientat bedside to initiate education related to hospice philosophy, services and team approach to care on 07/20/17. Patient and wifeverbalized understanding of information given. Per discussion, plan is for discharge to home by [facility] on 07/22/17 (tentatively). (Paracentesis scheduled for 07/21/17).  Update:  Per bedside RN, patient is having periods of apnea with oxygen saturation dropping into the 60's this morning.  Patient may be transitioning.  Unsure if patient will be going home today.  Will continue to monitor.   Please send signed and completed DNR form home with patient/family. Patient will need prescriptions for discharge comfort medications.  DME needs have been discussed, patient currently has the following equipment in the home:W/C, walker, 3N1. Patient/family requests the following DME for delivery to the home: hospital bed and OBT. [facility] equipment manager has been notified and will contact [place] to arrange delivery to the home. Home address has been verified and is correct in the chart.Patis the family member to contact to arrange time of delivery.  Per [facility], the equipment is to be delivered on 07/22/17 between 12pm - 4pm.  I confirmed the same information with Fraser Din, spouse.  She will either be available to receive or have someone to receive in the home during that time.  UPDATE: Ordered O2 concentrator per bedside RN request.  The O2 concentrator will be delivered with the order placed 07/21/17 and I spoke with wife who has assured me that someone would be in the home to receive the order.    [facility] Referral Center aware of the above. Please notify [facility] when patient is ready to leave the unit at  discharge. (Call [phone] or [phone] after 5pm.) [facility] information and contact numbers given toPatat time of visit. Above information shared with  Jeanette,CMRN.  Please call with any hospice related questions.  Thank you for this referral.  Edyth Gunnels, RN, [facility] Liaison [phone] ? Hospital liaisons are now on [place].

## 2017-07-22 NOTE — Progress Notes (Signed)
PROGRESS NOTE    [name]  BTD:[id number] DOB: July 17, 1945 DOA: 07/10/2017 PCP: [facility]   Brief Narrative:  HPI On 07/10/2017 by Dr. Domenic Polite [name] is a 72 y.o. 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 10 days ago, he received 4 units of PRBC, was seen by palliative medicine, discharged back home,  after which he received 1 unit of blood at the cancer center on Wednesday and 2 units subsequently on Friday. -Patient reports progressive weakness for past few days, increase generalized swelling involving his lower extremities abdominal wall for 10-12 days. -his chronically had dark stools for a number of years and denies any change in this. -wife reports that he is increasingly weak, with limited mobility and mostly confined to a recliner or bed  Interim history Admitted for acute symptomatic anemia complicated by diastolic heart failure. Continues to complain of pain and swelling. S/p paracentesis for comfort. Today, patient apneic.  Assessment & Plan   Acute hypoxic respiratory failure -patient with apnea today and O2 saturations in the 80s -placed on supplemental O2 for comfort -will discontinue continuous fentanyl as patch was started on 6/14 -will continue to monitor -had long conversation with wife at bedside- patient may likely decline/pass sooner than thought- if he becomes worse, will transition to comfort care -discussed code status with  wife, confirmed DNR status -atropine drops ordered for secretions   Symptomatic anemia due to hemolysis and multiple myeloma -Patient currently transfusion dependent -Has received 2 units PRBC since admission -Anemia panel shows iron deficiency, patient was given IV iron supplementation -Hemoglobin currently 8.4 -Upon review of patient's chart, he has had baseline hemoglobin between 7-8 over the last several months to year -Continue to monitor CBC  Acute on chronic diastolic heart failure -Patient with poor prognosis due to symptomatic aortic valve stenosis status post replacement -Has persistent hypervolemia and third spacing due to protein malnutrition -Continue IV Lasix twice daily- giving 110m BID given BP -Continue Midodrine for blood pressure support -Monitor intake and output, daily weights  Diabetes mellitus, type II with hypoglycemic episodes -Continue insulin sliding scale with CBG monitoring -Hypoglycemic episode secondary to poor oral intake  Chronic cholecystitis -Drain has been exchanged this admission.  [facility] -Patient with persistent hyperbilirubinemia  Severe protein calorie malnutrition with anasarca -Continue nutritional supplements -Anasarca secondary to hypoproteinemia and decreased oncotic pressure -Counseled patient on keeping scrotum elevated   Abdominal ascites -Secondary to the above -Obtained abdominal ultrasound to assess for any drainable fluid- showed moderate to large volume ascites -IR consulted and appreciated, Will order paracentesis today for comfort  Goals of care -Patient has been changed to DNR wife agrees with this -hospice has met with the patient and wife, understand services and aid that can be offered -Palliative care consulted and appreciated to aid with pain control and regimen for discharge - started on duragesic patch, and oxycodone PRN  Multiple myeloma -[facility]-has been off of all treatments  since Jan 2019  DVT Prophylaxis  SCDs  Code Status: DNR  Family Communication: Wife at bedside  Disposition Plan: Admitted. Dispo pending- patient may expire this hospitalization   Consultants Interventional radiology Palliative care  Procedures  Replacement of cholecystostomy tube  Antibiotics   Anti-infectives (From admission, onward)   None      Subjective:   [name] seen and examined today. States he feels hot. Denies other symptoms such as nausea, chest pain, shortness of breath, abdominal pain, dizziness, headache.  Objective:   Vitals:   07/21/17 1618 07/21/17 1626 07/21/17 2044 07/22/17 0537  BP: (!) 87/54 (!) 93/45 95/70 (!) 94/59  Pulse:   89 77  Resp:   14 14  Temp:   98.8 F (37.1 C) 98.3 F (36.8 C)  TempSrc:   Oral Oral  SpO2:   99%   Weight:    85.1 kg (187 lb 9.8 oz)  Height:        Intake/Output Summary (Last 24 hours) at 07/22/2017 1547 Last data filed at 07/21/2017 2124 Gross per 24 hour  Intake -  Output 300 ml  Net -300 ml   Filed Weights   07/16/17 0420 07/17/17 0521 07/22/17 0537  Weight: 87.5 kg (193 lb) 87.5 kg (193 lb) 85.1 kg (187 lb 9.8 oz)   Exam  General: Well developed, chronically ill appearing,   HEENT: NCAT, scleral icterus, mucous membranes moist.   Neck: Supple  Cardiovascular: S1 S2 auscultated, 4/6 SEM, RRR  Respiratory: Apneic, irregular, diminished breath sounds  Abdomen: Soft, nontender, nondistended, + bowel sounds  Extremities: warm dry without cyanosis clubbing. Generalized edema upper/lower ext  Neuro: AAOx3, nonfocal  Psych: anxious, however appropriate  Data Reviewed: I have personally reviewed following labs and imaging studies  CBC: Recent Labs  Lab 07/16/17 0538 07/17/17 0430 07/18/17 0500 07/19/17 0500  07/20/17 0500 07/20/17 1242 07/20/17 1742 07/21/17 1235 07/22/17 0500  WBC 6.4 8.5 7.3 9.5  --   --   --   --   --   --     NEUTROABS  --   --   --  7.9*  --   --   --   --   --   --   HGB 8.1* 8.7* 7.9* 8.8*   < > 7.6* 8.0* 8.1* 7.8* 8.4*  HCT 26.4* 28.5* 26.1* 29.2*   < > 25.1* 26.7* 26.4* 25.8* 27.8*  MCV 97.4 99.0 97.0 98.3  --   --   --   --   --   --   PLT 103* 142* 135* 205  --   --   --   --   --   --    < > = values in this interval not displayed.   Basic Metabolic Panel: Recent Labs  Lab 07/16/17 0538 07/17/17 0430 07/18/17 0500 07/19/17 0500  NA 129* 131* 131* 131*  K 4.0 4.2 3.6 3.7  CL 90* 91* 94* 92*  CO2 30 29 29 26   GLUCOSE 137* 126* 107* 118*  BUN 19 20 17 19   CREATININE 0.59* 0.72 0.66 0.74  CALCIUM 9.4 9.5 9.3 9.7   GFR: Estimated Creatinine Clearance: 93 mL/min (by C-G formula based on SCr of 0.74 mg/dL). Liver Function Tests: Recent Labs  Lab 07/19/17 0500  AST 78*  ALT 7*  ALKPHOS 97  BILITOT 7.0*  PROT 6.1*  ALBUMIN 2.8*   No results for input(s): LIPASE, AMYLASE in the last 168 hours. No results for input(s): AMMONIA in  the last 168 hours. Coagulation Profile: No results for input(s): INR, PROTIME in the last 168 hours. Cardiac Enzymes: No results for input(s): CKTOTAL, CKMB, CKMBINDEX, TROPONINI in the last 168 hours. BNP (last 3 results) No results for input(s): PROBNP in the last 8760 hours. HbA1C: No results for input(s): HGBA1C in the last 72 hours. CBG: Recent Labs  Lab 07/22/17 0746  GLUCAP 100*   Lipid Profile: No results for input(s): CHOL, HDL, LDLCALC, TRIG, CHOLHDL, LDLDIRECT in the last 72 hours. Thyroid Function Tests: No results for input(s): TSH, T4TOTAL, FREET4, T3FREE, THYROIDAB in the last 72 hours. Anemia Panel: No results for input(s): VITAMINB12, FOLATE, FERRITIN, TIBC, IRON, RETICCTPCT in the last 72 hours. Urine analysis:    Component Value Date/Time   COLORURINE AMBER (A) 06/23/2017 0215   APPEARANCEUR HAZY (A) 06/23/2017 0215   LABSPEC 1.021 06/23/2017 0215   PHURINE 5.0 06/23/2017 0215   GLUCOSEU NEGATIVE 06/23/2017 0215    GLUCOSEU NEGATIVE 04/25/2012 1108   HGBUR MODERATE (A) 06/23/2017 0215   BILIRUBINUR NEGATIVE 06/23/2017 0215   KETONESUR 5 (A) 06/23/2017 0215   PROTEINUR NEGATIVE 06/23/2017 0215   UROBILINOGEN 0.2 05/17/2012 1021   NITRITE NEGATIVE 06/23/2017 0215   LEUKOCYTESUR NEGATIVE 06/23/2017 0215   Sepsis Labs: @LABRCNTIP (procalcitonin:4,lacticidven:4)  )No results found for this or any previous visit (from the past 240 hour(s)).    Radiology Studies: Ir Paracentesis  Result Date: 07/21/2017 INDICATION: Recurrent ascites EXAM: ULTRASOUND-GUIDED PARACENTESIS COMPARISON:  Previous paracentesis. MEDICATIONS: 10 cc 1% lidocaine. COMPLICATIONS: None immediate. TECHNIQUE: Informed written consent was obtained from the patient after a discussion of the risks, benefits and alternatives to treatment. A timeout was performed prior to the initiation of the procedure. Initial ultrasound scanning demonstrates a large amount of ascites within the right lower abdominal quadrant. The right lower abdomen was prepped and draped in the usual sterile fashion. 1% lidocaine with epinephrine was used for local anesthesia. Under direct ultrasound guidance, a 19 gauge, 7-cm, Yueh catheter was introduced. An ultrasound image was saved for documentation purposed. the paracentesis was performed. The catheter was removed and a dressing was applied. The patient tolerated the procedure well without immediate post procedural complication. FINDINGS: A total of approximately 1.3 liters of bright yellow fluid was removed. IMPRESSION: Successful ultrasound-guided paracentesis yielding 1.3 liters of peritoneal fluid. Read by Lavonia Drafts [facility] Electronically Signed   By: Aletta Edouard M.D.   On: 07/21/2017 16:32     Scheduled Meds: . atropine  2 drop Sublingual QID  . carbidopa-levodopa  1 tablet Oral TID  . docusate sodium  100 mg Oral BID  . feeding supplement (ENSURE ENLIVE)  237 mL Oral TID BM  . fentaNYL  50 mcg  Transdermal Q72H  . finasteride  5 mg Oral Daily  . furosemide  40 mg Intravenous BID  . midodrine  15 mg Oral TID WC  . pantoprazole  40 mg Oral BID  . polyethylene glycol  17 g Oral BID  . sodium chloride flush  10-40 mL Intracatheter Q12H  . sodium chloride flush  5 mL Intracatheter Q8H  . traZODone  50 mg Oral QHS   Continuous Infusions:    LOS: 12 days   Time Spent in minutes   45 minutes   Deundre Thong D.O. on 07/22/2017 at 3:47 PM  Between 7am to 7pm - Pager - [phone]  After 7pm go to [email or web address] - password [email or web address]  And look for the night coverage person covering for me after hours  [facility] Office  [phone]

## 2017-07-22 NOTE — Progress Notes (Signed)
Pt non-responsive with apneic episodes, desats into 70s , extremities cool with dusky nail beds, MD notified, Palliative aware.  Edward Qualia RN

## 2017-07-22 NOTE — Progress Notes (Signed)
Pt more alert and interactive with family members in room, able to eat some fruit on his dinner tray and tolerate sips of water, still drowsy at times.  Edward Qualia RN

## 2017-07-22 NOTE — Progress Notes (Signed)
PT Cancellation Note  Patient Details Name: [name] MRN: [id number] DOB: [birth year]   Cancelled Treatment:     Attempted to work with patient at 52, spoke to patients wife who is declining therapy at this time. Family present, distressed and in an emotional conversation. "we just want him to rest and be comfortable". Offered assistance in education for safe handling to decrease caregiver burden at home, pts wife states she would like [place] to check back tomorrow morning. PT will cont to follow and re-attempt.   Reinaldo Berber, PT, DPT Acute Rehab Services Pager: [phone]     Reinaldo Berber 07/22/2017, 10:29 AM

## 2017-07-23 DIAGNOSIS — G893 Neoplasm related pain (acute) (chronic): Secondary | ICD-10-CM

## 2017-07-23 DIAGNOSIS — R188 Other ascites: Secondary | ICD-10-CM

## 2017-07-23 DIAGNOSIS — K922 Gastrointestinal hemorrhage, unspecified: Secondary | ICD-10-CM

## 2017-07-23 DIAGNOSIS — F411 Generalized anxiety disorder: Secondary | ICD-10-CM

## 2017-07-23 MED ORDER — MIDODRINE HCL 5 MG PO TABS: 15.0000 mg | ORAL_TABLET | Freq: Three times a day (TID) | ORAL | 0 refills | Status: AC

## 2017-07-23 MED ORDER — OXYCODONE HCL 5 MG PO TABS: 5.0000 mg | ORAL_TABLET | ORAL | 0 refills | Status: AC | PRN

## 2017-07-23 MED ORDER — SIMETHICONE 80 MG PO CHEW: 80.0000 mg | CHEWABLE_TABLET | Freq: Four times a day (QID) | ORAL | 0 refills | Status: AC | PRN

## 2017-07-23 MED ORDER — ATROPINE SULFATE 1 % OP SOLN: 2.0000 [drp] | Freq: Four times a day (QID) | OPHTHALMIC | 12 refills | Status: AC

## 2017-07-23 MED ORDER — HEPARIN SOD (PORK) LOCK FLUSH 100 UNIT/ML IV SOLN
500.0000 [IU] | INTRAVENOUS | Status: AC | PRN
  Administered 2017-07-23: 500 [IU]

## 2017-07-23 MED ORDER — DICLOFENAC SODIUM 1 % TD GEL: 2.0000 g | Freq: Four times a day (QID) | TRANSDERMAL | 0 refills | Status: AC | PRN

## 2017-07-23 MED ORDER — BISACODYL 5 MG PO TBEC: 5.0000 mg | DELAYED_RELEASE_TABLET | Freq: Every day | ORAL | 0 refills | Status: AC | PRN

## 2017-07-23 MED ORDER — ENSURE ENLIVE PO LIQD: 237.0000 mL | Freq: Three times a day (TID) | ORAL | 0 refills | Status: AC

## 2017-07-23 MED ORDER — FENTANYL 50 MCG/HR TD PT72: 50.0000 ug | MEDICATED_PATCH | TRANSDERMAL | 0 refills | Status: AC

## 2017-07-23 MED ORDER — ALUM & MAG HYDROXIDE-SIMETH 200-200-20 MG/5ML PO SUSP: 30.0000 mL | ORAL | 0 refills | Status: AC | PRN

## 2017-07-23 NOTE — Plan of Care (Signed)
  Problem: Health Behavior/Discharge Planning: Goal: Ability to manage health-related needs will improve Outcome: Progressing   Problem: Clinical Measurements: Goal: Ability to maintain clinical measurements within normal limits will improve Outcome: Progressing Goal: Will remain free from infection Outcome: Progressing Goal: Diagnostic test results will improve Outcome: Progressing Goal: Respiratory complications will improve Outcome: Progressing Goal: Cardiovascular complication will be avoided Outcome: Progressing   Problem: Activity: Goal: Risk for activity intolerance will decrease Outcome: Progressing   Problem: Nutrition: Goal: Adequate nutrition will be maintained Outcome: Progressing   Problem: Coping: Goal: Level of anxiety will decrease Outcome: Progressing   Problem: Elimination: Goal: Will not experience complications related to bowel motility Outcome: Progressing Goal: Will not experience complications related to urinary retention Outcome: Progressing   Problem: Pain Managment: Goal: General experience of comfort will improve Outcome: Progressing   Problem: Safety: Goal: Ability to remain free from injury will improve Outcome: Progressing   Problem: Skin Integrity: Goal: Risk for impaired skin integrity will decrease Outcome: Progressing  Pt. able to rest in intervals thru out the night. Even and unlabored breathing. Telemetry monitoring in place. VS stable. No acute distress noted. Hourly rounding completed. Bed locked and low. Call bell within reach. Bedside shift report will be given to oncoming nurse.

## 2017-07-23 NOTE — Progress Notes (Signed)
[facility] Liaison:  RN  Spoke with Caryl Pina, CSW, and she will check to see if patient is tentatively going home today.  Will update note accordingly.    Thank you,  Edyth Gunnels, RN, BSN [facility] Liaison [phone]  All hospital liaisons are now on [place].

## 2017-07-23 NOTE — Progress Notes (Signed)
IV Fentanyl drip wasted. 400 mcg remained in bag. Wasted in sink. Yetta Glassman, RN witness.

## 2017-07-23 NOTE — Progress Notes (Signed)
[facility] Liaison: RN   Notified byAlesia,CMRN,of patient/family request for [facility] services at home after discharge. Chart and patient information under reviewed and approvedby [facility] physician Dr. Konrad Dolores.  Danny Palmer, RN,spoke withwife, [name] and patientat bedside to initiate education related to hospice philosophy, services and team approach to careon 07/20/17. Patient and wifeverbalized understanding of information given. Per discussion, plan is for discharge to home by [facility] 07/23/17.  [name], [facility], confirmed today.   Please send signed and completed DNR form home with patient/family. Patient will need prescriptions for discharge comfort medications.  DME needs have been discussed, patient currently has the following equipment in the home:W/C, walker, 3N1. Patient/family requests the following DME for delivery to the home: hospital bed and OBT. [facility] equipment manager has been notified and will contact [name] to arrange delivery to the home. Home address has been verified and is correct in the chart.[name] the family member to contact to arrange time of delivery.Per [facility], the equipment is to be delivered on 07/22/17 between 12pm - 4pm. I confirmed the same information with [name], spouse. She will either be available to receive or have someone to receive in the home during that time. UPDATE: Per patient wife, all equipment was delivered to her home 07/22/17.  [facility]erral [facility] aware of the above. Please notify [facility] when patient is ready to leave the unit at discharge. (Call [phone] or [phone] after 5pm.) [facility] information and contact numbers given toPatat time of visit. Above information shared with [name],CMRN.  Please call with any hospice related questions.  Thank you for this referral.  Edyth Gunnels, RN, [facility] Liaison [phone] ? Hospital liaisons are now on [place].

## 2017-07-23 NOTE — Care Management (Addendum)
D/C is planned for this afternoon.  Amy with [facility] updated and Hospice will visit tomorrow.  Wife is aware that she will fill prescriptions this afternoon and patient will d/c home via ambulance. [place] paperwork printed and given to RN with number to call [facility] when ready.

## 2017-07-23 NOTE — Discharge Instructions (Signed)
Hospice Introduction Hospice is a service that is designed to provide people who are terminally ill and their families with medical, spiritual, and psychological support. Its aim is to improve your quality of life by keeping you as alert and comfortable as possible. Who will be my providers when I begin hospice care? Hospice teams often include:  A nurse.  A doctor. The hospice doctor will be available for your care, but you can bring your regular doctor or nurse practitioner.  Social workers.  Religious leaders (such as a Clinical biochemist).  Trained volunteers.  What roles will providers play in my care? Hospice is performed by a team of health care professionals and volunteers who:  Help keep you comfortable: ? Hospice can be provided in your home or in a homelike setting. ? The hospice staff works with your family and friends to help meet your needs. ? You will enjoy the support of loved ones by receiving much of your basic care from family and friends.  Provide pain relief and manage your symptoms. The staff supply all necessary medicines and equipment.  Provide companionship when you are alone.  Allow you and your family to rest. They may do light housekeeping, prepare meals, and run errands.  Provide counseling. They will make sure your emotional, spiritual, and social needs and those of your family are being met.  Provide spiritual care: ? Spiritual care will be individualized to meet your needs and your family's needs. ? Spiritual care may involve:  Helping you look at what death means to you.  Helping you say goodbye to your family and friends.  Performing a specific religious ceremony or ritual.  When should hospice care begin? Most people who use hospice are believed to have fewer than 6 months to live.  Your family and health care providers can help you decide when hospice services should begin.  If your condition improves, you may discontinue the program.  What  should I consider before selecting a program? Most hospice programs are run by nonprofit, independent organizations. Some are affiliated with hospitals, nursing homes, or home health care agencies. Hospice programs can take place in the home or at a hospice center, hospital, or skilled nursing facility. When choosing a hospice program, ask the following questions:  What services are available to me?  What services will be offered to my loved ones?  How involved will my loved ones be?  How involved will my health care provider be?  Who makes up the hospice care team? How are they trained or screened?  How will my pain and symptoms be managed?  If my circumstances change, can the services be provided in a different setting, such as my home or in the hospital?  Is the program reviewed and licensed by the state or certified in some other way?  Where can I learn more about hospice? You can learn about existing hospice programs in your area from your health care providers. You can also read more about hospice online. The websites of the following organizations contain helpful information:  The [facility] and Palliative Care Organization [facility]).  The Hospice Association of America ([place]).  The [place].  The American Cancer Society (ACS).  Hospice Net.  This information is not intended to replace advice given to you by your health care provider. Make sure you discuss any questions you have with your health care provider. Document Released: 05/13/2003 Document Revised: 09/10/2015 Document Reviewed: 12/04/2012 Elsevier Interactive Patient Education  2017 [employer].

## 2017-07-23 NOTE — Discharge Summary (Signed)
Physician Discharge Summary  [name] XAJ:[id number] DOB: 05-Aug-1945 DOA: 07/10/2017  PCP: [name]  Admit date: 07/10/2017 Discharge date: 07/23/2017  Time spent: 45 minutes  Recommendations for Outpatient Follow-up:  Patient will be discharged to home with hospice.  Follow up with primary care physician if needed. Continue medications as prescribed.   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 10 days ago, he received 4 units of PRBC, was seen by palliative medicine, discharged back home, after which he received 1 unit of blood at the cancer center on Wednesday and 2 units subsequently on Friday. -Patient reports progressive weakness for past few days, increase generalized swelling involving his lower extremities abdominal wall for 10-12  days. -his chronically had dark stools for a number of years and denies any change in this. 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 multiple myeloma -Patient currently transfusion dependent -Has received 2 units PRBC since admission -Anemia panel shows iron deficiency, patient was given IV iron supplementation -Hemoglobin 8.4, stable -Upon review of patient's chart, he has had baseline hemoglobin between 7-8 over the last several months to year  Acute on chronic diastolic heart failure -Patient with poor prognosis due to symptomatic aortic valve stenosis status post replacement -Has persistent hypervolemia and third spacing due to protein malnutrition -was placed on IV lasix, continue oral lasix on discharge -Continue Midodrine for blood pressure support  Diabetes mellitus, type II with hypoglycemic episodes -may continue metformin at discharge   Chronic cholecystitis -Drain has been exchanged this admission.  Tube was placed at [facility] in January 2019. -Patient with persistent hyperbilirubinemia  Severe protein calorie malnutrition with anasarca -Continue nutritional supplements -Anasarca secondary to hypoproteinemia and decreased oncotic pressure -Counseled patient on keeping scrotum elevated   Abdominal ascites -improved -Secondary to the above -Obtained abdominal ultrasound to assess for any drainable fluid- showed moderate to large volume ascites -IR consulted and appreciated -s/p paracentesis, yielding 1.3L of fluid  Goals of care -Patient has  been changed to DNR wife agrees with this -hospice has met with the patient and wife, understand services and aid  that can be offered -Palliative care consulted and appreciated to aid with pain control and regimen for discharge - started on duragesic patch, and oxycodone PRN -will discharge with foley catheter for comfort -home with hospice  Multiple myeloma -Followed by Dr. Tiana Ray at [facility] -has been off of all treatments since Jan 2019  Consultants Interventional radiology Palliative care  Procedures  Replacement of cholecystostomy tube US guided paracentesis   Discharge Exam: Vitals:   07/23/17 0525 07/23/17 0540  BP: (!) 78/65 (!) 90/40  Pulse: 75   Resp: 10   Temp: 97.7 F (36.5 C)   SpO2: 99%      General: Well developed, chronically ill appearing, NAD  HEENT: NCAT, mucous membranes moist. Scleral icterus  Neck: Supple  Cardiovascular: S1 S2 auscultated, 4/6 SEM, RRR  Respiratory: periods of apnea, irregular breathing, decreased breath sounds  Abdomen: Soft, nontender, mildly distended, + bowel sounds  Extremities: warm dry without cyanosis clubbing. Generalized edema, 3+ Upper/lower ext  Discharge Instructions  Allergies as of 07/23/2017      Reactions   Avodart [dutasteride] Other (See Comments)   Lose of use of arms and legs   Spironolactone Other (See Comments)   Low tolerance    Codeine Other (See Comments)   GI upset in large doses   Doxazosin Other (See Comments)   Dizziness   Feraheme [ferumoxytol] Swelling   Pedal edema      Medication List    STOP taking these medications   morphine 15 MG tablet Commonly known as:  MSIR   traMADol 50 MG tablet Commonly known as:  ULTRAM     TAKE these medications   acetaminophen 325 MG tablet Commonly known as:  TYLENOL Take 650 mg by mouth every 6 (six) hours as needed for fever.   alum & mag hydroxide-simeth 200-200-20 MG/5ML suspension Commonly known as:   MAALOX/MYLANTA Take 30 mLs by mouth every 4 (four) hours as needed for indigestion.   atropine 1 % ophthalmic solution Place 2 drops under the tongue 4 (four) times daily.   bisacodyl 5 MG EC tablet Commonly known as:  DULCOLAX Take 1 tablet (5 mg total) by mouth daily as needed for moderate constipation or severe constipation.   carbidopa-levodopa 25-100 MG tablet Commonly known as:  SINEMET IR Take 1 tablet by mouth 3 (three) times daily.   diclofenac sodium 1 % Gel Commonly known as:  VOLTAREN Apply 2 g topically 4 (four) times daily as needed (back pain).   docusate sodium 100 MG capsule Commonly known as:  COLACE Take 100 mg by mouth 2 (two) times daily as needed for moderate constipation.   feeding supplement (ENSURE ENLIVE) Liqd Take 237 mLs by mouth 3 (three) times daily between meals.   fentaNYL 50 MCG/HR Commonly known as:  DURAGESIC - dosed mcg/hr Place 1 patch (50 mcg total) onto the skin every 3 (three) days. Start taking on:  07/24/2017   finasteride 5 MG tablet Commonly known as:  PROSCAR Take 5 mg by mouth daily.   fish oil-omega-3 fatty acids 1000 MG capsule Take 1 g by mouth daily.   folic acid 1 MG tablet Commonly known as:  FOLVITE Take 1 mg by mouth daily.   furosemide 40 MG tablet Commonly known as:  LASIX TAKE 2 TABLETS (80 MG TOTAL) BY MOUTH DAILY. TAKE A EXTRA TABLET ON DAYS OF DIALYSIS What changed:    when to take this  additional instructions   gabapentin 800 MG tablet Commonly known  as:  NEURONTIN Take 400-800 mg by mouth See admin instructions. Take 1/2 tablet every morning and at bedtime then take 1 tablet at lunch and dinner   metFORMIN 500 MG tablet Commonly known as:  GLUCOPHAGE Take 500 mg by mouth 2 (two) times daily with a meal.   midodrine 5 MG tablet Commonly known as:  PROAMATINE Take 3 tablets (15 mg total) by mouth 3 (three) times daily with meals.   ondansetron 4 MG tablet Commonly known as:  ZOFRAN Take 4 mg by  mouth every 8 (eight) hours as needed for nausea or vomiting.   oxyCODONE 5 MG immediate release tablet Commonly known as:  Oxy IR/ROXICODONE Take 1-2 tablets (5-10 mg total) by mouth every 4 (four) hours as needed (breakthrough pain/dyspnea).   pantoprazole 40 MG tablet Commonly known as:  PROTONIX Take 1 tablet (40 mg total) by mouth 2 (two) times daily. For GERD   polyethylene glycol packet Commonly known as:  MIRALAX / GLYCOLAX Take 17 g by mouth 2 (two) times daily.   polyvinyl alcohol 1.4 % ophthalmic solution Commonly known as:  LIQUIFILM TEARS Place 1 drop into both eyes 2 (two) times daily as needed for dry eyes.   REFRESH 1.4-0.6 % Soln Generic drug:  Polyvinyl Alcohol-Povidone PF Place 1 drop into both eyes 2 (two) times daily as needed (dry eyes).   simethicone 80 MG chewable tablet Commonly known as:  MYLICON Chew 1 tablet (80 mg total) by mouth 4 (four) times daily as needed (as needed for gas).   simvastatin 10 MG tablet Commonly known as:  ZOCOR Take 10 mg by mouth at bedtime.   traZODone 50 MG tablet Commonly known as:  DESYREL Take 50 mg by mouth at bedtime as needed for sleep.   UNABLE TO FIND Place 1 application into the right eye daily as needed (eye care). Ocular Ointment   vitamin B-12 500 MCG tablet Commonly known as:  CYANOCOBALAMIN Take 500 mcg by mouth 2 (two) times daily. Lunch/dinner   zinc oxide 20 % ointment Apply 1 application topically 2 (two) times daily as needed for irritation.      Allergies  Allergen Reactions  . Avodart [Dutasteride] Other (See Comments)    Lose of use of arms and legs   . Spironolactone Other (See Comments)    Low tolerance   . Codeine Other (See Comments)    GI upset in large doses  . Doxazosin Other (See Comments)    Dizziness   . Feraheme [Ferumoxytol] Swelling    Pedal edema   Follow-up Information    [facility] Follow up.   Why:  Home Hospice RN- agency will call  to arrange visit Contact information: [place] [phone]           The results of significant diagnostics from this hospitalization (including imaging, microbiology, ancillary and laboratory) are listed below for reference.    Significant Diagnostic Studies: Dg Thoracic Spine 2 View  Result Date: 07/13/2017 CLINICAL DATA:  Back pain after fall.  History of multiple myeloma. EXAM: THORACIC SPINE 2 VIEWS COMPARISON:  CT scan of October 02, 2016. FINDINGS: Mild compression deformity is noted involving lower thoracic vertebral body concerning for fracture of indeterminate age. No spondylolisthesis is noted. Anterior osteophyte formation is noted in the middle and lower thoracic spine. IMPRESSION: Mild compression deformity seen involving lower thoracic vertebral body consistent with fracture of indeterminate age. Further evaluation with MRI is recommended.  Electronically Signed   By: Marijo Conception, M.D.   On: 07/13/2017 09:48   Dg Lumbar Spine 2-3 Views  Result Date: 07/13/2017 CLINICAL DATA:  Low back pain after fall. History of multiple myeloma. EXAM: LUMBAR SPINE - 2-3 VIEW COMPARISON:  Radiographs of September 22, 2013. FINDINGS: No fracture or spondylolisthesis is noted. Disc spaces are well-maintained. Minimal osteophyte formation is noted at L1-2 and L4-5. Diffuse osteopenia is noted. IMPRESSION: No acute abnormality seen in the lumbar spine. Electronically Signed   By: Marijo Conception, M.D.   On: 07/13/2017 09:45   Mr Brain Wo Contrast  Result Date: 06/27/2017 CLINICAL DATA:  72 y/o  M; diplopia, hypotension, severe anemia. EXAM: MRI HEAD WITHOUT CONTRAST TECHNIQUE: Multiplanar, multiecho pulse sequences of the brain and surrounding structures were obtained without intravenous contrast. COMPARISON:  None. FINDINGS: Brain: No acute infarction, hydrocephalus, extra-axial collection or mass lesion. There multiple punctate foci of susceptibility  hypointensity scattered diffusely throughout the brain compatible with hemosiderin deposition of chronic microhemorrhage. Several nonspecific foci of T2 FLAIR hyperintense signal abnormality in subcortical and periventricular white matter are compatible with mild chronic microvascular ischemic changes for age. Mild brain parenchymal volume loss. Vascular: Normal flow voids. Skull and upper cervical spine: Normal marrow signal. Sinuses/Orbits: Negative. Other: None. IMPRESSION: 1. No acute intracranial abnormality identified. 2. Mild for age chronic microvascular ischemic changes and parenchymal volume loss of the brain. 3. Multiple diffuse nonspecific punctate foci of chronic microhemorrhage in the brain may be due to sequelae of thrombocytopenia/coagulopathy, hypertension, or amyloid vasculopathy. Electronically Signed   By: Kristine Garbe M.D.   On: 06/27/2017 20:56   US Abdomen Limited  Result Date: 07/20/2017 CLINICAL DATA:  Abdominal distention.  Evaluate for ascites EXAM: LIMITED ABDOMEN ULTRASOUND FOR ASCITES TECHNIQUE: Limited ultrasound survey for ascites was performed in all four abdominal quadrants. COMPARISON:  None. FINDINGS: Moderate to large volume ascites noted in all 4 quadrants of the abdomen and pelvis. IMPRESSION: Moderate to large volume ascites. Electronically Signed   By: Rolm Baptise M.D.   On: 07/20/2017 10:50   Dg Chest Port 1 View  Result Date: 07/10/2017 CLINICAL DATA:  Shortness of breath and tachycardia EXAM: PORTABLE CHEST 1 VIEW COMPARISON:  06/23/2017 FINDINGS: Cardiac shadow remains enlarged. Postsurgical changes are again seen. Right chest wall port is again noted and stable. Persistent vascular congestion and mild interstitial edema is seen. Mild left basilar atelectasis is noted. No bony abnormality is seen. IMPRESSION: Changes of mild CHF. Electronically Signed   By: Inez Catalina M.D.   On: 07/10/2017 12:18   Ir Exchange Biliary Drain  Result Date:  07/11/2017 INDICATION: Chronic cholecystitis, retracted occluded cholecystostomy EXAM: FLUOROSCOPIC CHOLECYSTOSTOMY REPOSITION AND EXCHANGE MEDICATIONS: 1% LIDOCAINE LOCAL ANESTHESIA/SEDATION: Moderate Sedation Time:  None. The patient was continuously monitored during the procedure by the interventional radiology nurse under my direct supervision. COMPLICATIONS: None immediate. PROCEDURE: Informed written consent was obtained from the patient after a thorough discussion of the procedural risks, benefits and alternatives. All questions were addressed. Maximal Sterile Barrier Technique was utilized including caps, mask, sterile gowns, sterile gloves, sterile drape, hand hygiene and skin antiseptic. A timeout was performed prior to the initiation of the procedure. Initially, the cholecystostomy catheter was injected with contrast. The catheter is partially occluded and nearly retracted into the percutaneous tract. Under sterile conditions and local anesthesia, the catheter was cut and removed over an Amplatz guidewire. A new 10 French catheter was advanced with the retention loop formed in the gallbladder.  Position confirmed with contrast injection. Images obtained for documentation. Gallbladder was decompressed by syringe aspiration. Catheter secured with a prolene suture and a sterile dressing. Gravity drainage bag connected. IMPRESSION: Successful fluoroscopic exchange and reposition of the cholecystostomy. Electronically Signed   By: Jerilynn Mages.  Shick M.D.   On: 07/11/2017 14:53   Ir Paracentesis  Result Date: 07/21/2017 INDICATION: Recurrent ascites EXAM: ULTRASOUND-GUIDED PARACENTESIS COMPARISON:  Previous paracentesis. MEDICATIONS: 10 cc 1% lidocaine. COMPLICATIONS: None immediate. TECHNIQUE: Informed written consent was obtained from the patient after a discussion of the risks, benefits and alternatives to treatment. A timeout was performed prior to the initiation of the procedure. Initial ultrasound scanning  demonstrates a large amount of ascites within the right lower abdominal quadrant. The right lower abdomen was prepped and draped in the usual sterile fashion. 1% lidocaine with epinephrine was used for local anesthesia. Under direct ultrasound guidance, a 19 gauge, 7-cm, Yueh catheter was introduced. An ultrasound image was saved for documentation purposed. the paracentesis was performed. The catheter was removed and a dressing was applied. The patient tolerated the procedure well without immediate post procedural complication. FINDINGS: A total of approximately 1.3 liters of bright yellow fluid was removed. IMPRESSION: Successful ultrasound-guided paracentesis yielding 1.3 liters of peritoneal fluid. Read by Lavonia Drafts [facility] Electronically Signed   By: Aletta Edouard M.D.   On: 07/21/2017 16:32    Microbiology: No results found for this or any previous visit (from the past 240 hour(s)).   Labs: Basic Metabolic Panel: Recent Labs  Lab 07/17/17 0430 07/18/17 0500 07/19/17 0500  NA 131* 131* 131*  K 4.2 3.6 3.7  CL 91* 94* 92*  CO2 29 29 26   GLUCOSE 126* 107* 118*  BUN 20 17 19   CREATININE 0.72 0.66 0.74  CALCIUM 9.5 9.3 9.7   Liver Function Tests: Recent Labs  Lab 07/19/17 0500  AST 78*  ALT 7*  ALKPHOS 97  BILITOT 7.0*  PROT 6.1*  ALBUMIN 2.8*   No results for input(s): LIPASE, AMYLASE in the last 168 hours. No results for input(s): AMMONIA in the last 168 hours. CBC: Recent Labs  Lab 07/17/17 0430 07/18/17 0500 07/19/17 0500  07/20/17 0500 07/20/17 1242 07/20/17 1742 07/21/17 1235 07/22/17 0500  WBC 8.5 7.3 9.5  --   --   --   --   --   --   NEUTROABS  --   --  7.9*  --   --   --   --   --   --   HGB 8.7* 7.9* 8.8*   < > 7.6* 8.0* 8.1* 7.8* 8.4*  HCT 28.5* 26.1* 29.2*   < > 25.1* 26.7* 26.4* 25.8* 27.8*  MCV 99.0 97.0 98.3  --   --   --   --   --   --   PLT 142* 135* 205  --   --   --   --   --   --    < > = values in this interval not displayed.   Cardiac  Enzymes: No results for input(s): CKTOTAL, CKMB, CKMBINDEX, TROPONINI in the last 168 hours. BNP: BNP (last 3 results) Recent Labs    11/18/16 2330 06/23/17 0253 07/10/17 1156  BNP 268.9* 1,197.3* 2,496.9*    ProBNP (last 3 results) No results for input(s): PROBNP in the last 8760 hours.  CBG: Recent Labs  Lab 07/22/17 0746  GLUCAP 100*       Signed:  Cristal Ford  [facility] 07/23/2017, 10:32 AM

## 2017-07-25 DIAGNOSIS — D630 Anemia in neoplastic disease: Secondary | ICD-10-CM | POA: Diagnosis not present

## 2017-07-25 DIAGNOSIS — K219 Gastro-esophageal reflux disease without esophagitis: Secondary | ICD-10-CM | POA: Diagnosis not present

## 2017-07-25 DIAGNOSIS — E1159 Type 2 diabetes mellitus with other circulatory complications: Secondary | ICD-10-CM | POA: Diagnosis not present

## 2017-07-25 DIAGNOSIS — I359 Nonrheumatic aortic valve disorder, unspecified: Secondary | ICD-10-CM | POA: Diagnosis not present

## 2017-07-25 DIAGNOSIS — R609 Edema, unspecified: Secondary | ICD-10-CM | POA: Diagnosis not present

## 2017-07-25 DIAGNOSIS — K811 Chronic cholecystitis: Secondary | ICD-10-CM | POA: Diagnosis not present

## 2017-07-25 DIAGNOSIS — G20 Parkinson's disease: Secondary | ICD-10-CM | POA: Diagnosis not present

## 2017-07-25 DIAGNOSIS — D599 Acquired hemolytic anemia, unspecified: Secondary | ICD-10-CM | POA: Diagnosis not present

## 2017-07-25 DIAGNOSIS — E785 Hyperlipidemia, unspecified: Secondary | ICD-10-CM | POA: Diagnosis not present

## 2017-07-25 DIAGNOSIS — G4733 Obstructive sleep apnea (adult) (pediatric): Secondary | ICD-10-CM | POA: Diagnosis not present

## 2017-07-25 DIAGNOSIS — I10 Essential (primary) hypertension: Secondary | ICD-10-CM | POA: Diagnosis not present

## 2017-07-25 DIAGNOSIS — K589 Irritable bowel syndrome without diarrhea: Secondary | ICD-10-CM | POA: Diagnosis not present

## 2017-07-25 DIAGNOSIS — C9000 Multiple myeloma not having achieved remission: Secondary | ICD-10-CM | POA: Diagnosis not present

## 2017-07-25 DIAGNOSIS — F339 Major depressive disorder, recurrent, unspecified: Secondary | ICD-10-CM | POA: Diagnosis not present

## 2017-07-25 DIAGNOSIS — N401 Enlarged prostate with lower urinary tract symptoms: Secondary | ICD-10-CM | POA: Diagnosis not present

## 2017-07-25 DIAGNOSIS — I5030 Unspecified diastolic (congestive) heart failure: Secondary | ICD-10-CM | POA: Diagnosis not present

## 2017-07-25 DIAGNOSIS — M109 Gout, unspecified: Secondary | ICD-10-CM | POA: Diagnosis not present

## 2017-07-26 ENCOUNTER — Telehealth: Payer: Self-pay | Admitting: Cardiology

## 2017-07-26 DIAGNOSIS — K811 Chronic cholecystitis: Secondary | ICD-10-CM | POA: Diagnosis not present

## 2017-07-26 DIAGNOSIS — I5030 Unspecified diastolic (congestive) heart failure: Secondary | ICD-10-CM | POA: Diagnosis not present

## 2017-07-26 DIAGNOSIS — D630 Anemia in neoplastic disease: Secondary | ICD-10-CM | POA: Diagnosis not present

## 2017-07-26 DIAGNOSIS — D599 Acquired hemolytic anemia, unspecified: Secondary | ICD-10-CM | POA: Diagnosis not present

## 2017-07-26 DIAGNOSIS — C9000 Multiple myeloma not having achieved remission: Secondary | ICD-10-CM | POA: Diagnosis not present

## 2017-07-26 DIAGNOSIS — I359 Nonrheumatic aortic valve disorder, unspecified: Secondary | ICD-10-CM | POA: Diagnosis not present

## 2017-07-26 NOTE — Progress Notes (Deleted)
HPI: FU AVR. Previous AVR in 2006 with a porcine valve. Echocardiogram 11/29/11: Moderate LVH, EF 12-87%, grade 1 diastolic dysfunction, critical aortic stenosis with a mean gradient of 80 mmHg. LHC 11/28/11: Normal coronary arteries, severe aortic stenosis. Patient underwent redo aortic valve replacement with a pericardial tissue valve 12/06/11 with Dr. Roxy Manns. Abdominal ultrasound January 2017 showed no aneurysm. Patient has had problems with GI blood loss and is being treated for multiple myeloma. He has required multiple blood transfusions. ATEE was performed on 10/27/2015 to see if hemolysis from his valve may be contributing. This revealed normal LV systolic function. There was a bioprosthetic aortic valve with elevated mean gradient of 31 mmHg and moderate AI that was paravalvular. There was mild mitral regurgitation, mild biatrial enlargement and mild right ventricular enlargement. I reviewed the patient with Dr. Roxy Manns. He would be high risk for redo aortic valve replacement giving his comorbidities and this would be his third valve replacement. I discussed the patient with Dr. Norma Fredrickson at [facility] who is a member of the hematology oncology department. He felt anemia was multifactorial including hemolysis, myeloma and predominantly GI blood loss. He was being evaluated for GI source and if hemolysis thought to be the major issue in the future we could consider referral to Dr. Albertine Patricia in [facility] closure of his paravalvular leak. 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 Patient found to be in atrial flutter at previousoffice visit. Holter 7/18 showed atrial flutter rate controlled. Multiple recent admissions for CHF and anemia.  Last echocardiogram at [facility] in April 2019 showed normal LV function, mild right ventricular enlargement, bioprosthetic aortic valve with elevated mean gradient of 42 mmHg, mild mitral regurgitation, moderate tricuspid  regurgitation and moderately elevated pulmonary pressure.  Patient just discharged following admission for respiratory failure, symptomatic anemia and acute on chronic diastolic congestive heart failure.  Also with protein malnutrition.  Patient did undergo paracentesis.  Discharged with hospice care.  Since last seen,  Current Outpatient Medications  Medication Sig Dispense Refill  . acetaminophen (TYLENOL) 325 MG tablet Take 650 mg by mouth every 6 (six) hours as needed for fever.     Marland Kitchen alum & mag hydroxide-simeth (MAALOX/MYLANTA) 200-200-20 MG/5ML suspension Take 30 mLs by mouth every 4 (four) hours as needed for indigestion. 355 mL 0  . atropine 1 % ophthalmic solution Place 2 drops under the tongue 4 (four) times daily. 2 mL 12  . bisacodyl (DULCOLAX) 5 MG EC tablet Take 1 tablet (5 mg total) by mouth daily as needed for moderate constipation or severe constipation. 30 tablet 0  . carbidopa-levodopa (SINEMET IR) 25-100 MG tablet Take 1 tablet by mouth 3 (three) times daily.     . cyanocobalamin 500 MCG tablet Take 500 mcg by mouth 2 (two) times daily. Lunch/dinner    . diclofenac sodium (VOLTAREN) 1 % GEL Apply 2 g topically 4 (four) times daily as needed (back pain). 100 g 0  . docusate sodium (COLACE) 100 MG capsule Take 100 mg by mouth 2 (two) times daily as needed for moderate constipation.     . feeding supplement, ENSURE ENLIVE, (ENSURE ENLIVE) LIQD Take 237 mLs by mouth 3 (three) times daily between meals. 90 Bottle 0  . fentaNYL (DURAGESIC - DOSED MCG/HR) 50 MCG/HR Place 1 patch (50 mcg total) onto the skin every 3 (three) days. 5 patch 0  . finasteride (PROSCAR) 5 MG tablet Take 5 mg by mouth daily.    Marland Kitchen  fish oil-omega-3 fatty acids 1000 MG capsule Take 1 g by mouth daily.     . folic acid (FOLVITE) 1 MG tablet Take 1 mg by mouth daily.    . furosemide (LASIX) 40 MG tablet TAKE 2 TABLETS (80 MG TOTAL) BY MOUTH DAILY. TAKE A EXTRA TABLET ON DAYS OF DIALYSIS (Patient taking differently:  Take 80 mg by mouth 2 (two) times daily. Take 4 tablets on dialysis days) 100 tablet 2  . gabapentin (NEURONTIN) 800 MG tablet Take 400-800 mg by mouth See admin instructions. Take 1/2 tablet every morning and at bedtime then take 1 tablet at lunch and dinner    . metFORMIN (GLUCOPHAGE) 500 MG tablet Take 500 mg by mouth 2 (two) times daily with a meal.    . midodrine (PROAMATINE) 5 MG tablet Take 3 tablets (15 mg total) by mouth 3 (three) times daily with meals. 90 tablet 0  . ondansetron (ZOFRAN) 4 MG tablet Take 4 mg by mouth every 8 (eight) hours as needed for nausea or vomiting.    Marland Kitchen oxyCODONE (OXY IR/ROXICODONE) 5 MG immediate release tablet Take 1-2 tablets (5-10 mg total) by mouth every 4 (four) hours as needed (breakthrough pain/dyspnea). 30 tablet 0  . pantoprazole (PROTONIX) 40 MG tablet Take 1 tablet (40 mg total) by mouth 2 (two) times daily. For GERD 60 tablet 0  . polyethylene glycol (MIRALAX / GLYCOLAX) packet Take 17 g by mouth 2 (two) times daily. (Patient not taking: Reported on 07/10/2017) 14 each 0  . polyvinyl alcohol (LIQUIFILM TEARS) 1.4 % ophthalmic solution Place 1 drop into both eyes 2 (two) times daily as needed for dry eyes. 15 mL 0  . Polyvinyl Alcohol-Povidone PF (REFRESH) 1.4-0.6 % SOLN Place 1 drop into both eyes 2 (two) times daily as needed (dry eyes).    . simethicone (MYLICON) 80 MG chewable tablet Chew 1 tablet (80 mg total) by mouth 4 (four) times daily as needed (as needed for gas). 30 tablet 0  . simvastatin (ZOCOR) 10 MG tablet Take 10 mg by mouth at bedtime.     . traZODone (DESYREL) 50 MG tablet Take 50 mg by mouth at bedtime as needed for sleep.     Marland Kitchen UNABLE TO FIND Place 1 application into the right eye daily as needed (eye care). Ocular Ointment     . zinc oxide 20 % ointment Apply 1 application topically 2 (two) times daily as needed for irritation.     No current facility-administered medications for this visit.      Past Medical History:  Diagnosis  Date  . Allergic rhinitis   . Anemia 2009  . Aortic stenosis    a. s/p porcine AVR 2006;  b. s/p redo tissue AVR 12/2011 (Dr. Roxy Manns) - preAVR LHC with no CAD  . Asthma   . BPH (benign prostatic hypertrophy)   . Cancer ([place])    multiple myeloma  . Cataract   . Chronic cough   . Chronic interstitial cystitis   . Depression    pt denies  . Diabetes mellitus    type 2  . Diabetes mellitus without complication ([place])   . Diverticulosis of colon    on colonoscopy 2008  . GERD (gastroesophageal reflux disease)    bravo pH study 2008  . Gout   . H/O aortic valve replacement with porcine valve    2006, 2013  . Headache(784.0)   . Hemorrhoids    external and internal  . Hiatal hernia   .  HTN (hypertension)   . Hx of echocardiogram    a. Echo  (post AVR) 12/2011:  mod LVH, EF 60-65%, Gr 2 diast dysfn, mild AI, AVR ok (mean gradient 19 mmHg), MAC, mild BAE  . Hyperlipidemia   . Hypertension   . Hyperthyroidism   . IBS (irritable bowel syndrome)   . Insomnia   . Lower GI bleed 07/16/2015  . Multiple myeloma ([place])   . OA (osteoarthritis)   . OSA (obstructive sleep apnea)    USES CPAP AS NEEDED  . 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 Periodontitis    chronic with bone loss  . Restless leg syndrome   . S/P aortic valve replacement with bioprosthetic valve 12/06/2011   Redo AVR using 23 mm [facility] Ease pericardial tissue valve    Past Surgical History:  Procedure Laterality Date  . AORTA - FEMORAL ARTERY BYPASS GRAFT    . AORTIC VALVE REPLACEMENT  10/13/2004   41m Edwards Perimount pericardial tissue valve  . AORTIC VALVE REPLACEMENT  12/06/2011   Procedure: REDO AORTIC VALVE REPLACEMENT (AVR);  Surgeon: CRexene Alberts MD;  [place] [facility]  Service: Open Heart Surgery;  Laterality: N/A;  . BUNIONECTOMY     right  . CARDIAC SURGERY     aorta vavle replacement  . CATARACT EXTRACTION  2009    &   2012   BILATERAL  . COLONOSCOPY  08/31/2011    Procedure: COLONOSCOPY;  Surgeon: RInda Castle MD;  [place] [facility]  Service: Endoscopy;  Laterality: N/A;  . ESOPHAGOGASTRODUODENOSCOPY  08/30/2011   Procedure: ESOPHAGOGASTRODUODENOSCOPY (EGD);  Surgeon: RInda Castle MD;  [place] [facility]  Service: Endoscopy;  Laterality: N/A;  Rm 3005   . HERNIA REPAIR    . IR EXCHANGE BILIARY DRAIN  07/11/2017  . IR PARACENTESIS  07/21/2017  . LEFT HEART CATHETERIZATION WITH CORONARY ANGIOGRAM N/A 11/30/2011   Procedure: LEFT HEART CATHETERIZATION WITH CORONARY ANGIOGRAM;  Surgeon: CBurnell Blanks MD;  [facility];  Service: Cardiovascular;  Laterality: N/A;  . NASAL SEPTOPLASTY W/ TURBINOPLASTY    . REFRACTIVE SURGERY     bilateral  . RIGHT HEART CATHETERIZATION Bilateral 11/30/2011   Procedure: RIGHT HEART CATH;  Surgeon: CBurnell Blanks MD;  [facility];  Service: Cardiovascular;  Laterality: Bilateral;  . right knee arthroscopy    . TEE WITHOUT CARDIOVERSION N/A 10/27/2015   Procedure: TRANSESOPHAGEAL ECHOCARDIOGRAM (TEE);  Surgeon: BLelon Perla MD;  [facility];  Service: Cardiovascular;  Laterality: N/A;    Social History   Socioeconomic History  . Marital status: Married    Spouse name: Not on file  . Number of children: 0  . Years of education: Not on file  . Highest education level: Not on file  Occupational History  . Occupation: retired  SScientific laboratory technician . Financial resource strain: Not on file  . Food insecurity:    Worry: Not on file    Inability: Not on file  . Transportation needs:    Medical: Not on file    Non-medical: Not on file  Tobacco Use  . Smoking status: Former Smoker    Last attempt to quit: 09/30/1971    Years since quitting: 45.8  . Smokeless tobacco: Never Used  . Tobacco comment: Quit August 973  Substance and Sexual Activity  . Alcohol use: No  . Drug use: No  . Sexual activity: Not Currently  Lifestyle  . Physical activity:  Days  per week: Not on file    Minutes per session: Not on file  . Stress: Not on file  Relationships  . Social connections:    Talks on phone: Not on file    Gets together: Not on file    Attends religious service: Not on file    Active member of club or organization: Not on file    Attends meetings of clubs or organizations: Not on file    Relationship status: Not on file  . Intimate partner violence:    Fear of current or ex partner: Not on file    Emotionally abused: Not on file    Physically abused: Not on file    Forced sexual activity: Not on file  Other Topics Concern  . Not on file  Social History Narrative   ** Merged History Encounter **        Family History  Problem Relation Age of Onset  . Heart disease Father   . Osteoarthritis Mother   . Hypertension Sister   . Hyperlipidemia Unknown   . Cancer Maternal Aunt     ROS: no fevers or chills, productive cough, hemoptysis, dysphasia, odynophagia, melena, hematochezia, dysuria, hematuria, rash, seizure activity, orthopnea, PND, pedal edema, claudication. Remaining systems are negative.  Physical Exam: Well-developed well-nourished in no acute distress.  Skin is warm and dry.  HEENT is normal.  Neck is supple.  Chest is clear to auscultation with normal expansion.  Cardiovascular exam is regular rate and rhythm.  Abdominal exam nontender or distended. No masses palpated. Extremities show no edema. neuro grossly intact  ECG- personally reviewed  A/P  1  Kirk Ruths, MD

## 2017-07-26 NOTE — Telephone Encounter (Signed)
New Message:     Wife wanted you to know pt will not be coming for appointment tomorrow. Pt is now under Hospice Care.

## 2017-07-26 NOTE — Telephone Encounter (Signed)
Spoke with pt wife, they do not need anything at this time but prayer. They will call if needed.

## 2017-07-27 ENCOUNTER — Ambulatory Visit: Payer: Medicare Other | Admitting: Cardiology

## 2017-07-29 DIAGNOSIS — I5030 Unspecified diastolic (congestive) heart failure: Secondary | ICD-10-CM | POA: Diagnosis not present

## 2017-07-29 DIAGNOSIS — K811 Chronic cholecystitis: Secondary | ICD-10-CM | POA: Diagnosis not present

## 2017-07-29 DIAGNOSIS — D599 Acquired hemolytic anemia, unspecified: Secondary | ICD-10-CM | POA: Diagnosis not present

## 2017-07-29 DIAGNOSIS — D630 Anemia in neoplastic disease: Secondary | ICD-10-CM | POA: Diagnosis not present

## 2017-07-29 DIAGNOSIS — I359 Nonrheumatic aortic valve disorder, unspecified: Secondary | ICD-10-CM | POA: Diagnosis not present

## 2017-07-29 DIAGNOSIS — C9000 Multiple myeloma not having achieved remission: Secondary | ICD-10-CM | POA: Diagnosis not present

## 2017-08-07 DEATH — deceased

## 2018-09-10 IMAGING — DX DG CHEST 2V
2 series · 2 of 2 positions shown · non-contrast
Comparison: Chest CT and chest radiographs, 08/19/2016

CLINICAL DATA: Pt reports shortness of breath onset around 1411
DM and aortic valve replacement 5818

EXAM:
CHEST  2 VIEW

[chest pa]
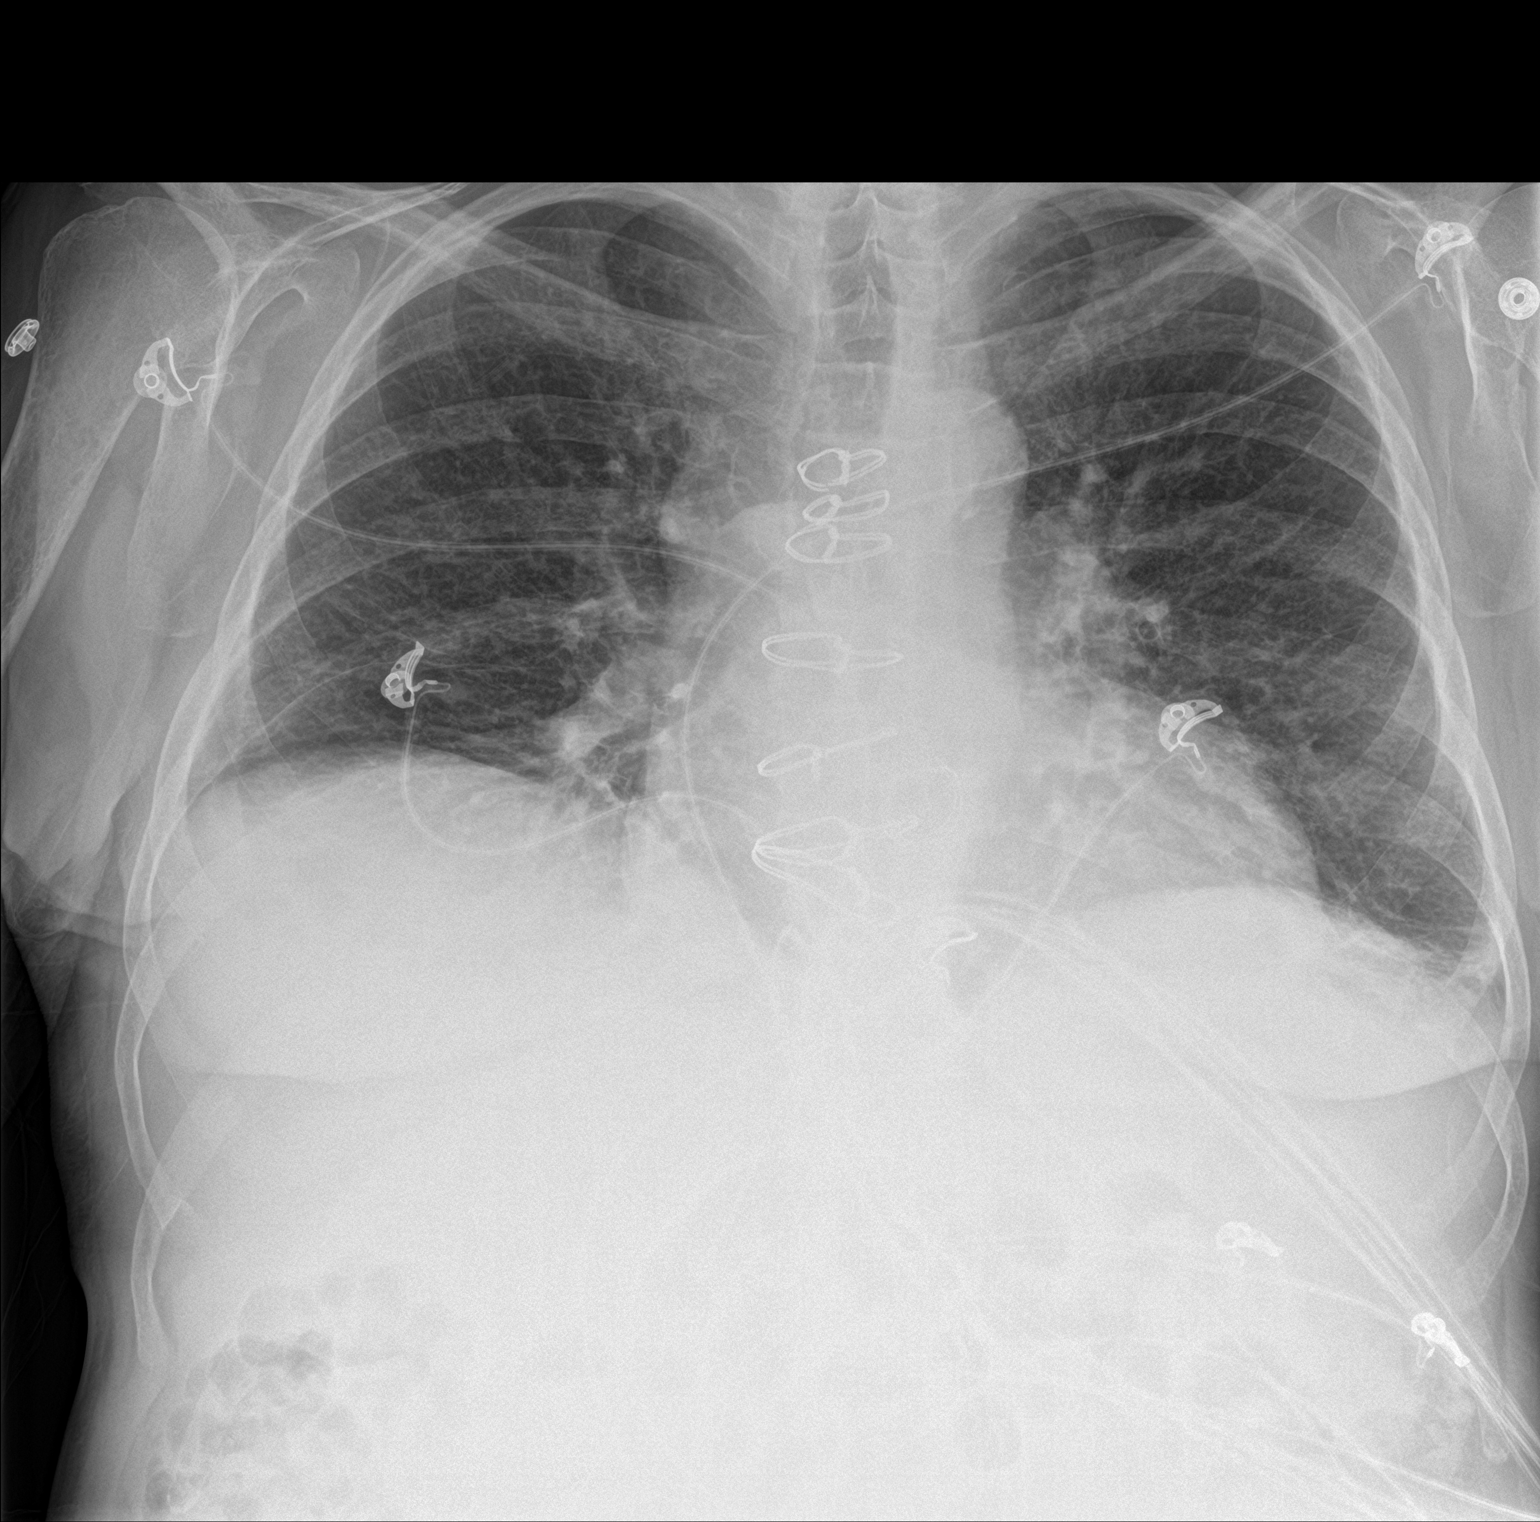

[chest lat]
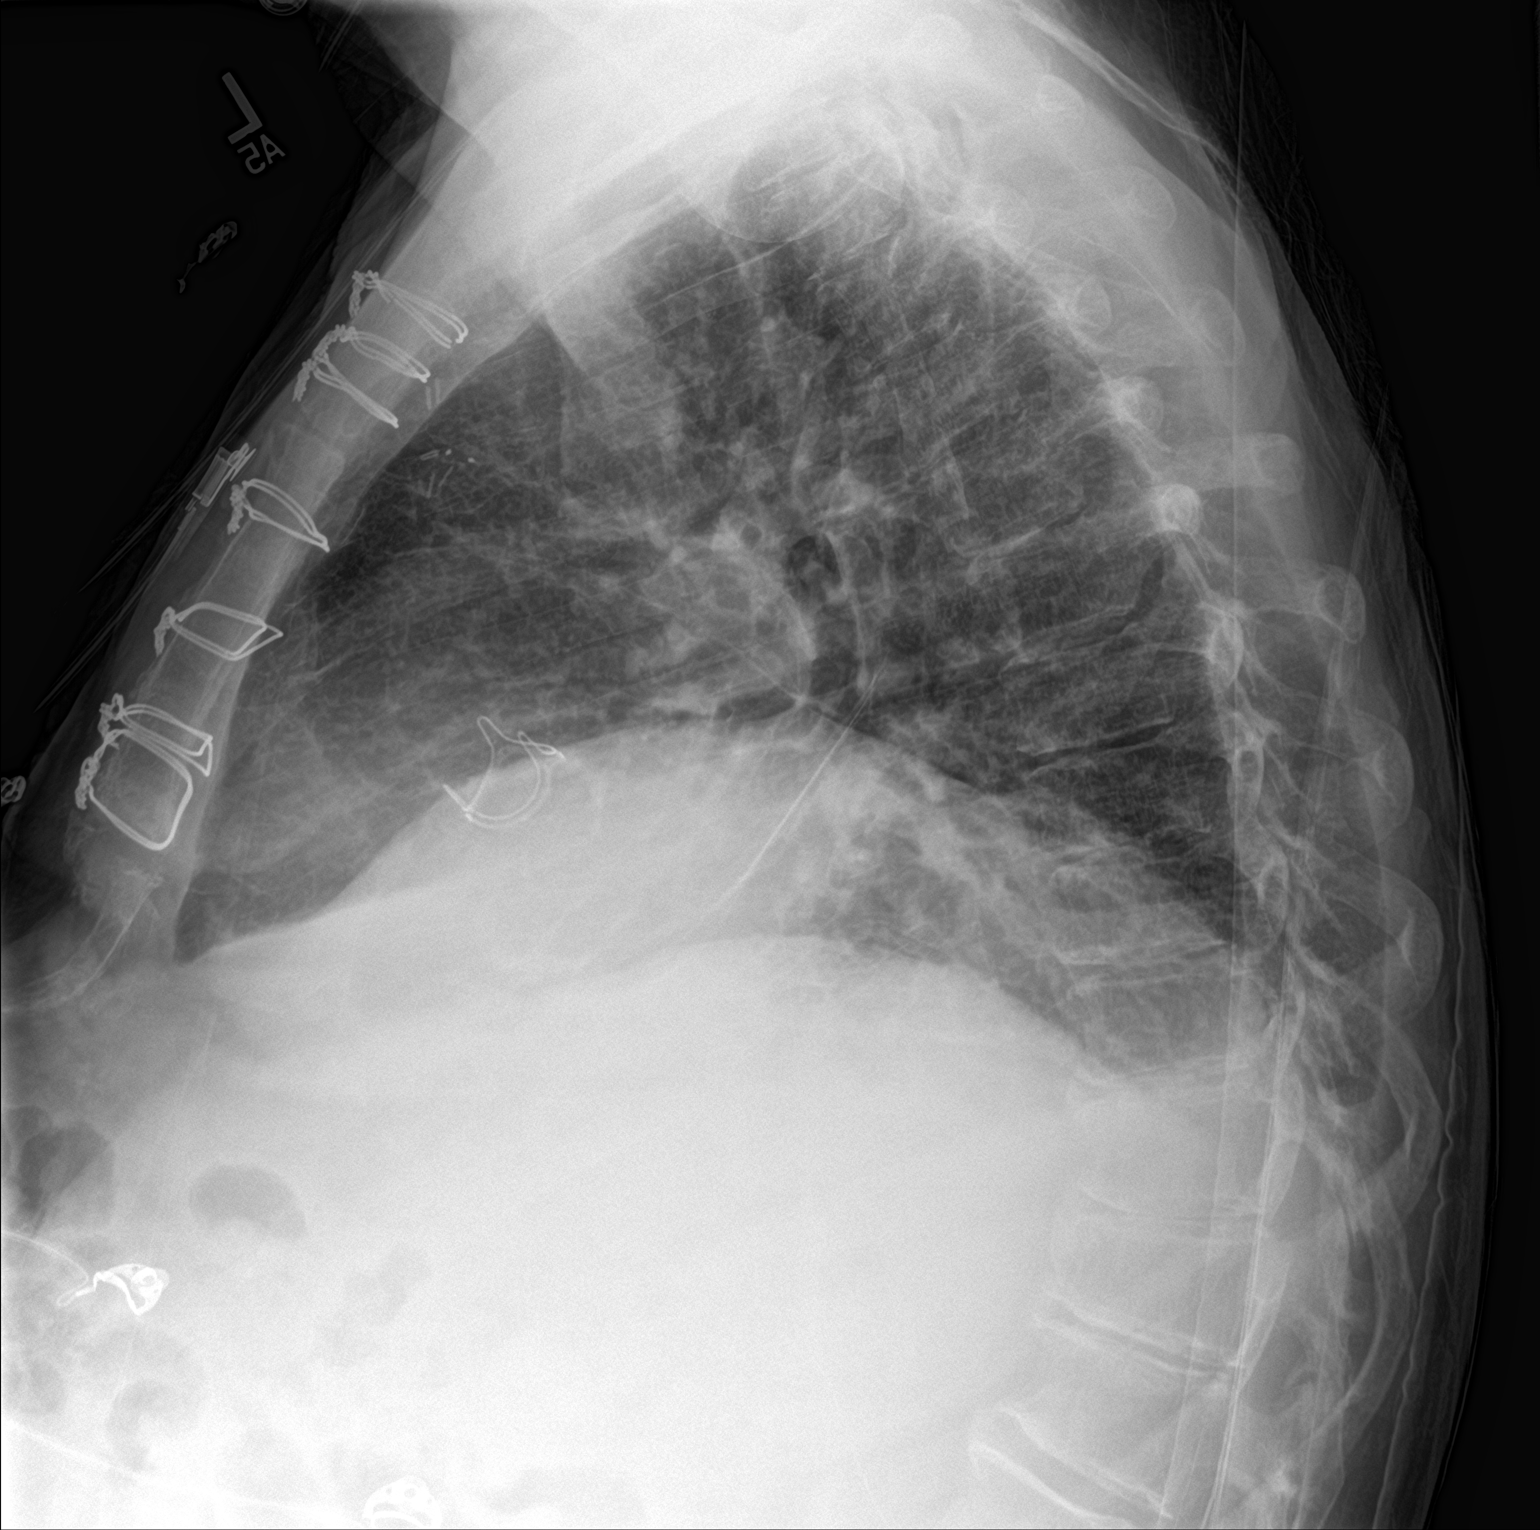

[2 of 2 positions shown; findings below may reference images not displayed]

FINDINGS: Changes from cardiac surgery are stable. Cardiac silhouette is
normal in size and configuration. No mediastinal or hilar masses. No
evidence of adenopathy.

Lung volumes are low. Mild linear lung base opacity is noted
consistent scarring or atelectasis, stable. Lungs are otherwise
clear.

No pleural effusion or pneumothorax.

Skeletal structures are demineralized but grossly intact.
IMPRESSION: No acute cardiopulmonary disease.
